# Patient Record
Sex: Female | Born: 1985
Health system: Southern US, Community
[De-identification: ages and names within clinical notes are randomized; demographics above are authoritative.]

## PROBLEM LIST (undated history)

## (undated) DIAGNOSIS — G35D Multiple sclerosis, unspecified: Secondary | ICD-10-CM

## (undated) DIAGNOSIS — G35 Multiple sclerosis: Secondary | ICD-10-CM

## (undated) DIAGNOSIS — G43909 Migraine, unspecified, not intractable, without status migrainosus: Secondary | ICD-10-CM

## (undated) DIAGNOSIS — T8859XA Other complications of anesthesia, initial encounter: Secondary | ICD-10-CM

## (undated) DIAGNOSIS — Z21 Asymptomatic human immunodeficiency virus [HIV] infection status: Secondary | ICD-10-CM

## (undated) DIAGNOSIS — O139 Gestational [pregnancy-induced] hypertension without significant proteinuria, unspecified trimester: Secondary | ICD-10-CM

## (undated) DIAGNOSIS — F329 Major depressive disorder, single episode, unspecified: Secondary | ICD-10-CM

## (undated) DIAGNOSIS — Z8669 Personal history of other diseases of the nervous system and sense organs: Secondary | ICD-10-CM

## (undated) DIAGNOSIS — F32A Depression, unspecified: Secondary | ICD-10-CM

## (undated) DIAGNOSIS — R519 Headache, unspecified: Secondary | ICD-10-CM

## (undated) DIAGNOSIS — B009 Herpesviral infection, unspecified: Secondary | ICD-10-CM

## (undated) DIAGNOSIS — R51 Headache: Secondary | ICD-10-CM

## (undated) DIAGNOSIS — B20 Human immunodeficiency virus [HIV] disease: Secondary | ICD-10-CM

## (undated) DIAGNOSIS — T4145XA Adverse effect of unspecified anesthetic, initial encounter: Secondary | ICD-10-CM

## (undated) HISTORY — DX: Other complications of anesthesia, initial encounter: T88.59XA

## (undated) HISTORY — DX: Adverse effect of unspecified anesthetic, initial encounter: T41.45XA

## (undated) HISTORY — DX: Migraine, unspecified, not intractable, without status migrainosus: G43.909

## (undated) HISTORY — DX: Depression, unspecified: F32.A

## (undated) HISTORY — PX: TUBAL LIGATION: SHX77

## (undated) HISTORY — PX: BREAST SURGERY: SHX581

## (undated) HISTORY — DX: Major depressive disorder, single episode, unspecified: F32.9

## (undated) HISTORY — DX: Herpesviral infection, unspecified: B00.9

## (undated) HISTORY — DX: Gestational (pregnancy-induced) hypertension without significant proteinuria, unspecified trimester: O13.9

## (undated) HISTORY — DX: Personal history of other diseases of the nervous system and sense organs: Z86.69

---
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
## 2003-02-08 ENCOUNTER — Encounter: Admission: RE | Admit: 2003-02-08 | Discharge: 2003-02-08 | Payer: Self-pay | Admitting: Family Medicine

## 2003-03-09 ENCOUNTER — Encounter: Admission: RE | Admit: 2003-03-09 | Discharge: 2003-03-09 | Payer: Self-pay | Admitting: Obstetrics and Gynecology

## 2003-03-10 ENCOUNTER — Ambulatory Visit (HOSPITAL_COMMUNITY): Admission: RE | Admit: 2003-03-10 | Discharge: 2003-03-10 | Payer: Self-pay | Admitting: Obstetrics and Gynecology

## 2003-04-03 ENCOUNTER — Encounter (INDEPENDENT_AMBULATORY_CARE_PROVIDER_SITE_OTHER): Payer: Self-pay | Admitting: Specialist

## 2003-04-03 ENCOUNTER — Ambulatory Visit (HOSPITAL_COMMUNITY): Admission: RE | Admit: 2003-04-03 | Discharge: 2003-04-03 | Payer: Self-pay | Admitting: Obstetrics and Gynecology

## 2003-04-25 ENCOUNTER — Encounter: Admission: RE | Admit: 2003-04-25 | Discharge: 2003-04-25 | Payer: Self-pay | Admitting: Obstetrics and Gynecology

## 2003-06-24 HISTORY — PX: IUD REMOVAL: SHX5392

## 2003-08-05 ENCOUNTER — Emergency Department (HOSPITAL_COMMUNITY): Admission: AD | Admit: 2003-08-05 | Discharge: 2003-08-05 | Payer: Self-pay | Admitting: Family Medicine

## 2003-12-08 ENCOUNTER — Emergency Department (HOSPITAL_COMMUNITY): Admission: EM | Admit: 2003-12-08 | Discharge: 2003-12-08 | Payer: Self-pay | Admitting: Emergency Medicine

## 2004-06-28 ENCOUNTER — Emergency Department: Payer: Self-pay | Admitting: Internal Medicine

## 2004-12-07 ENCOUNTER — Emergency Department (HOSPITAL_COMMUNITY): Admission: EM | Admit: 2004-12-07 | Discharge: 2004-12-07 | Payer: Self-pay | Admitting: Emergency Medicine

## 2005-10-22 ENCOUNTER — Emergency Department (HOSPITAL_COMMUNITY): Admission: EM | Admit: 2005-10-22 | Discharge: 2005-10-22 | Payer: Self-pay | Admitting: Family Medicine

## 2005-10-24 ENCOUNTER — Other Ambulatory Visit: Admission: RE | Admit: 2005-10-24 | Discharge: 2005-10-24 | Payer: Self-pay | Admitting: Family Medicine

## 2005-10-24 ENCOUNTER — Ambulatory Visit: Payer: Self-pay | Admitting: Family Medicine

## 2005-10-29 ENCOUNTER — Ambulatory Visit: Payer: Self-pay | Admitting: Family Medicine

## 2005-10-30 ENCOUNTER — Encounter: Admission: RE | Admit: 2005-10-30 | Discharge: 2005-10-30 | Payer: Self-pay | Admitting: Family Medicine

## 2005-12-13 ENCOUNTER — Emergency Department (HOSPITAL_COMMUNITY): Admission: EM | Admit: 2005-12-13 | Discharge: 2005-12-13 | Payer: Self-pay | Admitting: Emergency Medicine

## 2006-08-20 DIAGNOSIS — E669 Obesity, unspecified: Secondary | ICD-10-CM | POA: Insufficient documentation

## 2006-08-20 DIAGNOSIS — E739 Lactose intolerance, unspecified: Secondary | ICD-10-CM | POA: Insufficient documentation

## 2006-08-21 ENCOUNTER — Encounter (INDEPENDENT_AMBULATORY_CARE_PROVIDER_SITE_OTHER): Payer: Self-pay | Admitting: *Deleted

## 2006-12-04 ENCOUNTER — Telehealth: Payer: Self-pay | Admitting: *Deleted

## 2006-12-10 ENCOUNTER — Encounter (INDEPENDENT_AMBULATORY_CARE_PROVIDER_SITE_OTHER): Payer: Self-pay | Admitting: Family Medicine

## 2006-12-10 ENCOUNTER — Ambulatory Visit: Payer: Self-pay | Admitting: Sports Medicine

## 2006-12-10 LAB — CONVERTED CEMR LAB
Antibody Screen: NEGATIVE
Beta hcg, urine, semiquantitative: POSITIVE
Eosinophils Relative: 2 % (ref 0–5)
Hemoglobin: 12.3 g/dL (ref 12.0–15.0)
Hepatitis B Surface Ag: NEGATIVE
Lymphs Abs: 2.5 10*3/uL (ref 0.7–3.3)
MCHC: 33.1 g/dL (ref 30.0–36.0)
Monocytes Absolute: 0.7 10*3/uL (ref 0.2–0.7)
Monocytes Relative: 7 % (ref 3–11)
RDW: 15 % — ABNORMAL HIGH (ref 11.5–14.0)
Rubella: 68.9 intl units/mL — ABNORMAL HIGH
WBC: 10.9 10*3/uL — ABNORMAL HIGH (ref 4.0–10.5)

## 2006-12-30 ENCOUNTER — Encounter (INDEPENDENT_AMBULATORY_CARE_PROVIDER_SITE_OTHER): Payer: Self-pay | Admitting: Family Medicine

## 2006-12-30 ENCOUNTER — Ambulatory Visit: Payer: Self-pay | Admitting: Family Medicine

## 2006-12-30 LAB — CONVERTED CEMR LAB: Chlamydia, DNA Probe: NEGATIVE

## 2007-01-06 ENCOUNTER — Encounter: Payer: Self-pay | Admitting: Family Medicine

## 2007-01-06 ENCOUNTER — Ambulatory Visit (HOSPITAL_COMMUNITY): Admission: RE | Admit: 2007-01-06 | Discharge: 2007-01-06 | Payer: Self-pay | Admitting: Family Medicine

## 2007-01-27 ENCOUNTER — Ambulatory Visit: Payer: Self-pay

## 2007-01-27 LAB — CONVERTED CEMR LAB
Glucose, Urine, Semiquant: NEGATIVE
Protein, U semiquant: NEGATIVE

## 2007-03-04 ENCOUNTER — Ambulatory Visit (HOSPITAL_COMMUNITY): Admission: RE | Admit: 2007-03-04 | Discharge: 2007-03-04 | Payer: Self-pay | Admitting: Family Medicine

## 2007-03-04 ENCOUNTER — Ambulatory Visit: Payer: Self-pay | Admitting: Family Medicine

## 2007-03-05 ENCOUNTER — Encounter (INDEPENDENT_AMBULATORY_CARE_PROVIDER_SITE_OTHER): Payer: Self-pay | Admitting: Family Medicine

## 2007-03-23 ENCOUNTER — Ambulatory Visit: Payer: Self-pay | Admitting: Family Medicine

## 2007-04-05 ENCOUNTER — Ambulatory Visit: Payer: Self-pay | Admitting: Family Medicine

## 2007-04-08 ENCOUNTER — Encounter (INDEPENDENT_AMBULATORY_CARE_PROVIDER_SITE_OTHER): Payer: Self-pay | Admitting: Family Medicine

## 2007-04-08 ENCOUNTER — Ambulatory Visit: Payer: Self-pay | Admitting: Family Medicine

## 2007-04-16 ENCOUNTER — Inpatient Hospital Stay (HOSPITAL_COMMUNITY): Admission: AD | Admit: 2007-04-16 | Discharge: 2007-04-17 | Payer: Self-pay | Admitting: Obstetrics & Gynecology

## 2007-04-16 ENCOUNTER — Ambulatory Visit: Payer: Self-pay | Admitting: Obstetrics and Gynecology

## 2007-04-29 ENCOUNTER — Ambulatory Visit: Payer: Self-pay | Admitting: Family Medicine

## 2007-04-29 LAB — CONVERTED CEMR LAB

## 2007-05-04 ENCOUNTER — Encounter (INDEPENDENT_AMBULATORY_CARE_PROVIDER_SITE_OTHER): Payer: Self-pay | Admitting: Family Medicine

## 2007-05-11 ENCOUNTER — Encounter (INDEPENDENT_AMBULATORY_CARE_PROVIDER_SITE_OTHER): Payer: Self-pay | Admitting: Family Medicine

## 2007-05-11 ENCOUNTER — Ambulatory Visit: Payer: Self-pay | Admitting: Family Medicine

## 2007-05-26 ENCOUNTER — Ambulatory Visit: Payer: Self-pay | Admitting: Surgery

## 2007-05-26 ENCOUNTER — Ambulatory Visit: Payer: Self-pay | Admitting: Family Medicine

## 2007-05-26 ENCOUNTER — Ambulatory Visit (HOSPITAL_COMMUNITY): Admission: RE | Admit: 2007-05-26 | Discharge: 2007-05-26 | Payer: Self-pay | Admitting: Sports Medicine

## 2007-05-26 ENCOUNTER — Encounter (INDEPENDENT_AMBULATORY_CARE_PROVIDER_SITE_OTHER): Payer: Self-pay | Admitting: *Deleted

## 2007-05-27 ENCOUNTER — Encounter (INDEPENDENT_AMBULATORY_CARE_PROVIDER_SITE_OTHER): Payer: Self-pay | Admitting: *Deleted

## 2007-05-27 LAB — CONVERTED CEMR LAB
BUN: 4 mg/dL — ABNORMAL LOW (ref 6–23)
CO2: 20 meq/L (ref 19–32)
Calcium: 9.1 mg/dL (ref 8.4–10.5)
Chloride: 105 meq/L (ref 96–112)
Creatinine, Ser: 0.5 mg/dL (ref 0.40–1.20)
Glucose, Bld: 72 mg/dL (ref 70–99)
Potassium: 4.1 meq/L (ref 3.5–5.3)

## 2007-06-03 ENCOUNTER — Encounter: Payer: Self-pay | Admitting: Family Medicine

## 2007-06-03 ENCOUNTER — Ambulatory Visit: Payer: Self-pay | Admitting: Family Medicine

## 2007-06-03 LAB — CONVERTED CEMR LAB
Basophils Absolute: 0 10*3/uL (ref 0.0–0.1)
Basophils Relative: 0 % (ref 0–1)
Eosinophils Absolute: 0 10*3/uL — ABNORMAL LOW (ref 0.2–0.7)
Eosinophils Relative: 0 % (ref 0–5)
Glucose, Urine, Semiquant: NEGATIVE
MCV: 83.5 fL (ref 78.0–100.0)
Monocytes Absolute: 1.1 10*3/uL — ABNORMAL HIGH (ref 0.1–1.0)
Monocytes Relative: 9 % (ref 3–12)
Neutrophils Relative %: 71 % (ref 43–77)
RDW: 15.3 % (ref 11.5–15.5)
WBC: 12.1 10*3/uL — ABNORMAL HIGH (ref 4.0–10.5)

## 2007-06-09 ENCOUNTER — Encounter (INDEPENDENT_AMBULATORY_CARE_PROVIDER_SITE_OTHER): Payer: Self-pay | Admitting: Family Medicine

## 2007-06-09 ENCOUNTER — Ambulatory Visit: Payer: Self-pay | Admitting: Family Medicine

## 2007-06-14 ENCOUNTER — Encounter (INDEPENDENT_AMBULATORY_CARE_PROVIDER_SITE_OTHER): Payer: Self-pay | Admitting: Family Medicine

## 2007-06-14 ENCOUNTER — Ambulatory Visit: Payer: Self-pay | Admitting: Family Medicine

## 2007-06-14 LAB — CONVERTED CEMR LAB
Blood Glucose, Fingerstick: 114
Protein, U semiquant: NEGATIVE

## 2007-06-18 ENCOUNTER — Ambulatory Visit: Payer: Self-pay | Admitting: Gynecology

## 2007-06-18 ENCOUNTER — Inpatient Hospital Stay (HOSPITAL_COMMUNITY): Admission: AD | Admit: 2007-06-18 | Discharge: 2007-06-18 | Payer: Self-pay | Admitting: Gynecology

## 2007-06-22 ENCOUNTER — Ambulatory Visit: Payer: Self-pay | Admitting: Family Medicine

## 2007-06-22 LAB — CONVERTED CEMR LAB: Blood Glucose, AC Bkfst: 99 mg/dL

## 2007-06-28 ENCOUNTER — Encounter: Payer: Self-pay | Admitting: Family Medicine

## 2007-06-28 ENCOUNTER — Encounter (INDEPENDENT_AMBULATORY_CARE_PROVIDER_SITE_OTHER): Payer: Self-pay | Admitting: Family Medicine

## 2007-06-28 DIAGNOSIS — B009 Herpesviral infection, unspecified: Secondary | ICD-10-CM | POA: Insufficient documentation

## 2007-06-28 HISTORY — DX: Herpesviral infection, unspecified: B00.9

## 2007-06-29 ENCOUNTER — Telehealth (INDEPENDENT_AMBULATORY_CARE_PROVIDER_SITE_OTHER): Payer: Self-pay | Admitting: *Deleted

## 2007-07-01 ENCOUNTER — Encounter (INDEPENDENT_AMBULATORY_CARE_PROVIDER_SITE_OTHER): Payer: Self-pay | Admitting: Family Medicine

## 2007-07-01 ENCOUNTER — Ambulatory Visit: Payer: Self-pay | Admitting: Family Medicine

## 2007-07-01 LAB — CONVERTED CEMR LAB
GC Culture Only: NEGATIVE
GC Probe Amp, Genital: NEGATIVE
Glucose, Urine, Semiquant: NEGATIVE

## 2007-07-02 ENCOUNTER — Telehealth (INDEPENDENT_AMBULATORY_CARE_PROVIDER_SITE_OTHER): Payer: Self-pay | Admitting: Family Medicine

## 2007-07-08 ENCOUNTER — Inpatient Hospital Stay (HOSPITAL_COMMUNITY): Admission: AD | Admit: 2007-07-08 | Discharge: 2007-07-08 | Payer: Self-pay | Admitting: Obstetrics & Gynecology

## 2007-07-09 ENCOUNTER — Ambulatory Visit (HOSPITAL_COMMUNITY): Admission: RE | Admit: 2007-07-09 | Discharge: 2007-07-09 | Payer: Self-pay | Admitting: Family Medicine

## 2007-07-09 ENCOUNTER — Ambulatory Visit: Payer: Self-pay | Admitting: Family Medicine

## 2007-07-12 ENCOUNTER — Telehealth: Payer: Self-pay | Admitting: *Deleted

## 2007-07-13 LAB — CONVERTED CEMR LAB: Strep B culture: NEGATIVE

## 2007-07-15 ENCOUNTER — Encounter: Payer: Self-pay | Admitting: *Deleted

## 2007-07-15 ENCOUNTER — Ambulatory Visit: Payer: Self-pay | Admitting: Family Medicine

## 2007-07-15 ENCOUNTER — Telehealth (INDEPENDENT_AMBULATORY_CARE_PROVIDER_SITE_OTHER): Payer: Self-pay | Admitting: Family Medicine

## 2007-07-16 ENCOUNTER — Encounter: Payer: Self-pay | Admitting: *Deleted

## 2007-07-18 ENCOUNTER — Inpatient Hospital Stay (HOSPITAL_COMMUNITY): Admission: AD | Admit: 2007-07-18 | Discharge: 2007-07-18 | Payer: Self-pay | Admitting: Obstetrics & Gynecology

## 2007-07-19 ENCOUNTER — Encounter: Payer: Self-pay | Admitting: Family Medicine

## 2007-07-23 ENCOUNTER — Ambulatory Visit: Payer: Self-pay | Admitting: Family Medicine

## 2007-07-25 ENCOUNTER — Ambulatory Visit: Payer: Self-pay | Admitting: Obstetrics & Gynecology

## 2007-07-25 ENCOUNTER — Inpatient Hospital Stay (HOSPITAL_COMMUNITY): Admission: AD | Admit: 2007-07-25 | Discharge: 2007-07-28 | Payer: Self-pay | Admitting: Obstetrics & Gynecology

## 2007-08-03 ENCOUNTER — Telehealth: Payer: Self-pay | Admitting: *Deleted

## 2007-09-10 ENCOUNTER — Ambulatory Visit: Payer: Self-pay | Admitting: Family Medicine

## 2007-09-10 LAB — CONVERTED CEMR LAB: Beta hcg, urine, semiquantitative: NEGATIVE

## 2007-09-29 ENCOUNTER — Encounter: Payer: Self-pay | Admitting: Family Medicine

## 2007-10-12 ENCOUNTER — Ambulatory Visit: Payer: Self-pay | Admitting: Family Medicine

## 2007-10-12 ENCOUNTER — Encounter (INDEPENDENT_AMBULATORY_CARE_PROVIDER_SITE_OTHER): Payer: Self-pay | Admitting: Family Medicine

## 2007-10-12 LAB — CONVERTED CEMR LAB
Beta hcg, urine, semiquantitative: NEGATIVE
Whiff Test: NEGATIVE

## 2007-10-14 ENCOUNTER — Encounter (INDEPENDENT_AMBULATORY_CARE_PROVIDER_SITE_OTHER): Payer: Self-pay | Admitting: Family Medicine

## 2007-10-14 LAB — CONVERTED CEMR LAB
Chlamydia, DNA Probe: NEGATIVE
GC Probe Amp, Genital: NEGATIVE

## 2007-10-25 ENCOUNTER — Ambulatory Visit: Payer: Self-pay | Admitting: Family Medicine

## 2007-10-25 ENCOUNTER — Telehealth: Payer: Self-pay | Admitting: *Deleted

## 2007-11-01 ENCOUNTER — Telehealth: Payer: Self-pay | Admitting: *Deleted

## 2007-11-04 ENCOUNTER — Encounter (INDEPENDENT_AMBULATORY_CARE_PROVIDER_SITE_OTHER): Payer: Self-pay | Admitting: *Deleted

## 2007-11-04 ENCOUNTER — Ambulatory Visit: Payer: Self-pay

## 2007-11-25 ENCOUNTER — Encounter: Payer: Self-pay | Admitting: Family Medicine

## 2007-12-29 IMAGING — US US OB COMP LESS 14 WK
1 series · 14 of 17 positions shown · non-contrast
Comparison: none

OBSTETRICAL ULTRASOUND:

 This ultrasound exam was performed in the [HOSPITAL] Ultrasound Department.  The OB US report was generated in the AS system, and faxed to the ordering physician.  This report is also available in [REDACTED] PACS.

[Series 1: us ob comp less 14 wk · 0.28mm/px · 14 of 17 slices shown]
[im 1/17]
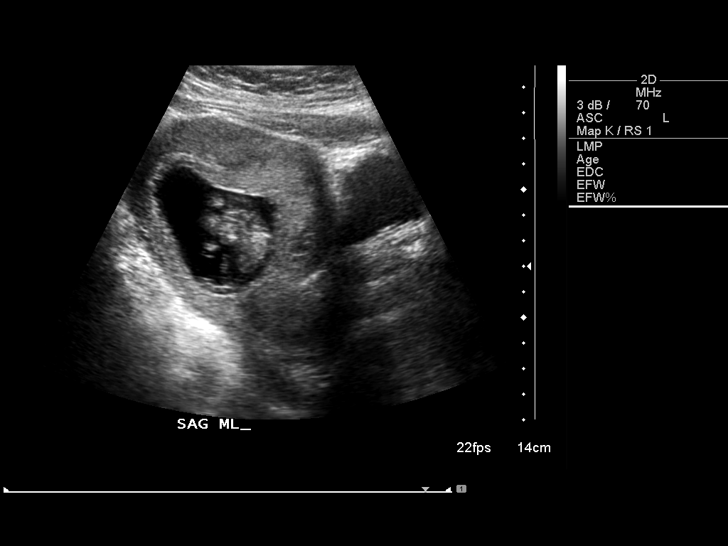
[im 2/17]
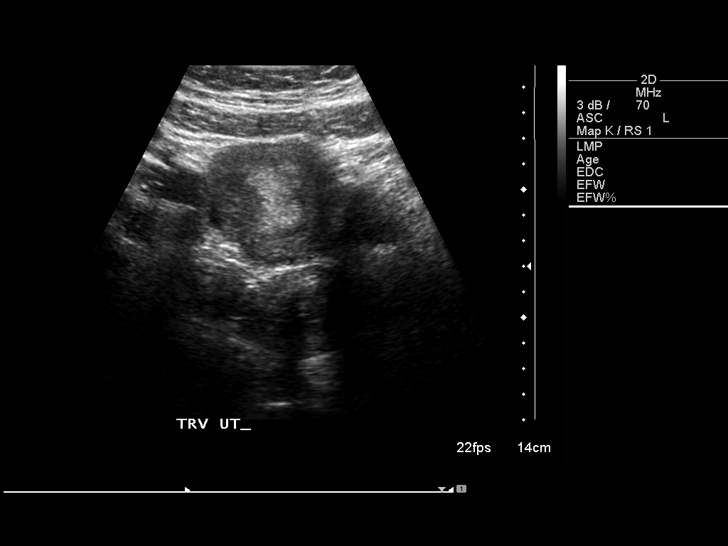
[im 4/17]
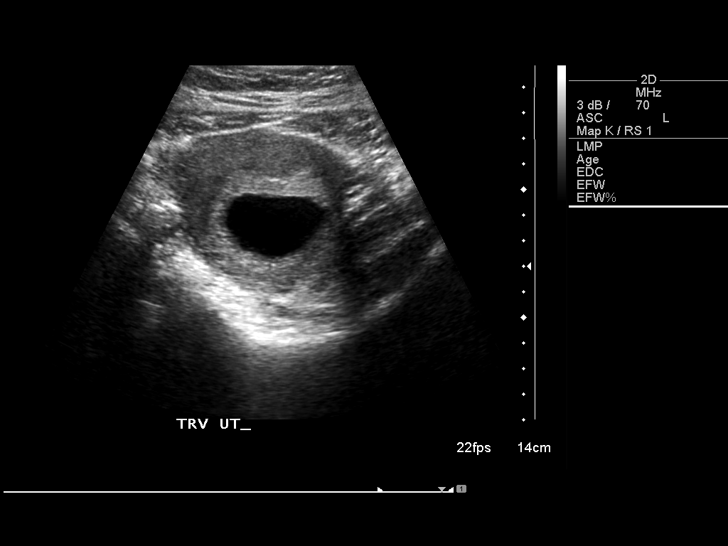
[im 5/17]
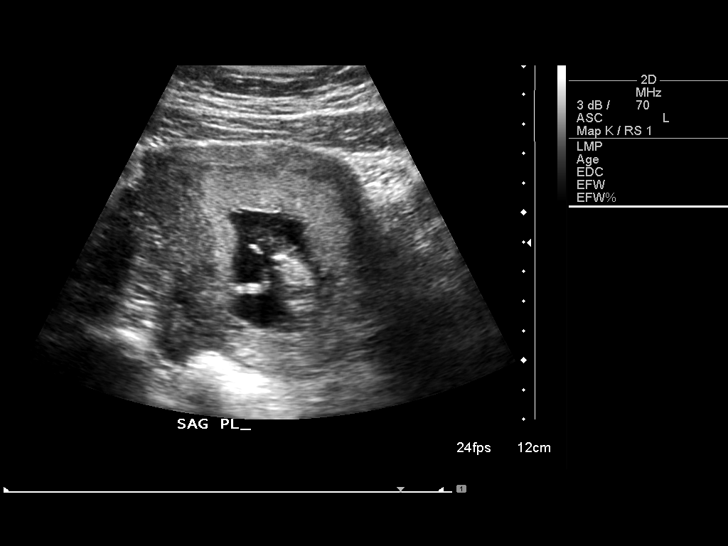
[im 6/17]
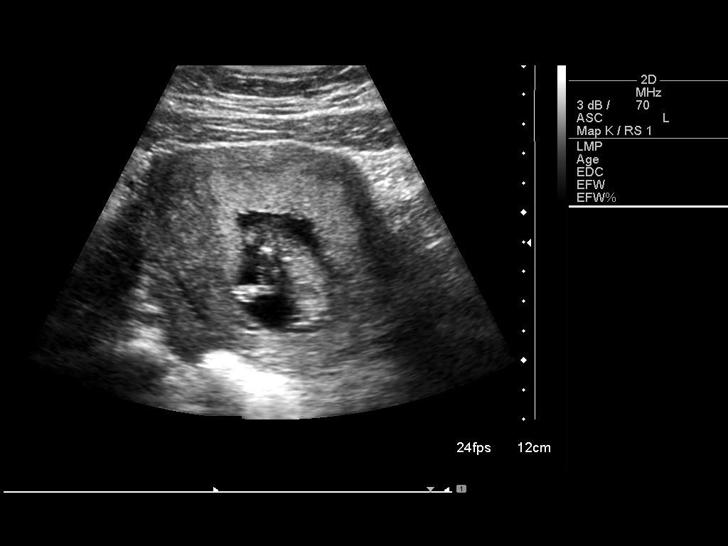
[im 7/17]
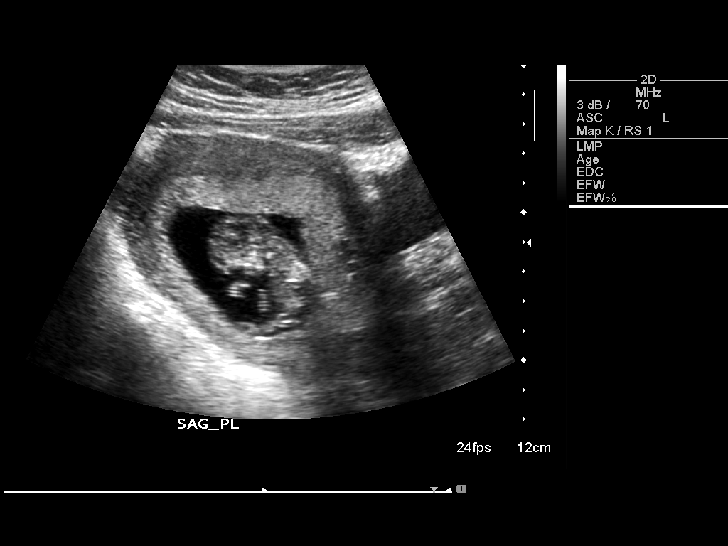
[im 8/17]
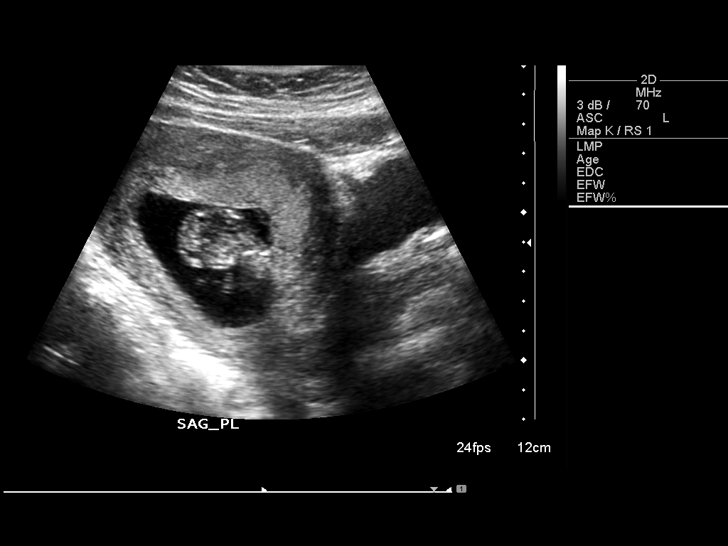
[im 10/17]
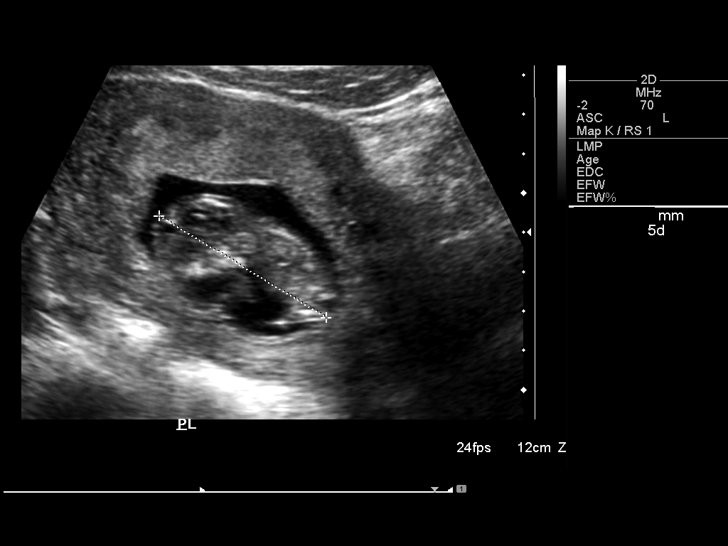
[im 11/17]
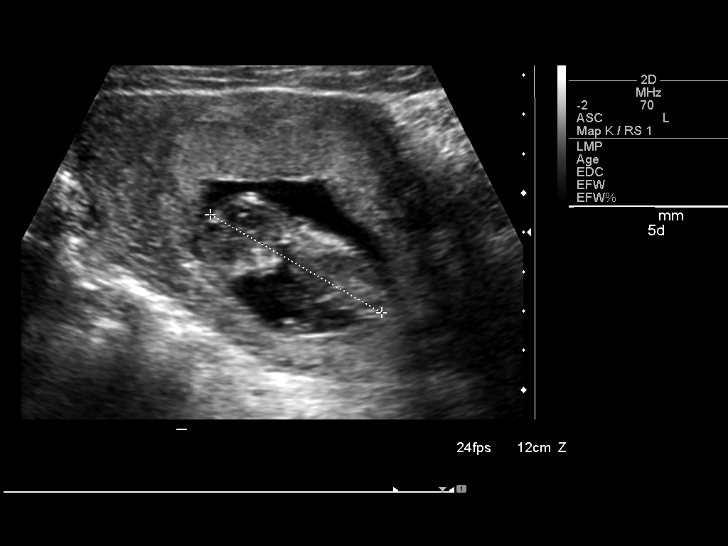
[im 12/17]
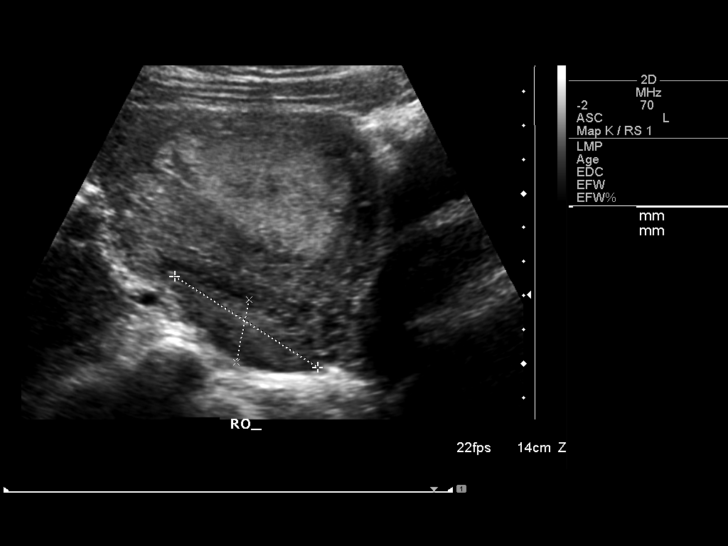
[im 13/17]
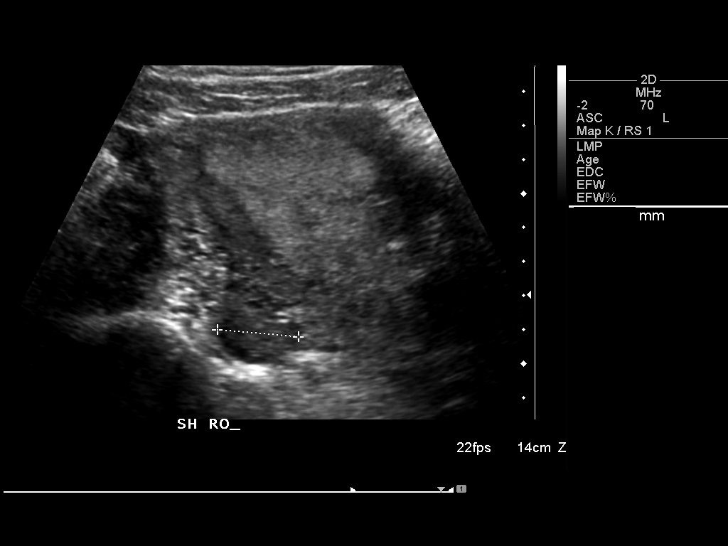
[im 14/17]
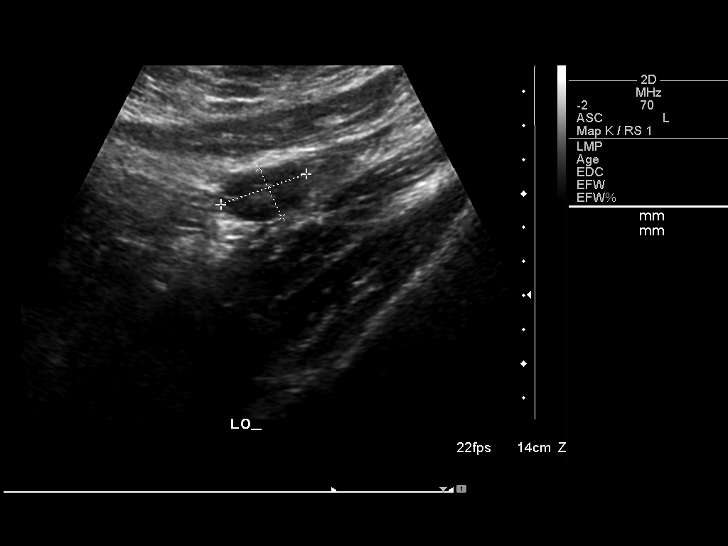
[im 16/17]
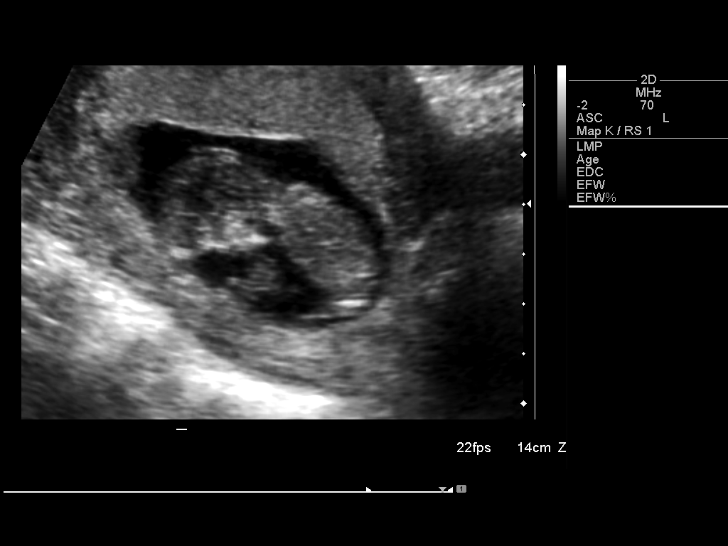
[im 17/17]
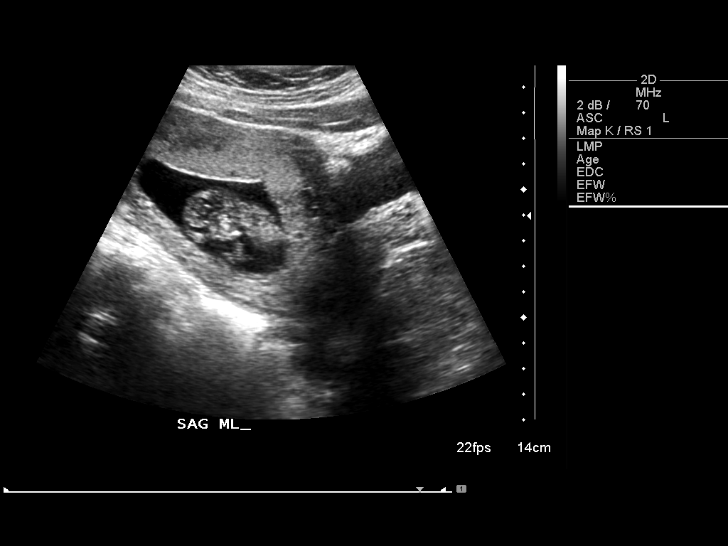

[14 of 17 positions shown; findings below may reference images not displayed]

IMPRESSION: See AS Obstetric US report.

## 2008-05-05 ENCOUNTER — Telehealth: Payer: Self-pay | Admitting: *Deleted

## 2008-09-29 ENCOUNTER — Encounter: Payer: Self-pay | Admitting: Family Medicine

## 2008-09-29 ENCOUNTER — Ambulatory Visit: Payer: Self-pay | Admitting: Family Medicine

## 2008-09-29 ENCOUNTER — Other Ambulatory Visit: Admission: RE | Admit: 2008-09-29 | Discharge: 2008-09-29 | Payer: Self-pay | Admitting: Family Medicine

## 2008-09-29 LAB — CONVERTED CEMR LAB
Cholesterol: 169 mg/dL (ref 0–200)
GC Probe Amp, Genital: NEGATIVE

## 2008-10-02 ENCOUNTER — Encounter: Payer: Self-pay | Admitting: Family Medicine

## 2008-10-03 ENCOUNTER — Encounter: Payer: Self-pay | Admitting: Family Medicine

## 2008-10-12 ENCOUNTER — Encounter: Payer: Self-pay | Admitting: Family Medicine

## 2009-03-31 ENCOUNTER — Encounter (INDEPENDENT_AMBULATORY_CARE_PROVIDER_SITE_OTHER): Payer: Self-pay | Admitting: *Deleted

## 2009-03-31 DIAGNOSIS — Z720 Tobacco use: Secondary | ICD-10-CM | POA: Insufficient documentation

## 2009-03-31 DIAGNOSIS — F17200 Nicotine dependence, unspecified, uncomplicated: Secondary | ICD-10-CM | POA: Insufficient documentation

## 2009-05-02 ENCOUNTER — Ambulatory Visit: Payer: Self-pay | Admitting: Family Medicine

## 2009-05-02 ENCOUNTER — Encounter: Payer: Self-pay | Admitting: Family Medicine

## 2009-05-02 LAB — CONVERTED CEMR LAB: Whiff Test: POSITIVE

## 2009-05-04 ENCOUNTER — Encounter: Payer: Self-pay | Admitting: Family Medicine

## 2009-05-15 ENCOUNTER — Telehealth: Payer: Self-pay | Admitting: *Deleted

## 2009-07-05 ENCOUNTER — Telehealth: Payer: Self-pay | Admitting: Family Medicine

## 2009-07-06 ENCOUNTER — Encounter: Payer: Self-pay | Admitting: Family Medicine

## 2009-07-06 ENCOUNTER — Ambulatory Visit: Payer: Self-pay | Admitting: Family Medicine

## 2009-07-06 LAB — CONVERTED CEMR LAB: Uric Acid, Serum: 4 mg/dL (ref 2.4–7.0)

## 2009-07-11 ENCOUNTER — Ambulatory Visit: Payer: Self-pay | Admitting: Family Medicine

## 2009-07-11 ENCOUNTER — Encounter: Payer: Self-pay | Admitting: Family Medicine

## 2009-07-11 ENCOUNTER — Encounter: Admission: RE | Admit: 2009-07-11 | Discharge: 2009-07-11 | Payer: Self-pay | Admitting: Family Medicine

## 2009-07-11 ENCOUNTER — Telehealth: Payer: Self-pay | Admitting: Family Medicine

## 2009-07-11 LAB — CONVERTED CEMR LAB
ALT: 12 units/L (ref 0–35)
Albumin: 4.4 g/dL (ref 3.5–5.2)
Anti Nuclear Antibody(ANA): NEGATIVE
BUN: 14 mg/dL (ref 6–23)
Basophils Absolute: 0 10*3/uL (ref 0.0–0.1)
Calcium: 9.4 mg/dL (ref 8.4–10.5)
Eosinophils Absolute: 0.1 10*3/uL (ref 0.0–0.7)
Glucose, Bld: 76 mg/dL (ref 70–99)
Hemoglobin: 13.3 g/dL (ref 12.0–15.0)
Lymphocytes Relative: 28 % (ref 12–46)
Lymphs Abs: 2.9 10*3/uL (ref 0.7–4.0)
MCHC: 34.3 g/dL (ref 30.0–36.0)
MCV: 83.1 fL (ref 78.0–100.0)
Monocytes Relative: 3 % (ref 3–12)
Neutro Abs: 6.9 10*3/uL (ref 1.7–7.7)
Neutrophils Relative %: 68 % (ref 43–77)
RBC: 4.67 M/uL (ref 3.87–5.11)
RDW: 15.4 % (ref 11.5–15.5)
Total Bilirubin: 0.4 mg/dL (ref 0.3–1.2)
Total Protein: 7.2 g/dL (ref 6.0–8.3)
WBC: 10.1 10*3/uL (ref 4.0–10.5)

## 2009-07-12 ENCOUNTER — Ambulatory Visit: Payer: Self-pay | Admitting: Family Medicine

## 2009-07-13 ENCOUNTER — Telehealth (INDEPENDENT_AMBULATORY_CARE_PROVIDER_SITE_OTHER): Payer: Self-pay | Admitting: Family Medicine

## 2009-07-13 ENCOUNTER — Ambulatory Visit: Payer: Self-pay | Admitting: Family Medicine

## 2009-07-16 ENCOUNTER — Encounter: Payer: Self-pay | Admitting: Family Medicine

## 2009-07-18 ENCOUNTER — Telehealth: Payer: Self-pay | Admitting: Family Medicine

## 2009-08-22 ENCOUNTER — Ambulatory Visit: Payer: Self-pay | Admitting: Family Medicine

## 2009-09-03 ENCOUNTER — Encounter: Payer: Self-pay | Admitting: Family Medicine

## 2009-12-04 ENCOUNTER — Encounter: Payer: Self-pay | Admitting: Family Medicine

## 2010-01-29 ENCOUNTER — Encounter: Payer: Self-pay | Admitting: Family Medicine

## 2010-01-29 ENCOUNTER — Ambulatory Visit: Payer: Self-pay | Admitting: Family Medicine

## 2010-01-29 LAB — CONVERTED CEMR LAB
Beta hcg, urine, semiquantitative: NEGATIVE
Blood in Urine, dipstick: NEGATIVE
Chlamydia, DNA Probe: NEGATIVE
Ketones, urine, test strip: NEGATIVE
Nitrite: NEGATIVE
Specific Gravity, Urine: 1.02
Urobilinogen, UA: 0.2
WBC Urine, dipstick: NEGATIVE
Whiff Test: POSITIVE

## 2010-02-01 ENCOUNTER — Encounter: Admission: RE | Admit: 2010-02-01 | Discharge: 2010-02-01 | Payer: Self-pay | Admitting: Family Medicine

## 2010-02-04 ENCOUNTER — Encounter: Payer: Self-pay | Admitting: Family Medicine

## 2010-05-03 ENCOUNTER — Encounter: Payer: Self-pay | Admitting: Family Medicine
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
## 2010-07-23 NOTE — Assessment & Plan Note (Signed)
Summary: stomach pain,tcb   Vital Signs:  Patient profile:   25 year old female Height:      62.75 inches Weight:      184 pounds BMI:     32.97 Temp:     98.3 degrees F oral Pulse rate:   77 / minute BP sitting:   113 / 85  (right arm) Cuff size:   regular  Vitals Entered By: Tessie Fass CMA (January 29, 2010 10:30 AM) CC: lower back and abdominal pain. polyuria Is Patient Diabetic? No Pain Assessment Patient in pain? no        Primary Care Provider:  Geovany Trudo MD  CC:  lower back and abdominal pain. polyuria.  History of Present Illness: 25 y/o F here for   ABDOMINAL PAIN Location: flank, pelvic, lower back Onset: 2 wks ago after sexual intercourse, then she noticed malodoerous vaginal discharge. Vag discharge stopped, but then she started having flank, pelvic, and low back pain.  Used condom at that time.  LMP 12/30/09 Description: sharp in pelvic, back feels dulll.   comes and goes. tylenol did not help Modifying factors:   Symptoms Nausea/Vomiting: +nausea Diarrhea: no Constipation: no Melena/BRBPR:  no Hematemesis: no Anorexia: decreased appetite present for years Fever/Chills: no Jaundice: no  Dysuria: no Back pain: yes, lower back Rash: no   Weight loss: no Vaginal bleeding: no STD exposure:  LMP: Mid July Alcohol use: occasionally NSAID use: just tylenol  PMH: genital herpes has valtrex as needed  Past Surgeries:  c/s x 1 07/2007    Habits & Providers  Alcohol-Tobacco-Diet     Tobacco Status: current     Tobacco Counseling: to quit use of tobacco products     Cigarette Packs/Day: 0.25  Current Medications (verified): 1)  Valtrex 500 Mg Tabs (Valacyclovir Hcl) .Marland Kitchen.. 1 Tab By Mouth Q 12 Hours X 3 Days.  Allergies (verified): No Known Drug Allergies  Past History:  Past Medical History: 3/01 - PID, 3/03 referred for breast reduction eval,  4/04- myppaic astigmatism, b/l eye pain- use salin,  6/03 - IUD placed,  7/03- injury to R eye      Past Surgical History: Reviewed history from 11/04/2007 and no changes required. s/p LTCS x 1 2009  Review of Systems       per hpi   Physical Exam  General:  Well-developed,well-nourished,in no acute distress; alert,appropriate and cooperative throughout examination. vitals reviewed.  Abdomen:  soft, normal bowel sounds, no distention, no masses, no rigidity, no rebound tenderness, no abdominal hernia, no inguinal hernia, no hepatomegaly, no splenomegaly, guarding,   + LUQ tenderness, and L flank tenderness.  + suprapubic tenderness Genitalia:  Normal introitus for age, no external lesions, no vaginal discharge, mucosa pink and moist, no vaginal or cervical lesions, no vaginal atrophy, no friaility or hemorrhage, normal uterus size and position, no adnexal masses  + cva tenderness   Impression & Recommendations:  Problem # 1:  ABDOMINAL PAIN (ICD-789.00) UA neg.  UPT neg.  Wet prep showed clue cells and +whiff.  Pt had more tenderness on exam then expected.  +cva tenderness.  Will get ultrasound.   Orders: Urinalysis-[facility] (00000) GC/Chlamydia-[facility] (87591/87491) Ultrasound (Ultrasound) [facility]- Est  Level 4 (82956)  Problem # 2:  Gynecological examination-routine (ICD-V72.31) Pelvic exam was necessary as part of exam for abd pain.  Because pt had c/o vaginal discharge and pain occuring after unprotected sexual intercourse GC/Chlam was also necessary.  Given that and pap being due, I opted  to take pap sample during this appt also.    Problem # 3:  VAGINAL DISCHARGE (ICD-623.5) Wet prep done.  +clue cells, +whiff.  will treat with flagyl 500mg  two times a day x 7 days.   Orders: Wet Prep- [facility] (91478) [facility]- Est  Level 4 (29562)  Complete Medication List: 1)  Valtrex 500 Mg Tabs (Valacyclovir hcl) .Marland Kitchen.. 1 tab by mouth q 12 hours x 3 days. 2)  Flagyl 500 Mg Tabs (Metronidazole) .Marland Kitchen.. 1 tab by mouth two times a day x 7 days  Other Orders: U Preg-[facility] (13086) Pap Smear-[facility]  ([id number])  Patient Instructions: 1)  I will call you with the result of your tests today. 2)  Also, I will call with ultrasound result after I have received them. 3)  We can discuss what to do next at that time.  Prescriptions: FLAGYL 500 MG TABS (METRONIDAZOLE) 1 tab by mouth two times a day x 7 days  #14 x 0   Entered and Authorized by:   Angeline Slim MD   Signed by:   Angeline Slim MD on 01/29/2010   Method used:   Electronically to        [facility] [address] [phone]* (retail)       [address]       [place], [place]  [address]       Ph: [phone]       Fax: [phone]   RxID:   [id number]   Laboratory Results   Urine Tests  Date/Time Received: January 29, 2010 10:39 AM  Date/Time Reported: January 29, 2010 10:48 AM   Routine Urinalysis   Color: yellow Appearance: Clear Glucose: negative   (Normal Range: Negative) Bilirubin: negative   (Normal Range: Negative) Ketone: negative   (Normal Range: Negative) Spec. Gravity: 1.020   (Normal Range: 1.003-1.035) Blood: negative   (Normal Range: Negative) pH: 6.5   (Normal Range: 5.0-8.0) Protein: negative   (Normal Range: Negative) Urobilinogen: 0.2   (Normal Range: 0-1) Nitrite: negative   (Normal Range: Negative) Leukocyte Esterace: negative   (Normal Range: Negative)    Urine HCG: negative Comments: ...............test performed by......Marland KitchenBonnie A. [place], MLS (ASCP)cm  Date/Time Received: January 29, 2010 12:01 PM  Date/Time Reported: January 29, 2010 12:29 PM   Wet [place] Source: vag WBC/hpf: 1-5 Bacteria/hpf: 3+  Cocci Clue cells/hpf: many  Positive whiff Yeast/hpf: none Trichomonas/hpf: none Comments: ...............test performed by......Marland KitchenBonnie A. [place], MLS (ASCP)cm      Prevention & Chronic Care Immunizations   Influenza vaccine: Fluvax 3+  (06/03/2007)   Influenza vaccine due: Not Indicated    Tetanus booster: 11/22/2002: Done.   Tetanus booster due: 11/21/2012    Pneumococcal vaccine: Not  documented  Other Screening   Pap smear: NEGATIVE FOR INTRAEPITHELIAL LESIONS OR MALIGNANCY.  (09/29/2008)   Pap smear action/deferral: Ordered  (01/29/2010)   Pap smear due: 11/22/2003   Smoking status: current  (01/29/2010)   Smoking cessation counseling: yes  (09/10/2007)   Nursing Instructions: Pap smear today

## 2010-07-23 NOTE — Assessment & Plan Note (Signed)
Summary: toe injury/lgk/may   Vital Signs:  Patient profile:   25 year old female Height:      62.75 inches Weight:      186 pounds BMI:     33.33 Temp:     98.5 degrees F oral Pulse rate:   80 / minute BP sitting:   120 / 79  (right arm) Cuff size:   large  Vitals Entered By: Tessie Fass CMA (July 06, 2009 8:43 AM) CC: Left toe pain Is Patient Diabetic? No Pain Assessment Patient in pain? yes     Location: foot Intensity: 8   Primary Care Provider:  Ardeen Garland  MD  CC:  Left toe pain.  History of Present Illness: Candice [name] comes in today for left toe pain.  She did call in yesterday thinking it was due to injury at work.  However, she states as she thought about it more, the injury was to her right foot, not her left.  Her right foot is feeling fine.  No know injury to left foot.  Started suddenly about 5 days ago.  It is her 4th toe on the left foot in the middle.  Swollen, very painful.  Seems to be worsening.  Now hurting to walk.  Also has been red.  No recent illness.  No rashes.  No fever.  No history of gout but she has multiple family members with gout.  Took ibuprofen (600mg ) one time yesterday with minimal relief.   Habits & Providers  Alcohol-Tobacco-Diet     Tobacco Status: current     Cigarette Packs/Day: 0.25  Allergies: No Known Drug Allergies  Physical Exam  General:  overweight, alert, NAD, cooperative to exam vitals reviewed.  Extremities:  Right foot normal. Left foot normal except: 4th toe mild to moderately swollen around PIP.  Mild erythema.  Tender to palpation of PIP.  No tenderness to palpation of DIP or MTP.  Full ROM, thought painful to flex toe.  Skin:  no rashes   Impression & Recommendations:  Problem # 1:  ARTHRITIS, LEFT FOOT, ACUTE (ICD-716.97) Assessment New Monoarticular, sudden arthritis.  History and exam is consistent with gout.  Could also consider gonococcal arthritis, trauma, RA.  However, gout most likely at this  point.  Will treat with Naprosyn 500 two times a day.  Will check uric acid level.  If does not improve, will consider xray.  If she worsens, develops rash or fever, she should return for testing for other types of arthritis.  Orders: Uric Acid-[facility] ([id number]) [facility]- Est  Level 4 (09811)  Complete Medication List: 1)  Valtrex 500 Mg Tabs (Valacyclovir hcl) .Marland Kitchen.. 1 tab by mouth q 12 hours x 3 days. 2)  Naprosyn 500 Mg Tabs (Naproxen) .Marland Kitchen.. 1 tab by mouth two times a day for 7-10 days or until pain resolved.  Patient Instructions: 1)  You may have gout.  We will draw a uric acid level today.  I should have it back by Monday.  It won't really change treatment, but it will just give [place] more information about if this is truly gout. 2)  Use the naprosyn 2 times a day until the pain is gone.  You may want to use it at least 7 days even if the pain goes away before then. 3)  If you develop a rash or fever or worsened pain, please return.  Prescriptions: NAPROSYN 500 MG TABS (NAPROXEN) 1 TAB by mouth two times a day FOR 7-10 DAYS OR UNTIL PAIN  RESOLVED.  #30 x 2   Entered and Authorized by:   Ardeen Garland  MD   Signed by:   Ardeen Garland  MD on 07/06/2009   Method used:   Print then Give to Patient   RxID:   [id number]

## 2010-07-23 NOTE — Progress Notes (Signed)
Summary: triage  Phone Note Call from Patient Call back at [phone]   Caller: Patient Summary of Call: was here last week about a foot problem, but she now has 2 new knots and wants to be seen today Initial call taken by: De Nurse,  July 11, 2009 11:03 AM  Follow-up for Phone Call        she will be here for a 3pm work in. she is aware of wait & that pcp will not be seeing her. states the antiinflamatory med has not helped at all Follow-up by: Golden Circle RN,  July 11, 2009 11:07 AM

## 2010-07-23 NOTE — Miscellaneous (Signed)
Summary: Changing prob list   Clinical Lists Changes  Problems: Removed problem of ROUTINE GYNECOLOGICAL EXAMINATION (ICD-V72.31) Removed problem of VAGINAL DISCHARGE (ICD-623.5) Removed problem of ABDOMINAL PAIN (ICD-789.00) Removed problem of CONDYLOMA ACUMINATUM (ICD-078.11) Removed problem of POLYARTHRITIS (ICD-719.49) Removed problem of CONTRACEPTIVE MANAGEMENT (ICD-V25.09)

## 2010-07-23 NOTE — Consult Note (Signed)
[facility]   Imported By: Clydell Hakim 09/11/2009 13:42:32  _____________________________________________________________________  External Attachment:    Type:   Image     Comment:   External Document

## 2010-07-23 NOTE — Consult Note (Signed)
Summary: [facility]  [facility]   Imported By: De Nurse 07/27/2009 16:57:12  _____________________________________________________________________  External Attachment:    Type:   Image     Comment:   External Document

## 2010-07-23 NOTE — Progress Notes (Signed)
Summary: triage  Phone Note Call from Patient Call back at [phone]   Caller: Patient Summary of Call: thinks she sprained her toe and needs to come in. Initial call taken by: De Nurse,  July 05, 2009 10:32 AM  Follow-up for Phone Call        saturaday had rolling machine called a  float roll over her foot, toe slowing getting worse, increase pain and swelling, advised to continue with Tylonel and elevating foot, offered apt this am but unable to come, requested apt tomorrow since off of work, give 830 with dr Georgiana Shore, stated supervisor aware of incident at work and was going to do the paperwork. Follow-up by: Gladstone Pih,  July 05, 2009 10:56 AM

## 2010-07-23 NOTE — Letter (Signed)
Summary: Results Follow-up Letter  [facility]  [address]   [place], [place] [address]   Phone: [phone]  Fax: [phone]    02/04/2010  [address] [place], [place]  [address]  Dear Ms. Marzan,   The following are the results of your recent test(s):  Test     Result     Pap Smear    Normal  Gonorrhea     Negative (normal)  Chlamydia    Negative (normal)  Abdominal Ultrasound:  Normal    Sincerely,  Cat Ta MD [facility]           Appended Document: Results Follow-up Letter mailed

## 2010-07-23 NOTE — Progress Notes (Signed)
Summary: Needs Podiatry referral  Phone Note Outgoing Call   Call placed by: Jettie Pagan MD Call placed to: Patient Reason for Call: Discuss lab or test results Summary of Call: Called patient to discuss results of lab work and Xray - all wnl.  Patient has received 3 days of IM Rocephin for presumed infectious etiology of joint swelling and pain.  At this time, cause is still unclear.  I would like to refer her to Podiatry for evaluation and treatment.  Patient verbalized understanding and agreed to plan.   Contact numbers for patient [phone] or [phone]  Follow-up for Phone Call        appointment scheduled for [facility] for 07/16/2009. patient aware. Follow-up by: Theresia Lo RN,  July 13, 2009 1:50 PM

## 2010-07-23 NOTE — Assessment & Plan Note (Signed)
Summary: injection/eo  Nurse Visit   Allergies: No Known Drug Allergies  Medication Administration  Injection # 1:    Medication: Rocephin  250mg     Diagnosis: ARTHRITIS, LEFT FOOT, ACUTE (ICD-716.97)    Route: IM    Site: LUOQ gluteus    Exp Date: 10/2011    Lot #: [id number]    Mfr: Novaplus    Comments:  Rocephin 1 gram given IM. patient waited in office 20 minutes after injection without any  problems.    Patient tolerated injection without complications    Given by: Theresia Lo RN (July 12, 2009 1:54 PM)  Orders Added: 1)  Admin of Injection (IM/SQ) [phone] 2)  Rocephin  250mg  [I4332]   Medication Administration  Injection # 1:    Medication: Rocephin  250mg     Diagnosis: ARTHRITIS, LEFT FOOT, ACUTE (ICD-716.97)    Route: IM    Site: LUOQ gluteus    Exp Date: 10/2011    Lot #: [id number]    Mfr: Novaplus    Comments:  Rocephin 1 gram given IM. patient waited in office 20 minutes after injection without any  problems.    Patient tolerated injection without complications    Given by: Theresia Lo RN (July 12, 2009 1:54 PM)  Orders Added: 1)  Admin of Injection (IM/SQ) [16606] 2)  Rocephin  250mg  [T0160]

## 2010-07-23 NOTE — Progress Notes (Signed)
Summary: triage  Phone Note Call from Patient Call back at Home Phone [phone]   Caller: Patient Summary of Call: Pt has pain shooting up both her legs. Initial call taken by: Clydell Hakim,  July 18, 2009 11:24 AM  Follow-up for Phone Call        left message Follow-up by: Golden Circle RN,  July 18, 2009 11:47 AM  Additional Follow-up for Phone Call Additional follow up Details #1::        pt returned call Additional Follow-up by: De Nurse,  July 18, 2009 11:50 AM    Additional Follow-up for Phone Call Additional follow up Details #2::    started 2 days ago. seeing md here & podiatrist about her foot. pain is on shin areas. pain is 7/10. taking ibuprofen which is not helping very much. just picked up steroid pac. told her to start taking it now. tiold her if it has not made a difference by Thursday night, call for Friday appt. she agreed with plan. said that podiatrist told her if steroids do not help, he will send her to a ortho md Follow-up by: Golden Circle RN,  July 18, 2009 11:56 AM

## 2010-07-23 NOTE — Assessment & Plan Note (Signed)
Summary: injection/eo  Nurse Visit   Allergies: No Known Drug Allergies  Medication Administration  Injection # 1:    Medication: Rocephin  250mg     Diagnosis: ARTHRITIS, LEFT FOOT, ACUTE (ICD-716.97)    Route: IM    Site: RUOQ gluteus    Exp Date: 10/2011    Lot #: [id number]    Mfr: Novaplus    Comments:  Rocephin 1 gram given IM    Patient waited in office 20 minutes without any probem.    Patient tolerated injection without complications    Given by: Theresia Lo RN (July 13, 2009 12:11 PM)  Orders Added: 1)  Admin of Injection (IM/SQ) [phone] 2)  Rocephin  250mg  [J0696]   Medication Administration  Injection # 1:    Medication: Rocephin  250mg     Diagnosis: ARTHRITIS, LEFT FOOT, ACUTE (ICD-716.97)    Route: IM    Site: RUOQ gluteus    Exp Date: 10/2011    Lot #: [id number]    Mfr: Novaplus    Comments:  Rocephin 1 gram given IM    Patient waited in office 20 minutes without any probem.    Patient tolerated injection without complications    Given by: Theresia Lo RN (July 13, 2009 12:11 PM)  Orders Added: 1)  Admin of Injection (IM/SQ) [88416] 2)  Rocephin  250mg  [S0630]
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
## 2010-07-23 NOTE — Miscellaneous (Signed)
Summary: prior auth  Clinical Lists Changes prior auth for valceyclovir to pcp.Golden Circle RN  December 04, 2009 10:28 AM  completed yesterday and placed in to be faxed box.  Ardeen Garland  MD  December 06, 2009 9:15 AM

## 2010-10-10 ENCOUNTER — Other Ambulatory Visit (HOSPITAL_COMMUNITY)
Admission: RE | Admit: 2010-10-10 | Discharge: 2010-10-10 | Disposition: A | Discharge status: Home / Self Care | Payer: BLUE CROSS/BLUE SHIELD | Source: Ambulatory Visit | Attending: Family Medicine | Admitting: Family Medicine

## 2010-10-10 ENCOUNTER — Telehealth: Payer: Self-pay | Admitting: Family Medicine

## 2010-10-10 ENCOUNTER — Ambulatory Visit (INDEPENDENT_AMBULATORY_CARE_PROVIDER_SITE_OTHER): Payer: BLUE CROSS/BLUE SHIELD | Admitting: Family Medicine

## 2010-10-10 ENCOUNTER — Encounter: Payer: Self-pay | Admitting: Family Medicine

## 2010-10-10 VITALS — BP 125/84 | HR 84 | Temp 98.6°F | Wt 207.3 lb

## 2010-10-10 DIAGNOSIS — N898 Other specified noninflammatory disorders of vagina: Secondary | ICD-10-CM | POA: Insufficient documentation

## 2010-10-10 DIAGNOSIS — Z124 Encounter for screening for malignant neoplasm of cervix: Secondary | ICD-10-CM | POA: Insufficient documentation

## 2010-10-10 DIAGNOSIS — Z01419 Encounter for gynecological examination (general) (routine) without abnormal findings: Secondary | ICD-10-CM | POA: Insufficient documentation

## 2010-10-10 LAB — POCT WET PREP (WET MOUNT)

## 2010-10-10 MED ORDER — METRONIDAZOLE 500 MG PO TABS: 500.0000 mg | ORAL_TABLET | Freq: Two times a day (BID) | ORAL | Status: DC

## 2010-10-10 NOTE — Assessment & Plan Note (Signed)
Wet prep + clue cells.  Will treat with Flagyl bid x 7 days.  Advised against etoh.

## 2010-10-10 NOTE — Progress Notes (Signed)
  Subjective:    Patient ID: Candice [name], female    DOB: Dec 23, 1985, 25 y.o.   MRN: [id number]  HPI  Vaginal discharge: Present x 2 wks.  Some odor. No abd or vaginal pain.  No abd bleeding.  Had regular menstrual period LMP 09/28/10.  No sexually active.  +pruritis. Had similar event 1-2 yrs ago. She thinks it is BV and she took and old Flagyl x 1 it felt a little better.  No fever, chills. No nausea, vomiting.  Last pap 09/2008: negative for malignancy.  Never had abnormal pap.  Review of Systems Per hpi     Objective:   Physical Exam  Constitutional: She appears well-developed and well-nourished. No distress.  Abdominal: Soft. Bowel sounds are normal. She exhibits no distension. There is no tenderness.  Genitourinary: Uterus normal. No labial fusion. There is no rash, tenderness, lesion or injury on the right labia. There is no rash, tenderness, lesion or injury on the left labia. Cervix exhibits no motion tenderness, no discharge and no friability. Right adnexum displays no mass, no tenderness and no fullness. Left adnexum displays no mass, no tenderness and no fullness. No erythema, tenderness or bleeding around the vagina. No foreign body around the vagina. No signs of injury around the vagina. Vaginal discharge found.  Lymphadenopathy:       Right: No inguinal adenopathy present.       Left: No inguinal adenopathy present.          Assessment & Plan:

## 2010-10-10 NOTE — Assessment & Plan Note (Signed)
Pap done.  No history of abd pap.

## 2010-10-10 NOTE — Patient Instructions (Signed)
Bacterial Vaginosis Bacterial vaginois is an infection in the vagina. It is caused by an overgrowth of certain germs (bacteria). Symptoms may include:  Grey vaginal discharge.   A fish like odor with discharge, especially after sexual intercourse.   Itching or burning of the vagina and vulva.   Burning or pain with urination.   Some women have no signs or symptoms at all.  Normally this is not transmitted by sexual contact. If your symptoms return or if your partner is having any urinary symptoms, your partner should be checked by their caregiver. TREATMENT BV may be treated with medicines that kill germs (antibiotics). The antibiotics come in either pill or vaginal cream forms. Avoid sexual contact until you are well.  SEEK MEDICAL CARE IF:  Your symptoms are not improving after three days of treatment.   You have increased discharge, pain, or fever.  Document Released: 06/09/2005 Document Re-Released: 09/25/2008 [facility] Patient Information 2011 [place], [place].

## 2010-10-10 NOTE — Telephone Encounter (Signed)
Returning MD's call.

## 2010-10-11 NOTE — Telephone Encounter (Signed)
Left message on voicemail.

## 2010-10-14 ENCOUNTER — Encounter: Payer: Self-pay | Admitting: Family Medicine

## 2010-11-05 NOTE — Discharge Summary (Signed)
Candice [name], Candice [name]            ACCOUNT NO.:  [id number]   MEDICAL RECORD NO.:  [id number]          PATIENT TYPE:  INP   LOCATION:  9125                          [facility]   PHYSICIAN:  Phil D. Okey Dupre, M.D.     DATE OF BIRTH:  1985-07-14   DATE OF ADMISSION:  07/25/2007  DATE OF DISCHARGE:  07/28/2007                               DISCHARGE SUMMARY   ADMISSION DIAGNOSES:  1. Intrauterine pregnancy at 40 weeks and 1 day.  2. Spontaneous onset of labor.  3. Spontaneous rupture of membranes.  4. Group B Streptococcus negative.  5. Light meconium-stained amniotic fluid.  6. Morbid obesity.   DISCHARGE DIAGNOSES:  1. Postoperative day number 3 from primary low transverse cesarean      section for cephalopelvic disproportion.  2. Morbid obesity.  3. Group B Streptococcus negative.   PROCEDURE:  The patient had a primary low transverse cesarean section  performed on July 25, 2007, by Dr. Allie Bossier.   COMPLICATIONS:  None.   CONSULTATIONS:  None.   PERTINENT LABORATORY FINDINGS:  The patient had admission labs,  including a CBC with white blood cell count of 17.9, hemoglobin 12.8,  hematocrit 37.8, platelet count 298.  RPR was nonreactive.  Urinalysis  was negative.  CMP:  Sodium was 133, potassium 3.7, creatinine 0.48.  AST 16, ALT 13.  LDH was 161.  Uric acid was 4.  On postoperative day  number one, the patient had a white blood cell count of 17.6, hemoglobin  11.5, hematocrit 34.4, platelets 266.   BRIEF ADMISSION HISTORY:  Candice [name] is a 25 year old gravida 1, para 0  presenting in labor with spontaneous rupture of membranes.  She is seen  in the [facility].  She was found to be 2 cm dilated, 90%  effaced, -1 station.  Mild meconium-stained amniotic fluid was noted.  She was admitted and low-dose pitocin was begun per protocol.   HOSPITAL COURSE:  The patient was admitted and started on low-dose  pitocin.  She labored for several hours and was found  to have  cephalopelvic disproportion.  She was taken for cesarean section and  delivered a viable infant female weighing 7 pounds, 11 ounces with  Apgars of 8 at 1 minute and 9 at 5 minutes.  The patient tolerated the  delivery well and went to the postpartum unit in stable condition.  She  had a stable hemoglobin on postoperative day number 1.  Her postpartum  course was uncomplicated with vital signs that were stable though she  was mildly tachycardic at times.  Blood pressure was in the normal  range.  She was afebrile.  Examination was normal.  The patient did not  have staples for her skin at delivery and was doing well, was in stable  condition for discharge.   DISCHARGE STATUS:  Stable.   DISCHARGE MEDICATIONS:  1. Percocet 5/325 one to two tablets every 6 hours as needed for pain.  2. Prenatal vitamins 1 tablet by mouth daily.  3. Ibuprofen 800 mg 1 tablet every 8 hours as needed.  4. MiraLax 17  grams in 8 ounces of water once daily as needed for      constipation.  5. Colace 100 mg 1 tablet twice daily.   DISCHARGE INSTRUCTIONS:  1. Discharge to home.  2. No sex for 6 weeks.  3. No lifting greater than 10 pounds for 6 weeks.  4. Regular diet.  5. Follow up in 6 weeks at the [facility] for      postpartum examination.      Karlton Lemon, MD  Electronically Signed     ______________________________  Javier Glazier. Okey Dupre, M.D.    NS/MEDQ  D:  07/28/2007  T:  07/28/2007  Job:  [id number]

## 2010-11-05 NOTE — Op Note (Signed)
Candice [name], [name]            ACCOUNT NO.:  [id number]   MEDICAL RECORD NO.:  [id number]          PATIENT TYPE:  INP   LOCATION:  9125                          [facility]   PHYSICIAN:  Allie Bossier, MD        DATE OF BIRTH:  Apr 02, 1986   DATE OF PROCEDURE:  DATE OF DISCHARGE:                               OPERATIVE REPORT   PREOPERATIVE DIAGNOSIS:  1. 38-1/2 weeks' estimated gestational age.  2. Morbid obesity.  3. Cephalopelvic disproportion.  4. History of herpes simplex virus.  5. Excessive weight gain during pregnancy.   SURGEON:  Myra C. Marice Potter, MD.   Threasa HeadsSandria Manly, MD.   ANESTHESIA:  Burnett Corrente, M.D., epidural.   COMPLICATIONS:  None.   ESTIMATED BLOOD LOSS:  Less than 500 mL.   SPECIMENS:  Cord blood.   FINDINGS:  1. Living female infant with Apgars 9 at 1 and 5 minutes, weight      pending.  2. Normal pelvic anatomy.  3. Intact placenta with three-vessel cord.   PROCEDURE AND FINDINGS:  The risks, benefits and alternatives of surgery  were explained, understood and accepted,  consents were signed. She was  taken to the operating room, her epidural was bolused for surgery.  Her  abdomen was prepped and draped in the usual sterile fashion.  Her Foley  catheter drained clear urine throughout the case. After adequate  anesthesia was assured, a transverse incision was made approximately 2  cm above the symphysis pubis. The incision was carried down through the  large amount of subcutaneous tissue to the fascia.  Bleeding encountered  was cauterized with the Bovie. The fascial incision was extended  bilaterally and curved slightly upwards.  The middle one-third of the  rectus muscles were separated in a transverse fashion using  electrosurgical technique.  The peritoneum was entered with hemostats,  peritoneal incision was extended with the Bovie bilaterally, taking care  to avoid the bladder.  A bladder blade was placed, a transverse incision  was  made on the well-developed lower uterine segment.  Amniotomy was  performed with the hemostats and clear fluid was noted.  Please note the  uterine incision was extended bilaterally with bandage scissors and  curved slightly upwards prior to the amniotomy. The baby was delivered  from a vertex lie with an occiput posterior position with a marked  amount of caput on the fetal head.  Mouth and nostrils were suctioned  prior to delivery of the shoulders.  The cord was clamped and cut and  the baby was transferred to the pediatrician for routine care.  A cord  blood sample was obtained.  The placenta was extracted manually and  noted to be intact. The uterus was left in situ. The uterine interior  was cleaned with a dry lap sponge.  A bladder blade was placed.  The  uterine incision was closed with one layer of #0 chromic running locking  suture.  Excellent hemostasis was noted.  By tilting the uterus I was  able to visualize both adnexa.  All was normal.  The uterine incision  was again inspected and noted to be hemostatic as were the rectus  muscles and rectus fascia.  The fascia was closed with a #0 Vicryl  running nonlocking suture.  No defects were palpable.  The subcutaneous  tissue was infiltrated with 30 mL of 0.5% Marcaine in a subcuticular  closure with Marcaine.  It was then irrigated, cleaned,  and dried.  The  subcuticular closure was done with 4-0 Vicryl suture in a running  nonlocking fashion.  Steri-Strips were placed across the incision.  She  was taken to the recovery room in stable condition with instrument,  sponge and needle counts correct.      Allie Bossier, MD  Electronically Signed     MCD/MEDQ  D:  07/25/2007  T:  07/26/2007  Job:  [id number]

## 2010-11-08 NOTE — Group Therapy Note (Signed)
   NAME:  Candice [name], Candice [name]                      ACCOUNT NO.:  [id number]   MEDICAL RECORD NO.:  [id number]                   PATIENT TYPE:  OUT   LOCATION:  [facility]                           [facility]   PHYSICIAN:  Argentina Donovan, MD                     DATE OF BIRTH:  04-27-1986   DATE OF SERVICE:  03/09/2003                                    CLINIC NOTE   SUMMARY:  The patient is a 25 year old black female nulligravida who one  year ago had a copper T IUD put in place.  Since that time she has had  severe pain with her periods with chronic bilateral groin pain with severe  cramps and intermenstrual spotting.  She came in today to have the IUD  removed after they tried at school to remove it and had several doctors try  and could not remove it, and told her that it was grown into the wall.  The  patient came in today.  The uterus is of normal size, shape, and  consistency, retroverted, and attempt to remove it with a packing forceps I  was able to get a hold of the string but it could not budge the IUD with  several attempts at pulling.  The patient was uncomfortable with this.  The  IUD is either embedded into the wall or perforated through the wall - I am  not quite sure which.  We are going to get an ultrasound and a flat plate of  the abdomen to try and better find where it is and then schedule the patient  for dilatation and curettage for removal of a trapped IUD.                                               Argentina Donovan, MD    PR/MEDQ  D:  03/09/2003  T:  03/09/2003  Job:  [id number]

## 2010-11-08 NOTE — Op Note (Signed)
NAME:  Candice [name], Candice [name]                      ACCOUNT NO.:  [id number]   MEDICAL RECORD NO.:  [id number]                   PATIENT TYPE:  AMB   LOCATION:  [facility]C                                  [facility]   PHYSICIAN:  Phil D. Okey Dupre, M.D.                  DATE OF BIRTH:  [birth year]   DATE OF PROCEDURE:  04/03/2003  DATE OF DISCHARGE:                                 OPERATIVE REPORT   PREOPERATIVE DIAGNOSIS:  Trapped intrauterine device.   POSTOPERATIVE DIAGNOSIS:  Trapped intrauterine device.   PROCEDURE:  Removal of IUD and uterine curettage.   SURGEON:  Javier Glazier. Okey Dupre, M.D.   INDICATIONS FOR PROCEDURE:  The patient had an IUD put in one year ago which  gave her severe dysmenorrhea and cramping and several gynecologists  including myself attempted to remove it in the office or clinic setting  without success, although the string was visible; so, we decided to do it  with the patient under anesthesia and do a hysteroscopy.  The patient was  brought in.  She was having her period, so we decided against the  hysteroscopy and we did not have to dilate the cervix because of the period  and were able to curettage this patient with a serrated curette.   DESCRIPTION OF PROCEDURE:  Under satisfactory general anesthesia, with the  patient in the dorsal lithotomy position, perineum and vagina prepped and  draped in the usual sterile manner.  Bimanual pelvic examination under  anesthesia revealed the uterus in first degree retroversion, freely movable  and normal free adnexa with normal size, shape and consistency and normal  adnexa.  Weighted speculum was placed to posterior fourchette of the vagina  through marital introitus.  The BUS was within normal limits and the vaginal  was clean and well rugated, markedly redundant side walls.  Anterior lip of  a nulliparous cervix was grasped with single-tooth tenaculum and the very  tip of the IUD string could be visualized at the external  cervical os.  The  patient was moderately bleeding during her menstrual cycle.  This was  grasped with a packing forceps and by surprise easily removed and it was a  copper T ParaGard IUD.  The uterine cavity was then curetted vigorously with  a small sharp curette.  It was sent for pathological diagnosis.  There was  minimal blood loss during the procedure.  The patient tolerated the  procedure well and was transferred to the recovery room in satisfactory  condition after tenaculum and speculum were removed from the vagina.                                                 Phil D. Okey Dupre, M.D.    PDR/MEDQ  D:  04/03/2003  T:  04/03/2003  Job:  [id number]

## 2010-11-13 ENCOUNTER — Encounter: Payer: Self-pay | Admitting: Family Medicine

## 2010-11-13 ENCOUNTER — Ambulatory Visit (INDEPENDENT_AMBULATORY_CARE_PROVIDER_SITE_OTHER): Payer: BC Managed Care – PPO | Admitting: Family Medicine

## 2010-11-13 VITALS — BP 108/73 | HR 90 | Temp 98.1°F | Ht 63.5 in | Wt 209.0 lb

## 2010-11-13 DIAGNOSIS — M79672 Pain in left foot: Secondary | ICD-10-CM

## 2010-11-13 DIAGNOSIS — M79609 Pain in unspecified limb: Secondary | ICD-10-CM

## 2010-11-13 MED ORDER — FLUCONAZOLE 150 MG PO TABS: 150.0000 mg | ORAL_TABLET | Freq: Once | ORAL | Status: AC

## 2010-11-13 MED ORDER — METHYLPREDNISOLONE 4 MG PO KIT: PACK | ORAL | Status: AC

## 2010-11-13 NOTE — Assessment & Plan Note (Signed)
Pt states that pain is very similar to pain that she had one year ago that was relieved after she was Rx Medrol pack.  I have reviewed notes from [facility] and Rheumatology from 2011.  It seems that pt did improve only after Medrol pack.  Her other studies (xray, RF, ANA) were essentially wnl.  Will Rx medrol pack again.  Pt to f/u in 2 wks if not better.

## 2010-11-13 NOTE — Progress Notes (Signed)
  Subjective:    Patient ID: Candice [name], female    DOB: 02-04-1986, 25 y.o.   MRN: [id number]  HPI  Pt is here for L leg pain.  She describes pain occurring over one year ago.  She had a lot of work up (ANA, RF, radiology) and referral to [facility] and Rheumatology.  She was given Naprosyn to try.  She states that nothing really worked until [employer] gave her a steroid pack.  Pain is very similar to past pain.  It feels like a numbing pain traveling from ankle up the anterior aspect of left LE.   Review of Systems No injury, fever, chills, swelling, weakness, loss of sensation, decreased mobility.    Objective:   Physical Exam  Constitutional: She appears well-developed and well-nourished. No distress.  Musculoskeletal:       Left knee: She exhibits normal range of motion, no swelling, no effusion, no ecchymosis, no deformity, no laceration, no erythema, normal alignment, no LCL laxity, normal patellar mobility and no bony tenderness.          Assessment & Plan:

## 2011-01-13 ENCOUNTER — Encounter: Payer: Self-pay | Admitting: Family Medicine

## 2011-02-04 ENCOUNTER — Other Ambulatory Visit: Payer: BC Managed Care – PPO

## 2011-02-04 DIAGNOSIS — Z3480 Encounter for supervision of other normal pregnancy, unspecified trimester: Secondary | ICD-10-CM

## 2011-02-04 NOTE — Progress Notes (Signed)
Prenatal done today [facility] Lada Fulbright

## 2011-02-05 LAB — OBSTETRIC PANEL
Antibody Screen: NEGATIVE
Basophils Absolute: 0 10*3/uL (ref 0.0–0.1)
Basophils Relative: 0 % (ref 0–1)
Eosinophils Absolute: 0.1 10*3/uL (ref 0.0–0.7)
Eosinophils Relative: 1 % (ref 0–5)
HCT: 39 % (ref 36.0–46.0)
MCH: 27.6 pg (ref 26.0–34.0)
MCHC: 32.6 g/dL (ref 30.0–36.0)
MCV: 84.8 fL (ref 78.0–100.0)
Monocytes Absolute: 1.1 10*3/uL — ABNORMAL HIGH (ref 0.1–1.0)
RDW: 15.3 % (ref 11.5–15.5)

## 2011-02-05 LAB — SICKLE CELL SCREEN: Sickle Cell Screen: NEGATIVE

## 2011-02-05 LAB — HIV ANTIBODY (ROUTINE TESTING W REFLEX): HIV: NONREACTIVE

## 2011-02-06 LAB — CULTURE, OB URINE: Colony Count: 9000

## 2011-02-11 ENCOUNTER — Encounter: Payer: Self-pay | Admitting: Family Medicine

## 2011-02-11 ENCOUNTER — Ambulatory Visit (INDEPENDENT_AMBULATORY_CARE_PROVIDER_SITE_OTHER): Payer: BC Managed Care – PPO | Admitting: Family Medicine

## 2011-02-11 VITALS — BP 132/80 | Wt 212.0 lb

## 2011-02-11 DIAGNOSIS — F329 Major depressive disorder, single episode, unspecified: Secondary | ICD-10-CM

## 2011-02-11 DIAGNOSIS — F17200 Nicotine dependence, unspecified, uncomplicated: Secondary | ICD-10-CM

## 2011-02-11 DIAGNOSIS — F32A Depression, unspecified: Secondary | ICD-10-CM | POA: Insufficient documentation

## 2011-02-11 DIAGNOSIS — Z98891 History of uterine scar from previous surgery: Secondary | ICD-10-CM

## 2011-02-11 DIAGNOSIS — Z331 Pregnant state, incidental: Secondary | ICD-10-CM

## 2011-02-11 DIAGNOSIS — Z9889 Other specified postprocedural states: Secondary | ICD-10-CM

## 2011-02-11 DIAGNOSIS — E669 Obesity, unspecified: Secondary | ICD-10-CM

## 2011-02-11 DIAGNOSIS — Z3480 Encounter for supervision of other normal pregnancy, unspecified trimester: Secondary | ICD-10-CM

## 2011-02-11 DIAGNOSIS — Z3490 Encounter for supervision of normal pregnancy, unspecified, unspecified trimester: Secondary | ICD-10-CM

## 2011-02-11 DIAGNOSIS — B009 Herpesviral infection, unspecified: Secondary | ICD-10-CM

## 2011-02-11 MED ORDER — PRENATAL RX 60-1 MG PO TABS: 1.0000 | ORAL_TABLET | Freq: Every day | ORAL | Status: DC

## 2011-02-11 NOTE — Patient Instructions (Addendum)
It was so great to meet you (and see a picture of your new little one!). If you decide that you want genetic screening, please let us know.  You can have one test for another 1-2 weeks, or have up to week 16 of pregnancy (5 more weeks) for the other one. You are currently 11 weeks and 3 days.  Your due date is August 30, 2011.  For help with your depression, please feel free to call Dr. Pascal Lux and make an appointment [phone]).  She is our Medical sales representative.  Talking with her may help.  I think we both would like to try to hold off on start medications while you are pregnant, but if this is necessary, we can definitely discuss this.  We checked an early Glucola test today.  You will still need the regular one at 18 weeks.  Please come back and see me in 4 weeks for your next visit!

## 2011-02-11 NOTE — Progress Notes (Signed)
Subjective:    Candice [name] is a 25 y.o. female being seen today for her obstetrical visit. She is at [redacted]w[redacted]d gestation. Patient reports fatigue, headache, nausea, no bleeding, no contractions and no cramping. Fetal movement: not yet.  Pt has 27yo F at home who is very excited for pregnancy.  Pregnancy was unexpected.  FOB lives in [place]. Initially, pt was excited but then went to see FOB and found out some "things."  I could speculate that this means he was/ is seeing another woman, but pt did not wish to expand on this topic.  Also has the stressor that her grandmother, who she was close with, died in late 12/23/22.  She has h/o depression and does occasionally have thoughts of wanting to hurt herself. No current SI or HI. No plans.  Does have h/o suicide attempt in past: tried to OD in 2006.  Was on antidepressant medications when she was a teenager, but has not been on them in a while.  Is interested in counseling to help her feel better.  Would like to wait to start medications but knows that this is an option if necessary.  Frequently feels sad and stressed, does not think there is an anxiety component. + crying + fatigue.    Also has been having headaches 3x/week which she needs to "sleep off."  Cannot function with these- have nausea associated with them.  Also having "morning sickness"- nausea in the afternoons. No vomiting, able to eat/drink.  Also occasionally has some lightheadedness and black "floaters" in her vision.  Interested in breast feeding (breast and bottle w/ her daughter until 91 months of age).  Menstrual History: OB History    Grav Para Term Preterm Abortions TAB SAB Ect Mult Living   2 1 1       1       Menarche age: 48 Patient's last menstrual period was 11/23/2010.    The following portions of the patient's history were reviewed and updated as appropriate: allergies, current medications, past family history, past medical history, past social history, past surgical  history and problem list.  Review of Systems A comprehensive review of systems was negative.   Objective:     BP 132/80  Wt 212 lb (96.163 kg)  LMP 11/23/2010 BP recheck 122/58 Gen: NAD, alert, appropriate CV: RRR, no murmur; no LE edema Pulm: CTAB  Uterine Size: too early  Pelvic Exam:          Perineum: is normal                Vulva: normal              Vagina:  normal mucosa, normal discharge                     pH:               Cervix: no cervical motion tenderness and no lesions                  Uterus: normal size, size consistent with 11 weeks             Adnexa: normal adnexa and no mass, fullness, tenderness         Assessment:    Pregnancy 11 and 3/7 weeks   Plan:    Problem list reviewed and updated. Labs reviewed. AFP3 discussed: declined. Role of ultrasound in pregnancy discussed; fetal survey: brief US done in clinic today; cardiac  activity noted; one IUF. Amniocentesis discussed: not indicated. Follow up in 4 weeks. 75% of 60 minute visit spent on counseling and coordination of care. Pt given Dr. Carola Rhine information for concerns with depression.

## 2011-02-11 NOTE — Assessment & Plan Note (Signed)
PHQ-9 on 8/21= 17 (scores 3 for ?s 1, 2, 3, 4; 2 for ?s 5, 6; 1 for ?s 9); somewhat difficult. Given Dr. Carola Rhine information and strongly encouraged pt to call.  Has h/o ?suicide attempt in 2006 and does have occasional thoughts of wanting to hurt self. No current SI or HI. Pt could promise she would not do anything to hurt herself or the baby from now until next visit and was advised to go to closet ER if she did have these thoughts.  Has been on antidepressant medications in the past, but would like to try counseling first at this point, given that she is pregnant.  I am in agreement with this plan of action.

## 2011-02-11 NOTE — Assessment & Plan Note (Signed)
Will need ppx acyclovir closer to [facility].  Last outbreak March 2012.

## 2011-02-11 NOTE — Assessment & Plan Note (Signed)
Currently smokes 1/4 ppd.  Discussed need and benefits of quitting.  Pt would like to quit, in theory, but is not ready at this time to start trying.  Will continue counseling at each visit.

## 2011-02-11 NOTE — Assessment & Plan Note (Signed)
NOB visit.  Concerns include: h/o HSV, h/o C/S, and h/o depression.  PNL WNL.  Fetal cardiac activity visualized on [place] today showing single, viable IUP. GC/Chlam done today. Encouraged PNV. F/u 4 weeks for routine prenatal visit.

## 2011-02-11 NOTE — Assessment & Plan Note (Signed)
Discussed that minimal weight gain is goal for this pregnancy.  Pt also  Needs form filled out for work for [employer], which includes fasting labs.  Pt is to make lab only appt for FASTING lipids and glucose.  Form is due 03/08/11 so advised pt to come back ASAP.

## 2011-02-11 NOTE — Assessment & Plan Note (Addendum)
[facility].  Appears to  Be good candidate- one LTCS for fetal intolerance. No other GYN surgeries except IUD removal that had ?poked through cervix.

## 2011-02-19 NOTE — Progress Notes (Signed)
IOB note reviewed, agree with Dr. Jamie Kato assessment and plan. BREEN,JAMES O

## 2011-02-26 ENCOUNTER — Other Ambulatory Visit: Payer: BC Managed Care – PPO

## 2011-02-26 DIAGNOSIS — E669 Obesity, unspecified: Secondary | ICD-10-CM

## 2011-02-26 LAB — LIPID PANEL
HDL: 57 mg/dL (ref >39)
LDL Cholesterol: 112 mg/dL — ABNORMAL HIGH (ref 0–99)
Total CHOL/HDL Ratio: 3.2 Ratio
VLDL: 11 mg/dL (ref 0–40)

## 2011-02-26 LAB — GLUCOSE, CAPILLARY: Glucose-Capillary: 115 mg/dL — ABNORMAL HIGH (ref 70–99)

## 2011-02-26 NOTE — Progress Notes (Signed)
FLP AND POCT CBG DONE TODAY Candice [name]

## 2011-02-28 ENCOUNTER — Encounter: Payer: Self-pay | Admitting: Family Medicine

## 2011-03-13 LAB — URINALYSIS, ROUTINE W REFLEX MICROSCOPIC
Ketones, ur: NEGATIVE
Protein, ur: NEGATIVE
Urobilinogen, UA: 0.2

## 2011-03-13 LAB — WET PREP, GENITAL: Yeast Wet Prep HPF POC: NONE SEEN

## 2011-03-14 LAB — CBC
HCT: 37.8
MCHC: 34
MCV: 82.3
Platelets: 266
Platelets: 298
RDW: 17 — ABNORMAL HIGH
RDW: 17 — ABNORMAL HIGH
WBC: 17.6 — ABNORMAL HIGH
WBC: 17.9 — ABNORMAL HIGH

## 2011-03-14 LAB — COMPREHENSIVE METABOLIC PANEL
AST: 16
Alkaline Phosphatase: 136 — ABNORMAL HIGH
BUN: 3 — ABNORMAL LOW
CO2: 19
Chloride: 103
Creatinine, Ser: 0.48
GFR calc Af Amer: >60
GFR calc non Af Amer: >60
Potassium: 3.7
Total Bilirubin: 0.7

## 2011-03-14 LAB — RPR: RPR Ser Ql: NONREACTIVE

## 2011-03-14 LAB — URINALYSIS, DIPSTICK ONLY
Glucose, UA: NEGATIVE
Ketones, ur: NEGATIVE
Leukocytes, UA: NEGATIVE
Nitrite: NEGATIVE
Protein, ur: NEGATIVE
Urobilinogen, UA: 0.2

## 2011-03-18 ENCOUNTER — Ambulatory Visit (INDEPENDENT_AMBULATORY_CARE_PROVIDER_SITE_OTHER): Payer: BC Managed Care – PPO | Admitting: Family Medicine

## 2011-03-18 VITALS — BP 112/70 | Wt 216.0 lb

## 2011-03-18 DIAGNOSIS — Z3480 Encounter for supervision of other normal pregnancy, unspecified trimester: Secondary | ICD-10-CM

## 2011-03-18 DIAGNOSIS — Z23 Encounter for immunization: Secondary | ICD-10-CM

## 2011-03-18 DIAGNOSIS — Z3490 Encounter for supervision of normal pregnancy, unspecified, unspecified trimester: Secondary | ICD-10-CM

## 2011-03-18 DIAGNOSIS — F17200 Nicotine dependence, unspecified, uncomplicated: Secondary | ICD-10-CM

## 2011-03-18 NOTE — Assessment & Plan Note (Signed)
Continues to not be ready to quit.

## 2011-03-18 NOTE — Patient Instructions (Addendum)
Everything with you and baby looks great! Keep taking that prenatal vitamin. We will call you with your anatomy ultrasound date and time. We gave you your flu shot today! Come back and see me (or OB clinic) in 4 weeks.

## 2011-03-18 NOTE — Assessment & Plan Note (Addendum)
Doing well. FHT 149. Gained 4# in 1 month. Discussed anticipated wt gain w/ pt again. Has not met w/ Dr. Pascal Lux. Anatomy US ordered. Flu shot given. F/u 4 weeks.

## 2011-03-18 NOTE — Progress Notes (Signed)
S: 25 yo G2P1001 at 16.3 by reliable LMP here for routine prenatal. No concerns or complaints.  Occasional nausea.  Thinks she is feeling fetal mvmt. Had minimal spotting 2-3 times throughout the first 3 months, not currently having any, no pain or pressure associated with it. Has not yet met w/ Dr. Pascal Lux.  Feels like things are going ok.   O: See above.  A/P: 25yo G2P1001 @ 16.3 -will need to meet with OB at [facility] since has h/o C-section -Anatomy US scheduled -PNL WNL -Continue PNV. -Early 1hr GTT WNL, will need regular labs at 28 wks. -Will need valtrex for ppx starting at 32-36 wks. -F/u about depression/mtg w/ Dr. Pascal Lux at next visit.  -Will continue counseling about smoking cessation. -F/u 4 weeks with me or OB clinic

## 2011-04-01 ENCOUNTER — Ambulatory Visit (HOSPITAL_COMMUNITY)
Admission: RE | Admit: 2011-04-01 | Discharge: 2011-04-01 | Disposition: A | Discharge status: Home / Self Care | Payer: BC Managed Care – PPO | Source: Ambulatory Visit | Attending: Family Medicine | Admitting: Family Medicine

## 2011-04-01 DIAGNOSIS — O358XX0 Maternal care for other (suspected) fetal abnormality and damage, not applicable or unspecified: Secondary | ICD-10-CM | POA: Insufficient documentation

## 2011-04-01 DIAGNOSIS — Z1389 Encounter for screening for other disorder: Secondary | ICD-10-CM | POA: Insufficient documentation

## 2011-04-01 DIAGNOSIS — Z3490 Encounter for supervision of normal pregnancy, unspecified, unspecified trimester: Secondary | ICD-10-CM

## 2011-04-01 DIAGNOSIS — Z363 Encounter for antenatal screening for malformations: Secondary | ICD-10-CM | POA: Insufficient documentation

## 2011-04-04 ENCOUNTER — Encounter: Payer: Self-pay | Admitting: Family Medicine

## 2011-04-16 ENCOUNTER — Ambulatory Visit (INDEPENDENT_AMBULATORY_CARE_PROVIDER_SITE_OTHER): Payer: BC Managed Care – PPO | Admitting: Family Medicine

## 2011-04-16 ENCOUNTER — Encounter: Payer: Self-pay | Admitting: Family Medicine

## 2011-04-16 DIAGNOSIS — Z3490 Encounter for supervision of normal pregnancy, unspecified, unspecified trimester: Secondary | ICD-10-CM

## 2011-04-16 DIAGNOSIS — F32A Depression, unspecified: Secondary | ICD-10-CM

## 2011-04-16 DIAGNOSIS — F17200 Nicotine dependence, unspecified, uncomplicated: Secondary | ICD-10-CM

## 2011-04-16 DIAGNOSIS — Z3480 Encounter for supervision of other normal pregnancy, unspecified trimester: Secondary | ICD-10-CM

## 2011-04-16 DIAGNOSIS — F329 Major depressive disorder, single episode, unspecified: Secondary | ICD-10-CM

## 2011-04-16 NOTE — Assessment & Plan Note (Signed)
1/3 ppd, not ready to discuss quitting. Counseled on benefits for her and baby.

## 2011-04-16 NOTE — Assessment & Plan Note (Signed)
Called Dr. Pascal Lux but did not receive call back.  Becomes tearful easy.  Still has Dr. Carola Rhine card (showed me) and will try calling her again to set up appt. No SI/HI.  Would like to try therapy prior to medications.  I'm in agreement with this.  Will reassess PHQ-9 at next visit.

## 2011-04-16 NOTE — Progress Notes (Signed)
S: 25 y.o. G2P1001 @ [redacted]w[redacted]d (EDC 08/30/2011, by Last Menstrual Period )  -Doing well -Good fetal movement: yes -Concerns include: cramping in her thighs, sometimes with weakness in bilateral thighs, feels weak and needs to sit down; also having some abd pains for the past 2-3 weeks, + vaginal pressure, no bleed/dc/fluid; pressure is worse with sharp pain; cramping is mostly when working/standing for long periods of time; drinks water frequently -Continues to smoke 1/3 ppd, not ready to discuss quitting -Mood is still up and down, becomes teary easily   O:  Filed Vitals:   04/16/11 1459  BP: 137/76  REPEAT BP: 12070 Gen: NAD, tearful when discussing mood FH: 21.5cm FHTs:  150s  A/P: 25 y.o. G2P1001 @ [redacted]w[redacted]d (EDC 08/30/2011, by Last Menstrual Period ) - round ligament pain -pt instructed to call Dr. Pascal Lux again -f/u in 4 weeks with me or OB clinic

## 2011-04-16 NOTE — Progress Notes (Signed)
BP 120/70 re-checked. Lorenda Hatchet, Renato Battles

## 2011-04-16 NOTE — Patient Instructions (Addendum)
It was great to see you today! You should go to the [facility] website and take a look at their classes.  They have options for breastfeeding (usually are free) and childbirth classes. I think that the abdominal pain you are having is round ligament pain.  I am giving you some information on this. Make sure you are drinking plenty of water at work! This will hopefully help with some of the cramping! You can also try puchasing a maternity belt to help take some of the sharp pains away.  They sell this at many stores, including [employer] and maternity stores.  Please come back and see either me or the OB clinic in 4 weeks!  Please go to the MAU at [facility] hospital if you start having vaginal bleeding, a big gush of fluid like your water broke, headaches that don't get better with Tylenol, or spots in your vision.     Round Ligament Pain The round ligament is made up of muscle and fibrous tissue. It is attached to the uterus near the fallopian tube. The round ligament is located on both sides of the uterus and helps support the position of the uterus. It usually begins in the second trimester of pregnancy when the uterus comes out of the pelvis. The pain can come and go until the baby is delivered. Round ligament pain is not a serious problem and does not cause harm to the baby. CAUSE During pregnancy the uterus grows the most from the second trimester to delivery. As it grows, it stretches and slightly twists the round ligaments. When the uterus leans from one side to the other, the round ligament on the opposite side pulls and stretches. This can cause pain. SYMPTOMS  Pain can occur on one side or both sides. The pain is usually a short, sharp, and pinching-like. Sometimes it can be a dull, lingering and aching pain. The pain is located in the lower side of the abdomen or in the groin. The pain is internal and usually starts deep in the groin and moves up to the outside of the hip area. Pain can  occur with:  Sudden change in position like getting out of bed or a chair.   Rolling over in bed.   Coughing or sneezing.   Walking too much.   Any type of physical activity.  DIAGNOSIS  Your caregiver will make sure there are no serious problems causing the pain. When nothing serious is found, the symptoms usually indicate that the pain is from the round ligament. TREATMENT   Sit down and relax when the pain starts.   Flex your knees up to your belly.   Lay on your side with a pillow under your belly (abdomen) and another one between your legs.   Sit in a hot bath for 15 to 20 minutes or until the pain goes away.  HOME CARE INSTRUCTIONS   Only take over-the-counter or prescriptions medicines for pain, discomfort or fever as directed by your caregiver.   Sit and stand slowly.   Avoid long walks if it causes pain.   Stop or lessen your physical activities if it causes pain.  SEEK MEDICAL CARE IF:   The pain does not go away with any of your treatment.   You need stronger medication for the pain.   You develop back pain that you did not have before with the side pain.  SEEK IMMEDIATE MEDICAL CARE IF:   You develop a temperature of 102 F (38.9  C) or higher.   You develop uterine contractions.   You develop vaginal bleeding.   You develop nausea, vomiting or diarrhea.   You develop chills.   You have pain when you urinate.

## 2011-04-16 NOTE — Assessment & Plan Note (Addendum)
Initial BP slightly elevated today. Recheck improved.  Round ligament pain. Encouraged maternity belt and increased fluid intake.  Kick counts discussed. F/u 4 weeks.

## 2011-05-13 ENCOUNTER — Ambulatory Visit (INDEPENDENT_AMBULATORY_CARE_PROVIDER_SITE_OTHER): Payer: BC Managed Care – PPO | Admitting: Family Medicine

## 2011-05-13 DIAGNOSIS — F329 Major depressive disorder, single episode, unspecified: Secondary | ICD-10-CM

## 2011-05-13 DIAGNOSIS — F32A Depression, unspecified: Secondary | ICD-10-CM

## 2011-05-13 DIAGNOSIS — F17200 Nicotine dependence, unspecified, uncomplicated: Secondary | ICD-10-CM

## 2011-05-13 DIAGNOSIS — Z3490 Encounter for supervision of normal pregnancy, unspecified, unspecified trimester: Secondary | ICD-10-CM

## 2011-05-13 DIAGNOSIS — Z3480 Encounter for supervision of other normal pregnancy, unspecified trimester: Secondary | ICD-10-CM

## 2011-05-13 NOTE — Assessment & Plan Note (Signed)
Doing much better today. Patient does not feel like she needs to be seen by Dr. Randie Heinz. Still feels like she is very emotional this pregnancy but feels like she has a better handle on her motions at this time. No SI/HI. Does not currently feel like she needs any medicines I agree with her. We'll continue to monitor her mood closely at followup visit.

## 2011-05-13 NOTE — Progress Notes (Signed)
S: 25 y.o. G2P1 @ [redacted]w[redacted]d  (EDC 08/30/2011, by Last Menstrual Period )  -Doing well, no complaints -Good fetal movement: yes -Concerns include: none; feels like her mood has gotten much better since moving back to [place] from [place]. She feels like she is still very emotional but overall doing okay. No SI/HI. She states that now that she is back and [place] she is more help with her 42-year-old daughter and feels like things are going much more smoothly. She continues to smoke about a quarter pack per day of tobacco and smokes marijuana approximately one time per week.   O:  Filed Vitals:   05/13/11 1034  BP: 125/85   See OB flowsheet Gen: NAD, not tearful, mood better than previously  A/P: 25 y.o. G2P1 @ [redacted]w[redacted]d  (EDC 08/30/2011, by Last Menstrual Period )  - needs [facility] referral for TOLAC and discussion of BTL once 30/32 weeks -needs FH reassessed in 2 weeks for possible sizes > dates -1hr GTT and 28 wk labs at next visit -f/u in 2 weeks with me or OB clinic

## 2011-05-13 NOTE — Patient Instructions (Addendum)
It was great to see you! I'm glad that things are going better now that you are back in [place]! If you feel like the baby is not moving as much as usual, please do "kick counts." To feel the baby, sit in a quiet place with your feet up, free of distractions. If you feel the baby kick 5 times in 1 hour, you can stop. If not, feel for a second hour. 10 kicks in 2 hours is very reassuring and makes [place] feel that the baby is doing well.  If you would like, you can dry drinking a small amount of caffeine before counting for the 2nd hour to help baby wake up.  If you do not feel 10 kicks in 2 hours while concentrating, please go to the MAU.  If you have regular contractions that are 3-5 minutes apart for at least one hour, bleeding or fluid from your vagina, or you do not feel the baby moving enough, please go to the [facility].  Please come back in 2 weeks so we can re-measure your belly and make sure you are growing well.  We will draw some labs at that time.  We will also do the sugar test again, so make sure you have at least an hour for your next visit here.

## 2011-05-13 NOTE — Assessment & Plan Note (Signed)
Overall doing well. Pregnancy progressing normally. Size greater than dates which improved after patient emptied her bladder. Will have patient return in 2 weeks for fundal height recheck. Consider ultrasound if fundal height remains elevated at that time. Patient will also be able to have her one hour Glucola and 28 week labs drawn at that visit. Patient continue smoking 1/4 pack of cigarettes per day. She's decreased her marijuana use to once a week. Mood has significantly improved since moving back to [place].

## 2011-05-13 NOTE — Assessment & Plan Note (Signed)
1/4 PPD, not ready to quit. Continue to counsel and benefits for her and baby of quitting tobacco use.

## 2011-05-29 ENCOUNTER — Ambulatory Visit (INDEPENDENT_AMBULATORY_CARE_PROVIDER_SITE_OTHER): Payer: BC Managed Care – PPO | Admitting: Family Medicine

## 2011-05-29 VITALS — BP 114/76 | Wt 230.3 lb

## 2011-05-29 DIAGNOSIS — Z9889 Other specified postprocedural states: Secondary | ICD-10-CM

## 2011-05-29 DIAGNOSIS — Z98891 History of uterine scar from previous surgery: Secondary | ICD-10-CM

## 2011-05-29 DIAGNOSIS — Z3490 Encounter for supervision of normal pregnancy, unspecified, unspecified trimester: Secondary | ICD-10-CM

## 2011-05-29 DIAGNOSIS — F329 Major depressive disorder, single episode, unspecified: Secondary | ICD-10-CM

## 2011-05-29 DIAGNOSIS — Z3480 Encounter for supervision of other normal pregnancy, unspecified trimester: Secondary | ICD-10-CM

## 2011-05-29 DIAGNOSIS — F32A Depression, unspecified: Secondary | ICD-10-CM

## 2011-05-29 LAB — GLUCOSE, CAPILLARY: Glucose-Capillary: 137 mg/dL — ABNORMAL HIGH (ref 70–99)

## 2011-05-29 NOTE — Patient Instructions (Addendum)
It was great to see you! We checked your sugar and other routine labs today. Since your uterus (belly) is measuring a little bit bigger than we would expect at this gestational age, we are going to get an ultrasound to make sure that baby and fluid around baby are normal (I expect that they will be). Please go [facility] if you start having vaginal bleeding, vaginal pressure, cramping, big gush of fluid as if your water broke, or strong abdominal and back pains are coming every 5 minutes lasting for at least an hour. If you feel like the baby is not moving as much as usual, please do "kick counts." To feel the baby, sit in a quiet place with your feet up, free of distractions. If you feel the baby kick 5 times in 1 hour, you can stop. If not, feel for a second hour. 10 kicks in 2 hours is very reassuring and makes [place] feel that the baby is doing well.  If you would like, you can dry drinking a small amount of caffeine before counting for the 2nd hour to help baby wake up.  If you do not feel 10 kicks in 2 hours while concentrating, please go to the MAU.  Come back and see me or OB clinic in 2 weeks! (It can be 4 weeks if you can't get in because of the holiday)

## 2011-05-29 NOTE — Assessment & Plan Note (Signed)
Needs TOLAC referral in the next 4-6 weeks.

## 2011-05-29 NOTE — Assessment & Plan Note (Signed)
Continues to go well and feel better supported now that she is back in [place]. No need for medication or Dr. Pascal Lux referral at this time.

## 2011-05-29 NOTE — Progress Notes (Signed)
S: 25 y.o. G2P1 @ [redacted]w[redacted]d  (EDC 08/30/2011, by Last Menstrual Period )  -Doing well, no complaints -Good fetal movement: yes -Concerns include: none; feels like her mood has continued to improve since moving back to [place] from [place].  She continues to smoke about a quarter pack per day of tobacco and smokes marijuana approximately one time per week, BUT over the past few days has cut down on tobacco more b/c it is just not appealing to her and is not having the urge/cravings.  She has HSV outbreak last week that lasted 2 days and resolved spontaneously (unable to pick up medication 2/2 cost)- now having a small amount of itching where the outbreak was, no discharge or other problems, declines wet prep today.  -Patient remains certain that she wants BTL (FOB for this child is the same as child at home, and they both do not desire more children, she has tried Mirena in the past and needed to have it surgically removed b/c it got "stuck" in her cervix, does not do well with OCPs or depo per report) -will breast and bottle feed   O:  Filed Vitals:   05/29/11 1031  BP: 114/76   See OB flowsheet Gen: NAD, not tearful, mood better than previously  A/P: 25 y.o. G2P1 @ [redacted]w[redacted]d  (EDC 08/30/2011, by Last Menstrual Period )  -needs [facility] referral for TOLAC and discussion of BTL once 30/32 weeks -growth U/S at [facility] for sizes > dates -failed 1 hour GTT, 3 hr GTT scheduled for next Friday, 12/14, pt aware she needs to be fasting -BTL for contraception -Breast and bottle for feeding-- counseled on starting breast only then transitioning to both if desired.  -f/u in 2 weeks with me or OB clinic

## 2011-05-29 NOTE — Assessment & Plan Note (Addendum)
Doing well. Size > dates so will get growth scan to assess fetal size and fluid.  Failed 1hr GTT (137), scheduled for 3hr on 12/14. Good FHTs. 28 week labs drawn. Needs HSV ppx at 36 weeks, needs TOLAC consent, desires BTL. F/u 2 weeks

## 2011-05-30 ENCOUNTER — Telehealth: Payer: Self-pay | Admitting: *Deleted

## 2011-05-30 LAB — RPR

## 2011-05-30 LAB — CBC
MCV: 85.6 fL (ref 78.0–100.0)
Platelets: 310 10*3/uL (ref 150–400)
RBC: 4.24 MIL/uL (ref 3.87–5.11)
RDW: 15.3 % (ref 11.5–15.5)
WBC: 12.6 10*3/uL — ABNORMAL HIGH (ref 4.0–10.5)

## 2011-05-30 LAB — HIV ANTIBODY (ROUTINE TESTING W REFLEX): HIV: NONREACTIVE

## 2011-05-30 NOTE — Telephone Encounter (Signed)
Called and informed patient of appt for [place] at [facility] on 06/03/11 at 11:30.Candice [name], Rodena Medin

## 2011-06-03 ENCOUNTER — Ambulatory Visit (HOSPITAL_COMMUNITY)
Admission: RE | Admit: 2011-06-03 | Discharge: 2011-06-03 | Disposition: A | Discharge status: Home / Self Care | Payer: BC Managed Care – PPO | Source: Ambulatory Visit | Attending: Family Medicine | Admitting: Family Medicine

## 2011-06-03 DIAGNOSIS — Z3689 Encounter for other specified antenatal screening: Secondary | ICD-10-CM | POA: Insufficient documentation

## 2011-06-03 DIAGNOSIS — Z3490 Encounter for supervision of normal pregnancy, unspecified, unspecified trimester: Secondary | ICD-10-CM

## 2011-06-04 ENCOUNTER — Encounter: Payer: Self-pay | Admitting: Family Medicine

## 2011-06-06 ENCOUNTER — Other Ambulatory Visit: Payer: BC Managed Care – PPO

## 2011-06-06 DIAGNOSIS — Z3490 Encounter for supervision of normal pregnancy, unspecified, unspecified trimester: Secondary | ICD-10-CM

## 2011-06-06 NOTE — Progress Notes (Signed)
3 hr gtt done today Candice [name] 

## 2011-06-07 LAB — GLUCOSE TOLERANCE, 3 HOURS
Glucose Tolerance, 1 hour: 122 mg/dL (ref 70–189)
Glucose Tolerance, 2 hour: 134 mg/dL (ref 70–164)

## 2011-06-09 ENCOUNTER — Encounter: Payer: Self-pay | Admitting: Family Medicine

## 2011-06-13 ENCOUNTER — Ambulatory Visit (INDEPENDENT_AMBULATORY_CARE_PROVIDER_SITE_OTHER): Payer: BC Managed Care – PPO | Admitting: Family Medicine

## 2011-06-13 DIAGNOSIS — Z3480 Encounter for supervision of other normal pregnancy, unspecified trimester: Secondary | ICD-10-CM

## 2011-06-13 DIAGNOSIS — Z9889 Other specified postprocedural states: Secondary | ICD-10-CM

## 2011-06-13 DIAGNOSIS — N760 Acute vaginitis: Secondary | ICD-10-CM

## 2011-06-13 DIAGNOSIS — F17200 Nicotine dependence, unspecified, uncomplicated: Secondary | ICD-10-CM

## 2011-06-13 DIAGNOSIS — B373 Candidiasis of vulva and vagina: Secondary | ICD-10-CM

## 2011-06-13 DIAGNOSIS — Z98891 History of uterine scar from previous surgery: Secondary | ICD-10-CM

## 2011-06-13 DIAGNOSIS — Z3490 Encounter for supervision of normal pregnancy, unspecified, unspecified trimester: Secondary | ICD-10-CM

## 2011-06-13 LAB — POCT WET PREP (WET MOUNT)
Clue Cells Wet Prep HPF POC: NEGATIVE
Trichomonas Wet Prep HPF POC: NEGATIVE

## 2011-06-13 MED ORDER — FLUCONAZOLE 150 MG PO TABS: 150.0000 mg | ORAL_TABLET | Freq: Once | ORAL | Status: DC

## 2011-06-13 NOTE — Patient Instructions (Addendum)
It was great to see you! Everything with you and baby looks great!  I think the itching is from a yeast infection. I sent in a medicine called Diflucan 2 your pharmacy. He will take one pill once and this should help clear it up. If you're still having itching in 3 days, please call and I can send in a second pill for you to take. However, I think that the one pill regimen should clear it up. Make sure you keep taking your prenatal vitamin. If you feel like the baby is not moving as much as usual, please do "kick counts." To feel the baby, sit in a quiet place with your feet up, free of distractions. If you feel the baby kick 5 times in 1 hour, you can stop. If not, feel for a second hour. 10 kicks in 2 hours is very reassuring and makes [place] feel that the baby is doing well.  If you would like, you can dry drinking a small amount of caffeine before counting for the 2nd hour to help baby wake up.  If you do not feel 10 kicks in 2 hours while concentrating, please go to the MAU.  Please go [facility] if you start having vaginal bleeding, vaginal pressure, cramping, big gush of fluid as if your water broke, or strong abdominal and back pains are coming every 5 minutes lasting for at least an hour.  Have a great Christmas and New Year's!! I (or OB clinic) will see you in about 2 weeks!

## 2011-06-13 NOTE — Assessment & Plan Note (Signed)
1/4 PPD, not ready to quit. Continue to counsel and benefits for her and baby of quitting tobacco use. 

## 2011-06-13 NOTE — Progress Notes (Signed)
S: 25 y.o. G2P1001 @ [redacted]w[redacted]d  (EDC 08/30/2011, by Last Menstrual Period )  -Doing well, no complaints -Good fetal movement: yes -Concerns include: Continues to have some vaginal itching. This had started after her herpes outbreak approximately 3 weeks ago. It has not resolved since that time. She states that it is worse with heat, such as when she is taking a hot shower. She's not noticed any vaginal discharge or bleeding. She's not noticed any further warts in treating herself with Valtrex for 3 days. However, the itching continues.   O:  Filed Vitals:   06/13/11 1418  BP: 135/77   See OB flowsheet Gen: NAD Pelvic: normal vagina, normal labia, no warts or other abnormalities noted, tenderness to speculum but not bimaual exam, white watery d/c noted on spec exam, no blood, external os closed without surround erythema or irritation on cervix, no CMT  A/P: 25 y.o. G2P1001 @ [redacted]w[redacted]d  (EDC 08/30/2011, by Last Menstrual Period )  -Doing well, 3hr GTT WNL and U/S for interval growth was normal.  FH grew appropriately in the interval.  -Continues to smoke, encouraged cessation.  Pt not ready to quit. -Pelvic exam shows white d/c with yeast on wet prep -f/u in 2 weeks with me or OB clinic

## 2011-06-13 NOTE — Assessment & Plan Note (Signed)
Yeast infection as cause of itching. Will treat with Diflucan 150 mg by mouth x1.

## 2011-06-13 NOTE — Assessment & Plan Note (Signed)
Needs TOLAC referral in the next 4-6 weeks. 

## 2011-06-13 NOTE — Assessment & Plan Note (Signed)
Passed 3hr GTT and had normal growth U/S. Appropriate fundal height interval growth. 28 week labs within normal limits. Will need HSV prophylaxis at 36 weeks. Will need TOLAC consent at approximately 32 weeks. Can also have consent for tubal ligation at that time. Followup in 2 weeks.

## 2011-06-30 ENCOUNTER — Ambulatory Visit (INDEPENDENT_AMBULATORY_CARE_PROVIDER_SITE_OTHER): Payer: BC Managed Care – PPO | Admitting: Family Medicine

## 2011-06-30 DIAGNOSIS — Z3490 Encounter for supervision of normal pregnancy, unspecified, unspecified trimester: Secondary | ICD-10-CM

## 2011-06-30 DIAGNOSIS — Z3480 Encounter for supervision of other normal pregnancy, unspecified trimester: Secondary | ICD-10-CM

## 2011-06-30 DIAGNOSIS — Z98891 History of uterine scar from previous surgery: Secondary | ICD-10-CM

## 2011-06-30 DIAGNOSIS — F17200 Nicotine dependence, unspecified, uncomplicated: Secondary | ICD-10-CM

## 2011-06-30 DIAGNOSIS — Z9889 Other specified postprocedural states: Secondary | ICD-10-CM

## 2011-06-30 NOTE — Assessment & Plan Note (Signed)
Overall doing well. Fundal height more consistent with dates than in past. Patient did not have weight gain and actually had 1 pound weight loss from last visit, however given patient's overweight nature this is not overly concerning. TOLAC and BTL consent will be done on 1/14 by Dr. Debroah Loop at [facility] hospital. Patient will need HSV prophylaxis starting at 36 weeks. Patient is to followup in 2 weeks. At the following visit, she will then need GBS, gonorrhea/Chlamydia cx, and emphasis on starting HSV prophylaxis.

## 2011-06-30 NOTE — Assessment & Plan Note (Signed)
Continues to smoke and is not ready to quit. Again counseled patient on the benefits for her and baby of quitting. Patient states understanding.

## 2011-06-30 NOTE — Patient Instructions (Addendum)
It was great to see you! Everything with you and baby looked great. If you feel like the baby is not moving as much as usual, please do "kick counts." To feel the baby, sit in a quiet place with your feet up, free of distractions. If you feel the baby kick 5 times in 1 hour, you can stop. If not, feel for a second hour. 10 kicks in 2 hours is very reassuring and makes [place] feel that the baby is doing well.  If you would like, you can dry drinking a small amount of caffeine before counting for the 2nd hour to help baby wake up.  If you do not feel 10 kicks in 2 hours while concentrating, please go to the MAU.  Please go [facility] if you start having vaginal bleeding, vaginal pressure, cramping, big gush of fluid as if your water broke, or strong abdominal and back pains are coming every 5 minutes lasting for at least an hour.  Your appointment at [facility] to discuss your vaginal delivery (TOLAC) and tubal ligation (BTL) is: with Dr. Debroah Loop on Monday 07/07/11 at 1:00.  On the ground floor at [facility].  Go in through the clinic and let them know you need to go to Dr. Olivia Mackie office.  Please come back to see me or OB clinic in 2 weeks.

## 2011-06-30 NOTE — Progress Notes (Signed)
S: 26 y.o. G2P1001 @ [redacted]w[redacted]d  (EDC 08/30/2011, by Last Menstrual Period )  -Doing well, no complaints -Good fetal movement: yes -Concerns include: achiness in the afternoons esp when at work (stand and walk all day), Tylenol doesn't help, better after sleeping overnight   O:  Filed Vitals:   06/30/11 1426  BP: 122/76   See OB flowsheet Gen: NAD   A/P: 26 y.o. G2P1001 @ [redacted]w[redacted]d  (EDC 08/30/2011, by Last Menstrual Period )  -Doing well, 3hr GTT WNL and U/S for interval growth was normal.  FH grew appropriately in the interval.  -Continues to smoke, encouraged cessation.  Pt not ready to quit. -Decreased vaginal discharge, no further itching -Weight loss, 1#, pt not particularly more active or watching diet; will monitor -[facility] for TOLAC and BTL consent -f/u in 2 weeks with me or OB clinic

## 2011-06-30 NOTE — Assessment & Plan Note (Signed)
Appointment made for 07/07/11 at 1 PM with Dr. Debroah Loop.

## 2011-07-09 ENCOUNTER — Encounter: Payer: Self-pay | Admitting: Family Medicine

## 2011-07-24 ENCOUNTER — Ambulatory Visit (INDEPENDENT_AMBULATORY_CARE_PROVIDER_SITE_OTHER): Payer: BC Managed Care – PPO | Admitting: Family Medicine

## 2011-07-24 VITALS — BP 126/84 | Wt 235.8 lb

## 2011-07-24 DIAGNOSIS — Z3480 Encounter for supervision of other normal pregnancy, unspecified trimester: Secondary | ICD-10-CM

## 2011-07-24 DIAGNOSIS — Z23 Encounter for immunization: Secondary | ICD-10-CM

## 2011-07-24 DIAGNOSIS — B373 Candidiasis of vulva and vagina: Secondary | ICD-10-CM

## 2011-07-24 MED ORDER — FLUCONAZOLE 150 MG PO TABS: 150.0000 mg | ORAL_TABLET | ORAL | Status: DC

## 2011-07-24 MED ORDER — ACYCLOVIR 400 MG PO TABS: ORAL_TABLET | ORAL | Status: DC

## 2011-07-24 NOTE — Patient Instructions (Addendum)
Thank you for coming into clinic today. Everything is going very well with the pregnancy. Please take the Diflucan as prescribed. If your symptoms do not improve then we will need to due further testing at your next appointmet. We will start you on Acyclovir at your next appointment in anticipation of your delivery. As discussed we are moving forward with your paperwork for your tubal ligation. You may back out of this at any time. I've included some information below regarding Tubal Ligation. I've also included some information below concerning VBAC. Please come back to clinic in 2 weeks. Please continue your kick counts.    Postpartum Tubal Ligation A postpartum tubal ligation (PPTL) is when the fallopian tubes are tied after a pregnancy. The fallopian tubes carry the eggs from the ovary to the uterus. A PPTL procedure is done to permanently prevent pregnancy (sterilization). Although this procedure may be reversed, it should be considered permanent and irreversible. This means they cannot be repaired. You should consider that you will never have children again.   This decision should be thought over carefully and discussed with your partner. Discuss it while you are pregnant in order to make a more sound and intelligent decision, rather than waiting until the baby is born. It may be good to wait until a day or two after the birth to make sure everything is all right with the baby. You must be absolutely sure you do not want another pregnancy. PPTL should be delayed if any medical problems develop during labor and delivery with the mother or the baby. RISKS AND COMPLICATIONS  Infection. A germ starts growing in the wound. This can usually be treated with antibiotics.     Fever.    Bleeding is a complication of almost all surgeries. However, it is not common following this surgery.     Pregnancy. This may happen when the body repairs itself and the tubes or one of the tubes is again able to transport  an egg to the uterus. This may also occur as the result of a surgical failure. All surgeries, regardless of how perfectly they are done, are not always successful with perfect results.     Tubal pregnancy. A tubal pregnancy may occur following a tubal ligation when the tube has repaired enough to transport an egg. Because the tube has been damaged by surgery, it is more likely that the fertilized egg can implant in the tube. A tubal pregnancy can be life-threatening.     Injury to surrounding organs and blood vessels.     There is an increase incidence of hysterectomy later in life. The reason is unknown.     Regret is a late complication. You may wish to carry another pregnancy again. Chances of this can be lessened by careful decision making before the procedure. All possibilities should be thought of. This includes the things you do not like to think about such as divorce, loss of your spouse, death or loss of your children.  Women who regret having a tubal ligation and wish to become pregnant again have a couple of choices that include:  Reversing the tubal ligation, untying and connecting the tubes again. However:     There is a higher risk of a tubal pregnancy.     It is not always successful.     In vitro fertilization. However, it is:     Very complicated and demanding on the patient.     Not always successful.  PROCEDURE   The  PPTL is a surgical procedure. This procedure can be safely performed right after delivery or the day after delivery.    If it is done following a vaginal delivery, it can be done through a small cut (incision) just beneath the belly button. A medicine will be used that numbs the area or puts you to sleep (anesthetic).     If a caesarean section is performed, the decision on whether to have a tubal ligation is usually made before the surgery. This is so the tubal ligation may be done at the same time, after delivery of the baby.     The fallopian tubes are  tied off with 2 stitches (sutures).     A small piece of the tube is removed and then looked at under a microscope to make sure it is the tube.  Different procedures are available for performing the surgery on the tubes. Your surgeon will discuss the pros and cons for PPTL. A PPTL does not require a longer hospital stay. Document Released: 06/09/2005 Document Revised: 02/19/2011 Document Reviewed: 09/27/2008 [facility] Patient Information 2012 [place], [place].   Vaginal Birth After Cesarean Delivery Vaginal birth after Cesarean delivery (VBAC) is giving birth vaginally after previously delivering a baby by a cesarean. In the past, if a woman had a Cesarean delivery, all births afterwards would be done by Cesarean delivery. This is no longer true. It can be safe for the mother to try a vaginal delivery after having a Cesarean. The final decision to have a VBAC or repeat Cesarean delivery should be between the patient and her caregiver. The risks and benefits can be discussed relative to the reason for, and the type of the previous Cesarean delivery. WOMEN WHO PLAN TO HAVE A VBAC SHOULD CHECK WITH THEIR DOCTOR TO BE SURE THAT:  The previous Cesarean was done with a low transverse uterine incision (not a vertical classical incision).     The birth canal is big enough for the baby.     There were no other operations on the uterus.     They will have an electronic fetal monitor (EFM) on at all times during labor.     An operating room would be available and ready in case an emergency Cesarean is needed.     A doctor and surgical nursing staff would be available at all times during labor to be ready to do an emergency Cesarean if necessary.     An anesthesiologist would be present in case an emergency Cesarean is needed.     The nursery is prepared and has adequate personnel and necessary equipment available to care for the baby in case of an emergency Cesarean.  BENEFITS OF VBAC:  Shorter stay  in the hospital.     Lower delivery, nursery and hospital costs.     Less blood loss and need for blood transfusions.     Less fever and discomfort from major surgery.     Lower risk of blood clots.     Lower risk of infection.     Shorter recovery after going home.     Lower risk of other surgical complications, such as opening of the incision or hernia in the incision.     Decreased risk of injury to other organs.     Decreased risk for having to remove the uterus (hysterectomy).     Decreased risk for the placenta to completely or partially cover the opening of the uterus (placenta previa) with a future pregnancy.  Ability to have a larger family if desired.  RISKS OF A VBAC:  Rupture of the uterus.     Having to remove the uterus (hysterectomy) if it ruptures.     All the complications of major surgery and/or injury to other organs.     Excessive bleeding, blood clots and infection.     Lower Apgar scores (method to evaluate the newborn based on appearance, pulse, grimace, activity, and respiration) and more risks to the baby.     There is a higher risk of uterine rupture if you induce or augment labor.     There is a higher risk of uterine rupture if you use medications to ripen the cervix.  VBAC SHOULD NOT BE DONE IF:  The previous Cesarean was done with a vertical (classical) or T-shaped incision, or you do not know what kind of an incision was made.     You had a ruptured uterus.     You had surgery on your uterus.     You have medical or obstetrical problems.     There are problems with the baby.     There were two previous Cesarean deliveries and no vaginal deliveries.  OTHER FACTS TO KNOW ABOUT VBAC:  It is safe to have an epidural anesthetic with VBAC.     It is safe to turn the baby from a breech position (attempt an external cephalic version).     It is safe to try a VBAC with twins.     Pregnancies later than 40 weeks have not been  successful with VBAC.     There is an increased failure rate of a VBAC in obese pregnant women.     There is an increased failure rate with VABC if the baby weighs 8.8 pounds (4000 grams) or more.     There is an increased failure rate if the time between the Cesarean and VBAC is less than 19 months.     There is an increased failure rate if pre-eclampsia is present (high blood pressure, protein in the urine and swelling of face and extremities).     VBAC is very successful if there was a previous vaginal birth.     VBAC is very successful when the labor starts spontaneously before the due date.     Delivery of VBAC is similar to having a normal spontaneous vaginal delivery.  It is important to discuss VBAC with your caregiver early in the pregnancy so you can understand the risks, benefits and options. It will give you time to decide what is best in your particular case relevant to the reason for your previous Cesarean delivery. It should be understood that medical changes in the mother or pregnancy may occur during the pregnancy, which make it necessary to change you or your caregiver's initial decision. The counseling, concerns and decisions should be documented in the medical record and signed by all parties. Document Released: 11/30/2006 Document Revised: 02/19/2011 Document Reviewed: 07/21/2008 [facility] Patient Information 2012 [place], [place].

## 2011-07-24 NOTE — Progress Notes (Signed)
25yo G2P1 @ 83 5/7 gestation doing very well. Complains of some vaginal irritation and white discharge consistent w/ a previously diagnosed yeast infection 1 month ago which initially cleared with diflucan but came back after 2 wks. Has not been sexually active since becoming pregnant. No current vaginal lesions. Still using marijuana x1 every other week. Pt still wants to try VBAC and has discussed this physicians at [facility].  Discussed BTL w/ pt who wants to move forward w/ filling out paperwork but may still decline. Pt w/ concerns over possible future relationships and perhaps wanting children.   A/P 25yo G2P1 at 34 5/7 Doing very well w/ likely yeast infection. Will treat w/ Diflucan 150mg  orally, one time today and a second dose in 2 days. If symptoms persist will perform vaginal exam at next visit as pt is due for cultures prior to delivery. No h/o active herpetic lesions. Will start Acyclovir at 36 weeks (will be dosed at 400mg  three times daily starting at 36 weeks), Rx sent in already. Pt is consented today for BTL w/ the understanding that she has the right to change her mind regarding permanent sterilization at any time prior to the procedure.  She expresses some reservation about her current partner (FOB) as a long-term life partner.  Pt to continue this discussion w/ Dr. Fara Boros at next appointment. Pt to return in 2 wks.

## 2011-07-29 ENCOUNTER — Telehealth: Payer: Self-pay | Admitting: Family Medicine

## 2011-07-29 NOTE — Telephone Encounter (Signed)
Completed, placed on Donna's desk. 

## 2011-07-29 NOTE — Telephone Encounter (Signed)
FMLA forms placed in Dr. McGill's box for completion.  [place], Candice [name]  

## 2011-07-29 NOTE — Telephone Encounter (Signed)
Maternity leave paperwork due today to be completed and faxed to [phone]. Pt states just received paperwork in mail yesterday.

## 2011-07-30 NOTE — Telephone Encounter (Signed)
FMLA forms faxed to [phone].  Candice [name]

## 2011-08-06 ENCOUNTER — Ambulatory Visit (INDEPENDENT_AMBULATORY_CARE_PROVIDER_SITE_OTHER): Payer: BC Managed Care – PPO | Admitting: Family Medicine

## 2011-08-06 ENCOUNTER — Other Ambulatory Visit (HOSPITAL_COMMUNITY)
Admission: RE | Admit: 2011-08-06 | Discharge: 2011-08-06 | Disposition: A | Discharge status: Home / Self Care | Payer: BC Managed Care – PPO | Source: Ambulatory Visit | Attending: Family Medicine | Admitting: Family Medicine

## 2011-08-06 VITALS — BP 128/76 | Wt 240.0 lb

## 2011-08-06 DIAGNOSIS — B3731 Acute candidiasis of vulva and vagina: Secondary | ICD-10-CM

## 2011-08-06 DIAGNOSIS — Z98891 History of uterine scar from previous surgery: Secondary | ICD-10-CM

## 2011-08-06 DIAGNOSIS — Z3480 Encounter for supervision of other normal pregnancy, unspecified trimester: Secondary | ICD-10-CM

## 2011-08-06 DIAGNOSIS — Z9889 Other specified postprocedural states: Secondary | ICD-10-CM

## 2011-08-06 DIAGNOSIS — B373 Candidiasis of vulva and vagina: Secondary | ICD-10-CM

## 2011-08-06 DIAGNOSIS — Z331 Pregnant state, incidental: Secondary | ICD-10-CM

## 2011-08-06 DIAGNOSIS — B009 Herpesviral infection, unspecified: Secondary | ICD-10-CM

## 2011-08-06 DIAGNOSIS — F17200 Nicotine dependence, unspecified, uncomplicated: Secondary | ICD-10-CM

## 2011-08-06 DIAGNOSIS — Z3490 Encounter for supervision of normal pregnancy, unspecified, unspecified trimester: Secondary | ICD-10-CM

## 2011-08-06 DIAGNOSIS — Z113 Encounter for screening for infections with a predominantly sexual mode of transmission: Secondary | ICD-10-CM | POA: Insufficient documentation

## 2011-08-06 LAB — POCT WET PREP (WET MOUNT): Clue Cells Wet Prep Whiff POC: NEGATIVE

## 2011-08-06 MED ORDER — FLUCONAZOLE 150 MG PO TABS: 150.0000 mg | ORAL_TABLET | ORAL | Status: AC

## 2011-08-06 NOTE — Assessment & Plan Note (Addendum)
Itching again, white d/c noted on spec exam, treat again with diflucan, but will treat once weekly since this is a recurrent issue.

## 2011-08-06 NOTE — Assessment & Plan Note (Signed)
Pt taking acyclovir ppx.  Counseled on taking it TID not daily.  No current outbreak on exam today.

## 2011-08-06 NOTE — Assessment & Plan Note (Addendum)
Doing well, GC/Chlam and GBS done today.  Wet prep again showing yeast.  Just finished diflucan 2 pill course given 2 weeks ago at last visit.  FH c/w dates.  BTL consent signed at last appt 2 weeks ago. HSV ppx started. Consider checking for presentation at next visit with U/S.

## 2011-08-06 NOTE — Patient Instructions (Signed)
It was great to see you today! You and baby look great! It doesn't look like yeast under the microscope, but I'm going to treat your itching anyway.  I am going to have you take diflucan 150mg  by mouth once per week (today, next Wed, etc) until you deliver.  If you have regular contractions that are 3-5 minutes apart for at least one hour, bleeding or fluid from your vagina, or you do not feel the baby moving enough, please go to the [facility] IF YOU ARE ADMITTED IN LABOR, have your NURSE page me! [phone] If you feel like the baby is not moving as much as usual, please do "kick counts." To feel the baby, sit in a quiet place with your feet up, free of distractions. If you feel the baby kick 5 times in 1 hour, you can stop. If not, feel for a second hour. 10 kicks in 2 hours is very reassuring and makes [place] feel that the baby is doing well.  If you would like, you can dry drinking a small amount of caffeine before counting for the 2nd hour to help baby wake up.  If you do not feel 10 kicks in 2 hours while concentrating, please go to the MAU.   Come back and see me in 1-2 weeks :)

## 2011-08-06 NOTE — Progress Notes (Signed)
S: 26 y.o. G2P1001 @ [redacted]w[redacted]d  (EDC 08/30/2011, by Last Menstrual Period )  -Doing well, no complaints -Good fetal movement: yes -Concerns include:  1. Vaginal itching-- took 2 diflucan pills from last visit, helped for a brief period (1 1/2 weeks), but now has started again, no d/c yet, itching started last night 2. Pelvic pain in her bones, no contractions    O:  Filed Vitals:   08/06/11 1333  BP: 128/76   See OB flowsheet Gen: NAD Pelvic: no external lesions, cervix normal in appearance, + milky white d/c in vaginal, no erythema or cervical friability.   A/P: 26 y.o. G2P1001 @ [redacted]w[redacted]d  (EDC 08/30/2011, by Last Menstrual Period )  -Doing well, 3hr GTT WNL and U/S for interval growth was normal.  FH grew appropriately in the interval.  -GBS, GC/Chlam done today -Wet prep done today, showing yeast- will treat again with diflucan -Patient taking HSV ppx -Continues to smoke, encouraged cessation.  Pt not ready to quit but down to 3 cig/day.  No longer smoking THC.  -Decreased vaginal discharge, no further itching -Women's for TOLAC consent- done -BTL consent done in clinic by Dr. Mauricio Po -f/u in 1 week with me or OB clinic

## 2011-08-06 NOTE — Assessment & Plan Note (Signed)
TOLAC consent signed

## 2011-08-06 NOTE — Assessment & Plan Note (Signed)
Down to 3 cig/day, encouraged total cessation.

## 2011-08-07 ENCOUNTER — Encounter: Payer: Self-pay | Admitting: Family Medicine

## 2011-08-09 ENCOUNTER — Encounter: Payer: Self-pay | Admitting: Family Medicine

## 2011-08-18 ENCOUNTER — Ambulatory Visit (INDEPENDENT_AMBULATORY_CARE_PROVIDER_SITE_OTHER): Payer: BC Managed Care – PPO | Admitting: Family Medicine

## 2011-08-18 DIAGNOSIS — B009 Herpesviral infection, unspecified: Secondary | ICD-10-CM

## 2011-08-18 DIAGNOSIS — Z9889 Other specified postprocedural states: Secondary | ICD-10-CM

## 2011-08-18 DIAGNOSIS — Z98891 History of uterine scar from previous surgery: Secondary | ICD-10-CM

## 2011-08-18 DIAGNOSIS — Z3490 Encounter for supervision of normal pregnancy, unspecified, unspecified trimester: Secondary | ICD-10-CM

## 2011-08-18 DIAGNOSIS — Z3480 Encounter for supervision of other normal pregnancy, unspecified trimester: Secondary | ICD-10-CM

## 2011-08-18 NOTE — Progress Notes (Signed)
S: 26 y.o. G2P1001 @ [redacted]w[redacted]d  (EDC 08/30/2011, by Last Menstrual Period )  -Doing well, no complaints -Good fetal movement: yes -Concerns include: pelvic bone discomfort at night; decreased vaginal itching/discharge    O:  Filed Vitals:   08/18/11 1338  BP: 124/72   See OB flowsheet Gen: NAD  A/P: 26 y.o. G2P1001 @ [redacted]w[redacted]d  (EDC 08/30/2011, by Last Menstrual Period )  -Doing well, 3hr GTT WNL and U/S for interval growth was normal.  FH grew appropriately in the interval.  -GBS, GC/Chlam done today -Wet prep done today, showing yeast- will treat again with diflucan -Patient taking HSV ppx -Continues to smoke, encouraged cessation.  Pt not ready to quit but down to 3 cig/day.  No longer smoking THC.  -Women's for TOLAC consent- done -BTL consent done in clinic by Dr. Mauricio Po -f/u in 1 week with me or OB clinic

## 2011-08-18 NOTE — Assessment & Plan Note (Signed)
Taking PPX

## 2011-08-18 NOTE — Assessment & Plan Note (Signed)
Doing well, no contractions, mild pelvic pressure/discomfort.  Given warning signs of labor and my pager number.  F/u 1 week.  Consider U/S to confirm vertex presentation.

## 2011-08-18 NOTE — Assessment & Plan Note (Signed)
Still desires VBAC.  TOLAC consent signed at [facility].

## 2011-08-18 NOTE — Patient Instructions (Signed)
It was great to see you today! You and baby look great! If you have regular contractions that are 3-5 minutes apart for at least one hour, bleeding or fluid from your vagina, or you do not feel the baby moving enough, please go to the [facility]. IF YOU ARE ADMITTED IN LABOR, have your NURSE page me! [phone] If you feel like the baby is not moving as much as usual, please do "kick counts." To feel the baby, sit in a quiet place with your feet up, free of distractions. If you feel the baby kick 5 times in 1 hour, you can stop. If not, feel for a second hour. 10 kicks in 2 hours is very reassuring and makes [place] feel that the baby is doing well.  If you would like, you can dry drinking a small amount of caffeine before counting for the 2nd hour to help baby wake up.  If you do not feel 10 kicks in 2 hours while concentrating, please go to the MAU.   Come back and see me in 1-2 weeks :)

## 2011-08-19 ENCOUNTER — Telehealth: Payer: Self-pay | Admitting: *Deleted

## 2011-08-19 NOTE — Telephone Encounter (Signed)
Message left on voicemail that form for FMLA has been completed . She will need to come by and pick up or sign a  ROI in order for [place] to fax.

## 2011-08-25 ENCOUNTER — Ambulatory Visit: Payer: BC Managed Care – PPO | Admitting: Family Medicine

## 2011-08-25 DIAGNOSIS — B009 Herpesviral infection, unspecified: Secondary | ICD-10-CM

## 2011-08-25 DIAGNOSIS — Z98891 History of uterine scar from previous surgery: Secondary | ICD-10-CM

## 2011-08-25 DIAGNOSIS — Z3490 Encounter for supervision of normal pregnancy, unspecified, unspecified trimester: Secondary | ICD-10-CM

## 2011-08-25 NOTE — Assessment & Plan Note (Signed)
Tolerating ppx well

## 2011-08-25 NOTE — Assessment & Plan Note (Signed)
Plans to VBAC. Consented.

## 2011-08-25 NOTE — Patient Instructions (Addendum)
It was great to see you today! You and baby look great! If you have regular contractions that are 3-5 minutes apart for at least one hour, bleeding or fluid from your vagina, or you do not feel the baby moving enough, please go to the [facility]. IF YOU ARE ADMITTED IN LABOR, have your NURSE page me! [phone] If you feel like the baby is not moving as much as usual, please do "kick counts." To feel the baby, sit in a quiet place with your feet up, free of distractions. If you feel the baby kick 5 times in 1 hour, you can stop. If not, feel for a second hour. 10 kicks in 2 hours is very reassuring and makes [place] feel that the baby is doing well.  If you would like, you can dry drinking a small amount of caffeine before counting for the 2nd hour to help baby wake up.  If you do not feel 10 kicks in 2 hours while concentrating, please go to the MAU.   We are scheduling you for testing next week since you will be 40 weeks, just to make sure you and baby are doing well, on 3/12 at 10am at [facility].  Come back and see me next week!

## 2011-08-25 NOTE — Progress Notes (Signed)
S: 26 y.o. G2P1001 @ [redacted]w[redacted]d  (EDC 08/30/2011, by Last Menstrual Period )  -Doing well, no complaints -Good fetal movement: yes -Concerns include: pelvic bone discomfort at night; decreased vaginal itching/discharge    O:  Filed Vitals:   08/25/11 1423  BP: 120/70   See OB flowsheet Gen: NAD  A/P: 26 y.o. G2P1001 @ [redacted]w[redacted]d  (EDC 08/30/2011, by Last Menstrual Period )  -Doing well, 3hr GTT WNL and U/S for interval growth was normal.  FH grew appropriately in the interval.  -GBS, GC/Chlam done today -Recurrent yeast- will treat with weekly diflucan -Patient taking HSV ppx -Continues to smoke, encouraged cessation.  Pt not ready to quit but down to 3 cig/day.  No longer smoking THC.  -Women's for TOLAC consent- done -BTL consent done in clinic by Dr. Mauricio Po -f/u in 1 week with me or OB clinic -NST/BPP scheduled for post-dates 3/12 at 10am

## 2011-08-25 NOTE — Assessment & Plan Note (Addendum)
Doing well, no ctx, FT-1/40/-3 today.  No external lesions.  BPP scheduled for 3/12 at 10am. F/u 1 week. Pt given pager number in case of labor.

## 2011-09-01 ENCOUNTER — Ambulatory Visit (INDEPENDENT_AMBULATORY_CARE_PROVIDER_SITE_OTHER): Payer: BC Managed Care – PPO | Admitting: Family Medicine

## 2011-09-01 DIAGNOSIS — Z3480 Encounter for supervision of other normal pregnancy, unspecified trimester: Secondary | ICD-10-CM

## 2011-09-01 DIAGNOSIS — Z98891 History of uterine scar from previous surgery: Secondary | ICD-10-CM

## 2011-09-01 DIAGNOSIS — Z9889 Other specified postprocedural states: Secondary | ICD-10-CM

## 2011-09-01 DIAGNOSIS — Z3490 Encounter for supervision of normal pregnancy, unspecified, unspecified trimester: Secondary | ICD-10-CM

## 2011-09-01 DIAGNOSIS — B009 Herpesviral infection, unspecified: Secondary | ICD-10-CM

## 2011-09-01 NOTE — Patient Instructions (Signed)
It was great to see you! Based on our cervical exam today, your cervix has not really changed since last week.  I do not think your water has broken. For now, I think you should go home and wait for your contractions to get closer together and/or stronger, or for your water to break.  You can do things like take a hot bath, walk around, or try nipple stimulation (ex using your breast pump) to see if you can help jump start your labor. You have an appointment at [facility] for an Ultrasound tomorrow 3/12, at 10am.  I would keep this appointment unless you are admitted in labor tonight. Please have your nurse page me at [phone] when you are admitted to women's! Go to Women's if your contractions become so strong you can't talk through them (you need to stop what you're doing), you have a big gush of fluid like your water broke, or you start feeling pressure in your vagina or rectal area.   If, for some reason, you do not go into labor, we will schedule your induction for next Saturday.

## 2011-09-01 NOTE — Assessment & Plan Note (Signed)
Doing well, taking HSV ppx, TOLAC consented, would like to try for vaginal delivery.  Early labor today with some mild contractions, but regular.  Cervical exam unchanged from last week.  Will have pt go home and wait for labor to become stronger.  Scheduled induction for Sat 3/16 at 7am in case pt dose not go into spontaneous labor.  NST/BPP scheduled for tomorrow at 10am at [facility].  Labor warning signs discussed.

## 2011-09-01 NOTE — Progress Notes (Signed)
S: 26 y.o. G2P1001 @ [redacted]w[redacted]d  (EDC 08/30/2011, by Last Menstrual Period )  -Doing well, had small amount of vaginal bleeding/mucous 1-2 days ago, now having contractions every 5-6 minutes; able to talk through them; bleeding was a very small amount, no fluid leaking as if water broke -Good fetal movement: yes   O:  Filed Vitals:   09/01/11 1339  BP: 124/89   See OB flowsheet Gen: NAD Cervical exam (per Dr. Mauricio Po): high, unchanged from last week (still posterior, thick, FT)  A/P: 26 y.o. G2P1001 @ [redacted]w[redacted]d  (EDC 08/30/2011, by Last Menstrual Period )  -Doing well, 3hr GTT WNL and U/S for interval growth was normal.  FH grew appropriately in the interval.  -GBS, GC/Chlam done and negative -Recurrent yeast- treating with weekly diflucan -Patient taking HSV ppx -Continues to smoke, encouraged cessation.  Pt not ready to quit but down to 3 cig/day.  No longer smoking THC.  -[facility]- done -BTL consent done in clinic by Dr. Mauricio Po -NST/BPP scheduled for post-dates 3/12 at 10am -patient instructed that she can go home and wait for stronger ctx or go to [facility] if she feels she should be evaluated further -Induction scheduled for 3/16 at 7am

## 2011-09-02 ENCOUNTER — Ambulatory Visit (INDEPENDENT_AMBULATORY_CARE_PROVIDER_SITE_OTHER): Payer: BC Managed Care – PPO | Admitting: *Deleted

## 2011-09-02 ENCOUNTER — Telehealth (HOSPITAL_COMMUNITY): Payer: Self-pay | Admitting: *Deleted

## 2011-09-02 ENCOUNTER — Encounter (HOSPITAL_COMMUNITY): Payer: Self-pay | Admitting: *Deleted

## 2011-09-02 ENCOUNTER — Telehealth: Payer: Self-pay | Admitting: Family Medicine

## 2011-09-02 VITALS — BP 144/73 | Wt 249.7 lb

## 2011-09-02 DIAGNOSIS — O480 Post-term pregnancy: Secondary | ICD-10-CM

## 2011-09-02 NOTE — H&P (Signed)
Candice [name] is a  26 year old G2P1001 at 41.0 by reliable LMP and anatomy U/S presenting for post-dates induction of labor. Patient reports: No contractions, good fetal movement, No LOF, No vaginal bleeding.   Complications this pregnancy:  -h/o C/S for fetal intolerance of labor, fetal bradycardia, and maternal HTN (pt reports dilating to 6 or 7); TOLAC consent done by Dr. Debroah Loop 07/07/11 -HSV positive; started on acyclovir at 35 weeks, patient reliably taking -Tobacco and THC use during pregnancy -Sizes > dates, resolved during pregnancy and follow up U/S normal with EFW in 30th%ile -Marginal cord insertion  -Maternal h/o depression    History OB History    Grav Para Term Preterm Abortions TAB SAB Ect Mult Living   2 1 1       1      Past Medical History  Diagnosis Date  . No pertinent past medical history   . Depression   . HSV 06/28/2007  . Complication of anesthesia     epidural with last pregnancy made entire left side numb, had to d/c   Past Surgical History  Procedure Date  . Cesarean section   . Iud removal 2005    in cervix   Family History: family history includes Alcohol abuse in her father and mother; Cancer in her maternal grandmother; Diabetes in her maternal grandmother; Drug abuse in her mother; Hypertension in her maternal grandmother; and Mental illness in her sister. Social History:  reports that she has been smoking Cigarettes.  She has been smoking about .3 packs per day. She has never used smokeless tobacco. She reports that she uses illicit drugs (Marijuana). She reports that she does not drink alcohol.  ROS    Last menstrual period 11/23/2010. Exam Physical Exam  There were no vitals filed for this visit.  Gen: NAD CV: RRR, no murmur Pulm: CTA bilaterally Abd: gravid Ext: minimal BLE edema SVE: 1/70%/-2  Prenatal labs: ABO, Rh: AB/POS/-- (08/14 1607) Antibody: NEG (08/14 1607) Rubella: 103.6 (08/14 1607) RPR: NON REAC (12/06 1139)    HBsAg: NEGATIVE (08/14 1607)  HIV: NON REACTIVE (12/06 1139)  GBS: NEGATIVE (02/13 1356)   Assessment/Plan: 26 yo G2P1001 with h/o C/S at 41.0 weeks presents for IOL 1. Since patient has previous C/S, can NOT use cytotec.  Patient will likely need a foley bulb and low dose pitocin for her induction.  2. Patient GBS negative, no need for intrapartum abx 3. HSV positive, treated with appropriate ppx starting at 35 weeks 4. H/o problem with epidural, pt would like to try to use IV pain medication if possible; may discuss epidural option with anesthesia if not getting adequate pain control with IV meds 5. Breast and bottle feed; female, unsure about circumcision 6. Patient consented for BTL, but now unsure if this is her method of choice for birth control, will need to be re-explored   MCGILL,JACQUELYN 09/02/2011, 3:08 PM   Herbert Marken 09/06/2011 8:43 AM

## 2011-09-02 NOTE — Telephone Encounter (Signed)
Preadmission screen  

## 2011-09-02 NOTE — Progress Notes (Signed)
P = 114   IOL scheduled on 09/06/11 @ 0700.

## 2011-09-02 NOTE — Telephone Encounter (Signed)
Called and informed patient that she is scheduled for IOL on Saturday 3/16 at 7am.  Instructed [facility] to [facility] at that time to be admitted for post-dates induction.  I will plan to start her H&P before then so that it is ready for when she is admitted.  Patient no longer having contractions and, to her dismay, feeling quite well today.

## 2011-09-02 NOTE — Progress Notes (Signed)
NST performed on 09/02/2011 was reviewed and was found to be reactive.  Continue recommended antenatal testing and prenatal care.

## 2011-09-06 ENCOUNTER — Inpatient Hospital Stay (HOSPITAL_COMMUNITY)
Admission: RE | Admit: 2011-09-06 | Discharge: 2011-09-09 | DRG: 371 | Disposition: A | Discharge status: Home / Self Care | Payer: BC Managed Care – PPO | Source: Ambulatory Visit | Attending: Family Medicine | Admitting: Family Medicine

## 2011-09-06 ENCOUNTER — Encounter (HOSPITAL_COMMUNITY): Payer: Self-pay

## 2011-09-06 VITALS — BP 122/75 | HR 99 | Temp 98.0°F | Resp 20 | Ht 63.0 in | Wt 249.0 lb

## 2011-09-06 DIAGNOSIS — Z3490 Encounter for supervision of normal pregnancy, unspecified, unspecified trimester: Secondary | ICD-10-CM

## 2011-09-06 DIAGNOSIS — F329 Major depressive disorder, single episode, unspecified: Secondary | ICD-10-CM

## 2011-09-06 DIAGNOSIS — B009 Herpesviral infection, unspecified: Secondary | ICD-10-CM

## 2011-09-06 DIAGNOSIS — O480 Post-term pregnancy: Secondary | ICD-10-CM

## 2011-09-06 DIAGNOSIS — Z302 Encounter for sterilization: Secondary | ICD-10-CM

## 2011-09-06 DIAGNOSIS — F32A Depression, unspecified: Secondary | ICD-10-CM

## 2011-09-06 DIAGNOSIS — O34219 Maternal care for unspecified type scar from previous cesarean delivery: Secondary | ICD-10-CM | POA: Diagnosis present

## 2011-09-06 DIAGNOSIS — Z98891 History of uterine scar from previous surgery: Secondary | ICD-10-CM

## 2011-09-06 LAB — CBC
HCT: 37.1 % (ref 36.0–46.0)
Hemoglobin: 12 g/dL (ref 12.0–15.0)
MCH: 26.8 pg (ref 26.0–34.0)
MCHC: 32.3 g/dL (ref 30.0–36.0)
MCV: 82.8 fL (ref 78.0–100.0)

## 2011-09-06 MED ORDER — LACTATED RINGERS IV SOLN
INTRAVENOUS | Status: DC
  Administered 2011-09-06 – 2011-09-07 (×3): via INTRAVENOUS

## 2011-09-06 MED ORDER — FENTANYL CITRATE 0.05 MG/ML IJ SOLN
50.0000 ug | INTRAMUSCULAR | Status: DC | PRN
  Administered 2011-09-06 – 2011-09-07 (×4): 50 ug via INTRAVENOUS
  Filled 2011-09-06 (×4): qty 2

## 2011-09-06 MED ORDER — TERBUTALINE SULFATE 1 MG/ML IJ SOLN: 0.2500 mg | Freq: Once | INTRAMUSCULAR | Status: AC | PRN

## 2011-09-06 MED ORDER — ONDANSETRON HCL 4 MG/2ML IJ SOLN: 4.0000 mg | Freq: Four times a day (QID) | INTRAMUSCULAR | Status: DC | PRN

## 2011-09-06 MED ORDER — CITRIC ACID-SODIUM CITRATE 334-500 MG/5ML PO SOLN
30.0000 mL | ORAL | Status: DC | PRN
  Administered 2011-09-07: 30 mL via ORAL
  Filled 2011-09-06: qty 15

## 2011-09-06 MED ORDER — IBUPROFEN 600 MG PO TABS: 600.0000 mg | ORAL_TABLET | Freq: Four times a day (QID) | ORAL | Status: DC | PRN

## 2011-09-06 MED ORDER — FLEET ENEMA 7-19 GM/118ML RE ENEM: 1.0000 | ENEMA | RECTAL | Status: DC | PRN

## 2011-09-06 MED ORDER — OXYTOCIN 20 UNITS IN LACTATED RINGERS INFUSION - SIMPLE: 125.0000 mL/h | Freq: Once | INTRAVENOUS | Status: DC

## 2011-09-06 MED ORDER — OXYTOCIN 20 UNITS IN LACTATED RINGERS INFUSION - SIMPLE: 1.0000 m[IU]/min | INTRAVENOUS | Status: DC

## 2011-09-06 MED ORDER — LACTATED RINGERS IV SOLN
500.0000 mL | INTRAVENOUS | Status: DC | PRN
  Administered 2011-09-07: 1000 mL via INTRAVENOUS

## 2011-09-06 MED ORDER — LIDOCAINE HCL (PF) 1 % IJ SOLN
30.0000 mL | INTRAMUSCULAR | Status: DC | PRN
  Filled 2011-09-06: qty 30

## 2011-09-06 MED ORDER — ACETAMINOPHEN 325 MG PO TABS: 650.0000 mg | ORAL_TABLET | ORAL | Status: DC | PRN

## 2011-09-06 MED ORDER — OXYTOCIN BOLUS FROM INFUSION
500.0000 mL | Freq: Once | INTRAVENOUS | Status: DC
  Filled 2011-09-06: qty 500

## 2011-09-06 MED ORDER — OXYCODONE-ACETAMINOPHEN 5-325 MG PO TABS: 1.0000 | ORAL_TABLET | ORAL | Status: DC | PRN

## 2011-09-06 MED ORDER — OXYTOCIN 20 UNITS IN LACTATED RINGERS INFUSION - SIMPLE
1.0000 m[IU]/min | INTRAVENOUS | Status: DC
  Administered 2011-09-06: 2 m[IU]/min via INTRAVENOUS
  Filled 2011-09-06: qty 1000

## 2011-09-06 NOTE — Progress Notes (Signed)
Bellamarie L Arcilla is a 26 y.o. G2P1001 at [redacted]w[redacted]d by LMP admitted for induction of labor due to Post dates. Due date 08/30/11.  Subjective: Starting to feel some pelvic pressure, not feeling contractions yet.   Objective: BP 151/77  Pulse 106  Temp(Src) 97.8 F (36.6 C) (Oral)  Resp 18  Ht 5\' 3"  (1.6 m)  Wt 112.946 kg (249 lb)  BMI 44.11 kg/m2  LMP 11/23/2010      FHT:  FHR: 145 bpm, variability: moderate,  accelerations:  Present,  decelerations:  Absent UC:   irregular, SVE:   Dilation: 1 Effacement (%): 70 Station: -2 Foley bulb still in place Labs: Lab Results  Component Value Date   WBC 14.5* 09/06/2011   HGB 12.0 09/06/2011   HCT 37.1 09/06/2011   MCV 82.8 09/06/2011   PLT 292 09/06/2011    Assessment / Plan: Induction of labor due to postterm,  progressing well on pitocin  Labor: Progressing normally Preeclampsia:  None Fetal Wellbeing:  Category I Pain Control:  Labor support without medications I/D:  n/a Anticipated MOD:  NSVD  Suraiya Dickerson 09/06/2011, 12:40 PM

## 2011-09-06 NOTE — H&P (Deleted)
Candice [name] is a 26 y.o. female presenting for Induction of Labor for Post-dates, she is [redacted]w[redacted]d today.  She denies any contractions, abdominal pain, leakage of fluid or vaginal bleeding.  Fetal movement is good.    History OB History    Grav Para Term Preterm Abortions TAB SAB Ect Mult Living   2 1 1       1      Past Medical History  Diagnosis Date  . No pertinent past medical history   . Depression   . HSV 06/28/2007  . Complication of anesthesia     epidural with last pregnancy made entire left side numb, had to d/c   Past Surgical History  Procedure Date  . Cesarean section   . Iud removal 2005    in cervix   Family History: family history includes Alcohol abuse in her father and mother; Cancer in her maternal grandmother; Diabetes in her maternal grandmother; Drug abuse in her mother; Hypertension in her maternal grandmother; and Mental illness in her sister. Social History:  reports that she has been smoking Cigarettes.  She has been smoking about .3 packs per day. She has never used smokeless tobacco. She reports that she uses illicit drugs (Marijuana). She reports that she does not drink alcohol.  Review of Systems  Constitutional: Negative for fever.  Eyes: Negative for blurred vision.  Respiratory: Negative for shortness of breath.   Cardiovascular: Negative for chest pain.  Gastrointestinal: Negative for abdominal pain.  Genitourinary: Negative for dysuria.  Skin: Negative for rash.  Neurological: Negative for dizziness.      Blood pressure 133/77, pulse 116, temperature 97.8 F (36.6 C), temperature source Oral, resp. rate 18, height 5\' 3"  (1.6 m), weight 112.946 kg (249 lb), last menstrual period 11/23/2010. Maternal Exam:  Uterine Assessment: Contraction strength is mild.  Contraction frequency is rare and irregular.   Abdomen: Patient reports no abdominal tenderness. Surgical scars: low transverse.   Fetal presentation: vertex  Introitus: Normal vulva.  Pelvis: adequate for delivery.   Cervix: Cervix evaluated by digital exam.    1/70%/-2 Physical Exam  Constitutional: She is oriented to person, place, and time. She appears well-developed and well-nourished. No distress.  HENT:  Head: Normocephalic and atraumatic.  Neck: No JVD present.  Cardiovascular: Normal rate, regular rhythm and normal heart sounds.   Respiratory: Effort normal and breath sounds normal.  GI:       Gravid, no tenderness.   Musculoskeletal: She exhibits no edema.  Neurological: She is alert and oriented to person, place, and time.    Prenatal labs: ABO, Rh: AB/POS/-- (08/14 1607) Antibody: NEG (08/14 1607) Rubella: 103.6 (08/14 1607) RPR: NON REAC (12/06 1139)  HBsAg: NEGATIVE (08/14 1607)  HIV: NON REACTIVE (12/06 1139)  GBS: NEGATIVE (02/13 1356)   Assessment/Plan: 26 year old G2P1001 @ 41.0 who presents for induction due to post dates.  She had a prior low transverse caesarian section due to fetal distress.  Currently patient is comvortable.  1) Induction of labor- plan on foley bulb for cervical dilatation followed by pitocin. Continuous monitoring due to TOLAC.   Candice [name] 09/06/2011, 8:17 AM

## 2011-09-06 NOTE — Progress Notes (Signed)
Patient ID: Candice [name], female   DOB: [birth year], 26 y.o.   MRN: [id number] Candice [name] is a 26 y.o. G2P1001 at [redacted]w[redacted]d by LMP admitted for induction of labor due to Post Dates.  Subjective: Pt uncomfortable, asking for pain medicaiton  Objective: BP 117/54  Pulse 102  Temp(Src) 98.3 F (36.8 C) (Oral)  Resp 18  Ht 5\' 3"  (1.6 m)  Wt 112.946 kg (249 lb)  BMI 44.11 kg/m2  LMP 11/23/2010      FHT:  FHR: 140 bpm, variability: moderate,  accelerations:  Present,  decelerations:  Absent UC: 2-3 min SVE:   7-8/90%/-2 Labs: Lab Results  Component Value Date   WBC 14.5* 09/06/2011   HGB 12.0 09/06/2011   HCT 37.1 09/06/2011   MCV 82.8 09/06/2011   PLT 292 09/06/2011    Assessment / Plan: Induction of labor due to postterm,  progressing well on pitocin  Labor: Progressing on Pitocin, will continue to increase then AROM Fetal Wellbeing:  Category I Pain Control:  Fentanyl I/D:  n/a Anticipated MOD:  NSVD Dr. Fara Boros Notified.   Candice [name] 09/06/2011, 11:50 PM

## 2011-09-06 NOTE — Progress Notes (Signed)
Patient ID: [name], female   DOB: 05-Feb-1986, 26 y.o.   MRN: [id number] JYLLIAN HAYNIE is a 26 y.o. G2P1001 at [redacted]w[redacted]d by LMP admitted for induction of labor due to Post Dates.  Subjective: Foley bulb fell out  Objective: BP 128/65  Pulse 101  Temp(Src) 97.5 F (36.4 C) (Oral)  Resp 19  Ht 5\' 3"  (1.6 m)  Wt 112.946 kg (249 lb)  BMI 44.11 kg/m2  LMP 11/23/2010      FHT:  FHR: 140 bpm, variability: moderate,  accelerations:  Present,  decelerations:  Absent UC:   Few, irregular.  SVE:   Dilation: 4 Effacement (%): 90 Station: -2 Exam by:: dr Lula Olszewski  Labs: Lab Results  Component Value Date   WBC 14.5* 09/06/2011   HGB 12.0 09/06/2011   HCT 37.1 09/06/2011   MCV 82.8 09/06/2011   PLT 292 09/06/2011    Assessment / Plan: Induction of labor due to postterm,  progressing well on pitocin  Labor: Progressing on Pitocin, will continue to increase then AROM Fetal Wellbeing:  Category I Pain Control:  Fentanyl I/D:  n/a Anticipated MOD:  NSVD  Jakyria Bleau 09/06/2011, 7:37 PM

## 2011-09-06 NOTE — Progress Notes (Signed)
Patient ID: Candice [name], female   DOB: 25-Jun-1985, 26 y.o.   MRN: [id number] Candice [name] is a 26 y.o. G2P1001 at [redacted]w[redacted]d by LMP admitted for induction of labor due to Post Dates.  Subjective: Pt is feeling contractions.   Objective: BP 141/74  Pulse 103  Temp(Src) 97.4 F (36.3 C) (Oral)  Resp 20  Ht 5\' 3"  (1.6 m)  Wt 112.946 kg (249 lb)  BMI 44.11 kg/m2  LMP 11/23/2010      FHT:  FHR: 140 bpm, variability: moderate,  accelerations:  Present,  decelerations:  Absent UC: 2-3 min SVE:   6/80%/-2 Labs: Lab Results  Component Value Date   WBC 14.5* 09/06/2011   HGB 12.0 09/06/2011   HCT 37.1 09/06/2011   MCV 82.8 09/06/2011   PLT 292 09/06/2011    Assessment / Plan: Induction of labor due to postterm,  progressing well on pitocin  Labor: Progressing on Pitocin, will continue to increase then AROM Fetal Wellbeing:  Category I Pain Control:  Fentanyl I/D:  n/a Anticipated MOD:  NSVD Dr. Fara Boros Notified.   Candice [name] 09/06/2011, 9:37 PM

## 2011-09-06 NOTE — Progress Notes (Signed)
Wynetta L Hove is a 26 y.o. G2P1001 at [redacted]w[redacted]d by LMP admitted for induction of labor due to Post Dates.  Subjective: Patient feeling a little pelvic pressure  Objective: BP 149/73  Pulse 106  Temp(Src) 98.1 F (36.7 C) (Oral)  Resp 20  Ht 5\' 3"  (1.6 m)  Wt 112.946 kg (249 lb)  BMI 44.11 kg/m2  LMP 11/23/2010      FHT:  FHR: 145 bpm, variability: moderate,  accelerations:  Present,  decelerations:  Absent UC:   none SVE:   Dilation: 2 Effacement (%): 90 Station:  (foley bulb in place behind cervix) Exam by:: [email or web address], rnc -ob  Labs: Lab Results  Component Value Date   WBC 14.5* 09/06/2011   HGB 12.0 09/06/2011   HCT 37.1 09/06/2011   MCV 82.8 09/06/2011   PLT 292 09/06/2011    Assessment / Plan: Induction of labor due to postterm,  progressing well on pitocin  Labor: Progressing on Pitocin, will continue to increase then AROM Fetal Wellbeing:  Category I Pain Control:  Fentanyl I/D:  n/a Anticipated MOD:  NSVD  Jeramia Saleeby 09/06/2011, 5:01 PM

## 2011-09-07 ENCOUNTER — Inpatient Hospital Stay (HOSPITAL_COMMUNITY): Payer: BC Managed Care – PPO | Admitting: Anesthesiology

## 2011-09-07 ENCOUNTER — Encounter (HOSPITAL_COMMUNITY): Payer: Self-pay

## 2011-09-07 ENCOUNTER — Encounter (HOSPITAL_COMMUNITY): Payer: Self-pay | Admitting: Anesthesiology

## 2011-09-07 ENCOUNTER — Encounter (HOSPITAL_COMMUNITY)
Admission: RE | Disposition: A | Discharge status: Home / Self Care | Payer: Self-pay | Source: Ambulatory Visit | Attending: Family Medicine

## 2011-09-07 DIAGNOSIS — Z302 Encounter for sterilization: Secondary | ICD-10-CM

## 2011-09-07 DIAGNOSIS — O480 Post-term pregnancy: Secondary | ICD-10-CM

## 2011-09-07 DIAGNOSIS — O34219 Maternal care for unspecified type scar from previous cesarean delivery: Secondary | ICD-10-CM

## 2011-09-07 LAB — CBC
MCH: 26.7 pg (ref 26.0–34.0)
MCHC: 32.6 g/dL (ref 30.0–36.0)
Platelets: 245 10*3/uL (ref 150–400)

## 2011-09-07 SURGERY — Surgical Case
Anesthesia: Regional | Site: Abdomen | Wound class: Clean Contaminated

## 2011-09-07 MED ORDER — IBUPROFEN 600 MG PO TABS
600.0000 mg | ORAL_TABLET | Freq: Four times a day (QID) | ORAL | Status: DC
  Administered 2011-09-08 – 2011-09-09 (×5): 600 mg via ORAL
  Filled 2011-09-07 (×5): qty 1

## 2011-09-07 MED ORDER — LACTATED RINGERS IV SOLN
INTRAVENOUS | Status: DC | PRN
  Administered 2011-09-07 (×3): via INTRAVENOUS

## 2011-09-07 MED ORDER — OXYTOCIN 10 UNIT/ML IJ SOLN
INTRAMUSCULAR | Status: AC
  Filled 2011-09-07: qty 4

## 2011-09-07 MED ORDER — ONDANSETRON HCL 4 MG/2ML IJ SOLN: 4.0000 mg | Freq: Three times a day (TID) | INTRAMUSCULAR | Status: DC | PRN

## 2011-09-07 MED ORDER — FENTANYL 2.5 MCG/ML BUPIVACAINE 1/10 % EPIDURAL INFUSION (WH - ANES)
14.0000 mL/h | INTRAMUSCULAR | Status: DC
  Administered 2011-09-07 (×2): 14 mL/h via EPIDURAL
  Filled 2011-09-07 (×2): qty 60

## 2011-09-07 MED ORDER — PHENYLEPHRINE 40 MCG/ML (10ML) SYRINGE FOR IV PUSH (FOR BLOOD PRESSURE SUPPORT)
80.0000 ug | PREFILLED_SYRINGE | INTRAVENOUS | Status: DC | PRN
  Filled 2011-09-07: qty 5

## 2011-09-07 MED ORDER — NALOXONE HCL 0.4 MG/ML IJ SOLN: 0.4000 mg | INTRAMUSCULAR | Status: DC | PRN

## 2011-09-07 MED ORDER — ONDANSETRON HCL 4 MG/2ML IJ SOLN
INTRAMUSCULAR | Status: AC
  Filled 2011-09-07: qty 2

## 2011-09-07 MED ORDER — MORPHINE SULFATE 0.5 MG/ML IJ SOLN
INTRAMUSCULAR | Status: AC
  Filled 2011-09-07: qty 10

## 2011-09-07 MED ORDER — DIBUCAINE 1 % RE OINT: 1.0000 "application " | TOPICAL_OINTMENT | RECTAL | Status: DC | PRN

## 2011-09-07 MED ORDER — MENTHOL 3 MG MT LOZG: 1.0000 | LOZENGE | OROMUCOSAL | Status: DC | PRN

## 2011-09-07 MED ORDER — WITCH HAZEL-GLYCERIN EX PADS: 1.0000 "application " | MEDICATED_PAD | CUTANEOUS | Status: DC | PRN

## 2011-09-07 MED ORDER — MORPHINE SULFATE (PF) 0.5 MG/ML IJ SOLN
INTRAMUSCULAR | Status: DC | PRN
  Administered 2011-09-07: 3.5 mg via EPIDURAL

## 2011-09-07 MED ORDER — PHENYLEPHRINE 40 MCG/ML (10ML) SYRINGE FOR IV PUSH (FOR BLOOD PRESSURE SUPPORT): 80.0000 ug | PREFILLED_SYRINGE | INTRAVENOUS | Status: DC | PRN

## 2011-09-07 MED ORDER — ZOLPIDEM TARTRATE 5 MG PO TABS: 5.0000 mg | ORAL_TABLET | Freq: Every evening | ORAL | Status: DC | PRN

## 2011-09-07 MED ORDER — SIMETHICONE 80 MG PO CHEW
80.0000 mg | CHEWABLE_TABLET | Freq: Three times a day (TID) | ORAL | Status: DC
  Administered 2011-09-07 – 2011-09-08 (×5): 80 mg via ORAL

## 2011-09-07 MED ORDER — METOCLOPRAMIDE HCL 5 MG/ML IJ SOLN: 10.0000 mg | Freq: Three times a day (TID) | INTRAMUSCULAR | Status: DC | PRN

## 2011-09-07 MED ORDER — SCOPOLAMINE 1 MG/3DAYS TD PT72
MEDICATED_PATCH | TRANSDERMAL | Status: AC
  Filled 2011-09-07: qty 1

## 2011-09-07 MED ORDER — LIDOCAINE HCL (PF) 1 % IJ SOLN
INTRAMUSCULAR | Status: DC | PRN
  Administered 2011-09-07 (×3): 4 mL

## 2011-09-07 MED ORDER — MIDAZOLAM HCL 2 MG/2ML IJ SOLN
INTRAMUSCULAR | Status: DC | PRN
  Administered 2011-09-07: 2 mg via INTRAVENOUS

## 2011-09-07 MED ORDER — SODIUM CHLORIDE 0.9 % IJ SOLN: 3.0000 mL | INTRAMUSCULAR | Status: DC | PRN

## 2011-09-07 MED ORDER — MIDAZOLAM HCL 2 MG/2ML IJ SOLN
INTRAMUSCULAR | Status: AC
  Filled 2011-09-07: qty 2

## 2011-09-07 MED ORDER — EPHEDRINE 5 MG/ML INJ
10.0000 mg | INTRAVENOUS | Status: DC | PRN
  Filled 2011-09-07: qty 4

## 2011-09-07 MED ORDER — CEFAZOLIN SODIUM 1-5 GM-% IV SOLN
INTRAVENOUS | Status: AC
  Filled 2011-09-07: qty 50

## 2011-09-07 MED ORDER — HYDROMORPHONE HCL PF 1 MG/ML IJ SOLN
INTRAMUSCULAR | Status: DC | PRN
  Administered 2011-09-07: 1 mg via INTRAVENOUS

## 2011-09-07 MED ORDER — TETANUS-DIPHTH-ACELL PERTUSSIS 5-2.5-18.5 LF-MCG/0.5 IM SUSP: 0.5000 mL | Freq: Once | INTRAMUSCULAR | Status: DC

## 2011-09-07 MED ORDER — LACTATED RINGERS IV SOLN: 500.0000 mL | Freq: Once | INTRAVENOUS | Status: DC

## 2011-09-07 MED ORDER — OXYTOCIN 20 UNITS IN LACTATED RINGERS INFUSION - SIMPLE: 125.0000 mL/h | INTRAVENOUS | Status: AC

## 2011-09-07 MED ORDER — FENTANYL CITRATE 0.05 MG/ML IJ SOLN: 25.0000 ug | INTRAMUSCULAR | Status: DC | PRN

## 2011-09-07 MED ORDER — DIPHENHYDRAMINE HCL 50 MG/ML IJ SOLN: 12.5000 mg | INTRAMUSCULAR | Status: DC | PRN

## 2011-09-07 MED ORDER — KETAMINE HCL 10 MG/ML IJ SOLN
INTRAMUSCULAR | Status: DC | PRN
  Administered 2011-09-07 (×7): 20 mg via INTRAVENOUS

## 2011-09-07 MED ORDER — KETOROLAC TROMETHAMINE 30 MG/ML IJ SOLN
30.0000 mg | Freq: Four times a day (QID) | INTRAMUSCULAR | Status: AC | PRN
  Administered 2011-09-07 (×2): 30 mg via INTRAVENOUS

## 2011-09-07 MED ORDER — ONDANSETRON HCL 4 MG/2ML IJ SOLN
INTRAMUSCULAR | Status: DC | PRN
  Administered 2011-09-07: 4 mg via INTRAVENOUS

## 2011-09-07 MED ORDER — CEFAZOLIN SODIUM-DEXTROSE 1-4 GM-% IV SOLR
1.0000 g | Freq: Once | INTRAVENOUS | Status: AC
  Administered 2011-09-07: 1 g via INTRAVENOUS
  Filled 2011-09-07: qty 50

## 2011-09-07 MED ORDER — PRENATAL MULTIVITAMIN CH
1.0000 | ORAL_TABLET | Freq: Every day | ORAL | Status: DC
  Administered 2011-09-08: 1 via ORAL
  Filled 2011-09-07: qty 1

## 2011-09-07 MED ORDER — SCOPOLAMINE 1 MG/3DAYS TD PT72
1.0000 | MEDICATED_PATCH | Freq: Once | TRANSDERMAL | Status: DC
  Administered 2011-09-07: 1.5 mg via TRANSDERMAL

## 2011-09-07 MED ORDER — DIPHENHYDRAMINE HCL 50 MG/ML IJ SOLN: 25.0000 mg | INTRAMUSCULAR | Status: DC | PRN

## 2011-09-07 MED ORDER — PRENATAL RX 60-1 MG PO TABS: 1.0000 | ORAL_TABLET | Freq: Every day | ORAL | Status: DC

## 2011-09-07 MED ORDER — FENTANYL CITRATE 0.05 MG/ML IJ SOLN
INTRAMUSCULAR | Status: AC
  Filled 2011-09-07: qty 2

## 2011-09-07 MED ORDER — SODIUM CHLORIDE 0.9 % IR SOLN
Status: DC | PRN
  Administered 2011-09-07: 1000 mL

## 2011-09-07 MED ORDER — SODIUM BICARBONATE 8.4 % IV SOLN
INTRAVENOUS | Status: DC | PRN
  Administered 2011-09-07: 5 mL via EPIDURAL

## 2011-09-07 MED ORDER — OXYCODONE-ACETAMINOPHEN 5-325 MG PO TABS
1.0000 | ORAL_TABLET | ORAL | Status: DC | PRN
  Administered 2011-09-08: 1 via ORAL
  Administered 2011-09-08: 2 via ORAL
  Administered 2011-09-08 (×4): 1 via ORAL
  Administered 2011-09-09 (×2): 2 via ORAL
  Filled 2011-09-07 (×2): qty 1
  Filled 2011-09-07: qty 2
  Filled 2011-09-07 (×2): qty 1
  Filled 2011-09-07 (×3): qty 2

## 2011-09-07 MED ORDER — LANOLIN HYDROUS EX OINT: 1.0000 "application " | TOPICAL_OINTMENT | CUTANEOUS | Status: DC | PRN

## 2011-09-07 MED ORDER — HYDROMORPHONE HCL PF 1 MG/ML IJ SOLN
INTRAMUSCULAR | Status: AC
  Filled 2011-09-07: qty 1

## 2011-09-07 MED ORDER — DIPHENHYDRAMINE HCL 25 MG PO CAPS: 25.0000 mg | ORAL_CAPSULE | Freq: Four times a day (QID) | ORAL | Status: DC | PRN

## 2011-09-07 MED ORDER — DIPHENHYDRAMINE HCL 25 MG PO CAPS
25.0000 mg | ORAL_CAPSULE | ORAL | Status: DC | PRN
  Administered 2011-09-08: 25 mg via ORAL
  Filled 2011-09-07: qty 1

## 2011-09-07 MED ORDER — KETOROLAC TROMETHAMINE 30 MG/ML IJ SOLN
INTRAMUSCULAR | Status: AC
  Administered 2011-09-07: 30 mg via INTRAVENOUS
  Filled 2011-09-07: qty 1

## 2011-09-07 MED ORDER — SENNOSIDES-DOCUSATE SODIUM 8.6-50 MG PO TABS
2.0000 | ORAL_TABLET | Freq: Every day | ORAL | Status: DC
  Administered 2011-09-08: 2 via ORAL

## 2011-09-07 MED ORDER — SIMETHICONE 80 MG PO CHEW: 80.0000 mg | CHEWABLE_TABLET | ORAL | Status: DC | PRN

## 2011-09-07 MED ORDER — NALOXONE HCL 0.4 MG/ML IJ SOLN
1.0000 ug/kg/h | INTRAMUSCULAR | Status: DC | PRN
  Filled 2011-09-07: qty 2.5

## 2011-09-07 MED ORDER — CEFAZOLIN SODIUM 1-5 GM-% IV SOLN
INTRAVENOUS | Status: DC | PRN
  Administered 2011-09-07: 1 g via INTRAVENOUS

## 2011-09-07 MED ORDER — FENTANYL CITRATE 0.05 MG/ML IJ SOLN
INTRAMUSCULAR | Status: DC | PRN
  Administered 2011-09-07 (×2): 50 ug via INTRAVENOUS

## 2011-09-07 MED ORDER — MEPERIDINE HCL 25 MG/ML IJ SOLN: 6.2500 mg | INTRAMUSCULAR | Status: DC | PRN

## 2011-09-07 MED ORDER — OXYTOCIN 20 UNITS IN LACTATED RINGERS INFUSION - SIMPLE
INTRAVENOUS | Status: DC | PRN
  Administered 2011-09-07 (×2): 20 [IU] via INTRAVENOUS

## 2011-09-07 MED ORDER — KETOROLAC TROMETHAMINE 30 MG/ML IJ SOLN
30.0000 mg | Freq: Four times a day (QID) | INTRAMUSCULAR | Status: AC | PRN
  Filled 2011-09-07: qty 1

## 2011-09-07 MED ORDER — KETAMINE HCL 100 MG/ML IJ SOLN
INTRAMUSCULAR | Status: AC
  Filled 2011-09-07: qty 1

## 2011-09-07 MED ORDER — LACTATED RINGERS IV SOLN
INTRAVENOUS | Status: DC
  Administered 2011-09-07 – 2011-09-08 (×2): via INTRAVENOUS

## 2011-09-07 MED ORDER — ONDANSETRON HCL 4 MG PO TABS: 4.0000 mg | ORAL_TABLET | ORAL | Status: DC | PRN

## 2011-09-07 MED ORDER — EPHEDRINE 5 MG/ML INJ: 10.0000 mg | INTRAVENOUS | Status: DC | PRN

## 2011-09-07 MED ORDER — ONDANSETRON HCL 4 MG/2ML IJ SOLN: 4.0000 mg | INTRAMUSCULAR | Status: DC | PRN

## 2011-09-07 MED ORDER — NALBUPHINE HCL 10 MG/ML IJ SOLN
5.0000 mg | INTRAMUSCULAR | Status: DC | PRN
  Administered 2011-09-07 (×2): 5 mg via INTRAVENOUS
  Filled 2011-09-07 (×3): qty 1

## 2011-09-07 MED ORDER — NALBUPHINE HCL 10 MG/ML IJ SOLN
5.0000 mg | INTRAMUSCULAR | Status: DC | PRN
  Filled 2011-09-07: qty 1

## 2011-09-07 MED ORDER — IBUPROFEN 600 MG PO TABS: 600.0000 mg | ORAL_TABLET | Freq: Four times a day (QID) | ORAL | Status: DC | PRN

## 2011-09-07 SURGICAL SUPPLY — 35 items
ADH SKN CLS APL DERMABOND .7 (GAUZE/BANDAGES/DRESSINGS) ×2
CLOTH BEACON ORANGE TIMEOUT ST (SAFETY) ×2 IMPLANT
DERMABOND ADVANCED (GAUZE/BANDAGES/DRESSINGS) ×2
DERMABOND ADVANCED .7 DNX12 (GAUZE/BANDAGES/DRESSINGS) ×2 IMPLANT
DRSG COVADERM 4X10 (GAUZE/BANDAGES/DRESSINGS) ×1 IMPLANT
DURAPREP 26ML APPLICATOR (WOUND CARE) ×4 IMPLANT
ELECT REM PT RETURN 9FT ADLT (ELECTROSURGICAL) ×2
ELECTRODE REM PT RTRN 9FT ADLT (ELECTROSURGICAL) ×1 IMPLANT
EXTRACTOR VACUUM BELL STYLE (SUCTIONS) IMPLANT
GLOVE BIOGEL PI IND STRL 8 (GLOVE) ×2 IMPLANT
GLOVE BIOGEL PI INDICATOR 8 (GLOVE) ×2
GLOVE ECLIPSE 8.0 STRL XLNG CF (GLOVE) ×2 IMPLANT
KIT ABG SYR 3ML LUER SLIP (SYRINGE) ×2 IMPLANT
NDL HYPO 25X5/8 SAFETYGLIDE (NEEDLE) ×1 IMPLANT
NEEDLE HYPO 25X5/8 SAFETYGLIDE (NEEDLE) ×2 IMPLANT
NS IRRIG 1000ML POUR BTL (IV SOLUTION) ×2 IMPLANT
PACK C SECTION WH (CUSTOM PROCEDURE TRAY) ×2 IMPLANT
PAD ABD 7.5X8 STRL (GAUZE/BANDAGES/DRESSINGS) ×1 IMPLANT
RTRCTR C-SECT PINK 25CM LRG (MISCELLANEOUS) IMPLANT
SLEEVE SCD COMPRESS KNEE MED (MISCELLANEOUS) IMPLANT
STAPLER VISISTAT 35W (STAPLE) IMPLANT
SUT CHROMIC 0 CT 1 (SUTURE) ×3 IMPLANT
SUT CHROMIC 0 CTX 36 (SUTURE) ×4 IMPLANT
SUT MNCRL 0 VIOLET CTX 36 (SUTURE) ×2 IMPLANT
SUT MONOCRYL 0 CTX 36 (SUTURE) ×2
SUT PLAIN 2 0 (SUTURE) ×6
SUT PLAIN 2 0 XLH (SUTURE) IMPLANT
SUT PLAIN ABS 2-0 CT1 27XMFL (SUTURE) IMPLANT
SUT VIC AB 0 CTX 36 (SUTURE) ×6
SUT VIC AB 0 CTX36XBRD ANBCTRL (SUTURE) ×1 IMPLANT
SUT VIC AB 4-0 KS 27 (SUTURE) ×1 IMPLANT
TAPE CLOTH SURG 4X10 WHT LF (GAUZE/BANDAGES/DRESSINGS) ×1 IMPLANT
TOWEL OR 17X24 6PK STRL BLUE (TOWEL DISPOSABLE) ×4 IMPLANT
TRAY FOLEY CATH 14FR (SET/KITS/TRAYS/PACK) IMPLANT
WATER STERILE IRR 1000ML POUR (IV SOLUTION) ×2 IMPLANT

## 2011-09-07 NOTE — Anesthesia Postprocedure Evaluation (Signed)
  Anesthesia Post-op Note  Patient: Candice [name]  Procedure(s) Performed: Procedure(s) (LRB): CESAREAN SECTION (N/A)  Patient Location: PACU and Mother/Baby  Anesthesia Type: Epidural  Level of Consciousness: awake, alert  and oriented  Airway and Oxygen Therapy: Patient Spontanous Breathing   Post-op Assessment: Patient's Cardiovascular Status Stable and Respiratory Function Stable  Post-op Vital Signs: stable  Complications: No apparent anesthesia complications

## 2011-09-07 NOTE — Progress Notes (Signed)
Kashana L Stjulien is a 26 y.o. G2P1001 at [redacted]w[redacted]d by LMP admitted for induction of labor due to Post dates. Due date 08-30-11.  Subjective: Sleeping comfortably after epidural.   Objective: BP 121/69  Pulse 94  Temp(Src) 98.6 F (37 C) (Oral)  Resp 18  Ht 5\' 3"  (1.6 m)  Wt 249 lb (112.946 kg)  BMI 44.11 kg/m2  SpO2 98%  LMP 11/23/2010      FHT:  FHR: 120-125 bpm, variability: moderate,  accelerations:  Present,  decelerations:  Present : Variables UC:   regular, every 2-3 minutes SVE:   Dilation: 8 Effacement (%): 90 Station: -1 Exam by:: Dr. Fara Boros  Labs: Lab Results  Component Value Date   WBC 14.5* 09/06/2011   HGB 12.0 09/06/2011   HCT 37.1 09/06/2011   MCV 82.8 09/06/2011   PLT 292 09/06/2011    Assessment / Plan: Induction of labor due to postterm,  progressing well on pitocin  Labor: progressing on pitocin, will insert IUPC to monitor for adequacy of contractions.  Fetal Wellbeing:  Category I Pain Control:  Epidural I/D:  n/a Anticipated MOD:  NSVD  Bodi Palmeri 09/07/2011, 6:14 AM

## 2011-09-07 NOTE — Addendum Note (Signed)
Addendum  created 09/07/11 1559 by Tyrone Apple. Malen Gauze, MD   Modules edited:Charting, Inpatient Notes

## 2011-09-07 NOTE — Addendum Note (Signed)
Addendum  created 09/07/11 1918 by Len Blalock, CRNA   Modules edited:Notes Section

## 2011-09-07 NOTE — Progress Notes (Signed)
Patient found to be unchaged despite adequate labor.  FHR tracing is normal and reassuring.  Discussed with patient and family.  Will proceed with repeat caesarean section and bilateral tubal ligation  Elonda Giuliano H 09/07/2011 12:07 PM

## 2011-09-07 NOTE — Addendum Note (Signed)
Addendum  created 09/07/11 1559 by Dekari Bures A. Shirelle Tootle, MD   Modules edited:Charting, Inpatient Notes    

## 2011-09-07 NOTE — Progress Notes (Signed)
Candice [name] is a 26 y.o. G2P1001 at [redacted]w[redacted]d by LMP admitted for induction of labor due to Post dates. Due date 08-30-11.  Subjective: Sleeping comfortably after epidural.   Objective: BP 134/80  Pulse 108  Temp(Src) 98.6 F (37 C) (Oral)  Resp 18  Ht 5\' 3"  (1.6 m)  Wt 249 lb (112.946 kg)  BMI 44.11 kg/m2  SpO2 98%  LMP 11/23/2010      FHT:  FHR: 120-125 bpm, variability: minimal ,  accelerations:  Present,  decelerations:  Absent UC:   regular, every 2-5 minutes SVE:   Dilation: 8 Effacement (%): 90 Station: -1 Exam by:: Dr. Fara Boros  Labs: Lab Results  Component Value Date   WBC 14.5* 09/06/2011   HGB 12.0 09/06/2011   HCT 37.1 09/06/2011   MCV 82.8 09/06/2011   PLT 292 09/06/2011    Assessment / Plan: Induction of labor due to postterm,  progressing well on pitocin  Labor: progressing on pitocin, now adequate ctx with IUPC on pit at 16 Fetal Wellbeing:  Category I Pain Control:  Epidural I/D:  n/a Anticipated MOD:  NSVD  [name] 09/07/2011, 9:02 AM

## 2011-09-07 NOTE — Anesthesia Procedure Notes (Signed)
Epidural Patient location during procedure: OB Start time: 09/07/2011 5:07 AM Reason for block: procedure for pain  Staffing Performed by: anesthesiologist   Preanesthetic Checklist Completed: patient identified, site marked, surgical consent, pre-op evaluation, timeout performed, IV checked, risks and benefits discussed and monitors and equipment checked  Epidural Patient position: sitting Prep: site prepped and draped and DuraPrep Patient monitoring: continuous pulse ox and blood pressure Approach: midline Injection technique: LOR air  Needle:  Needle type: Tuohy  Needle gauge: 17 G Needle length: 9 cm Catheter type: closed end flexible Catheter size: 19 Gauge Catheter at skin depth: 12 cm Test dose: negative  Assessment Events: blood not aspirated, injection not painful, no injection resistance, negative IV test and no paresthesia  Additional Notes Discussed risk of headache, infection, bleeding, nerve injury and failed or incomplete block.  Patient voices understanding and wishes to proceed.  LOR at 5.5, epid cath withdrawn to 10.5 cm at skin, 12 cm at skin after sitting back up.

## 2011-09-07 NOTE — Anesthesia Preprocedure Evaluation (Addendum)
Anesthesia Evaluation  Patient identified by MRN, date of birth, ID band Patient awake    Reviewed: Allergy & Precautions, H&P , NPO status , Patient's Chart, lab work & pertinent test results, reviewed documented beta blocker date and time   Airway Mallampati: III TM Distance: >3 FB Neck ROM: full    Dental  (+) Teeth Intact   Pulmonary Current Smoker,  breath sounds clear to auscultation        Cardiovascular negative cardio ROS  Rhythm:regular Rate:Normal     Neuro/Psych negative neurological ROS  negative psych ROS   GI/Hepatic negative GI ROS, (+)     substance abuse  marijuana use,   Endo/Other  Morbid obesity  Renal/GU negative Renal ROS  negative genitourinary   Musculoskeletal   Abdominal   Peds  Hematology negative hematology ROS (+)   Anesthesia Other Findings   Reproductive/Obstetrics (+) Pregnancy (h/o C/S x1, now TOLAC)                          Anesthesia Physical Anesthesia Plan  ASA: III and Emergent  Anesthesia Plan: Epidural   Post-op Pain Management:    Induction:   Airway Management Planned:   Additional Equipment:   Intra-op Plan:   Post-operative Plan:   Informed Consent: I have reviewed the patients History and Physical, chart, labs and discussed the procedure including the risks, benefits and alternatives for the proposed anesthesia with the patient or authorized representative who has indicated his/her understanding and acceptance.     Plan Discussed with:   Anesthesia Plan Comments:        Anesthesia Quick Evaluation

## 2011-09-07 NOTE — Op Note (Signed)
Preoperative diagnosis:  1.  Intrauterine pregnancy at 41 1/[redacted] weeks gestation                                         2.  Secondary arrest of dilatation and descent in the first stage of labor                                         3.  Desires sterilization   Postoperative diagnosis:  Same as above   Procedure:  Repeat cesarean section  Surgeon:  Lazaro Arms MD  Assistant:  McGill MD  Anesthesia: Spinal  Findings:  Patient was admitted for IOL for post dates.  She had a previous caesarean section 4 years ago for non reassuring FHR tracing.  Today she has had several hours of adequate labor and no  Cervical change.  On exam it felt as if the fetal vertex was asynclitic and posterior.  At the time of surgery that was confirmed.   Over a low transverse incision was delivered a viable female with Apgars of 9 and 9 weighing 6 lbs. 14 oz. Uterus, tubes and ovaries were all normal.  There were no other significant findings  Description of operation:  Patient was taken to the operating room andhad her epidural dosed up.  She was then placed in the supine position with tilt to the left side. When adequate anesthetic level was obtained she was prepped and draped in usual sterile fashion.   A Foley catheter was placed previously. A Pfannenstiel skin incision was made and carried down sharply to the rectus fascia which was scored in the midline extended laterally. The fascia was taken off the muscles both superiorly and without difficulty. The muscles were divided.  The peritoneal cavity was entered.  Bladder blade was placed, no bladder flap was created.  A low transverse hysterotomy incision was made and delivered a viable female  infant at  with Apgars of 9 and 9 weighing6 lbs 14 oz.  Cord pH was obtained and was 7.37. The uterus was exteriorized. It was closed in 2 layers, the first being a running interlocking layer and the second being an imbricating layer using 0 monocryl on a CTX needle. There was good  resulting hemostasis. The uterus tubes and ovaries were all normal.   A modified Pomeroy bilateral tubal ligation was performed in the usual fashion bilaterally.  Because of the patient's level of discomfort i did the ligation with the uterus in situ.  However the parametrialvessels pulled through and I had to place several sutures above and below the area of bleeding.  Hemostasis was achieved and maintained.  Peritoneal cavity was irrigated vigorously. The muscles and peritoneum were reapproximated loosely. The fascia was closed using 0 Vicryl in running fashion. Subcutaneous tissue was made hemostatic and irrigated. The skin was closed using staples.  Blood loss for the procedure was 800 cc. The patient received a gram of Ancef prophylactically. The patient was taken to the recovery room in good stable condition with all counts being correct x3.  EBL 800 cc  Because of the repair to the right parametrial vessels I will check a blood count today and repeat it tomorrow am.  Lazaro Arms 09/07/2011 2:22 PM

## 2011-09-07 NOTE — Anesthesia Postprocedure Evaluation (Signed)
  Anesthesia Post-op Note  Patient: [name]  Procedure(s) Performed: Procedure(s) (LRB): CESAREAN SECTION (N/A)  Patient Location: PACU  Anesthesia Type: Epidural  Level of Consciousness: awake, alert  and oriented  Airway and Oxygen Therapy: Patient Spontanous Breathing  Post-op Pain: none  Post-op Assessment: Post-op Vital signs reviewed, Patient's Cardiovascular Status Stable, Respiratory Function Stable, Patent Airway, No signs of Nausea or vomiting, Pain level controlled, No headache and No backache  Post-op Vital Signs: Reviewed and stable  Complications: No apparent anesthesia complications

## 2011-09-07 NOTE — Progress Notes (Signed)
Medina L Sabet is a 26 y.o. G2P1001 at [redacted]w[redacted]d by LMP admitted for induction of labor due to Post dates. Due date 3913.  Subjective: Doing well, IV fentanyl helping with pain.   Objective: BP 140/87  Pulse 108  Temp(Src) 98.3 F (36.8 C) (Oral)  Resp 20  Ht 5\' 3"  (1.6 m)  Wt 249 lb (112.946 kg)  BMI 44.11 kg/m2  LMP 11/23/2010     FHT:  FHR: 120 bpm, variability: moderate,  accelerations:  Present,  decelerations:  Present : Variables UC:   regular, every 2 minutes SVE:   Dilation: 7.5 Effacement (%): 80 Station: -2 Exam by:: Dr. Fara Boros  Labs: Lab Results  Component Value Date   WBC 14.5* 09/06/2011   HGB 12.0 09/06/2011   HCT 37.1 09/06/2011   MCV 82.8 09/06/2011   PLT 292 09/06/2011    Assessment / Plan: Induction of labor due to postterm,  progressing well on pitocin  Labor: Progressing on pitocin, AROM'ed, clear fluid Preeclampsia:  N/A Fetal Wellbeing:  Category I Pain Control:  Fentanyl I/D:  n/a Anticipated MOD:  NSVD  Shaquaya Wuellner 09/07/2011, 12:30 AM

## 2011-09-07 NOTE — Progress Notes (Signed)
Candice [name] is a 26 y.o. G2P1001 at [redacted]w[redacted]d admitted for induction of labor due to Post dates. Due date 08/30/11.  Subjective: Feeling a little more vaginal pressure, but still comfortable with epidural.  Unable to feel legs currently   Objective: BP 128/77  Pulse 113  Temp(Src) 98.2 F (36.8 C) (Oral)  Resp 18  Ht 5\' 3"  (1.6 m)  Wt 249 lb (112.946 kg)  BMI 44.11 kg/m2  SpO2 98%  LMP 11/23/2010      FHT:  FHR: 130 bpm, variability: moderate,  accelerations:  Present,  decelerations:  Present : occasional variables UC:   regular, every 2-4 minutes SVE:   Dilation: 9 Effacement (%): 90 Station: 0 Exam by:: Jumar Greenstreet  Labs: Lab Results  Component Value Date   WBC 14.5* 09/06/2011   HGB 12.0 09/06/2011   HCT 37.1 09/06/2011   MCV 82.8 09/06/2011   PLT 292 09/06/2011    Assessment / Plan: Induction of labor due to postterm,  progressing well on pitocin  Labor: Progressing on pit, MVUs adequate Fetal Wellbeing:  Category I Pain Control:  Epidural I/D:  n/a Anticipated MOD:  NSVD  Joab Carden 09/07/2011, 9:46 AM

## 2011-09-07 NOTE — OR Nursing (Signed)
Uterus massaged by S. Jaritza Duignan Charity fundraiser. Two tubes of cord blood to lab. Foley catheter in place upon arrival to OR. Urine color -concentrated.

## 2011-09-07 NOTE — Progress Notes (Signed)
Candice [name] is a 26 y.o. G2P1001 at [redacted]w[redacted]d by LMP admitted for induction of labor due to Post dates. Due date 3913.  Subjective: Doing well, IV fentanyl helping with pain, needing to breath through contractions.  Was feeling some pressure but cervix unchanged by RN.   Objective: BP 128/78  Pulse 85  Temp(Src) 98.1 F (36.7 C) (Oral)  Resp 20  Ht 5\' 3"  (1.6 m)  Wt 249 lb (112.946 kg)  BMI 44.11 kg/m2  LMP 11/23/2010     FHT:  FHR: 120-125 bpm, variability: moderate,  accelerations:  Present,  decelerations:  Present : Variables UC:   regular, every 2 minutes SVE:   Dilation: 7 Effacement (%): 80 Station: -2 Exam by:: [employer] RN  Labs: Lab Results  Component Value Date   WBC 14.5* 09/06/2011   HGB 12.0 09/06/2011   HCT 37.1 09/06/2011   MCV 82.8 09/06/2011   PLT 292 09/06/2011    Assessment / Plan: Induction of labor due to postterm,  progressing well on pitocin, slowly increasing  Labor: Progressing on pitocin, AROM'ed, clear fluid Preeclampsia:  N/A Fetal Wellbeing:  Category I Pain Control:  Fentanyl I/D:  n/a Anticipated MOD:  NSVD  Brendt Dible 09/07/2011, 3:11 AM

## 2011-09-07 NOTE — Transfer of Care (Signed)
Immediate Anesthesia Transfer of Care Note  Patient: Candice [name]  Procedure(s) Performed: Procedure(s) (LRB): CESAREAN SECTION (N/A)  Patient Location: PACU  Anesthesia Type: Epidural  Level of Consciousness: awake, alert  and oriented  Airway & Oxygen Therapy: Patient Spontanous Breathing  Post-op Assessment: Report given to PACU RN and Post -op Vital signs reviewed and stable  Post vital signs: stable  Complications: No apparent anesthesia complications

## 2011-09-08 ENCOUNTER — Encounter (HOSPITAL_COMMUNITY): Payer: Self-pay

## 2011-09-08 LAB — CBC
Platelets: 240 10*3/uL (ref 150–400)
RBC: 3.66 MIL/uL — ABNORMAL LOW (ref 3.87–5.11)
RDW: 16.4 % — ABNORMAL HIGH (ref 11.5–15.5)
WBC: 18.7 10*3/uL — ABNORMAL HIGH (ref 4.0–10.5)

## 2011-09-08 LAB — RPR: RPR Ser Ql: NONREACTIVE

## 2011-09-08 NOTE — Progress Notes (Signed)
Clinical Social Work Department PSYCHOSOCIAL ASSESSMENT - MATERNAL/CHILD 09/08/2011  Patient:  Candice [name], Candice [name]  Account Number:  [id number]  Admit Date:  09/06/2011  Marjo Bicker Name:   Fidel Levy Teague-Grenfell    Clinical Social Worker:  Andy Gauss   Date/Time:  09/08/2011 11:00 AM  Date Referred:  09/08/2011   Referral source  CN     Referred reason  Substance Abuse  Depression/Anxiety   Other referral source:    I:  FAMILY / HOME ENVIRONMENT Child's legal guardian:  PARENT  Guardian - Name Guardian - Age Guardian - Address  Layton Tappan [address] [place], [place] [address]  Mora Appl Teague 25 (same as above)   Other household support members/support persons Name Relationship DOB   Huel Cote [birth year]   Other support:    II  PSYCHOSOCIAL DATA Information Source:    Insurance claims handler Resources Employment:   Development worker, international aid resources:  Media planner If [employer]:   Other  [facility]   School / Grade:   Maternity Care Coordinator / Child Services Coordination / Early Interventions:  Cultural issues impacting care:    III  STRENGTHS  Strength comment:    IV  RISK FACTORS AND CURRENT PROBLEMS No Problem Noted:  Y Risk Factor & Current Problem Patient Issue Family Issue Risk Factor / Current Problem Comment  Substance Abuse Y N History of MJ use  Mental Illness Y N History of Depression    V  SOCIAL WORK ASSESSMENT Sw met with pt to assess history of MJ use and depression. Pt admits to smoking MJ "every other day," prior to pregnancy confirmation at 6-8weeks.  Once pregnancy was confirmed, she slowed down and estimates that she reduced use to "once a week."  Pt reports that she completely stopped in 7th or 8th month.  She denies other illegal substance use.  Sw explained hospital drug testing policy. UDS is negative, meconium results are pending.  Pt states she was diagnosed with depression at age 67 as a result of  "rough" childhood.  She did experience some depression at the beginning of the pregnancy.  Pt's depression was situational but she did not care to elaborate.  She did not take medication or receive therapy.  Pt states feels fine now and is not currently depressed.  Sw discussed PP depression signs/symptoms with pt and encouraged her to contact her medical provider if needed.  Pt appears appropriate and attentive to infant.  Sw will assist further if needed.      VI SOCIAL WORK PLAN Social Work Plan  No Further Intervention Required / No Barriers to Discharge   Type of pt/family education:   If child protective services report - county:   If child protective services report - date:   Information/referral to community resources comment:   Feelings After Birth brochure   Other social work plan:

## 2011-09-08 NOTE — Progress Notes (Signed)
Patient ID: Candice [name], female   DOB: 1986/01/05, 26 y.o.   MRN: [id number] Subjective: Postpartum Day 1: Cesarean Delivery 09/07/11 Patient reports incisional pain and tolerating PO.  Still has foley in, will d/c.  Some pain with movement at incision site, but better with percocet.  No flatus yet.  No dizziness with standing.  Objective: Vital signs in last 24 hours: Temp:  [97.4 F (36.3 C)-98.5 F (36.9 C)] 98.4 F (36.9 C) (03/18 0405) Pulse Rate:  [99-129] 125  (03/18 0405) Resp:  [18-26] 18  (03/18 0405) BP: (115-158)/(58-97) 120/78 mmHg (03/18 0405) SpO2:  [94 %-100 %] 95 % (03/18 0405)  Physical Exam:  General: alert and cooperative Lochia: appropriate Uterine Fundus: firm Incision: healing well, no significant drainage, no significant erythema DVT Evaluation: No evidence of DVT seen on physical exam.   Basename 09/08/11 0600 09/07/11 1800  HGB 9.9* 11.2*  HCT 29.8* 34.4*    Assessment/Plan: Status post Cesarean section. Doing well postoperatively.  Continue current care.  Will d/c foley.  Patient will f/u with me in 1 week for incision check/staple removal, then again for normal 6 week post-partum check. Lactation to revisit mom, breast feeding going well.   Candice [name] 09/08/2011, 7:53 AM

## 2011-09-09 MED ORDER — SENNOSIDES-DOCUSATE SODIUM 8.6-50 MG PO TABS: 2.0000 | ORAL_TABLET | Freq: Every day | ORAL | Status: DC

## 2011-09-09 MED ORDER — IBUPROFEN 600 MG PO TABS: 600.0000 mg | ORAL_TABLET | Freq: Four times a day (QID) | ORAL | Status: AC | PRN

## 2011-09-09 MED ORDER — OXYCODONE-ACETAMINOPHEN 5-325 MG PO TABS: 1.0000 | ORAL_TABLET | ORAL | Status: AC | PRN

## 2011-09-09 NOTE — Plan of Care (Signed)
Problem: Discharge Progression Outcomes Goal: Remove staples per MD order Outcome: Not Met (add Reason) Staple removal in office     

## 2011-09-09 NOTE — Discharge Instructions (Signed)
Cesarean Delivery Care After Refer to this sheet in the next few weeks. These instructions provide you with information on caring for yourself after your procedure. Your caregiver may also give you more specific instructions. Your treatment has been planned according to current medical practices, but problems sometimes occur. Call your caregiver if you have any problems or questions after your procedure. HOME CARE INSTRUCTIONS Healing will take time. You will have discomfort, tenderness, swelling, and bruising at the surgery site for a couple of weeks. This is normal and will get better as time goes on. Activity  Rest as much as possible the first 2 weeks.   When possible, have someone help you with your household activities and your baby for 2 to 3 weeks.   Limit your housework and social activity. Increase your activity gradually as your strength returns.   Do not climb stairs more than 2 to 3 times a day.   Do not lift anything heavier than your baby.   Follow your caregiver's instructions about driving a car.   Exercise only as directed by your caregiver.  Nutrition  You may return to your usual diet. Eat a well-balanced diet.   Drink enough fluid to keep your urine clear or pale yellow.   Keep taking your prenatal or multivitamins.   Do not drink alcohol until your caregiver says it is okay.  Elimination You should return to your usual bowel function. If you develop constipation, ask your caregiver about taking a mild laxative that will help you go to the bathroom. Bran foods and fluids help with constipation. Gradually add fruit, vegetables, and bran to your diet.  Hygiene  You may shower, wash your hair, and take tub baths unless your caregiver tells you otherwise.   Continue perineal care until your vaginal bleeding and discharge stops.   Do not douche or use tampons until your caregiver says it is okay.  Fever If you feel feverish or have shaking chills, take your  temperature. The fever may indicate infection. Infections can be treated with antibiotic medicine. Pain Control and Medicine  Only take over-the-counter or prescription medicine as directed by your caregiver. Do not take aspirin. It can cause bleeding.   Do not drive when taking pain medicine.   Talk to your caregiver about restarting or adjusting your normal medicines.  Incision Care  Clean your cut (incision) gently with soap and water, then pat dry.   If your caregiver says it is okay, leave the incision without a bandage (dressing) unless it is draining fluid or irritated.   If you have small adhesive strips across the incision and they do not fall off within 7 days, carefully peel them off.   Check the incision daily for increased redness, drainage, swelling, or separation of skin.   Hug a pillow when coughing or sneezing. This helps to relieve pain.  Vaginal Care You may have a vaginal discharge or bleeding for up to 6 weeks. If the vaginal discharge becomes bright red, bad smelling, heavy in amount, has blood clots, or if you have burning or frequent urination, call your caregiver.If your bleeding slows down and then gets heavier, your body is telling you to slow down and relax more. Sexual Intercourse  Check with your caregiver before resuming sexual activity. Often, after 4 to 6 weeks, if you feel good and are well rested, sexual activity may be resumed. Avoid positions that strain the incision site.   You can become pregnant before you have a period.   If you decide to have sexual intercourse, use birth control if you do not want to become pregnant right away.  Health Practices  Keep all your postpartum appointments as recommended by your caregiver. Generally, your caregiver will want to see you in 2 to 3 weeks.   Continue with your yearly pelvic exams.   Continue monthly self-breast exams and yearly physical exams with a Pap test.  Breast Care  If you are not  breastfeeding and your breasts become tender, hard, or leak milk, you may wear a tight-fitting bra and apply ice to your breasts.   If you are breastfeeding, wear a good support bra.   Call your caregiver if you have breast pain, flu-like symptoms, fever, or hardness and reddening of your breasts.  Postpartum Blues You may have a period of low spirits or "blues" after your baby is born. Discuss your feelings with your partner, family, and friends. This may be caused by the changing hormone levels in your body. You may want to contact your caregiver if this is worrisome. Miscellaneous  Limit wearing support panties or control-top hose.   If you breastfeed, you may not have a period for several months or longer. This is normal for the nursing mother. If you do not menstruate within 6 weeks after you stop breastfeeding, see your caregiver.   If you are not breastfeeding, you can expect to menstruate within 6 to 10 weeks after birth. If you have not started by the 11th week, check with your caregiver.  SEEK MEDICAL CARE IF:   There is swelling, redness, or increasing pain in the wound area.   You have pus coming from the wound.   You notice a bad smell from the wound or surgical dressing.   You have pain, redness, and swelling from the intravenous (IV) site.   Your wound breaks open (the edges are not staying together).   You feel dizzy or feel like fainting.   You develop pain or bleeding when you urinate.   You develop diarrhea.   You develop nausea and vomiting.   You develop abnormal vaginal discharge.   You develop a rash.   You have any type of abnormal reaction or develop an allergy to your medicine.   Your pain is not relieved by your medicine or becomes worse.   Your temperature is 101 F (38.3 C), or is 100.4 F (38 C) taken 2 times in a 4 hour period.  SEEK IMMEDIATE MEDICAL CARE:  You develop a temperature of 102 F (38.9 C) or higher.   You develop abdominal  pain.   You develop chest pain.   You develop shortness of breath.   You faint.   You develop pain, swelling, or redness of your leg.   You develop heavy vaginal bleeding with or without blood clots.  Document Released: 03/01/2002 Document Revised: 05/29/2011 Document Reviewed: 09/04/2010 [facility] Patient Information 2012 [place], [place].  Tubal Ligation Care After Please read the instructions outlined below and refer to this sheet in the next few weeks. These discharge instructions give you general information on care after leaving the hospital. Your surgeon may also give you specific instructions. While treatment has been planned according to the most current medical practices available, unavoidable complications sometimes happen. If you have any problems or questions after discharge, please call your surgeon or caregiver. HOME CARE INSTRUCTIONS  Do not drink alcohol, drive a car, use public transportation, or sign important papers for at least 1 day following surgery.  Only take over-the-counter or prescription medicines for pain, discomfort, or fever as directed by your caregiver.   If your surgery was done as a same-day surgery, make sure you have a responsible adult with you for the first 24 hours following your surgery.   You may resume normal diet and activities as directed.   Change dressings as directed.   Do not douche or engage in sexual intercourse until your surgeon has given you permission.  SEEK MEDICAL CARE IF:   You have redness, swelling, or increasing pain in the wound or wound area.   You have pus coming from the wound.   You have a fever.   You have a bad smell coming from a wound or dressing.   Your wound edges separate after sutures or staples have been removed.   You have increasing abdominal pain.   You have excessive vaginal bleeding.   You have increasing pain in your shoulders or chest.   You have dizzy episodes or fainting while standing.    You have persistent nausea or vomiting.  SEEK IMMEDIATE MEDICAL CARE IF:   You have a rash.   You have a hard time breathing.   You feel you are developing any reaction or side effects to medications taken.  MAKE SURE YOU:   Understand these instructions.   Will watch this condition.   Will get help right away if you are not doing well or get worse.  Document Released: 03/14/2004 Document Revised: 05/29/2011 Document Reviewed: 09/02/2007 [facility] Patient Information 2012 [place], [place].

## 2011-09-09 NOTE — Discharge Summary (Signed)
Agree with above note.  Candice [name] 09/09/2011 11:08 AM   

## 2011-09-09 NOTE — Discharge Summary (Signed)
  Obstetric Discharge Summary Reason for Admission: induction of labor Prenatal Procedures: NST and ultrasound Intrapartum Procedures: cesarean: low cervical, transverse and tubal ligation Postpartum Procedures: none Complications-Operative and Postpartum: none Hemoglobin  Date Value Range Status  09/08/2011 9.9* 12.0-15.0 (g/dL) Final     HCT  Date Value Range Status  09/08/2011 29.8* 36.0-46.0 (%) Final    Physical Exam:  General: alert, cooperative and no distress Lochia: appropriate Uterine Fundus: firm Incision: healing well, no significant drainage, no dehiscence, no significant erythema DVT Evaluation: No evidence of DVT seen on physical exam.  Discharge Diagnoses: Term Pregnancy-delivered and Post-date pregnancy  Discharge Information: Date: 09/09/2011 Activity: pelvic rest Diet: routine Medications: Ibuprofen, Colace and Percocet Condition: stable Instructions: refer to practice specific booklet Discharge to: home   Newborn Data: Live born female  Birth Weight: 6 lb 14.4 oz (3130 g) APGAR: 8, 9  Home with mother.  Candice [name] 09/09/2011, 8:12 AM

## 2011-09-10 ENCOUNTER — Encounter (HOSPITAL_COMMUNITY): Payer: Self-pay | Admitting: Obstetrics & Gynecology

## 2011-09-12 ENCOUNTER — Inpatient Hospital Stay (HOSPITAL_COMMUNITY)
Admission: AD | Admit: 2011-09-12 | Discharge: 2011-09-12 | Disposition: A | Discharge status: Home / Self Care | Payer: BC Managed Care – PPO | Source: Ambulatory Visit | Attending: Obstetrics and Gynecology | Admitting: Obstetrics and Gynecology

## 2011-09-12 ENCOUNTER — Telehealth: Payer: Self-pay | Admitting: *Deleted

## 2011-09-12 ENCOUNTER — Encounter (HOSPITAL_COMMUNITY): Payer: Self-pay | Admitting: *Deleted

## 2011-09-12 DIAGNOSIS — G8918 Other acute postprocedural pain: Secondary | ICD-10-CM

## 2011-09-12 DIAGNOSIS — F329 Major depressive disorder, single episode, unspecified: Secondary | ICD-10-CM

## 2011-09-12 DIAGNOSIS — R109 Unspecified abdominal pain: Secondary | ICD-10-CM | POA: Insufficient documentation

## 2011-09-12 DIAGNOSIS — B009 Herpesviral infection, unspecified: Secondary | ICD-10-CM

## 2011-09-12 DIAGNOSIS — Z3490 Encounter for supervision of normal pregnancy, unspecified, unspecified trimester: Secondary | ICD-10-CM

## 2011-09-12 DIAGNOSIS — R10811 Right upper quadrant abdominal tenderness: Secondary | ICD-10-CM

## 2011-09-12 DIAGNOSIS — Z98891 History of uterine scar from previous surgery: Secondary | ICD-10-CM

## 2011-09-12 DIAGNOSIS — F32A Depression, unspecified: Secondary | ICD-10-CM

## 2011-09-12 DIAGNOSIS — O99893 Other specified diseases and conditions complicating puerperium: Secondary | ICD-10-CM | POA: Insufficient documentation

## 2011-09-12 LAB — CBC
Hemoglobin: 9.5 g/dL — ABNORMAL LOW (ref 12.0–15.0)
MCH: 26.5 pg (ref 26.0–34.0)
MCV: 82.2 fL (ref 78.0–100.0)
RBC: 3.59 MIL/uL — ABNORMAL LOW (ref 3.87–5.11)

## 2011-09-12 MED ORDER — HYDROMORPHONE HCL 2 MG PO TABS: 2.0000 mg | ORAL_TABLET | ORAL | Status: AC | PRN

## 2011-09-12 MED ORDER — HYDROMORPHONE HCL PF 1 MG/ML IJ SOLN
2.0000 mg | Freq: Once | INTRAMUSCULAR | Status: AC
  Administered 2011-09-12: 2 mg via INTRAMUSCULAR
  Filled 2011-09-12: qty 2

## 2011-09-12 NOTE — Telephone Encounter (Signed)
Delivered by C Section 09/07/2011.   Was given # 30 tabs at discharge. Has been taking 2 tabs every 4 hours. Has 3 tabs left. Has appointment with MD on 03/25. Needs enough to last thru weekend.

## 2011-09-12 NOTE — Discharge Instructions (Signed)
Pain Relief Preoperatively and Postoperatively Being a good patient does not mean being a silent one.If you have questions, problems, or concerns about the pain you may feel after surgery, let your caregiver know.Patients have the right to assessment and management of pain. The treatment of pain after surgery is important to speed up recovery and return to normal activities. Severe pain after surgery, and the fear or anxiety associated with that pain, may cause extreme discomfort that:  Prevents sleep.   Decreases the ability to breathe deeply and cough. This can cause pneumonia or other upper airway infections.   Causes your heart to beat faster and your blood pressure to be higher.   Increases the risk for constipation and bloating.   Decreases the ability of wounds to heal.   May result in depression, increased anxiety, and feelings of helplessness.  Relief of pain before surgery is also important because it will lessen the pain after surgery. Patients who receive both pain relief before and after surgery experience greater pain relief than those who only receive pain relief after surgery. Let your caregiver know if you are having uncontrolled pain.This is very important.Pain after surgery is more difficult to manage if it is permitted to become severe, so prompt and adequate treatment of acute pain is necessary. PAIN CONTROL METHODS Your caregivers follow policies and procedures about the management of patient pain.These guidelines should be explained to you before surgery.Plans for pain control after surgery must be mutually decided upon and instituted with your full understanding and agreement.Do not be afraid to ask questions regarding the care you are receiving.There are many different ways your caregivers will attempt to control your pain, including the following methods. As needed pain control  You may be given pain medicine either through your intravenous (IV) tube, or as a pill  or liquid you can swallow. You will need to let your caregiver know when you are having pain. Then, your caregiver will give you the pain medicine ordered for you.   Your pain medicine may make you constipated. If constipation occurs, drink more liquids if you can. Your caregiver may have you take a mild laxative.  IV patient-controlled analgesia pump (PCA pump)  You can get your pain medicine through the IV tube which goes into your vein. You are able to control the amount of pain medicine that you get. The pain medicine flows in through an IV tube and is controlled by a pump. This pump gives you a set amount of pain medicine when you push the button hooked up to it. Nobody should push this button but you or someone specifically assigned by you to do so. It is set up to keep you from accidentally giving yourself too much pain medicine. You will be able to start using your pain pump in the recovery room after your surgery. This method can be helpful for most types of surgery.   If you are still having too much pain, tell your caregiver. Also, tell your caregiver if you are feeling too sleepy or nauseous.  Continuous epidural pain control  A thin, soft tube (catheter) is put into your back. Pain medicine flows through the catheter to lessen pain in the part of your body where the surgery is done. Continuous epidural pain control may work best for you if you are having surgery on your chest, abdomen, hip area, or legs. The epidural catheter is usually put into your back just before surgery. The catheter is left in until you   can eat and take medicine by mouth. In most cases, this may take 2 to 3 days.   Giving pain medicine through the epidural catheter may help you heal faster because:   Your bowel gets back to normal faster.   You can get back to eating sooner.   You can be up and walking sooner.  Medicine that numbs the area (local anesthetic)  You may receive an injection of pain medicine near  where the pain is (local infiltration).   You may receive an injection of pain medicine near the nerve that controls the sensation to a specific part of the body (peripheral nerve block).   Medicine may be put in the spine to block pain (spinal block).  Opioids  Moderate to moderately severe acute pain after surgery may respond to opioids.Opioids are narcotic pain medicine. Opioids are often combined with non-narcotic medicines to improve pain relief, diminish the risk of side effects, and reduce the chance of addiction.   If you follow your caregiver's directions about taking opioids and you do not have a history of substance abuse, your risk of becoming addicted is exceptionally small.Opioids are given for short periods of time in careful doses to prevent addiction.  Other methods of pain control include:  Steroids.   Physical therapy.   Heat and cold therapy.   Compression, such as wrapping an elastic bandage around the area of pain.   Massage.  These various ways of controlling pain may be used together. Combining different methods of pain control is called multimodal analgesia. Using this approach has many benefits, including being able to eat, move around, and leave the hospital sooner. Document Released: 08/30/2002 Document Revised: 05/29/2011 Document Reviewed: 09/03/2010 [employer] Patient Information 2012 [employer]. 

## 2011-09-12 NOTE — Telephone Encounter (Signed)
Consulted with Dr. Sheffield Slider and he advised that patient needs to contact surgeon at [facility] that did her C Section. Patient advised and she will call.

## 2011-09-12 NOTE — MAU Provider Note (Signed)
History     CSN: [id number]  Arrival date and time: 09/12/11 4098   First Provider Initiated Contact with Patient 09/12/11 1950      Chief Complaint  Patient presents with  . Abdominal Pain   HPI  Pt is here status post csection on 09/07/11 for failed TOLAC, postop course was uneventful.  Pt reports turning over in bed around 1700 and experienced sudden sharp, tearing pain in right upper abdomen.  Denies nausea, vomiting, or bleeding from incision site.  Past Medical History  Diagnosis Date  . No pertinent past medical history   . Depression   . HSV 06/28/2007  . Complication of anesthesia     epidural with last pregnancy made entire left side numb, had to d/c    Past Surgical History  Procedure Date  . Cesarean section   . Iud removal 2005    in cervix  . Cesarean section 09/07/2011    Procedure: CESAREAN SECTION;  Surgeon: Lazaro Arms, MD;  Location: [facility];  Service: Gynecology;  Laterality: N/A;  Repeat cesarean section with delivery of baby boy at 63. Apgars 9/9.  Bilateral tubal ligation.    Family History  Problem Relation Age of Onset  . Alcohol abuse Mother   . Drug abuse Mother   . Alcohol abuse Father   . Mental illness Sister     bipolar ptsd  . Diabetes Maternal Grandmother   . Hypertension Maternal Grandmother   . Cancer Maternal Grandmother     ovarian, breast and lung    History  Substance Use Topics  . Smoking status: Current Everyday Smoker -- 0.3 packs/day    Types: Cigarettes  . Smokeless tobacco: Never Used  . Alcohol Use: No    Allergies: No Known Allergies  Prescriptions prior to admission  Medication Sig Dispense Refill  . ibuprofen (ADVIL,MOTRIN) 600 MG tablet Take 1 tablet (600 mg total) by mouth every 6 (six) hours as needed for pain.  120 tablet  0  . oxyCODONE-acetaminophen (PERCOCET) 5-325 MG per tablet Take 1-2 tablets by mouth every 3 (three) hours as needed (moderate - severe pain).  30 tablet  0  . Prenatal Vit-Fe  Fumarate-FA (PRENATAL MULTIVITAMIN) TABS Take 1 tablet by mouth daily.      Marland Kitchen senna-docusate (SENOKOT-S) 8.6-50 MG per tablet Take 2 tablets by mouth at bedtime.  60 tablet  11    Review of Systems  Unable to perform ROS Gastrointestinal: Positive for abdominal pain.  Musculoskeletal:       Pedal edema   Physical Exam   Blood pressure 123/66, pulse 97, temperature 98.9 F (37.2 C), temperature source Oral, resp. rate 24, last menstrual period 11/23/2010, currently breastfeeding.  Physical Exam  Constitutional: She is oriented to person, place, and time. She appears well-developed and well-nourished. She appears distressed (crying).  HENT:  Head: Normocephalic.  Neck: Normal range of motion. Neck supple.  GI: Soft. She exhibits no mass. There is tenderness ( RUQ). There is no rebound and no guarding.       Incision site healing well; well approximated, no signs of infection; no palpable mass. No drainage from incision site.  Genitourinary: No bleeding around the vagina.  Neurological: She is alert and oriented to person, place, and time. She has normal reflexes.  Skin: Skin is warm and dry.    MAU Course  Procedures  Dilaudid 2mg  IM  Report given to M. Mayford Knife who assumes care of pt. [facility]  Assessment and Plan  [facility] 09/12/2011, 7:56 PM   Examined again by me.  Abdomen soft. Very tender over right half of abdomen, moreso around umbilicus No guarding.  Will check CBC due to Op Note which states there was some bleeding during the tubal ligation. Discussed with Dr Jolayne Panther who agrees.   Results for orders placed during the hospital encounter of 09/12/11 (from the past 24 hour(s))  CBC     Status: Abnormal   Collection Time   09/12/11  8:37 PM      Component Value Range   WBC 12.2 (*) 4.0 - 10.5 (K/uL)   RBC 3.59 (*) 3.87 - 5.11 (MIL/uL)   Hemoglobin 9.5 (*) 12.0 - 15.0 (g/dL)   HCT 40.9 (*) 81.1 - 46.0 (%)   MCV 82.2  78.0 - 100.0 (fL)    MCH 26.5  26.0 - 34.0 (pg)   MCHC 32.2  30.0 - 36.0 (g/dL)   RDW 91.4 (*) 78.2 - 15.5 (%)   Platelets 330  150 - 400 (K/uL)   Dr Jolayne Panther examined patient and thinks this is rectus muscle separation. Hemoglobin is stable from post op value done in hospital.. Will give new Rx for Dilaudid (pt ran out of meds) Precautions reviewed, to come back if worsens or changes.

## 2011-09-12 NOTE — MAU Note (Signed)
Pt reports lying on her L side then trying to turn to her R side. Pain on R side came suddenly and has continued ever since. Pt is s/p C/S on Sunday w/ tubal ligation. Pt reports the pain to be stabbing and burning on R side. Pt also reports that it feels like "things are floating around inside"

## 2011-09-13 NOTE — MAU Provider Note (Signed)
Agree with above note. Patient scheduled to follow up in clinic on Monday for staple removal. Will follow up on her pain as well.  Arvle Grabe 09/13/2011 7:19 AM

## 2011-09-15 ENCOUNTER — Encounter: Payer: Self-pay | Admitting: Family Medicine

## 2011-09-15 ENCOUNTER — Ambulatory Visit (INDEPENDENT_AMBULATORY_CARE_PROVIDER_SITE_OTHER): Payer: BC Managed Care – PPO | Admitting: Family Medicine

## 2011-09-15 VITALS — BP 141/85 | HR 105 | Temp 98.4°F | Ht 63.0 in | Wt 241.0 lb

## 2011-09-15 DIAGNOSIS — Z5189 Encounter for other specified aftercare: Secondary | ICD-10-CM

## 2011-09-15 NOTE — Assessment & Plan Note (Signed)
Clean, dry, intact, staples removed. Red flags for return discussed.

## 2011-09-15 NOTE — Progress Notes (Signed)
S: Pt comes in today for incision check/staple removal. No fevers or chills.  Minimal drainage and tenderness.  Discharge is clear. No erythema or warmth.  Using a diaper or towel to keep area clean and dry.  Has been showering.      ROS: Per HPI  History  Smoking status  . Current Everyday Smoker -- 0.3 packs/day  . Types: Cigarettes  Smokeless tobacco  . Never Used    O:  Filed Vitals:   09/15/11 1352  BP: 141/85  Pulse: 105  Temp: 98.4 F (36.9 C)    Gen: NAD Abd: soft, NT, fundus firm, incision c/d/i with staples present --> remained c/d/i after removal; no drainage or erythema, nontender    A/P: 26 y.o. female p/w incision check 8 days post-op from [facility] -See problem list -f/u in 4-5 weeks for PP visit

## 2011-09-15 NOTE — Patient Instructions (Signed)
We took your staples out today.  Your c-section site looks great. Make sure you keep it clean and dry.  Come back if you start having significant pain or tenderness, drainage, or fevers. Otherwise I will see you in 4-5 weeks for your regular post partum check up.   Cesarean Delivery Care After Refer to this sheet in the next few weeks. These instructions provide you with information on caring for yourself after your procedure. Your caregiver may also give you more specific instructions. Your treatment has been planned according to current medical practices, but problems sometimes occur. Call your caregiver if you have any problems or questions after your procedure. HOME CARE INSTRUCTIONS Healing will take time. You will have discomfort, tenderness, swelling, and bruising at the surgery site for a couple of weeks. This is normal and will get better as time goes on. Activity  Rest as much as possible the first 2 weeks.   When possible, have someone help you with your household activities and your baby for 2 to 3 weeks.   Limit your housework and social activity. Increase your activity gradually as your strength returns.   Do not climb stairs more than 2 to 3 times a day.   Do not lift anything heavier than your baby.   Follow your caregiver's instructions about driving a car.   Exercise only as directed by your caregiver.  Nutrition  You may return to your usual diet. Eat a well-balanced diet.   Drink enough fluid to keep your urine clear or pale yellow.   Keep taking your prenatal or multivitamins.   Do not drink alcohol until your caregiver says it is okay.  Elimination You should return to your usual bowel function. If you develop constipation, ask your caregiver about taking a mild laxative that will help you go to the bathroom. Bran foods and fluids help with constipation. Gradually add fruit, vegetables, and bran to your diet.  Hygiene  You may shower, wash your hair, and  take tub baths unless your caregiver tells you otherwise.   Continue perineal care until your vaginal bleeding and discharge stops.   Do not douche or use tampons until your caregiver says it is okay.  Fever If you feel feverish or have shaking chills, take your temperature. The fever may indicate infection. Infections can be treated with antibiotic medicine. Pain Control and Medicine  Only take over-the-counter or prescription medicine as directed by your caregiver. Do not take aspirin. It can cause bleeding.   Do not drive when taking pain medicine.   Talk to your caregiver about restarting or adjusting your normal medicines.  Incision Care  Clean your cut (incision) gently with soap and water, then pat dry.   If your caregiver says it is okay, leave the incision without a bandage (dressing) unless it is draining fluid or irritated.   If you have small adhesive strips across the incision and they do not fall off within 7 days, carefully peel them off.   Check the incision daily for increased redness, drainage, swelling, or separation of skin.   Hug a pillow when coughing or sneezing. This helps to relieve pain.  Vaginal Care You may have a vaginal discharge or bleeding for up to 6 weeks. If the vaginal discharge becomes bright red, bad smelling, heavy in amount, has blood clots, or if you have burning or frequent urination, call your caregiver.If your bleeding slows down and then gets heavier, your body is telling you to slow down  and relax more. Sexual Intercourse  Check with your caregiver before resuming sexual activity. Often, after 4 to 6 weeks, if you feel good and are well rested, sexual activity may be resumed. Avoid positions that strain the incision site.   You can become pregnant before you have a period. If you decide to have sexual intercourse, use birth control if you do not want to become pregnant right away.  Health Practices  Keep all your postpartum appointments  as recommended by your caregiver. Generally, your caregiver will want to see you in 2 to 3 weeks.   Continue with your yearly pelvic exams.   Continue monthly self-breast exams and yearly physical exams with a Pap test.  Breast Care  If you are not breastfeeding and your breasts become tender, hard, or leak milk, you may wear a tight-fitting bra and apply ice to your breasts.   If you are breastfeeding, wear a good support bra.   Call your caregiver if you have breast pain, flu-like symptoms, fever, or hardness and reddening of your breasts.  Postpartum Blues You may have a period of low spirits or "blues" after your baby is born. Discuss your feelings with your partner, family, and friends. This may be caused by the changing hormone levels in your body. You may want to contact your caregiver if this is worrisome. Miscellaneous  Limit wearing support panties or control-top hose.   If you breastfeed, you may not have a period for several months or longer. This is normal for the nursing mother. If you do not menstruate within 6 weeks after you stop breastfeeding, see your caregiver.   If you are not breastfeeding, you can expect to menstruate within 6 to 10 weeks after birth. If you have not started by the 11th week, check with your caregiver.  SEEK MEDICAL CARE IF:   There is swelling, redness, or increasing pain in the wound area.   You have pus coming from the wound.   You notice a bad smell from the wound or surgical dressing.   You have pain, redness, and swelling from the intravenous (IV) site.   Your wound breaks open (the edges are not staying together).   You feel dizzy or feel like fainting.   You develop pain or bleeding when you urinate.   You develop diarrhea.   You develop nausea and vomiting.   You develop abnormal vaginal discharge.   You develop a rash.   You have any type of abnormal reaction or develop an allergy to your medicine.   Your pain is not  relieved by your medicine or becomes worse.   Your temperature is 101 F (38.3 C), or is 100.4 F (38 C) taken 2 times in a 4 hour period.  SEEK IMMEDIATE MEDICAL CARE:  You develop a temperature of 102 F (38.9 C) or higher.   You develop abdominal pain.   You develop chest pain.   You develop shortness of breath.   You faint.   You develop pain, swelling, or redness of your leg.   You develop heavy vaginal bleeding with or without blood clots.  Document Released: 03/01/2002 Document Revised: 05/29/2011 Document Reviewed: 09/04/2010 [facility] Patient Information 2012 [place], [place].

## 2011-10-13 ENCOUNTER — Encounter: Payer: Self-pay | Admitting: Family Medicine

## 2011-10-13 ENCOUNTER — Ambulatory Visit (INDEPENDENT_AMBULATORY_CARE_PROVIDER_SITE_OTHER): Payer: BC Managed Care – PPO | Admitting: Family Medicine

## 2011-10-13 VITALS — BP 139/93 | HR 88 | Ht 63.0 in | Wt 230.0 lb

## 2011-10-13 DIAGNOSIS — R1031 Right lower quadrant pain: Secondary | ICD-10-CM

## 2011-10-13 DIAGNOSIS — M549 Dorsalgia, unspecified: Secondary | ICD-10-CM | POA: Insufficient documentation

## 2011-10-13 HISTORY — DX: Dorsalgia, unspecified: M54.9

## 2011-10-13 NOTE — Assessment & Plan Note (Addendum)
Pt states that after delivery was evaluated in MAU and told this was a muscular injury.  Exam today unclear- could definitely be 2/2 to previous muscular injury.  Motrin as needed for mod to severe pain. Pt to monitor symptoms closely as she returns to regular activity and work.  Pt to return if no improvement of if new or worsening of symptoms.

## 2011-10-13 NOTE — Patient Instructions (Addendum)
Back pain: Try pillow behind knees. Reserve motrin for pain that is 6/10 or greater.   Right lower quadrant muscle: Monitor this.  Use motrin for moderate to severe pain only.  Return if no improvement and we will recheck and consider physical therapy.  Return as needed.

## 2011-10-13 NOTE — Progress Notes (Addendum)
  Subjective:    Patient ID: Candice [name], female    DOB: 07/02/1985, 26 y.o.   MRN: [id number]  HPI  PP visit-   Delivery history- failure to progess, had c/s, baby did well, no problems.  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  Occurs almost every morning when you wake up.  Motrin makes it better.  Sleeping/laying flat seems to make it worse. No leg weakness. No bowel or bladder problems.  No fever.   Right lower abdomen pain: 1-2 x per day, stays until she takes a motrin- takes about 2 - 600mg  motrin per day. Movement makes the right lower quadrant pain worse.  No vaginal bleeding.   No vaginal discharge.    Adjustment to life with new infant: Infant doing well, on soy based milk 1/2 time, also breast feeding 1/2 time,  Infant gaining weight. Spitting up some.  Doing ok with the sleep deprivation.   Breast feeding: Breastfeeding overnight. Bottle feeding in daytime. No nipple soreness or cracks.  Incision: Seems to be healing well, no drainage, no pain at incision site.   Contraception: Tubal ligation performed at time of C/S.   Postpartum depression screen: + pmh of depression.  PHQ-9 score of 13.  Daily has little interest or pleasure in doing things, problems with sleep, and decreased energy.  More than 1/2 of the days- has problems with appetite.  Several days- has feelings of depression/hopelessness and feeling bad about herself.   Smoking status reviewed.   Review of Systems As per above.     Objective:   Physical Exam  Constitutional: She is oriented to person, place, and time. She appears well-developed and well-nourished. No distress.  HENT:  Head: Normocephalic and atraumatic.  Eyes: Pupils are equal, round, and reactive to light.  Neck: Normal range of motion.  Cardiovascular: Normal rate, regular rhythm and normal heart sounds.   No murmur heard. Pulmonary/Chest: Effort normal. No respiratory distress. She has no  wheezes. She has no rales.  Abdominal: Soft. She exhibits no distension and no mass. There is tenderness (mild tenderness on palpation of right lower quadrant). There is no rebound and no guarding.       Lower abdominal c/s incision healing well. No drainage. No redness.  No pain on palpation.   Pain in right lower quadrant worse with extension of back. No pain with other rom of torso.   Musculoskeletal: She exhibits no edema.  Neurological: She is alert and oriented to person, place, and time.  Skin: No rash noted.  Psychiatric: She has a normal mood and affect.          Assessment & Plan:

## 2011-10-13 NOTE — Assessment & Plan Note (Addendum)
No concerns at this time.-- see above hpi.  positive screen for postpartum depression- but pt to monitor closely in the setting of past history of depression. Pt refuses medications at this time (hasn't done well on antidepressants in past she said)  But is interested in therapy-  Will call Dr. Pascal Lux to make therapy appointment.

## 2011-10-13 NOTE — Assessment & Plan Note (Addendum)
Pt to improve nighttime position, place pillow behind knees,  Reserve motrin for severe pain (now taking bid), if no improvement can consider physical therapy.  Pt to Return if no improvement or if new or worsening of symptoms.

## 2011-10-23 ENCOUNTER — Telehealth: Payer: Self-pay | Admitting: Family Medicine

## 2011-10-23 NOTE — Telephone Encounter (Signed)
Needs a note to go back to work and she has been at work this week, but they are telling her that she needs a release.

## 2011-10-23 NOTE — Telephone Encounter (Signed)
Letter written and routed to red team who will find out if pt would like to come pick it up or have [place] fax it.  Team will also ask how abdominal pain is doing, and if not improved, pt will need f/u appt with me.

## 2011-10-23 NOTE — Telephone Encounter (Signed)
Forward to PCP for release to go back to work

## 2011-10-24 NOTE — Telephone Encounter (Signed)
Called pt and left message 'We have the requested letter for you' Letter is up front for pick up. Marland KitchenLorenda Hatchet, Renato Battles

## 2011-11-05 ENCOUNTER — Ambulatory Visit (INDEPENDENT_AMBULATORY_CARE_PROVIDER_SITE_OTHER): Payer: BC Managed Care – PPO | Admitting: Family Medicine

## 2011-11-05 ENCOUNTER — Other Ambulatory Visit (HOSPITAL_COMMUNITY)
Admission: RE | Admit: 2011-11-05 | Discharge: 2011-11-05 | Disposition: A | Discharge status: Home / Self Care | Payer: BC Managed Care – PPO | Source: Ambulatory Visit | Attending: Family Medicine | Admitting: Family Medicine

## 2011-11-05 VITALS — BP 122/81 | HR 76 | Temp 98.1°F | Ht 63.0 in | Wt 224.0 lb

## 2011-11-05 DIAGNOSIS — B373 Candidiasis of vulva and vagina: Secondary | ICD-10-CM

## 2011-11-05 DIAGNOSIS — Z113 Encounter for screening for infections with a predominantly sexual mode of transmission: Secondary | ICD-10-CM | POA: Insufficient documentation

## 2011-11-05 DIAGNOSIS — R300 Dysuria: Secondary | ICD-10-CM | POA: Insufficient documentation

## 2011-11-05 DIAGNOSIS — N898 Other specified noninflammatory disorders of vagina: Secondary | ICD-10-CM

## 2011-11-05 LAB — POCT UA - MICROSCOPIC ONLY

## 2011-11-05 LAB — POCT WET PREP (WET MOUNT)

## 2011-11-05 LAB — POCT URINALYSIS DIPSTICK
Bilirubin, UA: NEGATIVE
Glucose, UA: NEGATIVE
Ketones, UA: NEGATIVE
pH, UA: 7

## 2011-11-05 MED ORDER — CEPHALEXIN 500 MG PO CAPS: 500.0000 mg | ORAL_CAPSULE | Freq: Three times a day (TID) | ORAL | Status: AC

## 2011-11-05 MED ORDER — FLUCONAZOLE 150 MG PO TABS: 150.0000 mg | ORAL_TABLET | Freq: Once | ORAL | Status: AC

## 2011-11-05 NOTE — Progress Notes (Signed)
  Subjective:    Patient ID: Candice [name], female    DOB: April 30, 1986, 26 y.o.   MRN: [id number]  HPI DYSURIA Onset:  3-4 days  Description: dysuria, increased urinary frequency  Modifying factors: concominant vaginal irritation, hx/o genital herpes though no recent outbreaks   Symptoms Urgency:  yes Frequency: yes  Hesitancy:  no Hematuria: mild s/p menstrual cycle  Flank Pain: no  Fever: no Nausea/Vomiting:  no Missed LMP: no STD exposure:no  Discharge: yes Irritants: no Rash: no  Red Flags   More than 3 UTI's last 12 months:  no PMH of  Diabetes or Immunosuppression:  no Renal Disease/Calculi: no Urinary Tract Abnormality:  no Instrumentation or Trauma: no  Review of Systems See HPI, otherwise ROS negative     Objective:   Physical Exam Gen: up in chair, NAD HEENT: NCAT, EOMI CV: RRR, no murmurs auscultated PULM: good overall air movement, no wheezes ABD: S/NT/+ bowel sounds  GU: normal external genitalia, mild vaginal discharge, no CMT  EXT: 2+ peripheral pulses     Assessment & Plan:

## 2011-11-05 NOTE — Assessment & Plan Note (Addendum)
Will clinically treat for UTI w/ keflex x 7 days.  Will culture urine.

## 2011-11-05 NOTE — Progress Notes (Signed)
Addended by: [place], Tilak Oakley on: 11/05/2011 10:08 AM   Modules accepted: Orders

## 2011-11-05 NOTE — Patient Instructions (Signed)
Urinary Tract Infection °Infections of the urinary tract can start in several places. A bladder infection (cystitis), a kidney infection (pyelonephritis), and a prostate infection (prostatitis) are different types of urinary tract infections (UTIs). They usually get better if treated with medicines (antibiotics) that kill germs. Take all the medicine until it is gone. You or your child may feel better in a few days, but TAKE ALL MEDICINE or the infection may not respond and may become more difficult to treat. °HOME CARE INSTRUCTIONS  °· Drink enough water and fluids to keep the urine clear or pale yellow. Cranberry juice is especially recommended, in addition to large amounts of water.  °· Avoid caffeine, tea, and carbonated beverages. They tend to irritate the bladder.  °· Alcohol may irritate the prostate.  °· Only take over-the-counter or prescription medicines for pain, discomfort, or fever as directed by your caregiver.  °To prevent further infections: °· Empty the bladder often. Avoid holding urine for long periods of time.  °· After a bowel movement, women should cleanse from front to back. Use each tissue only once.  °· Empty the bladder before and after sexual intercourse.  °FINDING OUT THE RESULTS OF YOUR TEST °Not all test results are available during your visit. If your or your child's test results are not back during the visit, make an appointment with your caregiver to find out the results. Do not assume everything is normal if you have not heard from your caregiver or the medical facility. It is important for you to follow up on all test results. °SEEK MEDICAL CARE IF:  °· There is back pain.  °· Your baby is older than 3 months with a rectal temperature of 100.5° F (38.1° C) or higher for more than 1 day.  °· Your or your child's problems (symptoms) are no better in 3 days. Return sooner if you or your child is getting worse.  °SEEK IMMEDIATE MEDICAL CARE IF:  °· There is severe back pain or lower  abdominal pain.  °· You or your child develops chills.  °· You have a fever.  °· Your baby is older than 3 months with a rectal temperature of 102° F (38.9° C) or higher.  °· Your baby is 3 months old or younger with a rectal temperature of 100.4° F (38° C) or higher.  °· There is nausea or vomiting.  °· There is continued burning or discomfort with urination.  °MAKE SURE YOU:  °· Understand these instructions.  °· Will watch your condition.  °· Will get help right away if you are not doing well or get worse.  °Document Released: 03/19/2005 Document Revised: 05/29/2011 Document Reviewed: 10/22/2006 °Exit[employer]® Patient Information ©2012 [employer].Candida Infection, Adult °A candida infection (also called yeast, fungus and Monilia infection) is an overgrowth of yeast that can occur anywhere on the body. A yeast infection commonly occurs in warm, moist body areas. Usually, the infection remains localized but can spread to become a systemic infection. A yeast infection may be a sign of a more severe disease such as diabetes, leukemia, or AIDS. °A yeast infection can occur in both men and women. In women, Candida vaginitis is a vaginal infection. It is one of the most common causes of vaginitis. Men usually do not have symptoms or know they have an infection until other problems develop. Men may find out they have a yeast infection because their sex partner has a yeast infection. Uncircumcised men are more likely to get a yeast infection   than circumcised men. This is because the uncircumcised glans is not exposed to air and does not remain as dry as that of a circumcised glans. Older adults may develop yeast infections around dentures. °CAUSES  °Women °· Antibiotics.  °· Steroid medication taken for a long time.  °· Being overweight (obese).  °· Diabetes.  °· Poor immune condition.  °· Certain serious medical conditions.  °· Immune suppressive medications for organ transplant patients.  °· Chemotherapy.  °· Pregnancy.   °· Menstration.  °· Stress and fatigue.  °· Intravenous drug use.  °· Oral contraceptives.  °· Wearing tight-fitting clothes in the crotch area.  °· Catching it from a sex partner who has a yeast infection.  °· Spermicide.  °· Intravenous, urinary, or other catheters.  °Men °· Catching it from a sex partner who has a yeast infection.  °· Having oral or anal sex with a person who has the infection.  °· Spermicide.  °· Diabetes.  °· Antibiotics.  °· Poor immune system.  °· Medications that suppress the immune system.  °· Intravenous drug use.  °· Intravenous, urinary, or other catheters.  °SYMPTOMS  °Women °· Thick, white vaginal discharge.  °· Vaginal itching.  °· Redness and swelling in and around the vagina.  °· Irritation of the lips of the vagina and perineum.  °· Blisters on the vaginal lips and perineum.  °· Painful sexual intercourse.  °· Low blood sugar (hypoglycemia).  °· Painful urination.  °· Bladder infections.  °· Intestinal problems such as constipation, indigestion, bad breath, bloating, increase in gas, diarrhea, or loose stools.  °Men °· Men may develop intestinal problems such as constipation, indigestion, bad breath, bloating, increase in gas, diarrhea, or loose stools.  °· Dry, cracked skin on the penis with itching or discomfort.  °· Jock itch.  °· Dry, flaky skin.  °· Athlete's foot.  °· Hypoglycemia.  °DIAGNOSIS  °Women °· A history and an exam are performed.  °· The discharge may be examined under a microscope.  °· A culture may be taken of the discharge.  °Men °· A history and an exam are performed.  °· Any discharge from the penis or areas of cracked skin will be looked at under the microscope and cultured.  °· Stool samples may be cultured.  °TREATMENT  °Women °· Vaginal antifungal suppositories and creams.  °· Medicated creams to decrease irritation and itching on the outside of the vagina.  °· Warm compresses to the perineal area to decrease swelling and discomfort.  °· Oral antifungal  medications.  °· Medicated vaginal suppositories or cream for repeated or recurrent infections.  °· Wash and dry the irritation areas before applying the cream.  °· Eating yogurt with lactobacillus may help with prevention and treatment.  °· Sometimes painting the vagina with gentian violet solution may help if creams and suppositories do not work.  °Men °· Antifungal creams and oral antifungal medications.  °· Sometimes treatment must continue for 30 days after the symptoms go away to prevent recurrence.  °HOME CARE INSTRUCTIONS  °Women °· Use cotton underwear and avoid tight-fitting clothing.  °· Avoid colored, scented toilet paper and deodorant tampons or pads.  °· Do not douche.  °· Keep your diabetes under control.  °· Finish all the prescribed medications.  °· Keep your skin clean and dry.  °· Consume milk or yogurt with lactobacillus active culture regularly. If you get frequent yeast infections and think that is what the infection is, there are over-the-counter   medications that you can get. If the infection does not show healing in 3 days, talk to your caregiver.  °· Tell your sex partner you have a yeast infection. Your partner may need treatment also, especially if your infection does not clear up or recurs.  °Men °· Keep your skin clean and dry.  °· Keep your diabetes under control.  °· Finish all prescribed medications.  °· Tell your sex partner that you have a yeast infection so they can be treated if necessary.  °SEEK MEDICAL CARE IF:  °· Your symptoms do not clear up or worsen in one week after treatment.  °· You have an oral temperature above 102° F (38.9° C).  °· You have trouble swallowing or eating for a prolonged time.  °· You develop blisters on and around your vagina.  °· You develop vaginal bleeding and it is not your menstrual period.  °· You develop abdominal pain.  °· You develop intestinal problems as mentioned above.  °· You get weak or lightheaded.  °· You have painful or increased  urination.  °· You have pain during sexual intercourse.  °MAKE SURE YOU:  °· Understand these instructions.  °· Will watch your condition.  °· Will get help right away if you are not doing well or get worse.  °Document Released: 07/17/2004 Document Revised: 05/29/2011 Document Reviewed: 10/29/2009 °ExitCare® Patient Information ©2012 [employer]. °

## 2011-11-05 NOTE — Assessment & Plan Note (Signed)
Fluconazole 150 po x1.

## 2011-11-09 ENCOUNTER — Encounter: Payer: Self-pay | Admitting: Family Medicine

## 2012-02-13 ENCOUNTER — Other Ambulatory Visit: Payer: Self-pay | Admitting: Family Medicine

## 2012-02-13 MED ORDER — FLUCONAZOLE 150 MG PO TABS: 150.0000 mg | ORAL_TABLET | Freq: Once | ORAL | Status: AC

## 2012-02-13 NOTE — Telephone Encounter (Signed)
Pt seen during her child's 4 month [facility].  C/o cauliflower like vaginal d/c and itching.  Ha shad many yeast infections previously and knows this is one.  Would like to know if I can Rx her something-- agreed to send diflucan po in to pharmacy.

## 2012-04-18 ENCOUNTER — Encounter (HOSPITAL_COMMUNITY): Payer: Self-pay | Admitting: *Deleted

## 2012-04-18 ENCOUNTER — Emergency Department (HOSPITAL_COMMUNITY)
Admission: EM | Admit: 2012-04-18 | Discharge: 2012-04-19 | Disposition: A | Discharge status: Home / Self Care | Payer: BC Managed Care – PPO | Attending: Emergency Medicine | Admitting: Emergency Medicine

## 2012-04-18 DIAGNOSIS — J029 Acute pharyngitis, unspecified: Secondary | ICD-10-CM | POA: Insufficient documentation

## 2012-04-18 DIAGNOSIS — Z8659 Personal history of other mental and behavioral disorders: Secondary | ICD-10-CM | POA: Insufficient documentation

## 2012-04-18 DIAGNOSIS — R51 Headache: Secondary | ICD-10-CM

## 2012-04-18 DIAGNOSIS — Z8619 Personal history of other infectious and parasitic diseases: Secondary | ICD-10-CM | POA: Insufficient documentation

## 2012-04-18 LAB — RAPID STREP SCREEN (MED CTR MEBANE ONLY): Streptococcus, Group A Screen (Direct): NEGATIVE

## 2012-04-18 MED ORDER — MAGNESIUM SULFATE 40 MG/ML IJ SOLN
2.0000 g | Freq: Once | INTRAMUSCULAR | Status: AC
  Administered 2012-04-18: 2 g via INTRAVENOUS
  Filled 2012-04-18: qty 50

## 2012-04-18 MED ORDER — SODIUM CHLORIDE 0.9 % IV BOLUS (SEPSIS)
1000.0000 mL | Freq: Once | INTRAVENOUS | Status: AC
  Administered 2012-04-18: 1000 mL via INTRAVENOUS

## 2012-04-18 MED ORDER — ACETAMINOPHEN 325 MG PO TABS
650.0000 mg | ORAL_TABLET | Freq: Once | ORAL | Status: AC
  Administered 2012-04-18: 650 mg via ORAL
  Filled 2012-04-18: qty 2

## 2012-04-18 NOTE — ED Notes (Signed)
Pt reports HA that started last night.  Pt has had a cold x several days.  Pt reports head is tender and throbbing.  Pt tearful in triage.  Denies N/V.  PERRLA.

## 2012-04-18 NOTE — ED Provider Notes (Signed)
History     CSN: [id number]  Arrival date & time 04/18/12  2019   First MD Initiated Contact with Patient 04/18/12 2252      Chief Complaint  Patient presents with  . Headache    (Consider location/radiation/quality/duration/timing/severity/associated sxs/prior treatment) HPI HX per PT. Sore throat and HA, sharp and located L side, no photophobia, no neck pain or stiffness. No rash, everybody at home is sick, no cough, no F/C. No n/v/d. Mod in severity Past Medical History  Diagnosis Date  . No pertinent past medical history   . Depression   . HSV 06/28/2007  . Complication of anesthesia     epidural with last pregnancy made entire left side numb, had to d/c    Past Surgical History  Procedure Date  . Cesarean section   . Iud removal 2005    in cervix  . Cesarean section 09/07/2011    Procedure: CESAREAN SECTION;  Surgeon: Lazaro Arms, MD;  Location: [facility];  Service: Gynecology;  Laterality: N/A;  Repeat cesarean section with delivery of baby boy at 107. Apgars 9/9.  Bilateral tubal ligation.    Family History  Problem Relation Age of Onset  . Alcohol abuse Mother   . Drug abuse Mother   . Alcohol abuse Father   . Mental illness Sister     bipolar ptsd  . Diabetes Maternal Grandmother   . Hypertension Maternal Grandmother   . Cancer Maternal Grandmother     ovarian, breast and lung    History  Substance Use Topics  . Smoking status: Current Every Day Smoker -- 0.3 packs/day    Types: Cigarettes  . Smokeless tobacco: Never Used  . Alcohol Use: No    OB History    Grav Para Term Preterm Abortions TAB SAB Ect Mult Living   2 2 2  0 0 0 0 0 0 2      Review of Systems  Constitutional: Negative for fever and chills.  HENT: Positive for sore throat. Negative for drooling, mouth sores, trouble swallowing, neck pain, neck stiffness and voice change.   Eyes: Negative for pain.  Respiratory: Negative for shortness of breath.   Cardiovascular: Negative for  chest pain.  Gastrointestinal: Negative for abdominal pain.  Genitourinary: Negative for dysuria.  Musculoskeletal: Negative for back pain.  Skin: Negative for rash.  Neurological: Positive for headaches.  All other systems reviewed and are negative.    Allergies  Review of patient's allergies indicates no known allergies.  Home Medications   Current Outpatient Rx  Name Route Sig Dispense Refill  . ASPIRIN 325 MG PO TABS Oral Take 325 mg by mouth every 6 (six) hours as needed. For cold    . ASPIRIN EFFERVESCENT 325 MG PO TBEF Oral Take 325 mg by mouth every 6 (six) hours as needed. For cold symptoms    . GUAIFENESIN 100 MG/5ML PO SOLN Oral Take 5 mLs by mouth every 4 (four) hours as needed. For cold      BP 120/66  Pulse 72  Temp 98.2 F (36.8 C) (Oral)  Resp 18  SpO2 98%  Physical Exam  Constitutional: She is oriented to person, place, and time. She appears well-developed and well-nourished.  HENT:  Head: Normocephalic and atraumatic.       Nasal congestion. Bilateral tonsillar swelling with exudates, uvula midline.   Eyes: EOM are normal. Pupils are equal, round, and reactive to light.  Neck: Neck supple. No tracheal deviation present.  Cardiovascular: Normal rate, regular  rhythm and intact distal pulses.   Pulmonary/Chest: Effort normal and breath sounds normal. No stridor. No respiratory distress.  Abdominal: Soft. Bowel sounds are normal. She exhibits no distension. There is no tenderness. There is no rebound and no guarding.  Musculoskeletal: Normal range of motion. She exhibits no edema.  Neurological: She is alert and oriented to person, place, and time. She has normal reflexes. She displays normal reflexes. No cranial nerve deficit. She exhibits normal muscle tone. Coordination normal.  Skin: Skin is warm and dry. No rash noted.    ED Course  Procedures (including critical care time)  Results for orders placed during the hospital encounter of 04/18/12  RAPID  STREP SCREEN      Component Value Range   Streptococcus, Group A Screen (Direct) NEGATIVE  NEGATIVE   IVFs and HA cocktail/ tylenol.   12:29 AM recheck - pain improving, no change normal neuro exam. Plan d/c home RX motrin   MDM   Pharyngitis with HA. No fever or meningismus, multiple sick contacts at home. Strep test neg, likely viral. Infection precautions verbalized as understood, stable for discharge home - PT agrees to get a flu shot and return here for any worsening condition. VS and nursing notes reviewed.         Sunnie Nielsen, MD 04/19/12 [phone]

## 2012-04-18 NOTE — ED Notes (Signed)
When entering pt room, pt was tearful and holding left side of head. Pt states the pain started yesterday and got worse overnight. Pt describes pain as a stabbing pain that goes across forehead, pt also states that pain is somewhat relieved with pressure

## 2012-04-19 MED ORDER — SODIUM CHLORIDE 0.9 % IV BOLUS (SEPSIS): 1000.0000 mL | Freq: Once | INTRAVENOUS | Status: DC

## 2012-04-19 MED ORDER — DIPHENHYDRAMINE HCL 50 MG/ML IJ SOLN
25.0000 mg | Freq: Once | INTRAMUSCULAR | Status: AC
  Administered 2012-04-19: 25 mg via INTRAVENOUS
  Filled 2012-04-19: qty 1

## 2012-04-19 MED ORDER — DEXAMETHASONE SODIUM PHOSPHATE 4 MG/ML IJ SOLN
10.0000 mg | Freq: Once | INTRAMUSCULAR | Status: AC
  Administered 2012-04-19: 10 mg via INTRAVENOUS
  Filled 2012-04-19: qty 3

## 2012-04-19 MED ORDER — METOCLOPRAMIDE HCL 5 MG/ML IJ SOLN
10.0000 mg | Freq: Once | INTRAMUSCULAR | Status: AC
  Administered 2012-04-19: 10 mg via INTRAVENOUS
  Filled 2012-04-19: qty 2

## 2012-04-19 MED ORDER — ACETAMINOPHEN-CODEINE 120-12 MG/5ML PO SUSP: 5.0000 mL | Freq: Four times a day (QID) | ORAL | Status: DC | PRN

## 2012-04-19 MED ORDER — IBUPROFEN 800 MG PO TABS: 800.0000 mg | ORAL_TABLET | Freq: Three times a day (TID) | ORAL | Status: DC

## 2012-04-28 ENCOUNTER — Encounter: Payer: Self-pay | Admitting: Emergency Medicine

## 2012-04-28 ENCOUNTER — Ambulatory Visit (INDEPENDENT_AMBULATORY_CARE_PROVIDER_SITE_OTHER): Payer: BC Managed Care – PPO | Admitting: Emergency Medicine

## 2012-04-28 VITALS — BP 123/83 | HR 80 | Temp 98.3°F | Ht 63.0 in | Wt 216.0 lb

## 2012-04-28 DIAGNOSIS — B349 Viral infection, unspecified: Secondary | ICD-10-CM

## 2012-04-28 DIAGNOSIS — F17200 Nicotine dependence, unspecified, uncomplicated: Secondary | ICD-10-CM

## 2012-04-28 DIAGNOSIS — Z23 Encounter for immunization: Secondary | ICD-10-CM

## 2012-04-28 DIAGNOSIS — B9789 Other viral agents as the cause of diseases classified elsewhere: Secondary | ICD-10-CM

## 2012-04-28 MED ORDER — ERYTHROMYCIN 5 MG/GM OP OINT: TOPICAL_OINTMENT | Freq: Every day | OPHTHALMIC | Status: DC

## 2012-04-28 NOTE — Patient Instructions (Addendum)
It was nice to see you. I think you have viral infection - likely adenovirus. I have sent a prescription for an antibiotic ointment for your eyes.  Use it daily at bedtime for the next week.   You can use Afrin nasal spray twice a day for the next 3 days to help with congestion in the nose and ears. You should start to feel better in the next week or so. If you are not getting better, please come back in for a recheck.

## 2012-04-28 NOTE — Progress Notes (Signed)
  Subjective:    Patient ID: Candice [name], female    DOB: August 09, 1985, 26 y.o.   MRN: [id number]  HPI Candice [name] is here for a SDA for ?pink eye.  She reports being sick since last week with cough, sore throat, small amount of diarrhea.  No fevers, nausea or vomiting.  Tolerating PO well.  Last day or so has noticed that her eyes are red (L>R), gets some blurry vision, and they feel gritty.  Denies eye pain or loss of vision.  SHx: current smoker 4-5 cig/day   Review of Systems See HPI    Objective:   Physical Exam BP 123/83  Pulse 80  Temp 98.3 F (36.8 C) (Oral)  Ht 5\' 3"  (1.6 m)  Wt 216 lb (97.977 kg)  BMI 38.26 kg/m2 Gen: alert, cooperative, NAD HEENT: AT/[place], sclera injected (L>R), no drainage from eyes, PERRL, EOMI without pain, MMM, no pharyngeal erythema or exudate, bilateral middle ear effusions without erythema or pus Neck: supple, no LAD CV: RRR, no murmurs Pulm: CTAB, no wheezes or rales      Assessment & Plan:

## 2012-04-28 NOTE — Assessment & Plan Note (Signed)
Discussed quitting if possible to help with resolution of current illness and prevent future illness.

## 2012-04-28 NOTE — Assessment & Plan Note (Addendum)
Likely adenovirus given constellation of URI, diarrhea, and conjunctivitis.  Discussed expected course and improvement in 1 week.  Discussed using Afrin for 3 days to help with congestion.  As she works in [employer] and eyes feel gritty, will given erythromycin eye ointment to use at bedtime.  Discussed red flags for return.

## 2012-04-29 ENCOUNTER — Ambulatory Visit: Payer: BC Managed Care – PPO

## 2012-05-18 ENCOUNTER — Telehealth: Payer: Self-pay | Admitting: Family Medicine

## 2012-05-18 NOTE — Telephone Encounter (Signed)
Pt is having an outbreak and would like a refill on her valtrex - pls advise  [facility] [address]

## 2012-05-18 NOTE — Telephone Encounter (Signed)
Will forward message to Dr. McGill 

## 2012-05-20 MED ORDER — VALACYCLOVIR HCL 500 MG PO TABS: 500.0000 mg | ORAL_TABLET | Freq: Two times a day (BID) | ORAL | Status: DC

## 2012-05-20 NOTE — Telephone Encounter (Signed)
Sent in Valtrex with refills

## 2012-05-25 IMAGING — US US OB FOLLOW-UP
1 series · 12 of 28 positions shown · non-contrast
Comparison: none

[Series 1: us ob follow up · 12 of 36 slices shown]
[im 2/36]
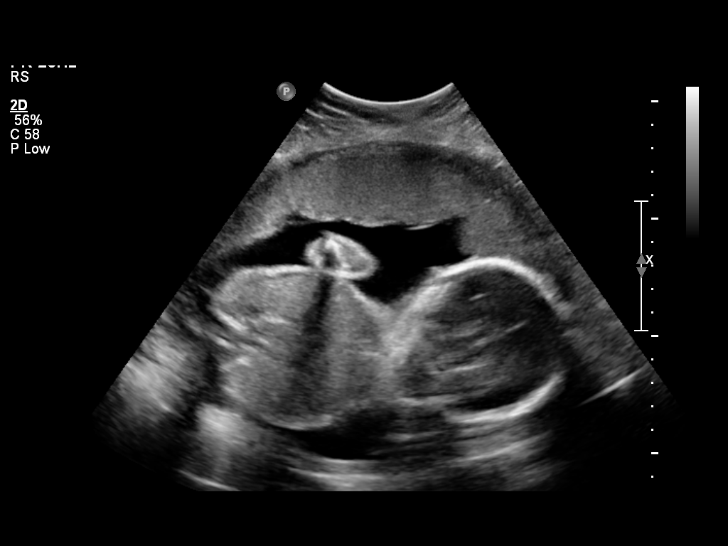
[im 4/36]
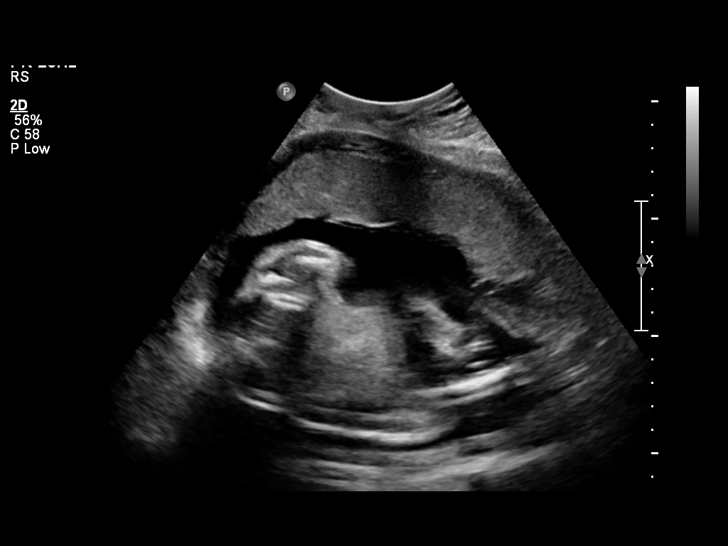
[im 7/36]
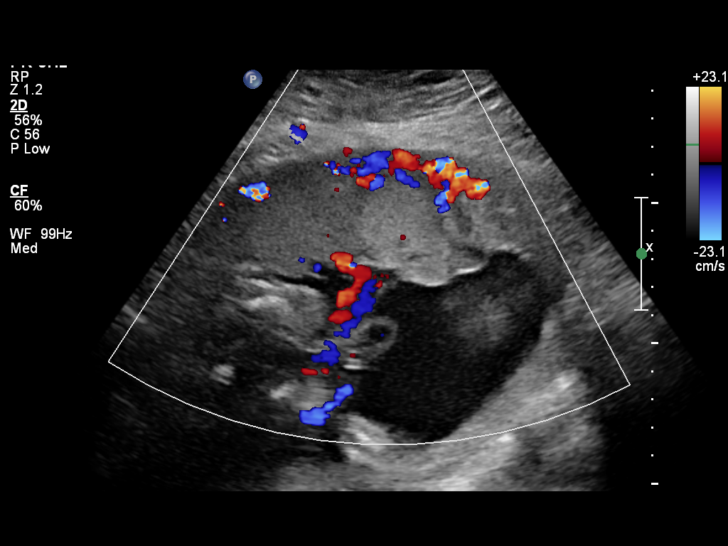
[im 11/36]
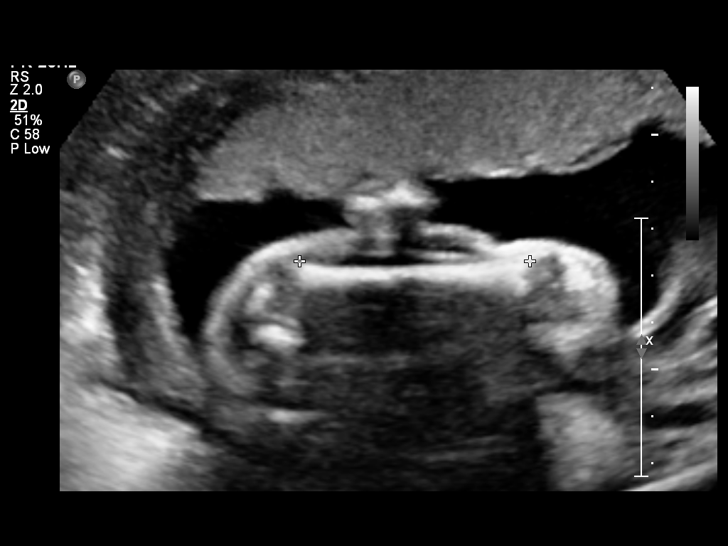
[im 13/36]
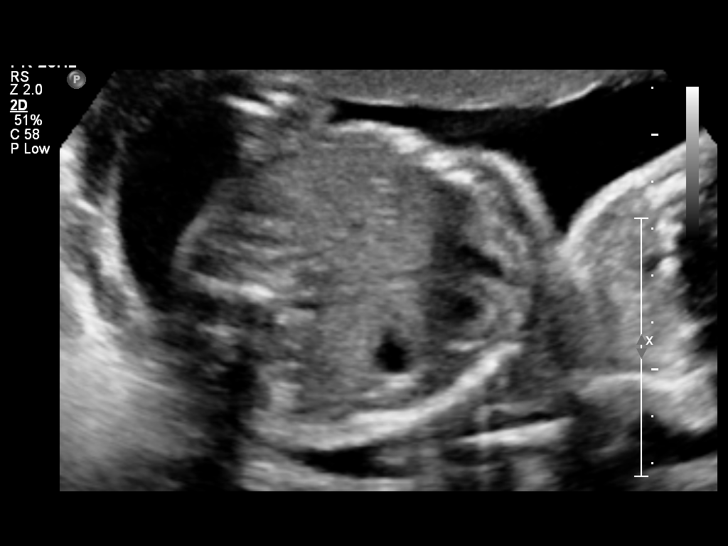
[im 16/36]
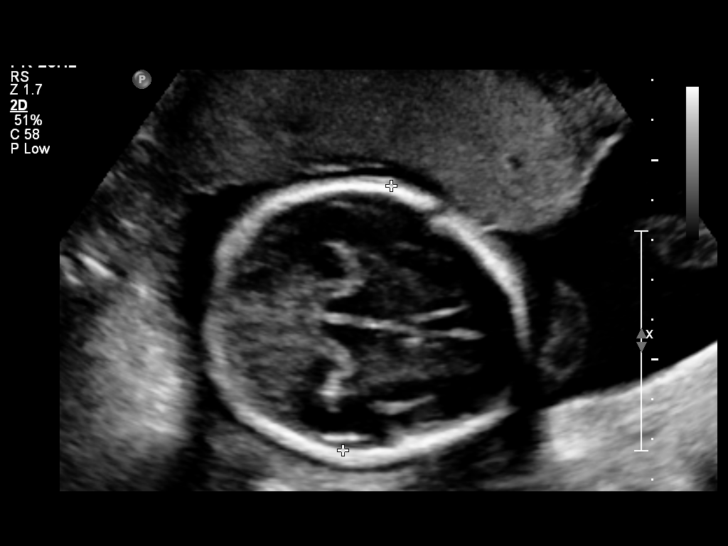
[im 20/36]
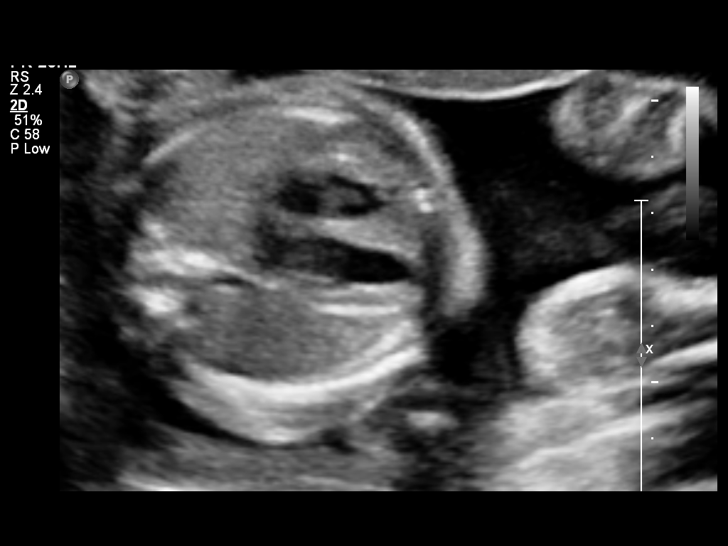
[im 23/36]
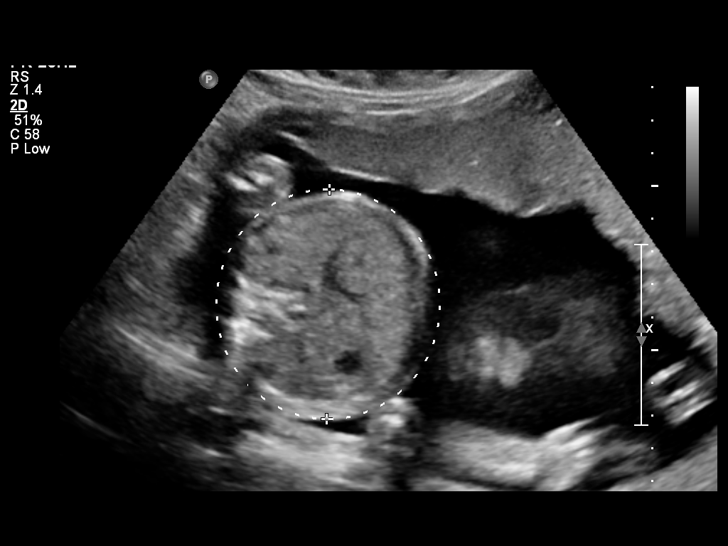
[im 25/36]
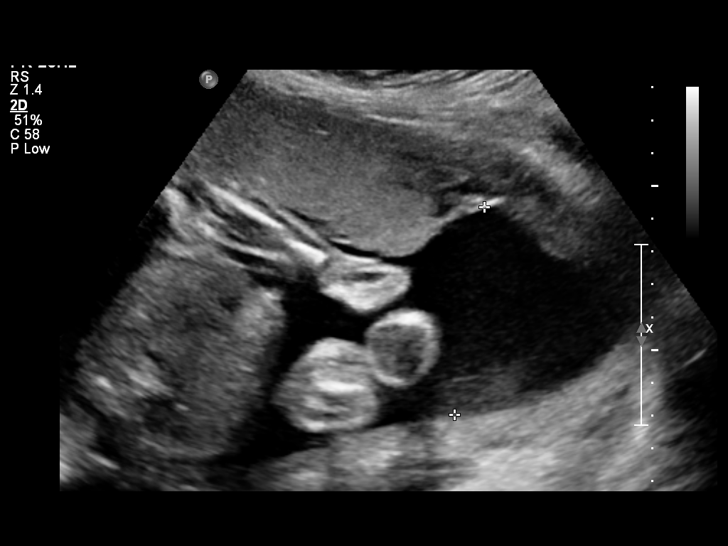
[im 29/36]
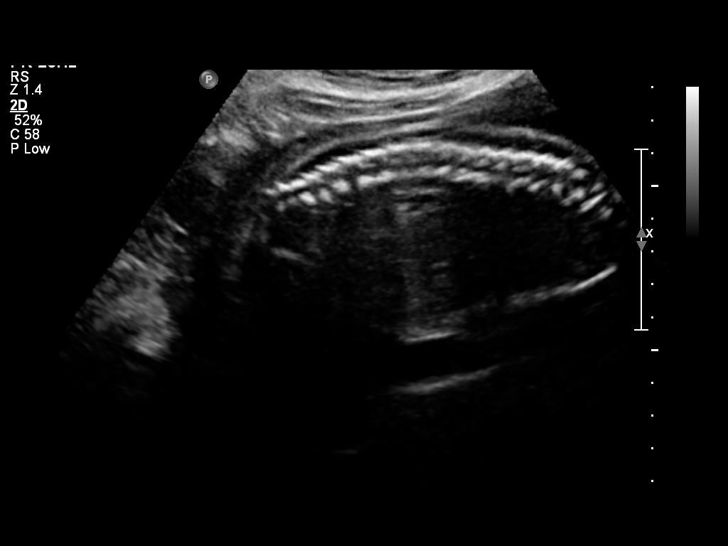
[im 32/36]
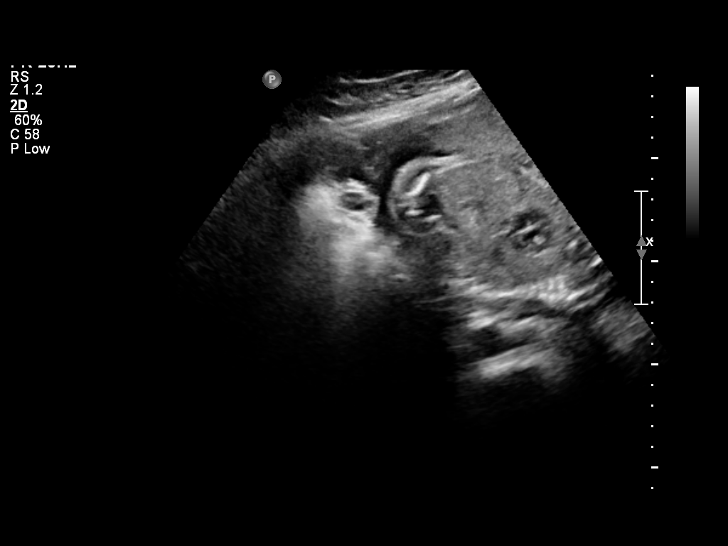
[im 34/36]
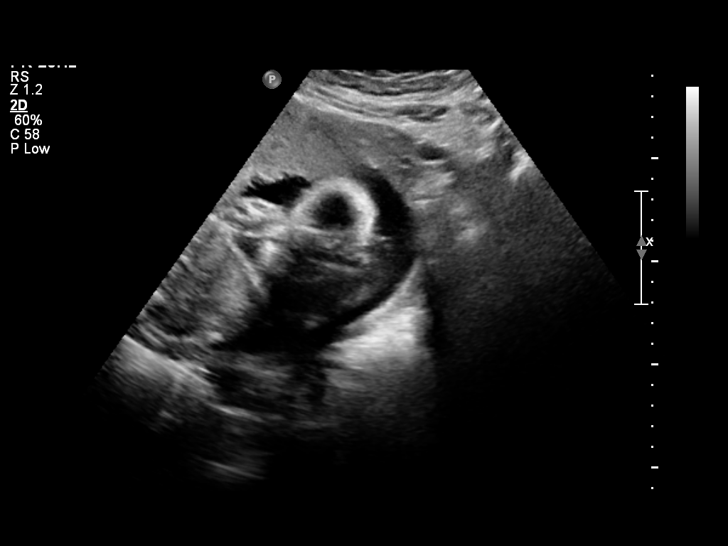

[12 of 28 positions shown; findings below may reference images not displayed]

OBSTETRICS REPORT
                      (Signed Final 06/03/2011 [DATE])

 Order#:         [id number]
Procedures

 US OB FOLLOW UP                                       76816.1
Indications

 Follow-up fetal anatomic evaluation
 Marginal Cord insertion
 Assess Fetal Growth / Estimated Fetal Weight
Fetal Evaluation

 Fetal Heart Rate:  153                         bpm
 Cardiac Activity:  Observed
 Presentation:      Transverse, head to
                    maternal left
 Placenta:          Anterior, above cervical os
 P. Cord            Marginal insertion
 Insertion:

 Amniotic Fluid
 AFI FV:      Subjectively within normal limits
                                             Larg Pckt:     6.4  cm
Biometry

 BPD:     67.2  mm    G. Age:   27w 1d                CI:        77.46   70 - 86
                                                      FL/HC:      20.6   18.6 -

 HC:     241.7  mm    G. Age:   26w 2d        3  %    HC/AC:      1.12   1.05 -

 AC:     215.4  mm    G. Age:   26w 0d        9  %    FL/BPD:     74.1   71 - 87
 FL:      49.8  mm    G. Age:   26w 6d       20  %    FL/AC:      23.1   20 - 24

 Est. FW:     930  gm      2 lb 1 oz     30  %
Gestational Age

 LMP:           27w 3d       Date:   11/23/10                 EDD:   08/30/11
 U/S Today:     26w 4d                                        EDD:   09/05/11
 Best:          27w 3d    Det. By:   LMP  (11/23/10)          EDD:   08/30/11
Anatomy
 Cranium:           Appears normal      Aortic Arch:       Previously seen
 Fetal Cavum:       Appears normal      Ductal Arch:       Previously seen
 Ventricles:        Appears normal      Diaphragm:         Appears normal
 Choroid Plexus:    Previously seen     Stomach:           Appears
                                                           normal, left
                                                           sided
 Cerebellum:        Previously seen     Abdomen:           Appears normal
 Posterior Fossa:   Previously seen     Abdominal Wall:    Previously seen
 Nuchal Fold:       Previously seen     Cord Vessels:      Previously seen
 Face:              Lips and orbits     Kidneys:           Appear normal
                    previously seen
 Heart:             Appears normal      Bladder:           Appears normal
                    (4 chamber &
                    axis)
 RVOT:              Appears normal      Spine:             Limited views
                                                           appear normal
 LVOT:              Previously seen     Limbs:             Previously seen

 Other:     Male gender. Heels and 5th digit previously seen.
            Technically difficult due to maternal habitus and fetal
            position.
Cervix Uterus Adnexa

 Cervical Length:   4.6       cm

 Cervix:       Closed. Normal appearance by transabdominal scan.

 Left Ovary:   Previously seen.
 Right Ovary:  Previously seen
 Adnexa:     No abnormality visualized.
Impression

 There has been concordant interval growth.  The EFW is at
 the 30th percentile.  The amniotic fluid volume is normal.

 Marginal insertion of the umbilical cord persists, 1.8 cm from
 edge.

## 2012-07-02 ENCOUNTER — Encounter: Payer: Self-pay | Admitting: Family Medicine

## 2012-07-02 ENCOUNTER — Ambulatory Visit (INDEPENDENT_AMBULATORY_CARE_PROVIDER_SITE_OTHER): Payer: BC Managed Care – PPO | Admitting: Family Medicine

## 2012-07-02 VITALS — BP 121/81 | HR 78 | Temp 98.6°F | Ht 61.0 in | Wt 221.7 lb

## 2012-07-02 DIAGNOSIS — A499 Bacterial infection, unspecified: Secondary | ICD-10-CM

## 2012-07-02 DIAGNOSIS — N760 Acute vaginitis: Secondary | ICD-10-CM

## 2012-07-02 DIAGNOSIS — N898 Other specified noninflammatory disorders of vagina: Secondary | ICD-10-CM

## 2012-07-02 DIAGNOSIS — B9689 Other specified bacterial agents as the cause of diseases classified elsewhere: Secondary | ICD-10-CM

## 2012-07-02 LAB — POCT WET PREP (WET MOUNT)

## 2012-07-02 MED ORDER — METRONIDAZOLE 500 MG PO TABS: 500.0000 mg | ORAL_TABLET | Freq: Two times a day (BID) | ORAL | Status: AC

## 2012-07-02 NOTE — Progress Notes (Signed)
S: Pt comes in today for SDA for vaginal discharge.  Has HSV outbreak 2 weeks ago and started having discharge right after this, which is typical for her.  D/c is thin and has an odor. + itching. No pelvic discomfort. No fevers/chills. No concern for STIs.    ROS: Per HPI  History  Smoking status  . Current Every Day Smoker -- 0.3 packs/day  . Types: Cigarettes  Smokeless tobacco  . Never Used    O:  Filed Vitals:   07/02/12 1650  BP: 121/81  Pulse: 78  Temp: 98.6 F (37 C)    Gen: NAD Pelvic: deferred- did self wet prep   A/P: 27 y.o. female p/w BV -See problem list -f/u in PRN

## 2012-07-02 NOTE — Assessment & Plan Note (Signed)
+  BV on wet prep. Treat with 7 days of flagyl.

## 2012-10-15 ENCOUNTER — Ambulatory Visit (INDEPENDENT_AMBULATORY_CARE_PROVIDER_SITE_OTHER): Payer: BC Managed Care – PPO | Admitting: Family Medicine

## 2012-10-15 ENCOUNTER — Encounter: Payer: Self-pay | Admitting: Family Medicine

## 2012-10-15 VITALS — BP 124/80 | HR 80 | Ht 61.0 in | Wt 237.0 lb

## 2012-10-15 DIAGNOSIS — Z0000 Encounter for general adult medical examination without abnormal findings: Secondary | ICD-10-CM

## 2012-10-15 DIAGNOSIS — F17200 Nicotine dependence, unspecified, uncomplicated: Secondary | ICD-10-CM

## 2012-10-15 DIAGNOSIS — R5383 Other fatigue: Secondary | ICD-10-CM

## 2012-10-15 DIAGNOSIS — M549 Dorsalgia, unspecified: Secondary | ICD-10-CM

## 2012-10-15 DIAGNOSIS — R5381 Other malaise: Secondary | ICD-10-CM

## 2012-10-15 DIAGNOSIS — Z01419 Encounter for gynecological examination (general) (routine) without abnormal findings: Secondary | ICD-10-CM | POA: Insufficient documentation

## 2012-10-15 DIAGNOSIS — E669 Obesity, unspecified: Secondary | ICD-10-CM

## 2012-10-15 LAB — COMPREHENSIVE METABOLIC PANEL
ALT: 13 U/L (ref 0–35)
AST: 13 U/L (ref 0–37)
Albumin: 3.7 g/dL (ref 3.5–5.2)
CO2: 30 mEq/L (ref 19–32)
Calcium: 9.5 mg/dL (ref 8.4–10.5)
Chloride: 103 mEq/L (ref 96–112)
Creat: 0.74 mg/dL (ref 0.50–1.10)
Potassium: 4 mEq/L (ref 3.5–5.3)
Total Protein: 6.7 g/dL (ref 6.0–8.3)

## 2012-10-15 LAB — CBC
Hemoglobin: 12.2 g/dL (ref 12.0–15.0)
MCH: 26.6 pg (ref 26.0–34.0)
MCHC: 32.5 g/dL (ref 30.0–36.0)
Platelets: 311 10*3/uL (ref 150–400)
RDW: 14.9 % (ref 11.5–15.5)

## 2012-10-15 LAB — TSH: TSH: 2.062 u[IU]/mL (ref 0.350–4.500)

## 2012-10-15 MED ORDER — FLUCONAZOLE 150 MG PO TABS: 150.0000 mg | ORAL_TABLET | Freq: Once | ORAL | Status: DC

## 2012-10-15 MED ORDER — CYCLOBENZAPRINE HCL 10 MG PO TABS: 5.0000 mg | ORAL_TABLET | Freq: Three times a day (TID) | ORAL | Status: DC | PRN

## 2012-10-15 NOTE — Patient Instructions (Addendum)
It was good to see you today.  Ask your insurance company if they cover breast reduction surgery-- and, if they do, what criteria do they have.   You can take 800mg  of ibuprofen, up to 3 times per day, as needed for your back pain.  I am also sending in a muscle relaxer medicine-- it may make you sleepy, so take 1/2 tab first and don't plan on driving. I am also sending you to physical therapy to help.  I think that you would benefit from meeting with Dr. Gerilyn Pilgrim, our nutritionist, to discuss your weight.  You can schedule an appointment directly with her by calling [phone].  We are checking some lab work-- I will send you a letter with the results or call if anything is abnormal.  I will send in some diflucan to help with the recurrent yeast infections.  Come in one of the times you have one so that we can make sure it is still yeast.  Also come in if you feel like the medicine isn't helping any more.  Think about quitting smoking!  We have lots of resources to help when you are ready.  Come back to see me in 1-2 months so we can check in about things.

## 2012-10-15 NOTE — Assessment & Plan Note (Signed)
Pt will look into breast reduction surgery with her insurance company.  Will try flexeril + ibuprofen + PT.

## 2012-10-15 NOTE — Assessment & Plan Note (Signed)
No ready to quit, encouraged pt to consider this.  She will let us know when she is ready.

## 2012-10-15 NOTE — Assessment & Plan Note (Signed)
Up to date on everything. S/p BTL.

## 2012-10-15 NOTE — Progress Notes (Signed)
Subjective:     Candice [name] is a 27 y.o. female and is here for a comprehensive physical exam. The patient reports problems - :. Back pain: is her upper back (since she was [age] yo) but is now also in her mid and lower back (for the past year), feels like it is because of her large breasts and her weight; feels like there is muscle tightness, difficult to bend over; worse with menstrual cramps.  No numbness/tingling/weakness radiating legs; no bowel/bladder incontinence but sometimes has cramps or shocking pains in her buttock, occasionally in her upper thighs.  Has tried ibuprofen-- will help a little, helps decrease the pain some but doesn't completely take it away.  Touching toes makes it worse, having arms above head for long period of time makes it worse also.   Vaginal yeast infections: has been using OTC yeast po pill, doesn't like to use the vaginal creams because they are messy; usually has 1 break out a month- usually the week before her period  Weight gain: is very bothersome to her; exercise- does [employer] cardio- 30 min 1x/week, walks and lifts boxes at work; diet- doesn't eat breakfast or lunch and then eats everything when she gets off of work; is interested in meeting with Gerilyn Pilgrim    History   Social History  . Marital Status: Single    Spouse Name: N/A    Number of Children: N/A  . Years of Education: N/A   Occupational History  . Not on file.   Social History Main Topics  . Smoking status: Current Every Day Smoker -- 0.30 packs/day    Types: Cigarettes  . Smokeless tobacco: Never Used  . Alcohol Use: No  . Drug Use: Yes    Special: Marijuana  . Sexually Active: Not Currently    Birth Control/ Protection: Other-see comments     Comment: pregnant   Other Topics Concern  . Not on file   Social History Narrative   Works as Oncologist.   FOB of both children lives in [place]; will be some what involved but mother is no longer dating him.   Health  Maintenance  Topic Date Due  . Tetanus/tdap  11/21/2012  . Influenza Vaccine  02/21/2013  . Pap Smear  10/09/2013    The following portions of the patient's history were reviewed and updated as appropriate: allergies, current medications, past family history, past medical history, past social history, past surgical history and problem list.  Review of Systems Pertinent items are noted in HPI.  Additionally, + for frequent urination and muscle cramps/aches  Otherwise, 12 point ROS negative.   Objective:    BP 124/80  Pulse 80  Ht 5\' 1"  (1.549 m)  Wt 237 lb (107.502 kg)  BMI 44.8 kg/m2  LMP 10/13/2012  Breastfeeding? No General appearance: alert, cooperative and no distress Head: Normocephalic, without obvious abnormality, atraumatic Eyes: conjunctivae/corneas clear. PERRL, EOM's intact. Fundi benign. Throat: lips, mucosa, and tongue normal; teeth and gums normal Neck: no adenopathy, supple, symmetrical, trachea midline and thyroid not enlarged, symmetric, no tenderness/mass/nodules Back: symmetric, no curvature. ROM normal. No CVA tenderness. Lungs: clear to auscultation bilaterally Heart: regular rate and rhythm, S1, S2 normal, no murmur, click, rub or gallop Abdomen: soft, non-tender; bowel sounds normal; no masses,  no organomegaly Extremities: extremities normal, atraumatic, no cyanosis or edema Skin: Skin color, texture, turgor normal. No rashes or lesions Neurologic: Grossly normal    Assessment:    Healthy female exam.  Plan:     See After Visit Summary for Counseling Recommendations

## 2012-10-15 NOTE — Assessment & Plan Note (Signed)
Discussed weight loss.  Refer to [facility].  Check TSH, CBC, CMET.

## 2012-10-17 ENCOUNTER — Encounter: Payer: Self-pay | Admitting: Family Medicine

## 2012-11-16 ENCOUNTER — Encounter: Payer: Self-pay | Admitting: Family Medicine

## 2012-11-16 ENCOUNTER — Ambulatory Visit (INDEPENDENT_AMBULATORY_CARE_PROVIDER_SITE_OTHER): Payer: BC Managed Care – PPO | Admitting: Family Medicine

## 2012-11-16 VITALS — BP 115/78 | HR 72 | Ht 61.0 in | Wt 230.0 lb

## 2012-11-16 DIAGNOSIS — E669 Obesity, unspecified: Secondary | ICD-10-CM

## 2012-11-16 DIAGNOSIS — M549 Dorsalgia, unspecified: Secondary | ICD-10-CM

## 2012-11-16 NOTE — Progress Notes (Signed)
S: Pt comes in today for follow up of many issues.  BACK PAIN At last visit, felt to be partially related to large, pendulous breasts.  Was given flexeril + motrin and referral to PT was sent.  Did not ever get PT set up, would like to wait to do this unless it is a requirement for her breast reduction surgery.  She has talked with the insurance company, who does pay for it, but has not had a chance to look online at the criteria-- she will call or fax that list.    Feels like flexeril helps, isn't taking motrin.  No better or worse than last time-- has been a chronic issue and doesn't think it will get much better until she has breast reduction done.   OBESITY Patient is obese.  Recommended meeting with Dr. Gerilyn Pilgrim (nutritionist) at last visit.   Diet: decreased fried foods, increased fruits and other fresh ingredients Exercise: nothing regular Related conditions: back pain  Filed Weights   11/16/12 1353  Weight: 230 lb (104.327 kg)  Down 7lb in 4 weeks!     ROS: Per HPI  History  Smoking status  . Current Every Day Smoker -- 0.30 packs/day  . Types: Cigarettes  Smokeless tobacco  . Never Used    O:  Filed Vitals:   11/16/12 1353  BP: 115/78  Pulse: 72    Gen: NAD, pleasant, obese   A/P: 27 y.o. female p/w obesity, chronic upper back pain -See problem list -f/u in PRN

## 2012-11-16 NOTE — Patient Instructions (Addendum)
I am more than happy to send you to physical therapy if you want--- take a look at the surgery criteria, and if that's on it, just call and I will put the order in. Call and leave a message with what the criteria are (or, if you print out a page you can always have it faxed here as well).  Keep up the great work with the diet changes.  Call if you want to meet with Dr. Gerilyn Pilgrim, our nutritionist.   Try adding in some regular exercise!  Keep thinking about quitting smoking!

## 2012-11-16 NOTE — Assessment & Plan Note (Signed)
Some weight loss with diet changes, declines Dr. Gerilyn Pilgrim today.  Will keep trying on her own.  Encouraged adding exercise.

## 2012-11-16 NOTE — Assessment & Plan Note (Signed)
Pt declines PT at this time-- does not feel like it is worsening or needs anything acutely.  Will call with or fax list of criteria for her insurance to cover breast reduction surgery and I will look into if she qualifies or what she needs to do in order to be covered.  I think pt would definitely benefit from a reduction.

## 2013-02-14 ENCOUNTER — Ambulatory Visit (INDEPENDENT_AMBULATORY_CARE_PROVIDER_SITE_OTHER): Payer: BC Managed Care – PPO | Admitting: Family Medicine

## 2013-02-14 ENCOUNTER — Encounter: Payer: Self-pay | Admitting: Family Medicine

## 2013-02-14 VITALS — BP 114/62 | HR 78 | Temp 99.5°F | Ht 63.0 in | Wt 225.0 lb

## 2013-02-14 DIAGNOSIS — F17200 Nicotine dependence, unspecified, uncomplicated: Secondary | ICD-10-CM

## 2013-02-14 DIAGNOSIS — M549 Dorsalgia, unspecified: Secondary | ICD-10-CM

## 2013-02-14 DIAGNOSIS — A6000 Herpesviral infection of urogenital system, unspecified: Secondary | ICD-10-CM

## 2013-02-14 DIAGNOSIS — E669 Obesity, unspecified: Secondary | ICD-10-CM

## 2013-02-14 LAB — LIPID PANEL
Cholesterol: 145 mg/dL (ref 0–200)
Triglycerides: 52 mg/dL (ref <150)
VLDL: 10 mg/dL (ref 0–40)

## 2013-02-14 NOTE — Patient Instructions (Signed)
Thank you for coming in, today! We will check your lab work today. I will fill out the form you brought and then leave it at the front desk for you. When it's ready to pick up, someone will call you. If you have any questions or concerns, call any time. Otherwise, we'll plan to see you back in 3-4 months, for regular follow up. Please feel free to call with any questions or concerns at any time, at [phone]. --Dr. Casper Harrison

## 2013-02-16 ENCOUNTER — Telehealth: Payer: Self-pay | Admitting: Family Medicine

## 2013-02-16 DIAGNOSIS — A6000 Herpesviral infection of urogenital system, unspecified: Secondary | ICD-10-CM | POA: Insufficient documentation

## 2013-02-16 NOTE — Assessment & Plan Note (Signed)
Has cut back to 4-5 cigarettes per day. Discussed 1-800-QUIT-NOW number. Will refer to health coaching. Pt seems interested in help stopping.

## 2013-02-16 NOTE — Assessment & Plan Note (Signed)
Uncertain last outbreak, but has been present "for years." Pt has Valtrex at home to take at first sign of outbreak. Follow up PRN.

## 2013-02-16 NOTE — Assessment & Plan Note (Signed)
A: Chronic in nature, decently controlled with Flexeril and occasional ibuprofen. Pt actively working with her insurance company to try to get breast reduction, and working on weight loss in general.  P: Continue Flexeril. Follow up PRN.

## 2013-02-16 NOTE — Assessment & Plan Note (Addendum)
Actively making lifestyle changes, has declined referral to Dr. Gerilyn Pilgrim. Encouraged general diet and weight loss strategies. Will continue to follow up as needed. Lipid panel today for results to record on pt's insurance form.

## 2013-02-16 NOTE — Telephone Encounter (Signed)
Pt notified.  Sahaana Weitman L, CMA  

## 2013-02-16 NOTE — Telephone Encounter (Signed)
Please call patient to let her know that her medical form that she gave me on 8/25 is ready for her to pick up at the front desk. Thanks! --CMS

## 2013-02-16 NOTE — Progress Notes (Signed)
  Subjective:    Patient ID: Candice [name], female    DOB: January 05, 1986, 27 y.o.   MRN: [id number]  HPI: Pt presents to clinic for insurance form to be filled out, and for general follow-up. Pt brings in form that needs glucose reading and lipid panel results. Pt is not fasting today.  Chronic back pain/muscle spasms - present for years, well-controlled with Flexeril and occasional ibuprofen -mostly in her upper back, and pt attributes it mostly to her weight -pt is working on [employer] problems" in order to get a breast reduction  Herpes outbreak - pt takes Valtrex for outbreaks, perhaps once every 3-4 months -uncertain last outbreak, but hasn't taken it "in a while" -no current active lesions  Tobacco use - currently smoking 4-5 cigarettes per day, interested in help quitting -has not talked to health coach or used the 1-800-QUIT-NOW number, but is interested  Obesity - pt actively trying to exercise more, eat more vegetables, cut out sweet foods -no history of diabetes personally  Pt is a current smoker. Pt works as a Curator. In addition to the above documentation, pt's PMH, surgical history, FH, and SH all reviewed and updated where appropriate in the EMR. I have also reviewed and updated the pt's allergies and current medications as appropriate.  Review of Systems: Generally feels well. Otherwise, full 12-system ROS was reviewed and all negative.     Objective:   Physical Exam BP 114/62  Pulse 78  Temp(Src) 99.5 F (37.5 C) (Oral)  Ht 5\' 3"  (1.6 m)  Wt 225 lb (102.059 kg)  BMI 39.87 kg/m2 Gen: well-appearing adult female in NAD HEENT: [place]/AT, sclerae/conjunctivae clear, no lid lag, EOMI, PERRLA   MMM, posterior oropharynx clear, no cervical lymphadenopathy  neck supple with full ROM, no masses appreciated; thyroid not enlarged  Cardio: RRR, no murmur appreciated; distal pulses intact/symmetric Pulm: CTAB, no wheezes, normal WOB  Abd: obese, soft, nondistended,  BS+, no HSM Ext: warm/well-perfused, no cyanosis/clubbing/edema MSK: strength 5/5 in all four extremities, no frank joint deformity/effusion  normal ROM to all four extremities with no point muscle/bony tenderness in spine Neuro/Psych: alert/oriented, sensation grossly intact; normal gait/balance  mood euthymic with congruent affect    Assessment & Plan:

## 2013-04-26 ENCOUNTER — Emergency Department (INDEPENDENT_AMBULATORY_CARE_PROVIDER_SITE_OTHER)
Admission: EM | Admit: 2013-04-26 | Discharge: 2013-04-26 | Disposition: A | Discharge status: Home / Self Care | Payer: BC Managed Care – PPO | Source: Home / Self Care

## 2013-04-26 ENCOUNTER — Encounter (HOSPITAL_COMMUNITY): Payer: Self-pay | Admitting: Emergency Medicine

## 2013-04-26 DIAGNOSIS — S0501XA Injury of conjunctiva and corneal abrasion without foreign body, right eye, initial encounter: Secondary | ICD-10-CM

## 2013-04-26 DIAGNOSIS — S058X9A Other injuries of unspecified eye and orbit, initial encounter: Secondary | ICD-10-CM

## 2013-04-26 MED ORDER — TETRACAINE HCL 0.5 % OP SOLN
OPHTHALMIC | Status: AC
  Filled 2013-04-26: qty 2

## 2013-04-26 MED ORDER — HYDROCODONE-ACETAMINOPHEN 5-325 MG PO TABS: ORAL_TABLET | ORAL | Status: DC

## 2013-04-26 MED ORDER — KETOTIFEN FUMARATE 0.025 % OP SOLN: 1.0000 [drp] | Freq: Two times a day (BID) | OPHTHALMIC | Status: DC

## 2013-04-26 MED ORDER — POLYMYXIN B-TRIMETHOPRIM 10000-0.1 UNIT/ML-% OP SOLN: 1.0000 [drp] | OPHTHALMIC | Status: DC

## 2013-04-26 NOTE — ED Notes (Signed)
Pt c/o inj to right eye onset last night while play fighting w/son Reports son poked her eye w/finger Sxs include: teary, redness, pain, blurry vision Alert w/no signs of acute distress.

## 2013-04-26 NOTE — ED Provider Notes (Signed)
CSN: [id number]     Arrival date & time 04/26/13  1519 History   First MD Initiated Contact with Patient 04/26/13 1626     Chief Complaint  Patient presents with  . Eye Injury   (Consider location/radiation/quality/duration/timing/severity/associated sxs/prior Treatment) HPI Comments: Playing with child yesterday evening and was accidentally poked in the OD.  Developed pain, tearing, redness and swelling over the ensuing hours.   Past Medical History  Diagnosis Date  . No pertinent past medical history   . Depression   . HSV 06/28/2007  . Complication of anesthesia     epidural with last pregnancy made entire left side numb, had to d/c   Past Surgical History  Procedure Laterality Date  . Cesarean section    . Iud removal  2005    in cervix  . Cesarean section  09/07/2011    Procedure: CESAREAN SECTION;  Surgeon: Lazaro Arms, MD;  Location: [facility];  Service: Gynecology;  Laterality: N/A;  Repeat cesarean section with delivery of baby boy at 93. Apgars 9/9.  Bilateral tubal ligation.   Family History  Problem Relation Age of Onset  . Alcohol abuse Mother   . Drug abuse Mother   . Alcohol abuse Father   . Mental illness Sister     bipolar ptsd  . Diabetes Maternal Grandmother   . Hypertension Maternal Grandmother   . Cancer Maternal Grandmother     ovarian, breast and lung   History  Substance Use Topics  . Smoking status: Current Every Day Smoker -- 0.30 packs/day    Types: Cigarettes  . Smokeless tobacco: Never Used  . Alcohol Use: No   OB History   Grav Para Term Preterm Abortions TAB SAB Ect Mult Living   2 2 2  0 0 0 0 0 0 2     Review of Systems  Constitutional: Negative.   HENT: Negative.   Eyes: Positive for photophobia, pain, discharge, redness and visual disturbance.  Respiratory: Negative.   All other systems reviewed and are negative.    Allergies  Review of patient's allergies indicates no known allergies.  Home Medications   Current  Outpatient Rx  Name  Route  Sig  Dispense  Refill  . cyclobenzaprine (FLEXERIL) 10 MG tablet   Oral   Take 0.5-1 tablets (5-10 mg total) by mouth 3 (three) times daily as needed for muscle spasms.   30 tablet   0   . ibuprofen (ADVIL,MOTRIN) 800 MG tablet   Oral   Take 1 tablet (800 mg total) by mouth 3 (three) times daily.   21 tablet   0   . ketotifen (ZADITOR) 0.025 % ophthalmic solution   Both Eyes   Place 1 drop into both eyes 2 (two) times daily.   5 mL   0   . trimethoprim-polymyxin b (POLYTRIM) ophthalmic solution   Right Eye   Place 1 drop into the right eye every 4 (four) hours.   10 mL   0   . valACYclovir (VALTREX) 500 MG tablet   Oral   Take 1 tablet (500 mg total) by mouth 2 (two) times daily. X 3 days   12 tablet   2    BP 123/85  Pulse 84  Temp(Src) 98.4 F (36.9 C) (Oral)  Resp 18  SpO2 98%  LMP 04/19/2013  Breastfeeding? No Physical Exam  Nursing note and vitals reviewed. Constitutional: She is oriented to person, place, and time. She appears well-developed and well-nourished. No distress.  Eyes: EOM are normal. Pupils are equal, round, and reactive to light.  Sclera injected. Lower lid conjunctival redness and mild swelling.   Neck: Normal range of motion. Neck supple.  Pulmonary/Chest: Effort normal. No respiratory distress.  Musculoskeletal: She exhibits no edema.  Neurological: She is alert and oriented to person, place, and time. She exhibits normal muscle tone.  Skin: Skin is warm and dry.    ED Course  Procedures (including critical care time) Labs Review Labs Reviewed - No data to display Imaging Review No results found.    MDM   1. Corneal abrasion, right, initial encounter    polytrim and zaditor as directed Off work tomorrow If worse see opthalmologist as above.   Tetracaine gtts 2 OD Stained with flouro and examined with blue light to show 4 corneal abrasions over the iris. # are pinpoint size , the other at 7  O'clock approx 4mm in length. No FB seen. Eye irrigated with eye wash.  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  Hayden Rasmussen, NP 04/26/13 1700

## 2013-04-27 NOTE — ED Provider Notes (Signed)
Medical screening examination/treatment/procedure(s) were performed by resident physician or non-physician practitioner and as supervising physician I was immediately available for consultation/collaboration.   Alisyn Lequire DOUGLAS MD.   Suann Klier D Naliyah Neth, MD 04/27/13 2016 

## 2013-06-08 ENCOUNTER — Encounter (HOSPITAL_COMMUNITY): Payer: Self-pay | Admitting: Emergency Medicine

## 2013-06-08 ENCOUNTER — Inpatient Hospital Stay (HOSPITAL_COMMUNITY)
Admission: AD | Admit: 2013-06-08 | Discharge: 2013-06-10 | DRG: 885 | Disposition: A | Discharge status: Home / Self Care | Payer: BC Managed Care – PPO | Source: Intra-hospital | Attending: Psychiatry | Admitting: Psychiatry

## 2013-06-08 ENCOUNTER — Emergency Department (HOSPITAL_COMMUNITY)
Admission: EM | Admit: 2013-06-08 | Discharge: 2013-06-08 | Disposition: A | Discharge status: Other Acute Inpatient Hospital | Payer: BC Managed Care – PPO | Attending: Emergency Medicine | Admitting: Emergency Medicine

## 2013-06-08 ENCOUNTER — Ambulatory Visit: Payer: BC Managed Care – PPO

## 2013-06-08 ENCOUNTER — Encounter (HOSPITAL_COMMUNITY): Payer: Self-pay | Admitting: *Deleted

## 2013-06-08 DIAGNOSIS — F332 Major depressive disorder, recurrent severe without psychotic features: Secondary | ICD-10-CM

## 2013-06-08 DIAGNOSIS — Z79899 Other long term (current) drug therapy: Secondary | ICD-10-CM

## 2013-06-08 DIAGNOSIS — F331 Major depressive disorder, recurrent, moderate: Secondary | ICD-10-CM | POA: Diagnosis present

## 2013-06-08 DIAGNOSIS — Z5987 Material hardship due to limited financial resources, not elsewhere classified: Secondary | ICD-10-CM

## 2013-06-08 DIAGNOSIS — Z3202 Encounter for pregnancy test, result negative: Secondary | ICD-10-CM | POA: Insufficient documentation

## 2013-06-08 DIAGNOSIS — Z5989 Other problems related to housing and economic circumstances: Secondary | ICD-10-CM

## 2013-06-08 DIAGNOSIS — Z8619 Personal history of other infectious and parasitic diseases: Secondary | ICD-10-CM | POA: Insufficient documentation

## 2013-06-08 DIAGNOSIS — F411 Generalized anxiety disorder: Secondary | ICD-10-CM | POA: Diagnosis present

## 2013-06-08 DIAGNOSIS — F329 Major depressive disorder, single episode, unspecified: Secondary | ICD-10-CM | POA: Insufficient documentation

## 2013-06-08 DIAGNOSIS — B9689 Other specified bacterial agents as the cause of diseases classified elsewhere: Secondary | ICD-10-CM

## 2013-06-08 DIAGNOSIS — F32A Depression, unspecified: Secondary | ICD-10-CM

## 2013-06-08 DIAGNOSIS — B349 Viral infection, unspecified: Secondary | ICD-10-CM

## 2013-06-08 DIAGNOSIS — A6000 Herpesviral infection of urogenital system, unspecified: Secondary | ICD-10-CM

## 2013-06-08 DIAGNOSIS — F17200 Nicotine dependence, unspecified, uncomplicated: Secondary | ICD-10-CM

## 2013-06-08 DIAGNOSIS — M549 Dorsalgia, unspecified: Secondary | ICD-10-CM

## 2013-06-08 DIAGNOSIS — R1031 Right lower quadrant pain: Secondary | ICD-10-CM

## 2013-06-08 DIAGNOSIS — R300 Dysuria: Secondary | ICD-10-CM

## 2013-06-08 DIAGNOSIS — Z598 Other problems related to housing and economic circumstances: Secondary | ICD-10-CM

## 2013-06-08 DIAGNOSIS — E669 Obesity, unspecified: Secondary | ICD-10-CM

## 2013-06-08 DIAGNOSIS — F3289 Other specified depressive episodes: Secondary | ICD-10-CM | POA: Insufficient documentation

## 2013-06-08 DIAGNOSIS — Z01419 Encounter for gynecological examination (general) (routine) without abnormal findings: Secondary | ICD-10-CM

## 2013-06-08 DIAGNOSIS — R45851 Suicidal ideations: Secondary | ICD-10-CM

## 2013-06-08 HISTORY — DX: Headache, unspecified: R51.9

## 2013-06-08 HISTORY — DX: Major depressive disorder, recurrent, moderate: F33.1

## 2013-06-08 HISTORY — DX: Headache: R51

## 2013-06-08 LAB — CBC WITH DIFFERENTIAL/PLATELET
Basophils Absolute: 0 10*3/uL (ref 0.0–0.1)
Basophils Relative: 0 % (ref 0–1)
Eosinophils Absolute: 0 10*3/uL (ref 0.0–0.7)
Eosinophils Relative: 0 % (ref 0–5)
HCT: 38.4 % (ref 36.0–46.0)
Hemoglobin: 12.3 g/dL (ref 12.0–15.0)
Lymphocytes Relative: 21 % (ref 12–46)
MCH: 26.2 pg (ref 26.0–34.0)
MCHC: 32 g/dL (ref 30.0–36.0)
MCV: 81.9 fL (ref 78.0–100.0)
Monocytes Absolute: 0.5 10*3/uL (ref 0.1–1.0)
Monocytes Relative: 5 % (ref 3–12)
Platelets: 277 10*3/uL (ref 150–400)
RDW: 15.4 % (ref 11.5–15.5)
WBC: 10.3 10*3/uL (ref 4.0–10.5)

## 2013-06-08 LAB — RAPID URINE DRUG SCREEN, HOSP PERFORMED
Amphetamines: NOT DETECTED
Barbiturates: NOT DETECTED
Benzodiazepines: NOT DETECTED

## 2013-06-08 LAB — BASIC METABOLIC PANEL
BUN: 9 mg/dL (ref 6–23)
CO2: 25 mEq/L (ref 19–32)
Calcium: 9.1 mg/dL (ref 8.4–10.5)
Chloride: 103 mEq/L (ref 96–112)
Creatinine, Ser: 0.63 mg/dL (ref 0.50–1.10)
GFR calc non Af Amer: >90 mL/min (ref >90)

## 2013-06-08 LAB — URINALYSIS, ROUTINE W REFLEX MICROSCOPIC
Glucose, UA: NEGATIVE mg/dL
Hgb urine dipstick: NEGATIVE
Ketones, ur: 15 mg/dL — AB
Protein, ur: NEGATIVE mg/dL
Urobilinogen, UA: 1 mg/dL (ref 0.0–1.0)

## 2013-06-08 LAB — ACETAMINOPHEN LEVEL: Acetaminophen (Tylenol), Serum: <15 ug/mL (ref 10–30)

## 2013-06-08 LAB — ETHANOL: Alcohol, Ethyl (B): <11 mg/dL (ref 0–11)

## 2013-06-08 LAB — SALICYLATE LEVEL: Salicylate Lvl: <2 mg/dL — ABNORMAL LOW (ref 2.8–20.0)

## 2013-06-08 MED ORDER — ZOLPIDEM TARTRATE 5 MG PO TABS: 5.0000 mg | ORAL_TABLET | Freq: Every evening | ORAL | Status: DC | PRN

## 2013-06-08 MED ORDER — ALUM & MAG HYDROXIDE-SIMETH 200-200-20 MG/5ML PO SUSP: 30.0000 mL | ORAL | Status: DC | PRN

## 2013-06-08 MED ORDER — ACETAMINOPHEN 325 MG PO TABS: 650.0000 mg | ORAL_TABLET | Freq: Four times a day (QID) | ORAL | Status: DC | PRN

## 2013-06-08 MED ORDER — METHOCARBAMOL 500 MG PO TABS: 750.0000 mg | ORAL_TABLET | Freq: Four times a day (QID) | ORAL | Status: DC | PRN

## 2013-06-08 MED ORDER — NICOTINE 14 MG/24HR TD PT24
14.0000 mg | MEDICATED_PATCH | Freq: Every day | TRANSDERMAL | Status: DC
  Administered 2013-06-09: 14 mg via TRANSDERMAL
  Filled 2013-06-08 (×6): qty 1

## 2013-06-08 MED ORDER — HYDROCORTISONE ACETATE 0.5 % EX CREA: TOPICAL_CREAM | Freq: Every day | CUTANEOUS | Status: DC | PRN

## 2013-06-08 MED ORDER — ONDANSETRON HCL 4 MG PO TABS: 4.0000 mg | ORAL_TABLET | Freq: Three times a day (TID) | ORAL | Status: DC | PRN

## 2013-06-08 MED ORDER — ZOLPIDEM TARTRATE 5 MG PO TABS
5.0000 mg | ORAL_TABLET | Freq: Every evening | ORAL | Status: DC | PRN
  Administered 2013-06-08 – 2013-06-09 (×2): 5 mg via ORAL
  Filled 2013-06-08 (×2): qty 1

## 2013-06-08 MED ORDER — NICOTINE POLACRILEX 2 MG MT GUM
2.0000 mg | CHEWING_GUM | OROMUCOSAL | Status: DC | PRN
  Administered 2013-06-09 (×2): 2 mg via ORAL

## 2013-06-08 MED ORDER — NICOTINE 21 MG/24HR TD PT24
21.0000 mg | MEDICATED_PATCH | Freq: Every day | TRANSDERMAL | Status: DC
  Filled 2013-06-08: qty 1

## 2013-06-08 MED ORDER — SERTRALINE HCL 50 MG PO TABS
50.0000 mg | ORAL_TABLET | Freq: Every day | ORAL | Status: DC
  Administered 2013-06-09 – 2013-06-10 (×2): 50 mg via ORAL
  Filled 2013-06-08 (×3): qty 1

## 2013-06-08 MED ORDER — IBUPROFEN 200 MG PO TABS: 600.0000 mg | ORAL_TABLET | Freq: Three times a day (TID) | ORAL | Status: DC | PRN

## 2013-06-08 MED ORDER — NICOTINE 21 MG/24HR TD PT24
21.0000 mg | MEDICATED_PATCH | Freq: Every day | TRANSDERMAL | Status: DC
  Administered 2013-06-08: 21 mg via TRANSDERMAL
  Filled 2013-06-08: qty 1

## 2013-06-08 MED ORDER — HYDROCORTISONE 0.5 % EX CREA: TOPICAL_CREAM | Freq: Every day | CUTANEOUS | Status: DC | PRN

## 2013-06-08 MED ORDER — ACETAMINOPHEN 325 MG PO TABS
650.0000 mg | ORAL_TABLET | Freq: Once | ORAL | Status: AC
  Administered 2013-06-08: 650 mg via ORAL
  Filled 2013-06-08: qty 2

## 2013-06-08 MED ORDER — MAGNESIUM HYDROXIDE 400 MG/5ML PO SUSP: 30.0000 mL | Freq: Every day | ORAL | Status: DC | PRN

## 2013-06-08 MED ORDER — SERTRALINE HCL 100 MG PO TABS
100.0000 mg | ORAL_TABLET | Freq: Every day | ORAL | Status: DC
  Filled 2013-06-08: qty 1

## 2013-06-08 MED ORDER — SERTRALINE HCL 50 MG PO TABS: 100.0000 mg | ORAL_TABLET | Freq: Every day | ORAL | Status: DC

## 2013-06-08 MED ORDER — SERTRALINE HCL 50 MG PO TABS
50.0000 mg | ORAL_TABLET | Freq: Every day | ORAL | Status: DC
  Administered 2013-06-08: 50 mg via ORAL
  Filled 2013-06-08: qty 1

## 2013-06-08 MED ORDER — ACETAMINOPHEN 325 MG PO TABS
650.0000 mg | ORAL_TABLET | ORAL | Status: DC | PRN
  Administered 2013-06-08 – 2013-06-09 (×2): 650 mg via ORAL
  Filled 2013-06-08 (×2): qty 2

## 2013-06-08 MED ORDER — IBUPROFEN 600 MG PO TABS
600.0000 mg | ORAL_TABLET | Freq: Three times a day (TID) | ORAL | Status: DC | PRN
  Administered 2013-06-10: 600 mg via ORAL
  Filled 2013-06-08: qty 1

## 2013-06-08 MED ORDER — HYDROCORTISONE 0.5 % EX CREA
TOPICAL_CREAM | Freq: Every day | CUTANEOUS | Status: DC | PRN
  Filled 2013-06-08: qty 28.35

## 2013-06-08 MED ORDER — ACETAMINOPHEN 325 MG PO TABS
650.0000 mg | ORAL_TABLET | ORAL | Status: DC | PRN
  Administered 2013-06-08: 650 mg via ORAL
  Filled 2013-06-08: qty 2

## 2013-06-08 NOTE — ED Notes (Signed)
Report called to [facility] nurse. Accepted by Dr.Jonnalagadda to room 506-1. Will wait until 1745 to call [facility] as requested by [employer]. Has an admission in route to [facility].

## 2013-06-08 NOTE — Consult Note (Signed)
[facility] Face-to-Face Psychiatry Consult   Reason for Consult:  Worsening depression Referring Physician:  EDP  Candice [name] is an 27 y.o. female.  Assessment: AXIS I:  Major Depression, Recurrent severe AXIS II:  Deferred AXIS III:   Past Medical History  Diagnosis Date  . Depression   . HSV 06/28/2007  . Complication of anesthesia     epidural with last pregnancy made entire left side numb, had to d/c  . Headache    AXIS IV:  housing problems, other psychosocial or environmental problems, problems related to social environment and problems with primary support group AXIS V:  11-20 some danger of hurting self or others possible OR occasionally fails to maintain minimal personal hygiene OR gross impairment in communication  Plan:  Recommend psychiatric Inpatient admission when medically cleared.  Subjective:   Candice [name] is a 27 y.o. female patient admitted with Major Depressive Disorder, recurrent without psychosis.  HPI:  Patient states that she has so much going on.  "I just lost my apartment cause I couldn't afford it; almost lost my car.  I had to move in with the father of my baby and we are not getting along right now so I have all of my stuff there and I house hop between him and my sister.  I can't live with my sister because she has real mental issues diagnosed.  I having trouble at my job.  I like what I do just not where am at."  Patient states that she has a history of depression "I am never up beat just some where in between; never happy.  I have always been able to put on a face; but people are beginning to notice and are calling me out; like, what's wrong; what's going on? Patient states that 10 years ago she tried to kill herself with overdose.  States at that time she was started on Zoloft which worked.  Patient continues to endorse suicidal ideation.  Patient also states that she is not hearing voices but see's visions in her dreams of things that she can do to her  self to kill herself.  Patient denies psychosis, paranoia, and homicidal ideation.      HPI Elements:   Location:  [facility] ED. Quality:  Affecting patient mentally and physically. Severity:  Thoughts of suicide and worsening depression.  Past Psychiatric History: Past Medical History  Diagnosis Date  . Depression   . HSV 06/28/2007  . Complication of anesthesia     epidural with last pregnancy made entire left side numb, had to d/c  . Headache     reports that she has been smoking Cigarettes.  She has been smoking about 0.25 packs per day. She has never used smokeless tobacco. She reports that she drinks alcohol. She reports that she uses illicit drugs (Marijuana) about 4 times per week. Family History  Problem Relation Age of Onset  . Alcohol abuse Mother   . Drug abuse Mother   . Alcohol abuse Father   . Mental illness Sister     bipolar ptsd  . Diabetes Maternal Grandmother   . Hypertension Maternal Grandmother   . Cancer Maternal Grandmother     ovarian, breast and lung           Allergies:  No Known Allergies  ACT Assessment Complete:  No:   Past Psychiatric History: Diagnosis:  Major depressive disorder, recurrent without psychosis  Hospitalizations:  Yes, past history 10 yrs ago  Outpatient  Care:  Denies at this time  Substance Abuse Care:  THC positive on UDS  Self-Mutilation:  Denies  Suicidal Attempts:  10 yrs ago overdose  Homicidal Behaviors:  Denies   Violent Behaviors:  Denies   Place of Residence:  [place] (house hop between sister and father of child) Marital Status:  Single Employed/Unemployed:  Employed Education:   Family Supports:  Yes Objective: Blood pressure 111/69, pulse 85, temperature 97.6 F (36.4 C), temperature source Oral, resp. rate 18, last menstrual period 05/18/2013, SpO2 99.00%.There is no weight on file to calculate BMI. Results for orders placed during the hospital encounter of 06/08/13 (from the past 72 hour(s))   CBC WITH DIFFERENTIAL     Status: None   Collection Time    06/08/13 10:35 AM      Result Value Range   WBC 10.3  4.0 - 10.5 K/uL   RBC 4.69  3.87 - 5.11 MIL/uL   Hemoglobin 12.3  12.0 - 15.0 g/dL   HCT 16.1  09.6 - 04.5 %   MCV 81.9  78.0 - 100.0 fL   MCH 26.2  26.0 - 34.0 pg   MCHC 32.0  30.0 - 36.0 g/dL   RDW 40.9  81.1 - 91.4 %   Platelets 277  150 - 400 K/uL   Neutrophils Relative % 73  43 - 77 %   Neutro Abs 7.6  1.7 - 7.7 K/uL   Lymphocytes Relative 21  12 - 46 %   Lymphs Abs 2.2  0.7 - 4.0 K/uL   Monocytes Relative 5  3 - 12 %   Monocytes Absolute 0.5  0.1 - 1.0 K/uL   Eosinophils Relative 0  0 - 5 %   Eosinophils Absolute 0.0  0.0 - 0.7 K/uL   Basophils Relative 0  0 - 1 %   Basophils Absolute 0.0  0.0 - 0.1 K/uL  BASIC METABOLIC PANEL     Status: Abnormal   Collection Time    06/08/13 10:35 AM      Result Value Range   Sodium 137  135 - 145 mEq/L   Potassium 3.4 (*) 3.5 - 5.1 mEq/L   Chloride 103  96 - 112 mEq/L   CO2 25  19 - 32 mEq/L   Glucose, Bld 99  70 - 99 mg/dL   BUN 9  6 - 23 mg/dL   Creatinine, Ser 7.82  0.50 - 1.10 mg/dL   Calcium 9.1  8.4 - 95.6 mg/dL   GFR calc non Af Amer >90  >90 mL/min   GFR calc Af Amer >90  >90 mL/min   Comment: (NOTE)     The eGFR has been calculated using the CKD EPI equation.     This calculation has not been validated in all clinical situations.     eGFR's persistently <90 mL/min signify possible Chronic Kidney     Disease.  ETHANOL     Status: None   Collection Time    06/08/13 10:35 AM      Result Value Range   Alcohol, Ethyl (B) <11  0 - 11 mg/dL   Comment:            LOWEST DETECTABLE LIMIT FOR     SERUM ALCOHOL IS 11 mg/dL     FOR MEDICAL PURPOSES ONLY  ACETAMINOPHEN LEVEL     Status: None   Collection Time    06/08/13 10:35 AM      Result Value Range   Acetaminophen (Tylenol), Serum <  15.0  10 - 30 ug/mL   Comment:            THERAPEUTIC CONCENTRATIONS VARY     SIGNIFICANTLY. A RANGE OF 10-30     ug/mL  MAY BE AN EFFECTIVE     CONCENTRATION FOR MANY PATIENTS.     HOWEVER, SOME ARE BEST TREATED     AT CONCENTRATIONS OUTSIDE THIS     RANGE.     ACETAMINOPHEN CONCENTRATIONS     >150 ug/mL AT 4 HOURS AFTER     INGESTION AND >50 ug/mL AT 12     HOURS AFTER INGESTION ARE     OFTEN ASSOCIATED WITH TOXIC     REACTIONS.  SALICYLATE LEVEL     Status: Abnormal   Collection Time    06/08/13 10:35 AM      Result Value Range   Salicylate Lvl <2.0 (*) 2.8 - 20.0 mg/dL  URINALYSIS, ROUTINE W REFLEX MICROSCOPIC     Status: Abnormal   Collection Time    06/08/13 10:54 AM      Result Value Range   Color, Urine AMBER (*) YELLOW   Comment: BIOCHEMICALS MAY BE AFFECTED BY COLOR   APPearance CLEAR  CLEAR   Specific Gravity, Urine 1.034 (*) 1.005 - 1.030   pH 7.0  5.0 - 8.0   Glucose, UA NEGATIVE  NEGATIVE mg/dL   Hgb urine dipstick NEGATIVE  NEGATIVE   Bilirubin Urine SMALL (*) NEGATIVE   Ketones, ur 15 (*) NEGATIVE mg/dL   Protein, ur NEGATIVE  NEGATIVE mg/dL   Urobilinogen, UA 1.0  0.0 - 1.0 mg/dL   Nitrite NEGATIVE  NEGATIVE   Leukocytes, UA NEGATIVE  NEGATIVE   Comment: MICROSCOPIC NOT DONE ON URINES WITH NEGATIVE PROTEIN, BLOOD, LEUKOCYTES, NITRITE, OR GLUCOSE <1000 mg/dL.  PREGNANCY, URINE     Status: None   Collection Time    06/08/13 10:54 AM      Result Value Range   Preg Test, Ur NEGATIVE  NEGATIVE   Comment:            THE SENSITIVITY OF THIS     METHODOLOGY IS >20 mIU/mL.  URINE RAPID DRUG SCREEN (HOSP PERFORMED)     Status: Abnormal   Collection Time    06/08/13 10:54 AM      Result Value Range   Opiates NONE DETECTED  NONE DETECTED   Cocaine NONE DETECTED  NONE DETECTED   Benzodiazepines NONE DETECTED  NONE DETECTED   Amphetamines NONE DETECTED  NONE DETECTED   Tetrahydrocannabinol POSITIVE (*) NONE DETECTED   Barbiturates NONE DETECTED  NONE DETECTED   Comment:            DRUG SCREEN FOR MEDICAL PURPOSES     ONLY.  IF CONFIRMATION IS NEEDED     FOR ANY PURPOSE, NOTIFY  LAB     WITHIN 5 DAYS.                LOWEST DETECTABLE LIMITS     FOR URINE DRUG SCREEN     Drug Class       Cutoff (ng/mL)     Amphetamine      1000     Barbiturate      200     Benzodiazepine   200     Tricyclics       300     Opiates          300     Cocaine  300     THC              50     Current Facility-Administered Medications  Medication Dose Route Frequency Provider Last Rate Last Dose  . acetaminophen (TYLENOL) tablet 650 mg  650 mg Oral Q4H PRN Dorthula Matas, PA-C      . alum & mag hydroxide-simeth (MAALOX/MYLANTA) 200-200-20 MG/5ML suspension 30 mL  30 mL Oral PRN Dorthula Matas, PA-C      . ibuprofen (ADVIL,MOTRIN) tablet 600 mg  600 mg Oral Q8H PRN Dorthula Matas, PA-C      . nicotine (NICODERM CQ - dosed in mg/24 hours) patch 21 mg  21 mg Transdermal Daily Dorthula Matas, PA-C   21 mg at 06/08/13 1129  . ondansetron (ZOFRAN) tablet 4 mg  4 mg Oral Q8H PRN Dorthula Matas, PA-C      . zolpidem (AMBIEN) tablet 5 mg  5 mg Oral QHS PRN Dorthula Matas, PA-C       Current Outpatient Prescriptions  Medication Sig Dispense Refill  . HYDROCORTISONE ACETATE EX Apply 1 application topically daily as needed (rash).      . methocarbamol (ROBAXIN) 750 MG tablet Take 750 mg by mouth every 6 (six) hours as needed for muscle spasms.        Psychiatric Specialty Exam:     Blood pressure 111/69, pulse 85, temperature 97.6 F (36.4 C), temperature source Oral, resp. rate 18, last menstrual period 05/18/2013, SpO2 99.00%.There is no weight on file to calculate BMI.  General Appearance: Casual  Eye Contact::  Good  Speech:  Clear and Coherent and Normal Rate  Volume:  Normal  Mood:  Anxious, Depressed and Hopeless  Affect:  Congruent, Depressed, Flat and Tearful  Thought Process:  Circumstantial and Goal Directed  Orientation:  Full (Time, Place, and Person)  Thought Content:  "I just got so overwhelmed"  Suicidal Thoughts:  Yes.  with intent/plan   Homicidal Thoughts:  No  Memory:  Immediate;   Good Recent;   Good Remote;   Good  Judgement:  Impaired  Insight:  Lacking  Psychomotor Activity:  Normal  Concentration:  Fair  Recall:  Good  Akathisia:  No  Handed:  Right  AIMS (if indicated):     Assets:  Communication Skills Desire for Improvement  Sleep:      Face to face consult/interview with Dr. Ladona Ridgel  Treatment Plan Summary: Daily contact with patient to assess and evaluate symptoms and progress in treatment Medication management  Disposition:  Inpatient treatment recommended.  Patient accepted by [facility] will call with bed placement.  Start home medications and Zoloft 50 mg today for three days; then increase to 100 mg on (06/11/13).  Monitor for safety and stabilization until transfer to [facility]  Rankin, Shuvon, FNP-BC 06/08/2013 11:47 AM

## 2013-06-08 NOTE — Progress Notes (Signed)
Pt. Is a 27 year old AAF that presents voluntarily after having a "break down" at work today and verbalizing some SI to a Radio broadcast assistant.  Pt. States that she has been having financial issues since August 2014 and things have been getting progressively worse. She states that the father of her child who works in the same place as her, lost his job recently and was unable to help her financially causing her to lose her apartment.  She states that she is currently living with him and that things are awkward since they are not "together", but states that they are having intercourse.  Pt. Reports minimal alcohol and tobacco use but states she does smoke marijuana.  Pt. Admits that she is intermittently having SI at this time, mostly when she gets tearful about her situation and "it all comes back to me".  She verbally contracts for safety with this RN.  Pt. Oriented to the unit without incident, q 15 minute checks continue for patient's safety.

## 2013-06-08 NOTE — ED Notes (Addendum)
Pt. Belonging are-  2 bras- 1 black and 1 leopard, kaki pants, dark blue shirt, bark blue apron,purple cell phone,white jacket, car keys, hair net, 1 gold bracelet, Nurse was notified.

## 2013-06-08 NOTE — ED Notes (Signed)
Pt c/o depression and SI w/ plan to "jump off a bridge or take some pills" x 3 weeks.  Sts "I lost my apartment, my kids father lost his job, and everything is falling apart."  Hx of depression.  C/o constant sharp pain behind R breast, since September.  Sts that "sometimes it increases and makes me SOB."

## 2013-06-08 NOTE — Progress Notes (Addendum)
Patient has been accepted to [facility] Bed 506-1.Dr. Carmelina Dane is the accepting doctor.  Writer informed the ER MD and the nurse.  Writer faxed the support paperwork to [facility].

## 2013-06-08 NOTE — Progress Notes (Signed)
Psychoeducational Group Note  Date:  06/08/2013 Time:  2000  Group Topic/Focus:  Wrap-Up Group:   The focus of this group is to help patients review their daily goal of treatment and discuss progress on daily workbooks.  Participation Level: Did Not Attend  Participation Quality:  Not Applicable  Affect:  Not Applicable  Cognitive:  Not Applicable  Insight:  Not Applicable  Engagement in Group: Not Applicable  Additional Comments:    Flonnie Hailstone 06/08/2013, 9:58 PM

## 2013-06-08 NOTE — ED Notes (Signed)
Notified [facility] of transportation needed to [facility]. 

## 2013-06-08 NOTE — ED Notes (Signed)
[facility] here to transport to [facility]. Belongs bag x2 given to driver. Ambulatory without difficulty. Denies pain. No complaints voiced.

## 2013-06-08 NOTE — ED Provider Notes (Signed)
CSN: [id number]     Arrival date & time 06/08/13  1004 History   First MD Initiated Contact with Patient 06/08/13 1013     Chief Complaint  Patient presents with  . Medical Clearance   (Consider location/radiation/quality/duration/timing/severity/associated sxs/prior Treatment) HPI  Candice [name] is a 27 y.o.female with a significant PMH of depression, HSV, and headaches presents to the ER with complaints of suicidal ideations.  She endorses not feeling "right" for the past 6 months. She says she has been depressed before and taken medication, felt fine then would dc the medication. For financial reasons she and her two young children were made to leave her apartment in early December, forcing her to move in with her Childrens father which she is not happy about. She denies abuse or fear for her safety.  The childrens father and her both were working at [employer] but he was fired last week, she is upset that this happened right before Christmas and is concerned about where they will all live. 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 She reports "loosing her temper and crying in the bathroom". At this point she left work, went into her car and started to think about ways to kill herself. Driving off of a bridge or overdosing on pills. She called Family Medicine about what to do if she felt like harming herself and they told her to go to [employer]. Pt is tearful and says the world may be better off without her in it.   Denies HI, hallucinations or substance abuse. Denies having done anything to harm herself.   Past Medical History  Diagnosis Date  . No pertinent past medical history   . Depression   . HSV 06/28/2007  . Complication of anesthesia     epidural with last pregnancy made entire left side numb, had to d/c   Past Surgical History  Procedure Laterality Date  . Cesarean section    . Iud removal  2005    in cervix  .  Cesarean section  09/07/2011    Procedure: CESAREAN SECTION;  Surgeon: Lazaro Arms, MD;  Location: [facility] ORS;  Service: Gynecology;  Laterality: N/A;  Repeat cesarean section with delivery of baby boy at 10. Apgars 9/9.  Bilateral tubal ligation.   Family History  Problem Relation Age of Onset  . Alcohol abuse Mother   . Drug abuse Mother   . Alcohol abuse Father   . Mental illness Sister     bipolar ptsd  . Diabetes Maternal Grandmother   . Hypertension Maternal Grandmother   . Cancer Maternal Grandmother     ovarian, breast and lung   History  Substance Use Topics  . Smoking status: Current Every Day Smoker -- 0.30 packs/day    Types: Cigarettes  . Smokeless tobacco: Never Used  . Alcohol Use: No   OB History   Grav Para Term Preterm Abortions TAB SAB Ect Mult Living   2 2 2  0 0 0 0 0 0 2     Review of Systems The patient denies anorexia, fever, weight loss, vision loss, decreased hearing, hoarseness, chest pain, syncope, dyspnea on exertion, peripheral edema, balance deficits, hemoptysis, abdominal pain, melena, hematochezia, severe indigestion/heartburn, hematuria, incontinence, genital sores, muscle weakness, suspicious skin lesions, transient blindness, difficulty walking, depression, unusual weight change, abnormal bleeding, enlarged lymph nodes, angioedema, and breast masses.  Allergies  Review of patient's allergies indicates no known allergies.  Home Medications   Current Outpatient Rx  Name  Route  Sig  Dispense  Refill  . HYDROCORTISONE ACETATE EX   Apply externally   Apply 1 application topically daily as needed (rash).         . methocarbamol (ROBAXIN) 750 MG tablet   Oral   Take 750 mg by mouth every 6 (six) hours as needed for muscle spasms.          BP 111/69  Pulse 85  Temp(Src) 97.6 F (36.4 C) (Oral)  Resp 18  SpO2 99% Physical Exam  Nursing note and vitals reviewed. Constitutional: She appears well-developed and well-nourished. No  distress.  HENT:  Head: Normocephalic and atraumatic.  Eyes: Pupils are equal, round, and reactive to light.  Neck: Normal range of motion. Neck supple.  Cardiovascular: Normal rate and regular rhythm.   Pulmonary/Chest: Effort normal.  Abdominal: Soft.  Neurological: She is alert.  Skin: Skin is warm and dry.    ED Course  Procedures (including critical care time) Labs Review Labs Reviewed  URINALYSIS, ROUTINE W REFLEX MICROSCOPIC  PREGNANCY, URINE  CBC WITH DIFFERENTIAL  BASIC METABOLIC PANEL  ETHANOL  URINE RAPID DRUG SCREEN (HOSP PERFORMED)  ACETAMINOPHEN LEVEL  SALICYLATE LEVEL   Imaging Review No results found.  EKG Interpretation   None       MDM   1. Depression   2. Suicidal ideations      Home med rec Screening labs placed TTS consult ordered.    Dorthula Matas, PA-C 06/08/13 1054

## 2013-06-08 NOTE — Consult Note (Signed)
Note reviewed and agreed with  

## 2013-06-09 DIAGNOSIS — R45851 Suicidal ideations: Secondary | ICD-10-CM

## 2013-06-09 DIAGNOSIS — F1210 Cannabis abuse, uncomplicated: Secondary | ICD-10-CM

## 2013-06-09 DIAGNOSIS — F322 Major depressive disorder, single episode, severe without psychotic features: Secondary | ICD-10-CM

## 2013-06-09 DIAGNOSIS — F4310 Post-traumatic stress disorder, unspecified: Secondary | ICD-10-CM

## 2013-06-09 NOTE — ED Provider Notes (Signed)
Medical screening examination/treatment/procedure(s) were performed by non-physician practitioner and as supervising physician I was immediately available for consultation/collaboration.  EKG Interpretation   None         LISANDRA MATHISEN, DO 06/09/13 1427

## 2013-06-09 NOTE — Progress Notes (Signed)
Patient ID: Candice [name], female   DOB: 01/07/1986, 27 y.o.   MRN: [id number]  D: Patient has flat affect and depressed mood and appears at times to be tearful. Pt. Denies SI/HI and A/V Hallucinations to writer but documented on her inventory sheet today that she has had on and off thoughts of SI. Patient states that she can come and talk to staff if she needs to about this SI. Patient does not report any pain this morning but did report some this afternoon. Patient rates her depression at 5/10 today and her hopelessness 3/10 today.  A: Support and encouragement provided to the patient to go to groups and fill out patient inventory sheet. Scheduled medications given to patient per physician order.  R: Patient is receptive and cooperative with Clinical research associate and is seen in the milieu periodically. Patient interacts with staff and with other patients.  Q15 minute checks are maintained for safety.

## 2013-06-09 NOTE — Progress Notes (Signed)
D: Pt is sad in affect and depressed in mood. She is worried about the stability of her job under this new management. Her future includes finding a new place of employment eventually. She is currently denying any desires to harm her boss. She is also denying any SI. She reports to be adjusting well to the unit. She enjoys the groups and sees them as beneficial.  A: Writer administered Ambien for sleep this evening. Continued support and availability as needed was extended to this pt. Staff continue to monitor pt with q75min checks.  R: No adverse drug reactions noted. Pt receptive to treatment. Pt remains safe at this time.

## 2013-06-09 NOTE — [facility] Group Notes (Signed)
[facility] LCSW Group Therapy  Living A Balanced Life  1:15 - 2: 30          06/09/2013  4:21 PM    Type of Therapy:  Group Therapy  Participation Level: Patient left group to meet with PA.   Wynn Banker 06/09/2013  4:21 PM

## 2013-06-09 NOTE — [facility] Suicide Risk Assessment (Signed)
Suicide Risk Assessment  Admission Assessment     Nursing information obtained from:    Demographic factors:    Current Mental Status:    Loss Factors:    Historical Factors:    Risk Reduction Factors:     CLINICAL FACTORS:   Severe Anxiety and/or Agitation Depression:   Aggression Anhedonia Hopelessness Impulsivity Insomnia Recent sense of peace/wellbeing Severe Unstable or Poor Therapeutic Relationship Previous Psychiatric Diagnoses and Treatments  COGNITIVE FEATURES THAT CONTRIBUTE TO RISK:  Closed-mindedness Loss of executive function Polarized thinking Thought constriction (tunnel vision)    SUICIDE RISK:   Moderate:  Frequent suicidal ideation with limited intensity, and duration, some specificity in terms of plans, no associated intent, good self-control, limited dysphoria/symptomatology, some risk factors present, and identifiable protective factors, including available and accessible social support.  PLAN OF CARE: Admit voluntarily under, gently from the [facility] long emergency department from major depressive disorder with suicidal thoughts, patient needed crisis stabilization, safety margin and medication management.  I certify that inpatient services furnished can reasonably be expected to improve the patient's condition.  Candice [name],JANARDHAHA R. 06/09/2013, 1:55 PM

## 2013-06-09 NOTE — Progress Notes (Signed)
Pt attended Karaoke.  
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
## 2013-06-09 NOTE — Progress Notes (Signed)
The focus of this group is to educate the patient on the purpose and policies of crisis stabilization and provide a format to answer questions about their admission.  The group details unit policies and expectations of patients while admitted.  Patient attended 0900 nurse education orientation group this morning.  Patient listened, appropriate affect, alert, appropriate insight and engagement.  Today patient will work on 3 goals for discharge.  

## 2013-06-09 NOTE — Progress Notes (Signed)
D: Writer greeted self to pt. Pt was orientated to the unit's schedule. Pt was informed of ordered medication. Writer verbalized expectations that she should expect during her stay here. Pt reported a headache of a 7 out of 10 at 2151. A: Writer administered prn medications to pt. Continued support and availability as needed was extended to this pt. Staff continue to monitor pt with q11min checks.  R: No adverse drug reactions noted. Pt receptive to treatment. Pt remains safe at this time.

## 2013-06-09 NOTE — Progress Notes (Signed)
Adult Psychoeducational Group Note  Date: 06/09/2013  Time:11:00am  Group Topic/Focus:  Making Healthy Choices: The focus of this group is to help patients identify negative/unhealthy choices they were using prior to admission and identify positive/healthier coping strategies to replace them upon discharge.  Participation Level: Active  Participation Quality: Appropriate, Sharing and Supportive  Affect: Appropriate  Cognitive: Appropriate  Insight: Appropriate  Engagement in Group: Engaged and Supportive  Modes of Intervention: Discussion, Education, Problem-solving and Support  Additional Comments: Pt was appropriate in group.  Isla Pence M  06/09/2013, 6:46 PM

## 2013-06-10 DIAGNOSIS — F411 Generalized anxiety disorder: Secondary | ICD-10-CM

## 2013-06-10 DIAGNOSIS — F332 Major depressive disorder, recurrent severe without psychotic features: Principal | ICD-10-CM

## 2013-06-10 MED ORDER — SERTRALINE HCL 50 MG PO TABS: 50.0000 mg | ORAL_TABLET | Freq: Every day | ORAL | Status: DC

## 2013-06-10 MED ORDER — SERTRALINE HCL 100 MG PO TABS: 100.0000 mg | ORAL_TABLET | Freq: Every day | ORAL | Status: DC

## 2013-06-10 MED ORDER — ZOLPIDEM TARTRATE 5 MG PO TABS: 5.0000 mg | ORAL_TABLET | Freq: Every evening | ORAL | Status: DC | PRN

## 2013-06-10 NOTE — [facility] Suicide Risk Assessment (Signed)
Suicide Risk Assessment  Discharge Assessment     Demographic Factors:  Adolescent or young adult and Low socioeconomic status  Mental Status Per Nursing Assessment::   On Admission:     Current Mental Status by Physician: Patient is calm and cooperative. Patient has good mood with appropriate affect. Patient has normal speech and thought process. Patient has no suicide or homicidal ideation, intention or plans. Patient is no evidence of psychotic symptoms.  Loss Factors: Decrease in vocational status and Financial problems/change in socioeconomic status  Historical Factors: Impulsivity  Risk Reduction Factors:   Responsible for children under 69 years of age, Sense of responsibility to family, Religious beliefs about death, Employed, Living with another person, especially a relative, Positive social support, Positive therapeutic relationship and Positive coping skills or problem solving skills  Continued Clinical Symptoms:  Depression:   Recent sense of peace/wellbeing  Cognitive Features That Contribute To Risk:  Polarized thinking    Suicide Risk:  Minimal: No identifiable suicidal ideation.  Patients presenting with no risk factors but with morbid ruminations; may be classified as minimal risk based on the severity of the depressive symptoms  Discharge Diagnoses:   AXIS I:  Major Depression, Recurrent severe AXIS II:  Deferred AXIS III:   Past Medical History  Diagnosis Date  . Depression   . HSV 06/28/2007  . Complication of anesthesia     epidural with last pregnancy made entire left side numb, had to d/c  . Headache    AXIS IV:  economic problems, other psychosocial or environmental problems, problems related to social environment and problems with primary support group AXIS V:  51-60 moderate symptoms  Plan Of Care/Follow-up recommendations:  Activity:  As tolerated Diet:  Regular  Is patient on multiple antipsychotic therapies at discharge:  No   Has Patient  had three or more failed trials of antipsychotic monotherapy by history:  No  Recommended Plan for Multiple Antipsychotic Therapies: NA  Candice [name],JANARDHAHA R. 06/10/2013, 12:07 PM

## 2013-06-10 NOTE — Discharge Summary (Signed)
Physician Discharge Summary Note  Patient:  Candice [name] is an 27 y.o., female MRN:  [id number] DOB:  August 11, 1985 Patient phone:  [phone] (home)  Patient address:   [address] [place] [address],   Date of Admission:  06/08/2013 Date of Discharge: 06/10/2013  Reason for Admission:  Depression with suicidal ideations  Discharge Diagnoses: Active Problems:   MDD (major depressive disorder), recurrent severe, without psychosis  Review of Systems  Constitutional: Negative.   HENT: Negative.   Eyes: Negative.   Respiratory: Negative.   Cardiovascular: Negative.   Gastrointestinal: Negative.   Genitourinary: Negative.   Musculoskeletal: Negative.   Skin: Negative.   Neurological: Negative.   Endo/Heme/Allergies: Negative.     DSM5:  Depressive Disorders:  Major Depressive Disorder - Severe (296.23)  Axis Diagnosis:   AXIS I:  Anxiety Disorder NOS and Major Depression, Recurrent severe AXIS II:  Deferred AXIS III:   Past Medical History  Diagnosis Date  . Depression   . HSV 06/28/2007  . Complication of anesthesia     epidural with last pregnancy made entire left side numb, had to d/c  . Headache    AXIS IV:  economic problems, other psychosocial or environmental problems, problems related to social environment and problems with primary support group AXIS V:  61-70 mild symptoms  Level of Care:  OP  Hospital Course:  On admission:  27 year old single AA female mother of 2 children ages 1 and 92, who presented to the ED at [facility] yesterday stating that she was loosing her temper, and crying in the bathroom after an event at work. She reported being suicidal and had plans to drive her car off a bridge, or to overdose and drink alcohol. She has had suicidal thoughts in the past and had acted on them via overdose at the age of 14.  Candice [name] reports a number of financial stressors including traffic tickets she can not afford to pay, giving up her apartment and moving  in with her children's father, and not being able to put food on the table for her kids. 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 She was upset when she asked for some assistance with food for her children from the same store and was asked to fill out a form.  She has never been admitted to the hospital for any psychiatric reasons, and has received treatment for depression previously at 57 when she was given Zoloft.   During hospitalization:  Medications managed--Hydrocortisone for her rash from her home medication list continued along with her Robaxin 750 mg every 6 hours PRN muscle spasms.  Zoloft daily for depression and Ambien 5 mg for insomnia started.  Candice [name] attended and participated in therapy.  Patient denied suicidal/homicidal ideations and auditory/visual hallucinations, follow-up appointments encouraged to attend, Rx given.  She is mentally and physically stable for discharge.  Consults:  None  Significant Diagnostic Studies:  labs: completed, reveiwed, stable  Discharge Vitals:   Blood pressure 119/89, pulse 111, temperature 98 F (36.7 C), temperature source Oral, resp. rate 18, height 5' 2.5" (1.588 m), weight 98.431 kg (217 lb), last menstrual period 05/18/2013. Body mass index is 39.03 kg/(m^2). Lab Results:   Results for orders placed during the hospital encounter of 06/08/13 (from the past 72 hour(s))  CBC WITH DIFFERENTIAL     Status: None   Collection Time  06/08/13 10:35 AM      Result Value Range   WBC 10.3  4.0 - 10.5 K/uL   RBC 4.69  3.87 - 5.11 MIL/uL   Hemoglobin 12.3  12.0 - 15.0 g/dL   HCT 45.8  09.9 - 83.3 %   MCV 81.9  78.0 - 100.0 fL   MCH 26.2  26.0 - 34.0 pg   MCHC 32.0  30.0 - 36.0 g/dL   RDW 82.5  05.3 - 97.6 %   Platelets 277  150 - 400 K/uL   Neutrophils Relative % 73  43 - 77 %   Neutro Abs 7.6  1.7 - 7.7 K/uL   Lymphocytes Relative  21  12 - 46 %   Lymphs Abs 2.2  0.7 - 4.0 K/uL   Monocytes Relative 5  3 - 12 %   Monocytes Absolute 0.5  0.1 - 1.0 K/uL   Eosinophils Relative 0  0 - 5 %   Eosinophils Absolute 0.0  0.0 - 0.7 K/uL   Basophils Relative 0  0 - 1 %   Basophils Absolute 0.0  0.0 - 0.1 K/uL  BASIC METABOLIC PANEL     Status: Abnormal   Collection Time    06/08/13 10:35 AM      Result Value Range   Sodium 137  135 - 145 mEq/L   Potassium 3.4 (*) 3.5 - 5.1 mEq/L   Chloride 103  96 - 112 mEq/L   CO2 25  19 - 32 mEq/L   Glucose, Bld 99  70 - 99 mg/dL   BUN 9  6 - 23 mg/dL   Creatinine, Ser 7.34  0.50 - 1.10 mg/dL   Calcium 9.1  8.4 - 19.3 mg/dL   GFR calc non Af Amer >90  >90 mL/min   GFR calc Af Amer >90  >90 mL/min   Comment: (NOTE)     The eGFR has been calculated using the CKD EPI equation.     This calculation has not been validated in all clinical situations.     eGFR's persistently <90 mL/min signify possible Chronic Kidney     Disease.  ETHANOL     Status: None   Collection Time    06/08/13 10:35 AM      Result Value Range   Alcohol, Ethyl (B) <11  0 - 11 mg/dL   Comment:            LOWEST DETECTABLE LIMIT FOR     SERUM ALCOHOL IS 11 mg/dL     FOR MEDICAL PURPOSES ONLY  ACETAMINOPHEN LEVEL     Status: None   Collection Time    06/08/13 10:35 AM      Result Value Range   Acetaminophen (Tylenol), Serum <15.0  10 - 30 ug/mL   Comment:            THERAPEUTIC CONCENTRATIONS VARY     SIGNIFICANTLY. A RANGE OF 10-30     ug/mL MAY BE AN EFFECTIVE     CONCENTRATION FOR MANY PATIENTS.     HOWEVER, SOME ARE BEST TREATED     AT CONCENTRATIONS OUTSIDE THIS     RANGE.     ACETAMINOPHEN CONCENTRATIONS     >150 ug/mL AT 4 HOURS AFTER     INGESTION AND >50 ug/mL AT 12     HOURS AFTER INGESTION ARE     OFTEN ASSOCIATED WITH TOXIC     REACTIONS.  SALICYLATE LEVEL     Status: Abnormal   Collection  Time    06/08/13 10:35 AM      Result Value Range   Salicylate Lvl <2.0 (*) 2.8 - 20.0 mg/dL   URINALYSIS, ROUTINE W REFLEX MICROSCOPIC     Status: Abnormal   Collection Time    06/08/13 10:54 AM      Result Value Range   Color, Urine AMBER (*) YELLOW   Comment: BIOCHEMICALS MAY BE AFFECTED BY COLOR   APPearance CLEAR  CLEAR   Specific Gravity, Urine 1.034 (*) 1.005 - 1.030   pH 7.0  5.0 - 8.0   Glucose, UA NEGATIVE  NEGATIVE mg/dL   Hgb urine dipstick NEGATIVE  NEGATIVE   Bilirubin Urine SMALL (*) NEGATIVE   Ketones, ur 15 (*) NEGATIVE mg/dL   Protein, ur NEGATIVE  NEGATIVE mg/dL   Urobilinogen, UA 1.0  0.0 - 1.0 mg/dL   Nitrite NEGATIVE  NEGATIVE   Leukocytes, UA NEGATIVE  NEGATIVE   Comment: MICROSCOPIC NOT DONE ON URINES WITH NEGATIVE PROTEIN, BLOOD, LEUKOCYTES, NITRITE, OR GLUCOSE <1000 mg/dL.  PREGNANCY, URINE     Status: None   Collection Time    06/08/13 10:54 AM      Result Value Range   Preg Test, Ur NEGATIVE  NEGATIVE   Comment:            THE SENSITIVITY OF THIS     METHODOLOGY IS >20 mIU/mL.  URINE RAPID DRUG SCREEN (HOSP PERFORMED)     Status: Abnormal   Collection Time    06/08/13 10:54 AM      Result Value Range   Opiates NONE DETECTED  NONE DETECTED   Cocaine NONE DETECTED  NONE DETECTED   Benzodiazepines NONE DETECTED  NONE DETECTED   Amphetamines NONE DETECTED  NONE DETECTED   Tetrahydrocannabinol POSITIVE (*) NONE DETECTED   Barbiturates NONE DETECTED  NONE DETECTED   Comment:            DRUG SCREEN FOR MEDICAL PURPOSES     ONLY.  IF CONFIRMATION IS NEEDED     FOR ANY PURPOSE, NOTIFY LAB     WITHIN 5 DAYS.                LOWEST DETECTABLE LIMITS     FOR URINE DRUG SCREEN     Drug Class       Cutoff (ng/mL)     Amphetamine      1000     Barbiturate      200     Benzodiazepine   200     Tricyclics       300     Opiates          300     Cocaine          300     THC              50    Physical Findings: AIMS: Facial and Oral Movements Muscles of Facial Expression: None, normal Lips and Perioral Area: None, normal Jaw: None,  normal Tongue: None, normal,Extremity Movements Upper (arms, wrists, hands, fingers): None, normal Lower (legs, knees, ankles, toes): None, normal, Trunk Movements Neck, shoulders, hips: None, normal, Overall Severity Severity of abnormal movements (highest score from questions above): None, normal Incapacitation due to abnormal movements: None, normal Patient's awareness of abnormal movements (rate only patient's report): No Awareness, Dental Status Current problems with teeth and/or dentures?: No Does patient usually wear dentures?: No  CIWA:    COWS:     Psychiatric Specialty  Exam: See Psychiatric Specialty Exam and Suicide Risk Assessment completed by Attending Physician prior to discharge.  Discharge destination:  Home  Is patient on multiple antipsychotic therapies at discharge:  No   Has Patient had three or more failed trials of antipsychotic monotherapy by history:  No  Recommended Plan for Multiple Antipsychotic Therapies: NA  Discharge Orders   Future Appointments Provider Department Dept Phone   06/29/2013 11:00 AM Forde Radon, [facility] [place] [phone]   Future Orders Complete By Expires   Activity as tolerated - No restrictions  As directed    Diet - low sodium heart healthy  As directed        Medication List       Indication   HYDROCORTISONE ACETATE EX  Apply 1 application topically daily as needed (rash).      methocarbamol 750 MG tablet  Commonly known as:  ROBAXIN  Take 750 mg by mouth every 6 (six) hours as needed for muscle spasms.      sertraline 50 MG tablet  Commonly known as:  ZOLOFT  Take 1 tablet (50 mg total) by mouth daily.   Indication:  Anxiety Disorder, Major Depressive Disorder     sertraline 100 MG tablet  Commonly known as:  ZOLOFT  Take 1 tablet (100 mg total) by mouth daily.  Start taking on:  06/12/2013   Indication:  Anxiety Disorder, Major Depressive Disorder     zolpidem 5 MG tablet   Commonly known as:  AMBIEN  Take 1 tablet (5 mg total) by mouth at bedtime as needed for sleep.   Indication:  Trouble Sleeping           Follow-up Information   Follow up with [facility] On 06/29/2013. (You are scheduled with [facility] on 11 AM.  Please arrive at 10:30 to complete registration)    Contact information:   [address] [place], [place]   [address]  [phone]      Follow up with Dr. Lolly Mustache  - Behavioral Health Outpatient Clinic On 07/19/2013. (You are scheduled with Dr. Lolly Mustache on Tuesday, July 07, 2013 at [facility])    Contact information:   [address] [place], [place]    [address]  [phone]      Follow-up recommendations:  Activity:  as tolerated Diet:  low-sodium heart healthy diet  Comments:  Patient will continue her care with Dr Lolly Mustache in outpatient therapy at [facility].  Total Discharge Time:  Greater than 30 minutes.  SignedNanine Means, PMH-NP 06/10/2013, 11:38 AM  Patient was seen for psychiatric evaluation, suicide risk assessment and case discussed with the treatment team. Disposition plans were made when he progressed in his treatment and reviewed the information documented and agree with the treatment plan.  Jaslyn Bansal,JANARDHAHA R. 06/10/2013 11:57 AM

## 2013-06-10 NOTE — BHH Group Notes (Signed)
[facility] LCSW Aftercare Discharge Planning Group Note   06/10/2013 10:58 AM    Participation Quality:  Appropraite  Mood/Affect:  Appropriate  Depression Rating:  4  Anxiety Rating:  5/6  Thoughts of Suicide:  No  Will you contract for safety?   NA  Current AVH:  No  Plan for Discharge/Comments:  Patient attended discharge planning group and actively participated in group.  She advised of being a little anxious about discharging home today but wants to discharge. CSW provided all participants with daily workbook.  She was also given information on [facility].  Patient shared she plans to follow up with them this afternoon.  Transportation Means: Patient has transportation.   Supports:  Patient has a support system.   Candice [name], Candice [name]

## 2013-06-10 NOTE — Progress Notes (Signed)
[facility] Adult Case Management Discharge Plan :  Will you be returning to the same living situation after discharge: Yes,  Patient is returning to her home. At discharge, do you have transportation home?:Yes,  Patient to arrange transportation. Do you have the ability to pay for your medications:Yes,  Patient can obtain medications.  Release of information consent forms completed and in the chart;  Patient's signature needed at discharge.  Patient to Follow up at: Follow-up Information   Follow up with [facility] On 06/29/2013. (You are scheduled with [facility] Nay on 11 AM.  Please arrive at 10:30 to complete registration)    Contact information:   [address] [place], [place]   [address]  [phone]      Follow up with Dr. Lolly Mustache  - Behavioral Health Outpatient Clinic On 07/19/2013. (You are scheduled with Dr. Lolly Mustache on Tuesday, July 07, 2013 at [facility])    Contact information:   [address] [place], [place]    [address]  [phone]      Patient denies SI/HI:  Patient no longer endorsing SI/HI or other thoughts of self harm.   Safety Planning and Suicide Prevention discussed:  .Reviewed with all patients during discharge planning group   Wilder Kurowski, Joesph July 06/10/2013, 10:48 AM

## 2013-06-10 NOTE — BHH Suicide Risk Assessment (Signed)
[facility] INPATIENT:  Family/Significant Other Suicide Prevention Education  Suicide Prevention Education:  Education Completed, Rozetta Nunnery, [phone]; has been identified by the patient as the family member/significant other with whom the patient will be residing, and identified as the person(s) who will aid the patient in the event of a mental health crisis (suicidal ideations/suicide attempt).  With written consent from the patient, the family member/significant other has been provided the following suicide prevention education, prior to the and/or following the discharge of the patient.  The suicide prevention education provided includes the following:  Suicide risk factors  Suicide prevention and interventions  National Suicide Hotline telephone number  [facility] assessment telephone number  [facility] Emergency Assistance 911  [facility] and/or Residential Mobile Crisis Unit telephone number  Request made of family/significant other to:  Remove weapons (e.g., guns, rifles, knives), all items previously/currently identified as safety concern.  Sister advised patient does not have access to guns.  Remove drugs/medications (over-the-counter, prescriptions, illicit drugs), all items previously/currently identified as a safety concern.  The family member/significant other verbalizes understanding of the suicide prevention education information provided.  The family member/significant other agrees to remove the items of safety concern listed above.  Khamil Lamica Hairston 06/10/2013, 1:12 PM

## 2013-06-10 NOTE — Progress Notes (Signed)
Pt discharged per MD orders; pt currently denies SI/HI and auditory/visual hallucinations; pt was given education by RN regarding follow-up appointments and medications and pt denied any questions or concerns about these instructions; pt was then escorted to search room to retrieve her belongings by RN before being discharged to hospital lobby. 

## 2013-06-10 NOTE — Tx Team (Addendum)
Interdisciplinary Treatment Plan Update   Date Reviewed:  06/10/2013  Time Reviewed:  9:55 AM  Progress in Treatment:   Attending groups: Yes Participating in groups: Yes Taking medication as prescribed: Yes  Tolerating medication: Yes Family/Significant other contact made: Yes, contact made with sister.  Patient understands diagnosis: Yes  Discussing patient identified problems/goals with staff: Yes Medical problems stabilized or resolved: Yes Denies suicidal/homicidal ideation: Yes Patient has not harmed self or others: Yes  For review of initial/current patient goals, please see plan of care.  Estimated Length of Stay:  Discharge today  Reasons for Continued Hospitalization:    New Problems/Goals identified:  Patient requesting discharge due to concerns children's father not taking appropriate care of them.  Discharge Plan or Barriers:   Home with outpatient follow up with [facility]  Additional Comments:   N/A   Attendees:  Patient:  Candice [name] 06/10/2013 9:55 AM   Signature: Mervyn Gay, MD 06/10/2013 9:55 AM  Signature:  Verne Spurr, PA 06/10/2013 9:55 AM  Signature:  06/10/2013 9:55 AM  Signature: Nestor Ramp, RN 06/10/2013 9:55 AM  Signature:   06/10/2013 9:55 AM  Signature:  Juline Patch, LCSW 06/10/2013 9:55 AM  Signature:  Reyes Ivan, LCSW 06/10/2013 9:55 AM  Signature:   06/10/2013 9:55 AM  Signature:   06/10/2013 9:55 AM  Signature: Leighton Parody, RN 06/10/2013  9:55 AM  Signature:   Onnie Boer, RN [facility] 06/10/2013  9:55 AM  Signature:  Elizbeth Squires, Team Lead [place] 06/10/2013  9:55 AM    Scribe for Treatment Team:   Juline Patch,  06/10/2013 9:55 AM

## 2013-06-13 NOTE — Progress Notes (Signed)
Patient Discharge Instructions:  Next Level Care Provider Has Access to the EMR, 06/13/13 Records provided to [facility] via [facility]/Epic access.  Jerelene Redden, 06/13/2013, 3:24 PM

## 2013-06-14 ENCOUNTER — Telehealth: Payer: Self-pay | Admitting: Family Medicine

## 2013-06-14 NOTE — Telephone Encounter (Signed)
She can try Benadryl (which may make her sleepy) or something like Claritin or Zyrtec if she thinks the Zoloft is causing her to break out. It may get better after she's been on the medication for a while longer, or the medication may be causing it. If she continues to have trouble with it and nothing seems to help it, she can taper the Zoloft off but she should discuss this with her psychiatry providers (she was just discharged from [facility]). Their number is [phone].  She has follow-up scheduled for psychiatry follow up with Dr. Lolly Mustache at the [facility] on Tuesday, July 07, 2013 at 9 AM. She also has an ?outpatient Behavioral Health appointment January 7th at 11 AM (this may be for therapy or a hospital follow-up visit, I'm not sure). If she needs to be seen sooner, she should call the number above to see if they can see her more quickly. If she needs to be seen for a medical reason, she can schedule or walk-in to our clinic at any time. I can't do anything to help her be seen sooner by one of the psychiatry providers.  Thanks. --CMS

## 2013-06-14 NOTE — Telephone Encounter (Signed)
Related message,patient voiced understanding. Bisma Klett S  

## 2013-06-14 NOTE — Telephone Encounter (Signed)
Was given Zoloft but it is breaking her out. Also needs to know when to schedule followup appt Please advise

## 2013-06-22 ENCOUNTER — Other Ambulatory Visit: Payer: Self-pay | Admitting: Family Medicine

## 2013-06-22 NOTE — Telephone Encounter (Signed)
Pt called and needs a refill on her Valtrex called in to her pharmacy. jw

## 2013-06-22 NOTE — Telephone Encounter (Signed)
Medication sent in via e-script. She can follow up as needed. Thanks! --CMS

## 2013-06-29 ENCOUNTER — Encounter (INDEPENDENT_AMBULATORY_CARE_PROVIDER_SITE_OTHER): Payer: Self-pay

## 2013-06-29 ENCOUNTER — Ambulatory Visit (INDEPENDENT_AMBULATORY_CARE_PROVIDER_SITE_OTHER): Payer: BC Managed Care – PPO | Admitting: Psychology

## 2013-06-29 ENCOUNTER — Encounter (HOSPITAL_COMMUNITY): Payer: Self-pay | Admitting: Psychology

## 2013-06-29 DIAGNOSIS — F331 Major depressive disorder, recurrent, moderate: Secondary | ICD-10-CM
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
## 2013-07-07 ENCOUNTER — Encounter (HOSPITAL_COMMUNITY): Payer: Self-pay | Admitting: Emergency Medicine

## 2013-07-07 ENCOUNTER — Emergency Department (HOSPITAL_COMMUNITY)
Admission: EM | Admit: 2013-07-07 | Discharge: 2013-07-07 | Disposition: A | Discharge status: Home / Self Care | Payer: Self-pay | Attending: Emergency Medicine | Admitting: Emergency Medicine

## 2013-07-07 DIAGNOSIS — F3289 Other specified depressive episodes: Secondary | ICD-10-CM | POA: Insufficient documentation

## 2013-07-07 DIAGNOSIS — L738 Other specified follicular disorders: Secondary | ICD-10-CM | POA: Insufficient documentation

## 2013-07-07 DIAGNOSIS — Z79899 Other long term (current) drug therapy: Secondary | ICD-10-CM | POA: Insufficient documentation

## 2013-07-07 DIAGNOSIS — F17200 Nicotine dependence, unspecified, uncomplicated: Secondary | ICD-10-CM | POA: Insufficient documentation

## 2013-07-07 DIAGNOSIS — L739 Follicular disorder, unspecified: Secondary | ICD-10-CM

## 2013-07-07 DIAGNOSIS — F329 Major depressive disorder, single episode, unspecified: Secondary | ICD-10-CM | POA: Insufficient documentation

## 2013-07-07 DIAGNOSIS — R21 Rash and other nonspecific skin eruption: Secondary | ICD-10-CM | POA: Insufficient documentation

## 2013-07-07 MED ORDER — CHLORHEXIDINE GLUCONATE 4 % EX LIQD: Freq: Every day | CUTANEOUS | Status: DC | PRN

## 2013-07-07 MED ORDER — AZITHROMYCIN 250 MG PO TABS
1000.0000 mg | ORAL_TABLET | Freq: Once | ORAL | Status: AC
  Administered 2013-07-07: 1000 mg via ORAL
  Filled 2013-07-07: qty 4

## 2013-07-07 MED ORDER — CEPHALEXIN 500 MG PO CAPS: 500.0000 mg | ORAL_CAPSULE | Freq: Four times a day (QID) | ORAL | Status: DC

## 2013-07-07 MED ORDER — CEFTRIAXONE SODIUM 250 MG IJ SOLR
250.0000 mg | Freq: Once | INTRAMUSCULAR | Status: AC
  Administered 2013-07-07: 250 mg via INTRAMUSCULAR
  Filled 2013-07-07: qty 250

## 2013-07-07 NOTE — Progress Notes (Signed)
[facility] did not get to see patient but will be sending information about the [facility] Orange Card program, using the address provided.  °

## 2013-07-07 NOTE — ED Notes (Signed)
Pt c/o boils on her thighs, vaginal area and abd. They came up end of December after she started taking Zoloft.

## 2013-07-07 NOTE — ED Provider Notes (Signed)
CSN: [id number]     Arrival date & time 07/07/13  0919 History   First MD Initiated Contact with Patient 07/07/13 [phone]     Chief Complaint  Patient presents with  . boils    (Consider location/radiation/quality/duration/timing/severity/associated sxs/prior Treatment) HPI  This is a 28 year old female with a history of depression he was started on Zoloft one month ago who presents with multiple boils. Patient reports onset of symptoms one month ago when she started Zoloft. She is to followup with her primary Dr. and has had no success. She states "I'm just tired of it." She denies any fevers or systemic symptoms. She reports that she has multiple "boils" over her abdomen bilateral lower extremities, and vaginal area. She has been using sitz baths and smoking and alcohol and peroxide.  She denies any other complaints at this time.  Past Medical History  Diagnosis Date  . Depression   . HSV 06/28/2007  . Complication of anesthesia     epidural with last pregnancy made entire left side numb, had to d/c  . Headache   . Hx of migraines    Past Surgical History  Procedure Laterality Date  . Cesarean section    . Iud removal  2005    in cervix  . Cesarean section  09/07/2011    Procedure: CESAREAN SECTION;  Surgeon: Florian Buff, MD;  Location: [facility];  Service: Gynecology;  Laterality: N/A;  Repeat cesarean section with delivery of baby boy at 30. Apgars 9/9.  Bilateral tubal ligation.  . Tubal ligation     Family History  Problem Relation Age of Onset  . Alcohol abuse Mother   . Drug abuse Mother   . Depression Mother   . Alcohol abuse Father   . Drug abuse Father   . Mental illness Sister     bipolar ptsd  . Schizophrenia Sister   . Diabetes Maternal Grandmother   . Hypertension Maternal Grandmother   . Cancer Maternal Grandmother     ovarian, breast and lung  . Depression Maternal Grandmother    History  Substance Use Topics  . Smoking status: Current Every Day Smoker --  0.25 packs/day    Types: Cigarettes  . Smokeless tobacco: Never Used  . Alcohol Use: 0.0 oz/week     Comment: occasional   OB History   Grav Para Term Preterm Abortions TAB SAB Ect Mult Living   2 2 2  0 0 0 0 0 0 2     Review of Systems  Constitutional: Negative for fever.  Skin: Positive for rash.    Allergies  Review of patient's allergies indicates no known allergies.  Home Medications   Current Outpatient Rx  Name  Route  Sig  Dispense  Refill  . HYDROCORTISONE ACETATE EX   Apply externally   Apply 1 application topically daily as needed (rash).         Marland Kitchen ibuprofen (ADVIL,MOTRIN) 200 MG tablet   Oral   Take 400 mg by mouth every 6 (six) hours as needed for headache, mild pain or moderate pain.         Marland Kitchen sertraline (ZOLOFT) 100 MG tablet   Oral   Take 1 tablet (100 mg total) by mouth daily.   30 tablet   0   . valACYclovir (VALTREX) 500 MG tablet   Oral   Take 500 mg by mouth 2 (two) times daily. Take 1 tablet (500 mg today) by mouth twice a day for three days at  the first sign of an outbreak.         . cephALEXin (KEFLEX) 500 MG capsule   Oral   Take 1 capsule (500 mg total) by mouth 4 (four) times daily.   28 capsule   0   . chlorhexidine (HIBICLENS) 4 % external liquid   Topical   Apply topically daily as needed.   120 mL   0    BP 122/73  Pulse 85  Temp(Src) 98.7 F (37.1 C) (Oral)  Resp 20  SpO2 98%  LMP 05/18/2013 Physical Exam  Constitutional: She is oriented to person, place, and time. She appears well-developed and well-nourished.  HENT:  Head: Normocephalic and atraumatic.  Cardiovascular: Normal rate and regular rhythm.   Pulmonary/Chest: Effort normal. No respiratory distress.  Neurological: She is alert and oriented to person, place, and time.  Skin: Skin is warm.  Multiple pustules over the bilateral thighs, perineum, and abdomen. Pustules in multiple stages of healing. No significant erythema or induration.  Psychiatric:  She has a normal mood and affect.    ED Course  Procedures (including critical care time) Labs Review Labs Reviewed - No data to display Imaging Review No results found.  EKG Interpretation   None       MDM   1. Folliculitis    Patient presents with multiple pustule lesions over the lower extremities, lower abdomen, and perineum. She is nontoxic-appearing and vital signs are within normal limits. Exam is consistent with folliculitis. Unsure of whether this is related to recently starting Zoloft. There is no overt infection noted. Patient will be given Hibiclens and Keflex. She is to followup with her primary physician for further management.  After history, exam, and medical workup I feel the patient has been appropriately medically screened and is safe for discharge home. Pertinent diagnoses were discussed with the patient. Patient was given return precautions.     Merryl Hacker, MD 07/07/13 [phone]

## 2013-07-07 NOTE — ED Notes (Signed)
Pt walked by another pt's room that i was in saying she was felling better and she was leaving to go home now.

## 2013-07-07 NOTE — ED Notes (Signed)
Let pt wait for 10-15 mins to observe for reaction to antibiotics.

## 2013-07-07 NOTE — Discharge Instructions (Signed)
Folliculitis  Folliculitis is redness, soreness, and swelling (inflammation) of the hair follicles. This condition can occur anywhere on the body. People with weakened immune systems, diabetes, or obesity have a greater risk of getting folliculitis. CAUSES  Bacterial infection. This is the most common cause.  Fungal infection.  Viral infection.  Contact with certain chemicals, especially oils and tars. Long-term folliculitis can result from bacteria that live in the nostrils. The bacteria may trigger multiple outbreaks of folliculitis over time. SYMPTOMS Folliculitis most commonly occurs on the scalp, thighs, legs, back, buttocks, and areas where hair is shaved frequently. An early sign of folliculitis is a small, white or yellow, pus-filled, itchy lesion (pustule). These lesions appear on a red, inflamed follicle. They are usually less than 0.2 inches (5 mm) wide. When there is an infection of the follicle that goes deeper, it becomes a boil or furuncle. A group of closely packed boils creates a larger lesion (carbuncle). Carbuncles tend to occur in hairy, sweaty areas of the body. DIAGNOSIS  Your caregiver can usually tell what is wrong by doing a physical exam. A sample may be taken from one of the lesions and tested in a lab. This can help determine what is causing your folliculitis. TREATMENT  Treatment may include:  Applying warm compresses to the affected areas.  Taking antibiotic medicines orally or applying them to the skin.  Draining the lesions if they contain a large amount of pus or fluid.  Laser hair removal for cases of long-lasting folliculitis. This helps to prevent regrowth of the hair. HOME CARE INSTRUCTIONS  Apply warm compresses to the affected areas as directed by your caregiver.  If antibiotics are prescribed, take them as directed. Finish them even if you start to feel better.  You may take over-the-counter medicines to relieve itching.  Do not shave  irritated skin.  Follow up with your caregiver as directed. SEEK IMMEDIATE MEDICAL CARE IF:   You have increasing redness, swelling, or pain in the affected area.  You have a fever. MAKE SURE YOU:  Understand these instructions.  Will watch your condition.  Will get help right away if you are not doing well or get worse. Document Released: 08/18/2001 Document Revised: 12/09/2011 Document Reviewed: 09/09/2011 Exit[employer] Patient Information 2014 [employer].  

## 2013-07-13 ENCOUNTER — Ambulatory Visit (INDEPENDENT_AMBULATORY_CARE_PROVIDER_SITE_OTHER): Payer: Self-pay | Admitting: Psychology

## 2013-07-13 DIAGNOSIS — F331 Major depressive disorder, recurrent, moderate: Secondary | ICD-10-CM

## 2013-07-13 NOTE — Progress Notes (Signed)
   THERAPIST PROGRESS NOTE  Session Time: 12.35pm-1.20pm  Participation Level: Active  Behavioral Response: Well GroomedAlertEuthymic  Type of Therapy: Individual Therapy  Treatment Goals addressed: Diagnosis: MDD and goal 1.  Interventions: CBT  Summary: Candice [name] is a 28 y.o. female who presents with report of continued improved mood and only occasional sadness.  Pt reports feeling more focused and able to concentrate and prioritize as well as improved energy and not sleeping too much.  Pt reports awareness of not trying to make a relationship w/ father of her children and keeping just as friendship and this has helped w/ not worrying about his actions and choices.  Pt discussed positive steps taken to finding housing w/ her sister.  Pt increased insight in needing to have clear boundaries so doesn't take on problems of sister as her own or responsibility for.  Pt discussed guilt will feel at times and able to reframe in session and recognize what she needs for her own mental health and wellness.   Suicidal/Homicidal: Nowithout intent/plan  Therapist Response: Assessed pt current functioning per pt report.  Processed w/pt report of continued improved mood and assisted in reflecting pt strengths and change in behaviors and thought patterns.  Explored w/ pt stressors and difference in being support to someone or "taking on" their problems.  Encouraged healthy boundaries.    Plan: Return again in 2 weeks.  Diagnosis: Axis I: MDD    Axis II: No diagnosis    Leeta Grimme, LPC 07/13/2013

## 2013-07-19 ENCOUNTER — Ambulatory Visit (INDEPENDENT_AMBULATORY_CARE_PROVIDER_SITE_OTHER): Payer: Self-pay | Admitting: Psychiatry

## 2013-07-19 ENCOUNTER — Encounter (HOSPITAL_COMMUNITY): Payer: Self-pay | Admitting: Psychiatry

## 2013-07-19 VITALS — BP 121/78 | HR 68 | Ht 63.0 in | Wt 214.0 lb

## 2013-07-19 DIAGNOSIS — F332 Major depressive disorder, recurrent severe without psychotic features: Secondary | ICD-10-CM
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
## 2013-07-28 ENCOUNTER — Ambulatory Visit (INDEPENDENT_AMBULATORY_CARE_PROVIDER_SITE_OTHER): Payer: Self-pay | Admitting: Family Medicine

## 2013-07-28 ENCOUNTER — Encounter: Payer: Self-pay | Admitting: Family Medicine

## 2013-07-28 VITALS — BP 120/80 | Temp 99.2°F | Ht 63.0 in | Wt 211.6 lb

## 2013-07-28 DIAGNOSIS — F331 Major depressive disorder, recurrent, moderate: Secondary | ICD-10-CM

## 2013-07-28 DIAGNOSIS — L678 Other hair color and hair shaft abnormalities: Secondary | ICD-10-CM

## 2013-07-28 DIAGNOSIS — B373 Candidiasis of vulva and vagina: Secondary | ICD-10-CM

## 2013-07-28 DIAGNOSIS — N760 Acute vaginitis: Secondary | ICD-10-CM

## 2013-07-28 DIAGNOSIS — B3731 Acute candidiasis of vulva and vagina: Secondary | ICD-10-CM

## 2013-07-28 DIAGNOSIS — L739 Follicular disorder, unspecified: Secondary | ICD-10-CM

## 2013-07-28 DIAGNOSIS — B9689 Other specified bacterial agents as the cause of diseases classified elsewhere: Secondary | ICD-10-CM

## 2013-07-28 DIAGNOSIS — A499 Bacterial infection, unspecified: Secondary | ICD-10-CM

## 2013-07-28 DIAGNOSIS — L738 Other specified follicular disorders: Secondary | ICD-10-CM

## 2013-07-28 MED ORDER — FLUCONAZOLE 150 MG PO TABS: 150.0000 mg | ORAL_TABLET | Freq: Every day | ORAL | Status: DC

## 2013-07-28 MED ORDER — CLINDAMYCIN HCL 300 MG PO CAPS
300.0000 mg | ORAL_CAPSULE | Freq: Three times a day (TID) | ORAL | Status: DC
Start: 2013-07-28 — End: 2013-08-08

## 2013-07-28 MED ORDER — CHLORHEXIDINE GLUCONATE 4 % EX LIQD: Freq: Every day | CUTANEOUS | Status: DC | PRN

## 2013-07-28 MED ORDER — METRONIDAZOLE 500 MG PO TABS: 500.0000 mg | ORAL_TABLET | Freq: Three times a day (TID) | ORAL | Status: DC

## 2013-07-28 MED ORDER — MUPIROCIN 2 % EX OINT: 1.0000 "application " | TOPICAL_OINTMENT | Freq: Two times a day (BID) | CUTANEOUS | Status: DC

## 2013-07-28 NOTE — Assessment & Plan Note (Signed)
Recurrent, relatively mild / self-limited, but diffuse. Lesion to left thigh unroofed and 'drained,' in clinic per exam description and facial lesion too early for I&D. Uncertain why pt has had so much issue with similar lesions, but strongly doubt reaction to Zoloft. Rx for clindamycin to cover any systemic infection, refilled chlorhexidine body wash, and gave Rx for mupirocin for nasal eradication protocol. Plan to f/u as needed, especially if lesions continue or if they get worse or develop into frank cellulitis in any locations. See also other problem list notes.

## 2013-07-28 NOTE — Assessment & Plan Note (Signed)
No wet prep done today, but exam findings and history consistent with vaginitis +/- BV, especially in setting of antibiotic use and recent renewal of sexual activity. Rx for Diflucan 150 mg for three days. F/u as needed.

## 2013-07-28 NOTE — Assessment & Plan Note (Signed)
No wet prep done today, but exam findings and history consistent with vaginitis +/- BV, especially in setting of antibiotic use. Rx for metronidazole for 7 days. F/u as needed.

## 2013-07-28 NOTE — Progress Notes (Signed)
   Subjective:    Patient ID: Candice [name], female    DOB: Mar 20, 1986, 28 y.o.   MRN: [id number]  HPI: Pt presents to clinic with several issues, related by pt to concern for adverse reaction to Zoloft; complaints include multiple skin lesions, ?withdrawal from Zoloft, and vaginal itching / dryness. - pt was hospitalized in Dec for suicidal ideation and started on Zoloft with initial good improvement of depression / SI, chronic back pain, sleep, etc - pt developed some skin "rash" (several small boil / abscess-like lesions) and was diagnosed with folliculitis in the ED - pt was given azithromycin and Rocephin in the ED and discharged with Keflex and chlorhexidine body wash, which she completed - pt followed up with psychiatry and was told to hold Zoloft, though MD doubted true adverse reaction - pt has had at least two other lesions come up consistent with boils, one to her face and one to her right thigh, since stopping Zoloft - also has had some mood lability, headache, and mild dizziness / jitteriness and chills that she attributes to stopping Zoloft - also has had vaginal itching and dryness with some skin cracking and light bleeding secondary to scratching, for the last few days - overall, pt has not had relief with prescribed course of therapies, so far  Of note, pt does describe recent resumption of sexual activity with an old / known partner, with last intercourse 5 days ago. Pt not concerned for STI, at this point.  Review of Systems: As above. Otherwise denies fever, N/V, diarrhea or constipation, or abdominal pain. Denies frank vaginal discharge.     Objective:   Physical Exam BP 120/80  Temp(Src) 99.2 F (37.3 C) (Oral)  Ht 5\' 3"  (1.6 m)  Wt 211 lb 9.6 oz (95.981 kg)  BMI 37.49 kg/m2  LMP 07/21/2013  Breastfeeding? No Gen: well-appearing adult female in NAD HEENT: [place]/AT, EOMI, MMM  Left jaw with 1 cm round, red, tender area of induration WITHOUT fluctuance (see other  skin lesion below) Skin: diffuse, dark / discolored patches of skin, mostly to LE and trunk, healing / scarring in areas of previous "boils"  Single 1-2 cm diameter lesion to anterior left thigh with induration and surrounding erythema and pus-filled blister centrally  Blister unroofed with simple pressure applied to edges of induration, with approximately 3 mL of thick pus drained / expressed  Lesion much less tender and without active drainage / bleeding after pus expressed  Overall leg lesion similar in appearance to face lesion, but face lesion has no blister or fluctuance (?earlier stage than leg lesion) GU: no external vulvar / vaginal lesions  Speculum exam: moderate amount of thick, whitish discharge in vaginal vault and mucousy cervical discharge   Cervix nonfriable without ulcerations or bleeding  Bimanual exam: no CMT, no fundal tenderness, no adnexal tenderness or masses Abd: obese, soft, nontender, BS+ Ext: warm, well-perfused; skin lesions as above; otherwise no LE edema     Assessment & Plan:  See problem list notes.

## 2013-07-28 NOTE — Patient Instructions (Signed)
Thank you for coming in, today!  For your skin issues, I'm prescribing a few medications. Take clindamycin by mouth three times a day for 5 days. Use mupirocin ointment in your nose (both nostrils) twice a day for 7 days. Bathe with chlorhexidine soap daily until the soap is gone.  For your vaginal complaints, I want to treat you for yeast and BV. You should take Diflucan 150 mg once a day for three days. You should also take metronidazole 500 mg three times a day for 7 days.  Make sure you follow up with your psychiatrist. Stay off the Zoloft until he restarts you on it. Come back to see me as needed. Please feel free to call with any questions or concerns at any time, at [phone]. --Dr. Venetia Maxon

## 2013-07-28 NOTE — Assessment & Plan Note (Signed)
Following closely with psychiatry. Some symptoms suggestive of mild withdrawal symptoms, since stopping Zoloft per psychiatrist instructions. Discussed with pt and decided she should stay off Zoloft until she follows back up with psychiatry, though she probably can restart SSRI without any problems (skin lesions likely not related to Zoloft, at this point). Defer other therapies / considerations to psychiatrist. Reviewed red flags for worsening symptoms and instructions on when to call for further assistance.

## 2013-07-29 ENCOUNTER — Ambulatory Visit (HOSPITAL_COMMUNITY): Payer: Self-pay | Admitting: Psychology

## 2013-08-08 ENCOUNTER — Ambulatory Visit (INDEPENDENT_AMBULATORY_CARE_PROVIDER_SITE_OTHER): Payer: Self-pay | Admitting: Psychiatry

## 2013-08-08 ENCOUNTER — Encounter (HOSPITAL_COMMUNITY): Payer: Self-pay | Admitting: Psychiatry

## 2013-08-08 VITALS — BP 138/85 | HR 89 | Ht 63.0 in | Wt 211.8 lb

## 2013-08-08 DIAGNOSIS — F339 Major depressive disorder, recurrent, unspecified: Secondary | ICD-10-CM

## 2013-08-08 DIAGNOSIS — F1210 Cannabis abuse, uncomplicated: Secondary | ICD-10-CM

## 2013-08-08 DIAGNOSIS — F3289 Other specified depressive episodes: Secondary | ICD-10-CM

## 2013-08-08 DIAGNOSIS — F329 Major depressive disorder, single episode, unspecified: Secondary | ICD-10-CM

## 2013-08-08 MED ORDER — ESCITALOPRAM OXALATE 10 MG PO TABS: ORAL_TABLET | ORAL | Status: DC
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
## 2013-08-09 ENCOUNTER — Ambulatory Visit (HOSPITAL_COMMUNITY): Payer: Self-pay | Admitting: Psychiatry

## 2013-08-26 ENCOUNTER — Ambulatory Visit (INDEPENDENT_AMBULATORY_CARE_PROVIDER_SITE_OTHER): Payer: BC Managed Care – PPO | Admitting: Psychology

## 2013-08-26 DIAGNOSIS — F331 Major depressive disorder, recurrent, moderate: Secondary | ICD-10-CM

## 2013-08-26 NOTE — Progress Notes (Signed)
   THERAPIST PROGRESS NOTE  Session Time: 9.05am-9:55am  Participation Level: Active  Behavioral Response: Well GroomedAlertDepressed  Type of Therapy: Individual Therapy  Treatment Goals addressed: Diagnosis: MDD and goal 1.   Interventions: CBT and Strength-based  Summary: MANREET KIERNAN is a 28 y.o. female who presents with report of increased depressed mood, loss of motivation, fatigue and loss of interest.  Pt reported that no rash on current medication but doesn't feel that lexapro is working as well as zoloft.  Pt discussed that she is aware of increased depression so focusing on keeping engaged.  Pt reports that she is able to some days but gets distracted by own thoughts.  Pt discussed self care w/ recent pedicure and accidentally not having her phone w/ her and the benefit of this. Pt agreed to continue this self care, give moments of being unplugged and use of mindfulness. Pt discussed worries about how things w/ transition w/ children's father when they move out in the future.  Pt agreed to express and communicate what she wants and clarify this w/ him.   Suicidal/Homicidal: Nowithout intent/plan  Therapist Response: Assessed pt current functioning per pt report.  Explored w/ pt increased depressed moods and reflected strengths of awareness and self care.  Discussed positive impact of self care and planning for more.  Introduced to mindfulness and how to keep connected to present and not in negative thoughts.  Discussed how to implement.  Processed w/pt relationship w/ children's father and need for communication re: their relationship.   Plan: Return again in 2 weeks and continue w/ Dr. Adele Schilder.  Diagnosis: Axis I: MDD    Axis II: No diagnosis    YATES,LEANNE, LPC 08/26/2013

## 2013-09-05 ENCOUNTER — Ambulatory Visit (INDEPENDENT_AMBULATORY_CARE_PROVIDER_SITE_OTHER): Payer: BC Managed Care – PPO | Admitting: Psychiatry

## 2013-09-05 ENCOUNTER — Encounter (HOSPITAL_COMMUNITY): Payer: Self-pay | Admitting: Psychiatry

## 2013-09-05 VITALS — BP 114/78 | HR 76 | Ht 62.0 in | Wt 209.2 lb

## 2013-09-05 DIAGNOSIS — F332 Major depressive disorder, recurrent severe without psychotic features: Secondary | ICD-10-CM

## 2013-09-05 DIAGNOSIS — F1210 Cannabis abuse, uncomplicated: Secondary | ICD-10-CM

## 2013-09-05 DIAGNOSIS — F339 Major depressive disorder, recurrent, unspecified: Secondary | ICD-10-CM

## 2013-09-05 MED ORDER — BUPROPION HCL ER (XL) 150 MG PO TB24: 150.0000 mg | ORAL_TABLET | ORAL | Status: DC
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
## 2013-09-24 ENCOUNTER — Other Ambulatory Visit (HOSPITAL_COMMUNITY)
Admission: RE | Admit: 2013-09-24 | Discharge: 2013-09-24 | Disposition: A | Discharge status: Home / Self Care | Payer: BC Managed Care – PPO | Source: Ambulatory Visit | Attending: Family Medicine | Admitting: Family Medicine

## 2013-09-24 ENCOUNTER — Emergency Department (HOSPITAL_COMMUNITY)
Admission: EM | Admit: 2013-09-24 | Discharge: 2013-09-24 | Disposition: A | Discharge status: Home / Self Care | Payer: BC Managed Care – PPO | Source: Home / Self Care | Attending: Family Medicine | Admitting: Family Medicine

## 2013-09-24 ENCOUNTER — Encounter (HOSPITAL_COMMUNITY): Payer: Self-pay | Admitting: Emergency Medicine

## 2013-09-24 DIAGNOSIS — N764 Abscess of vulva: Secondary | ICD-10-CM

## 2013-09-24 DIAGNOSIS — N72 Inflammatory disease of cervix uteri: Secondary | ICD-10-CM

## 2013-09-24 DIAGNOSIS — Z113 Encounter for screening for infections with a predominantly sexual mode of transmission: Secondary | ICD-10-CM | POA: Insufficient documentation

## 2013-09-24 DIAGNOSIS — N760 Acute vaginitis: Secondary | ICD-10-CM | POA: Insufficient documentation

## 2013-09-24 LAB — HIV ANTIBODY (ROUTINE TESTING W REFLEX): HIV: NONREACTIVE

## 2013-09-24 LAB — POCT URINALYSIS DIP (DEVICE)
Bilirubin Urine: NEGATIVE
Glucose, UA: NEGATIVE mg/dL
HGB URINE DIPSTICK: NEGATIVE
Ketones, ur: NEGATIVE mg/dL
Leukocytes, UA: NEGATIVE
Nitrite: NEGATIVE
PH: 7 (ref 5.0–8.0)
Protein, ur: NEGATIVE mg/dL
Specific Gravity, Urine: 1.02 (ref 1.005–1.030)
UROBILINOGEN UA: 0.2 mg/dL (ref 0.0–1.0)

## 2013-09-24 LAB — RPR: RPR Ser Ql: NONREACTIVE

## 2013-09-24 LAB — POCT PREGNANCY, URINE: Preg Test, Ur: NEGATIVE

## 2013-09-24 MED ORDER — CEFTRIAXONE SODIUM 250 MG IJ SOLR
INTRAMUSCULAR | Status: AC
  Filled 2013-09-24: qty 250

## 2013-09-24 MED ORDER — LIDOCAINE HCL (PF) 1 % IJ SOLN
INTRAMUSCULAR | Status: AC
Start: 2013-09-24 — End: 2013-09-24
  Filled 2013-09-24: qty 5

## 2013-09-24 MED ORDER — CEFTRIAXONE SODIUM 250 MG IJ SOLR
250.0000 mg | Freq: Once | INTRAMUSCULAR | Status: AC
  Administered 2013-09-24: 250 mg via INTRAMUSCULAR

## 2013-09-24 MED ORDER — AZITHROMYCIN 250 MG PO TABS
ORAL_TABLET | ORAL | Status: AC
  Filled 2013-09-24: qty 4

## 2013-09-24 MED ORDER — AZITHROMYCIN 250 MG PO TABS
1000.0000 mg | ORAL_TABLET | Freq: Every day | ORAL | Status: DC
  Administered 2013-09-24: 1000 mg via ORAL

## 2013-09-24 NOTE — ED Provider Notes (Signed)
CSN: [id number]     Arrival date & time 09/24/13  3244 History   None    Chief Complaint  Patient presents with  . Vaginal Discharge   (Consider location/radiation/quality/duration/timing/severity/associated sxs/prior Treatment) HPI Comments: 27 year old female presents complaining of lower abdominal and pelvic discomfort, vaginal discharge, right flank pain, vaginal itching, as well as a labial abscess on the right side that is getting worse for the past 3 days. She also admits to some dysuria. She feels similar to how she felt in the past when she had pelvic inflammatory disease, but what she has today is not as bad as the PID. She  Admits to a risk for STDs. Denies fever, chills, NVD. She has not noticed any odor to her vaginal discharge. She has had labial abscesses in the past that have drained on their own, however she has tried ibuprofen, warm soaks, she cannot get this to drain.  Patient is a 28 y.o. female presenting with vaginal discharge.  Vaginal Discharge Associated symptoms: abdominal pain and dysuria   Associated symptoms: no fever, no nausea and no vomiting     Past Medical History  Diagnosis Date  . Depression   . HSV 06/28/2007  . Complication of anesthesia     epidural with last pregnancy made entire left side numb, had to d/c  . Headache   . Hx of migraines    Past Surgical History  Procedure Laterality Date  . Cesarean section    . Iud removal  2005    in cervix  . Cesarean section  09/07/2011    Procedure: CESAREAN SECTION;  Surgeon: Florian Buff, MD;  Location: [facility];  Service: Gynecology;  Laterality: N/A;  Repeat cesarean section with delivery of baby boy at 3. Apgars 9/9.  Bilateral tubal ligation.  . Tubal ligation     Family History  Problem Relation Age of Onset  . Alcohol abuse Mother   . Drug abuse Mother   . Depression Mother   . Alcohol abuse Father   . Drug abuse Father   . Mental illness Sister     bipolar ptsd  . Schizophrenia Sister    . Diabetes Maternal Grandmother   . Hypertension Maternal Grandmother   . Cancer Maternal Grandmother     ovarian, breast and lung  . Depression Maternal Grandmother    History  Substance Use Topics  . Smoking status: Current Every Day Smoker -- 0.25 packs/day    Types: Cigarettes  . Smokeless tobacco: Never Used  . Alcohol Use: No     Comment: occasional   OB History   Grav Para Term Preterm Abortions TAB SAB Ect Mult Living   2 2 2  0 0 0 0 0 0 2     Review of Systems  Constitutional: Negative for fever.  Gastrointestinal: Positive for abdominal pain. Negative for nausea and vomiting.  Genitourinary: Positive for dysuria, flank pain, vaginal discharge, genital sores, vaginal pain and pelvic pain. Negative for urgency, hematuria and vaginal bleeding.  All other systems reviewed and are negative.    Allergies  Zoloft  Home Medications   Current Outpatient Rx  Name  Route  Sig  Dispense  Refill  . buPROPion (WELLBUTRIN XL) 150 MG 24 hr tablet   Oral   Take 1 tablet (150 mg total) by mouth every morning.   30 tablet   0   . ibuprofen (ADVIL,MOTRIN) 200 MG tablet   Oral   Take 400 mg by mouth every 6 (six)  hours as needed for headache, mild pain or moderate pain.         . valACYclovir (VALTREX) 500 MG tablet   Oral   Take 500 mg by mouth 2 (two) times daily. Take 1 tablet (500 mg today) by mouth twice a day for three days at the first sign of an outbreak.          BP 119/74  Pulse 65  Temp(Src) 98.7 F (37.1 C) (Oral)  Resp 18  SpO2 100%  LMP 09/10/2013 Physical Exam  Nursing note and vitals reviewed. Constitutional: She is oriented to person, place, and time. Vital signs are normal. She appears well-developed and well-nourished. No distress.  HENT:  Head: Normocephalic and atraumatic.  Pulmonary/Chest: Effort normal. No respiratory distress.  Abdominal: Soft. Normal appearance. There is no hepatosplenomegaly. There is tenderness in the suprapubic  area. There is no rigidity, no rebound, no guarding, no CVA tenderness, no tenderness at McBurney's point and negative Murphy's sign. No hernia.  Genitourinary: There is lesion (labia majora abscess at the 8:00 position) on the right labia. Cervix exhibits no motion tenderness, no discharge and no friability. Right adnexum displays no mass, no tenderness and no fullness. Left adnexum displays no mass, no tenderness and no fullness. No erythema or bleeding around the vagina. No signs of injury around the vagina. Vaginal discharge (thin, white, mucoid) found.  Lymphadenopathy:       Right: No inguinal adenopathy present.       Left: No inguinal adenopathy present.  Neurological: She is alert and oriented to person, place, and time. She has normal strength. Coordination normal.  Skin: Skin is warm and dry. No rash noted. She is not diaphoretic.  Psychiatric: She has a normal mood and affect. Judgment normal.    ED Course  INCISION AND DRAINAGE Date/Time: 09/24/2013 10:59 AM Performed by: Allena Katz, H Authorized by: Ihor Gully D Consent: Verbal consent obtained. Risks and benefits: risks, benefits and alternatives were discussed Consent given by: patient Patient identity confirmed: verbally with patient Time out: Immediately prior to procedure a "time out" was called to verify the correct patient, procedure, equipment, support staff and site/side marked as required. Type: abscess Body area: anogenital Location details: vulva Anesthesia: local infiltration Local anesthetic: lidocaine 2% with epinephrine Anesthetic total: 3 ml Scalpel size: 11 Incision type: single straight Complexity: simple Drainage: purulent Drainage amount: moderate Wound treatment: wound left open Packing material: none Patient tolerance: Patient tolerated the procedure well with no immediate complications.   (including critical care time) Labs Review Labs Reviewed  CULTURE, ROUTINE-ABSCESS  RPR  HIV  ANTIBODY (ROUTINE TESTING)  POCT URINALYSIS DIP (DEVICE)  POCT PREGNANCY, URINE  CERVICOVAGINAL ANCILLARY ONLY   Imaging Review No results found.   MDM   1. Labial abscess   2. Vaginitis   3. Cervicitis    Abscess incised and drained, abscess culture sent.  Warm soaks 3-4 times daily for treatment, no packing placed.  Treating for GC/CH, checking HIV and RPR as well.     Meds ordered this encounter  Medications  . azithromycin (ZITHROMAX) tablet 1,000 mg    Sig:   . cefTRIAXone (ROCEPHIN) injection 250 mg    Sig:      Liam Graham, PA-C 09/24/13 1101

## 2013-09-24 NOTE — ED Notes (Signed)
Call back number verified.

## 2013-09-24 NOTE — Discharge Instructions (Signed)
Cervicitis Cervicitis is a soreness and swelling (inflammation) of the cervix. Your cervix is located at the bottom of your uterus. It opens up to the vagina. CAUSES   Sexually transmitted infections (STIs).   Allergic reaction.   Medicines or birth control devices that are put in the vagina.   Injury to the cervix.   Bacterial infections.  RISK FACTORS You are at greater risk if you:  Have unprotected sexual intercourse.  Have sexual intercourse with many partners.  Began sexual intercourse at an early age.  Have a history of STIs. SYMPTOMS  There may be no symptoms. If symptoms occur, they may include:   Grey, white, yellow, or bad-smelling vaginal discharge.   Pain or itching of the area outside the vagina.   Painful sexual intercourse.   Lower abdominal or lower back pain, especially during intercourse.   Frequent urination.   Abnormal vaginal bleeding between periods, after sexual intercourse, or after menopause.   Pressure or a heavy feeling in the pelvis.  DIAGNOSIS  Diagnosis is made after a pelvic exam. Other tests may include:   Examination of any discharge under a microscope (wet prep).   A Pap test.  TREATMENT  Treatment will depend on the cause of cervicitis. If it is caused by an STI, both you and your partner will need to be treated. Antibiotic medicines will be given.  HOME CARE INSTRUCTIONS   Do not have sexual intercourse until your health care provider says it is okay.   Do not have sexual intercourse until your partner has been treated, if your cervicitis is caused by an STI.   Take your antibiotics as directed. Finish them even if you start to feel better.  SEEK MEDICAL CARE IF:  Your symptoms come back.   You have a fever.  MAKE SURE YOU:   Understand these instructions.  Will watch your condition.  Will get help right away if you are not doing well or get worse. Document Released: 06/09/2005 Document Revised:  02/09/2013 Document Reviewed: 12/01/2012 [facility] Patient Information 2014 [place].  Sexually Transmitted Disease A sexually transmitted disease (STD) is a disease or infection that may be passed (transmitted) from person to person, usually during sexual activity. This may happen by way of saliva, semen, blood, vaginal mucus, or urine. Common STDs include:   Gonorrhea.   Chlamydia.   Syphilis.   HIV and AIDS.   Genital herpes.   Hepatitis B and C.   Trichomonas.   Human papillomavirus (HPV).   Pubic lice.   Scabies.  Mites.  Bacterial vaginosis. WHAT ARE CAUSES OF STDs? An STD may be caused by bacteria, a virus, or parasites. STDs are often transmitted during sexual activity if one person is infected. However, they may also be transmitted through nonsexual means. STDs may be transmitted after:   Sexual intercourse with an infected person.   Sharing sex toys with an infected person.   Sharing needles with an infected person or using unclean piercing or tattoo needles.  Having intimate contact with the genitals, mouth, or rectal areas of an infected person.   Exposure to infected fluids during birth. WHAT ARE THE SIGNS AND SYMPTOMS OF STDs? Different STDs have different symptoms. Some people may not have any symptoms. If symptoms are present, they may include:   Painful or bloody urination.   Pain in the pelvis, abdomen, vagina, anus, throat, or eyes.   Skin rash, itching, irritation, growths, sores (lesions), ulcerations, or warts in the genital or anal  area.  Abnormal vaginal discharge with or without bad odor.   Penile discharge in men.   Fever.   Pain or bleeding during sexual intercourse.   Swollen glands in the groin area.   Yellow skin and eyes (jaundice). This is seen with hepatitis.   Swollen testicles.  Infertility.  Sores and blisters in the mouth. HOW ARE STDs DIAGNOSED? To make a diagnosis, your health care  provider may:   Take a medical history.   Perform a physical exam.   Take a sample of any discharge for examination.  Swab the throat, cervix, opening to the penis, rectum, or vagina for testing.  Test a sample of your first morning urine.   Perform blood tests.   Perform a Pap smear, if this applies.   Perform a colposcopy.   Perform a laparoscopy.  HOW ARE STDs TREATED? Treatment depends on the STD. Some STDs may be treated but not cured.   Chlamydia, gonorrhea, trichomonas, and syphilis can be cured with antibiotics.   Genital herpes, hepatitis, and HIV can be treated, but not cured, with prescribed medicines. The medicines lessen symptoms.   Genital warts from HPV can be treated with medicine or by freezing, burning (electrocautery), or surgery. Warts may come back.   HPV cannot be cured with medicine or surgery. However, abnormal areas may be removed from the cervix, vagina, or vulva.   If your diagnosis is confirmed, your recent sexual partners need treatment. This is true even if they are symptom-free or have a negative culture or evaluation. They should not have sex until their health care providers say it is OK. HOW CAN I REDUCE MY RISK OF GETTING AN STD?  Use latex condoms, dental dams, and water-soluble lubricants during sexual activity. Do not use petroleum jelly or oils.  Get vaccinated for HPV and hepatitis. If you have not received these vaccines in the past, talk to your health care provider about whether one or both might be right for you.   Avoid risky sex practices that can break the skin.  WHAT SHOULD I DO IF I THINK I HAVE AN STD?  See your health care provider.   Inform all sexual partners. They should be tested and treated for any STDs.  Do not have sex until your health care provider says it is OK. WHEN SHOULD I GET HELP? Seek immediate medical care if:  You develop severe abdominal pain.  You are a man and notice swelling or  pain in the testicles.  You are a woman and notice swelling or pain in your vagina. Document Released: 08/30/2002 Document Revised: 03/30/2013 Document Reviewed: 12/28/2012 [facility] Patient Information 2014 [place], [place].   Vulvar Abscess  The vulva is made up of the large and small flaps of skin around the vagina opening. A vulvar abscess is an infected sac of yellowish white fluid (pus) in the skin flaps. Your doctor may make a small cut in the skin to drain the vulvar abscess.  HOME CARE  Only take medicine as told by your doctor.  Soak or take a warm water bath (sitz bath) 3 to 4 times a day for 15 to 20 minutes.  After you pee (urinate), always wipe from front to back.  Clean the vulvar abscess with soap and warm water. Do this after going to the bathroom.  Wear loose-fitting clothing.  Do not have sex until the vulvar abscess is gone or as told by your doctor. GET HELP RIGHT AWAY IF:   You  have a temperature by mouth above 102 F (38.9 C).  The vulva area becomes more painful, puffy (swollen), or red.  You have fluid coming from the vulva area that is red or tan.  You have pain when you pee or have a hard time peeing. MAKE SURE YOU:  Understand these instructions.  Will watch your condition.  Will get help if you are not doing well or get worse. Document Released: 09/05/2008 Document Revised: 09/01/2011 Document Reviewed: 09/05/2008 [facility] Patient Information 2014 [place], [place].

## 2013-09-24 NOTE — ED Provider Notes (Signed)
Medical screening examination/treatment/procedure(s) were performed by resident physician or non-physician practitioner and as supervising physician I was immediately available for consultation/collaboration.   Pauline Good MD.   Billy Fischer, MD 09/24/13 [phone]

## 2013-09-24 NOTE — ED Notes (Signed)
Pt c/o white vag d/c onset 1 week Sxs also include: mild abd pain, right flank pain, dysuria, vag itching Also c/o small abscess on labia that's painful Denies hematuria, f/v/n/d Alert w/no signs of acute distress.

## 2013-09-26 LAB — CERVICOVAGINAL ANCILLARY ONLY
Chlamydia: NEGATIVE
Neisseria Gonorrhea: NEGATIVE
WET PREP (BD AFFIRM): NEGATIVE
Wet Prep (BD Affirm): NEGATIVE
Wet Prep (BD Affirm): NEGATIVE

## 2013-09-27 ENCOUNTER — Telehealth (HOSPITAL_COMMUNITY): Payer: Self-pay | Admitting: *Deleted

## 2013-09-27 ENCOUNTER — Encounter: Payer: Self-pay | Admitting: Family Medicine

## 2013-09-27 ENCOUNTER — Ambulatory Visit (INDEPENDENT_AMBULATORY_CARE_PROVIDER_SITE_OTHER): Payer: BC Managed Care – PPO | Admitting: Family Medicine

## 2013-09-27 ENCOUNTER — Ambulatory Visit (HOSPITAL_COMMUNITY): Payer: Self-pay | Admitting: Psychology

## 2013-09-27 VITALS — BP 139/97 | HR 80 | Temp 98.7°F | Wt 210.0 lb

## 2013-09-27 DIAGNOSIS — R300 Dysuria: Secondary | ICD-10-CM

## 2013-09-27 DIAGNOSIS — N898 Other specified noninflammatory disorders of vagina: Secondary | ICD-10-CM

## 2013-09-27 DIAGNOSIS — B379 Candidiasis, unspecified: Secondary | ICD-10-CM

## 2013-09-27 LAB — POCT URINALYSIS DIPSTICK
Bilirubin, UA: NEGATIVE
Blood, UA: NEGATIVE
Glucose, UA: NEGATIVE
Leukocytes, UA: NEGATIVE
Nitrite, UA: NEGATIVE
Protein, UA: 100
Spec Grav, UA: >=1.03
Urobilinogen, UA: 0.2
pH, UA: 7

## 2013-09-27 LAB — POCT WET PREP (WET MOUNT): Clue Cells Wet Prep Whiff POC: NEGATIVE

## 2013-09-27 LAB — POCT UA - MICROSCOPIC ONLY

## 2013-09-27 LAB — CULTURE, ROUTINE-ABSCESS

## 2013-09-27 MED ORDER — FLUCONAZOLE 100 MG PO TABS
150.0000 mg | ORAL_TABLET | Freq: Once | ORAL | Status: AC
  Administered 2013-09-27: 150 mg via ORAL

## 2013-09-27 MED ORDER — VALACYCLOVIR HCL 500 MG PO TABS: 500.0000 mg | ORAL_TABLET | Freq: Two times a day (BID) | ORAL | Status: DC

## 2013-09-27 NOTE — Addendum Note (Signed)
Addended by: Valerie Roys on: 09/27/2013 03:25 PM   Modules accepted: Orders

## 2013-09-27 NOTE — Progress Notes (Signed)
Subjective:     Patient ID: Candice [name], female   DOB: 06-20-1986, 28 y.o.   MRN: [id number]  HPI Comments: She is mainly concerned about the vaginal itching which has not improved since being evalulated by urgent care, and now has burning with urination after being treated for presumptive PID.   Vaginal Discharge The patient's primary symptoms include genital itching, pelvic pain and a vaginal discharge. The patient's pertinent negatives include no genital lesions, genital odor, genital rash or vaginal bleeding. This is a new problem. The current episode started in the past 7 days. The problem has been unchanged. The pain is mild. The problem affects both sides. She is not pregnant. Associated symptoms include back pain and dysuria. Pertinent negatives include no abdominal pain, fever, painful intercourse or rash. The vaginal discharge was clear and watery. There has been no bleeding. Nothing aggravates the symptoms. She has tried NSAIDs for the symptoms. Her past medical history is significant for PID and vaginosis.  Dysuria  This is a new problem. The current episode started in the past 7 days. The problem occurs every urination. The problem has been gradually worsening. The quality of the pain is described as burning. There has been no fever. She has tried nothing for the symptoms.     Review of Systems  Constitutional: Negative for fever.  Gastrointestinal: Negative for abdominal pain.  Genitourinary: Positive for dysuria, vaginal discharge and pelvic pain.  Musculoskeletal: Positive for back pain.  Skin: Negative for rash.       Objective:   Physical Exam  Constitutional: She appears well-developed and well-nourished.  Abdominal: Soft. There is no tenderness.  No CVA tenderness   Genitourinary:  Speculum Exam: Ext genitalia: wnl; Vaginal discharge: clear thin mucus; Cervix: no erythema or purulent discharge Bimanual Exam: Mild Cervical motion tenderness; No Vaginal wall  defects; Adnexa mildly tender    Assessment/Plan:      See Problem Focused Assessment & Plan

## 2013-09-27 NOTE — Assessment & Plan Note (Signed)
UA: Neg for UTI Wet prep: Neg, but pt report severe itching and symptoms consistent with past yeast infection - Diflucan given x 1 in clinic  (150mg )

## 2013-09-27 NOTE — Patient Instructions (Signed)
It was great seeing you today.   1. Your labs were negative for UTI or yeast infection, but due to your symptoms being consistent with prior yeast infection I will go ahead a treat you for this.   If you have any questions or concerns before then, please call the clinic at [phone].  Take Care,   Dr Phill Myron

## 2013-09-27 NOTE — ED Notes (Addendum)
Abscess culture: Few MRSA.  Pt. had I and D.  Archie Balboa PA said he saw the results and she does not need antibiotics. He just did the culture as a reference in case she was not getting better. GC/Chlamydia and Affirm all neg., HIV/RPR non-reactive.  I called pt. and left a message to call. Call 1. Candice [name] 09/27/2013 Pt. called back.  Pt. verified x 2 and given results.  Pt. told that I and D should be enough. Pt. said it is not draining any longer and now its just hard knot. She thinks it could drain again. I told her I would ask Archie Balboa PA and let her know what he says.  I told her she should call back if it gets any worse.  I reviewed the [facility] MRSA instructions with her. Renne Musca M 09/27/2013  I called pt. and told her the PA said if it re-accumulates drainage, she would have to come back again to have it drained. Pt. said it was gone. 10/04/2013

## 2013-10-05 ENCOUNTER — Encounter (HOSPITAL_COMMUNITY): Payer: Self-pay | Admitting: Psychiatry

## 2013-10-05 ENCOUNTER — Ambulatory Visit (INDEPENDENT_AMBULATORY_CARE_PROVIDER_SITE_OTHER): Payer: BC Managed Care – PPO | Admitting: Psychiatry

## 2013-10-05 VITALS — BP 107/66 | HR 82 | Ht 62.5 in | Wt 209.0 lb

## 2013-10-05 DIAGNOSIS — F1210 Cannabis abuse, uncomplicated: Secondary | ICD-10-CM

## 2013-10-05 DIAGNOSIS — F339 Major depressive disorder, recurrent, unspecified: Secondary | ICD-10-CM

## 2013-10-05 DIAGNOSIS — F332 Major depressive disorder, recurrent severe without psychotic features: Secondary | ICD-10-CM

## 2013-10-05 MED ORDER — SERTRALINE HCL 50 MG PO TABS: 50.0000 mg | ORAL_TABLET | Freq: Every day | ORAL | Status: DC
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
## 2013-10-11 ENCOUNTER — Ambulatory Visit (INDEPENDENT_AMBULATORY_CARE_PROVIDER_SITE_OTHER): Payer: BC Managed Care – PPO | Admitting: Psychology

## 2013-10-11 DIAGNOSIS — F331 Major depressive disorder, recurrent, moderate: Secondary | ICD-10-CM
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
## 2013-10-25 ENCOUNTER — Ambulatory Visit (HOSPITAL_COMMUNITY): Payer: Self-pay | Admitting: Psychology

## 2013-11-04 ENCOUNTER — Encounter (HOSPITAL_COMMUNITY): Payer: Self-pay | Admitting: Psychiatry

## 2013-11-04 ENCOUNTER — Ambulatory Visit (INDEPENDENT_AMBULATORY_CARE_PROVIDER_SITE_OTHER): Payer: BC Managed Care – PPO | Admitting: Psychiatry

## 2013-11-04 VITALS — Wt 211.0 lb

## 2013-11-04 DIAGNOSIS — F1210 Cannabis abuse, uncomplicated: Secondary | ICD-10-CM

## 2013-11-04 DIAGNOSIS — F339 Major depressive disorder, recurrent, unspecified: Secondary | ICD-10-CM

## 2013-11-04 DIAGNOSIS — F332 Major depressive disorder, recurrent severe without psychotic features: Secondary | ICD-10-CM

## 2013-11-04 MED ORDER — SERTRALINE HCL 50 MG PO TABS: 50.0000 mg | ORAL_TABLET | Freq: Every day | ORAL | Status: DC
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
## 2013-11-05 ENCOUNTER — Encounter (HOSPITAL_COMMUNITY): Payer: Self-pay | Admitting: Emergency Medicine

## 2013-11-05 ENCOUNTER — Emergency Department (HOSPITAL_COMMUNITY)
Admission: EM | Admit: 2013-11-05 | Discharge: 2013-11-05 | Disposition: A | Discharge status: Home / Self Care | Payer: BC Managed Care – PPO | Source: Home / Self Care | Attending: Emergency Medicine | Admitting: Emergency Medicine

## 2013-11-05 DIAGNOSIS — H1010 Acute atopic conjunctivitis, unspecified eye: Secondary | ICD-10-CM | POA: Diagnosis present

## 2013-11-05 DIAGNOSIS — B9789 Other viral agents as the cause of diseases classified elsewhere: Principal | ICD-10-CM

## 2013-11-05 DIAGNOSIS — H1045 Other chronic allergic conjunctivitis: Secondary | ICD-10-CM

## 2013-11-05 DIAGNOSIS — J069 Acute upper respiratory infection, unspecified: Secondary | ICD-10-CM | POA: Diagnosis present

## 2013-11-05 MED ORDER — AZELASTINE HCL 0.05 % OP SOLN: 1.0000 [drp] | Freq: Two times a day (BID) | OPHTHALMIC | Status: DC

## 2013-11-05 MED ORDER — AZITHROMYCIN 250 MG PO TABS: ORAL_TABLET | ORAL | Status: DC

## 2013-11-05 MED ORDER — FLUTICASONE PROPIONATE 50 MCG/ACT NA SUSP: 2.0000 | Freq: Every day | NASAL | Status: DC

## 2013-11-05 MED ORDER — ANTIPYRINE-BENZOCAINE 5.4-1.4 % OT SOLN: 3.0000 [drp] | OTIC | Status: DC | PRN

## 2013-11-05 NOTE — ED Provider Notes (Signed)
CSN: [id number]     Arrival date & time 11/05/13  1415 History   First MD Initiated Contact with Patient 11/05/13 1510     Chief Complaint  Patient presents with  . Otalgia   (Consider location/radiation/quality/duration/timing/severity/associated sxs/prior Treatment) HPI  URI Symptoms Cough: yes productive no Runny Nose: yes Sore Throat: yes Sinus Pressure: yes Otalgia: yes, right worse than left Eye itching: yes Shortness of Breath: no Fever/Chills: yes Nausea/Vomiting; no Diarrhea: no  Course: worsening over 4 days, got it from her son Treatments Tried: ibuprofen, allegra, decongestant nasal spray Exacerbating: nothing noted  Social - pt works at [employer] in [employer] an is supposed to work tomorrow    Past Medical History  Diagnosis Date  . Depression   . HSV 06/28/2007  . Complication of anesthesia     epidural with last pregnancy made entire left side numb, had to d/c  . Headache   . Hx of migraines    Past Surgical History  Procedure Laterality Date  . Cesarean section    . Iud removal  2005    in cervix  . Cesarean section  09/07/2011    Procedure: CESAREAN SECTION;  Surgeon: Florian Buff, MD;  Location: [facility];  Service: Gynecology;  Laterality: N/A;  Repeat cesarean section with delivery of baby boy at 30. Apgars 9/9.  Bilateral tubal ligation.  . Tubal ligation     Family History  Problem Relation Age of Onset  . Alcohol abuse Mother   . Drug abuse Mother   . Depression Mother   . Alcohol abuse Father   . Drug abuse Father   . Mental illness Sister     bipolar ptsd  . Schizophrenia Sister   . Diabetes Maternal Grandmother   . Hypertension Maternal Grandmother   . Cancer Maternal Grandmother     ovarian, breast and lung  . Depression Maternal Grandmother    History  Substance Use Topics  . Smoking status: Current Every Day Smoker -- 0.25 packs/day    Types: Cigarettes  . Smokeless tobacco: Never Used  . Alcohol Use: No     Comment:  occasional   OB History   Grav Para Term Preterm Abortions TAB SAB Ect Mult Living   2 2 2  0 0 0 0 0 0 2     Review of Systems Positive for myalgias  Negative for rash and joint swelling; otherwise see HPI Allergies  Review of patient's allergies indicates no active allergies.  Home Medications   Prior to Admission medications   Medication Sig Start Date End Date Taking? Authorizing Provider  ibuprofen (ADVIL,MOTRIN) 200 MG tablet Take 400 mg by mouth every 6 (six) hours as needed for headache, mild pain or moderate pain.    Historical Provider, MD  sertraline (ZOLOFT) 50 MG tablet Take 1 tablet (50 mg total) by mouth daily. 11/04/13 11/04/14  Kathlee Nations, MD  valACYclovir (VALTREX) 500 MG tablet Take 1 tablet (500 mg total) by mouth 2 (two) times daily. Take 1 tablet (500 mg today) by mouth twice a day for three days at the first sign of an outbreak. 09/27/13   Phill Myron, MD   BP 112/77  Pulse 76  Temp(Src) 99.3 F (37.4 C) (Oral)  Resp 18  SpO2 100% Physical Exam Gen: obese AAF, ill appearing HEENT: NCAT, injected conjunctiva bilaterally, injected TM without effusion and otalgia bilaterally, OP clear and moist, copious rhinorrhea, no lymphadenopathy Pulm: CTA-B CV: RRR, no murmurs Skin: warm,dry  ED Course  Procedures (including critical care time) Labs Review Labs Reviewed - No data to display  Imaging Review No results found.   MDM   1. Viral URI with cough   2. Allergic conjunctivitis    We'll treat symptomatically with steroid nasal spray, producing eardrops, and antihistamine drops for her conjunctivitis. Additionally I gave the patient prescription for azithromycin to be filled in 48 hours if she is not improving. F/u with PCP.     Angelica Ran, MD 11/05/13 1535

## 2013-11-05 NOTE — ED Notes (Signed)
Pt  Reports  Bilateral  Earache         With  The  r  Worse  than the  Left   Also  Reports  A  sorethroat   And        Headache   With  Symptoms  X  4  Days          Pt  Reports  The  Symptoms  Not  releived  By  OTC  meds

## 2013-11-05 NOTE — Discharge Instructions (Signed)
Ms. Estabrook,   I believe you have a bad viral infection causing her symptoms. Please do the following things:   1. Use eye drops today 2. Use nasal spray twice a day 3. Use the ear drops for pain 4. Continue the ibuprofen for muscle aches.  5. If you are not better in 48 hours, then start azithromycin and follow up with Dr. Venetia Maxon.   I hope you feel better soon.  Dr. Maricela Bo

## 2013-11-07 NOTE — ED Provider Notes (Signed)
Medical screening examination/treatment/procedure(s) were performed by a resident physician and as supervising physician I was immediately available for consultation/collaboration.  Philipp Deputy, M.D.  Harden Mo, MD 11/07/13 [phone]

## 2013-11-08 ENCOUNTER — Ambulatory Visit (HOSPITAL_COMMUNITY): Payer: Self-pay | Admitting: Psychology

## 2013-12-20 ENCOUNTER — Telehealth: Payer: Self-pay | Admitting: Family Medicine

## 2013-12-20 NOTE — Telephone Encounter (Signed)
Has some bumps on her legs and other parts of her body. Thinks it could be mersa. Could a prescription be called in?

## 2014-02-06 ENCOUNTER — Ambulatory Visit (HOSPITAL_COMMUNITY): Payer: Self-pay | Admitting: Psychiatry

## 2014-02-20 ENCOUNTER — Ambulatory Visit: Payer: Self-pay | Admitting: Family Medicine

## 2014-03-16 ENCOUNTER — Telehealth: Payer: Self-pay | Admitting: Family Medicine

## 2014-03-16 MED ORDER — VALACYCLOVIR HCL 500 MG PO TABS: ORAL_TABLET | ORAL | Status: DC

## 2014-03-16 MED ORDER — FLUCONAZOLE 150 MG PO TABS: ORAL_TABLET | ORAL | Status: DC

## 2014-03-16 NOTE — Telephone Encounter (Signed)
Red Team: Please let pt know that Rx's sent in to [facility] on [address]. Pt will need to come into clinic for visit for further refills. Thanks. --CMS

## 2014-03-16 NOTE — Telephone Encounter (Signed)
Pt informed. Ketsia Linebaugh CMA 

## 2014-03-16 NOTE — Telephone Encounter (Signed)
Pt requesting refill on Valtrex, is currently having an herpes outbreak. Also, request RX for Diflucan. Pt currently has not insurance and cannot afford to be seen. Pls advise.

## 2014-04-04 ENCOUNTER — Encounter (HOSPITAL_COMMUNITY): Payer: Self-pay | Admitting: Psychology

## 2014-04-04 DIAGNOSIS — F331 Major depressive disorder, recurrent, moderate: Secondary | ICD-10-CM

## 2014-04-04 NOTE — Progress Notes (Signed)
Candice [name] is a 28 y.o. female patient who hasn't come to counseling since 10/11/13.  Outpatient Therapist Discharge Summary  [name]    [birth year]   Admission Date: 06/29/13   Discharge Date:  04/04/14 Reason for Discharge:  Not active since 10/11/13 Diagnosis:    Major depressive disorder, recurrent episode, moderate    Comments:  Pt no showed 2 appointments and cancelled 1 appointment since 10/11/13  Delle Reining, [facility]

## 2014-04-24 ENCOUNTER — Encounter (HOSPITAL_COMMUNITY): Payer: Self-pay | Admitting: Emergency Medicine

## 2014-06-29 ENCOUNTER — Emergency Department (HOSPITAL_COMMUNITY)
Admission: EM | Admit: 2014-06-29 | Discharge: 2014-06-29 | Disposition: A | Discharge status: Home / Self Care | Payer: BLUE CROSS/BLUE SHIELD | Source: Home / Self Care | Attending: Emergency Medicine | Admitting: Emergency Medicine

## 2014-06-29 ENCOUNTER — Encounter (HOSPITAL_COMMUNITY): Payer: Self-pay | Admitting: Emergency Medicine

## 2014-06-29 DIAGNOSIS — J029 Acute pharyngitis, unspecified: Secondary | ICD-10-CM

## 2014-06-29 DIAGNOSIS — H109 Unspecified conjunctivitis: Secondary | ICD-10-CM

## 2014-06-29 LAB — POCT RAPID STREP A: Streptococcus, Group A Screen (Direct): NEGATIVE

## 2014-06-29 MED ORDER — HYDROCOD POLST-CHLORPHEN POLST 10-8 MG/5ML PO LQCR: 5.0000 mL | Freq: Two times a day (BID) | ORAL | Status: DC | PRN

## 2014-06-29 MED ORDER — POLYMYXIN B-TRIMETHOPRIM 10000-0.1 UNIT/ML-% OP SOLN: 1.0000 [drp] | OPHTHALMIC | Status: DC

## 2014-06-29 MED ORDER — AMOXICILLIN 500 MG PO CAPS: 1000.0000 mg | ORAL_CAPSULE | Freq: Three times a day (TID) | ORAL | Status: DC

## 2014-06-29 NOTE — Discharge Instructions (Signed)

## 2014-06-29 NOTE — ED Notes (Signed)
Reports recently getting battling cold symptoms.  States past few days eyes are red with drainage.  Sore throat.  N/v.  Denies diarrhea.   Symptoms present x 2 wks.  No relief with otc cold meds.

## 2014-06-29 NOTE — ED Provider Notes (Signed)
   Chief Complaint   Conjunctivitis and Sore Throat   History of Present Illness   Candice [name] is a 29 year old female who's had a two-week history of upper respiratory symptoms with cough productive yellow-green sputum, nasal congestion, chills, sore throat, redness of eyes with yellow-green drainage, headache, right ear pain, nausea, and vomiting. She's been exposed to both her children who tested positive for strep today.  Review of Systems   Other than as noted above, the patient denies any of the following symptoms: Systemic:  No fevers, chills, sweats, or myalgias. Eye:  No redness or discharge. ENT:  No ear pain, headache, nasal congestion, drainage, sinus pressure, or sore throat. Neck:  No neck pain, stiffness, or swollen glands. Lungs:  No cough, sputum production, hemoptysis, wheezing, chest tightness, shortness of breath or chest pain. GI:  No abdominal pain, nausea, vomiting or diarrhea.  [place]   Past medical history, family history, social history, meds, and allergies were reviewed.   Physical exam   Vital signs:  BP 113/74 mmHg  Pulse 70  Temp(Src) 98.6 F (37 C) (Oral)  Resp 16  SpO2 97% General:  Alert and oriented.  In no distress.  Skin warm and dry. Eye:  Mild bilateral conjunctival injection, no discharge or drainage. Lids were normal. ENT:  TMs and canals were normal, without erythema or inflammation.  Nasal mucosa was clear and uncongested, without drainage.  Mucous membranes were moist.  Tonsils were enlarged but not erythematous and there is a small amount of exudate on the left tonsil.  There were no oral ulcerations or lesions. Neck:  Supple, no adenopathy, tenderness or mass. Lungs:  No respiratory distress.  Lungs were clear to auscultation, without wheezes, rales or rhonchi.  Breath sounds were clear and equal bilaterally.  Heart:  Regular rhythm, without gallops, murmers or rubs. Skin:  Clear, warm, and dry, without rash or lesions.  Labs     Results for orders placed or performed during the hospital encounter of 06/29/14  POCT rapid strep A [facility])  Result Value Ref Range   Streptococcus, Group A Screen (Direct) NEGATIVE NEGATIVE    Assessment     The primary encounter diagnosis was Pharyngitis. A diagnosis of Bilateral conjunctivitis was also pertinent to this visit.  Since she's been exposed to strep in both of her children, will go ahead and treat her as well.  Plan    1.  Meds:  The following meds were prescribed:   New Prescriptions   AMOXICILLIN (AMOXIL) 500 MG CAPSULE    Take 2 capsules (1,000 mg total) by mouth 3 (three) times daily.   CHLORPHENIRAMINE-HYDROCODONE (TUSSIONEX) 10-8 MG/5ML LQCR    Take 5 mLs by mouth every 12 (twelve) hours as needed for cough.   TRIMETHOPRIM-POLYMYXIN B (POLYTRIM) OPHTHALMIC SOLUTION    Place 1 drop into both eyes every 4 (four) hours.    2.  Patient Education/Counseling:  The patient was given appropriate handouts, self care instructions, and instructed in symptomatic relief.  Instructed to get extra fluids and extra rest.    3.  Follow up:  The patient was told to follow up here if no better in 3 to 4 days, or sooner if becoming worse in any way, and given some red flag symptoms such as increasing fever, difficulty breathing, chest pain, or persistent vomiting which would prompt immediate return.       Harden Mo, MD 06/29/14 1050

## 2014-07-01 LAB — CULTURE, GROUP A STREP

## 2014-09-25 ENCOUNTER — Ambulatory Visit (INDEPENDENT_AMBULATORY_CARE_PROVIDER_SITE_OTHER): Payer: BLUE CROSS/BLUE SHIELD | Admitting: Family Medicine

## 2014-09-25 ENCOUNTER — Other Ambulatory Visit (HOSPITAL_COMMUNITY)
Admission: RE | Admit: 2014-09-25 | Discharge: 2014-09-25 | Disposition: A | Discharge status: Home / Self Care | Payer: BLUE CROSS/BLUE SHIELD | Source: Ambulatory Visit | Attending: Family Medicine | Admitting: Family Medicine

## 2014-09-25 DIAGNOSIS — M791 Myalgia: Secondary | ICD-10-CM

## 2014-09-25 DIAGNOSIS — Z23 Encounter for immunization: Secondary | ICD-10-CM | POA: Diagnosis not present

## 2014-09-25 DIAGNOSIS — Z720 Tobacco use: Secondary | ICD-10-CM | POA: Diagnosis not present

## 2014-09-25 DIAGNOSIS — Z01419 Encounter for gynecological examination (general) (routine) without abnormal findings: Secondary | ICD-10-CM

## 2014-09-25 DIAGNOSIS — Z1151 Encounter for screening for human papillomavirus (HPV): Secondary | ICD-10-CM | POA: Insufficient documentation

## 2014-09-25 DIAGNOSIS — Z22322 Carrier or suspected carrier of Methicillin resistant Staphylococcus aureus: Secondary | ICD-10-CM | POA: Diagnosis not present

## 2014-09-25 DIAGNOSIS — F17200 Nicotine dependence, unspecified, uncomplicated: Secondary | ICD-10-CM

## 2014-09-25 DIAGNOSIS — M7918 Myalgia, other site: Secondary | ICD-10-CM

## 2014-09-25 MED ORDER — MUPIROCIN 2 % EX OINT: 1.0000 "application " | TOPICAL_OINTMENT | Freq: Two times a day (BID) | CUTANEOUS | Status: DC

## 2014-09-25 NOTE — Progress Notes (Signed)
Subjective:    Patient ID: Candice [name], female    DOB: Oct 26, 1985, 29 y.o.   MRN: [id number]  HPI:  Pt presents to clinic for her annual physical exam. Overall she feels "fine." She is due for a Pap smear and Tdap, today. She is sexually active but has no frank vaginal symptoms. She is finishing her menses and has "typical" flow without great discomfort. She does complain of back pain / chest wall pain from very large breasts and requests information for reduction. She has been referred to different surgeons before but is unsure about insurance coverage. Pt is a current smoker. She is not interested in quitting.  Of note, pt requests Rx for mupirocin for MRSA eradication; she states she was told at urgent care she had MRSA in her nose.  Family History  Problem Relation Age of Onset  . Alcohol abuse Mother   . Drug abuse Mother   . Depression Mother   . Alcohol abuse Father   . Drug abuse Father   . Mental illness Sister     bipolar ptsd  . Schizophrenia Sister   . Diabetes Maternal Grandmother   . Hypertension Maternal Grandmother   . Cancer Maternal Grandmother     ovarian, breast and lung  . Depression Maternal Grandmother     Past Medical History  Diagnosis Date  . Depression   . HSV 06/28/2007  . Complication of anesthesia     epidural with last pregnancy made entire left side numb, had to d/c  . Headache   . Hx of migraines     Past Surgical History  Procedure Laterality Date  . Cesarean section    . Iud removal  2005    in cervix  . Cesarean section  09/07/2011    Procedure: CESAREAN SECTION;  Surgeon: Florian Buff, MD;  Location: [facility];  Service: Gynecology;  Laterality: N/A;  Repeat cesarean section with delivery of baby boy at 21. Apgars 9/9.  Bilateral tubal ligation.  . Tubal ligation      History   Social History  . Marital Status: Single    Spouse Name: N/A  . Number of Children: N/A  . Years of Education: N/A   Occupational History    . Not on file.   Social History Main Topics  . Smoking status: Current Every Day Smoker -- 0.25 packs/day    Types: Cigarettes  . Smokeless tobacco: Never Used  . Alcohol Use: 0.0 oz/week     Comment: occasional  . Drug Use: 4.00 per week    Special: Marijuana  . Sexual Activity: Yes    Birth Control/ Protection: None     Comment: pregnant   Other Topics Concern  . Not on file   Social History Narrative   Works as Glass blower/designer.   FOB of both children lives in [place]; will be some what involved but mother is no longer dating him.    In addition to the above documentation, pt's PMH, surgical history, FH, and SH all reviewed and updated where appropriate in the EMR. I have also reviewed and updated the pt's allergies and current medications as appropriate.  Review of Systems: As above. Otherwise, full 12-system ROS was reviewed and all negative.     Objective:   Physical Exam There were no vitals taken for this visit. Gen: well-appearing adult female in NAD HEENT: [place]/AT, sclerae/conjunctivae clear, no lid lag, EOMI, PERRLA   MMM, posterior oropharynx clear, no cervical lymphadenopathy  neck supple with full ROM, no masses appreciated; thyroid not enlarged  Cardio: RRR, no murmur appreciated; distal pulses intact/symmetric Chest: normal appearance of chest wall without frank tenderness; very large, pendulous breasts Pulm: CTAB, no wheezes, normal WOB  Abd: soft, nondistended, BS+, no HSM; obese GU: normal external vaginal / vulvar structures; small amount of menstrual discharge  Speculum exam: small amount of menstrual discharge present in vaginal vault   Vaginal walls and cervix normal in appearance without frank lesions or friability  Bimanual exam: deferred given otherwise normal exam and no symptoms Ext: warm/well-perfused, no cyanosis/clubbing/edema MSK: strength 5/5 in all four extremities, no frank joint deformity/effusion  normal ROM to all four extremities  with no point muscle/bony tenderness in spine Neuro/Psych: alert/oriented, sensation grossly intact; normal gait/balance  mood euthymic with congruent affect     Assessment & Plan:  29yo female in general good health; obese but wishing to make dietary changes - very large pendulous breasts, interested in reduction due to chronic chest wall and back pain related to weight of her breasts - advised discussing with insurance provider about coverage for breast reduction and requesting referral if / when needed (would not need a new visit for this) - incidentally provided Rx for mupirocin per pt request for MRSA eradication; f/u PRN (counseled on likelihood of recolonization)  Anticipatory guidance / Risk factor reduction - counseled on use of [email or web address] for meal planning, portion control, etc - suggested f/u with Dr. Jenne Campus for nutrition counseling, which pt will consider in the future - advised smoking cessation; pt declined formal assistance, but offered this if pt so chooses in the future - otherwise recommended good PCP f/u at least yearly or more often if needed  Immunization / screening / ancillary studies  - Pap collected today - Tdap given today  Emmaline Kluver, MD PGY-3, [facility] 09/26/2014, 12:38 AM

## 2014-09-25 NOTE — Patient Instructions (Addendum)
Thank you for coming in, today!  Everything looks okay, today. I will call you or send you a letter with your pap result. If everything is normal, we'll repeat it in 3 years. I would recommend [email or web address] for help with meal planning, portion sizes, etc. If you try this website and would like to see our nutritionist here, Dr. Jenne Campus, call us to ask for a referral to see her.  Follow up regularly with your psychiatrist. I would strongly recommend taking your medication regularly, every day.  Call your insurance to ask about breast reduction surgery. Once / if they tell you which type of surgeon (or a specific office to call), call that surgeon and ask if you need a referral. If you do need a referral, call me to let me know and I'll put it in.  Come back to see [place] as needed, or in 1 year for your next wellness visit. Please feel free to call with any questions or concerns at any time, at [phone]. --Dr. Venetia Maxon

## 2014-09-26 ENCOUNTER — Encounter: Payer: Self-pay | Admitting: Family Medicine

## 2014-09-26 LAB — CYTOLOGY - PAP

## 2014-09-28 ENCOUNTER — Telehealth: Payer: Self-pay | Admitting: Family Medicine

## 2014-09-28 DIAGNOSIS — A599 Trichomoniasis, unspecified: Secondary | ICD-10-CM

## 2014-09-28 MED ORDER — METRONIDAZOLE 500 MG PO TABS: 500.0000 mg | ORAL_TABLET | Freq: Three times a day (TID) | ORAL | Status: DC

## 2014-09-28 NOTE — Telephone Encounter (Signed)
Called pt to discuss lab results. Pap smear normal; recommend repeat in 3 years. Pap showed Trichomonas; explained to pt that this is typically an STI (she reports being sexually active with a new partner, but has no symptoms, currently) and that the treatment is Flagyl 500 mg TID for 7 days. Advised abstaining from sexual activity completely until she is treated and strongly recommended abstinence from sexual activity with her current partner until he is tested / treated, as appropriate. Pt voiced understanding. Rx sent electronically today. F/u as needed. --CMS

## 2014-10-03 ENCOUNTER — Encounter: Payer: Self-pay | Admitting: Family Medicine

## 2014-10-04 ENCOUNTER — Telehealth: Payer: Self-pay | Admitting: Family Medicine

## 2014-10-04 NOTE — Telephone Encounter (Signed)
Received pt email with questions about Trichomonas; pt confused and concerned that she could not see result in MyChart (explained that the result was part of her Pap smear result report form, which is not visible on MyChart). Pt's partner tested negative (explained that it is possible to have asymptomatic infections, so she could have had it for longer than she has been active with her current partner and that it is possible to have sex and NOT transmit it, which could explain his result). Explained that she does not need test of cure and can resume sexual activity once she completes her course of treatment with Flagyl. Advised her to f/u as needed. Pt voiced understanding. Documentation of discussion also recorded in [place] email response encounter. --CMS

## 2014-10-04 NOTE — Telephone Encounter (Signed)
See phone note dated today. --CMS

## 2014-10-27 ENCOUNTER — Emergency Department (HOSPITAL_COMMUNITY)
Admission: EM | Admit: 2014-10-27 | Discharge: 2014-10-27 | Disposition: A | Discharge status: Home / Self Care | Payer: BLUE CROSS/BLUE SHIELD | Attending: Emergency Medicine | Admitting: Emergency Medicine

## 2014-10-27 ENCOUNTER — Encounter (HOSPITAL_COMMUNITY): Payer: Self-pay | Admitting: *Deleted

## 2014-10-27 DIAGNOSIS — R350 Frequency of micturition: Secondary | ICD-10-CM | POA: Insufficient documentation

## 2014-10-27 DIAGNOSIS — Z720 Tobacco use: Secondary | ICD-10-CM | POA: Insufficient documentation

## 2014-10-27 DIAGNOSIS — R300 Dysuria: Secondary | ICD-10-CM | POA: Insufficient documentation

## 2014-10-27 DIAGNOSIS — Z3202 Encounter for pregnancy test, result negative: Secondary | ICD-10-CM | POA: Insufficient documentation

## 2014-10-27 DIAGNOSIS — R319 Hematuria, unspecified: Secondary | ICD-10-CM | POA: Diagnosis present

## 2014-10-27 DIAGNOSIS — Z87440 Personal history of urinary (tract) infections: Secondary | ICD-10-CM | POA: Diagnosis not present

## 2014-10-27 DIAGNOSIS — F329 Major depressive disorder, single episode, unspecified: Secondary | ICD-10-CM | POA: Diagnosis not present

## 2014-10-27 DIAGNOSIS — Z792 Long term (current) use of antibiotics: Secondary | ICD-10-CM | POA: Diagnosis not present

## 2014-10-27 DIAGNOSIS — Z79899 Other long term (current) drug therapy: Secondary | ICD-10-CM | POA: Insufficient documentation

## 2014-10-27 DIAGNOSIS — Z8679 Personal history of other diseases of the circulatory system: Secondary | ICD-10-CM | POA: Insufficient documentation

## 2014-10-27 DIAGNOSIS — Z7951 Long term (current) use of inhaled steroids: Secondary | ICD-10-CM | POA: Insufficient documentation

## 2014-10-27 DIAGNOSIS — Z8619 Personal history of other infectious and parasitic diseases: Secondary | ICD-10-CM | POA: Insufficient documentation

## 2014-10-27 LAB — URINALYSIS, ROUTINE W REFLEX MICROSCOPIC
Bilirubin Urine: NEGATIVE
Glucose, UA: NEGATIVE mg/dL
Ketones, ur: NEGATIVE mg/dL
NITRITE: NEGATIVE
PROTEIN: 100 mg/dL — AB
Specific Gravity, Urine: 1.028 (ref 1.005–1.030)
UROBILINOGEN UA: 1 mg/dL (ref 0.0–1.0)
pH: 7.5 (ref 5.0–8.0)

## 2014-10-27 LAB — URINE MICROSCOPIC-ADD ON

## 2014-10-27 LAB — POC URINE PREG, ED: Preg Test, Ur: NEGATIVE

## 2014-10-27 MED ORDER — CEPHALEXIN 500 MG PO CAPS: 500.0000 mg | ORAL_CAPSULE | Freq: Four times a day (QID) | ORAL | Status: DC

## 2014-10-27 NOTE — ED Provider Notes (Signed)
CSN: [id number]     Arrival date & time 10/27/14  1829 History  This chart was scribed for non-physician practitioner, Etta Quill, NP, working with Elnora Morrison, MD, by Delphia Grates, ED Scribe. This patient was seen in room TR03C/TR03C and the patient's care was started at 7:59 PM.     Chief Complaint  Patient presents with  . Hematuria    Patient is a 29 y.o. female presenting with hematuria. The history is provided by the patient. No language interpreter was used.  Hematuria This is a new problem. The problem occurs rarely. The problem has not changed since onset.Pertinent negatives include no chest pain, no abdominal pain, no headaches and no shortness of breath. Nothing aggravates the symptoms. Nothing relieves the symptoms. She has tried nothing for the symptoms. The treatment provided no relief.     HPI Comments: Candice [name] is a 29 y.o. female who presents to the Emergency Department complaining of sudden onset hematuria that began today. There is associated frequency (4 times in 1 hour; small amounts) with mild dysuria. Patient is sexually active and reports she was recently treated for trichomonas. Patient reports remote history of UTI. She denies fever, chills, vaginal discharge, abdominal pain, nausea, or vomiting. Patient states she recently came off her period.    Past Medical History  Diagnosis Date  . Depression   . HSV 06/28/2007  . Complication of anesthesia     epidural with last pregnancy made entire left side numb, had to d/c  . Headache   . Hx of migraines    Past Surgical History  Procedure Laterality Date  . Cesarean section    . Iud removal  2005    in cervix  . Cesarean section  09/07/2011    Procedure: CESAREAN SECTION;  Surgeon: Florian Buff, MD;  Location: [facility];  Service: Gynecology;  Laterality: N/A;  Repeat cesarean section with delivery of baby boy at 60. Apgars 9/9.  Bilateral tubal ligation.  . Tubal ligation     Family History   Problem Relation Age of Onset  . Alcohol abuse Mother   . Drug abuse Mother   . Depression Mother   . Alcohol abuse Father   . Drug abuse Father   . Mental illness Sister     bipolar ptsd  . Schizophrenia Sister   . Diabetes Maternal Grandmother   . Hypertension Maternal Grandmother   . Cancer Maternal Grandmother     ovarian, breast and lung  . Depression Maternal Grandmother    History  Substance Use Topics  . Smoking status: Current Every Day Smoker -- 0.25 packs/day    Types: Cigarettes  . Smokeless tobacco: Never Used  . Alcohol Use: 0.0 oz/week     Comment: occasional   OB History    Gravida Para Term Preterm AB TAB SAB Ectopic Multiple Living   2 2 2  0 0 0 0 0 0 2     Review of Systems  Respiratory: Negative for shortness of breath.   Cardiovascular: Negative for chest pain.  Gastrointestinal: Negative for abdominal pain.  Genitourinary: Positive for hematuria.  Neurological: Negative for headaches.  All other systems reviewed and are negative.     Allergies  Review of patient's allergies indicates no known allergies.  Home Medications   Prior to Admission medications   Medication Sig Start Date End Date Taking? Authorizing Provider  antipyrine-benzocaine Toniann Fail) otic solution Place 3-4 drops into both ears every 2 (two) hours as needed for ear  pain. 11/05/13   Angelica Ran, MD  azelastine (OPTIVAR) 0.05 % ophthalmic solution Place 1 drop into both eyes 2 (two) times daily. 11/05/13   Angelica Ran, MD  azithromycin (ZITHROMAX Z-PAK) 250 MG tablet Take 2 tablets on day 1 and 1 tablet for the next 4 days. 11/05/13   Angelica Ran, MD  chlorpheniramine-HYDROcodone (TUSSIONEX) 10-8 MG/5ML LQCR Take 5 mLs by mouth every 12 (twelve) hours as needed for cough. 06/29/14   Harden Mo, MD  fluconazole (DIFLUCAN) 150 MG tablet Take 1 tablet one time at the first sign of yeast infection. Repeat in 7 days if symptoms do not clear. 03/16/14    [place], MD  fluticasone [facility]) 50 MCG/ACT nasal spray Place 2 sprays into both nostrils daily. 11/05/13   Angelica Ran, MD  ibuprofen (ADVIL,MOTRIN) 200 MG tablet Take 400 mg by mouth every 6 (six) hours as needed for headache, mild pain or moderate pain.    Historical Provider, MD  metroNIDAZOLE (FLAGYL) 500 MG tablet Take 1 tablet (500 mg total) by mouth 3 (three) times daily. 09/28/14   [place], MD  mupirocin ointment (BACTROBAN) 2 % Place 1 application into the nose 2 (two) times daily. For 7 days. 09/25/14   Sharon [address], MD  sertraline (ZOLOFT) 50 MG tablet Take 1 tablet (50 mg total) by mouth daily. 11/04/13 11/04/14  Kathlee Nations, MD  valACYclovir (VALTREX) 500 MG tablet Take 1 tablet (500 mg) by mouth twice a day for three days at the first sign of an outbreak. 03/16/14   [place], MD   Triage Vitals: BP 135/77 mmHg  Pulse 71  Temp(Src) 98.4 F (36.9 C) (Oral)  Resp 18  SpO2 98%  LMP 10/18/2014  Physical Exam  Constitutional: She is oriented to person, place, and time. She appears well-developed and well-nourished. No distress.  HENT:  Head: Normocephalic and atraumatic.  Eyes: Conjunctivae and EOM are normal.  Neck: Neck supple. No tracheal deviation present.  Cardiovascular: Normal rate.   Pulmonary/Chest: Effort normal. No respiratory distress.  Abdominal: There is no CVA tenderness.  Musculoskeletal: Normal range of motion.  Neurological: She is alert and oriented to person, place, and time.  Skin: Skin is warm and dry.  Psychiatric: She has a normal mood and affect. Her behavior is normal.  Nursing note and vitals reviewed.   ED Course  Procedures (including critical care time)  DIAGNOSTIC STUDIES: Oxygen Saturation is 98% on room air, normal by my interpretation.    COORDINATION OF CARE: At 2004 Discussed treatment plan with patient. Patient agrees.   Labs Review Labs Reviewed  URINALYSIS, ROUTINE W REFLEX  MICROSCOPIC  POC URINE PREG, ED    Imaging Review No results found.   EKG Interpretation None      MDM   Final diagnoses:  None  Hematuria. No dysuria, flank pain, or CVA tenderness. No history of kidney stones or urinary tract abnormalities. UA with possible UTI.  Discussed with Dr. Reather Converse.  Will culture and start on keflex. Follow-up with PCP. Return precautions discussed.    I personally performed the services described in this documentation, which was scribed in my presence. The recorded information has been reviewed and is accurate.     Etta Quill, NP 10/28/14 2297  Elnora Morrison, MD 10/28/14 [phone]

## 2014-10-27 NOTE — ED Notes (Signed)
Pt unable to obtain urine sample at triage. 

## 2014-10-27 NOTE — Discharge Instructions (Signed)

## 2014-10-27 NOTE — ED Notes (Signed)
Pt reports sudden onset of urinary frequency today, pain with urination and then blood in urine. No vaginal complaints. No acute distress noted.

## 2014-10-28 LAB — URINE CULTURE
Colony Count: 70000
Special Requests: NORMAL

## 2014-11-01 ENCOUNTER — Telehealth: Payer: Self-pay | Admitting: Family Medicine

## 2014-11-01 NOTE — Telephone Encounter (Signed)
Pt called because she was in the ER this weekend and they ran test. They told her to call her PCP to get the results. jw

## 2014-11-01 NOTE — Telephone Encounter (Signed)
Red Team: Please call pt back to let her know the following: her test that the ED checked was a urine culture, and her culture did not show one particular type of bacteria but it does look like she has a urinary tract infection. She should continue the Keflex (cephalexin) the antibiotic prescribed from the ED. She can also try AZO products over the counter for any pain / burning with urination -- they can help her at her pharmacy finding these products. If she is still having symptoms despite Keflex and any over-the-counter products, she should make an appointment with me or as a same-day with someone else, to be re-evaluated. Otherwise, she should follow up with me as needed. Thanks. --CMS

## 2014-11-01 NOTE — Telephone Encounter (Signed)
Pt informed. Jodeci Rini, CMA  

## 2015-02-09 ENCOUNTER — Encounter: Payer: Self-pay | Admitting: Family Medicine

## 2015-02-09 ENCOUNTER — Ambulatory Visit (INDEPENDENT_AMBULATORY_CARE_PROVIDER_SITE_OTHER): Payer: BLUE CROSS/BLUE SHIELD | Admitting: Family Medicine

## 2015-02-09 VITALS — BP 140/79 | HR 83 | Temp 99.2°F | Ht 63.0 in | Wt 196.5 lb

## 2015-02-09 DIAGNOSIS — L739 Follicular disorder, unspecified: Secondary | ICD-10-CM | POA: Diagnosis not present

## 2015-02-09 DIAGNOSIS — A6000 Herpesviral infection of urogenital system, unspecified: Secondary | ICD-10-CM

## 2015-02-09 MED ORDER — VALACYCLOVIR HCL 500 MG PO TABS: ORAL_TABLET | ORAL | Status: DC

## 2015-02-09 NOTE — Progress Notes (Signed)
Subjective:     Patient ID: Candice [name], female   DOB: 28-Nov-1985, 29 y.o.   MRN: [id number]  H&P: written and performed by Medical student Kara Dies Pisanie under supervision by Dr. Andria Frames  HPI  FOLLICULITIS  Candice [name] is a 29 y.o F with a PMHx significant for depression, genital herpes and past MRSA folliculits presents with a 3 days history of self diagnosed folliculitis. She states the rash is present on her buttocks, and left inner thigh. She says it is milder compared to her previous infection but is worried because of how bad it was last time. She was given mupirocin and some oral abx for treatment last time which worked. She admits to shaving regularly. She has been putting on left over mupirocin which has not seemed to help. She denies any fevers, cough, N/V, diarrhea or other symptoms.  GENITAL HERPES Patient says she has sporadic outbreaks and can feel the onset of an outbreak. She is amenable to prophylactic valacyclovir treatment when she feels an outbreak coming on. She has tolerated the medicine well in the past.   Review of Systems Pertinent ROS as per HPI    Objective:   Physical Exam Focused physical General: NAD well appearing female SKIN: One unroofed lesion on her right outer thigh, one pustule on her left inner thigh. No erythema, excoriations, swelling, or other lesions seen. Mild acne on forehead.      Assessment and Plan  See Problem list for Assessment and Plan  Folliculitis: Very mild case 3 lesions maximum, no signs of spreading infection, cellulitis, abscess or erysipelas. No need for abx treatment. - Mupirocin BID to sites of infection  - Advised to shave as little as possible  - Patient educated and Information provided about folliculitis and MRSA to help her understanding and quell fears. - Told to come back in if rash worsens significantly.   HERPES: - PPx valacyclovir prescribed for patient to take when she thinks she may have an outbreak.  Patient understands.

## 2015-02-09 NOTE — Assessment & Plan Note (Signed)
Education and refill valtrex.

## 2015-02-09 NOTE — Progress Notes (Signed)
   Subjective:    Patient ID: Candice [name], female    DOB: January 02, 1986, 29 y.o.   MRN: [id number]  HPI Skin rash.  Very worried about MRSA.  Did use nasal bactroban in the past.   Also had questions of Herpes Genitalis  Cleaned both med list and problem list.    Review of Systems     Objective:   Physical Exam  Folliculitis of thigh, groin and buttocks is very mild.        Assessment & Plan:

## 2015-02-09 NOTE — Assessment & Plan Note (Signed)
Mild.  Continue bactroban.  Education done to help her better cope with and prevent future flairs.

## 2015-02-09 NOTE — Patient Instructions (Addendum)
Thank you for coming in today!  The folliculitis is luckily a mild case, we understand why you came in because of you last experience with it.  You can treat the spots on your legs with the mupirocin cream and this will help it go away. Apply the cream 2x a day and let it set. You can also use warm compresses to help the lesions pop and drain. Come back in to see [place] if the lesions start to spread coving your legs. Shaving can is the cause of these lesions so shaving as little as possible is best.   Thank you again.   Folliculitis  Folliculitis is redness, soreness, and swelling (inflammation) of the hair follicles. This condition can occur anywhere on the body. People with weakened immune systems, diabetes, or obesity have a greater risk of getting folliculitis. CAUSES  Bacterial infection. This is the most common cause.  Fungal infection.  Viral infection.  Contact with certain chemicals, especially oils and tars. Long-term folliculitis can result from bacteria that live in the nostrils. The bacteria may trigger multiple outbreaks of folliculitis over time. SYMPTOMS Folliculitis most commonly occurs on the scalp, thighs, legs, back, buttocks, and areas where hair is shaved frequently. An early sign of folliculitis is a small, white or yellow, pus-filled, itchy lesion (pustule). These lesions appear on a red, inflamed follicle. They are usually less than 0.2 inches (5 mm) wide. When there is an infection of the follicle that goes deeper, it becomes a boil or furuncle. A group of closely packed boils creates a larger lesion (carbuncle). Carbuncles tend to occur in hairy, sweaty areas of the body. DIAGNOSIS  Your caregiver can usually tell what is wrong by doing a physical exam. A sample may be taken from one of the lesions and tested in a lab. This can help determine what is causing your folliculitis. TREATMENT  Treatment may include:  Applying warm compresses to the affected  areas.  Taking antibiotic medicines orally or applying them to the skin.  Draining the lesions if they contain a large amount of pus or fluid.  Laser hair removal for cases of long-lasting folliculitis. This helps to prevent regrowth of the hair. HOME CARE INSTRUCTIONS  Apply warm compresses to the affected areas as directed by your caregiver.  If antibiotics are prescribed, take them as directed. Finish them even if you start to feel better.  You may take over-the-counter medicines to relieve itching.  Do not shave irritated skin.  Follow up with your caregiver as directed. SEEK IMMEDIATE MEDICAL CARE IF:   You have increasing redness, swelling, or pain in the affected area.  You have a fever. MAKE SURE YOU:  Understand these instructions.  Will watch your condition.  Will get help right away if you are not doing well or get worse. Document Released: 08/18/2001 Document Revised: 12/09/2011 Document Reviewed: 09/09/2011 [facility] Patient Information 2015 [place], [place]. This information is not intended to replace advice given to you by your health care provider. Make sure you discuss any questions you have with your health care provider. MRSA FAQs What is MRSA? Staphylococcus aureus (pronounced staff-ill-oh-KOK-us AW-ree-us), or "Staph" is a very common germ that about 1 out of every 3 people have on their skin or in their nose. This germ does not cause any problems for most people who have it on their skin. But sometimes it can cause serious infections such as skin or wound infections, pneumonia, or infections of the blood.  Antibiotics are given to  kill Staph germs when they cause infections. Some Staph are resistant, meaning they cannot be killed by some antibiotics. "Methicillin-resistant Staphylococcus aureus" or "MRSA" is a type of Staph that is resistant to some of the antibiotics that are often used to treat Staph infections. Who is most likely to get an MRSA  infection? In the hospital, people who are more likely to get an MRSA infection are people who:  have other health conditions making them sick  have been in the hospital or a nursing home  have been treated with antibiotics. People who are healthy and who have not been in the hospital or a nursing home can also get MRSA infections. These infections usually involve the skin. More information about this type of MRSA infection, known as "community-associated MRSA" infection, is available from the Centers for Disease Control and Prevention (CDC). http://rios.biz/ How do I get an MRSA infection? People who have MRSA germs on their skin or who are infected with MRSA may be able to spread the germ to other people. MRSA can be passed on to bed linens, bed rails, bathroom fixtures, and medical equipment. It can spread to other people on contaminated equipment and on the hands of doctors, nurses, other healthcare providers and visitors. Can MRSA infections be treated? Yes, there are antibiotics that can kill MRSA germs. Some patients with MRSA abscesses may need surgery to drain the infection. Your healthcare provider will determine which treatments are best for you. What are some of the things that hospitals are doing to prevent MRSA infections? To prevent MRSA infections, doctors, nurses and other healthcare providers:  Clean their hands with soap and water or an alcohol-based hand rub before and after caring for every patient.  Carefully clean hospital rooms and medical equipment.  Use Contact Precautions when caring for patients with MRSA. Contact Precautions mean:  Whenever possible, patients with MRSA will have a single room or will share a room only with someone else who also has MRSA.  Healthcare providers will put on gloves and wear a gown over their clothing while taking care of patients with MRSA.  Visitors may also be asked to wear a gown and gloves.  When leaving the room,  hospital providers and visitors remove their gown and gloves and clean their hands.  Patients on Contact Precautions are asked to stay in their hospital rooms as much as possible. They should not go to common areas, such as the gift shop or cafeteria. They may go to other areas of the hospital for treatments and tests.  May test some patients to see if they have MRSA on their skin. This test involves rubbing a cotton-tipped swab in the patient's nostrils or on the skin. What can I do to help prevent MRSA infections? In the hospital  Make sure that all doctors, nurses, and other healthcare providers clean their hands with soap and water or an alcohol-based hand rub before and after caring for you. If you do not see your providers clean their hands, please ask them to do so. When you go home  If you have wounds or an intravascular device (such as a catheter or dialysis port) make sure that you know how to take care of them. Can my friends and family get MRSA when they visit me? The chance of getting MRSA while visiting a person who has MRSA is very low. To decrease the chance of getting MRSA your family and friends should:  Clean their hands before they enter your room  and when they leave.  Ask a healthcare provider if they need to wear protective gowns and gloves when they visit you. What do I need to do when I go home from the hospital? To prevent another MRSA infection and to prevent spreading MRSA to others:  Keep taking any antibiotics prescribed by your doctor. Don't take half-doses or stop before you complete your prescribed course.  Clean your hands often, especially before and after changing your wound dressing or bandage.  People who live with you should clean their hands often as well.  Keep any wounds clean and change bandages as instructed until healed.  Avoid sharing personal items such as towels or razors.  Wash and dry your clothes and bed linens in the warmest  temperatures recommended on the labels.  Tell your healthcare providers that you have MRSA. This includes home health nurses and aides, therapists, and personnel in doctors' offices.  Your doctor may have more instructions for you. If you have questions, please ask your doctor or nurse. Developed and co-sponsored by [employer] for [place] [phone]); Infectious Diseases Society of [place] (IDSA); [place]; Association for Professionals in Infection Control and Epidemiology (APIC); Centers for Disease Control and Prevention (CDC); and [employer]. Document Released: 06/14/2013 Document Reviewed: 06/14/2013 [facility] Patient Information 2015 [place], [place]. This information is not intended to replace advice given to you by your health care provider. Make sure you discuss any questions you have with your health care provider.

## 2015-03-29 ENCOUNTER — Other Ambulatory Visit: Payer: Self-pay | Admitting: Family Medicine

## 2015-04-13 ENCOUNTER — Encounter: Payer: Self-pay | Admitting: Obstetrics and Gynecology

## 2015-04-13 ENCOUNTER — Other Ambulatory Visit (HOSPITAL_COMMUNITY)
Admission: RE | Admit: 2015-04-13 | Discharge: 2015-04-13 | Disposition: A | Discharge status: Home / Self Care | Payer: BLUE CROSS/BLUE SHIELD | Source: Ambulatory Visit | Attending: Family Medicine | Admitting: Family Medicine

## 2015-04-13 ENCOUNTER — Ambulatory Visit (INDEPENDENT_AMBULATORY_CARE_PROVIDER_SITE_OTHER): Payer: BLUE CROSS/BLUE SHIELD | Admitting: Obstetrics and Gynecology

## 2015-04-13 VITALS — BP 126/80 | HR 74 | Temp 98.1°F | Ht 63.0 in | Wt 191.3 lb

## 2015-04-13 DIAGNOSIS — N9489 Other specified conditions associated with female genital organs and menstrual cycle: Secondary | ICD-10-CM | POA: Diagnosis not present

## 2015-04-13 DIAGNOSIS — R109 Unspecified abdominal pain: Secondary | ICD-10-CM | POA: Diagnosis not present

## 2015-04-13 DIAGNOSIS — N760 Acute vaginitis: Secondary | ICD-10-CM

## 2015-04-13 DIAGNOSIS — Z113 Encounter for screening for infections with a predominantly sexual mode of transmission: Secondary | ICD-10-CM | POA: Diagnosis present

## 2015-04-13 DIAGNOSIS — N898 Other specified noninflammatory disorders of vagina: Secondary | ICD-10-CM | POA: Diagnosis not present

## 2015-04-13 DIAGNOSIS — A499 Bacterial infection, unspecified: Secondary | ICD-10-CM

## 2015-04-13 DIAGNOSIS — B9689 Other specified bacterial agents as the cause of diseases classified elsewhere: Secondary | ICD-10-CM

## 2015-04-13 LAB — POCT URINALYSIS DIPSTICK
BILIRUBIN UA: NEGATIVE
Blood, UA: NEGATIVE
GLUCOSE UA: NEGATIVE
KETONES UA: NEGATIVE
Leukocytes, UA: NEGATIVE
Nitrite, UA: NEGATIVE
PROTEIN UA: NEGATIVE
SPEC GRAV UA: 1.02
Urobilinogen, UA: 0.2
pH, UA: 8

## 2015-04-13 LAB — POCT WET PREP (WET MOUNT): CLUE CELLS WET PREP WHIFF POC: POSITIVE

## 2015-04-13 MED ORDER — METRONIDAZOLE 500 MG PO TABS: 500.0000 mg | ORAL_TABLET | Freq: Two times a day (BID) | ORAL | Status: DC

## 2015-04-13 NOTE — Patient Instructions (Addendum)
   Will contact you about STD testing  Take Flagyl medication as prescribed. NO ALCOHOL USE while on medication  Use over the counter pain medications such as ibuprofen and tylenol for pain relief of cramping (ibuprofen 600 or tylenol 500)  Urine was normal  Bacterial Vaginosis Bacterial vaginosis is an infection of the vagina. It happens when too many germs (bacteria) grow in the vagina. Having this infection puts you at risk for getting other infections from sex. Treating this infection can help lower your risk for other infections, such as:   Chlamydia.  Gonorrhea.  HIV.  Herpes. HOME CARE  Take your medicine as told by your doctor.  Finish your medicine even if you start to feel better.  Tell your sex partner that you have an infection. They should see their doctor for treatment.  During treatment:  Avoid sex or use condoms correctly.  Do not douche.  Do not drink alcohol unless your doctor tells you it is ok.  Do not breastfeed unless your doctor tells you it is ok. GET HELP IF:  You are not getting better after 3 days of treatment.  You have more grey fluid (discharge) coming from your vagina than before.  You have more pain than before.  You have a fever. MAKE SURE YOU:   Understand these instructions.  Will watch your condition.  Will get help right away if you are not doing well or get worse.   This information is not intended to replace advice given to you by your health care provider. Make sure you discuss any questions you have with your health care provider.   Document Released: 03/18/2008 Document Revised: 06/30/2014 Document Reviewed: 01/19/2013 Elsevier Interactive Patient Education [employer].

## 2015-04-13 NOTE — Progress Notes (Signed)
Subjective:   Patient ID: Candice [name], female    DOB: July 04, 1985, 29 y.o.   MRN: [id number]  Patient presents for Same Day Appointment  Chief Complaint  Patient presents with  . Vaginal Symptoms    HPI: # VAGINAL DISCHARGE  Having vaginal discharge for 1 week Medications tried: none Discharge consistency: thin Discharge color: white Recent antibiotic use: no Sex in last month: yes Possible STD exposure: yes; unproected intercourse H/o herpes, PID, trichomonas  Associated odor, pruritis, and abdominal/back cramping Birth control: tubal ligation  Symptoms Fever: no Dysuria: no Vaginal bleeding: no Abdomen or Pelvic pain:  Yes, intermittent Back pain: yes Genital sores or ulcers: no Rash: no Pain during sex: no Missed menstrual period: no  ROS see HPI Smoking Status noted  Objective:  BP 126/80 mmHg  Pulse 74  Temp(Src) 98.1 F (36.7 C) (Oral)  Ht 5\' 3"  (1.6 m)  Wt 191 lb 4.8 oz (86.773 kg)  BMI 33.90 kg/m2  LMP 04/02/2015 Vitals and nursing note reviewed  Physical Exam  Constitutional: She is well-developed, well-nourished, and in no distress.  Abdominal: Soft. Normal appearance and bowel sounds are normal. There is tenderness in the right lower quadrant. There is no guarding and no CVA tenderness.  Genitourinary: Uterus normal, cervix normal, right adnexa normal and left adnexa normal. Cervix exhibits no motion tenderness. Thin  fishy  white and vaginal discharge found.  No vaginal or cervical lesions    Results for orders placed or performed in visit on 04/13/15 (from the past 24 hour(s))  POCT Wet Prep [facility] [place])     Status: Abnormal   Collection Time: 04/13/15 11:23 AM  Result Value Ref Range   Source Wet Prep POC vaginal    WBC, Wet Prep HPF POC 1-5    Bacteria Wet Prep HPF POC Moderate (A) None, Few, Too numerous to count   Clue Cells Wet Prep HPF POC Many (A) None, Too numerous to count   Clue Cells Wet Prep Whiff POC Positive Whiff    Yeast Wet Prep HPF POC None    Trichomonas Wet Prep HPF POC none   POCT urinalysis dipstick     Status: None   Collection Time: 04/13/15 11:23 AM  Result Value Ref Range   Color, UA yellow    Clarity, UA clear    Glucose, UA neg    Bilirubin, UA neg    Ketones, UA neg    Spec Grav, UA 1.020    Blood, UA neg    pH, UA 8.0    Protein, UA neg    Urobilinogen, UA 0.2    Nitrite, UA neg    Leukocytes, UA Negative Negative    Assessment & Plan:  1. Vaginal discharge with associated odor and cramping: symptoms and exam consistent with diagnosis of bacterial vaginosis. Patient has had this before. Wet prep consistent with BV. UA was unremarkable for UTI. Will rule out pyelonephritis and kidney stones as being cause of back/abdominal pain. Pelvic exam was negative for cervical motion tenderness; PID not high on differential.   -Prescription for Flagyl given for 7 day course; dicussed avoidance of alcohol with medication and side effects -STD testing ordered and pending -Handout given -discussed genital hygiene and causes of BV -return precautions given   Orders Placed This Encounter  Procedures  . POCT Wet Prep [employer])  . POCT urinalysis dipstick   Meds ordered this encounter  Medications  . metroNIDAZOLE (FLAGYL) 500 MG tablet    Sig:  Take 1 tablet (500 mg total) by mouth 2 (two) times daily.    Dispense:  14 tablet    Refill:  [place], DO 04/13/2015, 11:09 AM PGY-2, [place]

## 2015-04-16 LAB — CERVICOVAGINAL ANCILLARY ONLY
CHLAMYDIA, DNA PROBE: NEGATIVE
Neisseria Gonorrhea: NEGATIVE

## 2015-05-26 ENCOUNTER — Emergency Department (INDEPENDENT_AMBULATORY_CARE_PROVIDER_SITE_OTHER)
Admission: EM | Admit: 2015-05-26 | Discharge: 2015-05-26 | Disposition: A | Discharge status: Home / Self Care | Payer: BLUE CROSS/BLUE SHIELD | Source: Home / Self Care | Attending: Family Medicine | Admitting: Family Medicine

## 2015-05-26 ENCOUNTER — Encounter (HOSPITAL_COMMUNITY): Payer: Self-pay | Admitting: *Deleted

## 2015-05-26 DIAGNOSIS — B356 Tinea cruris: Secondary | ICD-10-CM

## 2015-05-26 LAB — POCT URINALYSIS DIP (DEVICE)
Bilirubin Urine: NEGATIVE
Glucose, UA: NEGATIVE mg/dL
Ketones, ur: NEGATIVE mg/dL
Leukocytes, UA: NEGATIVE
Nitrite: NEGATIVE
Protein, ur: NEGATIVE mg/dL
Specific Gravity, Urine: 1.025 (ref 1.005–1.030)
Urobilinogen, UA: 0.2 mg/dL (ref 0.0–1.0)
pH: 7 (ref 5.0–8.0)

## 2015-05-26 LAB — POCT PREGNANCY, URINE: Preg Test, Ur: NEGATIVE

## 2015-05-26 MED ORDER — TERBINAFINE HCL 1 % EX CREA: 1.0000 "application " | TOPICAL_CREAM | Freq: Every day | CUTANEOUS | Status: DC

## 2015-05-26 MED ORDER — TERBINAFINE HCL 250 MG PO TABS: 250.0000 mg | ORAL_TABLET | Freq: Every day | ORAL | Status: DC

## 2015-05-26 NOTE — ED Notes (Signed)
Pt  Reports    Rash   And   Irritation  X  2  Weeks   Some  White discharge       As  Well  As   Some  Low  abd  Cramping

## 2015-05-26 NOTE — ED Provider Notes (Signed)
CSN: [id number]     Arrival date & time 05/26/15  1508 History   First MD Initiated Contact with Patient 05/26/15 1613     Chief Complaint  Patient presents with  . Vaginal Itching   (Consider location/radiation/quality/duration/timing/severity/associated sxs/prior Treatment) Patient is a 29 y.o. female presenting with vaginal itching. The history is provided by the patient.  Vaginal Itching This is a new problem. The current episode started more than 1 week ago (pruritic x 2 wks.). The problem has not changed since onset.Pertinent negatives include no abdominal pain. Nothing aggravates the symptoms. Nothing relieves the symptoms.    Past Medical History  Diagnosis Date  . Depression   . HSV 06/28/2007  . Complication of anesthesia     epidural with last pregnancy made entire left side numb, had to d/c  . Headache   . Hx of migraines    Past Surgical History  Procedure Laterality Date  . Cesarean section    . Iud removal  2005    in cervix  . Cesarean section  09/07/2011    Procedure: CESAREAN SECTION;  Surgeon: Florian Buff, MD;  Location: [facility];  Service: Gynecology;  Laterality: N/A;  Repeat cesarean section with delivery of baby boy at 61. Apgars 9/9.  Bilateral tubal ligation.  . Tubal ligation     Family History  Problem Relation Age of Onset  . Alcohol abuse Mother   . Drug abuse Mother   . Depression Mother   . Alcohol abuse Father   . Drug abuse Father   . Mental illness Sister     bipolar ptsd  . Schizophrenia Sister   . Diabetes Maternal Grandmother   . Hypertension Maternal Grandmother   . Cancer Maternal Grandmother     ovarian, breast and lung  . Depression Maternal Grandmother    Social History  Substance Use Topics  . Smoking status: Current Every Day Smoker -- 0.25 packs/day    Types: Cigarettes  . Smokeless tobacco: Never Used  . Alcohol Use: 0.0 oz/week     Comment: occasional   OB History    Gravida Para Term Preterm AB TAB SAB Ectopic  Multiple Living   2 2 2  0 0 0 0 0 0 2     Review of Systems  Gastrointestinal: Negative.  Negative for abdominal pain.  Genitourinary: Negative.  Negative for vaginal bleeding, vaginal discharge and vaginal pain.  Skin: Positive for rash.  All other systems reviewed and are negative.   Allergies  Review of patient's allergies indicates no known allergies.  Home Medications   Prior to Admission medications   Medication Sig Start Date End Date Taking? Authorizing Provider  fluconazole (DIFLUCAN) 150 MG tablet Take 1 tablet one time at the first sign of yeast infection. Repeat in 7 days if symptoms do not clear. Patient not taking: Reported on 10/27/2014 03/16/14   Sharon Mt Street, MD  ibuprofen (ADVIL,MOTRIN) 200 MG tablet Take 400 mg by mouth every 6 (six) hours as needed for headache, mild pain or moderate pain.    Historical Provider, MD  metroNIDAZOLE (FLAGYL) 500 MG tablet Take 1 tablet (500 mg total) by mouth 2 (two) times daily. 04/13/15   Katheren Shams, DO  mupirocin ointment (BACTROBAN) 2 % Place 1 application into the nose 2 (two) times daily. For 7 days. Patient not taking: Reported on 10/27/2014 09/25/14   Sharon Mt Street, MD  terbinafine (LAMISIL) 1 % cream Apply 1 application topically daily. 05/26/15   Jeneen Rinks  Damian Leavell, MD  terbinafine (LAMISIL) 250 MG tablet Take 1 tablet (250 mg total) by mouth daily. 05/26/15   Billy Fischer, MD  valACYclovir (VALTREX) 500 MG tablet Take 1 tablet (500 mg) by mouth twice a day for three days at the first sign of an outbreak. 02/09/15   Zenia Resides, MD   Meds Ordered and Administered this Visit  Medications - No data to display  LMP 05/03/2015 No data found.   Physical Exam  Constitutional: She is oriented to person, place, and time. She appears well-developed and well-nourished. No distress.  HENT:  Head: Normocephalic.  Abdominal: Soft. Bowel sounds are normal. There is no tenderness.  Neurological: She is alert and oriented  to person, place, and time.  Skin: Skin is warm and dry. Rash noted.  Hypopigmented scaly dermatitis at inguinalskin folds and c-section scar across abd.  Nursing note and vitals reviewed.   ED Course  Procedures (including critical care time)  Labs Review Labs Reviewed  POCT URINALYSIS DIP (DEVICE) - Abnormal; Notable for the following:    Hgb urine dipstick TRACE (*)    All other components within normal limits  POCT PREGNANCY, URINE    Imaging Review No results found.   Visual Acuity Review  Right Eye Distance:   Left Eye Distance:   Bilateral Distance:    Right Eye Near:   Left Eye Near:    Bilateral Near:         MDM   1. Tinea cruris        Billy Fischer, MD 05/26/15 2016

## 2015-05-26 NOTE — ED Notes (Signed)
At bedside for exam with dr Juventino Slovak

## 2015-05-26 NOTE — Discharge Instructions (Signed)
Use medicine as prescribed and see your doctor if further problems °

## 2015-07-13 ENCOUNTER — Other Ambulatory Visit (HOSPITAL_COMMUNITY)
Admission: RE | Admit: 2015-07-13 | Discharge: 2015-07-13 | Disposition: A | Discharge status: Home / Self Care | Payer: BLUE CROSS/BLUE SHIELD | Source: Ambulatory Visit | Attending: Family Medicine | Admitting: Family Medicine

## 2015-07-13 ENCOUNTER — Ambulatory Visit (INDEPENDENT_AMBULATORY_CARE_PROVIDER_SITE_OTHER): Payer: BLUE CROSS/BLUE SHIELD | Admitting: Family Medicine

## 2015-07-13 VITALS — BP 134/77 | HR 100 | Temp 98.5°F | Resp 18 | Ht 62.5 in | Wt 183.5 lb

## 2015-07-13 DIAGNOSIS — M545 Low back pain, unspecified: Secondary | ICD-10-CM

## 2015-07-13 DIAGNOSIS — Z113 Encounter for screening for infections with a predominantly sexual mode of transmission: Secondary | ICD-10-CM | POA: Insufficient documentation

## 2015-07-13 DIAGNOSIS — N898 Other specified noninflammatory disorders of vagina: Secondary | ICD-10-CM | POA: Insufficient documentation

## 2015-07-13 DIAGNOSIS — R350 Frequency of micturition: Secondary | ICD-10-CM | POA: Diagnosis not present

## 2015-07-13 LAB — POCT URINALYSIS DIPSTICK
BILIRUBIN UA: NEGATIVE
Blood, UA: NEGATIVE
GLUCOSE UA: NEGATIVE
Leukocytes, UA: NEGATIVE
NITRITE UA: NEGATIVE
Protein, UA: NEGATIVE
Spec Grav, UA: >=1.03
Urobilinogen, UA: 0.2
pH, UA: 6

## 2015-07-13 LAB — POCT WET PREP (WET MOUNT): Clue Cells Wet Prep Whiff POC: NEGATIVE

## 2015-07-13 LAB — HIV ANTIBODY (ROUTINE TESTING W REFLEX): HIV 1&2 Ab, 4th Generation: NONREACTIVE

## 2015-07-13 NOTE — Patient Instructions (Signed)
There was no evidence of infection on your wet prep The other STD panel is still pending, our office will contact you. For you back pain for now, use a heating pad and work on stretches  If your back pain changes or you develop new symptoms, come back and see [place]. Our office will contact you with the results of your other testing by Thursday of next week- if you do not hear from [place] call our clinic.

## 2015-07-13 NOTE — Progress Notes (Signed)
Patient ID: [name], female   DOB: April 01, 1986, 30 y.o.   MRN: [id number]    Subjective: CC: vaginal discharge HPI: Patient is a 30 y.o. female with a past medical history of folliculitis, genital herpes presenting to clinic today for a same day appt with multiple complaints including knows in her arm and fingers for several months, vaginal discharge with concerns for BV, and back pain; she opts to discuss vaginal discharge today and f/u in a regularly scheduled appt to discuss other things.   Clear vaginal discharge for 1 month that is malodorous. Has a h/o BV and is concerned that she has it again.  Also has "pain in ovaries and back". She started having intermenstrual spotting as well which is unusual. Has urinary urgency.  No dysuria or pruritus. No fevers or chills.  LMP 06/25/15, BTL. Pain not associated with period. Patient would like STD screening as well. Taking Tylenol and Ibuprofen that helps with the "ovary pain."   Of note, she was seen in the ED on 05/2015 for vaginal itching and was diagnosed with tinea cruris. She took Lamisil 250mg  which resolved the symptoms.   Social History:  Current smoker.    ROS: All other systems reviewed and are negative except that noted in HPI.  Past Medical History Patient Active Problem List   Diagnosis Date Noted  . Vaginal discharge 07/13/2015  . MRSA colonization 09/25/2014  . Musculoskeletal pain 09/25/2014  . Folliculitis [id number]  . Major depressive disorder, recurrent episode, moderate ([place]) 06/08/2013  . Genital herpes 02/16/2013  . Back pain 10/13/2011  . TOBACCO USER 03/31/2009  . OBESITY, NOS 08/20/2006    Medications- reviewed and updated Current Outpatient Prescriptions  Medication Sig Dispense Refill  . ibuprofen (ADVIL,MOTRIN) 200 MG tablet Take 400 mg by mouth every 6 (six) hours as needed for headache, mild pain or moderate pain.    . mupirocin ointment (BACTROBAN) 2 % Place 1 application into the nose 2 (two)  times daily. For 7 days. (Patient not taking: Reported on 10/27/2014) 30 g 1  . terbinafine (LAMISIL) 1 % cream Apply 1 application topically daily. 36 g 1  . terbinafine (LAMISIL) 250 MG tablet Take 1 tablet (250 mg total) by mouth daily. 10 tablet 0  . valACYclovir (VALTREX) 500 MG tablet Take 1 tablet (500 mg) by mouth twice a day for three days at the first sign of an outbreak. 6 tablet 6   No current facility-administered medications for this visit.    Objective: Office vital signs reviewed. BP 134/77 mmHg  Pulse 100  Temp(Src) 98.5 F (36.9 C) (Oral)  Resp 18  Ht 5' 2.5" (1.588 m)  Wt 183 lb 8 oz (83.235 kg)  BMI 33.01 kg/m2  SpO2 100%   Physical Examination:  General: Awake, alert, well- nourished, NAD GI: soft, non-distended. Tenderness to palpation in the lower quadrants L>R. No rebound or guarding. No HSM.  GYN:  External genitalia within normal limits.  Vaginal mucosa pink, moist, normal rugae.  Nonfriable cervix without lesions, no discharge or bleeding noted on speculum exam.  Bimanual exam revealed normal, nongravid uterus.  No cervical motion tenderness. No adnexal masses bilaterally.    Assessment/Plan: Vaginal discharge Patient presenting with clear vaginal discharge and suprapubic pain x 1 month. Pelvic exam unremarkable; suspect vaginal discharge is not pathologic.  Wet prep negative.  Pain could be from ovarian cysts. Could also consider discussing bowel habits in the future if there is no improvement given pain was mostly L  sided. - GC/chlamydia, HIV, and RPR ordered  - discussed RTC precautions: fevers, chills, back pain, change in discharge.     Orders Placed This Encounter  Procedures  . HIV antibody (with reflex)  . RPR  . POCT Wet Prep [employer])  . POCT urinalysis dipstick    No orders of the defined types were placed in this encounter.    Archie Patten PGY-2, [place]

## 2015-07-13 NOTE — Assessment & Plan Note (Signed)
Patient presenting with clear vaginal discharge and suprapubic pain x 1 month. Pelvic exam unremarkable; suspect vaginal discharge is not pathologic.  Wet prep negative.  Pain could be from ovarian cysts. Could also consider discussing bowel habits in the future if there is no improvement given pain was mostly L sided. - GC/chlamydia, HIV, and RPR ordered  - discussed RTC precautions: fevers, chills, back pain, change in discharge.

## 2015-07-13 NOTE — Progress Notes (Signed)
Patient is here for concerns of knots in her right arm and ring finger related to similar symptom in her foot and leg previously treated with steroid pack (per patient)

## 2015-07-14 LAB — RPR

## 2015-07-16 ENCOUNTER — Telehealth: Payer: Self-pay | Admitting: Family Medicine

## 2015-07-16 LAB — CERVICOVAGINAL ANCILLARY ONLY
Chlamydia: NEGATIVE
Neisseria Gonorrhea: NEGATIVE

## 2015-07-16 NOTE — Telephone Encounter (Signed)
Please call and let the patient know that all her tests (syphillis, HIV, gonorrhea, and chlamydia) were negative.  Thanks, Archie Patten, MD [facility] Resident  07/16/2015, 7:02 PM

## 2015-07-17 NOTE — Telephone Encounter (Signed)
Left message on voicemail for patient to call back. 

## 2015-07-18 NOTE — Telephone Encounter (Signed)
Left another message for patient to call back 

## 2015-07-18 NOTE — Telephone Encounter (Signed)
Patient informed of negative labs.

## 2015-08-01 NOTE — Telephone Encounter (Signed)
Opened in error

## 2015-10-18 ENCOUNTER — Encounter: Payer: Self-pay | Admitting: Family Medicine

## 2015-10-18 ENCOUNTER — Ambulatory Visit (INDEPENDENT_AMBULATORY_CARE_PROVIDER_SITE_OTHER): Payer: BLUE CROSS/BLUE SHIELD | Admitting: Family Medicine

## 2015-10-18 VITALS — BP 123/70 | HR 85 | Temp 98.3°F | Ht 63.0 in | Wt 178.8 lb

## 2015-10-18 DIAGNOSIS — N898 Other specified noninflammatory disorders of vagina: Secondary | ICD-10-CM | POA: Diagnosis not present

## 2015-10-18 DIAGNOSIS — A499 Bacterial infection, unspecified: Secondary | ICD-10-CM

## 2015-10-18 DIAGNOSIS — B9689 Other specified bacterial agents as the cause of diseases classified elsewhere: Secondary | ICD-10-CM

## 2015-10-18 DIAGNOSIS — N760 Acute vaginitis: Secondary | ICD-10-CM | POA: Diagnosis not present

## 2015-10-18 LAB — POCT URINALYSIS DIPSTICK
BILIRUBIN UA: NEGATIVE
GLUCOSE UA: NEGATIVE
KETONES UA: NEGATIVE
Leukocytes, UA: NEGATIVE
Nitrite, UA: NEGATIVE
Protein, UA: NEGATIVE
RBC UA: NEGATIVE
Spec Grav, UA: 1.02
Urobilinogen, UA: 1
pH, UA: 7.5

## 2015-10-18 LAB — POCT WET PREP (WET MOUNT): CLUE CELLS WET PREP WHIFF POC: POSITIVE

## 2015-10-18 MED ORDER — METRONIDAZOLE 500 MG PO TABS: 500.0000 mg | ORAL_TABLET | Freq: Two times a day (BID) | ORAL | Status: DC

## 2015-10-18 NOTE — Progress Notes (Signed)
   Subjective:    Patient ID: Candice [name], female    DOB: Jun 13, 1986, 30 y.o.   MRN: [id number]  Seen for Same day visit for   CC: vaginal discharge  VAGINAL DISCHARGE  Having vaginal discharge for 7 days. Medications tried: none Discharge consistency: thick Discharge color: white Recent antibiotic use: no Sex in last month: yes Possible STD exposure: no  Symptoms Fever: no Dysuria:no Vaginal bleeding: yes - LMP last week Abdomen or Pelvic pain: no Back pain: yes - similar to previous back pain but lower Genital sores or ulcers:no Rash: no Pain during sex: no Missed menstrual period: no  ROS see HPI Smoking Status noted  Objective:  BP 123/70 mmHg  Pulse 85  Temp(Src) 98.3 F (36.8 C) (Oral)  Ht 5\' 3"  (1.6 m)  Wt 178 lb 12.8 oz (81.103 kg)  BMI 31.68 kg/m2  SpO2 100%  LMP 10/10/2015  General: NAD Cardiac: RRR, normal heart sounds, no murmurs. 2+ radial and PT pulses bilaterally Respiratory: CTAB, normal effort Abdomen: soft, nontender, nondistended, Bowel sounds present Speculum Exam: Ext genitalia: wnl; Vaginal discharge: thick/white; Cervix: wnl Bimanual Exam: No Cervical motion tenderness; No Vaginal wall defects; Adnexa: Right mild tenderness    Assessment & Plan:   1. Vaginal discharge - POCT Wet Prep [facility]) - Urinalysis Dipstick  2. BV (bacterial vaginosis) - metroNIDAZOLE (FLAGYL) 500 MG tablet; Take 1 tablet (500 mg total) by mouth 2 (two) times daily.  Dispense: 14 tablet; Refill: 0

## 2015-10-18 NOTE — Patient Instructions (Addendum)
You symptoms are due to Bacterial Vaginosis  - Take metronidazole 500 mg twice a day for 1 week  immediately report symptoms of aseptic meningitis, encephalopathy, or peripheral neuropathy   Side effects may include seizures, nausea, diarrhea, headaches, dizziness, metallic taste, rash, pruritus, or leukopenia (mild)   Avoid alcohol with drug due to disulfiram-like reaction     Bacterial Vaginosis Bacterial vaginosis is an infection of the vagina. It happens when too many germs (bacteria) grow in the vagina. Having this infection puts you at risk for getting other infections from sex. Treating this infection can help lower your risk for other infections, such as:   Chlamydia.  Gonorrhea.  HIV.  Herpes. HOME CARE  Take your medicine as told by your doctor.  Finish your medicine even if you start to feel better.  Tell your sex partner that you have an infection. They should see their doctor for treatment.  During treatment:  Avoid sex or use condoms correctly.  Do not douche.  Do not drink alcohol unless your doctor tells you it is ok.  Do not breastfeed unless your doctor tells you it is ok. GET HELP IF:  You are not getting better after 3 days of treatment.  You have more grey fluid (discharge) coming from your vagina than before.  You have more pain than before.  You have a fever. MAKE SURE YOU:   Understand these instructions.  Will watch your condition.  Will get help right away if you are not doing well or get worse.   This information is not intended to replace advice given to you by your health care provider. Make sure you discuss any questions you have with your health care provider.   Document Released: 03/18/2008 Document Revised: 06/30/2014 Document Reviewed: 01/19/2013 Elsevier Interactive Patient Education [employer].

## 2016-02-14 ENCOUNTER — Encounter (HOSPITAL_COMMUNITY): Payer: Self-pay | Admitting: Emergency Medicine

## 2016-02-14 DIAGNOSIS — F17210 Nicotine dependence, cigarettes, uncomplicated: Secondary | ICD-10-CM | POA: Insufficient documentation

## 2016-02-14 DIAGNOSIS — Z791 Long term (current) use of non-steroidal anti-inflammatories (NSAID): Secondary | ICD-10-CM | POA: Diagnosis not present

## 2016-02-14 DIAGNOSIS — J40 Bronchitis, not specified as acute or chronic: Secondary | ICD-10-CM | POA: Diagnosis not present

## 2016-02-14 DIAGNOSIS — N83299 Other ovarian cyst, unspecified side: Secondary | ICD-10-CM | POA: Diagnosis not present

## 2016-02-14 DIAGNOSIS — R1030 Lower abdominal pain, unspecified: Secondary | ICD-10-CM | POA: Diagnosis present

## 2016-02-14 LAB — POC URINE PREG, ED: PREG TEST UR: NEGATIVE

## 2016-02-14 NOTE — ED Triage Notes (Signed)
Pt c/o lower abdominal pain x a week and a half. Pt c/o white vaginal discharge with a foul odor and sts "I got a lot of itchy itchy."  Pt also c/o lower back pain up into her L flank area. Pt denies any urinary symptoms. A&Ox4 and ambulatory. Pt would like to be checked for STIs.

## 2016-02-15 ENCOUNTER — Emergency Department (HOSPITAL_COMMUNITY): Payer: BLUE CROSS/BLUE SHIELD

## 2016-02-15 ENCOUNTER — Emergency Department (HOSPITAL_COMMUNITY)
Admission: EM | Admit: 2016-02-15 | Discharge: 2016-02-15 | Disposition: A | Discharge status: Home / Self Care | Payer: BLUE CROSS/BLUE SHIELD | Attending: Emergency Medicine | Admitting: Emergency Medicine

## 2016-02-15 DIAGNOSIS — R102 Pelvic and perineal pain: Secondary | ICD-10-CM

## 2016-02-15 DIAGNOSIS — J40 Bronchitis, not specified as acute or chronic: Secondary | ICD-10-CM

## 2016-02-15 DIAGNOSIS — N83209 Unspecified ovarian cyst, unspecified side: Secondary | ICD-10-CM

## 2016-02-15 LAB — CBC
HEMATOCRIT: 37 % (ref 36.0–46.0)
Hemoglobin: 12.1 g/dL (ref 12.0–15.0)
MCH: 26.8 pg (ref 26.0–34.0)
MCHC: 32.7 g/dL (ref 30.0–36.0)
MCV: 82 fL (ref 78.0–100.0)
Platelets: 268 10*3/uL (ref 150–400)
RBC: 4.51 MIL/uL (ref 3.87–5.11)
RDW: 15.7 % — AB (ref 11.5–15.5)
WBC: 12 10*3/uL — ABNORMAL HIGH (ref 4.0–10.5)

## 2016-02-15 LAB — WET PREP, GENITAL
Clue Cells Wet Prep HPF POC: NONE SEEN
Sperm: NONE SEEN
Trich, Wet Prep: NONE SEEN
Yeast Wet Prep HPF POC: NONE SEEN

## 2016-02-15 LAB — COMPREHENSIVE METABOLIC PANEL
ALBUMIN: 4 g/dL (ref 3.5–5.0)
ALT: 16 U/L (ref 14–54)
ANION GAP: 4 — AB (ref 5–15)
AST: 17 U/L (ref 15–41)
Alkaline Phosphatase: 41 U/L (ref 38–126)
BILIRUBIN TOTAL: 0.5 mg/dL (ref 0.3–1.2)
BUN: 16 mg/dL (ref 6–20)
CO2: 26 mmol/L (ref 22–32)
Calcium: 9.1 mg/dL (ref 8.9–10.3)
Chloride: 107 mmol/L (ref 101–111)
Creatinine, Ser: 0.78 mg/dL (ref 0.44–1.00)
GFR calc Af Amer: >60 mL/min (ref >60)
Glucose, Bld: 88 mg/dL (ref 65–99)
POTASSIUM: 3.9 mmol/L (ref 3.5–5.1)
Sodium: 137 mmol/L (ref 135–145)
TOTAL PROTEIN: 7.1 g/dL (ref 6.5–8.1)

## 2016-02-15 LAB — URINALYSIS, ROUTINE W REFLEX MICROSCOPIC
BILIRUBIN URINE: NEGATIVE
GLUCOSE, UA: NEGATIVE mg/dL
HGB URINE DIPSTICK: NEGATIVE
Ketones, ur: NEGATIVE mg/dL
Leukocytes, UA: NEGATIVE
Nitrite: NEGATIVE
PROTEIN: NEGATIVE mg/dL
Specific Gravity, Urine: 1.022 (ref 1.005–1.030)
pH: 6.5 (ref 5.0–8.0)

## 2016-02-15 LAB — I-STAT TROPONIN, ED: TROPONIN I, POC: 0 ng/mL (ref 0.00–0.08)

## 2016-02-15 LAB — GC/CHLAMYDIA PROBE AMP (~~LOC~~) NOT AT ARMC
CHLAMYDIA, DNA PROBE: NEGATIVE
NEISSERIA GONORRHEA: NEGATIVE

## 2016-02-15 LAB — HIV ANTIBODY (ROUTINE TESTING W REFLEX): HIV Screen 4th Generation wRfx: NONREACTIVE

## 2016-02-15 MED ORDER — DEXTROSE 5 % IV SOLN
1.0000 g | Freq: Once | INTRAVENOUS | Status: AC
  Administered 2016-02-15: 1 g via INTRAVENOUS
  Filled 2016-02-15: qty 10

## 2016-02-15 MED ORDER — AZITHROMYCIN 250 MG PO TABS
1000.0000 mg | ORAL_TABLET | Freq: Once | ORAL | Status: AC
  Administered 2016-02-15: 1000 mg via ORAL
  Filled 2016-02-15: qty 4

## 2016-02-15 MED ORDER — AZITHROMYCIN 250 MG PO TABS: 250.0000 mg | ORAL_TABLET | Freq: Every day | ORAL | 0 refills | Status: DC

## 2016-02-15 MED ORDER — SODIUM CHLORIDE 0.9 % IV BOLUS (SEPSIS)
1000.0000 mL | Freq: Once | INTRAVENOUS | Status: AC
  Administered 2016-02-15: 1000 mL via INTRAVENOUS

## 2016-02-15 MED ORDER — MORPHINE SULFATE (PF) 4 MG/ML IV SOLN
4.0000 mg | Freq: Once | INTRAVENOUS | Status: AC
  Administered 2016-02-15: 4 mg via INTRAVENOUS
  Filled 2016-02-15: qty 1

## 2016-02-15 NOTE — ED Provider Notes (Signed)
[facility] DEPT Provider Note   CSN: [id number] Arrival date & time: 02/14/16  2214  By signing my name below, I, Gwenlyn Fudge, attest that this documentation has been prepared under the direction and in the presence of Shary Decamp, PA-C. Electronically Signed: Gwenlyn Fudge, ED Scribe. 02/15/16. 1:03 AM.  History   Chief Complaint Chief Complaint  Patient presents with  . Abdominal Pain  . Vaginal Discharge   The history is provided by the patient. No language interpreter was used.    HPI Comments: Candice [name] is a 30 y.o. female with who presents to the Emergency Department complaining of central, lower 7/10 abdominal pain onset 10 days ago. Pt reports associated lower back pain and vaginal discharge. Pt states lower back pain radiates upwards to her neck. Pt states she is sexually active and does not use protection. Pt reports white vaginal discharge that she has been using a yeast infection cream for. Pt also complains of gradual onset, intermittent left sided chest pain onset 2 days. Pt reports associated shortness of breath, left hand/arm numbness, nausea, chills and diaphoresis. Pt states her chest pain is brought on at rest.Duration x 1 hour.  Pt states she has been taking Ibuprofen which temporarily relieves chest pain. Pt denies cardiac medical hx, pulmonary medical hx, HTN, DVT/PE, recent surgeries. Pt denies vomiting, bowel or bladder incontinence, burning sensation while urinating, leg numbness, fever.   Past Medical History:  Diagnosis Date  . Complication of anesthesia    epidural with last pregnancy made entire left side numb, had to d/c  . Depression   . Headache   . HSV 06/28/2007  . Hx of migraines     Patient Active Problem List   Diagnosis Date Noted  . Vaginal discharge 07/13/2015  . MRSA colonization 09/25/2014  . Musculoskeletal pain 09/25/2014  . Folliculitis [id number]  . Major depressive disorder, recurrent episode, moderate ([place]) 06/08/2013  .  Genital herpes 02/16/2013  . Back pain 10/13/2011  . TOBACCO USER 03/31/2009  . OBESITY, NOS 08/20/2006    Past Surgical History:  Procedure Laterality Date  . CESAREAN SECTION    . CESAREAN SECTION  09/07/2011   Procedure: CESAREAN SECTION;  Surgeon: Florian Buff, MD;  Location: [facility];  Service: Gynecology;  Laterality: N/A;  Repeat cesarean section with delivery of baby boy at 19. Apgars 9/9.  Bilateral tubal ligation.  . IUD REMOVAL  2005   in cervix  . TUBAL LIGATION      OB History    Gravida Para Term Preterm AB Living   2 2 2  0 0 2   SAB TAB Ectopic Multiple Live Births   0 0 0 0 2       Home Medications    Prior to Admission medications   Medication Sig Start Date End Date Taking? Authorizing Provider  ibuprofen (ADVIL,MOTRIN) 200 MG tablet Take 400 mg by mouth every 6 (six) hours as needed for headache, mild pain or moderate pain.   Yes Historical Provider, MD  metroNIDAZOLE (FLAGYL) 500 MG tablet Take 1 tablet (500 mg total) by mouth 2 (two) times daily. Patient not taking: Reported on 02/15/2016 10/18/15   Olam Idler, MD  mupirocin ointment (BACTROBAN) 2 % Place 1 application into the nose 2 (two) times daily. For 7 days. Patient not taking: Reported on 10/27/2014 09/25/14   Sharon [address], MD  terbinafine (LAMISIL) 1 % cream Apply 1 application topically daily. Patient not taking: Reported on 02/15/2016 05/26/15  Billy Fischer, MD  terbinafine (LAMISIL) 250 MG tablet Take 1 tablet (250 mg total) by mouth daily. Patient not taking: Reported on 02/15/2016 05/26/15   Billy Fischer, MD  valACYclovir (VALTREX) 500 MG tablet Take 1 tablet (500 mg) by mouth twice a day for three days at the first sign of an outbreak. Patient not taking: Reported on 02/15/2016 02/09/15   Zenia Resides, MD    Family History Family History  Problem Relation Age of Onset  . Alcohol abuse Mother   . Drug abuse Mother   . Depression Mother   . Alcohol abuse Father   . Drug abuse  Father   . Mental illness Sister     bipolar ptsd  . Schizophrenia Sister   . Diabetes Maternal Grandmother   . Hypertension Maternal Grandmother   . Cancer Maternal Grandmother     ovarian, breast and lung  . Depression Maternal Grandmother     Social History Social History  Substance Use Topics  . Smoking status: Current Every Day Smoker    Packs/day: 0.25    Types: Cigarettes  . Smokeless tobacco: Never Used  . Alcohol use 0.0 oz/week     Comment: occasional     Allergies   Review of patient's allergies indicates no known allergies.   Review of Systems Review of Systems 10 Systems reviewed and are negative for acute change except as noted in the HPI.  Physical Exam Updated Vital Signs BP 124/68 (BP Location: Left Arm)   Pulse 71   Temp 98.6 F (37 C) (Oral)   Resp 18   Ht 5\' 3"  (1.6 m)   LMP 01/31/2016   SpO2 98%   Physical Exam  Constitutional: She is oriented to person, place, and time. Vital signs are normal. She appears well-developed and well-nourished.  HENT:  Head: Normocephalic.  Right Ear: Hearing normal.  Left Ear: Hearing normal.  Eyes: Conjunctivae and EOM are normal. Pupils are equal, round, and reactive to light.  Neck: Normal range of motion. Neck supple.  Cardiovascular: Normal rate, regular rhythm, normal heart sounds and intact distal pulses.   Pulmonary/Chest: Effort normal and breath sounds normal. No respiratory distress. She has no wheezes. She has no rales.  Abdominal: Soft. She exhibits no distension.  Genitourinary: Pelvic exam was performed with patient supine.  Musculoskeletal: Normal range of motion.  Neurological: She is alert and oriented to person, place, and time. She has normal strength. No cranial nerve deficit or sensory deficit.  Cranial Nerves:  II: Pupils equal, round, reactive to light III,IV, VI: ptosis not present, extra-ocular motions intact bilaterally  V,VII: smile symmetric, facial light touch sensation  equal VIII: hearing grossly normal bilaterally  IX,X: midline uvula rise  XI: bilateral shoulder shrug equal and strong XII: midline tongue extension  Skin: Skin is warm and dry.  Psychiatric: She has a normal mood and affect. Her speech is normal and behavior is normal. Thought content normal.  Nursing note and vitals reviewed.  Exam performed by Ozella Rocks,  exam chaperoned Date: 02/15/2016 Pelvic exam: normal external genitalia without evidence of trauma. VULVA: normal appearing vulva with no masses, tenderness or lesion. VAGINA: normal appearing vagina with normal color and discharge, no lesions. CERVIX: normal appearing cervix without lesions, cervical motion tenderness absent, cervical os closed with out purulent discharge; vaginal discharge - clear and copious, Wet prep and DNA probe for chlamydia and GC obtained.   ADNEXA: normal adnexa in size, nontender and no masses UTERUS:  uterus is normal size, shape, consistency and nontender.    ED Treatments / Results  DIAGNOSTIC STUDIES: Oxygen Saturation is 98% on RA, normal by my interpretation.    COORDINATION OF CARE: 12:48 AM Discussed treatment plan with pt at bedside which includes DG Chest, Pelvic Exam and CBC and pt agreed to plan.  Labs (all labs ordered are listed, but only abnormal results are displayed) Labs Reviewed  WET PREP, GENITAL - Abnormal; Notable for the following:       Result Value   WBC, Wet Prep HPF POC FEW (*)    All other components within normal limits  URINALYSIS, ROUTINE W REFLEX MICROSCOPIC (NOT AT [facility]) - Abnormal; Notable for the following:    APPearance CLOUDY (*)    All other components within normal limits  CBC - Abnormal; Notable for the following:    WBC 12.0 (*)    RDW 15.7 (*)    All other components within normal limits  COMPREHENSIVE METABOLIC PANEL - Abnormal; Notable for the following:    Anion gap 4 (*)    All other components within normal limits  HIV ANTIBODY (ROUTINE  TESTING)  POC URINE PREG, ED  I-STAT TROPOININ, ED  GC/CHLAMYDIA PROBE AMP ([place]) NOT AT [facility]    EKG  EKG Interpretation  Date/Time:  Friday February 15 2016 01:12:44 EDT Ventricular Rate:  60 PR Interval:    QRS Duration: 91 QT Interval:  412 QTC Calculation: 412 R Axis:   66 Text Interpretation:  Sinus rhythm Normal ECG Confirmed by POLLINA  MD, CHRISTOPHER [phone]) on 02/15/2016 1:29:08 AM       Radiology Dg Chest 2 View  Result Date: 02/15/2016 CLINICAL DATA:  Cough and chest pain. EXAM: CHEST  2 VIEW COMPARISON:  None. FINDINGS: The cardiomediastinal contours are normal. Borderline bronchial thickening. Pulmonary vasculature is normal. No consolidation, pleural effusion, or pneumothorax. No acute osseous abnormalities are seen. IMPRESSION: Borderline bronchial thickening, can be seen with bronchitis or asthma. Electronically Signed   By: Jeb Levering M.D.   On: 02/15/2016 01:18   US Transvaginal Non-ob  Result Date: 02/15/2016 CLINICAL DATA:  Pain for 10 days. EXAM: TRANSABDOMINAL AND TRANSVAGINAL ULTRASOUND OF PELVIS TECHNIQUE: Both transabdominal and transvaginal ultrasound examinations of the pelvis were performed. Transabdominal technique was performed for global imaging of the pelvis including uterus, ovaries, adnexal regions, and pelvic cul-de-sac. It was necessary to proceed with endovaginal exam following the transabdominal exam to visualize the endometrium, left and right ovary. COMPARISON:  None FINDINGS: Uterus Measurements: 10.4 x 5.4 x 5.6 cm. No fibroids or other mass visualized. Endometrium Thickness: 13.2 mm.  No focal abnormality visualized. Right ovary Measurements: 4.0 x 2.2 x 3.2 cm. Normal appearance/no adnexal mass. Normal blood flow. Left ovary Measurements: 4.6 x 3.3 x 3.5 cm. Normal blood flow. There is a complex cyst in the left ovary measuring 2.6 x 2.6 x 2.6 cm with internal septation, possible clot retraction. No adnexal mass. Other findings No  abnormal free fluid.  Trace free fluid is physiologic. IMPRESSION: 1. Complex left ovarian cyst measuring 2.6 cm, likely a hemorrhagic cyst. Based on size, no dedicated imaging follow-up is needed. If symptoms persist, short-interval follow up ultrasound in 6-12 weeks can be considered, preferably during the week following the patient's normal menses. 2. Normal sonographic appearance of the right ovary and uterus. Electronically Signed   By: Jeb Levering M.D.   On: 02/15/2016 04:00   US Pelvis Complete  Result Date: 02/15/2016 CLINICAL DATA:  Pain for 10 days. EXAM: TRANSABDOMINAL AND TRANSVAGINAL ULTRASOUND OF PELVIS TECHNIQUE: Both transabdominal and transvaginal ultrasound examinations of the pelvis were performed. Transabdominal technique was performed for global imaging of the pelvis including uterus, ovaries, adnexal regions, and pelvic cul-de-sac. It was necessary to proceed with endovaginal exam following the transabdominal exam to visualize the endometrium, left and right ovary. COMPARISON:  None FINDINGS: Uterus Measurements: 10.4 x 5.4 x 5.6 cm. No fibroids or other mass visualized. Endometrium Thickness: 13.2 mm.  No focal abnormality visualized. Right ovary Measurements: 4.0 x 2.2 x 3.2 cm. Normal appearance/no adnexal mass. Normal blood flow. Left ovary Measurements: 4.6 x 3.3 x 3.5 cm. Normal blood flow. There is a complex cyst in the left ovary measuring 2.6 x 2.6 x 2.6 cm with internal septation, possible clot retraction. No adnexal mass. Other findings No abnormal free fluid.  Trace free fluid is physiologic. IMPRESSION: 1. Complex left ovarian cyst measuring 2.6 cm, likely a hemorrhagic cyst. Based on size, no dedicated imaging follow-up is needed. If symptoms persist, short-interval follow up ultrasound in 6-12 weeks can be considered, preferably during the week following the patient's normal menses. 2. Normal sonographic appearance of the right ovary and uterus. Electronically Signed    By: Jeb Levering M.D.   On: 02/15/2016 04:00    Procedures Procedures (including critical care time)  Medications Ordered in ED Medications  sodium chloride 0.9 % bolus 1,000 mL (1,000 mLs Intravenous New Bag/Given 02/15/16 0119)   Initial Impression / Assessment and Plan / ED Course  I have reviewed the triage vital signs and the nursing notes.  Pertinent labs & imaging results that were available during my care of the patient were reviewed by me and considered in my medical decision making (see chart for details).  Clinical Course    Final Clinical Impressions(s) / ED Diagnoses  I have reviewed and evaluated the relevant laboratory values I have reviewed and evaluated the relevant imaging studies.  I have interpreted the relevant EKG. I have reviewed the relevant previous healthcare records. I obtained HPI from historian. Patient discussed with supervising physician  ED Course:  Assessment: Pt is a 30yF who presents with chest pain, abdominal pain. On exam, pt in NAD. Nontoxic/nonseptic appearing. VSS. Afebrile. Lungs CTA. Heart RRR. Abdomen nontender soft. GU exam with discharge, but no CMT or Adnexal tenderness. Clear/white discharge noted. Labs reassuring. Wet prep unremarkable. US Pelvis with hemorrhagic Cyst likely causing discomfort. Will treat with Rocephin/Azithro due to symptoms as well. No PID indicated.Trop negative. CBC unremarkable. EKG NSR. CXR with evidence of bronchitis. Will treat with ABX due to duration of symptoms. Chest pain is not likely of cardiac or pulmonary etiology d/t presentation, perc negative, VSS, no tracheal deviation, no JVD or new murmur, RRR, breath sounds equal bilaterally. Given analgesia in ED. Plan is to DC home with follow up to PCP. At time of discharge, Patient is in no acute distress. Vital Signs are stable. Patient is able to ambulate. Patient able to tolerate PO.   Disposition/Plan:  DC Home Additional Verbal discharge instructions given  and discussed with patient.  Pt Instructed to f/u with PCP in the next week for evaluation and treatment of symptoms. Return precautions given Pt acknowledges and agrees with plan  Supervising Physician Orpah Greek, MD   Final diagnoses:  Pelvic pain in female  Hemorrhagic ovarian cyst  Bronchitis    New Prescriptions New Prescriptions   No medications on file    I personally performed the  services described in this documentation, which was scribed in my presence. The recorded information has been reviewed and is accurate.     Shary Decamp, PA-C 02/15/16 0500    Orpah Greek, MD 02/15/16 [phone]

## 2016-02-15 NOTE — ED Notes (Signed)
Patient transported to X-ray 

## 2016-02-15 NOTE — Discharge Instructions (Signed)
Please read and follow all provided instructions.  Your diagnoses today include:  1. Hemorrhagic ovarian cyst   2. Pelvic pain in female   3. Bronchitis    Tests performed today include: Vital signs. See below for your results today.   Medications prescribed:  Take as prescribed   Home care instructions:  Follow any educational materials contained in this packet.  Follow-up instructions: Please follow-up with your primary care provider for further evaluation of symptoms and treatment   Return instructions:  Please return to the Emergency Department if you do not get better, if you get worse, or new symptoms OR  - Fever (temperature greater than 101.30F)  - Bleeding that does not stop with holding pressure to the area    -Severe pain (please note that you may be more sore the day after your accident)  - Chest Pain  - Difficulty breathing  - Severe nausea or vomiting  - Inability to tolerate food and liquids  - Passing out  - Skin becoming red around your wounds  - Change in mental status (confusion or lethargy)  - New numbness or weakness    Please return if you have any other emergent concerns.  Additional Information:  Your vital signs today were: BP 121/82 (BP Location: Left Arm)    Pulse 76    Temp 98.6 F (37 C) (Oral)    Resp 15    Ht 5\' 3"  (1.6 m)    LMP 01/31/2016    SpO2 99%  If your blood pressure (BP) was elevated above 135/85 this visit, please have this repeated by your doctor within one month. ---------------

## 2016-04-09 ENCOUNTER — Ambulatory Visit: Payer: Self-pay | Admitting: Family Medicine

## 2016-04-14 ENCOUNTER — Encounter: Payer: Self-pay | Admitting: Family Medicine

## 2016-04-14 ENCOUNTER — Encounter: Payer: Self-pay | Admitting: Psychology

## 2016-04-14 ENCOUNTER — Ambulatory Visit (INDEPENDENT_AMBULATORY_CARE_PROVIDER_SITE_OTHER): Payer: BLUE CROSS/BLUE SHIELD | Admitting: Family Medicine

## 2016-04-14 VITALS — BP 116/69 | HR 63 | Temp 98.3°F | Wt 170.0 lb

## 2016-04-14 DIAGNOSIS — R1031 Right lower quadrant pain: Secondary | ICD-10-CM | POA: Diagnosis not present

## 2016-04-14 DIAGNOSIS — F331 Major depressive disorder, recurrent, moderate: Secondary | ICD-10-CM | POA: Diagnosis not present

## 2016-04-14 DIAGNOSIS — M545 Low back pain, unspecified: Secondary | ICD-10-CM

## 2016-04-14 DIAGNOSIS — R079 Chest pain, unspecified: Secondary | ICD-10-CM | POA: Diagnosis not present

## 2016-04-14 MED ORDER — TRAMADOL HCL 50 MG PO TABS: 50.0000 mg | ORAL_TABLET | Freq: Three times a day (TID) | ORAL | 0 refills | Status: DC | PRN

## 2016-04-14 MED ORDER — SERTRALINE HCL 50 MG PO TABS: 50.0000 mg | ORAL_TABLET | Freq: Every day | ORAL | 3 refills | Status: DC

## 2016-04-14 MED ORDER — MUPIROCIN 2 % EX OINT: 1.0000 "application " | TOPICAL_OINTMENT | Freq: Two times a day (BID) | CUTANEOUS | 1 refills | Status: DC

## 2016-04-14 MED ORDER — CYCLOBENZAPRINE HCL 10 MG PO TABS: 10.0000 mg | ORAL_TABLET | Freq: Three times a day (TID) | ORAL | 0 refills | Status: DC | PRN

## 2016-04-14 NOTE — Progress Notes (Unsigned)
Dr. Lorenso Courier requested a [place].   Presenting Issue:  Patient under great deal of stress and wants to proactive in anticipating further depressive symptoms  Report of symptoms:  Patient worries about her decision to quit job at [employer], previously had homicidal thoughts against employees at [employer] due to being mistreated there, but this is no longer a problem given she has quit that job. She has a hard time coping with stress and worry.  Duration of CURRENT symptoms:  Last few months  Age of onset of first mood disturbance:  Had difficulty with depression for several years  Impact on function:    Psychiatric History - Diagnoses: Major Depressive Disorder - Hospitalizations: A few years ago, hospitalized at [facility], described as eye-opening experience - Pharmacotherapy: Previously on Zoloft, now restarting - Outpatient therapy:   Family history of psychiatric issues:    Current and history of substance use:  None, but has once drank excessively and made self-harm threat that she didn't mean  Medical conditions that might explain or contribute to symptoms:    PHQ-9:   GAD-7:   MDQ (if indicated):    Assessment / Plan / Recommendations: Patient discussed stress of working at [employer] and being mistreated and overworked by Librarian, academic. 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 She wants to develop coping skills to help when she worries about her decision to quit her job and its financial impact and how to ride out the times when she feels down. We discussed the importance of spending time with her sister and her daughter when she feels down, even if she doesn't feel like doing so and of taking time to pursue activities that make her proud like cooking for others. Patient stated she is restarting Zoloft. [facility] will follow-up with patient in 2 weeks.

## 2016-04-14 NOTE — Progress Notes (Signed)
Subjective: CC: Mood HPI: Patient is a 30 y.o. female with a past medical history of MDD previously seen by [facility] presenting to clinic today for her mood and multiple other complaints.  Back pain: diffuse down the spine from neck to buttocks. Notes it feels sharp and tense.  If she gets someone to pop it, it feels better. Massages help transiently. Heat doesn't help. Tylenol, Ibuprofen, Goody's powder gives temporarily relief. She has a dull, deep pain the in lumbar region bilaterally that worsens with movement. No saddle paresthesias, unilateral weakness, or numbness or tingling. No radiation of pain.   Abdominal pain: never went away since she went to the ED in August. Notes it is pressure on the R lumbar region.  When she moves a certain way it is a sharp pain. She's had PID in the past but doesn't feel like this is as bad. No radiation. Nothing else makes it better. Nothing makes it worse. She notes it is stable since the ED with no worsening but no improvement.  No dysuria, urinary urgency, or frequency White vaginal discharge with slight odor but this resolved. She used OTC cream to help it go away.  US Pelvis with hemorrhagic cyst likely causing discomfort was what she was told.  No fevers, chills Eating/drinking normally with no change in BMs.   Chest pain: has been happening for 1 year, she's just been ignoring it. It feels "deep" and throbbing. Its left sided and under her breast. No radiation. Deep inspiration makes it worse. Nothing makes it better. No cough. She was told she had bronchitis in the ED. Notes it hasn't changed since she was in the ED back in August. At that time had EKG and CXR. States she has not associated symptoms such as SOB, diaphoresis, numbness, or tingling. No jaw pain. No cough, fevers, chills.   Depression: She needs to talk to a psychiatrist today about mood. She last went to [facility] in 2015 for MDD. She was on Zoloft. Not on any medications now b/c  she felt like she was doing better so she discontinued the Zoloft.  No bad thoughts recently. She has had SI in the past without a plan. Notes her sister and her children are what keeps her from doing anything- they keep a good eye on her and if they feel she's sliding, they watch over her. She feels she can call her sister at anytime.  Notes her symptoms are situational. Was working 2 jobs but recently quit one job which she feels improved her stress some, but soon she'll worry about money. No h/o bipolar per the patient. She denies any manic type episodes while on Zoloft. No AVH.   PHQ-9: 17, somewhat difficult GAD-7: 20, somewhat difficult   Social History: current smoker   Health Maintenance: declined flu vaccine today, will consider later   ROS: All other systems reviewed and are negative.  Past Medical History Patient Active Problem List   Diagnosis Date Noted  . Abdominal pain 04/16/2016  . Vaginal discharge 07/13/2015  . MRSA colonization 09/25/2014  . Musculoskeletal pain 09/25/2014  . Major depressive disorder, recurrent episode, moderate ([place]) 06/08/2013  . Genital herpes 02/16/2013  . Back pain 10/13/2011  . TOBACCO USER 03/31/2009  . OBESITY, NOS 08/20/2006    Medications- reviewed and updated  Objective: Office vital signs reviewed. BP 116/69   Pulse 63   Temp 98.3 F (36.8 C) (Oral)   Wt 170 lb (77.1 kg)   LMP 03/29/2016  SpO2 100%   BMI 30.11 kg/m    Physical Examination:  General: Awake, alert, well- nourished, NAD Cardio: RRR, no m/r/g noted. No tenderness to palpation over the anterior chest wall. Pt denies any chest pain currently. Pulm: No increased WOB.  CTAB, without wheezes, rhonchi or crackles noted.  GI: +BS, soft, non-distended. Mild tenderness in the suprapubic region and RLQ.   No rebound or guarding.  Back: normal to inspection. Normal range of motion. Tenderness of the paraspinal muscles bilaterally diffusely, most notable in the lumbar  region. Negative SLR. 5/5 strength in LEs. Symmetric DTRs. Sensation intact. Psych: stated mood "ok now." appropriate affect. No AVH. Does not seem internally distracted. Speech non-pressured, normal tone. No SI or HI.   Assessment/Plan: Major depressive disorder, recurrent episode, moderate Uncontrolled. Currently no SI. It seems as though she has a good support system. She saw integrated care in clinic today.  It sounds as thought she was doing fairly well on Zoloft previously. Noted she did have a rash and Zoloft was d/c in the past, however later not thought to be a medication side effect. - will start Zoloft at a low dose, discussed return precautions/side effects  - patient given Mobile Crisis number and other resources - i'm curious if this has some impact on her many complaints today. - f/u in 2 weeks   Back pain Possible acute on chronic. Seems to be mostly muscular in nature. No red flags on exam or history - prescribed a small amount of Flexeril - discussed sedation - strict return precautions discussed - f/u in 2 weeks.  Abdominal pain Suspect secondary to cyst noted on [place] in the ED, however unsure if it is persistent vs a new one. No evidence of PID on [place] at that time. No fevers, chills, or vaginal discharge. Pt's LMP was >2 weeks ago. No urinary or vaginal symptoms. Additional work in the ED was unremarkable.  - discussed Ibuprofen with tramadol for break through pain. - discussed strict return precautions- inability to take PO, fevers, chills, worsening pain.  - f/u in 2 weeks, at that time if she's still having pain will try to schedule TVUS/pelvic US the week following her menses.   Chest pain: unclear etiology. Would be odd to be from bronchitis back in August, however she's had this pain for >1 year. Not currently symptomatic. EKG unremarkable in ED. Not tachycardic or hypoxic to suggest acute PE (additionally, no risk factors).  Without change in symptoms, no need to  repeat EKG today. Discussed seeing if Ibuprofen helps with pain, this would suggest a more MSK etiology. Discussed return precautions. Pt to f/u in 2 weeks or sooner as needed.    No orders of the defined types were placed in this encounter.   Meds ordered this encounter  Medications  . DISCONTD: mupirocin ointment (BACTROBAN) 2 %    Sig: Place 1 application into the nose 2 (two) times daily. For 7 days.    Dispense:  30 g    Refill:  1  . sertraline (ZOLOFT) 50 MG tablet    Sig: Take 1 tablet (50 mg total) by mouth daily.    Dispense:  30 tablet    Refill:  3  . traMADol (ULTRAM) 50 MG tablet    Sig: Take 1 tablet (50 mg total) by mouth every 8 (eight) hours as needed.    Dispense:  15 tablet    Refill:  0  . cyclobenzaprine (FLEXERIL) 10 MG tablet  Sig: Take 1 tablet (10 mg total) by mouth 3 (three) times daily as needed for muscle spasms.    Dispense:  20 tablet    Refill:  0  . mupirocin ointment (BACTROBAN) 2 %    Sig: Place 1 application into the nose 2 (two) times daily. For 7 days.    Dispense:  30 g    Refill:  [facility] PGY-3, [place]

## 2016-04-14 NOTE — Patient Instructions (Signed)
It was nice to see you. I have started you back on Zoloft. If you have thoughts of harming yourself or others, you have options: contact family/friends, our clinic, go to the emergency room, call our clinic, or call the [facility] (Toll Free): [phone] that is available day or night.   For your abdominal pain, I suspect it is due to the continued cyst. If you develop fevers, chills, or worsening pain please seek care. I have prescribed tramadol to help with the pain, it can make you sleepy. I have prescribed flexeril to help with the neck/shoulder pain.

## 2016-04-16 DIAGNOSIS — R109 Unspecified abdominal pain: Secondary | ICD-10-CM | POA: Insufficient documentation

## 2016-04-16 MED ORDER — MUPIROCIN 2 % EX OINT: 1.0000 "application " | TOPICAL_OINTMENT | Freq: Two times a day (BID) | CUTANEOUS | 1 refills | Status: DC

## 2016-04-16 NOTE — Assessment & Plan Note (Signed)
Uncontrolled. Currently no SI. It seems as though she has a good support system. She saw integrated care in clinic today.  It sounds as thought she was doing fairly well on Zoloft previously. Noted she did have a rash and Zoloft was d/c in the past, however later not thought to be a medication side effect. - will start Zoloft at a low dose, discussed return precautions/side effects  - patient given Mobile Crisis number and other resources - i'm curious if this has some impact on her many complaints today. - f/u in 2 weeks

## 2016-04-16 NOTE — Assessment & Plan Note (Signed)
Suspect secondary to cyst noted on [place] in the ED, however unsure if it is persistent vs a new one. No evidence of PID on [place] at that time. No fevers, chills, or vaginal discharge. Pt's LMP was >2 weeks ago. No urinary or vaginal symptoms. Additional work in the ED was unremarkable.  - discussed Ibuprofen with tramadol for break through pain. - discussed strict return precautions- inability to take PO, fevers, chills, worsening pain.  - f/u in 2 weeks, at that time if she's still having pain will try to schedule TVUS/pelvic US the week following her menses.

## 2016-04-16 NOTE — Assessment & Plan Note (Signed)
Possible acute on chronic. Seems to be mostly muscular in nature. No red flags on exam or history - prescribed a small amount of Flexeril - discussed sedation - strict return precautions discussed - f/u in 2 weeks.

## 2016-04-28 ENCOUNTER — Ambulatory Visit (INDEPENDENT_AMBULATORY_CARE_PROVIDER_SITE_OTHER): Payer: BLUE CROSS/BLUE SHIELD | Admitting: Psychology

## 2016-04-28 DIAGNOSIS — F331 Major depressive disorder, recurrent, moderate: Secondary | ICD-10-CM

## 2016-04-28 NOTE — Progress Notes (Signed)
Reason for follow-up: To assess change after beginning zoloft and practicing coping strategies.  Issues discussed:  Patient reports feeling much better -- her depression and anxiety had reduced which she attributes to a combination of starting zoloft and spending positive time with children when feeling negative. 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 Patient having minor tingling and numbness in her fingers occasionally since starting zoloft. She is unconcerned and reports that when she previously took zoloft, she experienced these side effects and they went away on their own.   PHQ-9: 6, GAD-7: 9  Identified goals:  Patient will continue to spend quality time with children when feeling down or anxious and will schedule with behavioral health if needed.

## 2016-04-28 NOTE — Assessment & Plan Note (Signed)
Assessment/Plan/Recommendations: Patient feeling much better since quitting job at [employer] and starting Zoloft. She is doing a good job not ruminating on worries and instead, problem solving and spending positive time with children. She has mild tingling and numbness in her fingers occasionally after starting zoloft but no other side effects. She had same side effects when taking zoloft in the past but they went away on their own. Patient will continue to spend quality time with children and family when feeling stressed and will schedule with Behavioral health in the future if needed.

## 2016-05-18 ENCOUNTER — Other Ambulatory Visit: Payer: Self-pay | Admitting: Family Medicine

## 2016-05-18 DIAGNOSIS — A6000 Herpesviral infection of urogenital system, unspecified: Secondary | ICD-10-CM

## 2016-08-02 ENCOUNTER — Emergency Department (HOSPITAL_COMMUNITY)
Admission: EM | Admit: 2016-08-02 | Discharge: 2016-08-02 | Disposition: A | Discharge status: Home / Self Care | Payer: BLUE CROSS/BLUE SHIELD | Attending: Emergency Medicine | Admitting: Emergency Medicine

## 2016-08-02 ENCOUNTER — Encounter (HOSPITAL_COMMUNITY): Payer: Self-pay | Admitting: Oncology

## 2016-08-02 DIAGNOSIS — Z5321 Procedure and treatment not carried out due to patient leaving prior to being seen by health care provider: Secondary | ICD-10-CM | POA: Diagnosis not present

## 2016-08-02 DIAGNOSIS — R05 Cough: Secondary | ICD-10-CM | POA: Diagnosis present

## 2016-08-02 NOTE — ED Triage Notes (Signed)
Pt reports a cough x 1 week.  Pt c/o right sided CP worse w/ cough and shob.  Lung sounds clear b/l.  Pt able to speak in full sentences.  Rates pain 7/10.

## 2016-08-02 NOTE — ED Notes (Signed)
Pt called for room.  Pt is not in lobby, outside or in bathroom.  Will d/c LWBS.

## 2016-08-13 HISTORY — PX: OTHER SURGICAL HISTORY: SHX169

## 2016-09-18 ENCOUNTER — Emergency Department (HOSPITAL_COMMUNITY): Payer: Medicaid Other

## 2016-09-18 ENCOUNTER — Encounter (HOSPITAL_COMMUNITY): Payer: Self-pay | Admitting: Emergency Medicine

## 2016-09-18 DIAGNOSIS — R072 Precordial pain: Secondary | ICD-10-CM | POA: Diagnosis not present

## 2016-09-18 DIAGNOSIS — Z79899 Other long term (current) drug therapy: Secondary | ICD-10-CM | POA: Insufficient documentation

## 2016-09-18 DIAGNOSIS — R1012 Left upper quadrant pain: Secondary | ICD-10-CM | POA: Insufficient documentation

## 2016-09-18 DIAGNOSIS — F17210 Nicotine dependence, cigarettes, uncomplicated: Secondary | ICD-10-CM | POA: Diagnosis not present

## 2016-09-18 DIAGNOSIS — R079 Chest pain, unspecified: Secondary | ICD-10-CM | POA: Diagnosis present

## 2016-09-18 LAB — CBC
HCT: 37.6 % (ref 36.0–46.0)
HEMOGLOBIN: 12.1 g/dL (ref 12.0–15.0)
MCH: 26.9 pg (ref 26.0–34.0)
MCHC: 32.2 g/dL (ref 30.0–36.0)
MCV: 83.7 fL (ref 78.0–100.0)
Platelets: 299 10*3/uL (ref 150–400)
RBC: 4.49 MIL/uL (ref 3.87–5.11)
RDW: 15.3 % (ref 11.5–15.5)
WBC: 12.9 10*3/uL — ABNORMAL HIGH (ref 4.0–10.5)

## 2016-09-18 LAB — BASIC METABOLIC PANEL
Anion gap: 9 (ref 5–15)
BUN: 13 mg/dL (ref 6–20)
CALCIUM: 9.8 mg/dL (ref 8.9–10.3)
CO2: 23 mmol/L (ref 22–32)
CREATININE: 0.85 mg/dL (ref 0.44–1.00)
Chloride: 107 mmol/L (ref 101–111)
GFR calc Af Amer: >60 mL/min (ref >60)
GLUCOSE: 96 mg/dL (ref 65–99)
Potassium: 3.7 mmol/L (ref 3.5–5.1)
Sodium: 139 mmol/L (ref 135–145)

## 2016-09-18 LAB — I-STAT TROPONIN, ED: TROPONIN I, POC: 0 ng/mL (ref 0.00–0.08)

## 2016-09-18 NOTE — ED Triage Notes (Signed)
Pt reports new onset chest pain while working today, states she thought it was gas and tried tums with no relief. States ibuprofen not effective either. No change with palpation/movement.

## 2016-09-19 ENCOUNTER — Encounter (HOSPITAL_COMMUNITY): Payer: Self-pay | Admitting: Radiology

## 2016-09-19 ENCOUNTER — Emergency Department (HOSPITAL_COMMUNITY)
Admission: EM | Admit: 2016-09-19 | Discharge: 2016-09-19 | Disposition: A | Discharge status: Home / Self Care | Payer: Medicaid Other | Attending: Emergency Medicine | Admitting: Emergency Medicine

## 2016-09-19 ENCOUNTER — Emergency Department (HOSPITAL_COMMUNITY): Payer: Medicaid Other

## 2016-09-19 DIAGNOSIS — R072 Precordial pain: Secondary | ICD-10-CM

## 2016-09-19 DIAGNOSIS — R1012 Left upper quadrant pain: Secondary | ICD-10-CM

## 2016-09-19 LAB — HEPATIC FUNCTION PANEL
ALK PHOS: 49 U/L (ref 38–126)
ALT: 14 U/L (ref 14–54)
AST: 19 U/L (ref 15–41)
Albumin: 4.1 g/dL (ref 3.5–5.0)
BILIRUBIN TOTAL: 0.4 mg/dL (ref 0.3–1.2)
Bilirubin, Direct: <0.1 mg/dL — ABNORMAL LOW (ref 0.1–0.5)
Total Protein: 7.5 g/dL (ref 6.5–8.1)

## 2016-09-19 LAB — URINALYSIS, ROUTINE W REFLEX MICROSCOPIC
BILIRUBIN URINE: NEGATIVE
Glucose, UA: NEGATIVE mg/dL
HGB URINE DIPSTICK: NEGATIVE
Ketones, ur: 5 mg/dL — AB
Leukocytes, UA: NEGATIVE
Nitrite: NEGATIVE
PH: 5 (ref 5.0–8.0)
Protein, ur: NEGATIVE mg/dL
Specific Gravity, Urine: 1.019 (ref 1.005–1.030)

## 2016-09-19 LAB — LIPASE, BLOOD: Lipase: 50 U/L (ref 11–51)

## 2016-09-19 MED ORDER — HYDROCODONE-ACETAMINOPHEN 5-325 MG PO TABS
1.0000 | ORAL_TABLET | Freq: Once | ORAL | Status: AC
  Administered 2016-09-19: 1 via ORAL
  Filled 2016-09-19: qty 1

## 2016-09-19 MED ORDER — FENTANYL CITRATE (PF) 100 MCG/2ML IJ SOLN
50.0000 ug | Freq: Once | INTRAMUSCULAR | Status: AC
  Administered 2016-09-19: 50 ug via INTRAVENOUS
  Filled 2016-09-19: qty 2

## 2016-09-19 MED ORDER — ONDANSETRON 4 MG PO TBDP
8.0000 mg | ORAL_TABLET | Freq: Once | ORAL | Status: AC
  Administered 2016-09-19: 8 mg via ORAL
  Filled 2016-09-19: qty 2

## 2016-09-19 MED ORDER — RANITIDINE HCL 150 MG PO TABS: 150.0000 mg | ORAL_TABLET | Freq: Two times a day (BID) | ORAL | 0 refills | Status: DC

## 2016-09-19 MED ORDER — IOPAMIDOL (ISOVUE-370) INJECTION 76%
INTRAVENOUS | Status: AC
  Administered 2016-09-19: 100 mL
  Filled 2016-09-19: qty 100

## 2016-09-19 MED ORDER — GI COCKTAIL ~~LOC~~
30.0000 mL | Freq: Once | ORAL | Status: AC
  Administered 2016-09-19: 30 mL via ORAL
  Filled 2016-09-19: qty 30

## 2016-09-19 NOTE — ED Notes (Signed)
Pt up in room crying in pain. Says it hurts to lay down. MD notified of increase in pain.

## 2016-09-19 NOTE — ED Notes (Signed)
Patient left at this time with all belongings. 

## 2016-09-19 NOTE — ED Notes (Signed)
Pt up to desk asking for update, provided and apologized for wait.

## 2016-09-19 NOTE — ED Provider Notes (Signed)
[facility] DEPT Provider Note   CSN: [id number] Arrival date & time: 09/18/16  2206     History   Chief Complaint Chief Complaint  Patient presents with  . Chest Pain    HPI Candice [name] is a 31 y.o. female with a hx of migraines, depression presents to the Emergency Department complaining of gradual, persistent, progressively worsening chest pain rated at a 10/10 and radiating into her right neck and upper abd onset 12:00pm. Associated symptoms include generalized headache similar to previous headaches.  Leaning away from the pain makes it better and sitting straight up makes it worse.  Pt denies fever, chills, nausea, vomiting, diarrhea, weakness, dizziness, syncope. Pt reports tubal ligation reversal in Feb 2018.  Other abd surgeries include c-section x 2.         Chest Pain   Associated symptoms include abdominal pain.    Past Medical History:  Diagnosis Date  . Complication of anesthesia    epidural with last pregnancy made entire left side numb, had to d/c  . Depression   . Headache   . HSV 06/28/2007  . Hx of migraines     Patient Active Problem List   Diagnosis Date Noted  . Abdominal pain 04/16/2016  . Vaginal discharge 07/13/2015  . MRSA colonization 09/25/2014  . Musculoskeletal pain 09/25/2014  . Major depressive disorder, recurrent episode, moderate ([place]) 06/08/2013  . Genital herpes 02/16/2013  . Back pain 10/13/2011  . TOBACCO USER 03/31/2009  . OBESITY, NOS 08/20/2006    Past Surgical History:  Procedure Laterality Date  . CESAREAN SECTION    . CESAREAN SECTION  09/07/2011   Procedure: CESAREAN SECTION;  Surgeon: Florian Buff, MD;  Location: [facility];  Service: Gynecology;  Laterality: N/A;  Repeat cesarean section with delivery of baby boy at 40. Apgars 9/9.  Bilateral tubal ligation.  . IUD REMOVAL  2005   in cervix  . TUBAL LIGATION      OB History    Gravida Para Term Preterm AB Living   2 2 2  0 0 2   SAB TAB Ectopic Multiple  Live Births   0 0 0 0 2       Home Medications    Prior to Admission medications   Medication Sig Start Date End Date Taking? Authorizing Provider  cyclobenzaprine (FLEXERIL) 10 MG tablet Take 1 tablet (10 mg total) by mouth 3 (three) times daily as needed for muscle spasms. 04/14/16  Yes Archie Patten, MD  ibuprofen (ADVIL,MOTRIN) 200 MG tablet Take 400 mg by mouth every 6 (six) hours as needed for headache, mild pain or moderate pain.   Yes Historical Provider, MD  sertraline (ZOLOFT) 50 MG tablet Take 1 tablet (50 mg total) by mouth daily. 04/14/16  Yes Archie Patten, MD  traMADol (ULTRAM) 50 MG tablet Take 1 tablet (50 mg total) by mouth every 8 (eight) hours as needed. Patient taking differently: Take 50 mg by mouth every 8 (eight) hours as needed for moderate pain.  04/14/16  Yes Archie Patten, MD  valACYclovir (VALTREX) 500 MG tablet TAKE 1 TABLET (500 MG) BY MOUTH TWICE A DAY FOR THREE DAYS AT THE FIRST SIGN OF AN OUTBREAK. Patient taking differently: TAKE 1 TABLET (500 MG) BY MOUTH TWICE A DAY FOR THREE DAYS AT THE FIRST SIGN OF AN OUTBREAK AS NEEDED FOR OUTBREAK 05/19/16  Yes Archie Patten, MD  ranitidine (ZANTAC) 150 MG tablet Take 1 tablet (150 mg total) by mouth 2 (  two) times daily. 09/19/16   Jarrett Soho Tyeler Goedken, PA-C    Family History Family History  Problem Relation Age of Onset  . Alcohol abuse Mother   . Drug abuse Mother   . Depression Mother   . Alcohol abuse Father   . Drug abuse Father   . Mental illness Sister     bipolar ptsd  . Schizophrenia Sister   . Diabetes Maternal Grandmother   . Hypertension Maternal Grandmother   . Cancer Maternal Grandmother     ovarian, breast and lung  . Depression Maternal Grandmother     Social History Social History  Substance Use Topics  . Smoking status: Current Every Day Smoker    Packs/day: 0.25    Types: Cigarettes  . Smokeless tobacco: Never Used  . Alcohol use 0.0 oz/week     Comment: occasional      Allergies   Patient has no known allergies.   Review of Systems Review of Systems  Cardiovascular: Positive for chest pain.  Gastrointestinal: Positive for abdominal pain.  Musculoskeletal: Positive for neck pain.     Physical Exam Updated Vital Signs BP 134/90   Pulse (!) 102   Temp 97.7 F (36.5 C) (Oral)   Resp (!) 34   Ht 5\' 3"  (1.6 m)   Wt 79.4 kg   LMP 09/11/2016 (Exact Date)   SpO2 100%   BMI 31.00 kg/m   Physical Exam  Constitutional: She appears well-developed and well-nourished. She appears distressed ( sobbing).  Awake, alert, nontoxic appearance  HENT:  Head: Normocephalic and atraumatic.  Mouth/Throat: Oropharynx is clear and moist. No oropharyngeal exudate.  Eyes: Conjunctivae are normal. No scleral icterus.  Neck: Normal range of motion. Neck supple.  Cardiovascular: Normal rate, regular rhythm and intact distal pulses.   Pulmonary/Chest: Effort normal and breath sounds normal. No respiratory distress. She has no wheezes.  Equal chest expansion  Abdominal: Soft. Bowel sounds are normal. She exhibits no mass. There is tenderness in the epigastric area and left upper quadrant. There is no rebound and no guarding.  Musculoskeletal: Normal range of motion. She exhibits no edema.  Neurological: She is alert.  Speech is clear and goal oriented Moves extremities without ataxia  Skin: Skin is warm and dry. She is not diaphoretic.  Psychiatric: She has a normal mood and affect.  Nursing note and vitals reviewed.    ED Treatments / Results  Labs (all labs ordered are listed, but only abnormal results are displayed) Labs Reviewed  CBC - Abnormal; Notable for the following:       Result Value   WBC 12.9 (*)    All other components within normal limits  HEPATIC FUNCTION PANEL - Abnormal; Notable for the following:    Bilirubin, Direct <0.1 (*)    All other components within normal limits  URINALYSIS, ROUTINE W REFLEX MICROSCOPIC - Abnormal; Notable  for the following:    Ketones, ur 5 (*)    All other components within normal limits  BASIC METABOLIC PANEL  LIPASE, BLOOD  I-STAT TROPOININ, ED  POC URINE PREG, ED    EKG  EKG Interpretation  Date/Time:  Thursday September 18 2016 22:17:38 EDT Ventricular Rate:  67 PR Interval:  174 QRS Duration: 78 QT Interval:  368 QTC Calculation: 388 R Axis:   66 Text Interpretation:  Normal sinus rhythm Normal ECG No significant change since last tracing Confirmed by Christy Gentles  MD, DONALD ([id number]) on 09/19/2016 2:26:29 AM       Radiology Dg  Chest 2 View  Result Date: 09/18/2016 CLINICAL DATA:  Initial evaluation for acute chest pain. EXAM: CHEST  2 VIEW COMPARISON:  Prior radiograph from 02/15/2016. FINDINGS: The cardiac and mediastinal silhouettes are stable in size and contour, and remain within normal limits. The lungs are normally inflated. No airspace consolidation, pleural effusion, or pulmonary edema is identified. There is no pneumothorax. No acute osseous abnormality identified. IMPRESSION: No active cardiopulmonary disease. Electronically Signed   By: Jeannine Boga M.D.   On: 09/18/2016 22:55   Ct Angio Chest/abd/pel For Dissection W And/or Wo Contrast  Result Date: 09/19/2016 CLINICAL DATA:  31 year old with midsternal chest pain radiating to the left and abdomen. EXAM: CT ANGIOGRAPHY CHEST, ABDOMEN AND PELVIS TECHNIQUE: Multidetector CT imaging through the chest, abdomen and pelvis was performed using the standard protocol during bolus administration of intravenous contrast. Multiplanar reconstructed images and MIPs were obtained and reviewed to evaluate the vascular anatomy. CONTRAST:  100 cc Isovue 370 IV COMPARISON:  Chest radiographs yesterday. FINDINGS: CTA CHEST FINDINGS Cardiovascular: Cardiac and respiratory motion artifact partially obscures evaluation. Curvilinear low densities within both the ascending aorta and pulmonary artery are felt to be secondary to motion artifact.  This does not have the morphologic appearance of aortic dissection. There no aortic hematoma or evidence of acute aortic syndrome. The heart is normal in size. There are no filling defects within the pulmonary arteries to the subsegmental level to suggest pulmonary embolus. No pericardial fluid. Mediastinum/Nodes: No mediastinal, hilar, or axillary adenopathy. Thyroid gland is normal. The esophagus is decompressed. Lungs/Pleura: Lungs are clear. No consolidation, pulmonary edema or pleural fluid. Musculoskeletal: There are no acute or suspicious osseous abnormalities. Review of the MIP images confirms the above findings. CTA ABDOMEN AND PELVIS FINDINGS VASCULAR Aorta: Normal caliber aorta without aneurysm, dissection, vasculitis or significant stenosis. Celiac: Patent without evidence of aneurysm, dissection, vasculitis or significant stenosis. SMA: Patent without evidence of aneurysm, dissection, vasculitis or significant stenosis. Renals: Both renal arteries are patent without evidence of aneurysm, dissection, vasculitis, fibromuscular dysplasia or significant stenosis. Accessory lower pole left renal artery arises from the distal abdominal aorta. IMA: Patent without evidence of aneurysm, dissection, vasculitis or significant stenosis. Inflow: Patent without evidence of aneurysm, dissection, vasculitis or significant stenosis. Veins: No obvious venous abnormality within the limitations of this arterial phase study. Review of the MIP images confirms the above findings. NON-VASCULAR Hepatobiliary: No focal hepatic abnormality on arterial phase imaging. Gallbladder physiologically distended, no calcified stone. No biliary dilatation. Pancreas: No ductal dilatation or inflammation. Spleen: Normal arterial phase enhancement. Adrenals/Urinary Tract: Normal adrenal glands. No hydronephrosis or perinephric edema. Symmetric renal enhancement. Urinary bladder is decompressed and not well evaluated. Stomach/Bowel: Stomach is  within normal limits. Appendix appears normal. No evidence of bowel wall thickening, distention, or inflammatory changes. Lymphatic: No adenopathy. Reproductive: Prominent uterine and adnexal vascularity bilaterally. Uterus is normal in size. No evidence of adnexal mass. Other: No free air, free fluid, or intra-abdominal fluid collection. Musculoskeletal: There are no acute or suspicious osseous abnormalities. Review of the MIP images confirms the above findings. IMPRESSION: No evidence of aortic dissection or acute abnormality in the chest, abdomen, or pelvis. Electronically Signed   By: Jeb Levering M.D.   On: 09/19/2016 04:55    Procedures Procedures (including critical care time)  Medications Ordered in ED Medications  gi cocktail (Maalox,Lidocaine,Donnatal) (not administered)  ondansetron (ZOFRAN-ODT) disintegrating tablet 8 mg (8 mg Oral Given 09/19/16 0250)  HYDROcodone-acetaminophen (NORCO/VICODIN) 5-325 MG per tablet 1 tablet (1 tablet Oral  Given 09/19/16 0250)  fentaNYL (SUBLIMAZE) injection 50 mcg (50 mcg Intravenous Given 09/19/16 0316)  iopamidol (ISOVUE-370) 76 % injection (100 mLs  Contrast Given 09/19/16 0412)     Initial Impression / Assessment and Plan / ED Course  I have reviewed the triage vital signs and the nursing notes.  Pertinent labs & imaging results that were available during my care of the patient were reviewed by me and considered in my medical decision making (see chart for details).     Patient is to be discharged with recommendation to follow up with PCP in regards to today's hospital visit. Chest pain is not likely of cardiac or pulmonary etiology d/t presentation, Ct angio negative, VSS, no tracheal deviation, no JVD or new murmur, RRR, breath sounds equal bilaterally, EKG without acute abnormalities, negative troponin, and negative CXR. Pt's pain has been controlled.  She ambulates without assistance in the ED.  Doubt PID or other abdominal etiology at this  time.  Pt has been advised to return to the ED if CP becomes exertional, associated with diaphoresis or nausea, radiates to left jaw/arm, worsens or becomes concerning in any way. Pt appears reliable for follow up and is agreeable to discharge.    Final Clinical Impressions(s) / ED Diagnoses   Final diagnoses:  Precordial pain  Left upper quadrant pain    New Prescriptions New Prescriptions   RANITIDINE (ZANTAC) 150 MG TABLET    Take 1 tablet (150 mg total) by mouth 2 (two) times daily.     Jarrett Soho Daney Moor, PA-C 09/19/16 2527    Ripley Fraise, MD 09/19/16 2325

## 2016-09-19 NOTE — Discharge Instructions (Signed)
1. Medications: zantac, usual home medications 2. Treatment: rest, drink plenty of fluids,  3. Follow Up: Please followup with your primary doctor in 2-3 days for discussion of your diagnoses and further evaluation after today's visit; if you do not have a primary care doctor use the resource guide provided to find one; Please return to the ER for worsening pain, difficulty breathing or other concerning symptoms

## 2016-10-08 ENCOUNTER — Other Ambulatory Visit: Payer: Self-pay | Admitting: Family Medicine

## 2016-10-09 NOTE — Telephone Encounter (Signed)
Refilled for 1 month supply.   Please have pt make an appt in our clinic.  Thanks,  Archie Patten, MD [facility] Resident  10/09/2016, 1:39 PM

## 2016-10-10 NOTE — Telephone Encounter (Signed)
Pt informed and scheduled her for and appt. Deseree Kennon Holter, CMA

## 2016-10-14 ENCOUNTER — Ambulatory Visit (HOSPITAL_COMMUNITY)
Admission: RE | Admit: 2016-10-14 | Discharge: 2016-10-14 | Disposition: A | Discharge status: Home / Self Care | Payer: Medicaid Other | Source: Ambulatory Visit | Attending: Family Medicine | Admitting: Family Medicine

## 2016-10-14 ENCOUNTER — Telehealth: Payer: Self-pay | Admitting: Family Medicine

## 2016-10-14 ENCOUNTER — Ambulatory Visit (INDEPENDENT_AMBULATORY_CARE_PROVIDER_SITE_OTHER): Payer: Medicaid Other | Admitting: Student

## 2016-10-14 ENCOUNTER — Encounter: Payer: Self-pay | Admitting: Student

## 2016-10-14 ENCOUNTER — Other Ambulatory Visit (HOSPITAL_COMMUNITY)
Admission: RE | Admit: 2016-10-14 | Discharge: 2016-10-14 | Disposition: A | Discharge status: Home / Self Care | Payer: Medicaid Other | Source: Ambulatory Visit | Attending: Family Medicine | Admitting: Family Medicine

## 2016-10-14 VITALS — BP 118/78 | HR 81 | Temp 98.2°F | Ht 63.0 in | Wt 184.4 lb

## 2016-10-14 DIAGNOSIS — N898 Other specified noninflammatory disorders of vagina: Secondary | ICD-10-CM

## 2016-10-14 DIAGNOSIS — R938 Abnormal findings on diagnostic imaging of other specified body structures: Secondary | ICD-10-CM | POA: Diagnosis not present

## 2016-10-14 DIAGNOSIS — N83202 Unspecified ovarian cyst, left side: Secondary | ICD-10-CM | POA: Insufficient documentation

## 2016-10-14 DIAGNOSIS — R1032 Left lower quadrant pain: Secondary | ICD-10-CM | POA: Insufficient documentation

## 2016-10-14 LAB — POCT URINALYSIS DIP (MANUAL ENTRY)
Bilirubin, UA: NEGATIVE
Glucose, UA: NEGATIVE mg/dL
Ketones, POC UA: NEGATIVE mg/dL
Leukocytes, UA: NEGATIVE
NITRITE UA: NEGATIVE
Protein Ur, POC: NEGATIVE mg/dL
RBC UA: NEGATIVE
SPEC GRAV UA: 1.02 (ref 1.010–1.025)
UROBILINOGEN UA: 0.2 U/dL
pH, UA: 8 (ref 5.0–8.0)

## 2016-10-14 LAB — POCT WET PREP (WET MOUNT)
CLUE CELLS WET PREP WHIFF POC: NEGATIVE
Trichomonas Wet Prep HPF POC: ABSENT

## 2016-10-14 MED ORDER — AZITHROMYCIN 250 MG PO TABS: 1000.0000 mg | ORAL_TABLET | Freq: Once | ORAL | Status: DC

## 2016-10-14 MED ORDER — METRONIDAZOLE 500 MG PO TABS: 500.0000 mg | ORAL_TABLET | Freq: Two times a day (BID) | ORAL | 0 refills | Status: DC

## 2016-10-14 MED ORDER — AZITHROMYCIN 500 MG PO TABS: 500.0000 mg | ORAL_TABLET | Freq: Once | ORAL | Status: DC

## 2016-10-14 MED ORDER — AZITHROMYCIN 500 MG PO TABS
1000.0000 mg | ORAL_TABLET | Freq: Once | ORAL | Status: AC
  Administered 2016-10-14: 1000 mg via ORAL

## 2016-10-14 MED ORDER — CEFTRIAXONE SODIUM 250 MG IJ SOLR
250.0000 mg | Freq: Once | INTRAMUSCULAR | Status: AC
  Administered 2016-10-14: 250 mg via INTRAMUSCULAR

## 2016-10-14 NOTE — Telephone Encounter (Signed)
Patient was called regarding [place] which showed a small likely hemorrhagic cysts on the left where she was having pain, The recommendation was to repeat US in 6-12 weeks. This was relayed to the patient and she will follow up to have this done

## 2016-10-14 NOTE — Assessment & Plan Note (Addendum)
Vaginal discharge with pelvic pain on exam concerning for pelvic infection. She is afebrile with normal vitals making outpateint therapy appropriate - will treat with azithro/rocephin in the clinic - Wet prep shows BV, will treat with flagyl - GC/CT, will follow - pelvic US given degree of pain to rule put intrapelvic process

## 2016-10-14 NOTE — Progress Notes (Signed)
   Subjective:    Patient ID: Candice [name], female    DOB: 1986-02-29, 31 y.o.   MRN: [id number]   CC: vaginal odor with discharge  HPI: 31 y/o F presents for vaginal odor with discharge   Vaginal odoro awith discharge - presents for the last 1-2 weeks -she denies abdominalp ain but does have left sided low back pain associated - no dysuria, fevers, or vaginal pain or irrittation - she is sexually active with one female partner - Patient's last menstrual period was 10/07/2016 (exact date).   Smoking status reviewed  Review of Systems  Per HPI, else denies chest pain, shortness of breath,     Objective:  BP 118/78   Pulse 81   Temp 98.2 F (36.8 C) (Oral)   Ht 5\' 3"  (1.6 m)   Wt 184 lb 6.4 oz (83.6 kg)   LMP 10/07/2016 (Exact Date)   SpO2 99%   BMI 32.66 kg/m  Vitals and nursing note reviewed  General: NAD Cardiac: RRR, Respiratory: CTAB, normal effort Skin: warm and dry, no rashes noted Neuro: alert and oriented, no focal deficits  Pelvic: Normal EGBUS, normal vaginal canal with moderate white discharge, normal cervix with some CMT, suprapubic tenderness and left adnexal tenderness, normal mobile uterus, normal adnexa with no masses    Assessment & Plan:    Vaginal discharge Vaginal discharge with pelvic pain on exam concerning for pelvic infection. She is afebrile with normal vitals making outpateint therapy appropriate - will treat with azithro/rocephin in the clinic - Wet prep shows BV, will treat with flagyl - GC/CT, will follow - pelvic US given degree of pain to rule put intrapelvic process    Greer Koeppen A. Lincoln Brigham MD, [facility] Resident PGY-3 Pager [phone]

## 2016-10-14 NOTE — Telephone Encounter (Signed)
Will forward to Dr. Lincoln Brigham who saw patient today. Candice [name],CMA

## 2016-10-14 NOTE — Telephone Encounter (Signed)
PCLM for pt to call the office. Please have her speak with Nehemiah Settle about Wet Prep results. Ottis Stain, CMA

## 2016-10-14 NOTE — Patient Instructions (Signed)
Follow up with PCP in 1 week You will be called about your test results If your pain gets much worse, go to [facility] Call the office with questions or concerns

## 2016-10-14 NOTE — Telephone Encounter (Signed)
Pt is retuning a call from [place]. Nothing is documented at this time and she would like a return call. jw

## 2016-10-15 LAB — CERVICOVAGINAL ANCILLARY ONLY
Chlamydia: NEGATIVE
NEISSERIA GONORRHEA: NEGATIVE

## 2016-10-17 ENCOUNTER — Telehealth: Payer: Self-pay | Admitting: Student

## 2016-10-17 NOTE — Telephone Encounter (Signed)
Reported to patient. 

## 2016-10-17 NOTE — Telephone Encounter (Signed)
Please infom the patient that her gorrhea and chlamydia testing are normal

## 2016-10-20 ENCOUNTER — Encounter: Payer: Self-pay | Admitting: Family Medicine

## 2016-10-20 ENCOUNTER — Ambulatory Visit (INDEPENDENT_AMBULATORY_CARE_PROVIDER_SITE_OTHER): Payer: Medicaid Other | Admitting: Family Medicine

## 2016-10-20 VITALS — BP 112/68 | HR 71 | Temp 98.1°F | Ht 63.0 in | Wt 183.2 lb

## 2016-10-20 DIAGNOSIS — F331 Major depressive disorder, recurrent, moderate: Secondary | ICD-10-CM | POA: Diagnosis not present

## 2016-10-20 DIAGNOSIS — N83202 Unspecified ovarian cyst, left side: Secondary | ICD-10-CM

## 2016-10-20 DIAGNOSIS — R0789 Other chest pain: Secondary | ICD-10-CM | POA: Diagnosis not present

## 2016-10-20 MED ORDER — SERTRALINE HCL 50 MG PO TABS: 50.0000 mg | ORAL_TABLET | Freq: Every day | ORAL | 1 refills | Status: DC

## 2016-10-20 MED ORDER — MELOXICAM 15 MG PO TABS: 15.0000 mg | ORAL_TABLET | Freq: Every day | ORAL | 0 refills | Status: DC

## 2016-10-20 NOTE — Patient Instructions (Signed)
I have placed an order for a transvaginal ultrasound for the end of June to follow up on your ovarian cyst.  I have prescribed Mobic, take it once a day for 2 weeks scheduled, then daily as needed for pain after that. Do not take Advil, ibuprofen, Aleve, Naproxen while on this medication. You can still take Tylenol.  Follow up with me in 2-4 weeks for your other concerns

## 2016-10-20 NOTE — Assessment & Plan Note (Signed)
Stable on Zoloft. Has seen behavioral health in the past and advised to f/u with them Prn. - refilled zoloft. - pt to f/u in 1 month sooner as needed (as I suspect her multiple complaints may be in part due to this).

## 2016-10-20 NOTE — Progress Notes (Signed)
Subjective: CC: "check up" HPI: Patient is a 31 y.o. female with a past medical history of MDD, obesity, several ED visits for chest pain, and hemorrhagic ovarian cysts presenting to clinic today for a follow up.  She was last seen by myself in 03/2016 and advised to f/u in 2 weeks. At that time she had numerous concerns. Today her concerns are as follows: continued chest pain, concerns for bladder cancer as her grandmother had it, depression, concerns for "something going on" due to her recurrent abdominal pain, large breasts. We discussed that we cannot cover all these at one visit and that this was an abbreviated visit as she was late.   Pt notes that chest pain tends to occur at the same time as the ovarian cysts. Denies abdominal pain currently. States chest pain is dull and sharp on the left side. She was prescribed Zantac at one of her ED visits but states it doesn't help. Sometimes ibuprofen helps the pain- she typically takes 800mg  BID due to back pain. If she lifts her left breast up, she notes improvement in pain. No SOB or diaphoresis.  Depression: better. She ran out of Zoloft and noted she was very irritable during that time. Re-started it and is doing much better. Still has decreased interest in things, low energy, and overeating.  No side effects from medication.  PHQ-9 8, somewhat difficult GAD07 6, somewhat difficult  Social History: no smoker   ROS: All other systems reviewed and are negative.  Past Medical History Patient Active Problem List   Diagnosis Date Noted  . Chest wall pain 10/20/2016  . Hemorrhagic cyst of left ovary 10/14/2016  . Abdominal pain 04/16/2016  . Vaginal discharge 07/13/2015  . MRSA colonization 09/25/2014  . Musculoskeletal pain 09/25/2014  . Major depressive disorder, recurrent episode, moderate ([place]) 06/08/2013  . Genital herpes 02/16/2013  . Back pain 10/13/2011  . TOBACCO USER 03/31/2009  . OBESITY, NOS 08/20/2006     Medications- reviewed and updated  Objective: Office vital signs reviewed. BP 112/68   Pulse 71   Temp 98.1 F (36.7 C) (Oral)   Ht 5\' 3"  (1.6 m)   Wt 183 lb 3.2 oz (83.1 kg)   LMP 10/07/2016 (Exact Date)   SpO2 99%   BMI 32.45 kg/m    Physical Examination:  General: Awake, alert, well- nourished, NAD Cardio: RRR, no m/r/g noted. Pain worsened/reproducible on anterior chest wall Pulm: No increased WOB.  CTAB, without wheezes, rhonchi or crackles noted.  Psych: stated mood "okay" appropriate affect. Appears anxious. No SI or HI. No AVH. Does not appear internally distracted.   Assessment/Plan: Hemorrhagic cyst of left ovary Will schedule repeat for end of June, after her menses.   Chest wall pain Pain is reproducible on exam. She's had several work ups in the ED.  The fact that pain improves with NSAIDs just further suggests that it is due to MSK etiology. - Rx for mobic, discussed avoiding other NSAIDS - strict return precautions discussed.  Major depressive disorder, recurrent episode, moderate Stable on Zoloft. Has seen behavioral health in the past and advised to f/u with them Prn. - refilled zoloft. - pt to f/u in 1 month sooner as needed (as I suspect her multiple complaints may be in part due to this).   Orders Placed This Encounter  Procedures  . US Transvaginal Non-OB    Standing Status:   Future    Standing Expiration Date:   12/20/2017    Order  Specific Question:   Reason for Exam (SYMPTOM  OR DIAGNOSIS REQUIRED)    Answer:   F/U Probable small hemorrhagic cyst left ovary    Order Specific Question:   Preferred imaging location?    Answer:   [facility]  . US Pelvis Complete    Standing Status:   Future    Standing Expiration Date:   12/20/2017    Order Specific Question:   Reason for Exam (SYMPTOM  OR DIAGNOSIS REQUIRED)    Answer:   F/U probable small hemorrhagic cyst left ovary    Order Specific Question:   Preferred imaging location?     Answer:   [facility]    Meds ordered this encounter  Medications  . meloxicam (MOBIC) 15 MG tablet    Sig: Take 1 tablet (15 mg total) by mouth daily.    Dispense:  30 tablet    Refill:  0  . sertraline (ZOLOFT) 50 MG tablet    Sig: Take 1 tablet (50 mg total) by mouth daily.    Dispense:  30 tablet    Refill:  [place] PGY-3, [place]

## 2016-10-20 NOTE — Assessment & Plan Note (Signed)
Will schedule repeat for end of June, after her menses.

## 2016-10-20 NOTE — Assessment & Plan Note (Signed)
Pain is reproducible on exam. She's had several work ups in the ED.  The fact that pain improves with NSAIDs just further suggests that it is due to MSK etiology. - Rx for mobic, discussed avoiding other NSAIDS - strict return precautions discussed.

## 2016-10-27 ENCOUNTER — Telehealth: Payer: Self-pay | Admitting: Family Medicine

## 2016-10-27 NOTE — Telephone Encounter (Signed)
Please let the pt know that insurance has denied our request to obtain a follow up ultrasound due to the small size of the cyst that was noted. Please cancel the [place] planned for 6/26 (as insurance has denied the claim).  If she continues to have issues, please have her make a follow up in our office.  Thanks, Archie Patten, MD [facility] Resident  10/27/2016, 1:45 PM

## 2016-10-29 NOTE — Telephone Encounter (Signed)
Patient informed, expressed understanding. Ultrasound cancelled.

## 2016-12-16 ENCOUNTER — Ambulatory Visit (HOSPITAL_COMMUNITY): Payer: Medicaid Other

## 2017-03-17 ENCOUNTER — Ambulatory Visit (INDEPENDENT_AMBULATORY_CARE_PROVIDER_SITE_OTHER): Payer: Self-pay | Admitting: Family Medicine

## 2017-03-17 ENCOUNTER — Encounter: Payer: Self-pay | Admitting: Family Medicine

## 2017-03-17 VITALS — BP 102/70 | HR 80 | Temp 99.1°F | Ht 63.0 in | Wt 181.0 lb

## 2017-03-17 DIAGNOSIS — M549 Dorsalgia, unspecified: Secondary | ICD-10-CM

## 2017-03-17 DIAGNOSIS — N926 Irregular menstruation, unspecified: Secondary | ICD-10-CM

## 2017-03-17 DIAGNOSIS — M2550 Pain in unspecified joint: Secondary | ICD-10-CM

## 2017-03-17 LAB — POCT URINALYSIS DIP (MANUAL ENTRY)
BILIRUBIN UA: NEGATIVE
BILIRUBIN UA: NEGATIVE mg/dL
Glucose, UA: NEGATIVE mg/dL
Leukocytes, UA: NEGATIVE
NITRITE UA: NEGATIVE
PH UA: 7 (ref 5.0–8.0)
Protein Ur, POC: NEGATIVE mg/dL
RBC UA: NEGATIVE
SPEC GRAV UA: 1.025 (ref 1.010–1.025)
Urobilinogen, UA: 0.2 E.U./dL

## 2017-03-17 LAB — POCT URINE PREGNANCY: PREG TEST UR: NEGATIVE

## 2017-03-17 MED ORDER — CYCLOBENZAPRINE HCL 10 MG PO TABS: 10.0000 mg | ORAL_TABLET | Freq: Three times a day (TID) | ORAL | 0 refills | Status: DC | PRN

## 2017-03-17 MED ORDER — NAPROXEN 500 MG PO TABS: 500.0000 mg | ORAL_TABLET | Freq: Two times a day (BID) | ORAL | 0 refills | Status: DC

## 2017-03-17 NOTE — Patient Instructions (Signed)

## 2017-03-17 NOTE — Progress Notes (Signed)
Subjective:     Patient ID: Candice [name], female   DOB: May 13, 1986, 31 y.o.   MRN: [id number]  Back Pain  This is a chronic (She has chronic low back pain at baseline for many year. She had a flare up in the last 3 weeks.) problem. The current episode started 1 to 4 weeks ago (3 weeks ago). The problem occurs constantly. The problem has been gradually worsening since onset. The pain is present in the lumbar spine and thoracic spine (B/L flank area, mid back and lower back. At times she has pain on her neck. The pain location varies, shifting from one side to the other but now it is mostly on her right side). The quality of the pain is described as aching. Radiates to: Mostly on her back. The pain is at a severity of 8/10. The pain is moderate. The pain is the same all the time. The symptoms are aggravated by bending, position, standing and twisting. Associated symptoms include numbness and tingling. Pertinent negatives include no abdominal pain, bladder incontinence, bowel incontinence, chest pain, dysuria, fever, headaches or weakness. (Intermittent tingling sensation of her extremities and numbness of her left LL. This has been on going for 3 weeks.  She currently does not have tingling or numbness.) She has tried NSAIDs, muscle relaxant and home exercises (Ibuprofen, Meloxicam, muscle relaxant.) for the symptoms. The treatment provided mild relief.  She has a strong family hx of arthritis and she is worried she might have it. She also endorsed finger pain.  Current Outpatient Prescriptions on File Prior to Visit  Medication Sig Dispense Refill  . valACYclovir (VALTREX) 500 MG tablet TAKE 1 TABLET (500 MG) BY MOUTH TWICE A DAY FOR THREE DAYS AT THE FIRST SIGN OF AN OUTBREAK. 6 tablet 5  . cyclobenzaprine (FLEXERIL) 10 MG tablet Take 1 tablet (10 mg total) by mouth 3 (three) times daily as needed for muscle spasms. (Patient not taking: Reported on 03/17/2017) 20 tablet 0  . meloxicam (MOBIC) 15 MG  tablet Take 1 tablet (15 mg total) by mouth daily. (Patient not taking: Reported on 03/17/2017) 30 tablet 0  . ranitidine (ZANTAC) 150 MG tablet Take 1 tablet (150 mg total) by mouth 2 (two) times daily. (Patient not taking: Reported on 03/17/2017) 60 tablet 0  . sertraline (ZOLOFT) 50 MG tablet Take 1 tablet (50 mg total) by mouth daily. (Patient not taking: Reported on 03/17/2017) 30 tablet 1  . traMADol (ULTRAM) 50 MG tablet Take 1 tablet (50 mg total) by mouth every 8 (eight) hours as needed. (Patient not taking: Reported on 03/17/2017) 15 tablet 0   No current facility-administered medications on file prior to visit.    Past Medical History:  Diagnosis Date  . Complication of anesthesia    epidural with last pregnancy made entire left side numb, had to d/c  . Depression   . Headache   . HSV 06/28/2007  . Hx of migraines       Review of Systems  Constitutional: Negative for fever.  Respiratory: Negative.   Cardiovascular: Negative for chest pain.  Gastrointestinal: Negative for abdominal pain and bowel incontinence.  Genitourinary: Negative for bladder incontinence and dysuria.  Musculoskeletal: Positive for back pain. Negative for gait problem, joint swelling and neck stiffness.  Neurological: Positive for tingling and numbness. Negative for weakness and headaches.  All other systems reviewed and are negative.      Objective:   Physical Exam  Constitutional: She is oriented to person, place, and  time. She appears well-developed. No distress.  Cardiovascular: Normal rate, regular rhythm and normal heart sounds.   No murmur heard. Pulmonary/Chest: Effort normal and breath sounds normal. No respiratory distress. She has no wheezes.  Abdominal: Soft. Bowel sounds are normal. She exhibits no distension and no mass. There is no tenderness.  Musculoskeletal: Normal range of motion. She exhibits no edema.       Right shoulder: She exhibits tenderness. She exhibits normal range of motion  and no swelling.       Left shoulder: She exhibits tenderness. She exhibits normal range of motion and no swelling.       Right wrist: Normal.       Left wrist: Normal.       Right hip: She exhibits normal range of motion.       Left hip: She exhibits normal range of motion.       Cervical back: Normal.       Lumbar back: She exhibits tenderness. She exhibits normal range of motion.       Arms: Neurological: She is alert and oriented to person, place, and time. She has normal reflexes. No cranial nerve deficit. Coordination normal.  Psychiatric:  Was teary during exam. Mood down  Nursing note and vitals reviewed.      Assessment:     Back pain Multiple joint pain Flank pain    Plan:     Acute on chronic pain syndrome. I could not pin point exactly her pain location as it varies. There is an element of MSK pain vs rheumatologic condition. RF, ANA,ESR and CRP checked. I will contact her with result. I started her on Naproxen prn pain in addition to refilling her Flexeril prn. Home back exercise discussed. Mood down due to pain but not suicidal. If all test comes back fine, consider PT referral for water aerobics as well as SSRI for pain control. She will need imaging study at some point. Concern about kidney stone given flank pain as well. UA looks fine hence less likely. Patient agreed with plan. F/U as needed.  More than 50% of this 25 min face to face encounter was spent of counseling, coordination of care and evaluation.

## 2017-03-18 ENCOUNTER — Encounter: Payer: Self-pay | Admitting: Family Medicine

## 2017-03-18 ENCOUNTER — Telehealth: Payer: Self-pay

## 2017-03-18 LAB — ANA: Anti Nuclear Antibody(ANA): NEGATIVE

## 2017-03-18 LAB — C-REACTIVE PROTEIN: CRP: 0.5 mg/L (ref 0.0–4.9)

## 2017-03-18 LAB — SEDIMENTATION RATE: Sed Rate: 15 mm/hr (ref 0–32)

## 2017-03-18 LAB — RHEUMATOID FACTOR: Rhuematoid fact SerPl-aCnc: <10 IU/mL (ref 0.0–13.9)

## 2017-03-18 NOTE — Telephone Encounter (Signed)
Pt contacted and informed of negative RA test. Pt informed lupus test is still pending and she will be getting another phone call from [place] soon.

## 2017-03-18 NOTE — Telephone Encounter (Signed)
-----   Message from Kinnie Feil, MD sent at 03/18/2017 10:03 AM EDT ----- Please advise patient that her rheumatoid arthritis lab is normal. Lupus test is pending. Will call with final result.

## 2017-03-23 ENCOUNTER — Ambulatory Visit (INDEPENDENT_AMBULATORY_CARE_PROVIDER_SITE_OTHER): Payer: Self-pay | Admitting: Internal Medicine

## 2017-03-23 ENCOUNTER — Encounter: Payer: Self-pay | Admitting: Internal Medicine

## 2017-03-23 VITALS — BP 118/72 | HR 70 | Temp 98.3°F | Ht 63.0 in | Wt 188.0 lb

## 2017-03-23 DIAGNOSIS — M545 Low back pain, unspecified: Secondary | ICD-10-CM

## 2017-03-23 MED ORDER — CYCLOBENZAPRINE HCL 10 MG PO TABS: 10.0000 mg | ORAL_TABLET | Freq: Two times a day (BID) | ORAL | 0 refills | Status: DC | PRN

## 2017-03-23 NOTE — Patient Instructions (Signed)
Please follow up with me as needed. I will place xrays to get your back evaluated. Continue the flexeril and  Naproxen as needed. As discussed, please try to work out and build your back muscles this is very important long term

## 2017-03-23 NOTE — Progress Notes (Signed)
   [name] [name], MD Phone: [phone]  Reason For Visit: Follow up for Back pain   #Chronic Back Pain  - Patient states she has had back pain all her life - many years. States from her neck all the way down to mid-back- she denies any radiation of the pain down her leg she denies any numbness. She denies any red flags. No urinary incontinence, bowel incontinence. No IV drug use. No steroid use. No fevers. no night sweats. no weight loss - Recently  seen by Dr. [name]  with lower back pain - Been taking muscle relaxers and naproxen which has helped significantly - Patient does stretches and does back pain -Previously patient has had physical therapy, she's been seen by a plastic surgeon about breast reduction but at the time was not able to pay based on her insurance  Past Medical History Reviewed problem list.  Medications- reviewed and updated No additions to family history Social history- patient is a smoker  Objective: BP 118/72   Pulse 70   Temp 98.3 F (36.8 C) (Oral)   Ht 5\' 3"  (1.6 m)   Wt 188 lb (85.3 kg)   LMP 02/23/2017 (Exact Date)   SpO2 99%   BMI 33.30 kg/m  Gen: NAD, alert, cooperative with exam MSK: Normal gait and station, tenderness in paraspinal muscles and trapezius, no spinal tenderness, no abnormalities on inspection, normal range of motion with flexion and extension the patient does note some pain with extension, 5 out of 5 strength in bilateral lower extremities, normal sensation Neuro: Strength and sensation grossly intact   Assessment/Plan: See problem based a/p  Back pain History of chronic mid-back pain. Significant strain from large breasts. Patient with tense trapezius bilaterally. Discussed exercises with patient. Will continue muscle relaxers. Patient plans to follow-up again with the hopes of being able to get a breast reduction in January.

## 2017-03-25 NOTE — Assessment & Plan Note (Signed)
History of chronic mid-back pain. Significant strain from large breasts. Patient with tense trapezius bilaterally. Discussed exercises with patient. Will continue muscle relaxers. Patient plans to follow-up again with the hopes of being able to get a breast reduction in January.

## 2017-04-30 ENCOUNTER — Other Ambulatory Visit: Payer: Self-pay | Admitting: Internal Medicine

## 2017-04-30 ENCOUNTER — Other Ambulatory Visit: Payer: Self-pay | Admitting: Family Medicine

## 2017-06-08 ENCOUNTER — Other Ambulatory Visit: Payer: Self-pay

## 2017-06-08 ENCOUNTER — Ambulatory Visit (INDEPENDENT_AMBULATORY_CARE_PROVIDER_SITE_OTHER): Payer: Self-pay | Admitting: Internal Medicine

## 2017-06-08 ENCOUNTER — Other Ambulatory Visit (HOSPITAL_COMMUNITY)
Admission: RE | Admit: 2017-06-08 | Discharge: 2017-06-08 | Disposition: A | Discharge status: Home / Self Care | Payer: Self-pay | Source: Ambulatory Visit | Attending: Family Medicine | Admitting: Family Medicine

## 2017-06-08 ENCOUNTER — Ambulatory Visit (HOSPITAL_COMMUNITY)
Admission: RE | Admit: 2017-06-08 | Discharge: 2017-06-08 | Disposition: A | Discharge status: Home / Self Care | Payer: Self-pay | Source: Ambulatory Visit | Attending: Family Medicine | Admitting: Family Medicine

## 2017-06-08 ENCOUNTER — Encounter: Payer: Self-pay | Admitting: Internal Medicine

## 2017-06-08 VITALS — BP 104/66 | HR 101 | Temp 98.2°F | Wt 192.0 lb

## 2017-06-08 DIAGNOSIS — R933 Abnormal findings on diagnostic imaging of other parts of digestive tract: Secondary | ICD-10-CM | POA: Insufficient documentation

## 2017-06-08 DIAGNOSIS — R1031 Right lower quadrant pain: Secondary | ICD-10-CM | POA: Insufficient documentation

## 2017-06-08 DIAGNOSIS — R399 Unspecified symptoms and signs involving the genitourinary system: Secondary | ICD-10-CM

## 2017-06-08 DIAGNOSIS — N838 Other noninflammatory disorders of ovary, fallopian tube and broad ligament: Secondary | ICD-10-CM | POA: Insufficient documentation

## 2017-06-08 LAB — POCT URINALYSIS DIP (MANUAL ENTRY)
BILIRUBIN UA: NEGATIVE
BILIRUBIN UA: NEGATIVE mg/dL
Blood, UA: NEGATIVE
GLUCOSE UA: NEGATIVE mg/dL
LEUKOCYTES UA: NEGATIVE
NITRITE UA: NEGATIVE
Protein Ur, POC: NEGATIVE mg/dL
Spec Grav, UA: 1.025 (ref 1.010–1.025)
Urobilinogen, UA: 0.2 E.U./dL
pH, UA: 7 (ref 5.0–8.0)

## 2017-06-08 LAB — POCT URINE PREGNANCY: Preg Test, Ur: NEGATIVE

## 2017-06-08 MED ORDER — IOPAMIDOL (ISOVUE-300) INJECTION 61%
INTRAVENOUS | Status: AC
  Administered 2017-06-08: 30 mL via ORAL
  Filled 2017-06-08: qty 30

## 2017-06-08 MED ORDER — IOPAMIDOL (ISOVUE-300) INJECTION 61%
INTRAVENOUS | Status: AC
  Administered 2017-06-08: 100 mL via INTRAVENOUS
  Filled 2017-06-08: qty 100

## 2017-06-08 MED ORDER — TRAMADOL HCL 50 MG PO TABS: 50.0000 mg | ORAL_TABLET | Freq: Three times a day (TID) | ORAL | 0 refills | Status: DC | PRN

## 2017-06-08 NOTE — Progress Notes (Signed)
   [facility] Phone: [phone]  Subjective:  Candice [name] is a 31 year old female presenting to clinic with right sided abdominal pain for the last 2-3 days. The abdominal pain is worsening. The pain feels similar to "coughing right after a C-section". The pain might be worse with urinating, but she is not sure. Nothing else makes the pain worse. Nothing makes the pain better. She endorses subjective fevers and chills. She also endorses nausea and vomiting. She endorses polyuria and polydipsia. She denies vaginal discharge or vaginal odor. She states she had her tubes untied on February 21st of this year. She is sexually active with one female partner. They have unprotected sexual intercourse and she is not on any birth control. Of note, she does have a history of hemorrhagic left ovarian cyst that was seen on pelvic US 09/2016.  ROS: See HPI for pertinent positives and negatives  Past Medical History- hx genital herpes, depression, obesity  Family history reviewed for today's visit. No changes.  Social history- patient is a current smoker  Objective: BP 104/66   Pulse (!) 101   Temp 98.2 F (36.8 C) (Oral)   Wt 192 lb (87.1 kg)   LMP 05/16/2017   SpO2 99%   BMI 34.01 kg/m  Gen: NAD, alert, cooperative with exam CV: Tachycardic, regular rhythm GI: +BS, +severe tenderness to palpation in the RLQ over McBurney's point, +rebound, +guarding, +Psoas sign. GU: External genitalia normal in appearance, vaginal walls normal, cervix normal in appearance, no cervical motion tenderness, no adnexal or uterine masses appreciated, +tenderness to palpation over the right adnexa.  Assessment/Plan: RLQ Pain: Concern for acute appendicitis, given that patient has point tenderness over McBurney's point with rebound and guarding. Could also be a ruptured ovarian cyst. Think PID is less likely without vaginal discharge or cervical motion tenderness. UA without blood, so think  nephrolithiasis is less likely. UA negative for infection, so don't think this is pyelonephritis. - Stat CT abdomen/pelvis with contrast ordered. Patient will have this done in a few hours. - Gonorrhea and chlamydia pending - Use Tylenol and Ibuprofen prn for pain - Tramadol #20 prescribed for severe/breakthrough pain. - Precepted with attending.   Hyman Bible, MD PGY-3

## 2017-06-08 NOTE — Patient Instructions (Signed)
It was so nice to meet you!  I am concerned that you have appendicitis. I have ordered a cat scan of your abdomen. I will call you with these results. If you do have appendicitis, you will need to go to the emergency department. If you don't have appendicitis, we will likely need to get other imaging to try to figure out what is going on.  I will call you with your other lab results.  -Dr. Brett Albino

## 2017-06-09 LAB — BASIC METABOLIC PANEL
BUN / CREAT RATIO: 13 (ref 9–23)
BUN: 11 mg/dL (ref 6–20)
CALCIUM: 10.1 mg/dL (ref 8.7–10.2)
CHLORIDE: 104 mmol/L (ref 96–106)
CO2: 26 mmol/L (ref 20–29)
Creatinine, Ser: 0.83 mg/dL (ref 0.57–1.00)
GFR calc Af Amer: 107 mL/min/{1.73_m2} (ref >59)
GFR calc non Af Amer: 93 mL/min/{1.73_m2} (ref >59)
GLUCOSE: 79 mg/dL (ref 65–99)
POTASSIUM: 4 mmol/L (ref 3.5–5.2)
Sodium: 142 mmol/L (ref 134–144)

## 2017-06-09 LAB — CERVICOVAGINAL ANCILLARY ONLY
Chlamydia: NEGATIVE
NEISSERIA GONORRHEA: NEGATIVE
TRICH (WINDOWPATH): NEGATIVE

## 2017-06-10 ENCOUNTER — Telehealth: Payer: Self-pay | Admitting: Internal Medicine

## 2017-06-10 ENCOUNTER — Encounter: Payer: Self-pay | Admitting: Internal Medicine

## 2017-06-10 NOTE — Telephone Encounter (Signed)
Will forward to Dr. Brett Albino. Jazmin Hartsell,CMA

## 2017-06-10 NOTE — Telephone Encounter (Signed)
Called patient to let her know that her lab results were completely normal. Patient voiced understanding.  Hyman Bible, MD PGY-3

## 2017-06-10 NOTE — Assessment & Plan Note (Signed)
Concern for acute appendicitis, given that patient has point tenderness over McBurney's point with rebound and guarding. Could also be a ruptured ovarian cyst. Think PID is less likely without vaginal discharge or cervical motion tenderness. - Stat CT abdomen/pelvis with contrast ordered. Patient will have this done in a few hours. - Gonorrhea and chlamydia pending - Use Tylenol and Ibuprofen prn for pain - Tramadol #20 prescribed for severe/breakthrough pain. - Precepted with attending.

## 2017-06-10 NOTE — Telephone Encounter (Signed)
Pt was seen 06-08-17 by dr Brett Albino.  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 She would like a drs note for the last 3 days.  Please let her know when she pick this up,  She goes to one job at 8 tomorrow morning and the other one she goes at 5:30.  Please advise

## 2017-06-10 NOTE — Telephone Encounter (Signed)
**  Late Note** Patient called in the afternoon of 12/17 and informed that her CT did not show appendicitis. The CT did show bowel wall thickening of the duodenum and jejunum without significant surrounding inflammatory changes, concerning for enteritis; however, this does not really fit with her clinical picture. CT also showed a 3.9 cm right ovarian mass, likely a hemorrhagic cyst. Patient will likely need a pelvic US in 6-8 weeks to follow-up this cyst. I advised patient to schedule an appointment with her PCP in 1 month for follow-up. Patient voiced understanding.  Hyman Bible, MD PGY-3

## 2017-06-10 NOTE — Telephone Encounter (Signed)
Work letter pended in patient's chart. Would you mind printing it and putting it up front for patient to pick up? Thank you!

## 2017-06-11 NOTE — Telephone Encounter (Signed)
Letter printed and placed up front.  Patient informed. Caledonia Zou,CMA

## 2017-06-23 HISTORY — PX: BREAST SURGERY: SHX581

## 2017-07-09 ENCOUNTER — Ambulatory Visit: Payer: Self-pay | Admitting: Internal Medicine

## 2017-07-25 ENCOUNTER — Emergency Department (HOSPITAL_COMMUNITY)
Admission: EM | Admit: 2017-07-25 | Discharge: 2017-07-25 | Disposition: A | Discharge status: Home / Self Care | Payer: BLUE CROSS/BLUE SHIELD | Attending: Emergency Medicine | Admitting: Emergency Medicine

## 2017-07-25 ENCOUNTER — Encounter (HOSPITAL_COMMUNITY): Payer: Self-pay | Admitting: Emergency Medicine

## 2017-07-25 ENCOUNTER — Emergency Department (HOSPITAL_COMMUNITY): Payer: BLUE CROSS/BLUE SHIELD

## 2017-07-25 DIAGNOSIS — R112 Nausea with vomiting, unspecified: Secondary | ICD-10-CM | POA: Insufficient documentation

## 2017-07-25 DIAGNOSIS — J069 Acute upper respiratory infection, unspecified: Secondary | ICD-10-CM | POA: Insufficient documentation

## 2017-07-25 DIAGNOSIS — R197 Diarrhea, unspecified: Secondary | ICD-10-CM | POA: Diagnosis not present

## 2017-07-25 DIAGNOSIS — F17210 Nicotine dependence, cigarettes, uncomplicated: Secondary | ICD-10-CM | POA: Insufficient documentation

## 2017-07-25 DIAGNOSIS — Z79899 Other long term (current) drug therapy: Secondary | ICD-10-CM | POA: Insufficient documentation

## 2017-07-25 DIAGNOSIS — R509 Fever, unspecified: Secondary | ICD-10-CM | POA: Diagnosis not present

## 2017-07-25 DIAGNOSIS — R05 Cough: Secondary | ICD-10-CM | POA: Diagnosis not present

## 2017-07-25 LAB — URINALYSIS, ROUTINE W REFLEX MICROSCOPIC
BILIRUBIN URINE: NEGATIVE
Glucose, UA: NEGATIVE mg/dL
Hgb urine dipstick: NEGATIVE
KETONES UR: NEGATIVE mg/dL
LEUKOCYTES UA: NEGATIVE
NITRITE: NEGATIVE
PROTEIN: NEGATIVE mg/dL
Specific Gravity, Urine: 1.014 (ref 1.005–1.030)
pH: 7 (ref 5.0–8.0)

## 2017-07-25 LAB — COMPREHENSIVE METABOLIC PANEL
ALK PHOS: 48 U/L (ref 38–126)
ALT: 21 U/L (ref 14–54)
AST: 18 U/L (ref 15–41)
Albumin: 3.9 g/dL (ref 3.5–5.0)
Anion gap: 8 (ref 5–15)
BILIRUBIN TOTAL: 0.4 mg/dL (ref 0.3–1.2)
BUN: 8 mg/dL (ref 6–20)
CALCIUM: 9.7 mg/dL (ref 8.9–10.3)
CO2: 27 mmol/L (ref 22–32)
Chloride: 104 mmol/L (ref 101–111)
Creatinine, Ser: 0.56 mg/dL (ref 0.44–1.00)
GFR calc Af Amer: >60 mL/min (ref >60)
Glucose, Bld: 96 mg/dL (ref 65–99)
POTASSIUM: 3.8 mmol/L (ref 3.5–5.1)
Sodium: 139 mmol/L (ref 135–145)
Total Protein: 8.1 g/dL (ref 6.5–8.1)

## 2017-07-25 LAB — CBC
HCT: 41.9 % (ref 36.0–46.0)
Hemoglobin: 14 g/dL (ref 12.0–15.0)
MCH: 28.3 pg (ref 26.0–34.0)
MCHC: 33.4 g/dL (ref 30.0–36.0)
MCV: 84.8 fL (ref 78.0–100.0)
PLATELETS: 309 10*3/uL (ref 150–400)
RBC: 4.94 MIL/uL (ref 3.87–5.11)
RDW: 15.1 % (ref 11.5–15.5)
WBC: 9 10*3/uL (ref 4.0–10.5)

## 2017-07-25 LAB — I-STAT BETA HCG BLOOD, ED (MC, WL, AP ONLY): I-stat hCG, quantitative: <5 m[IU]/mL (ref <5)

## 2017-07-25 LAB — LIPASE, BLOOD: Lipase: 45 U/L (ref 11–51)

## 2017-07-25 LAB — INFLUENZA PANEL BY PCR (TYPE A & B)
INFLAPCR: NEGATIVE
Influenza B By PCR: NEGATIVE

## 2017-07-25 MED ORDER — FLUTICASONE PROPIONATE 50 MCG/ACT NA SUSP: 1.0000 | Freq: Every day | NASAL | 2 refills | Status: DC

## 2017-07-25 MED ORDER — ONDANSETRON HCL 4 MG/2ML IJ SOLN
4.0000 mg | Freq: Once | INTRAMUSCULAR | Status: AC
  Administered 2017-07-25: 4 mg via INTRAVENOUS
  Filled 2017-07-25: qty 2

## 2017-07-25 MED ORDER — SODIUM CHLORIDE 0.9 % IV BOLUS (SEPSIS)
1000.0000 mL | Freq: Once | INTRAVENOUS | Status: AC
  Administered 2017-07-25: 1000 mL via INTRAVENOUS

## 2017-07-25 MED ORDER — ONDANSETRON 4 MG PO TBDP: 4.0000 mg | ORAL_TABLET | Freq: Three times a day (TID) | ORAL | 0 refills | Status: DC | PRN

## 2017-07-25 MED ORDER — KETOROLAC TROMETHAMINE 30 MG/ML IJ SOLN
30.0000 mg | Freq: Once | INTRAMUSCULAR | Status: AC
  Administered 2017-07-25: 30 mg via INTRAVENOUS
  Filled 2017-07-25: qty 1

## 2017-07-25 NOTE — ED Triage Notes (Signed)
Patient here with complaints of generalized body aches, nausea, vomiting since Wednesday. Denies flu shot. Denies chest pain, cough.

## 2017-07-25 NOTE — Discharge Instructions (Signed)
Use plan is as directed for nasal congestion.  Use Zofran as needed for nausea/vomiting.  You can take Tylenol or Ibuprofen as directed for pain. You can alternate Tylenol and Ibuprofen every 4 hours. If you take Tylenol at 1pm, then you can take Ibuprofen at 5pm. Then you can take Tylenol again at 9pm.   Follow-up with [facility] for [facility].  Return the emergency department for any persistent fever despite medication, vomiting with blood, black vomit, abdominal pain, chest pain, difficulty breathing or any other worsening or concerning symptoms.

## 2017-07-25 NOTE — ED Notes (Signed)
Pt aware of need for urine specimen and cannot provide one at this time. Will ask again in a little while.

## 2017-07-25 NOTE — ED Notes (Signed)
Patient ambulated to BR with no assistance.  

## 2017-07-25 NOTE — ED Notes (Signed)
Pt was given sprite and saltine crackers for PO/fluid challenge---- tolerating well.

## 2017-07-25 NOTE — ED Provider Notes (Signed)
[facility] DEPT Provider Note   CSN: [id number] Arrival date & time: 07/25/17  1129     History   Chief Complaint Chief Complaint  Patient presents with  . Generalized Body Aches  . Emesis  . Diarrhea    HPI Candice [name] is a 32 y.o. female who presents for evaluation of 4 days of generalized body aches, nausea/vomiting, diarrhea, cough, nasal congestion, rhinorrhea, fever.  Patient reports that she has had difficulty tolerating p.o. secondary to symptoms.  She states she has had several episodes of vomiting since onset of symptoms.  She states that the emesis is nonbilious.  She does report that today, she had vomit with small specks of blood in it.  No coffee-ground emesis.  Patient reports 3 episodes of loose stool since onset of symptoms.  No blood noted.  Patient reports that she has been intermittently running fever.  She states that last night, her fever around 101.0.  She has been taking Tylenol and ibuprofen for fever relief.  Her last dose was last night.  Additionally, patient reports nasal congestion, rhinorrhea.  She also reports a dry cough.  Patient denies any sore throat, difficulty breathing, chest pain, dysuria, hematuria.  The history is provided by the patient.    Past Medical History:  Diagnosis Date  . Complication of anesthesia    epidural with last pregnancy made entire left side numb, had to d/c  . Depression   . Headache   . HSV 06/28/2007  . Hx of migraines     Patient Active Problem List   Diagnosis Date Noted  . Chest wall pain 10/20/2016  . Hemorrhagic cyst of left ovary 10/14/2016  . Abdominal pain 04/16/2016  . Vaginal discharge 07/13/2015  . MRSA colonization 09/25/2014  . Musculoskeletal pain 09/25/2014  . Major depressive disorder, recurrent episode, moderate ([place]) 06/08/2013  . Genital herpes 02/16/2013  . Back pain 10/13/2011  . TOBACCO USER 03/31/2009  . OBESITY, NOS 08/20/2006    Past Surgical  History:  Procedure Laterality Date  . CESAREAN SECTION    . CESAREAN SECTION  09/07/2011   Procedure: CESAREAN SECTION;  Surgeon: Florian Buff, MD;  Location: [facility];  Service: Gynecology;  Laterality: N/A;  Repeat cesarean section with delivery of baby boy at 26. Apgars 9/9.  Bilateral tubal ligation.  . IUD REMOVAL  2005   in cervix  . TUBAL LIGATION    . tubal reanastomosis  08/13/2016    OB History    Gravida Para Term Preterm AB Living   2 2 2  0 0 2   SAB TAB Ectopic Multiple Live Births   0 0 0 0 2       Home Medications    Prior to Admission medications   Medication Sig Start Date End Date Taking? Authorizing Provider  Pheniramine-PE-APAP (THERAFLU FLU & SORE THROAT) 20-10-650 MG PACK Take 1 Package by mouth daily as needed (flu symptoms).   Yes [provider]  Phenyleph-Doxylamine-DM-APAP (NYQUIL SEVERE COLD/FLU) 5-6.25-10-325 MG/15ML LIQD Take 30 mLs by mouth at bedtime as needed (flu like symptoms).   Yes [provider]  Zinc Gluconate (COLD-EEZE) 13.3 MG LOZG Use as directed 1 lozenge in the mouth or throat daily as needed (flu symptoms).   Yes [provider]  cyclobenzaprine (FLEXERIL) 10 MG tablet Take 1 tablet (10 mg total) by mouth 2 (two) times daily as needed for muscle spasms. Patient not taking: Reported on 07/25/2017 03/23/17   [name]  Meredeth Ide, MD  fluticasone (FLONASE) 50 MCG/ACT nasal spray Place 1 spray into both nostrils daily. 07/25/17   Volanda Napoleon, PA-C  naproxen (NAPROSYN) 500 MG tablet Take 1 tablet (500 mg total) by mouth 2 (two) times daily with a meal. Patient not taking: Reported on 07/25/2017 03/17/17   Kinnie Feil, MD  ondansetron (ZOFRAN ODT) 4 MG disintegrating tablet Take 1 tablet (4 mg total) by mouth every 8 (eight) hours as needed for nausea or vomiting. 07/25/17   Volanda Napoleon, PA-C  traMADol (ULTRAM) 50 MG tablet Take 1 tablet (50 mg total) by mouth every 8 (eight) hours as needed. Patient not  taking: Reported on 07/25/2017 06/08/17   Mayo, Pete Pelt, MD  valACYclovir (VALTREX) 500 MG tablet TAKE 1 TABLET (500 MG) BY MOUTH TWICE A DAY FOR THREE DAYS AT THE FIRST SIGN OF AN OUTBREAK. Patient not taking: Reported on 07/25/2017 05/19/16   Archie Patten, MD    Family History Family History  Problem Relation Age of Onset  . Alcohol abuse Mother   . Drug abuse Mother   . Depression Mother   . Alcohol abuse Father   . Drug abuse Father   . Mental illness Sister        bipolar ptsd  . Schizophrenia Sister   . Diabetes Maternal Grandmother   . Hypertension Maternal Grandmother   . Cancer Maternal Grandmother        ovarian, breast and lung  . Depression Maternal Grandmother     Social History Social History   Tobacco Use  . Smoking status: Current Every Day Smoker    Packs/day: 0.25    Types: Cigarettes  . Smokeless tobacco: Never Used  Substance Use Topics  . Alcohol use: Yes    Alcohol/week: 0.0 oz    Comment: occasional  . Drug use: Yes    Frequency: 4.0 times per week    Types: Marijuana     Allergies   Patient has no known allergies.   Review of Systems Review of Systems  Constitutional: Positive for fever. Negative for chills.  Eyes: Negative for visual disturbance.  Respiratory: Negative for cough and shortness of breath.   Cardiovascular: Negative for chest pain.  Gastrointestinal: Positive for diarrhea, nausea and vomiting. Negative for abdominal pain.  Genitourinary: Negative for dysuria and hematuria.  Musculoskeletal: Positive for myalgias. Negative for back pain and neck pain.  Skin: Negative for rash.  Neurological: Negative for dizziness, weakness, numbness and headaches.  All other systems reviewed and are negative.    Physical Exam Updated Vital Signs BP 108/69   Pulse 63   Temp 97.8 F (36.6 C) (Oral)   Resp 18   Ht 5\' 3"  (1.6 m)   Wt 87.1 kg (192 lb)   LMP 07/20/2017   SpO2 100%   BMI 34.01 kg/m   Physical Exam    Constitutional: She is oriented to person, place, and time. She appears well-developed and well-nourished.  Appears uncomfortable but no acute distress   HENT:  Head: Normocephalic and atraumatic.  Mouth/Throat: Oropharynx is clear and moist. Mucous membranes are dry.  Eyes: Conjunctivae, EOM and lids are normal. Pupils are equal, round, and reactive to light.  Neck: Full passive range of motion without pain.  Cardiovascular: Normal rate, regular rhythm, normal heart sounds and normal pulses. Exam reveals no gallop and no friction rub.  No murmur heard. Pulmonary/Chest: Effort normal and breath sounds normal.  No evidence of respiratory distress. Able to speak in  full sentences without difficulty.  Abdominal: Soft. Normal appearance. There is no tenderness. There is no rigidity and no guarding.  Abdomen is soft, non-distended, non-tender.   Musculoskeletal: Normal range of motion.  Neurological: She is alert and oriented to person, place, and time.  Skin: Skin is warm and dry. Capillary refill takes less than 2 seconds.  Psychiatric: She has a normal mood and affect. Her speech is normal.  Nursing note and vitals reviewed.    ED Treatments / Results  Labs (all labs ordered are listed, but only abnormal results are displayed) Labs Reviewed  LIPASE, BLOOD  COMPREHENSIVE METABOLIC PANEL  CBC  URINALYSIS, ROUTINE W REFLEX MICROSCOPIC  INFLUENZA PANEL BY PCR (TYPE A & B)  I-STAT BETA HCG BLOOD, ED ([facility], [facility], [facility] ONLY)    EKG  EKG Interpretation None       Radiology Dg Chest 2 View  Result Date: 07/25/2017 CLINICAL DATA:  Nonproductive cough for 4 days. EXAM: CHEST  2 VIEW COMPARISON:  09/18/2016 FINDINGS: The heart size and mediastinal contours are within normal limits. Both lungs are clear. The visualized skeletal structures are unremarkable. IMPRESSION: No active cardiopulmonary disease. Electronically Signed   By: Kathreen Devoid   On: 07/25/2017 17:18    Procedures Procedures  (including critical care time)  Medications Ordered in ED Medications  sodium chloride 0.9 % bolus 1,000 mL (0 mLs Intravenous Stopped 07/25/17 1933)  ketorolac (TORADOL) 30 MG/ML injection 30 mg (30 mg Intravenous Given 07/25/17 1623)  ondansetron (ZOFRAN) injection 4 mg (4 mg Intravenous Given 07/25/17 1706)     Initial Impression / Assessment and Plan / ED Course  I have reviewed the triage vital signs and the nursing notes.  Pertinent labs & imaging results that were available during my care of the patient were reviewed by me and considered in my medical decision making (see chart for details).     32 y.o. F who presents with 4 days of generalized body aches, fever, nausea/vomiting, cough, congestion, rhinorrhea.  Patient states that she has had episodes of vomiting since symptoms onset.  Patient states that today, she had an episode of vomiting that looked like food but then had small particles of blood in it.  No gross hem hematemesis, coffee-ground emesis.  She has not had any melena or bright red blood per rectum.  Patient also reports that she has had a dry cough.  I do not suspect GI bleed at this time given history/physical exam.  Suspect that was due to consistent coughing. Patient is afebrile, non-toxic appearing, sitting comfortably on examination table. Vital signs reviewed and stable. Consider upper respiratory infection versus pneumonia versus bronchitis versus flu.  History/physical exam is not concerning for strep.  Initial labs ordered at triage.  We will add additional chest x-ray, flu.  We will plan to give IVF in the department.  Analgesics and antiemetics given for symptomatic relief.  Labs and imaging reviewed. I-stat beta neg. CMP unremarkable. UA negative for any acute infectious etiology. Lipase is normal. CBC without any abnormalities.  Chest x-rays negative for any acute infectious etiology.  Flu is negative.  Discussed results with patient.  She reports improvement after  antiemetics, fluids, analgesics.  Plan to p.o. challenge patient in the department.  Patient able to tolerate p.o. without any difficulty.  No episodes of vomiting here in the department.  Vital signs are stable.  Patient reports improvement in symptoms.  Patient stable for discharge at this time.  Plan to provide symptomatic  relief for discharge home.  Encourage patient to follow-up with her primary care doctor in the next 24-48 hours for further evaluation. Patient had ample opportunity for questions and discussion. All patient's questions were answered with full understanding. Strict return precautions discussed. Patient expresses understanding and agreement to plan.      Final Clinical Impressions(s) / ED Diagnoses   Final diagnoses:  Upper respiratory tract infection, unspecified type  Nausea and vomiting, intractability of vomiting not specified, unspecified vomiting type    ED Discharge Orders        Ordered    fluticasone (FLONASE) 50 MCG/ACT nasal spray  Daily     07/25/17 1959    ondansetron (ZOFRAN ODT) 4 MG disintegrating tablet  Every 8 hours PRN     07/25/17 1959       Volanda Napoleon, PA-C 07/25/17 2215    Gareth Morgan, MD 07/26/17 2308

## 2017-07-31 ENCOUNTER — Encounter: Payer: Self-pay | Admitting: Internal Medicine

## 2017-07-31 ENCOUNTER — Ambulatory Visit (INDEPENDENT_AMBULATORY_CARE_PROVIDER_SITE_OTHER): Payer: BLUE CROSS/BLUE SHIELD | Admitting: Internal Medicine

## 2017-07-31 VITALS — BP 106/60 | HR 64 | Temp 98.2°F | Ht 63.0 in | Wt 192.6 lb

## 2017-07-31 DIAGNOSIS — M545 Low back pain, unspecified: Secondary | ICD-10-CM

## 2017-07-31 DIAGNOSIS — N83202 Unspecified ovarian cyst, left side: Secondary | ICD-10-CM | POA: Diagnosis not present

## 2017-07-31 MED ORDER — CYCLOBENZAPRINE HCL 10 MG PO TABS: 10.0000 mg | ORAL_TABLET | Freq: Two times a day (BID) | ORAL | 0 refills | Status: DC | PRN

## 2017-07-31 NOTE — Progress Notes (Signed)
   [facility] [name], MD Phone: [phone]  Reason For Visit:  Follow up Chronic Back Pain   # Chronic back pain  -Years of chronic back pain.  She states that it starts in her neck and goes all the way down her mid back.  No radiculopathy.  - Takes Alieve, tylenol - switching out between two  - Has been doing stretches for the pain that she has previously received from physical therapy -She takes Flexeril as needed however is currently out would like a new prescription of this -Would like to follow-up with plastic surgery to discuss breast reduction as this would likely help with the muscle spasm  #Right Ovarian hemorrhagic cyst -Abdominal pain has completely resolved or abdominal fullness -Denies any further symptoms denies any weight loss fevers -No history of ovarian cancer in the family,grandmother with breast cancer in her late 70s  Past Medical History Reviewed problem list.  Medications- reviewed and updated No additions to family history Social history- patient is a non- smoker  Objective: BP 106/60 (BP Location: Right Arm, Patient Position: Sitting, Cuff Size: Normal)   Pulse 64   Temp 98.2 F (36.8 C) (Oral)   Ht 5\' 3"  (1.6 m)   Wt 192 lb 9.6 oz (87.4 kg)   LMP 07/14/2017 (Exact Date)   SpO2 99%   BMI 34.12 kg/m  Gen: NAD, alert, cooperative with exam GI: soft, non-tender, non-distended, bowel sounds present, no hepatomegaly, no splenomegaly Back:  Skin: dry, intact, no rashes or lesions Neuro: Strength and sensation grossly intact   Assessment/Plan: See problem based a/p  Back pain Patient with chronic back pain, with a large breasts likely causing some of the muscle spasms patient has -Continue stretching -Use Flexeril as needed -Follow-up with plastic surgery for evaluation  Hemorrhagic cyst of left ovary CT abdomen pelvis significant for hemorrhagic cyst.  Denies any symptoms Follow-up as needed for lower abdominal  pain Patient presents with pain consider repeat ultrasound to recheck on cyst

## 2017-07-31 NOTE — Assessment & Plan Note (Addendum)
Patient with chronic back pain, with a large breasts likely causing some of the muscle spasms patient has -Continue stretching -Use Flexeril as needed -Follow-up with plastic surgery for evaluation

## 2017-07-31 NOTE — Patient Instructions (Addendum)
I have placed a referral for plastic surgery.  They should call you in the next week for this referral.  I have also represcribed your Flexeril.  Please let me know if you need anything else.  I do not believe there is anything further to do about this hemorrhagic cyst, if you develop symptoms please return for follow up and we will consider repeat imaging.

## 2017-07-31 NOTE — Assessment & Plan Note (Addendum)
CT abdomen pelvis significant for hemorrhagic cyst.  Denies any symptoms. Has had cyst present in ultrasound since  2017 first on the left side - which resolved. Now on the right sided  -Will obtain repeat ultrasound approximately 3 months out after menses

## 2017-08-03 ENCOUNTER — Telehealth: Payer: Self-pay

## 2017-08-03 NOTE — Telephone Encounter (Signed)
Called patient to inform her of ultrasound 08/05/2017 @1545  and she told me that she is supposed to wait until her next menstrual cycle (March) per discussion with Dr. Emmaline Life. I was not aware of this time frame and gave the number of scheduling to patient to cancel appointment and reschedule when it is convenient for Candice [name].Ozella Almond, CMA

## 2017-08-05 ENCOUNTER — Ambulatory Visit (HOSPITAL_COMMUNITY): Payer: BLUE CROSS/BLUE SHIELD

## 2017-08-24 DIAGNOSIS — N62 Hypertrophy of breast: Secondary | ICD-10-CM | POA: Diagnosis not present

## 2017-08-28 ENCOUNTER — Other Ambulatory Visit: Payer: Self-pay | Admitting: Internal Medicine

## 2017-08-28 DIAGNOSIS — A6000 Herpesviral infection of urogenital system, unspecified: Secondary | ICD-10-CM

## 2017-08-28 NOTE — Telephone Encounter (Signed)
Pt needs refill on her Valtrex sent to [facility] on [address] today.

## 2017-08-31 MED ORDER — VALACYCLOVIR HCL 500 MG PO TABS: ORAL_TABLET | ORAL | 5 refills | Status: DC

## 2017-09-15 ENCOUNTER — Ambulatory Visit (HOSPITAL_COMMUNITY)
Admission: RE | Admit: 2017-09-15 | Discharge: 2017-09-15 | Disposition: A | Discharge status: Home / Self Care | Payer: BLUE CROSS/BLUE SHIELD | Source: Ambulatory Visit | Attending: Family Medicine | Admitting: Family Medicine

## 2017-09-15 DIAGNOSIS — N83202 Unspecified ovarian cyst, left side: Secondary | ICD-10-CM | POA: Diagnosis not present

## 2017-09-15 DIAGNOSIS — D259 Leiomyoma of uterus, unspecified: Secondary | ICD-10-CM | POA: Diagnosis not present

## 2017-10-16 ENCOUNTER — Other Ambulatory Visit (HOSPITAL_COMMUNITY)
Admission: RE | Admit: 2017-10-16 | Discharge: 2017-10-16 | Disposition: A | Discharge status: Home / Self Care | Payer: BLUE CROSS/BLUE SHIELD | Source: Ambulatory Visit | Attending: Family Medicine | Admitting: Family Medicine

## 2017-10-16 ENCOUNTER — Encounter: Payer: Self-pay | Admitting: Internal Medicine

## 2017-10-16 ENCOUNTER — Ambulatory Visit (INDEPENDENT_AMBULATORY_CARE_PROVIDER_SITE_OTHER): Payer: BLUE CROSS/BLUE SHIELD | Admitting: Internal Medicine

## 2017-10-16 ENCOUNTER — Other Ambulatory Visit: Payer: Self-pay

## 2017-10-16 VITALS — BP 108/88 | HR 80 | Temp 99.1°F | Ht 63.0 in | Wt 194.4 lb

## 2017-10-16 DIAGNOSIS — Z7251 High risk heterosexual behavior: Secondary | ICD-10-CM

## 2017-10-16 DIAGNOSIS — Z124 Encounter for screening for malignant neoplasm of cervix: Secondary | ICD-10-CM

## 2017-10-16 DIAGNOSIS — E6609 Other obesity due to excess calories: Secondary | ICD-10-CM | POA: Diagnosis not present

## 2017-10-16 DIAGNOSIS — R109 Unspecified abdominal pain: Secondary | ICD-10-CM | POA: Diagnosis not present

## 2017-10-16 DIAGNOSIS — Z1151 Encounter for screening for human papillomavirus (HPV): Secondary | ICD-10-CM | POA: Diagnosis not present

## 2017-10-16 LAB — POCT URINALYSIS DIP (MANUAL ENTRY)
BILIRUBIN UA: NEGATIVE mg/dL
Bilirubin, UA: NEGATIVE
Blood, UA: NEGATIVE
Glucose, UA: NEGATIVE mg/dL
Leukocytes, UA: NEGATIVE
Nitrite, UA: NEGATIVE
PH UA: 8 (ref 5.0–8.0)
SPEC GRAV UA: 1.015 (ref 1.010–1.025)
Urobilinogen, UA: 1 E.U./dL

## 2017-10-16 LAB — POCT WET PREP (WET MOUNT)
Clue Cells Wet Prep Whiff POC: NEGATIVE
TRICHOMONAS WET PREP HPF POC: ABSENT

## 2017-10-16 LAB — POCT URINE PREGNANCY: PREG TEST UR: NEGATIVE

## 2017-10-16 LAB — POCT UA - MICROSCOPIC ONLY

## 2017-10-16 NOTE — Patient Instructions (Addendum)
Discussed make sure to start taking prenatal vitamins.  Otherwise I think you are doing well.  I am happy to hear that they will be able to do that breast reduction for you.  We will either send you a letter with your results or call you about them in the next couple of weeks

## 2017-10-16 NOTE — Assessment & Plan Note (Signed)
Has recently had a reversed tubal ligation.  Not currently using any type of contraception.  Not using condoms.  Has had regular menstrual periods. -Discussed using prenatal vitamins if patient is interested in pregnancy -Will obtain a urine pregnancy test today

## 2017-10-16 NOTE — Assessment & Plan Note (Signed)
Patient did note some increased thirst and urination frequency Will obtain be met-screen for elevated blood sugar

## 2017-10-16 NOTE — Assessment & Plan Note (Signed)
Notable for some abdominal pain on palpation of uterus -denies having any pain in general just noted when I pressed on area. Given patient complains of 1 years of increased urinary frequency  - Will check UA - thought likely benign

## 2017-10-16 NOTE — Progress Notes (Signed)
Candice [name] is a 32 y.o. female presents to office today for annual physical exam examination.  Concerns today include:  1. Boil -patient has a history of boils, states that she has been treated with antibiotics in the past for this.  She recently developed boil near her inner thigh.  it is not red or swollen or painful currently. It has actually gone down in size some.  Last pap smear: 2016, trich noted at that time  Immunizations needed: None  Refills needed today: None   Women's Health  Periods:yes - regular  Contraception: No Pelvic symptoms: No Sexual activity: Yes  STD Screening: Yes  Pap smear status: Fine 2016 Exercise: None  Diet: Lunch: [employer]  Smoking: 5 cigarettes  Alcohol: Rarely  Drugs: THC+ Mood: No issues  Dentist: No tecently    Past Medical History:  Diagnosis Date  . Complication of anesthesia    epidural with last pregnancy made entire left side numb, had to d/c  . Depression   . Headache   . HSV 06/28/2007  . Hx of migraines    Social History   Socioeconomic History  . Marital status: Single    Spouse name: Not on file  . Number of children: Not on file  . Years of education: Not on file  . Highest education level: Not on file  Occupational History  . Not on file  Social Needs  . Financial resource strain: Not on file  . Food insecurity:    Worry: Not on file    Inability: Not on file  . Transportation needs:    Medical: Not on file    Non-medical: Not on file  Tobacco Use  . Smoking status: Current Every Day Smoker    Packs/day: 0.25    Types: Cigarettes  . Smokeless tobacco: Never Used  Substance and Sexual Activity  . Alcohol use: Yes    Alcohol/week: 0.0 oz    Comment: occasional  . Drug use: Yes    Frequency: 4.0 times per week    Types: Marijuana  . Sexual activity: Yes    Birth control/protection: None  Lifestyle  . Physical activity:    Days per week: Not on file    Minutes per session: Not on file  . Stress:  Not on file  Relationships  . Social connections:    Talks on phone: Not on file    Gets together: Not on file    Attends religious service: Not on file    Active member of club or organization: Not on file    Attends meetings of clubs or organizations: Not on file    Relationship status: Not on file  . Intimate partner violence:    Fear of current or ex partner: Not on file    Emotionally abused: Not on file    Physically abused: Not on file    Forced sexual activity: Not on file  Other Topics Concern  . Not on file  Social History Narrative   Works as Glass blower/designer.   FOB of both children lives in [place]; will be some what involved but mother is no longer dating him.   Past Surgical History:  Procedure Laterality Date  . CESAREAN SECTION    . CESAREAN SECTION  09/07/2011   Procedure: CESAREAN SECTION;  Surgeon: Florian Buff, MD;  Location: [facility];  Service: Gynecology;  Laterality: N/A;  Repeat cesarean section with delivery of baby boy at 56. Apgars 9/9.  Bilateral tubal ligation.  [facility]  IUD REMOVAL  2005   in cervix  . TUBAL LIGATION    . tubal reanastomosis  08/13/2016   Family History  Problem Relation Age of Onset  . Alcohol abuse Mother   . Drug abuse Mother   . Depression Mother   . Alcohol abuse Father   . Drug abuse Father   . Mental illness Sister        bipolar ptsd  . Schizophrenia Sister   . Diabetes Maternal Grandmother   . Hypertension Maternal Grandmother   . Cancer Maternal Grandmother        ovarian, breast and lung  . Depression Maternal Grandmother     ROS: Positive for increased frequency of urination. Patient reports no  vision/ hearing changes,anorexia, weight change, fever ,adenopathy, persistant / recurrent hoarseness, swallowing issues, chest pain, edema,persistant / recurrent cough, hemoptysis, dyspnea(rest, exertional, paroxysmal nocturnal), gastrointestinal  bleeding (melena, rectal bleeding), abdominal pain, excessive heart burn, GU  symptoms(dysuria, hematuria, pyuria, voiding/incontinence  Issues) syncope, focal weakness, severe memory loss, concerning skin lesions, depression, anxiety, abnormal bruising/bleeding, major joint swelling, breast masses or abnormal vaginal bleeding.    Physical exam Physical Exam  Constitutional: She is oriented to person, place, and time. She appears well-developed and well-nourished.  HENT:  Head: Normocephalic and atraumatic.  Eyes: Pupils are equal, round, and reactive to light. EOM are normal.  Neck: Normal range of motion.  Cardiovascular: Normal rate and regular rhythm.  Pulmonary/Chest: Effort normal and breath sounds normal.  Abdominal: Soft. Bowel sounds are normal.  Musculoskeletal: Normal range of motion.  Neurological: She is alert and oriented to person, place, and time.  Skin: Skin is warm and dry.  Psychiatric: She has a normal mood and affect.    Assessment/ Plan: Patient here for annual physical exam.   Unprotected sex Has recently had a reversed tubal ligation.  Not currently using any type of contraception.  Not using condoms.  Has had regular menstrual periods. -Discussed using prenatal vitamins if patient is interested in pregnancy -Will obtain a urine pregnancy test today  Cervical cancer screening Would like STD testing - Cervicovaginal ancillary only - POCT Wet Prep ([place] [place]) - HIV antibody - RPR - Cytology - PAP([place])     OBESITY, NOS Patient did note some increased thirst and urination frequency Will obtain be met-screen for elevated blood sugar  Abdominal pain Notable for some abdominal pain on palpation of uterus -denies having any pain in general just noted when I pressed on area. Given patient complains of 1 years of increased urinary frequency  - Will check UA - thought likely benign     [place] PGY-3, [place]

## 2017-10-16 NOTE — Assessment & Plan Note (Addendum)
Would like STD testing - Cervicovaginal ancillary only - POCT Wet Prep ([facility] [place]) - HIV antibody - RPR - Cytology - PAP([place])

## 2017-10-17 LAB — BASIC METABOLIC PANEL
BUN/Creatinine Ratio: 12 (ref 9–23)
BUN: 9 mg/dL (ref 6–20)
CALCIUM: 9.4 mg/dL (ref 8.7–10.2)
CO2: 22 mmol/L (ref 20–29)
Chloride: 102 mmol/L (ref 96–106)
Creatinine, Ser: 0.74 mg/dL (ref 0.57–1.00)
GFR calc Af Amer: 123 mL/min/{1.73_m2} (ref >59)
GFR calc non Af Amer: 107 mL/min/{1.73_m2} (ref >59)
GLUCOSE: 85 mg/dL (ref 65–99)
Potassium: 4 mmol/L (ref 3.5–5.2)
Sodium: 138 mmol/L (ref 134–144)

## 2017-10-17 LAB — RPR: RPR: NONREACTIVE

## 2017-10-17 LAB — HIV ANTIBODY (ROUTINE TESTING W REFLEX): HIV SCREEN 4TH GENERATION: NONREACTIVE

## 2017-10-19 LAB — CERVICOVAGINAL ANCILLARY ONLY
CHLAMYDIA, DNA PROBE: NEGATIVE
Neisseria Gonorrhea: NEGATIVE

## 2017-10-20 LAB — CYTOLOGY - PAP
DIAGNOSIS: NEGATIVE
HPV: NOT DETECTED

## 2017-10-21 DIAGNOSIS — N62 Hypertrophy of breast: Secondary | ICD-10-CM | POA: Diagnosis not present

## 2017-10-23 ENCOUNTER — Other Ambulatory Visit: Payer: Self-pay

## 2017-10-23 ENCOUNTER — Emergency Department (HOSPITAL_COMMUNITY)
Admission: EM | Admit: 2017-10-23 | Discharge: 2017-10-23 | Disposition: A | Discharge status: Home / Self Care | Payer: BLUE CROSS/BLUE SHIELD | Attending: Physician Assistant | Admitting: Physician Assistant

## 2017-10-23 ENCOUNTER — Emergency Department (HOSPITAL_COMMUNITY): Payer: BLUE CROSS/BLUE SHIELD

## 2017-10-23 DIAGNOSIS — R0789 Other chest pain: Secondary | ICD-10-CM

## 2017-10-23 DIAGNOSIS — K2970 Gastritis, unspecified, without bleeding: Secondary | ICD-10-CM | POA: Diagnosis not present

## 2017-10-23 DIAGNOSIS — F17210 Nicotine dependence, cigarettes, uncomplicated: Secondary | ICD-10-CM | POA: Diagnosis not present

## 2017-10-23 DIAGNOSIS — Z79899 Other long term (current) drug therapy: Secondary | ICD-10-CM | POA: Diagnosis not present

## 2017-10-23 DIAGNOSIS — R079 Chest pain, unspecified: Secondary | ICD-10-CM | POA: Diagnosis not present

## 2017-10-23 LAB — I-STAT TROPONIN, ED
TROPONIN I, POC: 0 ng/mL (ref 0.00–0.08)
Troponin i, poc: 0.02 ng/mL (ref 0.00–0.08)

## 2017-10-23 LAB — COMPREHENSIVE METABOLIC PANEL
ALK PHOS: 40 U/L (ref 38–126)
ALT: 14 U/L (ref 14–54)
ANION GAP: 8 (ref 5–15)
AST: 17 U/L (ref 15–41)
Albumin: 3.7 g/dL (ref 3.5–5.0)
BUN: 8 mg/dL (ref 6–20)
CALCIUM: 9 mg/dL (ref 8.9–10.3)
CO2: 25 mmol/L (ref 22–32)
CREATININE: 0.66 mg/dL (ref 0.44–1.00)
Chloride: 106 mmol/L (ref 101–111)
GFR calc non Af Amer: >60 mL/min (ref >60)
Glucose, Bld: 90 mg/dL (ref 65–99)
Potassium: 3.4 mmol/L — ABNORMAL LOW (ref 3.5–5.1)
Sodium: 139 mmol/L (ref 135–145)
TOTAL PROTEIN: 6.9 g/dL (ref 6.5–8.1)
Total Bilirubin: 0.7 mg/dL (ref 0.3–1.2)

## 2017-10-23 LAB — CBC WITH DIFFERENTIAL/PLATELET
BASOS PCT: 0 %
Basophils Absolute: 0 10*3/uL (ref 0.0–0.1)
EOS PCT: 3 %
Eosinophils Absolute: 0.3 10*3/uL (ref 0.0–0.7)
HCT: 38.7 % (ref 36.0–46.0)
HEMOGLOBIN: 12.8 g/dL (ref 12.0–15.0)
Lymphocytes Relative: 23 %
Lymphs Abs: 2.9 10*3/uL (ref 0.7–4.0)
MCH: 27.9 pg (ref 26.0–34.0)
MCHC: 33.1 g/dL (ref 30.0–36.0)
MCV: 84.3 fL (ref 78.0–100.0)
MONOS PCT: 6 %
Monocytes Absolute: 0.8 10*3/uL (ref 0.1–1.0)
NEUTROS PCT: 68 %
Neutro Abs: 8.7 10*3/uL — ABNORMAL HIGH (ref 1.7–7.7)
PLATELETS: 271 10*3/uL (ref 150–400)
RBC: 4.59 MIL/uL (ref 3.87–5.11)
RDW: 15.2 % (ref 11.5–15.5)
WBC: 12.8 10*3/uL — AB (ref 4.0–10.5)

## 2017-10-23 LAB — LIPASE, BLOOD: Lipase: 45 U/L (ref 11–51)

## 2017-10-23 LAB — I-STAT BETA HCG BLOOD, ED (MC, WL, AP ONLY)

## 2017-10-23 MED ORDER — GI COCKTAIL ~~LOC~~
30.0000 mL | Freq: Once | ORAL | Status: AC
  Administered 2017-10-23: 30 mL via ORAL
  Filled 2017-10-23: qty 30

## 2017-10-23 MED ORDER — SODIUM CHLORIDE 0.9 % IV BOLUS
1000.0000 mL | Freq: Once | INTRAVENOUS | Status: AC
  Administered 2017-10-23: 1000 mL via INTRAVENOUS

## 2017-10-23 MED ORDER — FENTANYL CITRATE (PF) 100 MCG/2ML IJ SOLN
50.0000 ug | Freq: Once | INTRAMUSCULAR | Status: AC
  Administered 2017-10-23: 50 ug via INTRAVENOUS
  Filled 2017-10-23: qty 2

## 2017-10-23 NOTE — ED Provider Notes (Signed)
[facility] EMERGENCY DEPARTMENT Provider Note   CSN: [id number] Arrival date & time: 10/23/17  1253     History   Chief Complaint Chief Complaint  Patient presents with  . Chest Pain    HPI Candice [name] is a 32 y.o. female with a past medical history of tobacco use, obesity, who presents to ED for evaluation of 1 hour history of right-sided chest pain radiating to "entire right side of the body."  Symptoms began while she was laying down in bed unprovoked.  The pain began on the right side of her abdomen.  She reported some shortness of breath as well that has improved since arrival here in the ED.  She reports history of similar symptoms in the past 2 weeks ago during sexual intercourse.  During that time she stopped, prayed and the pain resolved.  She reports intermittent dry cough.  Denies any hemoptysis, palpitations, prior DVT, PE or MI, URI symptoms, fever, vomiting, injuries or falls, recent surgeries, recent prolonged travel, OCP use.  HPI  Past Medical History:  Diagnosis Date  . Complication of anesthesia    epidural with last pregnancy made entire left side numb, had to d/c  . Depression   . Headache   . HSV 06/28/2007  . Hx of migraines     Patient Active Problem List   Diagnosis Date Noted  . Unprotected sex 10/16/2017  . Hemorrhagic cyst of left ovary 10/14/2016  . Abdominal pain 04/16/2016  . MRSA colonization 09/25/2014  . Musculoskeletal pain 09/25/2014  . Major depressive disorder, recurrent episode, moderate ([place]) 06/08/2013  . Genital herpes 02/16/2013  . Back pain 10/13/2011  . Cervical cancer screening 10/10/2010  . TOBACCO USER 03/31/2009  . OBESITY, NOS 08/20/2006    Past Surgical History:  Procedure Laterality Date  . CESAREAN SECTION    . CESAREAN SECTION  09/07/2011   Procedure: CESAREAN SECTION;  Surgeon: Florian Buff, MD;  Location: [facility];  Service: Gynecology;  Laterality: N/A;  Repeat cesarean section with  delivery of baby boy at 79. Apgars 9/9.  Bilateral tubal ligation.  . IUD REMOVAL  2005   in cervix  . TUBAL LIGATION    . tubal reanastomosis  08/13/2016     OB History    Gravida  2   Para  2   Term  2   Preterm  0   AB  0   Living  2     SAB  0   TAB  0   Ectopic  0   Multiple  0   Live Births  2            Home Medications    Prior to Admission medications   Medication Sig Start Date End Date Taking? Authorizing Provider  cyclobenzaprine (FLEXERIL) 10 MG tablet Take 1 tablet (10 mg total) by mouth 2 (two) times daily as needed for muscle spasms. 07/31/17   Mikell, Jeani Sow, MD  fluticasone (FLONASE) 50 MCG/ACT nasal spray Place 1 spray into both nostrils daily. 07/25/17   Volanda Napoleon, PA-C  naproxen (NAPROSYN) 500 MG tablet Take 1 tablet (500 mg total) by mouth 2 (two) times daily with a meal. Patient not taking: Reported on 07/25/2017 03/17/17   Kinnie Feil, MD  ondansetron (ZOFRAN ODT) 4 MG disintegrating tablet Take 1 tablet (4 mg total) by mouth every 8 (eight) hours as needed for nausea or vomiting. 07/25/17   Volanda Napoleon, PA-C  Pheniramine-PE-APAP (  THERAFLU FLU & SORE THROAT) 20-10-650 MG PACK Take 1 Package by mouth daily as needed (flu symptoms).    [provider]  Phenyleph-Doxylamine-DM-APAP (NYQUIL SEVERE COLD/FLU) 5-6.25-10-325 MG/15ML LIQD Take 30 mLs by mouth at bedtime as needed (flu like symptoms).    [provider]  traMADol (ULTRAM) 50 MG tablet Take 1 tablet (50 mg total) by mouth every 8 (eight) hours as needed. Patient not taking: Reported on 07/25/2017 06/08/17   Mayo, Pete Pelt, MD  valACYclovir (VALTREX) 500 MG tablet TAKE 1 TABLET (500 MG) BY MOUTH TWICE A DAY FOR THREE DAYS AT THE FIRST SIGN OF AN OUTBREAK. 08/31/17   Mikell, Jeani Sow, MD  Zinc Gluconate (COLD-EEZE) 13.3 MG LOZG Use as directed 1 lozenge in the mouth or throat daily as needed (flu symptoms).    [provider]    Family  History Family History  Problem Relation Age of Onset  . Alcohol abuse Mother   . Drug abuse Mother   . Depression Mother   . Alcohol abuse Father   . Drug abuse Father   . Mental illness Sister        bipolar ptsd  . Schizophrenia Sister   . Diabetes Maternal Grandmother   . Hypertension Maternal Grandmother   . Cancer Maternal Grandmother        ovarian, breast and lung  . Depression Maternal Grandmother     Social History Social History   Tobacco Use  . Smoking status: Current Every Day Smoker    Packs/day: 0.25    Types: Cigarettes  . Smokeless tobacco: Never Used  Substance Use Topics  . Alcohol use: Yes    Alcohol/week: 0.0 oz    Comment: occasional  . Drug use: Yes    Frequency: 4.0 times per week    Types: Marijuana     Allergies   Patient has no known allergies.   Review of Systems Review of Systems  Constitutional: Negative for appetite change, chills and fever.  HENT: Negative for ear pain, rhinorrhea, sneezing and sore throat.   Eyes: Negative for photophobia and visual disturbance.  Respiratory: Positive for shortness of breath. Negative for cough, chest tightness and wheezing.   Cardiovascular: Positive for chest pain. Negative for palpitations.  Gastrointestinal: Positive for abdominal pain. Negative for blood in stool, constipation, diarrhea, nausea and vomiting.  Genitourinary: Negative for dysuria, hematuria and urgency.  Musculoskeletal: Negative for myalgias.  Skin: Negative for rash.  Neurological: Negative for dizziness, weakness and light-headedness.     Physical Exam Updated Vital Signs BP 110/82   Pulse 65   Temp 98.5 F (36.9 C) (Oral)   Resp 16   Ht 5\' 3"  (1.6 m)   Wt 88 kg (194 lb)   LMP 10/04/2017   SpO2 100%   BMI 34.37 kg/m   Physical Exam  Constitutional: She appears well-developed and well-nourished. No distress.  Patient tearful.  Nontoxic-appearing.  HENT:  Head: Normocephalic and atraumatic.  Nose: Nose  normal.  Eyes: Conjunctivae and EOM are normal. Right eye exhibits no discharge. Left eye exhibits no discharge. No scleral icterus.  Neck: Normal range of motion. Neck supple.  Cardiovascular: Normal rate, regular rhythm, normal heart sounds and intact distal pulses. Exam reveals no gallop and no friction rub.  No murmur heard. Pulmonary/Chest: Effort normal and breath sounds normal. No respiratory distress. She exhibits tenderness.    Abdominal: Soft. Bowel sounds are normal. She exhibits no distension. There is no tenderness. There is no guarding.  Musculoskeletal: Normal range of motion. She exhibits no edema.  Neurological: She is alert. She exhibits normal muscle tone. Coordination normal.  Skin: Skin is warm and dry. No rash noted.  Psychiatric: She has a normal mood and affect.  Nursing note and vitals reviewed.    ED Treatments / Results  Labs (all labs ordered are listed, but only abnormal results are displayed) Labs Reviewed  COMPREHENSIVE METABOLIC PANEL - Abnormal; Notable for the following components:      Result Value   Potassium 3.4 (*)    All other components within normal limits  CBC WITH DIFFERENTIAL/PLATELET - Abnormal; Notable for the following components:   WBC 12.8 (*)    Neutro Abs 8.7 (*)    All other components within normal limits  LIPASE, BLOOD  I-STAT BETA HCG BLOOD, ED ([facility], [facility], [facility] ONLY)  I-STAT TROPONIN, ED  I-STAT TROPONIN, ED    EKG EKG Interpretation  Date/Time:  Friday Oct 23 2017 12:59:45 EDT Ventricular Rate:  78 PR Interval:    QRS Duration: 83 QT Interval:  352 QTC Calculation: 401 R Axis:   50 Text Interpretation:  Sinus rhythm Normal sinus rhythm Confirmed by Thomasene Lot, [place] [phone]) on 10/23/2017 3:30:54 PM   Radiology Dg Chest 2 View  Result Date: 10/23/2017 CLINICAL DATA:  Pt brought in by [facility] from home for right sided CP that started x30 min ago. Pt states pain radiates down neck and right arm. Per EMS pt found laying in  her front yard. Pt states last time she felt this way it was during intercourse. EXAM: CHEST - 2 VIEW COMPARISON:  None. FINDINGS: Normal mediastinum and cardiac silhouette. Normal pulmonary vasculature. No evidence of effusion, infiltrate, or pneumothorax. No acute bony abnormality. IMPRESSION: No acute cardiopulmonary process. Electronically Signed   By: Suzy Bouchard M.D.   On: 10/23/2017 14:46    Procedures Procedures (including critical care time)  Medications Ordered in ED Medications  sodium chloride 0.9 % bolus 1,000 mL (0 mLs Intravenous Stopped 10/23/17 1651)  fentaNYL (SUBLIMAZE) injection 50 mcg (50 mcg Intravenous Given 10/23/17 1342)  gi cocktail (Maalox,Lidocaine,Donnatal) (30 mLs Oral Given 10/23/17 1342)     Initial Impression / Assessment and Plan / ED Course  I have reviewed the triage vital signs and the nursing notes.  Pertinent labs & imaging results that were available during my care of the patient were reviewed by me and considered in my medical decision making (see chart for details).  Clinical Course as of Oct 24 1651  Fri Oct 23, 2017  1557 Patient reports significant improvement in symptoms with meds given.  Resting comfortably.   [HK]    Clinical Course User Index [HK] Delia Heady, PA-C    Patient presents to ED for evaluation of 1 hour history of right-sided chest pain radiating to "entire right side of body."  Symptoms began approximately 30 minutes prior to arrival here.  Symptoms began when she was lying down in her bed.  Denies hemoptysis, palpitations, prior DVT or PE, prior MI, URI symptoms, fever, vomiting, recent surgeries, recent prolonged travel or OCP use.  On physical exam patient does have right-sided chest tenderness to palpation that is reproducible.  She is afebrile.  She is not hypoxic, tachycardic or tachypneic.  She is PERC and Wells criteria negative.  Lab work including initial and delta troponin, CBC, CMP, lipase, hCG all unremarkable.   Chest x-ray unremarkable.  Patient reports significant improvement in her symptoms with anti-inflammatories, GI cocktail and fluids given  here in the ED.  Vital signs remained within normal limits.  Suspect that her symptoms could be due to musculoskeletal pain or possible gastritis considering the symptoms improve with supportive measures.  I doubt cardiac or pulmonary cause of symptoms.  Advised to follow-up with PCP for further evaluation if symptoms persist.  Patient is agreeable to this plan.  Portions of this note were generated with Lobbyist. Dictation errors may occur despite best attempts at proofreading.   Final Clinical Impressions(s) / ED Diagnoses   Final diagnoses:  Chest wall pain  Gastritis without bleeding, unspecified chronicity, unspecified gastritis type    ED Discharge Orders    None       Delia Heady, PA-C 10/23/17 1658    Mackuen, Fredia Sorrow, MD 10/23/17 1845

## 2017-10-23 NOTE — ED Triage Notes (Signed)
Pt brought in by [facility] from home for right sided CP that started x30 min ago. Pt states pain radiates down neck and right arm. Per EMS pt found laying in her front yard. Pt states last time she felt this way it was during intercourse x2 weeks ago. Pt tearful during triage. Pt curled up, states it is painful to "stretch out". Pt A+Ox4.

## 2017-10-26 ENCOUNTER — Encounter: Payer: Self-pay | Admitting: Internal Medicine

## 2017-10-29 ENCOUNTER — Other Ambulatory Visit: Payer: Self-pay | Admitting: Plastic Surgery

## 2017-10-29 DIAGNOSIS — N62 Hypertrophy of breast: Secondary | ICD-10-CM | POA: Diagnosis not present

## 2018-02-23 ENCOUNTER — Other Ambulatory Visit: Payer: Self-pay

## 2018-03-01 MED ORDER — MUPIROCIN 2 % EX OINT: 1.0000 "application " | TOPICAL_OINTMENT | Freq: Two times a day (BID) | CUTANEOUS | 1 refills | Status: DC

## 2018-05-27 ENCOUNTER — Ambulatory Visit (HOSPITAL_COMMUNITY)
Admission: EM | Admit: 2018-05-27 | Discharge: 2018-05-27 | Disposition: A | Discharge status: Home / Self Care | Payer: BLUE CROSS/BLUE SHIELD | Attending: Family Medicine | Admitting: Family Medicine

## 2018-05-27 ENCOUNTER — Other Ambulatory Visit: Payer: Self-pay

## 2018-05-27 DIAGNOSIS — M25541 Pain in joints of right hand: Secondary | ICD-10-CM | POA: Diagnosis not present

## 2018-05-27 DIAGNOSIS — G5601 Carpal tunnel syndrome, right upper limb: Secondary | ICD-10-CM | POA: Diagnosis not present

## 2018-05-27 DIAGNOSIS — M654 Radial styloid tenosynovitis [de Quervain]: Secondary | ICD-10-CM | POA: Diagnosis not present

## 2018-05-27 MED ORDER — METHYLPREDNISOLONE 4 MG PO TBPK: ORAL_TABLET | ORAL | 0 refills | Status: DC

## 2018-05-27 MED ORDER — HYDROCODONE-ACETAMINOPHEN 5-325 MG PO TABS: 1.0000 | ORAL_TABLET | Freq: Four times a day (QID) | ORAL | 0 refills | Status: DC | PRN

## 2018-05-27 NOTE — Discharge Instructions (Signed)
Take the Medrol Dosepak as directed Take all of day 1 today Put ice on hand for 20 minutes every couple of hours through today Must be on light duty at work Take hydrocodone as needed for severe pain Do not take hydrocodone when driving or working  These injuries are likely due to overuse.  They may be related to your work.  Talk to your manager about referral to the Worker's Comp. clinic for follow-up

## 2018-05-27 NOTE — ED Provider Notes (Signed)
[facility]    CSN: [id number] Arrival date & time: 05/27/18  0920     History   Chief Complaint Chief Complaint  Patient presents with  . Hand Pain    HPI Candice [name] is a 32 y.o. female.   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  She uses her hands continuously throughout the shift.  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  She states after particularly strenuous day she developed some pain in her hand.  It is been persistent since then.  She has pain with movement of her thumb.  She has some numbness in her fingertips.  She has a knot and pain in her fifth finger PIP.  Diminished grip strength.  The pain kept her awake last night.  Her hand is swollen. She had previously been diagnosed and treated for carpal tunnel syndrome, treated with an injection.  This helped her for years. She previously had numbness in her hands and feet and was diagnosed with an unknown neuropathy.  It got better with a Medrol Dosepak. The pain is  awakening her at night. No hormonal changes of pregnancy.  No history of diabetes.  Past Medical History:  Diagnosis Date  . Complication of anesthesia    epidural with last pregnancy made entire left side numb, had to d/c  . Depression   . Headache   . HSV 06/28/2007  . Hx of migraines     Patient Active Problem List   Diagnosis Date Noted  . Unprotected sex 10/16/2017  . Hemorrhagic cyst of left ovary 10/14/2016  . Abdominal pain 04/16/2016  . MRSA colonization 09/25/2014  . Musculoskeletal pain 09/25/2014  . Major depressive disorder, recurrent episode, moderate ([place]) 06/08/2013  . Genital herpes 02/16/2013  . Back pain 10/13/2011  . Cervical cancer screening 10/10/2010  . TOBACCO USER 03/31/2009  . OBESITY, NOS 08/20/2006    Past Surgical History:  Procedure Laterality Date  . CESAREAN SECTION    . CESAREAN SECTION  09/07/2011   Procedure: CESAREAN SECTION;   Surgeon: Florian Buff, MD;  Location: [facility];  Service: Gynecology;  Laterality: N/A;  Repeat cesarean section with delivery of baby boy at 4. Apgars 9/9.  Bilateral tubal ligation.  . IUD REMOVAL  2005   in cervix  . TUBAL LIGATION    . tubal reanastomosis  08/13/2016    OB History    Gravida  2   Para  2   Term  2   Preterm  0   AB  0   Living  2     SAB  0   TAB  0   Ectopic  0   Multiple  0   Live Births  2            Home Medications    Prior to Admission medications   Medication Sig Start Date End Date Taking? Authorizing Provider  HYDROcodone-acetaminophen (NORCO/VICODIN) 5-325 MG tablet Take 1-2 tablets by mouth every 6 (six) hours as needed for severe pain. 05/27/18   Raylene Everts, MD  methylPREDNISolone (MEDROL DOSEPAK) 4 MG TBPK tablet tad 05/27/18   Raylene Everts, MD  Pheniramine-PE-APAP (THERAFLU FLU & SORE THROAT) 20-10-650 MG PACK Take 1 Package by mouth daily as needed (flu symptoms).    [provider]  Phenyleph-Doxylamine-DM-APAP (NYQUIL SEVERE COLD/FLU) 5-6.25-10-325 MG/15ML LIQD Take 30 mLs by mouth at bedtime as needed (  flu like symptoms).    [provider]  valACYclovir (VALTREX) 500 MG tablet TAKE 1 TABLET (500 MG) BY MOUTH TWICE A DAY FOR THREE DAYS AT THE FIRST SIGN OF AN OUTBREAK. 08/31/17   Mikell, Jeani Sow, MD  Zinc Gluconate (COLD-EEZE) 13.3 MG LOZG Use as directed 1 lozenge in the mouth or throat daily as needed (flu symptoms).    [provider]    Family History Family History  Problem Relation Age of Onset  . Alcohol abuse Mother   . Drug abuse Mother   . Depression Mother   . Alcohol abuse Father   . Drug abuse Father   . Mental illness Sister        bipolar ptsd  . Schizophrenia Sister   . Diabetes Maternal Grandmother   . Hypertension Maternal Grandmother   . Cancer Maternal Grandmother        ovarian, breast and lung  . Depression Maternal Grandmother     Social  History Social History   Tobacco Use  . Smoking status: Current Every Day Smoker    Packs/day: 0.25    Types: Cigarettes  . Smokeless tobacco: Never Used  Substance Use Topics  . Alcohol use: Yes    Comment: occasional  . Drug use: Yes    Frequency: 4.0 times per week    Types: Marijuana     Allergies   Patient has no known allergies.   Review of Systems Review of Systems  Constitutional: Negative for chills and fever.  HENT: Negative for ear pain and sore throat.   Eyes: Negative for pain and visual disturbance.  Respiratory: Negative for cough and shortness of breath.   Cardiovascular: Negative for chest pain and palpitations.  Gastrointestinal: Negative for abdominal pain and vomiting.  Genitourinary: Negative for dysuria and hematuria.  Musculoskeletal: Positive for arthralgias and joint swelling. Negative for back pain.  Skin: Negative for color change and rash.  Neurological: Positive for weakness and numbness. Negative for seizures and syncope.  Psychiatric/Behavioral: Positive for sleep disturbance.  All other systems reviewed and are negative.    Physical Exam Triage Vital Signs ED Triage Vitals  Enc Vitals Group     BP --      Pulse Rate 05/27/18 1047 66     Resp 05/27/18 1047 18     Temp 05/27/18 1047 98.3 F (36.8 C)     Temp Source 05/27/18 1047 Oral     SpO2 05/27/18 1047 100 %     Weight 05/27/18 1048 206 lb (93.4 kg)     Height --      Head Circumference --      Peak Flow --      Pain Score 05/27/18 1047 8     Pain Loc --      Pain Edu? --      Excl. in [place]? --    No data found.  Updated Vital Signs Pulse 66   Temp 98.3 F (36.8 C) (Oral)   Resp 18   Wt 93.4 kg   LMP 05/23/2018   SpO2 100%   BMI 36.49 kg/m       Physical Exam  Constitutional: She appears well-developed and well-nourished. No distress.  Appears uncomfortable  HENT:  Head: Normocephalic and atraumatic.  Mouth/Throat: Oropharynx is clear and moist.  Eyes:  Pupils are equal, round, and reactive to light. Conjunctivae are normal.  Neck: Normal range of motion. Neck supple.  Neck range of motion full.  Spurling negative.  Cardiovascular: Normal rate, regular rhythm and normal heart sounds.  Pulmonary/Chest: Effort normal and breath sounds normal. No respiratory distress.  Abdominal: Soft. She exhibits no distension.  Musculoskeletal: Normal range of motion. She exhibits no edema.  Patient has tenderness of the extensor compartment of the radial aspect of the wrist, pain with Finkelstein's testing and thumb flexion.  Positive pain with Phalen's testing.  Negative tilt Tinel's.  Limited range of motion of hand and fingers.  Limited range of motion of wrist.  Elbow exam is normal.  No Tinel at the cubital tunnel.  Neurological: She is alert.  Skin: Skin is warm and dry.  Psychiatric: She has a normal mood and affect. Her behavior is normal.     UC Treatments / Results  Labs (all labs ordered are listed, but only abnormal results are displayed) Labs Reviewed - No data to display  EKG None  Radiology No results found.  Procedures Procedures (including critical care time)  Medications Ordered in UC Medications - No data to display  Initial Impression / Assessment and Plan / UC Course  I have reviewed the triage vital signs and the nursing notes.  Pertinent labs & imaging results that were available during my care of the patient were reviewed by me and considered in my medical decision making (see chart for details).     Explained that she had carpal tunnel symptoms plus tenosynovitis symptoms.  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  She needs to report these to her company. Treatment is rest, ice, anti-inflammatories, brace.2 Final Clinical Impressions(s) / UC Diagnoses   Final diagnoses:  Carpal tunnel syndrome of right wrist  Radial styloid tenosynovitis of right hand   Arthralgia of right hand     Discharge Instructions     Take the Medrol Dosepak as directed Take all of day 1 today Put ice on hand for 20 minutes every couple of hours through today Must be on light duty at work Take hydrocodone as needed for severe pain Do not take hydrocodone when driving or working  These injuries are likely due to overuse.  They may be related to your work.  Talk to your manager about referral to the Worker's Comp. clinic for follow-up   ED Prescriptions    Medication Sig Dispense Auth. Provider   methylPREDNISolone (MEDROL DOSEPAK) 4 MG TBPK tablet tad 21 tablet Raylene Everts, MD   HYDROcodone-acetaminophen (NORCO/VICODIN) 5-325 MG tablet Take 1-2 tablets by mouth every 6 (six) hours as needed for severe pain. 15 tablet Raylene Everts, MD     Controlled Substance Prescriptions [place] Controlled Substance Registry consulted? Yes, I have consulted the [place] Controlled Substances Registry for this patient, and feel the risk/benefit ratio today is favorable for proceeding with this prescription for a controlled substance.   Raylene Everts, MD 05/27/18 [phone]

## 2018-05-27 NOTE — ED Triage Notes (Signed)
Pt cc pt states she has pain in her right hand along with swelling , tingling and numbness. Pt had this to happen years ago.

## 2018-06-08 ENCOUNTER — Encounter: Payer: Self-pay | Admitting: Family Medicine

## 2018-06-08 ENCOUNTER — Ambulatory Visit (INDEPENDENT_AMBULATORY_CARE_PROVIDER_SITE_OTHER): Payer: BLUE CROSS/BLUE SHIELD | Admitting: Family Medicine

## 2018-06-08 ENCOUNTER — Other Ambulatory Visit: Payer: Self-pay

## 2018-06-08 DIAGNOSIS — M25531 Pain in right wrist: Secondary | ICD-10-CM

## 2018-06-08 DIAGNOSIS — M25539 Pain in unspecified wrist: Secondary | ICD-10-CM | POA: Insufficient documentation

## 2018-06-08 LAB — POCT SEDIMENTATION RATE: POCT SED RATE: 13 mm/hr (ref 0–22)

## 2018-06-08 MED ORDER — DICLOFENAC POTASSIUM 50 MG PO TABS: 50.0000 mg | ORAL_TABLET | Freq: Two times a day (BID) | ORAL | 1 refills | Status: DC

## 2018-06-08 NOTE — Patient Instructions (Signed)
Good to see you today!  Thanks for coming in.  I think you have a combination of inflamatory tendonitis and carpal tunnel  - use the diclofenac 50 mg tab twice a day to three times a day.  Do not take with ibuprofen.   You can take with tylenol - Use warm compresses - wear the brace when doing lots of activity and at night but not most of the time during the day  I will call you if your tests are not good.  Otherwise I will send you a letter.  If you do not hear from me with in 2 weeks please call our office.     If not improving in 1 month or if worsening or not gone in 2 months then come back to see your regular doctor

## 2018-06-08 NOTE — Assessment & Plan Note (Signed)
Along with left ankle pain.  Seems consistent with overuse tendonitis in wrist along with likely carpal tunnel symptoms due to the inflammation.  Treat with antiinflammatories and range of motion and time.  Will check labs looking for systemic inflammatory causes.  None are evident on exam

## 2018-06-08 NOTE — Progress Notes (Signed)
Subjective  Candice [name] is a 32 y.o. female is presenting with the following  WRIST PAIN Having pain and swelling in R wrist for a month.  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 No specific trauma.  Also having intermittent pain in L ankle Seen in ER on 12-5.  Given medrol dose pack and hydrocodone and a brace that she has been using constantly since.  No rash no other joints involved. No fevers or weight loss.  Has history of carpal tunnel with injection about 8-9 years ago with similar arthritis type pain   Chief Complaint noted Review of Symptoms - see HPI PMH - Smoking status noted.    Objective Vital Signs reviewed BP 108/72   Pulse 71   Temp 98.4 F (36.9 C) (Oral)   Wt 201 lb (91.2 kg)   LMP 05/23/2018   SpO2 99%   BMI 35.61 kg/m  R wrist - decreased range of motion in all movements with decreased grip strength due to pain.  No specific focal tender area.  Not able to check phalens or tinels due to general pain with movement.  Sensation and pulses intact. Mild diffuse soft tissue swelling compared to left.  L ankle - focally tender laterally.  No deformity.  Able to walk on with midl pain. Skin:  Intact without suspicious lesions or rashes  Assessments/Plans  See after visit summary for details of patient instuctions  Wrist pain Along with left ankle pain.  Seems consistent with overuse tendonitis in wrist along with likely carpal tunnel symptoms due to the inflammation.  Treat with antiinflammatories and range of motion and time.  Will check labs looking for systemic inflammatory causes.  None are evident on exam

## 2018-06-09 ENCOUNTER — Encounter: Payer: Self-pay | Admitting: Family Medicine

## 2018-06-09 LAB — CBC
Hematocrit: 39.9 % (ref 34.0–46.6)
Hemoglobin: 12.8 g/dL (ref 11.1–15.9)
MCH: 26.5 pg — AB (ref 26.6–33.0)
MCHC: 32.1 g/dL (ref 31.5–35.7)
MCV: 83 fL (ref 79–97)
PLATELETS: 306 10*3/uL (ref 150–450)
RBC: 4.83 x10E6/uL (ref 3.77–5.28)
RDW: 15.3 % (ref 12.3–15.4)
WBC: 10.1 10*3/uL (ref 3.4–10.8)

## 2018-06-09 LAB — TSH: TSH: 0.741 u[IU]/mL (ref 0.450–4.500)

## 2018-06-14 ENCOUNTER — Encounter: Payer: Self-pay | Admitting: Student in an Organized Health Care Education/Training Program

## 2018-08-11 ENCOUNTER — Encounter (HOSPITAL_COMMUNITY): Payer: Self-pay

## 2018-08-11 ENCOUNTER — Other Ambulatory Visit: Payer: Self-pay

## 2018-08-11 ENCOUNTER — Emergency Department (HOSPITAL_COMMUNITY)
Admission: EM | Admit: 2018-08-11 | Discharge: 2018-08-11 | Disposition: A | Discharge status: Home / Self Care | Payer: BLUE CROSS/BLUE SHIELD | Attending: Emergency Medicine | Admitting: Emergency Medicine

## 2018-08-11 ENCOUNTER — Emergency Department (HOSPITAL_COMMUNITY): Payer: BLUE CROSS/BLUE SHIELD

## 2018-08-11 DIAGNOSIS — F17210 Nicotine dependence, cigarettes, uncomplicated: Secondary | ICD-10-CM | POA: Insufficient documentation

## 2018-08-11 DIAGNOSIS — R079 Chest pain, unspecified: Secondary | ICD-10-CM | POA: Diagnosis not present

## 2018-08-11 DIAGNOSIS — R0789 Other chest pain: Secondary | ICD-10-CM | POA: Diagnosis not present

## 2018-08-11 DIAGNOSIS — Z79899 Other long term (current) drug therapy: Secondary | ICD-10-CM | POA: Diagnosis not present

## 2018-08-11 LAB — CBC
HCT: 41.2 % (ref 36.0–46.0)
Hemoglobin: 12.7 g/dL (ref 12.0–15.0)
MCH: 26.5 pg (ref 26.0–34.0)
MCHC: 30.8 g/dL (ref 30.0–36.0)
MCV: 86 fL (ref 80.0–100.0)
PLATELETS: 326 10*3/uL (ref 150–400)
RBC: 4.79 MIL/uL (ref 3.87–5.11)
RDW: 15.8 % — ABNORMAL HIGH (ref 11.5–15.5)
WBC: 11.5 10*3/uL — ABNORMAL HIGH (ref 4.0–10.5)
nRBC: 0 % (ref 0.0–0.2)

## 2018-08-11 LAB — BASIC METABOLIC PANEL
ANION GAP: 6 (ref 5–15)
BUN: 11 mg/dL (ref 6–20)
CALCIUM: 9 mg/dL (ref 8.9–10.3)
CO2: 26 mmol/L (ref 22–32)
Chloride: 107 mmol/L (ref 98–111)
Creatinine, Ser: 0.55 mg/dL (ref 0.44–1.00)
GFR calc Af Amer: >60 mL/min (ref >60)
GLUCOSE: 95 mg/dL (ref 70–99)
Potassium: 3.7 mmol/L (ref 3.5–5.1)
SODIUM: 139 mmol/L (ref 135–145)

## 2018-08-11 LAB — I-STAT BETA HCG BLOOD, ED (NOT ORDERABLE): I-stat hCG, quantitative: <5 m[IU]/mL (ref <5)

## 2018-08-11 LAB — POCT I-STAT TROPONIN I: Troponin i, poc: 0.02 ng/mL (ref 0.00–0.08)

## 2018-08-11 MED ORDER — SODIUM CHLORIDE 0.9% FLUSH
3.0000 mL | Freq: Once | INTRAVENOUS | Status: AC
  Administered 2018-08-11: 3 mL via INTRAVENOUS

## 2018-08-11 MED ORDER — KETOROLAC TROMETHAMINE 30 MG/ML IJ SOLN
30.0000 mg | Freq: Once | INTRAMUSCULAR | Status: AC
  Administered 2018-08-11: 30 mg via INTRAVENOUS
  Filled 2018-08-11: qty 1

## 2018-08-11 MED ORDER — LIDOCAINE VISCOUS HCL 2 % MT SOLN
15.0000 mL | Freq: Once | OROMUCOSAL | Status: AC
  Administered 2018-08-11: 15 mL via ORAL
  Filled 2018-08-11: qty 15

## 2018-08-11 MED ORDER — OMEPRAZOLE 20 MG PO CPDR: 20.0000 mg | DELAYED_RELEASE_CAPSULE | Freq: Every day | ORAL | 0 refills | Status: DC

## 2018-08-11 MED ORDER — MORPHINE SULFATE (PF) 4 MG/ML IV SOLN: 4.0000 mg | Freq: Once | INTRAVENOUS | Status: DC

## 2018-08-11 MED ORDER — ALUM & MAG HYDROXIDE-SIMETH 200-200-20 MG/5ML PO SUSP
30.0000 mL | Freq: Once | ORAL | Status: AC
  Administered 2018-08-11: 30 mL via ORAL
  Filled 2018-08-11: qty 30

## 2018-08-11 MED ORDER — CYCLOBENZAPRINE HCL 10 MG PO TABS: 10.0000 mg | ORAL_TABLET | Freq: Two times a day (BID) | ORAL | 0 refills | Status: DC | PRN

## 2018-08-11 MED ORDER — ONDANSETRON HCL 4 MG/2ML IJ SOLN
4.0000 mg | Freq: Once | INTRAMUSCULAR | Status: AC
  Administered 2018-08-11: 4 mg via INTRAVENOUS
  Filled 2018-08-11: qty 2

## 2018-08-11 NOTE — ED Triage Notes (Signed)
Patient c/o constant  right chest pain that radiates into the right back since last night. Patient states manager at work made her leave so she could come to the ED  Patient states she has been having chest pain since 2017 and the physicians have not figured out why she is having chest pain.

## 2018-08-11 NOTE — ED Provider Notes (Signed)
[facility] DEPT Provider Note   CSN: [id number] Arrival date & time: 08/11/18  1006    History   Chief Complaint Chief Complaint  Patient presents with  . Chest Pain    HPI Candice [name] is a 33 y.o. female.     The history is provided by the patient and medical records. No language interpreter was used.     33 year old female with history of obesity, depression, tobacco use presenting for evaluation of chest pain.  Patient report gradual onset of right-sided chest pain that radiates to upper back, described as sharp and started yesterday.  Pain intensify throughout the day today with some shortness of breath some nausea.  She endorsed nonproductive cough.  She tries taking some airborne thinking it could be a cold as well as Tums and ibuprofen but her pain still persists.  Her manager recommend patient to come to ER for further evaluation.  She also endorsed some pressure headache, rates her pain is 8 out of 10.  She does not complain of any fever or chills, no exertional chest pain, no hemoptysis, no bowel bladder changes.  States she has had similar pain like this several times in the past usually associated with her ovarian cyst.  She is currently on her menstruation.  She denies any significant family history of cardiac disease.  No prior PE or DVT.  No sore throat or congestion.  She does smoke 5 cigarettes a day, minimal alcohol use.  She does admits to heavy lifting but denies any chest trauma.    Past Medical History:  Diagnosis Date  . Complication of anesthesia    epidural with last pregnancy made entire left side numb, had to d/c  . Depression   . Headache   . HSV 06/28/2007  . Hx of migraines     Patient Active Problem List   Diagnosis Date Noted  . Wrist pain 06/08/2018  . Unprotected sex 10/16/2017  . Hemorrhagic cyst of left ovary 10/14/2016  . MRSA colonization 09/25/2014  . Major depressive disorder, recurrent episode,  moderate ([place]) 06/08/2013  . Genital herpes 02/16/2013  . TOBACCO USER 03/31/2009  . OBESITY, NOS 08/20/2006    Past Surgical History:  Procedure Laterality Date  . BREAST SURGERY     reduction  . CESAREAN SECTION    . CESAREAN SECTION  09/07/2011   Procedure: CESAREAN SECTION;  Surgeon: Florian Buff, MD;  Location: [facility];  Service: Gynecology;  Laterality: N/A;  Repeat cesarean section with delivery of baby boy at 45. Apgars 9/9.  Bilateral tubal ligation.  . IUD REMOVAL  2005   in cervix  . TUBAL LIGATION    . tubal reanastomosis  08/13/2016     OB History    Gravida  2   Para  2   Term  2   Preterm  0   AB  0   Living  2     SAB  0   TAB  0   Ectopic  0   Multiple  0   Live Births  2            Home Medications    Prior to Admission medications   Medication Sig Start Date End Date Taking? Authorizing Provider  diclofenac (CATAFLAM) 50 MG tablet Take 1 tablet (50 mg total) by mouth 2 (two) times daily. 06/08/18   Lind Covert, MD  Phenyleph-Doxylamine-DM-APAP (NYQUIL SEVERE COLD/FLU) 5-6.25-10-325 MG/15ML LIQD Take 30 mLs by mouth  at bedtime as needed (flu like symptoms).    [provider]  valACYclovir (VALTREX) 500 MG tablet TAKE 1 TABLET (500 MG) BY MOUTH TWICE A DAY FOR THREE DAYS AT THE FIRST SIGN OF AN OUTBREAK. 08/31/17   Mikell, Jeani Sow, MD  Zinc Gluconate (COLD-EEZE) 13.3 MG LOZG Use as directed 1 lozenge in the mouth or throat daily as needed (flu symptoms).    [provider]    Family History Family History  Problem Relation Age of Onset  . Alcohol abuse Mother   . Drug abuse Mother   . Depression Mother   . Alcohol abuse Father   . Drug abuse Father   . Mental illness Sister        bipolar ptsd  . Schizophrenia Sister   . Diabetes Maternal Grandmother   . Hypertension Maternal Grandmother   . Cancer Maternal Grandmother        ovarian, breast and lung  . Depression Maternal Grandmother      Social History Social History   Tobacco Use  . Smoking status: Current Every Day Smoker    Packs/day: 0.25    Types: Cigarettes  . Smokeless tobacco: Never Used  Substance Use Topics  . Alcohol use: Yes    Comment: occasional  . Drug use: Yes    Frequency: 4.0 times per week    Types: Marijuana     Allergies   Patient has no known allergies.   Review of Systems Review of Systems  All other systems reviewed and are negative.    Physical Exam Updated Vital Signs BP 115/82 (BP Location: Right Arm)   Pulse 60   Temp 97.9 F (36.6 C) (Oral)   Resp 19   Ht 5\' 3"  (1.6 m)   Wt 90.7 kg   LMP 08/11/2018   SpO2 100%   BMI 35.43 kg/m   Physical Exam Vitals signs and nursing note reviewed.  Constitutional:      General: She is not in acute distress.    Appearance: She is well-developed.  HENT:     Head: Atraumatic.  Eyes:     Conjunctiva/sclera: Conjunctivae normal.  Neck:     Musculoskeletal: Neck supple.  Cardiovascular:     Rate and Rhythm: Normal rate and regular rhythm.     Pulses: Normal pulses.     Heart sounds: Normal heart sounds.  Pulmonary:     Effort: Pulmonary effort is normal.     Breath sounds: Normal breath sounds. No stridor. No wheezing or rhonchi.  Chest:     Chest wall: Tenderness (Tenderness about the palpation of her right chest wall without any overlying skin changes, crepitus, or emphysema.) present.  Abdominal:     Palpations: Abdomen is soft.     Tenderness: There is no abdominal tenderness.  Musculoskeletal:        General: No swelling or tenderness.  Skin:    General: Skin is warm.     Findings: No rash.  Neurological:     Mental Status: She is alert and oriented to person, place, and time.      ED Treatments / Results  Labs (all labs ordered are listed, but only abnormal results are displayed) Labs Reviewed  CBC - Abnormal; Notable for the following components:      Result Value   WBC 11.5 (*)    RDW 15.8 (*)     All other components within normal limits  BASIC METABOLIC PANEL  I-STAT TROPONIN, ED  I-STAT BETA  HCG BLOOD, ED ([facility], [facility], [facility] ONLY)  POCT I-STAT TROPONIN I  I-STAT BETA HCG BLOOD, ED (NOT ORDERABLE)    EKG EKG Interpretation  Date/Time:  Wednesday August 11 2018 10:17:26 EST Ventricular Rate:  62 PR Interval:    QRS Duration: 85 QT Interval:  385 QTC Calculation: 391 R Axis:   53 Text Interpretation:  Sinus rhythm Normal ECG Confirmed by Veryl Speak [phone]) on 08/11/2018 11:29:18 AM   Radiology Dg Chest 2 View  Result Date: 08/11/2018 CLINICAL DATA:  Chest pain EXAM: CHEST - 2 VIEW COMPARISON:  10/23/2017 FINDINGS: The heart size and mediastinal contours are within normal limits. Both lungs are clear. The visualized skeletal structures are unremarkable. IMPRESSION: No acute abnormality of the lungs.  No focal airspace opacity. Electronically Signed   By: Eddie Candle M.D.   On: 08/11/2018 11:35    Procedures Procedures (including critical care time)  Medications Ordered in ED Medications  sodium chloride flush (NS) 0.9 % injection 3 mL (3 mLs Intravenous Given 08/11/18 1208)  ondansetron (ZOFRAN) injection 4 mg (4 mg Intravenous Given 08/11/18 1324)  alum & mag hydroxide-simeth (MAALOX/MYLANTA) 200-200-20 MG/5ML suspension 30 mL (30 mLs Oral Given 08/11/18 1322)    And  lidocaine (XYLOCAINE) 2 % viscous mouth solution 15 mL (15 mLs Oral Given 08/11/18 1322)  ketorolac (TORADOL) 30 MG/ML injection 30 mg (30 mg Intravenous Given 08/11/18 1324)     Initial Impression / Assessment and Plan / ED Course  I have reviewed the triage vital signs and the nursing notes.  Pertinent labs & imaging results that were available during my care of the patient were reviewed by me and considered in my medical decision making (see chart for details).        BP 109/84   Pulse (!) 51   Temp 97.9 F (36.6 C) (Oral)   Resp 14   Ht 5\' 3"  (1.6 m)   Wt 90.7 kg   LMP 08/11/2018   SpO2 100%    BMI 35.43 kg/m    Final Clinical Impressions(s) / ED Diagnoses   Final diagnoses:  Chest wall pain    ED Discharge Orders         Ordered    cyclobenzaprine (FLEXERIL) 10 MG tablet  2 times daily PRN     08/11/18 1530    omeprazole (PRILOSEC) 20 MG capsule  Daily     08/11/18 1530          HEAR pathway score of 1, low risk of MACE.  PERC negative, doubt PE.  Reproducible chest wall pain likely musculoskeletal in origin.  Pregnancy test is negative.  Chest x-ray unremarkable.  Work-up initiated, symptomatic treatment provided.  2:56 PM Pt report improvement of sxs.  Work up unremarkable.  Suspect chest wall pain.  Plan to have pt f/u with PCP for further care.  Return precaution discussed.    Domenic Moras, PA-C 08/11/18 1530    Little, Wenda Overland, MD 08/11/18 [phone]

## 2018-09-07 IMAGING — US US PELVIS COMPLETE
1 series · 15 of 25 positions shown · non-contrast
Comparison: CT 06/08/2017

CLINICAL DATA: Low back pain

EXAM:
TRANSABDOMINAL AND TRANSVAGINAL ULTRASOUND OF PELVIS
TECHNIQUE: Both transabdominal and transvaginal ultrasound examinations of the
pelvis were performed. Transabdominal technique was performed for
global imaging of the pelvis including uterus, ovaries, adnexal
regions, and pelvic cul-de-sac. It was necessary to proceed with
endovaginal exam following the transabdominal exam to visualize the
uterus and ovaries.

[Series 1: us pelvis complete · 15 of 41 slices shown]
[im 1/41]
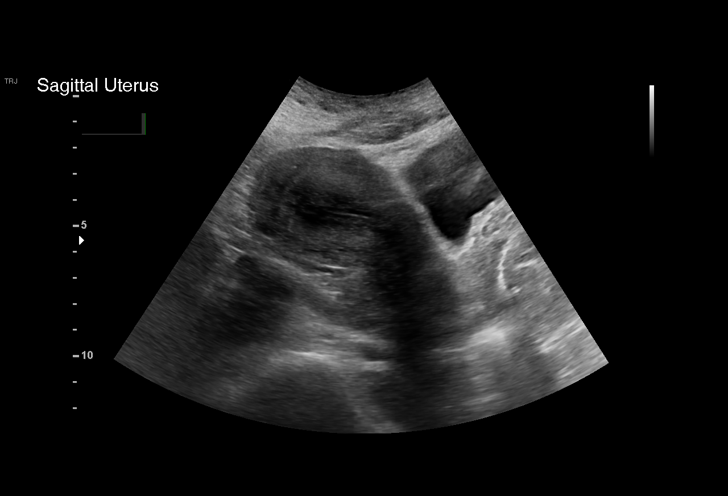
[im 4/41]
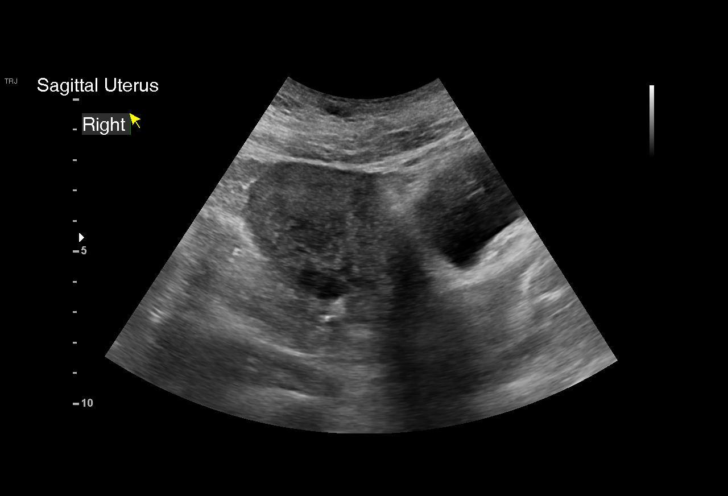
[im 7/41]
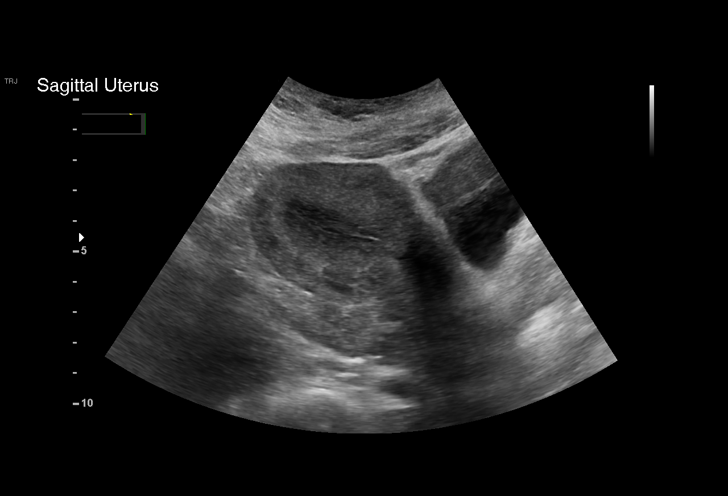
[im 9/41]
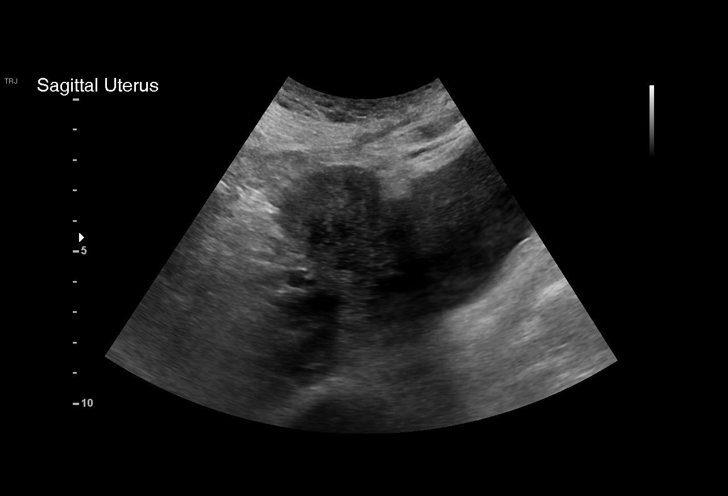
[im 12/41]
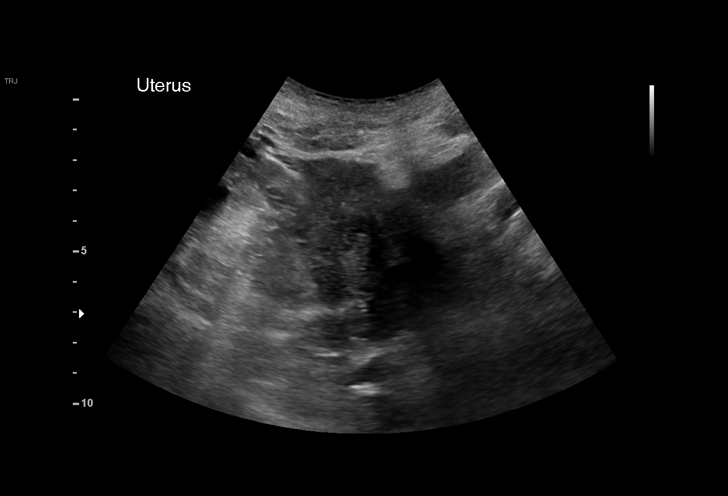
[im 16/41]
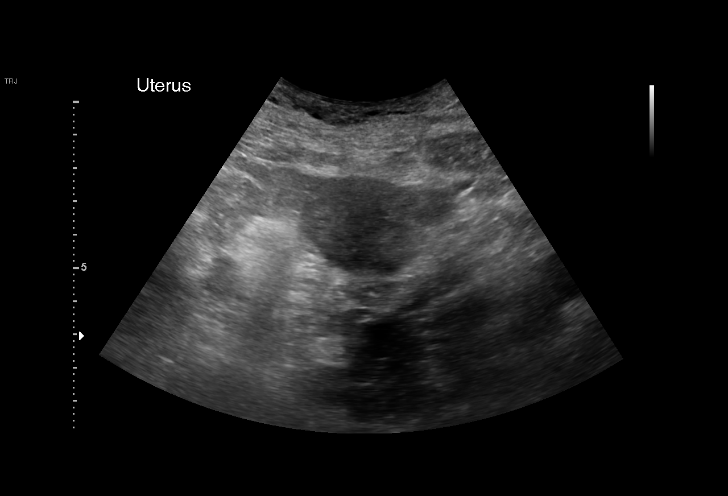
[im 17/41]
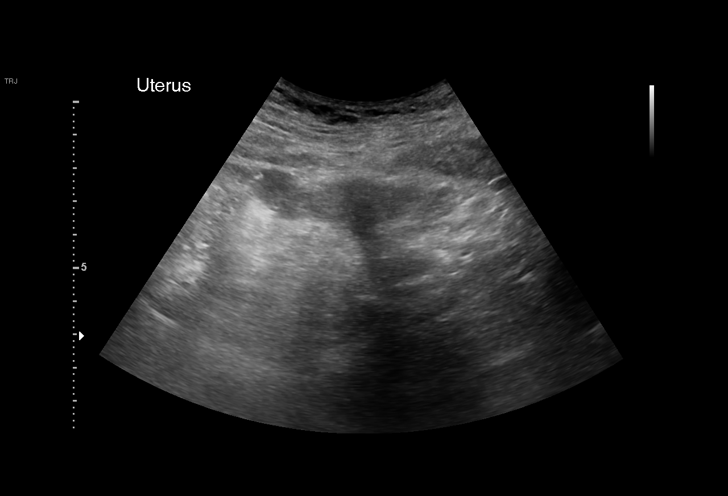
[im 21/41]
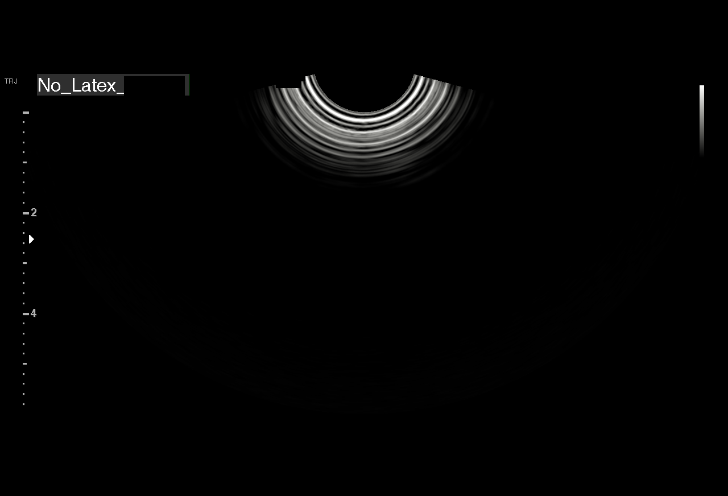
[im 24/41]
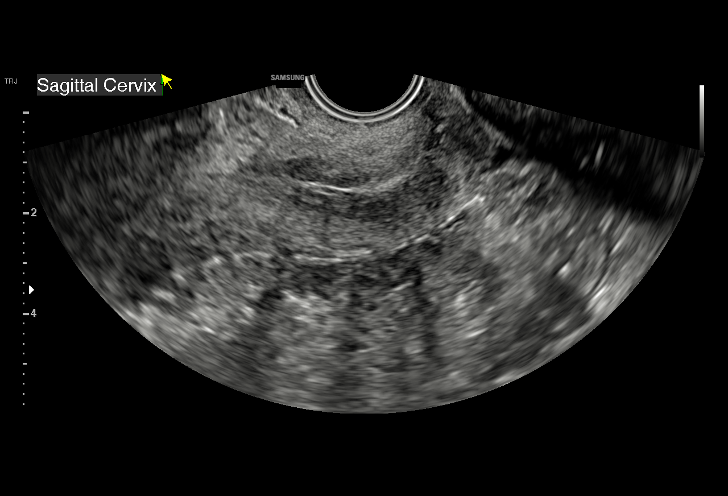
[im 26/41]
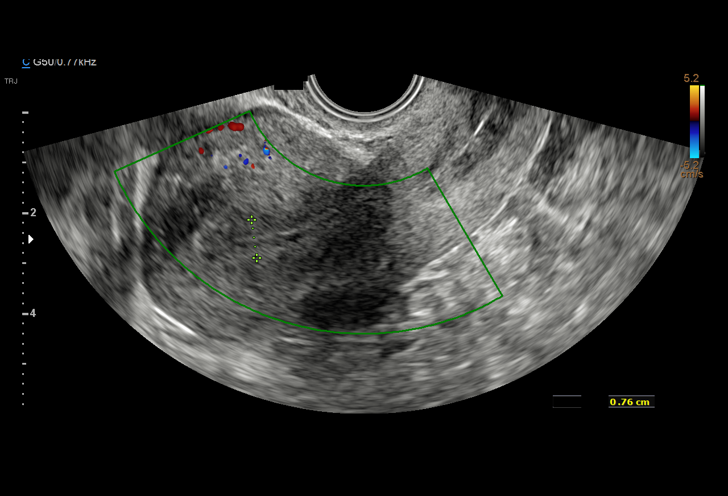
[im 29/41]
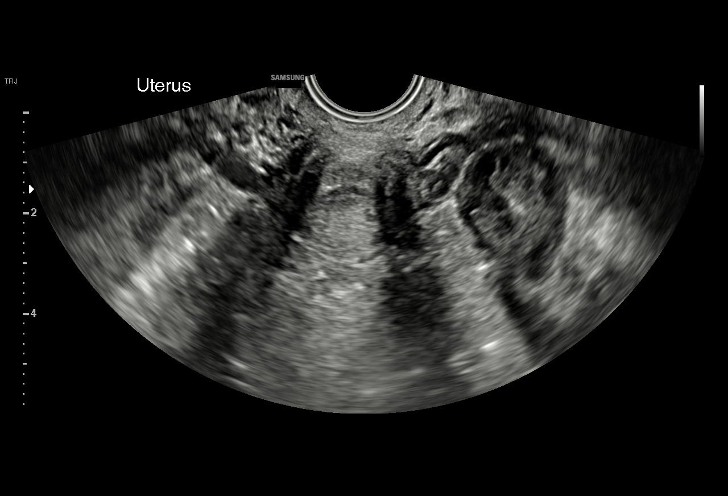
[im 32/41]
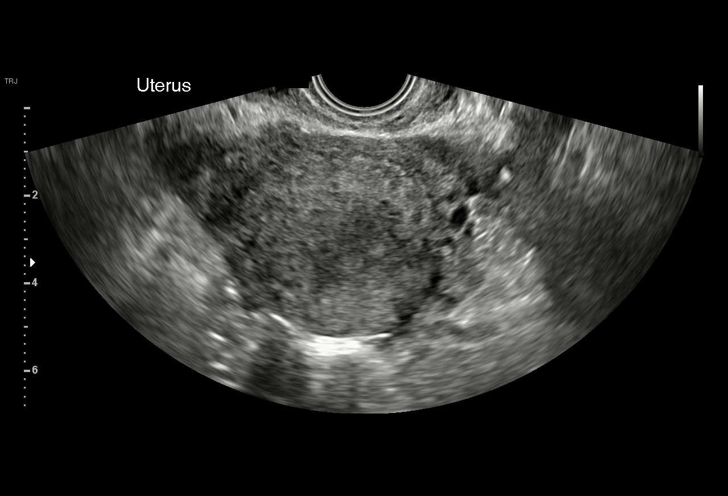
[im 34/41]
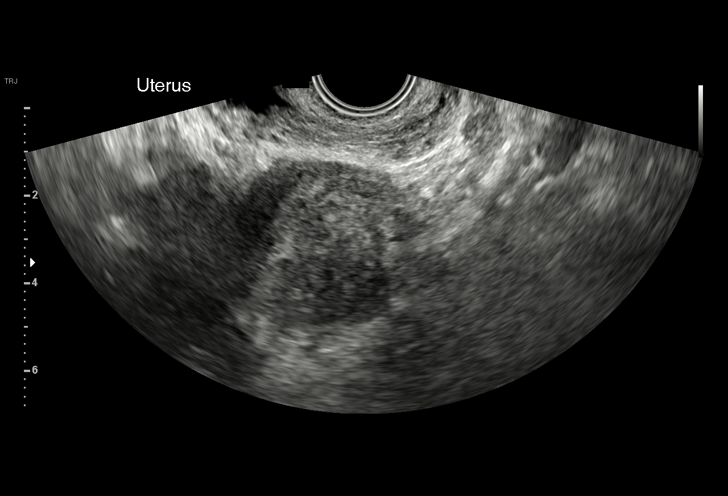
[im 37/41]
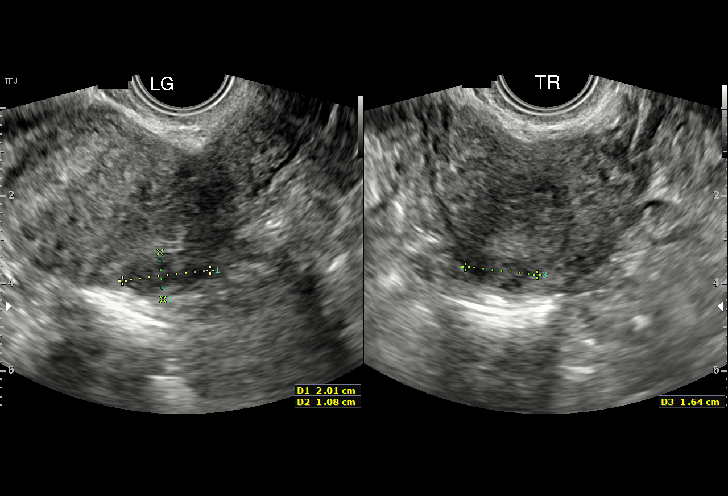
[im 41/41]
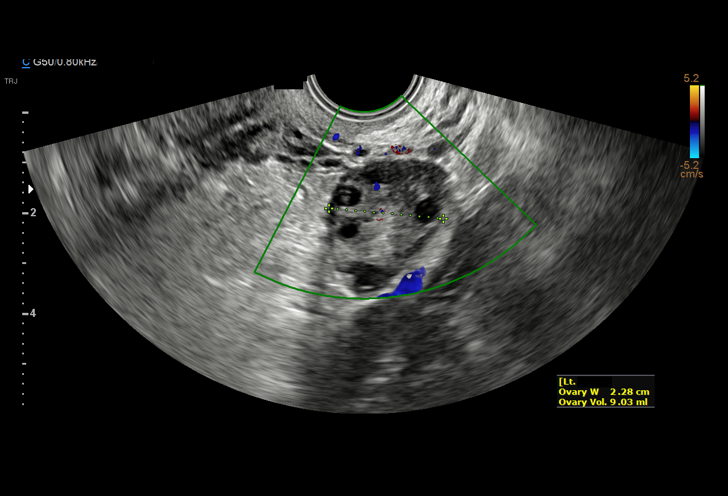

[15 of 25 positions shown; findings below may reference images not displayed]

FINDINGS: Uterus

Measurements: 10.1 x 4.5 x 5.8 cm. Small posterior right fibroid
measures up to 2.0 cm

Endometrium

Thickness: Normal thickness, 7 mm.  No focal abnormality visualized.

Right ovary

Measurements: 3.7 x 2.1 x 2.6 cm. Normal appearance/no adnexal mass.

Left ovary

Measurements: 3.4 x 2.3 x 2.3 cm. Normal appearance/no adnexal mass.

Other findings

No abnormal free fluid.
IMPRESSION: Small posterior uterine fibroid, 2 cm maximally.

No acute findings.

## 2018-09-21 ENCOUNTER — Other Ambulatory Visit: Payer: Self-pay | Admitting: *Deleted

## 2018-09-22 MED ORDER — VALACYCLOVIR HCL 500 MG PO TABS: 500.0000 mg | ORAL_TABLET | Freq: Two times a day (BID) | ORAL | 0 refills | Status: DC

## 2018-09-22 NOTE — Telephone Encounter (Signed)
2nd request

## 2018-09-22 NOTE — Telephone Encounter (Signed)
Pt called nurse line again checking on refill. Pt stated she is having an active outbreak and she has used all the refills that were given to her by her previous PCP ~1 year ago. Informed patient this is not currently on her med list, therefore she will have to make an apt with PCP soon.  Precepted with [name] who will give her one time treatment.

## 2018-11-04 ENCOUNTER — Other Ambulatory Visit: Payer: Self-pay | Admitting: Family Medicine

## 2018-11-04 ENCOUNTER — Other Ambulatory Visit: Payer: Self-pay | Admitting: Student in an Organized Health Care Education/Training Program

## 2018-11-05 ENCOUNTER — Other Ambulatory Visit: Payer: Self-pay | Admitting: Family Medicine

## 2018-11-05 NOTE — Telephone Encounter (Signed)
Preceptor for the day.  Asked to fill valtrex for current herpes outbreak.  I have sent in script.  Patient made appt to see PCP next week.

## 2018-11-05 NOTE — Telephone Encounter (Signed)
Pt calls to check status. Maree Ainley, CMA  

## 2018-11-05 NOTE — Telephone Encounter (Signed)
Pt informed and thankful.  Advised that we would not longer be able to give refills with appt on Wednesday.  Pt agreeable. Christen Bame, CMA

## 2018-11-10 ENCOUNTER — Ambulatory Visit (INDEPENDENT_AMBULATORY_CARE_PROVIDER_SITE_OTHER): Payer: BLUE CROSS/BLUE SHIELD | Admitting: Student in an Organized Health Care Education/Training Program

## 2018-11-10 ENCOUNTER — Other Ambulatory Visit: Payer: Self-pay

## 2018-11-10 ENCOUNTER — Encounter: Payer: Self-pay | Admitting: Student in an Organized Health Care Education/Training Program

## 2018-11-10 VITALS — BP 110/70 | HR 88

## 2018-11-10 DIAGNOSIS — A6004 Herpesviral vulvovaginitis: Secondary | ICD-10-CM

## 2018-11-10 DIAGNOSIS — S2001XA Contusion of right breast, initial encounter: Secondary | ICD-10-CM | POA: Diagnosis not present

## 2018-11-10 MED ORDER — VALACYCLOVIR HCL 500 MG PO TABS: 500.0000 mg | ORAL_TABLET | Freq: Two times a day (BID) | ORAL | 0 refills | Status: DC

## 2018-11-10 NOTE — Assessment & Plan Note (Signed)
No known trauma, <1 week. Increasing yellow and green color since it was noticed. Non-tender, no masses, dimpling or discharge - likely a bruise in late healing stage - asked patient to return if the skin lesion does not clear in a week or if it gets worse

## 2018-11-10 NOTE — Assessment & Plan Note (Addendum)
Prescribed valtrex 500mg  BID x3 days with 0 refills in anticipation for next outbreak. - patient opted to not take prophylaxis therapy at this time but wants to start it if she has another outbreak in the near future. - discussed safe/protected sex

## 2018-11-10 NOTE — Progress Notes (Signed)
   Subjective:    Patient ID: Candice [name], female    DOB: 04/07/1986, 33 y.o.   MRN: [id number]   CC: valtrex refill  HPI: Patient has recurrent genital herpes outbreaks. She is not currently having an outbreak but is out of her abortive medication. She typically experiences vulvovaginal lesions at least 4 times per year for many years but the outbreaks have become more frequent. She has had 2 outbreaks in the last 2 months. She denies any new stressors, medications, sexual partners. Her outbreaks have been typical every time. We discussed prophylaxis which she is considering at this time.  Additionally patient has concern over yellow mark on her breast that has been present less than 1 week. She had breast reduction surgery ~1 year ago without complication. She denies any known trauma to her breast. She has not noted any masses associated with mark. She denies any other skin changes such as dimpling. Denies nipple discharge. The mark is not painful to palpation.  Smoking status reviewed   ROS: pertinent noted in the HPI   Past Medical History:  Diagnosis Date  . Complication of anesthesia    epidural with last pregnancy made entire left side numb, had to d/c  . Depression   . Headache   . HSV 06/28/2007  . Hx of migraines     Past Surgical History:  Procedure Laterality Date  . BREAST SURGERY     reduction  . CESAREAN SECTION    . CESAREAN SECTION  09/07/2011   Procedure: CESAREAN SECTION;  Surgeon: Florian Buff, MD;  Location: [facility];  Service: Gynecology;  Laterality: N/A;  Repeat cesarean section with delivery of baby boy at 90. Apgars 9/9.  Bilateral tubal ligation.  . IUD REMOVAL  2005   in cervix  . TUBAL LIGATION    . tubal reanastomosis  08/13/2016    Past medical history, surgical, family, and social history reviewed and updated in the EMR as appropriate.  Objective:  BP 110/70   Pulse 88   LMP 11/03/2018   SpO2 96%   Vitals and nursing note reviewed   General: NAD, pleasant, able to participate in exam Cardiac: RRR, S1 S2 present. normal heart sounds, no murmurs. Respiratory: CTAB, normal effort, No wheezes, rales or rhonchi Breast: RUOQ of R breast has ~1cm diameter yellow/green non-blanchable, non-palpable discoloration. No masses, induration, skin dimpling appreciated. Non-tender to palpation Extremities: no edema or cyanosis. Skin: warm and dry, no rashes noted Neuro: alert, no obvious focal deficits Psych: Normal affect and mood   Assessment & Plan:    Genital herpes Prescribed valtrex 500mg  BID x3 days with 0 refills in anticipation for next outbreak. - patient opted to not take prophylaxis therapy at this time but wants to start it if she has another outbreak in the near future. - discussed safe/protected sex  Traumatic ecchymosis of right female breast No known trauma, <1 week. Increasing yellow and green color since it was noticed. Non-tender, no masses, dimpling or discharge - likely a bruise in late healing stage - asked patient to return if the skin lesion does not clear in a week or if it gets worse    Doristine Mango, [facility] PGY-1

## 2019-03-15 ENCOUNTER — Other Ambulatory Visit: Payer: Self-pay

## 2019-06-02 ENCOUNTER — Telehealth: Payer: Self-pay | Admitting: Student in an Organized Health Care Education/Training Program

## 2019-06-02 NOTE — Telephone Encounter (Signed)
Patient would like a refill on her Valtrex. Patient states she is having another outbreak and says "this is like a 911." Patient said she and the doctor discussed at her last visit about this being a long term refill. Please call patient with any questions.

## 2019-06-04 ENCOUNTER — Emergency Department (HOSPITAL_COMMUNITY)
Admission: EM | Admit: 2019-06-04 | Discharge: 2019-06-04 | Disposition: A | Discharge status: Home / Self Care | Payer: BLUE CROSS/BLUE SHIELD | Attending: Emergency Medicine | Admitting: Emergency Medicine

## 2019-06-04 ENCOUNTER — Emergency Department (HOSPITAL_COMMUNITY): Payer: BLUE CROSS/BLUE SHIELD

## 2019-06-04 ENCOUNTER — Other Ambulatory Visit: Payer: Self-pay

## 2019-06-04 ENCOUNTER — Encounter (HOSPITAL_COMMUNITY): Payer: Self-pay | Admitting: Student

## 2019-06-04 DIAGNOSIS — X500XXA Overexertion from strenuous movement or load, initial encounter: Secondary | ICD-10-CM | POA: Insufficient documentation

## 2019-06-04 DIAGNOSIS — F17210 Nicotine dependence, cigarettes, uncomplicated: Secondary | ICD-10-CM | POA: Insufficient documentation

## 2019-06-04 DIAGNOSIS — M545 Low back pain, unspecified: Secondary | ICD-10-CM

## 2019-06-04 DIAGNOSIS — M5489 Other dorsalgia: Secondary | ICD-10-CM | POA: Diagnosis not present

## 2019-06-04 DIAGNOSIS — M79606 Pain in leg, unspecified: Secondary | ICD-10-CM | POA: Diagnosis not present

## 2019-06-04 DIAGNOSIS — Z79899 Other long term (current) drug therapy: Secondary | ICD-10-CM | POA: Diagnosis not present

## 2019-06-04 LAB — POC URINE PREG, ED: Preg Test, Ur: NEGATIVE

## 2019-06-04 MED ORDER — OXYCODONE-ACETAMINOPHEN 5-325 MG PO TABS
1.0000 | ORAL_TABLET | Freq: Once | ORAL | Status: AC
  Administered 2019-06-04: 1 via ORAL
  Filled 2019-06-04: qty 1

## 2019-06-04 MED ORDER — NAPROXEN 500 MG PO TABS: 500.0000 mg | ORAL_TABLET | Freq: Two times a day (BID) | ORAL | 0 refills | Status: DC

## 2019-06-04 MED ORDER — METHOCARBAMOL 500 MG PO TABS: 500.0000 mg | ORAL_TABLET | Freq: Three times a day (TID) | ORAL | 0 refills | Status: DC | PRN

## 2019-06-04 MED ORDER — LIDOCAINE 5 % EX PTCH: 1.0000 | MEDICATED_PATCH | CUTANEOUS | 0 refills | Status: DC

## 2019-06-04 NOTE — ED Triage Notes (Signed)
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 Pt reports pains are still really bad.

## 2019-06-04 NOTE — Discharge Instructions (Signed)
You were seen in the emergency department for back pain today.  Your xray was normal. Your pregnancy test was negative.  At this time we suspect that your pain is related to a muscle strain/spasm.   I have prescribed you an anti-inflammatory medication and a muscle relaxer.  - Naproxen is a nonsteroidal anti-inflammatory medication that will help with pain and swelling. Be sure to take this medication as prescribed with food, 1 pill every 12 hours,  It should be taken with food, as it can cause stomach upset, and more seriously, stomach bleeding. Do not take other nonsteroidal anti-inflammatory medications with this such as Advil, Motrin, Aleve, Mobic, Goodie Powder, or Motrin.    - Robaxin is the muscle relaxer I have prescribed, this is meant to help with muscle tightness. Be aware that this medication may make you drowsy therefore the first time you take this it should be at a time you are in an environment where you can rest. Do not drive or operate heavy machinery when taking this medication. Do not drink alcohol or take other sedating medications with this medicine such as narcotics or benzodiazepines.   - Lidoderm patch apply 1 patch to lower back daily to help soothe the area.   You make take Tylenol per over the counter dosing with these medications.   We have prescribed you new medication(s) today. Discuss the medications prescribed today with your pharmacist as they can have adverse effects and interactions with your other medicines including over the counter and prescribed medications. Seek medical evaluation if you start to experience new or abnormal symptoms after taking one of these medicines, seek care immediately if you start to experience difficulty breathing, feeling of your throat closing, facial swelling, or rash as these could be indications of a more serious allergic reaction  The application of heat can help soothe the pain.  Maintaining your daily activities, including  walking, is encourged, as it will help you get better faster than just staying in bed.  Your pain should get better over the next 2 weeks.  You will need to follow up with  Your primary healthcare provider in 1-2 weeks for reassessment, if you do not have a primary care provider one is provided in your discharge instructions- you may see the [facility] or call the provided phone number. However return to the ER should you develop ne or worsening symptoms or any other concerns including but not limited to severe or worsening pain, low back pain with fever, numbness, weakness, loss of bowel or bladder control, or inability to walk or urinate, you should return to the ER immediately.

## 2019-06-04 NOTE — ED Notes (Signed)
Pt transported to Xray. 

## 2019-06-04 NOTE — ED Provider Notes (Signed)
[facility] DEPT Provider Note   CSN: [id number] Arrival date & time: 06/04/19  [id number]     History Chief Complaint  Patient presents with  . Back Pain  . Leg Pain    Candice [name] is a 33 y.o. female with history of depression, tobacco abuse, and migraines who presents to the emergency department with complaints of lower back pain that began yesterday.  Patient states that she was lifting a heavy box of chicken with quick onset of lower back pain.  She states the discomfort is to the diffuse lower back and radiates some into the right gluteal/thigh region.  She states the discomfort is constant, 10 out of 10, worse with attempts of movement, mildly alleviated with some leftover Flexeril she took last night. Denies numbness, tingling, weakness, saddle anesthesia, incontinence to bowel/bladder, fever, chills, IV drug use, dysuria, or hx of cancer. Patient has not had prior back surgeries.   HPI     Past Medical History:  Diagnosis Date  . Complication of anesthesia    epidural with last pregnancy made entire left side numb, had to d/c  . Depression   . Headache   . HSV 06/28/2007  . Hx of migraines     Patient Active Problem List   Diagnosis Date Noted  . Traumatic ecchymosis of right female breast 11/10/2018  . Hemorrhagic cyst of left ovary 10/14/2016  . MRSA colonization 09/25/2014  . Major depressive disorder, recurrent episode, moderate ([place]) 06/08/2013  . Genital herpes 02/16/2013  . TOBACCO USER 03/31/2009  . OBESITY, NOS 08/20/2006    Past Surgical History:  Procedure Laterality Date  . BREAST SURGERY     reduction  . CESAREAN SECTION    . CESAREAN SECTION  09/07/2011   Procedure: CESAREAN SECTION;  Surgeon: Florian Buff, MD;  Location: [facility];  Service: Gynecology;  Laterality: N/A;  Repeat cesarean section with delivery of baby boy at 21. Apgars 9/9.  Bilateral tubal ligation.  . IUD REMOVAL  2005   in cervix  . TUBAL  LIGATION    . tubal reanastomosis  08/13/2016     OB History    Gravida  2   Para  2   Term  2   Preterm  0   AB  0   Living  2     SAB  0   TAB  0   Ectopic  0   Multiple  0   Live Births  2           Family History  Problem Relation Age of Onset  . Alcohol abuse Mother   . Drug abuse Mother   . Depression Mother   . Alcohol abuse Father   . Drug abuse Father   . Mental illness Sister        bipolar ptsd  . Schizophrenia Sister   . Diabetes Maternal Grandmother   . Hypertension Maternal Grandmother   . Cancer Maternal Grandmother        ovarian, breast and lung  . Depression Maternal Grandmother     Social History   Tobacco Use  . Smoking status: Current Every Day Smoker    Packs/day: 0.25    Types: Cigarettes  . Smokeless tobacco: Never Used  Substance Use Topics  . Alcohol use: Yes    Comment: occasional  . Drug use: Yes    Frequency: 4.0 times per week    Types: Marijuana    Home Medications Prior to  Admission medications   Medication Sig Start Date End Date Taking? Authorizing Provider  calcium carbonate (TUMS - DOSED IN MG ELEMENTAL CALCIUM) 500 MG chewable tablet Chew 2 tablets by mouth daily.    [provider]  cyclobenzaprine (FLEXERIL) 10 MG tablet TAKE 1 TABLET BY MOUTH TWICE A DAY AS NEEDED FOR MUSCLE SPASMS 11/05/18   Alveda Reasons, MD  ibuprofen (ADVIL,MOTRIN) 200 MG tablet Take 400 mg by mouth daily as needed for moderate pain.    [provider]  Menthol, Topical Analgesic, (BIOFREEZE EX) Apply 1 application topically daily as needed (arthritis).    [provider]  Multiple Vitamins-Minerals (AIRBORNE PO) Take 1 tablet by mouth daily.    [provider]  omeprazole (PRILOSEC) 20 MG capsule Take 1 capsule (20 mg total) by mouth daily. 08/11/18   Domenic Moras, PA-C    Allergies    Patient has no known allergies.  Review of Systems   Review of Systems  Constitutional: Negative for  chills and fever.  Respiratory: Negative for shortness of breath.   Cardiovascular: Negative for chest pain.  Gastrointestinal: Negative for abdominal pain and vomiting.  Genitourinary: Negative for dysuria.  Musculoskeletal: Positive for back pain.  Neurological: Negative for weakness and numbness.       Negative for incontinence or saddle anesthesia.   Physical Exam Updated Vital Signs BP 113/73 (BP Location: Right Arm)   Pulse 96   Temp 98.3 F (36.8 C) (Oral)   Resp 17   LMP 05/22/2019   SpO2 100%   Physical Exam Constitutional:      General: She is not in acute distress.    Appearance: She is well-developed. She is not toxic-appearing.  HENT:     Head: Normocephalic and atraumatic.  Musculoskeletal:     Cervical back: Normal range of motion and neck supple. No spinous process tenderness or muscular tenderness.     Comments: No obvious deformity, appreciable swelling, erythema, ecchymosis, significant open wounds, or increased warmth.  Extremities: ROM intact. No bony tenderness.  Back: Patient diffusely tender throughout the lumbar region including midline and bilateral paraspinal muscles which extends to the right gluteal area.  Right lumbar paraspinal muscle tenderness greater than left.  No point/focal vertebral tenderness, no palpable step off or crepitus.  Skin:    General: Skin is warm and dry.     Findings: No rash.  Neurological:     Mental Status: She is alert.     Deep Tendon Reflexes:     Reflex Scores:      Patellar reflexes are 2+ on the right side and 2+ on the left side.    Comments: Sensation grossly intact to bilateral lower extremities. 5/5 symmetric strength with plantar/dorsiflexion bilaterally. Gait is intact without obvious foot drop.      ED Results / Procedures / Treatments   Labs (all labs ordered are listed, but only abnormal results are displayed) Labs Reviewed  POC URINE PREG, ED    EKG None  Radiology DG Lumbar Spine  Complete  Result Date: 06/04/2019 CLINICAL DATA:  33 year old female with a history of lower back pain after injury EXAM: LUMBAR SPINE - COMPLETE 4+ VIEW COMPARISON:  None. FINDINGS: Lumbar Spine: Lumbar vertebral elements maintain normal alignment without evidence of anterolisthesis, retrolisthesis, subluxation. No acute fracture line identified. Vertebral body heights maintained. Disc space heights maintained with no significant degenerative disc disease or endplate changes. No significant facet changes. Oblique images demonstrate no displaced pars defect. IMPRESSION: Negative for acute  fracture or malalignment of the lumbar spine Electronically Signed   By: Corrie Mckusick D.O.   On: 06/04/2019 11:31    Procedures Procedures (including critical care time)  Medications Ordered in ED Medications  oxyCODONE-acetaminophen (PERCOCET/ROXICET) 5-325 MG per tablet 1 tablet (has no administration in time range)    ED Course/MDM  I have reviewed the triage vital signs and the nursing notes.  Pertinent labs & imaging results that were available during my care of the patient were reviewed by me and considered in my medical decision making (see chart for details).    Patient presents with complaint of back pain after heavy lifiting  Patient is nontoxic appearing, vitals are WNL. Patient has normal neurologic exam, no point/focal midline tenderness to palpation, she is diffusely tender throughout lumbar region, xray negative. She is ambulatory in the ED.  No back pain red flags. No urinary sxs. Most likely muscle strain versus spasm. Considered UTI/pyelonephritis, kidney stone, aortic aneurysm/dissection, cauda equina or epidural abscess however these do not fit clinical picture at this time. Will treat with Lidoderm patches, Naproxen and Robaxin, discussed with patient that they are not to drive or operate heavy machinery while taking Robaxin. I discussed treatment plan, need for PCP follow-up, and return  precautions with the patient. Provided opportunity for questions, patient confirmed understanding and is in agreement with plan.   Final Clinical Impression(s) / ED Diagnoses Final diagnoses:  Acute bilateral low back pain without sciatica    Rx / DC Orders ED Discharge Orders         Ordered    naproxen (NAPROSYN) 500 MG tablet  2 times daily     06/04/19 1146    methocarbamol (ROBAXIN) 500 MG tablet  Every 8 hours PRN     06/04/19 1146    lidocaine (LIDODERM) 5 %  Every 24 hours     06/04/19 1146           Wilba Mutz, [place], PA-C 06/04/19 1148    Sherwood Gambler, MD 06/04/19 1212

## 2019-06-08 ENCOUNTER — Other Ambulatory Visit: Payer: Self-pay | Admitting: Student in an Organized Health Care Education/Training Program

## 2019-06-08 DIAGNOSIS — A6004 Herpesviral vulvovaginitis: Secondary | ICD-10-CM

## 2019-06-08 MED ORDER — VALACYCLOVIR HCL 500 MG PO TABS: 500.0000 mg | ORAL_TABLET | Freq: Two times a day (BID) | ORAL | 1 refills | Status: AC

## 2019-08-24 ENCOUNTER — Other Ambulatory Visit: Payer: Self-pay | Admitting: Student in an Organized Health Care Education/Training Program

## 2019-09-05 ENCOUNTER — Encounter: Payer: Self-pay | Admitting: Student in an Organized Health Care Education/Training Program

## 2019-09-05 ENCOUNTER — Other Ambulatory Visit (HOSPITAL_COMMUNITY)
Admission: RE | Admit: 2019-09-05 | Discharge: 2019-09-05 | Disposition: A | Discharge status: Home / Self Care | Payer: BC Managed Care – PPO | Source: Ambulatory Visit | Attending: Family Medicine | Admitting: Family Medicine

## 2019-09-05 ENCOUNTER — Other Ambulatory Visit: Payer: Self-pay

## 2019-09-05 ENCOUNTER — Ambulatory Visit (INDEPENDENT_AMBULATORY_CARE_PROVIDER_SITE_OTHER): Payer: BC Managed Care – PPO | Admitting: Student in an Organized Health Care Education/Training Program

## 2019-09-05 VITALS — BP 124/80 | HR 78 | Wt 190.4 lb

## 2019-09-05 DIAGNOSIS — Z113 Encounter for screening for infections with a predominantly sexual mode of transmission: Secondary | ICD-10-CM | POA: Insufficient documentation

## 2019-09-05 DIAGNOSIS — G8929 Other chronic pain: Secondary | ICD-10-CM

## 2019-09-05 DIAGNOSIS — M546 Pain in thoracic spine: Secondary | ICD-10-CM | POA: Diagnosis not present

## 2019-09-05 LAB — POCT WET PREP (WET MOUNT)
Clue Cells Wet Prep Whiff POC: POSITIVE
Trichomonas Wet Prep HPF POC: ABSENT

## 2019-09-05 MED ORDER — CYCLOBENZAPRINE HCL 10 MG PO TABS: ORAL_TABLET | ORAL | 0 refills | Status: DC

## 2019-09-05 NOTE — Progress Notes (Signed)
    SUBJECTIVE:   CHIEF COMPLAINT / HPI: STD screening and back pain  STD screening-patient states that she has had multiple new partners over the past year without any concern for current infections but would like to get a clean bill of health in case she has future encounters.  She is not currently sexually active.  Denies any vaginal discharge, odor, dysuria, rashes or joint pain or fever.  She has not had any herpes outbreaks since her last appointment.  Back pain-patient states that since breast reduction surgery she has had some improvement in her back pain but still consistently has muscle tenderness and spasms which are worsened by extraneous movements and her work.  She has used Flexeril in the past which has helped tremendously.  PERTINENT  PMH / PSH: History of genital herpes and trichomonas  OBJECTIVE:   BP 124/80   Pulse 78   Wt 190 lb 6.4 oz (86.4 kg)   SpO2 99%   BMI 33.73 kg/m    General: NAD, pleasant, able to participate in exam Extremities: no edema or cyanosis. WWP. Pelvic: External genitalia negative for active lesions.  Vaginal canal positive for mucoid white discharge and no bleeding. Skin: warm and dry, no rashes noted Neuro: alert and oriented x4, no focal deficits Psych: Normal affect and mood  ASSESSMENT/PLAN:   Screening examination for STD (sexually transmitted disease) Performed wet prep with gonorrhea chlamydia and pelvic exam. Obtained HIV and RPR labs. We will follow-up with patient on positive HIV results ASAP. ID has already reached out with intentions to treat patient.  Back pain Prescribed Flexeril Referred to occupational therapy     Richarda Osmond, [place]

## 2019-09-05 NOTE — Patient Instructions (Signed)
It was a pleasure to see you today!  To summarize our discussion for this visit:  I will contact you with your test results later this week.  I will refill your flexeril and refer you to OT (occupational therapy) for your back. Please let me know if you do not hear from them by a couple of weeks.   Some additional health maintenance measures we should update are: Health Maintenance Due  Topic Date Due  . INFLUENZA VACCINE  01/22/2019  .   Please return to our clinic to see me as needed.  Call the clinic at [phone] if your symptoms worsen or you have any concerns.   Thank you for allowing me to take part in your care,  Dr. Doristine Mango

## 2019-09-06 ENCOUNTER — Telehealth: Payer: Self-pay | Admitting: *Deleted

## 2019-09-06 ENCOUNTER — Encounter: Payer: Self-pay | Admitting: Student in an Organized Health Care Education/Training Program

## 2019-09-06 DIAGNOSIS — Z113 Encounter for screening for infections with a predominantly sexual mode of transmission: Secondary | ICD-10-CM | POA: Insufficient documentation

## 2019-09-06 LAB — HIV 1/2 AB DIFFERENTIATION
HIV 1 Ab: POSITIVE — AB
HIV 2 Ab: NEGATIVE
NOTE (HIV CONF MULTIP: POSITIVE — AB

## 2019-09-06 LAB — RPR: RPR Ser Ql: NONREACTIVE

## 2019-09-06 LAB — HIV ANTIBODY (ROUTINE TESTING W REFLEX): HIV Screen 4th Generation wRfx: REACTIVE — AB

## 2019-09-06 NOTE — Telephone Encounter (Signed)
Sent referral to [facility] per Dr Johnnye Sima. Landis Gandy, RN

## 2019-09-06 NOTE — Assessment & Plan Note (Signed)
Prescribed Flexeril Referred to occupational therapy

## 2019-09-06 NOTE — Assessment & Plan Note (Signed)
Performed wet prep with gonorrhea chlamydia and pelvic exam. Obtained HIV and RPR labs. We will follow-up with patient on positive HIV results ASAP. ID has already reached out with intentions to treat patient.

## 2019-09-06 NOTE — Progress Notes (Signed)
    SUBJECTIVE:   CHIEF COMPLAINT / HPI: lab result notification and follow up set up  Routine STD screening in clinic Monday for asymptomatic patient revealed positive HIV results.  Schedule patient in clinic today to deliver the news in person.  Patient was obviously and understandably very shocked to learn this news.  Endorses having fevers throughout the last year but had associated these with her herpes lesions.  She has had approximately 5 partners in the last several months and states that she has kept track of who they were since she is not on birth control and had her tubes reversed in 2019. Patient endorses need to be strong for her family members despite this news but is worried that she will be alone the rest of her life now.  We discussed implications of this infection and the advances of prophylactic therapy so that this infection does not mean she will not have the possibility of an intimate relationship again in the future. Unfortunately, patient had brought her 36 year old son with her for this visit who was waiting in separate room throughout this discussion.  Patient attempted to call her sister to come be with her for our discussion but was not able to get a hold of her. Patient feels that she will need some help with grief/depression at this time.  She has been on Zoloft in the past and had good results with that so we will restart at this visit.  Patient also very interested in receiving counseling.  I asked her if she would like me to initiate contact with our counselor today so that they could meet and she was very agreeable to this option.  Dr. Johney Maine was able to come to the room and speak with her.  ID has already contacted me and is aware of her diagnosis and will contact her to set up a follow-up appointment for further management.  I will obtain further labs today to help with further treatment.   PERTINENT  PMH / PSH: Her last HIV screen was 09/2017 and was  non-reactive.  OBJECTIVE:   BP 110/76   Pulse 90   Ht 5\' 3"  (1.6 m)   Wt 190 lb 3.2 oz (86.3 kg)   SpO2 97%   BMI 33.69 kg/m    General: pleasant, able to participate in conversation Neuro: alert and oriented x4, no focal deficits Psych: Appropriate affect and mood given the news that she received.  Tearful. Further assessment per our [facility] specialist.   ASSESSMENT/PLAN:   HIV (human immunodeficiency virus infection) ([place]) -Obtaining HIV quant, hepatitis, CD4 count, urine pregnancy -Referred to ID -Referred to counseling -Given prescription for Zoloft -We will follow-up in 3 to 4 weeks, with ID appointment in between as well as counseling. -Patient has MyChart access and we were able to communicate that way in regard to the appointment so encouraged her to contact me if she has any pressing questions or concerns prior to your next appointment. - depression/suicide prevention resources given at appointment     Richarda Osmond, [place]

## 2019-09-07 ENCOUNTER — Encounter: Payer: Self-pay | Admitting: Student in an Organized Health Care Education/Training Program

## 2019-09-07 ENCOUNTER — Other Ambulatory Visit: Payer: Self-pay

## 2019-09-07 ENCOUNTER — Telehealth (HOSPITAL_COMMUNITY): Payer: Self-pay

## 2019-09-07 ENCOUNTER — Ambulatory Visit (INDEPENDENT_AMBULATORY_CARE_PROVIDER_SITE_OTHER): Payer: BC Managed Care – PPO | Admitting: Student in an Organized Health Care Education/Training Program

## 2019-09-07 VITALS — BP 110/76 | HR 90 | Ht 63.0 in | Wt 190.2 lb

## 2019-09-07 DIAGNOSIS — Z114 Encounter for screening for human immunodeficiency virus [HIV]: Secondary | ICD-10-CM

## 2019-09-07 DIAGNOSIS — B20 Human immunodeficiency virus [HIV] disease: Secondary | ICD-10-CM | POA: Insufficient documentation

## 2019-09-07 LAB — CERVICOVAGINAL ANCILLARY ONLY
Chlamydia: NEGATIVE
Comment: NEGATIVE
Comment: NORMAL
Neisseria Gonorrhea: NEGATIVE

## 2019-09-07 MED ORDER — SERTRALINE HCL 50 MG PO TABS: 50.0000 mg | ORAL_TABLET | Freq: Every day | ORAL | 0 refills | Status: DC

## 2019-09-07 NOTE — Telephone Encounter (Signed)
-----   Message from Richarda Osmond, DO sent at 09/06/2019  6:13 PM EDT ----- Regarding: appointment notification Hey team,  I have some very serious news to deliver to this patient and want to do it in person so I am trying to get her into an appointment with me ASAP.  She's scheduled for 1:30 on 3/17 and I have tried to call her cell and home numbers multiple times without any luck and voicemails are not set up or full. If you get a chance before her appointment time, please try to get ahold of her and document if you do hear from her so I can meet with her. Thank you so much for your help. Chels

## 2019-09-07 NOTE — Patient Instructions (Signed)
It was a pleasure to see you today!  To summarize our discussion for this visit:  We went over some very serious news. I'm printing some more information here but you can always message me if you need anything. The ID doctor office will also be contacting you to set up an appointment and will let you know the results of the new tests.  I am starting a new medication for you which you've taken before. (Zoloft) if you begin experiencing any suicidal thoughts, please reach out to the resources below.  Please return to our clinic to see me in 3-4 weeks Call the clinic at [phone] if your symptoms worsen or you have any concerns.   Thank you for allowing me to take part in your care,  Dr. Doristine Mango  If you are feeling suicidal or depression symptoms worsen please immediately go to:   24 Hour Availability [facility]  [address], [place], [place] [address]  [phone] or [phone]  . If you are thinking about harming yourself or having thoughts of suicide, or if you know someone who is, seek help right away. . Call your doctor or mental health care provider. . Call 911 or go to a hospital emergency room to get immediate help, or ask a friend or family member to help you do these things. . Call the [place] National Suicide Prevention Lifeline's toll-free, 24-hour hotline at 1-800-273-TALK 604 654 4966) or TTY: 1-800-799-4 TTY 907-720-6955) to talk to a trained counselor. . If you are in crisis, make sure you are not left alone.  . If someone else is in crisis, make sure he or she is not left alone   [facility] [employer] (Domestic Violence, Rape & Victim Assistance [phone]  [facility]  [address], [place]  [address]               [phone] or [phone]  [place]    (ONLY from 8am-4pm)    [phone]  [facility]  (24/7)   [phone]  [place] National Suicide Hotline   250-364-2355 Diamantina Monks)   HIV Infection and AIDS HIV (human immunodeficiency virus) infection is a permanent (chronic) viral infection. HIV kills white blood cells that are called CD4 cells. These cells help to control the body's defense system (immune system) and fight infection. If a person does not have enough CD4 cells, he or she can develop infections, cancers, and other health problems. What are the causes? This virus is passed from person to person:  Through sex.  Through contact with infected blood.  During childbirth or breastfeeding. What increases the risk? This condition is more likely to develop in people who:  Have unprotected sex.  Share needles or other drug equipment. What are the signs or symptoms? Symptoms of this condition usually develop in phases: Asymptomatic phase You may not feel sick, or you may only feel sick some of the time. Symptoms may include:  Low-grade fever.  Headaches.  Sore throat.  Rash.  Fatigue.  Nausea, vomiting, or diarrhea.  Night sweats. Early symptomatic phase You may notice:  Your early symptoms getting worse or happening more often.  Oral, vaginal, or rectal sores that are caused by infections.  Problems that are related to inflammation, such as joint pain. Symptomatic phase: AIDS, or acquired immunodeficiency syndrome Your immune system no longer protects you from infections and other health problems. You may get infections  that you would not normally get if your immune system was healthy and working properly (opportunistic diseases). Problems that are caused by opportunistic diseases include:  Coughing.  Trouble breathing.  Diarrhea.  Skin sores.  Trouble swallowing.  High fevers.  Blurred vision.  Stiff neck.  Mental confusion. You may also begin to notice:  Weight loss.  Tingling or pain in your hands and feet.  Mouth sores or tooth  pain.  Severe fatigue. How is this diagnosed? This condition is diagnosed with:  A screening test to check blood for a chemical (antibody) that is produced only when the body is fighting HIV.  A blood test to confirm the presence of HIV. How is this treated? There is no cure for this condition, but treatment can help to keep HIV from getting worse.  You will be given medicines that may slow down the rate at which HIV multiplies in your body (antiretroviral therapy, or ART). ART may: ? Keep your immune system as healthy as possible and help it work better. ? Decrease the amount of HIV in your body. ? Reduce the risk of problems caused by HIV. ? Prolong your life. ? Improve the quality of your life. ? Help prevent passing HIV to someone.  You will need to have routine lab tests performed to monitor your treatment and immune system. Follow these instructions at home: Medicines  Take over-the-counter and prescription medicines only as told by your health care provider.  If you were prescribed an antibiotic medicine, take it as told by your health care provider. Do not stop taking the antibiotic even if you start to feel better. Lifestyle   Do not use any products that contain alcohol, nicotine, tobacco, or recreational drugs. These can cause further damage to your immune system. They can also cause problems with your liver, lungs, and heart. If you need help quitting, ask your health care provider.  Do not share needles or other equipment used for injecting, smoking, or snorting drugs.  Protect yourself from other STIs (sexually transmitted infections) by using condoms when you have sex. This includes vaginal, oral, and anal sex.  Eat in a healthy way, get enough sleep, and exercise. General instructions   Tell your sexual partners that you have HIV. Encourage them to get tested.  Keep your vaccinations up to date. Make sure that you get all recommended vaccines, including  vaccines for hepatitis A, hepatitis B, measles, and influenza.  See your dentist regularly. Brush and floss your teeth every day.  See a counselor or a Education officer, museum to help you solve problems and find any services that you need.  Get support from your family and friends.  Keep all follow-up visits as told by your health care provider. This is important. You will need to have routine blood tests every 3-6 months to monitor your health. How is this prevented? To prevent the spread of HIV:  Talk with your health care provider about protecting your sexual partners from HIV. Your health care provider may encourage your partner to take medicines to decrease the risk of getting HIV (pre-exposure prophylaxis, or PrEP).  Use a condom every time you have sex. This includes vaginal, oral, and anal sex. ? The condom should be in place from the beginning of the sexual activity to the end. ? Use only latex or polyurethane condoms and water-based lubricants. ? Wearing a condom reduces your risk of spreading HIV.  Avoid alcohol and recreational drugs that affect your judgment. They may  make you forget to use a condom or may increase your chances of participating in high-risk sex.  Do not share equipment that is used to take drugs, such as needles, syringes, cookers, tourniquets, pipes, or straws. If you share equipment, clean it before and after you use it. Contact a health care provider if you have:  Lost a lot of weight.  Extreme fatigue.  Trouble swallowing.  Vomiting or diarrhea that does not get better.  Muscle pain or joint pain.  Any problems that are related to your medicines. Get help right away if you have:  A rash that causes your skin to peel.  Blisters inside your mouth.  Pain in your abdomen.  Eye redness or swelling around your eyes.  A high fever and chills.  Shortness of breath.  A cough that is dry (nonproductive) or wet (productive).  Vision problems, such as  blind spots, flashing lights, or decreased or blurred vision.  A persistent headache, confusion, or changes in the way that you think, feel, or behave (altered mental status). Summary  HIV kills white blood cells called CD4 cells that help to control the body's defense system (immune system) and fight infection.  Symptoms of this condition usually develop in phases. In the asymptomatic phase, you may not feel sick, or you may only feel sick some of the time.  Treatment will include medicines to slow down the rate at which HIV multiplies in your body (antiretroviral therapy, or ART). This information is not intended to replace advice given to you by your health care provider. Make sure you discuss any questions you have with your health care provider. Document Revised: 03/25/2018 Document Reviewed: 03/25/2018 Elsevier Patient Education  2020 [employer].   How to Care for Yourself When You Have HIV Taking good care of yourself is very important when you are living with HIV (human immunodeficiency virus). There are many things that you can do to stay as healthy as possible and prevent HIV from progressing to AIDS (acquired immunodeficiency syndrome). Follow these instructions at home: Medicines, herbs, and supplements  Make sure you understand your medicine plan.  Take over-the-counter and prescription medicines only as told by your health care provider. Take your medicines the right way, every day, regardless of how long you have had the virus.  Do not take any medicines, herbal remedies, or dietary supplements without your health care provider's knowledge and approval. Diet and exercise  Eat a healthy diet that includes fruits and vegetables, whole grains, and lean proteins.  If you lose weight without trying, ask your health care provider whether you should take a protein or calorie supplement daily.  Drink enough fluid to keep your urine pale yellow.  Take steps to lower your  risk of getting an illness that spreads through germs in food (foodborne illness): ? Do not eat or drink unpasteurized dairy products. ? Do not drink fruit juices. ? Do not eat raw seafood. ? Always cook eggs thoroughly. ? Make sure your meat is cooked until you cannot see any pink. ? Do not drink water that may contain human or animal waste, such as from a lake or river. Do not swallow water while swimming.  Follow your health care provider's recommendations for participating in healthy exercise. Sexual activity      Avoid risky sexual behaviors and limit the number of sexual partners you have.  Use condoms correctly every time you engage in sexual activity that may expose you or your partner to bodily  fluids.  Talk to your health care provider about other ways to protect your sexual partners. Health care visits  Get all recommended lab tests.  Keep your vaccinations up to date. Get the flu vaccine every year.  If you wish to become pregnant, talk with your health care provider.  Ask to be checked for STIs (sexually transmitted infections).  Keep all follow-up visits as told by your health care provider. This is important. General instructions  Understand what germs you may be exposed to at work, at home, and in the community. Try to limit your exposure to these germs.  If you have a pet, such as a cat or bird, ask a friend or family member to change your cat's litter box or clean your birdcage. Waste from certain pets may be potentially harmful to you.  Try to avoid contact with people who may have an illness that spreads easily from person to person (is contagious).  Do not share drug injection equipment.  Consider joining a support group for people living with HIV. Contact a health care provider if:  You develop a cough.  You are short of breath.  You have a fever.  You have diarrhea.  You have nausea or vomiting.  You are bruising or bleeding easily.  You  have a new skin growth or an existing mole that changes in size, color, or shape.  You find out that you are pregnant.  You have a headache that does not get better with medicine.  You are losing weight without trying.  You feel weak.  You have flu-like symptoms, such as: ? Achiness. ? Weakness. ? Increased tiredness (fatigue). Get help right away if:  There is blood in your stool or vomit.  You cough up blood.  You have chest pain.  You have abdominal pain.  You have shaking chills.  You have a severe headache.  You have trouble thinking clearly or you feel confused.  You have jerking movements or stiffening of your arms and legs that you cannot control (seizure).  You pass out. Summary  Take over-the-counter and prescription medicines only as told by your health care provider. Take your medicines the right way, every day, regardless of how long you have had the virus.  Visit your health care provider regularly.  It is important to make choices that keep you healthy and protect others. This information is not intended to replace advice given to you by your health care provider. Make sure you discuss any questions you have with your health care provider. Document Revised: 09/05/2017 Document Reviewed: 09/19/2016 Elsevier Patient Education  [employer] [place].

## 2019-09-07 NOTE — Assessment & Plan Note (Addendum)
-  Obtaining HIV quant, hepatitis, CD4 count, urine pregnancy -Referred to ID -Referred to counseling -Given prescription for Zoloft -We will follow-up in 3 to 4 weeks, with ID appointment in between as well as counseling. -Patient has MyChart access and we were able to communicate that way in regard to the appointment so encouraged her to contact me if she has any pressing questions or concerns prior to your next appointment. - depression/suicide prevention resources given at appointment

## 2019-09-07 NOTE — Telephone Encounter (Signed)
Candice [name] from [facility] called and wanted verification that pt. Was set up for an appointment for recent lab results.    Dannielle Burn, Public Health Advisor.

## 2019-09-07 NOTE — Progress Notes (Signed)
Dr. Ouida Sills came to me for [facility] consult. I went to assess the patient in person.  S: Pt reported she found out about her HIV diagnosis today and was very upset.  PT reported she feels overwhelmed.  Pt reported a past SI attempt with plans of taking pills or driving off the road.  Pt denied current SI and stated she feels comfortable to call crisis or reach out to help when they (SI thoughts) re-occur.  Pt reported protective factor includes her children.  Pt and Dr. Hartford Poli discussed externalizing her diagnosis as not part of her identity but a diagnosis to treat and overcome.   With patient's consent, pt's sister was brought in to the exam room to talk about safety planning around SI. Pt reported it was important for her sister to support her.  Conversation and treatment planning was done jointly with Dr. Hartford Poli and Dr. Ouida Sills.   O: Pt was tearful and tangential.  A: Pt is experiencing shock due to recent discovery of diagnosis.   P: F/U safety check-in call Friday at [place] number was also received if pt is unreachable.   Pt was given crisis resources.

## 2019-09-08 ENCOUNTER — Encounter: Payer: Self-pay | Admitting: Family

## 2019-09-08 ENCOUNTER — Ambulatory Visit: Payer: BC Managed Care – PPO

## 2019-09-08 ENCOUNTER — Encounter: Payer: Self-pay | Admitting: Student in an Organized Health Care Education/Training Program

## 2019-09-08 ENCOUNTER — Other Ambulatory Visit: Payer: BC Managed Care – PPO

## 2019-09-08 ENCOUNTER — Telehealth: Payer: Self-pay

## 2019-09-08 DIAGNOSIS — Z79899 Other long term (current) drug therapy: Secondary | ICD-10-CM | POA: Diagnosis not present

## 2019-09-08 DIAGNOSIS — B20 Human immunodeficiency virus [HIV] disease: Secondary | ICD-10-CM | POA: Diagnosis not present

## 2019-09-08 DIAGNOSIS — Z113 Encounter for screening for infections with a predominantly sexual mode of transmission: Secondary | ICD-10-CM | POA: Diagnosis not present

## 2019-09-08 LAB — HEPATITIS PANEL, ACUTE
Hep A IgM: NEGATIVE
Hep B C IgM: NEGATIVE
Hep C Virus Ab: <0.1 s/co ratio (ref 0.0–0.9)
Hepatitis B Surface Ag: NEGATIVE

## 2019-09-08 NOTE — Addendum Note (Signed)
Addended by: Londell Moh T on: 09/08/2019 03:29 PM   Modules accepted: Orders

## 2019-09-08 NOTE — Progress Notes (Signed)
Labs ordered were HIV 1 RNA and T-Helper Cells CD4/CD8%. The wrong tube was used to collect the T-helper cells test. We can run either the HIV test or the T-helper cells test but not both. I paged Dr. Nori Riis. Was advised to page Dr. Ouida Sills and get her input but if I do not hear from her cancel the HIV 1 RNA and run the T-helper Cells test.  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 I did not get a response so the decision was made to cancel the HIV 1 RNA test and run the T-Helper Cells test. Ottis Stain, [place]

## 2019-09-08 NOTE — Telephone Encounter (Signed)
Patient is here today for her new B-20 labs.  She was informed on yesterday of HIV positive status.  She has been scheduled for a return visit on 09-22-2019.    I introduced myself and asked if she had any questions, since I noticed her appointment with family practice was detailed. Patient given our number and told to call if she had any questions.    Laverle Patter, RN

## 2019-09-09 ENCOUNTER — Other Ambulatory Visit: Payer: Self-pay | Admitting: Student in an Organized Health Care Education/Training Program

## 2019-09-09 LAB — T-HELPER CELLS CD4/CD8 %
% CD 4 Pos. Lymph.: 38.6 % (ref 30.8–58.5)
Absolute CD 4 Helper: 1119 /uL (ref 359–1519)
Basophils Absolute: 0.1 10*3/uL (ref 0.0–0.2)
Basos: 1 %
CD3+CD4+ Cells/CD3+CD8+ Cells Bld: 1.07 (ref 0.92–3.72)
CD3+CD8+ Cells # Bld: 1047 /uL — ABNORMAL HIGH (ref 109–897)
CD3+CD8+ Cells NFr Bld: 36.1 % — ABNORMAL HIGH (ref 12.0–35.5)
EOS (ABSOLUTE): 0.3 10*3/uL (ref 0.0–0.4)
Eos: 3 %
Hematocrit: 40.6 % (ref 34.0–46.6)
Hemoglobin: 13.2 g/dL (ref 11.1–15.9)
Immature Grans (Abs): 0 10*3/uL (ref 0.0–0.1)
Immature Granulocytes: 0 %
Lymphocytes Absolute: 2.9 10*3/uL (ref 0.7–3.1)
Lymphs: 24 %
MCH: 27.3 pg (ref 26.6–33.0)
MCHC: 32.5 g/dL (ref 31.5–35.7)
MCV: 84 fL (ref 79–97)
Monocytes Absolute: 0.7 10*3/uL (ref 0.1–0.9)
Monocytes: 6 %
Neutrophils Absolute: 8.1 10*3/uL — ABNORMAL HIGH (ref 1.4–7.0)
Neutrophils: 66 %
Platelets: 315 10*3/uL (ref 150–450)
RBC: 4.83 x10E6/uL (ref 3.77–5.28)
RDW: 14.5 % (ref 11.7–15.4)
WBC: 12 10*3/uL — ABNORMAL HIGH (ref 3.4–10.8)

## 2019-09-09 LAB — T-HELPER CELL (CD4) - (RCID CLINIC ONLY)
CD4 % Helper T Cell: 40 % (ref 33–65)
CD4 T Cell Abs: 757 /uL (ref 400–1790)

## 2019-09-09 MED ORDER — METRONIDAZOLE 500 MG PO TABS: 500.0000 mg | ORAL_TABLET | Freq: Three times a day (TID) | ORAL | 0 refills | Status: DC

## 2019-09-12 ENCOUNTER — Telehealth: Payer: Self-pay | Admitting: Psychology

## 2019-09-12 NOTE — Telephone Encounter (Signed)
Spoke to pt for check in.  Pt denied SI.  Pt reported she has been adjusting to her news.  Pt reported she has thoughts about getting a will together due to diagnosis. Pt reported she would call office if she was interested in further behavioral health therapy.

## 2019-09-20 LAB — CBC WITH DIFFERENTIAL/PLATELET
Absolute Monocytes: 655 cells/uL (ref 200–950)
Basophils Absolute: 36 cells/uL (ref 0–200)
Basophils Relative: 0.3 %
Eosinophils Absolute: 286 cells/uL (ref 15–500)
Eosinophils Relative: 2.4 %
HCT: 39 % (ref 35.0–45.0)
Hemoglobin: 12.9 g/dL (ref 11.7–15.5)
Lymphs Abs: 2130 cells/uL (ref 850–3900)
MCH: 28 pg (ref 27.0–33.0)
MCHC: 33.1 g/dL (ref 32.0–36.0)
MCV: 84.6 fL (ref 80.0–100.0)
MPV: 9.3 fL (ref 7.5–12.5)
Monocytes Relative: 5.5 %
Neutro Abs: 8794 cells/uL — ABNORMAL HIGH (ref 1500–7800)
Neutrophils Relative %: 73.9 %
Platelets: 303 10*3/uL (ref 140–400)
RBC: 4.61 10*6/uL (ref 3.80–5.10)
RDW: 14.3 % (ref 11.0–15.0)
Total Lymphocyte: 17.9 %
WBC: 11.9 10*3/uL — ABNORMAL HIGH (ref 3.8–10.8)

## 2019-09-20 LAB — COMPLETE METABOLIC PANEL WITH GFR
AG Ratio: 1.5 (calc) (ref 1.0–2.5)
ALT: 11 U/L (ref 6–29)
AST: 12 U/L (ref 10–30)
Albumin: 4.1 g/dL (ref 3.6–5.1)
Alkaline phosphatase (APISO): 40 U/L (ref 31–125)
BUN: 10 mg/dL (ref 7–25)
CO2: 25 mmol/L (ref 20–32)
Calcium: 9.2 mg/dL (ref 8.6–10.2)
Chloride: 108 mmol/L (ref 98–110)
Creat: 0.63 mg/dL (ref 0.50–1.10)
GFR, Est African American: 135 mL/min/{1.73_m2} (ref >=60)
GFR, Est Non African American: 116 mL/min/{1.73_m2} (ref >=60)
Globulin: 2.7 g/dL (calc) (ref 1.9–3.7)
Glucose, Bld: 101 mg/dL — ABNORMAL HIGH (ref 65–99)
Potassium: 3.9 mmol/L (ref 3.5–5.3)
Sodium: 137 mmol/L (ref 135–146)
Total Bilirubin: 0.5 mg/dL (ref 0.2–1.2)
Total Protein: 6.8 g/dL (ref 6.1–8.1)

## 2019-09-20 LAB — QUANTIFERON-TB GOLD PLUS
Mitogen-NIL: >10 IU/mL
NIL: 0.04 IU/mL
QuantiFERON-TB Gold Plus: NEGATIVE
TB1-NIL: 0.01 IU/mL
TB2-NIL: 0.01 IU/mL

## 2019-09-20 LAB — HEPATITIS C ANTIBODY
Hepatitis C Ab: NONREACTIVE
SIGNAL TO CUT-OFF: 0.05 (ref <1.00)

## 2019-09-20 LAB — LIPID PANEL
Cholesterol: 166 mg/dL (ref <200)
HDL: 48 mg/dL — ABNORMAL LOW (ref >=50)
LDL Cholesterol (Calc): 102 mg/dL (calc) — ABNORMAL HIGH
Non-HDL Cholesterol (Calc): 118 mg/dL (calc) (ref <130)
Total CHOL/HDL Ratio: 3.5 (calc) (ref <5.0)
Triglycerides: 70 mg/dL (ref <150)

## 2019-09-20 LAB — HIV-1/2 AB - DIFFERENTIATION
HIV-1 antibody: POSITIVE — AB
HIV-2 Ab: NEGATIVE

## 2019-09-20 LAB — HIV-1 RNA ULTRAQUANT REFLEX TO GENTYP+
HIV 1 RNA Quant: 421 copies/mL — ABNORMAL HIGH
HIV-1 RNA Quant, Log: 2.62 Log copies/mL — ABNORMAL HIGH

## 2019-09-20 LAB — HIV-1 GENOTYPE: HIV-1 Genotype: DETECTED — AB

## 2019-09-20 LAB — HEPATITIS A ANTIBODY, TOTAL: Hepatitis A AB,Total: BORDERLINE — AB

## 2019-09-20 LAB — RPR: RPR Ser Ql: NONREACTIVE

## 2019-09-20 LAB — HLA B*5701: HLA-B*5701 w/rflx HLA-B High: NEGATIVE

## 2019-09-20 LAB — HEPATITIS B SURFACE ANTIBODY,QUALITATIVE: Hep B S Ab: REACTIVE — AB

## 2019-09-20 LAB — HEPATITIS B CORE ANTIBODY, TOTAL: Hep B Core Total Ab: NONREACTIVE

## 2019-09-20 LAB — HIV ANTIBODY (ROUTINE TESTING W REFLEX): HIV 1&2 Ab, 4th Generation: REACTIVE — AB

## 2019-09-20 LAB — HEPATITIS B SURFACE ANTIGEN: Hepatitis B Surface Ag: NONREACTIVE

## 2019-09-21 ENCOUNTER — Telehealth: Payer: Self-pay

## 2019-09-21 NOTE — Telephone Encounter (Signed)
COVID-19 Pre-Screening Questions:09/21/19  Do you currently have a fever (>100 F), chills or unexplained body aches?NO  Are you currently experiencing new cough, shortness of breath, sore throat, runny nose?NO .  Have you recently travelled outside the state of [place] in the last 14 days? NO  .  Have you been in contact with someone that is currently pending confirmation of Covid19 testing or has been confirmed to have the [place] virus?  NO  **If the patient answers NO to ALL questions -  advise the patient to please call the clinic before coming to the office should any symptoms develop.

## 2019-09-22 ENCOUNTER — Ambulatory Visit (INDEPENDENT_AMBULATORY_CARE_PROVIDER_SITE_OTHER): Payer: BC Managed Care – PPO | Admitting: Family

## 2019-09-22 ENCOUNTER — Encounter: Payer: Self-pay | Admitting: Family

## 2019-09-22 ENCOUNTER — Other Ambulatory Visit: Payer: Self-pay

## 2019-09-22 ENCOUNTER — Ambulatory Visit (INDEPENDENT_AMBULATORY_CARE_PROVIDER_SITE_OTHER): Payer: BC Managed Care – PPO | Admitting: Pharmacist

## 2019-09-22 VITALS — BP 137/87 | HR 90 | Temp 98.2°F | Ht 62.5 in | Wt 187.0 lb

## 2019-09-22 DIAGNOSIS — F331 Major depressive disorder, recurrent, moderate: Secondary | ICD-10-CM | POA: Diagnosis not present

## 2019-09-22 DIAGNOSIS — Z21 Asymptomatic human immunodeficiency virus [HIV] infection status: Secondary | ICD-10-CM | POA: Diagnosis not present

## 2019-09-22 DIAGNOSIS — F17200 Nicotine dependence, unspecified, uncomplicated: Secondary | ICD-10-CM | POA: Diagnosis not present

## 2019-09-22 DIAGNOSIS — B20 Human immunodeficiency virus [HIV] disease: Secondary | ICD-10-CM | POA: Diagnosis not present

## 2019-09-22 MED ORDER — BICTEGRAVIR-EMTRICITAB-TENOFOV 50-200-25 MG PO TABS: 1.0000 | ORAL_TABLET | Freq: Every day | ORAL | 2 refills | Status: DC

## 2019-09-22 NOTE — Assessment & Plan Note (Signed)
Ms. Bowery is a 34 year old female with newly diagnosed HIV-1 disease with initial CD4 nadir of 757 with viral load of 121.  Risk factors for acquiring HIV includes heterosexual contact.  Genotype is wild with no significant resistance.  May have had acute retroviral syndrome at some point over the last year.  No history of opportunistic infection.  [name] her lab work and discussed the plan of care.  Discussed the pathogenesis, risk of left untreated, transmission, prevention, and treatment options for HIV disease.  Clinic orientation provided.  She met with our pharmacy staff to discuss medication.  Start [place] with co-pay card provided.  Plan for follow-up in 1 month or sooner if needed with lab work on the same day.

## 2019-09-22 NOTE — Assessment & Plan Note (Signed)
Currently smokes approximately 1/2 pack of cigarettes per day on average.  Discussed importance of tobacco cessation to reduce risk of cardiovascular, respiratory, malignant disease in the future.  In the precontemplation stage of quitting.  Continue to monitor.

## 2019-09-22 NOTE — Progress Notes (Signed)
Subjective:    Patient ID: Candice [name], female    DOB: 1986/02/19, 34 y.o.   MRN: [id number]  Chief Complaint  Patient presents with  . HIV Positive/AIDS    HPI:  Candice [name] is a 34 y.o. female with previous medical history of depression, back pain and tobacco use presenting today for initial evaluation and treatment of HIV-1 disease.   Candice [name] was first diagnosed with HIV on 09/05/2019 during routine screening through primary care preventative exam.  Her last negative HIV test was in April 2019.  Risk factors for acquiring HIV includes heterosexual contact.  She has had flulike symptoms approximately 3 times in the last year. Denies fevers, chills, night sweats, headaches, changes in vision, neck pain/stiffness, nausea, diarrhea, vomiting, lesions or rashes.  Candice [name] has informed her sister of her positive status who is supportive.  She is also working with [facility] and looking to become an advocate for HIV care.  Does have history of depression but denies being down, depressed, or hopeless recently.  She drinks alcohol on occasion; smokes marijuana 4 times per week on average; and is a current everyday smoker of tobacco at a rate of 1/4 pack/day.  She is not currently sexually active.  Currently works full-time at [employer] and has primary insurance coverage through [employer].  Candice [name] initial blood work shows confirmation of HIV-1 infection with initial CD4 nadir of 757 with viral load of 121.  Genotype performed with wild-type results and no significant resistance.  Previous STI testing negative along with RPR nonreactive.  Serologically immune to hepatitis B and borderline hepatitis A and no acute infection with hepatitis C.  HLA B5 701 was negative.  Kidney function, liver function, electrolytes within normal ranges.  Lipid profile with LDL of 102, HDL 48, and triglycerides 70.  No Known Allergies    Outpatient Medications Prior to  Visit  Medication Sig Dispense Refill  . calcium carbonate (TUMS - DOSED IN MG ELEMENTAL CALCIUM) 500 MG chewable tablet Chew 2 tablets by mouth daily.    . cyclobenzaprine (FLEXERIL) 10 MG tablet TAKE 1 TABLET BY MOUTH TWICE A DAY AS NEEDED FOR MUSCLE SPASMS 45 tablet 0  . mupirocin ointment (BACTROBAN) 2 % PLACE 1 APPLICATION INTO THE NOSE 2 (TWO) TIMES DAILY. FOR 7 DAYS. 22 g 1  . sertraline (ZOLOFT) 50 MG tablet Take 1 tablet (50 mg total) by mouth daily. 60 tablet 0  . metroNIDAZOLE (FLAGYL) 500 MG tablet Take 1 tablet (500 mg total) by mouth 3 (three) times daily. (Patient not taking: Reported on 09/22/2019) 21 tablet 0   No facility-administered medications prior to visit.     Past Medical History:  Diagnosis Date  . Complication of anesthesia    epidural with last pregnancy made entire left side numb, had to d/c  . Depression   . Headache   . HSV 06/28/2007  . Hx of migraines       Past Surgical History:  Procedure Laterality Date  . BREAST SURGERY     reduction  . CESAREAN SECTION    . CESAREAN SECTION  09/07/2011   Procedure: CESAREAN SECTION;  Surgeon: Florian Buff, MD;  Location: [facility];  Service: Gynecology;  Laterality: N/A;  Repeat cesarean section with delivery of baby boy at 46. Apgars 9/9.  Bilateral tubal ligation.  . IUD REMOVAL  2005   in cervix  . TUBAL LIGATION    . tubal reanastomosis  08/13/2016  Family History  Problem Relation Age of Onset  . Alcohol abuse Mother   . Drug abuse Mother   . Depression Mother   . Alcohol abuse Father   . Drug abuse Father   . Mental illness Sister        bipolar ptsd  . Schizophrenia Sister   . Diabetes Maternal Grandmother   . Hypertension Maternal Grandmother   . Cancer Maternal Grandmother        ovarian, breast and lung  . Depression Maternal Grandmother       Social History   Socioeconomic History  . Marital status: Single    Spouse name: Not on file  . Number of children: 2  . Years of  education: 24  . Highest education level: Not on file  Occupational History  . Not on file  Tobacco Use  . Smoking status: Current Every Day Smoker    Packs/day: 0.25    Types: Cigarettes  . Smokeless tobacco: Never Used  Substance and Sexual Activity  . Alcohol use: Yes    Comment: occasional  . Drug use: Yes    Frequency: 4.0 times per week    Types: Marijuana  . Sexual activity: Not Currently    Birth control/protection: None  Other Topics Concern  . Not on file  Social History Narrative   Works as Glass blower/designer.   FOB of both children lives in [place]; will be some what involved but mother is no longer dating him.   Social Determinants of Health   Financial Resource Strain:   . Difficulty of Paying Living Expenses:   Food Insecurity:   . Worried About Charity fundraiser in the Last Year:   . Arboriculturist in the Last Year:   Transportation Needs:   . Film/video editor (Medical):   Marland Kitchen Lack of Transportation (Non-Medical):   Physical Activity:   . Days of Exercise per Week:   . Minutes of Exercise per Session:   Stress:   . Feeling of Stress :   Social Connections:   . Frequency of Communication with Friends and Family:   . Frequency of Social Gatherings with Friends and Family:   . Attends Religious Services:   . Active Member of Clubs or Organizations:   . Attends Archivist Meetings:   Marland Kitchen Marital Status:   Intimate Partner Violence:   . Fear of Current or Ex-Partner:   . Emotionally Abused:   Marland Kitchen Physically Abused:   . Sexually Abused:      Review of Systems  Constitutional: Negative for appetite change, chills, diaphoresis, fatigue, fever and unexpected weight change.  Eyes:       Negative for acute change in vision  Respiratory: Negative for chest tightness, shortness of breath and wheezing.   Cardiovascular: Negative for chest pain.  Gastrointestinal: Negative for diarrhea, nausea and vomiting.  Genitourinary: Negative for  dysuria, pelvic pain and vaginal discharge.  Musculoskeletal: Negative for neck pain and neck stiffness.  Skin: Negative for rash.  Neurological: Negative for seizures, syncope, weakness and headaches.  Hematological: Negative for adenopathy. Does not bruise/bleed easily.  Psychiatric/Behavioral: Negative for hallucinations.       Objective:    BP 137/87   Pulse 90   Temp 98.2 F (36.8 C)   Ht 5' 2.5" (1.588 m)   Wt 187 lb (84.8 kg)   LMP 09/09/2019   SpO2 100%   BMI 33.66 kg/m  Nursing note and vital signs reviewed.  Physical  Exam Constitutional:      General: She is not in acute distress.    Appearance: She is well-developed.  Eyes:     Conjunctiva/sclera: Conjunctivae normal.  Cardiovascular:     Rate and Rhythm: Normal rate and regular rhythm.     Heart sounds: Normal heart sounds. No murmur. No friction rub. No gallop.   Pulmonary:     Effort: Pulmonary effort is normal. No respiratory distress.     Breath sounds: Normal breath sounds. No wheezing or rales.  Chest:     Chest wall: No tenderness.  Abdominal:     General: Bowel sounds are normal.     Palpations: Abdomen is soft.     Tenderness: There is no abdominal tenderness.  Musculoskeletal:     Cervical back: Neck supple.  Lymphadenopathy:     Cervical: No cervical adenopathy.  Skin:    General: Skin is warm and dry.     Findings: No rash.  Neurological:     Mental Status: She is alert and oriented to person, place, and time.  Psychiatric:        Behavior: Behavior normal.        Thought Content: Thought content normal.        Judgment: Judgment normal.         Assessment & Plan:   Patient Active Problem List   Diagnosis Date Noted  . HIV (human immunodeficiency virus infection) ([place]) 09/07/2019  . Screening examination for STD (sexually transmitted disease) 09/06/2019  . Traumatic ecchymosis of right female breast 11/10/2018  . Hemorrhagic cyst of left ovary 10/14/2016  . MRSA colonization  09/25/2014  . Major depressive disorder, recurrent episode, moderate ([place]) 06/08/2013  . Genital herpes 02/16/2013  . Back pain 10/13/2011  . TOBACCO USER 03/31/2009  . OBESITY, NOS 08/20/2006     Problem List Items Addressed This Visit      Other   TOBACCO USER    Currently smokes approximately 1/2 pack of cigarettes per day on average.  Discussed importance of tobacco cessation to reduce risk of cardiovascular, respiratory, malignant disease in the future.  In the precontemplation stage of quitting.  Continue to monitor.      Major depressive disorder, recurrent episode, moderate ([place])    Ms. Cunning seems adequately controlled with no suicidal ideations or signs of psychosis at present currently maintained on Zoloft with no adverse side effects.  Continue management per primary care.      HIV (human immunodeficiency virus infection) ([place]) - Primary    Ms. Snipe is a 34 year old female with newly diagnosed HIV-1 disease with initial CD4 nadir of 757 with viral load of 121.  Risk factors for acquiring HIV includes heterosexual contact.  Genotype is wild with no significant resistance.  May have had acute retroviral syndrome at some point over the last year.  No history of opportunistic infection.  [name] her lab work and discussed the plan of care.  Discussed the pathogenesis, risk of left untreated, transmission, prevention, and treatment options for HIV disease.  Clinic orientation provided.  She met with our pharmacy staff to discuss medication.  Start [place] with co-pay card provided.  Plan for follow-up in 1 month or sooner if needed with lab work on the same day.      Relevant Medications   bictegravir-emtricitabine-tenofovir AF (BIKTARVY) 50-200-25 MG TABS tablet       I have discontinued Adena L. Cardinal's metroNIDAZOLE. I am also having her start on bictegravir-emtricitabine-tenofovir AF. Additionally, I  am having her maintain her calcium carbonate, mupirocin  ointment, cyclobenzaprine, and sertraline.   Meds ordered this encounter  Medications  . bictegravir-emtricitabine-tenofovir AF (BIKTARVY) 50-200-25 MG TABS tablet    Sig: Take 1 tablet by mouth daily.    Dispense:  30 tablet    Refill:  2    Order Specific Question:   Supervising Provider    Answer:   Carlyle Basques [[id number]]     Follow-up: Return in about 1 month (around 10/22/2019).    Terri Piedra, MSN, FNP-C Nurse Practitioner [facility] for Infectious Disease [place] number: [phone]

## 2019-09-22 NOTE — Progress Notes (Signed)
HPI: Candice [name] is a 34 y.o. female who presents to the [facility] clinic today to initiate care for a newly diagnosed HIV infection.  Patient Active Problem List   Diagnosis Date Noted  . HIV (human immunodeficiency virus infection) ([place]) 09/07/2019  . Screening examination for STD (sexually transmitted disease) 09/06/2019  . Traumatic ecchymosis of right female breast 11/10/2018  . Hemorrhagic cyst of left ovary 10/14/2016  . MRSA colonization 09/25/2014  . Major depressive disorder, recurrent episode, moderate ([place]) 06/08/2013  . Genital herpes 02/16/2013  . Back pain 10/13/2011  . TOBACCO USER 03/31/2009  . OBESITY, NOS 08/20/2006    Patient's Medications  New Prescriptions   No medications on file  Previous Medications   BICTEGRAVIR-EMTRICITABINE-TENOFOVIR AF (BIKTARVY) 50-200-25 MG TABS TABLET    Take 1 tablet by mouth daily.   CALCIUM CARBONATE (TUMS - DOSED IN MG ELEMENTAL CALCIUM) 500 MG CHEWABLE TABLET    Chew 2 tablets by mouth daily.   CYCLOBENZAPRINE (FLEXERIL) 10 MG TABLET    TAKE 1 TABLET BY MOUTH TWICE A DAY AS NEEDED FOR MUSCLE SPASMS   MUPIROCIN OINTMENT (BACTROBAN) 2 %    PLACE 1 APPLICATION INTO THE NOSE 2 (TWO) TIMES DAILY. FOR 7 DAYS.   SERTRALINE (ZOLOFT) 50 MG TABLET    Take 1 tablet (50 mg total) by mouth daily.  Modified Medications   No medications on file  Discontinued Medications   No medications on file    Allergies: No Known Allergies  Past Medical History: Past Medical History:  Diagnosis Date  . Complication of anesthesia    epidural with last pregnancy made entire left side numb, had to d/c  . Depression   . Headache   . HSV 06/28/2007  . Hx of migraines     Social History: Social History   Socioeconomic History  . Marital status: Single    Spouse name: Not on file  . Number of children: 2  . Years of education: 33  . Highest education level: Not on file  Occupational History  . Not on file  Tobacco Use  . Smoking status:  Current Every Day Smoker    Packs/day: 0.25    Types: Cigarettes  . Smokeless tobacco: Never Used  Substance and Sexual Activity  . Alcohol use: Yes    Comment: occasional  . Drug use: Yes    Frequency: 4.0 times per week    Types: Marijuana  . Sexual activity: Not Currently    Birth control/protection: None  Other Topics Concern  . Not on file  Social History Narrative   Works as Glass blower/designer.   FOB of both children lives in [place]; will be some what involved but mother is no longer dating him.   Social Determinants of Health   Financial Resource Strain:   . Difficulty of Paying Living Expenses:   Food Insecurity:   . Worried About Charity fundraiser in the Last Year:   . Arboriculturist in the Last Year:   Transportation Needs:   . Film/video editor (Medical):   Marland Kitchen Lack of Transportation (Non-Medical):   Physical Activity:   . Days of Exercise per Week:   . Minutes of Exercise per Session:   Stress:   . Feeling of Stress :   Social Connections:   . Frequency of Communication with Friends and Family:   . Frequency of Social Gatherings with Friends and Family:   . Attends Religious Services:   . Active Member  of Clubs or Organizations:   . Attends Archivist Meetings:   Marland Kitchen Marital Status:     Labs: Lab Results  Component Value Date   HIV1RNAQUANT 421 (H) 09/08/2019   CD4TABS 757 09/08/2019    RPR and STI Lab Results  Component Value Date   LABRPR NON-REACTIVE 09/08/2019   LABRPR Non Reactive 09/05/2019   LABRPR Non Reactive 10/16/2017   LABRPR NON REAC 07/13/2015   LABRPR NON REACTIVE 09/24/2013    STI Results GC CT  09/05/2019 Negative Negative  10/16/2017 Negative Negative  06/08/2017 Negative Negative  10/14/2016 Negative Negative  02/15/2016 Negative Negative  07/13/2015 Negative Negative  04/13/2015 Negative Negative  09/24/2013 NG: Negative CT: Negative  02/11/2011 - NEGATIVE  01/29/2010 - NEGATIVE  05/02/2009 - NEGATIVE    09/29/2008 - NEGATIVE  10/12/2007 - NEGATIVE  07/01/2007 - NEGATIVE  12/30/2006 - NEGATIVE    Hepatitis B Lab Results  Component Value Date   HEPBSAB REACTIVE (A) 09/08/2019   HEPBSAG NON-REACTIVE 09/08/2019   HEPBCAB NON-REACTIVE 09/08/2019   Hepatitis C Lab Results  Component Value Date   HEPCAB NON-REACTIVE 09/08/2019   Hepatitis A Lab Results  Component Value Date   HAV BORDERLINE (A) 09/08/2019   Lipids: Lab Results  Component Value Date   CHOL 166 09/08/2019   TRIG 70 09/08/2019   HDL 48 (L) 09/08/2019   CHOLHDL 3.5 09/08/2019   VLDL 10 02/14/2013   LDLCALC 102 (H) 09/08/2019    Current HIV Regimen: Treatment naive  Assessment: Candice [name] is here today to initiate care with [name] for her newly diagnosed HIV infection.  She is treatment naive with an initial HIV viral load of 421 and a CD4 count of 757.  No resistance mutations found on initial genotype. Will start patient on  [place].  Explained that [place] Odor is a one pill once daily medication with or without food and the importance of not missing any doses. Explained resistance and how it develops and why it is so important to take Biktarvy daily and not skip days or doses. Counseled patient to take it around the same time each day. Counseled on what to do if dose is missed, if closer to missed dose take immediately, if closer to next dose then skip and resume normal schedule.   Cautioned on possible side effects the first week or so including nausea, diarrhea, dizziness, and headaches but that they should resolve after the first couple of weeks. I reviewed patient medications and found no drug interactions. Counseled patient to separate Biktarvy from divalent cations including multivitamins. Discussed with patient to call clinic if she starts a new medication or herbal supplement. I gave the patient my card and told her to call me with any issues/questions/concerns.  She is insured with [employer], so I gave her a copay card.  She will call me with any issues.  Plan: - Start Biktarvy PO once daily  Willliam Pettet L. [place]ha Behlke, PharmD, BCIDP, AAHIVP, CPP Clinical Pharmacist Practitioner Infectious Diseases [place] 09/22/2019, 2:53 PM

## 2019-09-22 NOTE — Patient Instructions (Signed)
Nice to meet you  We will get you started on Biktarvy today.  Please let us know if you have any questions or concerns.  Plan for follow-up in 1 month or sooner if needed with lab work on the same day.  Have a great day and stay safe!

## 2019-09-22 NOTE — Assessment & Plan Note (Signed)
Candice [name] seems adequately controlled with no suicidal ideations or signs of psychosis at present currently maintained on Zoloft with no adverse side effects.  Continue management per primary care.

## 2019-10-30 ENCOUNTER — Other Ambulatory Visit: Payer: Self-pay | Admitting: Student in an Organized Health Care Education/Training Program

## 2019-11-08 ENCOUNTER — Ambulatory Visit (INDEPENDENT_AMBULATORY_CARE_PROVIDER_SITE_OTHER): Payer: BC Managed Care – PPO | Admitting: Family

## 2019-11-08 ENCOUNTER — Other Ambulatory Visit: Payer: Self-pay

## 2019-11-08 ENCOUNTER — Encounter: Payer: Self-pay | Admitting: Family

## 2019-11-08 VITALS — BP 127/90 | HR 74 | Temp 97.8°F | Ht 62.5 in | Wt 181.0 lb

## 2019-11-08 DIAGNOSIS — G479 Sleep disorder, unspecified: Secondary | ICD-10-CM

## 2019-11-08 DIAGNOSIS — Z21 Asymptomatic human immunodeficiency virus [HIV] infection status: Secondary | ICD-10-CM

## 2019-11-08 MED ORDER — BICTEGRAVIR-EMTRICITAB-TENOFOV 50-200-25 MG PO TABS: 1.0000 | ORAL_TABLET | Freq: Every day | ORAL | 2 refills | Status: DC

## 2019-11-08 NOTE — Assessment & Plan Note (Signed)
Ms. Pankiewicz has completed her first month of [place] with some feelings of illness that have since resolved. No signs/symptoms of opportunistic infection or progressive HIV disease. Reviewed previous lab work and discussed the plan of care. Continue current dose of Biktarvy. Check lab work today. Plan for follow up in 1 month or sooner if needed.

## 2019-11-08 NOTE — Progress Notes (Signed)
Subjective:    Patient ID: Candice [name], female    DOB: 10/04/1985, 34 y.o.   MRN: [id number]  Chief Complaint  Patient presents with  . Follow-up    Pt stated---weight loss, sleeping problem 2-3 years.Marland Kitchen     HPI:  Candice [name] is a 34 y.o. female with HIV disease last seen in the office on 09/22/2019 with newly diagnosed HIV-1 disease with initial CD4 nadir of 757 and viral load of 421.  Genotype was wild with no significant resistance.  She was started on Biktarvy and presents today for first initial follow-up.  Candice [name] has been taking her Biktarvy as prescribed daily. Initially had episode of loose stools and has concerns about weight loss. Did not feel overly well for a couple of weeks which has since resolved and is feeling better with no adverse side effects. Denies fevers, chills, night sweats, headaches, changes in vision, neck pain/stiffness, nausea, diarrhea, vomiting, lesions or rashes.  Candice [name] has no problems getting her medications from the pharmacy. Denies feelings of being down, depressed or hopeless. Continues to use marijuana and tobacco with occasional alcohol consumption. Not currently sexually active.   No Known Allergies    Outpatient Medications Prior to Visit  Medication Sig Dispense Refill  . cyclobenzaprine (FLEXERIL) 10 MG tablet TAKE 1 TABLET BY MOUTH TWICE A DAY AS NEEDED FOR MUSCLE SPASMS 45 tablet 0  . mupirocin ointment (BACTROBAN) 2 % PLACE 1 APPLICATION INTO THE NOSE 2 (TWO) TIMES DAILY. FOR 7 DAYS. 22 g 1  . sertraline (ZOLOFT) 50 MG tablet TAKE 1 TABLET BY MOUTH EVERY DAY 60 tablet 0  . bictegravir-emtricitabine-tenofovir AF (BIKTARVY) 50-200-25 MG TABS tablet Take 1 tablet by mouth daily. 30 tablet 2  . calcium carbonate (TUMS - DOSED IN MG ELEMENTAL CALCIUM) 500 MG chewable tablet Chew 2 tablets by mouth daily.     No facility-administered medications prior to visit.     Past Medical History:  Diagnosis Date  . Complication  of anesthesia    epidural with last pregnancy made entire left side numb, had to d/c  . Depression   . Headache   . HSV 06/28/2007  . Hx of migraines      Past Surgical History:  Procedure Laterality Date  . BREAST SURGERY     reduction  . CESAREAN SECTION    . CESAREAN SECTION  09/07/2011   Procedure: CESAREAN SECTION;  Surgeon: Florian Buff, MD;  Location: [facility];  Service: Gynecology;  Laterality: N/A;  Repeat cesarean section with delivery of baby boy at 55. Apgars 9/9.  Bilateral tubal ligation.  . IUD REMOVAL  2005   in cervix  . TUBAL LIGATION    . tubal reanastomosis  08/13/2016     Review of Systems  Constitutional: Negative for appetite change, chills, diaphoresis, fatigue, fever and unexpected weight change.  Eyes:       Negative for acute change in vision  Respiratory: Negative for chest tightness, shortness of breath and wheezing.   Cardiovascular: Negative for chest pain.  Gastrointestinal: Negative for diarrhea, nausea and vomiting.  Genitourinary: Negative for dysuria, pelvic pain and vaginal discharge.  Musculoskeletal: Negative for neck pain and neck stiffness.  Skin: Negative for rash.  Neurological: Negative for seizures, syncope, weakness and headaches.  Hematological: Negative for adenopathy. Does not bruise/bleed easily.  Psychiatric/Behavioral: Negative for hallucinations.      Objective:    BP 127/90   Pulse 74   Temp 97.8 F (36.6  C)   Ht 5' 2.5" (1.588 m)   Wt 181 lb (82.1 kg)   SpO2 99%   BMI 32.58 kg/m  Nursing note and vital signs reviewed.  Physical Exam Constitutional:      General: She is not in acute distress.    Appearance: She is well-developed.  Eyes:     Conjunctiva/sclera: Conjunctivae normal.  Cardiovascular:     Rate and Rhythm: Normal rate and regular rhythm.     Heart sounds: Normal heart sounds. No murmur. No friction rub. No gallop.   Pulmonary:     Effort: Pulmonary effort is normal. No respiratory distress.      Breath sounds: Normal breath sounds. No wheezing or rales.  Chest:     Chest wall: No tenderness.  Abdominal:     General: Bowel sounds are normal.     Palpations: Abdomen is soft.     Tenderness: There is no abdominal tenderness.  Musculoskeletal:     Cervical back: Neck supple.  Lymphadenopathy:     Cervical: No cervical adenopathy.  Skin:    General: Skin is warm and dry.     Findings: No rash.  Neurological:     Mental Status: She is alert and oriented to person, place, and time.  Psychiatric:        Behavior: Behavior normal.        Thought Content: Thought content normal.        Judgment: Judgment normal.      Depression screen [facility] 2/9 09/05/2019 11/10/2018 06/08/2018 10/16/2017 06/08/2017  Decreased Interest 0 0 0 0 0  Down, Depressed, Hopeless 0 0 0 0 0  PHQ - 2 Score 0 0 0 0 0  Altered sleeping - - - - -  Tired, decreased energy - - - - -  Change in appetite - - - - -  Feeling bad or failure about yourself  - - - - -  Trouble concentrating - - - - -  Moving slowly or fidgety/restless - - - - -  Suicidal thoughts - - - - -  PHQ-9 Score - - - - -       Assessment & Plan:    Patient Active Problem List   Diagnosis Date Noted  . Sleep disturbance 11/08/2019  . HIV (human immunodeficiency virus infection) ([place]) 09/07/2019  . Screening examination for STD (sexually transmitted disease) 09/06/2019  . Traumatic ecchymosis of right female breast 11/10/2018  . Hemorrhagic cyst of left ovary 10/14/2016  . MRSA colonization 09/25/2014  . Major depressive disorder, recurrent episode, moderate ([place]) 06/08/2013  . Genital herpes 02/16/2013  . Back pain 10/13/2011  . TOBACCO USER 03/31/2009  . OBESITY, NOS 08/20/2006     Problem List Items Addressed This Visit      Other   HIV (human immunodeficiency virus infection) ([place])    Candice [name] has completed her first month of [place] with some feelings of illness that have since resolved. No signs/symptoms of  opportunistic infection or progressive HIV disease. Reviewed previous lab work and discussed the plan of care. Continue current dose of Biktarvy. Check lab work today. Plan for follow up in 1 month or sooner if needed.       Relevant Medications   bictegravir-emtricitabine-tenofovir AF (BIKTARVY) 50-200-25 MG TABS tablet   Other Relevant Orders   COMPLETE METABOLIC PANEL WITH GFR   HIV-1 RNA quant-no reflex-bld   Sleep disturbance    Candice [name] describes sleep disturbance with feelings of waking up in  the middle of the night with shortness of breath and difficulty sleeping on her left side. Based on these descriptions recommend follow up with primary care for possible sleep apnea.           I have discontinued Candice [name]'s calcium carbonate. I am also having her maintain her mupirocin ointment, cyclobenzaprine, sertraline, and bictegravir-emtricitabine-tenofovir AF.   Meds ordered this encounter  Medications  . bictegravir-emtricitabine-tenofovir AF (BIKTARVY) 50-200-25 MG TABS tablet    Sig: Take 1 tablet by mouth daily.    Dispense:  30 tablet    Refill:  2    Order Specific Question:   Supervising Provider    Answer:   Carlyle Basques [[id number]]     Follow-up: Return in about 1 month (around 12/09/2019), or if symptoms worsen or fail to improve.   Terri Piedra, MSN, FNP-C Nurse Practitioner [facility] for Infectious Disease [place] number: [phone]

## 2019-11-08 NOTE — Patient Instructions (Signed)
Nice to see you.  We will check your blood work today.  Continue to take your [place] daily.  Refills have been sent to the pharamcy.  Check with your Primary Care about potential for sleep apnea.  Plan for follow up in 1 month or sooner if needed with lab work on the same day  Have a great day and stay safe!

## 2019-11-08 NOTE — Assessment & Plan Note (Signed)
Candice [name] describes sleep disturbance with feelings of waking up in the middle of the night with shortness of breath and difficulty sleeping on her left side. Based on these descriptions recommend follow up with primary care for possible sleep apnea.

## 2019-11-10 LAB — COMPLETE METABOLIC PANEL WITH GFR
AG Ratio: 1.5 (calc) (ref 1.0–2.5)
ALT: 10 U/L (ref 6–29)
AST: 14 U/L (ref 10–30)
Albumin: 4 g/dL (ref 3.6–5.1)
Alkaline phosphatase (APISO): 45 U/L (ref 31–125)
BUN: 10 mg/dL (ref 7–25)
CO2: 27 mmol/L (ref 20–32)
Calcium: 9.2 mg/dL (ref 8.6–10.2)
Chloride: 105 mmol/L (ref 98–110)
Creat: 0.87 mg/dL (ref 0.50–1.10)
GFR, Est African American: 100 mL/min/{1.73_m2} (ref >=60)
GFR, Est Non African American: 86 mL/min/{1.73_m2} (ref >=60)
Globulin: 2.6 g/dL (calc) (ref 1.9–3.7)
Glucose, Bld: 98 mg/dL (ref 65–99)
Potassium: 3.8 mmol/L (ref 3.5–5.3)
Sodium: 138 mmol/L (ref 135–146)
Total Bilirubin: 0.4 mg/dL (ref 0.2–1.2)
Total Protein: 6.6 g/dL (ref 6.1–8.1)

## 2019-11-10 LAB — HIV-1 RNA QUANT-NO REFLEX-BLD
HIV 1 RNA Quant: <20 copies/mL
HIV-1 RNA Quant, Log: <1.3 Log copies/mL

## 2019-12-09 ENCOUNTER — Ambulatory Visit (INDEPENDENT_AMBULATORY_CARE_PROVIDER_SITE_OTHER): Payer: BC Managed Care – PPO | Admitting: Family

## 2019-12-09 ENCOUNTER — Other Ambulatory Visit: Payer: Self-pay | Admitting: *Deleted

## 2019-12-09 ENCOUNTER — Other Ambulatory Visit: Payer: Self-pay

## 2019-12-09 ENCOUNTER — Encounter: Payer: Self-pay | Admitting: Family

## 2019-12-09 DIAGNOSIS — Z21 Asymptomatic human immunodeficiency virus [HIV] infection status: Secondary | ICD-10-CM | POA: Diagnosis not present

## 2019-12-09 MED ORDER — BICTEGRAVIR-EMTRICITAB-TENOFOV 50-200-25 MG PO TABS: 1.0000 | ORAL_TABLET | Freq: Every day | ORAL | 2 refills | Status: DC

## 2019-12-09 NOTE — Patient Instructions (Signed)
Nice to see you.  Continue to take your [place] daily as prescribed.  Refills will be sent to the pharmacy.   We will discuss injectable medications at your next office visit.   Plan for follow up in 2 months or sooner if needed with lab work on the same day.  The medication name is Cabenuva for the injectables.   Have a great day and stay safe!

## 2019-12-09 NOTE — Assessment & Plan Note (Addendum)
Candice [name] continues to have well-controlled HIV disease with good adherence and tolerance to her ART regimen of Biktarvy.  No signs/symptoms of opportunistic infection or progressive HIV disease.  Symptoms of bruising and fogginess unlikely related to [facility] although HIV may be contributing again although unlikely having well-controlled virus.  If symptoms continue recommend additional work-up through primary care.  [name] previous lab work and discussed the plan of care.  Introduced injectable medications which she is likely to pursue in the near future.  Check blood work today.  Continue current dose of Biktarvy in the meantime.  Plan for follow-up in 2 months or sooner if needed with lab work on the same day.

## 2019-12-09 NOTE — Progress Notes (Signed)
Subjective:    Patient ID: Candice [name], female    DOB: 07-31-1985, 34 y.o.   MRN: [id number]  Chief Complaint  Patient presents with   Follow-up    reports easily bruising; not improving;      HPI:  Candice [name] is a 34 y.o. female with HIV disease who was last seen in the office on 11/08/2019 with good adherence and tolerance to her ART regimen of Biktarvy.  Viral load at the time was undetectable with CD4 count of 757.  No recent blood work completed.  Here today for routine follow-up.  Candice [name] continues to take her Biktarvy daily as prescribed with no adverse side effects or missed doses since her last office visit.  Overall feeling well today.  She does have concern about easily bruising as well as having some fogginess of thought at times which was started following initiation of treatment with Biktarvy.  Has several questions regarding injectable medications. Denies fevers, chills, night sweats, headaches, changes in vision, neck pain/stiffness, nausea, diarrhea, vomiting, lesions or rashes.  Candice [name] has no problems obtaining her medication from the pharmacy and remains covered through [facility].  No recreational or illicit drug use, tobacco use, or alcohol consumption.  No feelings of being down, depressed, or hopeless recently.   No Known Allergies    Outpatient Medications Prior to Visit  Medication Sig Dispense Refill   bictegravir-emtricitabine-tenofovir AF (BIKTARVY) 50-200-25 MG TABS tablet Take 1 tablet by mouth daily. 30 tablet 2   cyclobenzaprine (FLEXERIL) 10 MG tablet TAKE 1 TABLET BY MOUTH TWICE A DAY AS NEEDED FOR MUSCLE SPASMS 45 tablet 0   mupirocin ointment (BACTROBAN) 2 % PLACE 1 APPLICATION INTO THE NOSE 2 (TWO) TIMES DAILY. FOR 7 DAYS. 22 g 1   sertraline (ZOLOFT) 50 MG tablet TAKE 1 TABLET BY MOUTH EVERY DAY 60 tablet 0   No facility-administered medications prior to visit.     Past Medical History:  Diagnosis Date    Complication of anesthesia    epidural with last pregnancy made entire left side numb, had to d/c   Depression    Headache    HSV 06/28/2007   Hx of migraines      Past Surgical History:  Procedure Laterality Date   BREAST SURGERY     reduction   CESAREAN SECTION     CESAREAN SECTION  09/07/2011   Procedure: CESAREAN SECTION;  Surgeon: Florian Buff, MD;  Location: [facility];  Service: Gynecology;  Laterality: N/A;  Repeat cesarean section with delivery of baby boy at 38. Apgars 9/9.  Bilateral tubal ligation.   IUD REMOVAL  2005   in cervix   TUBAL LIGATION     tubal reanastomosis  08/13/2016       Review of Systems  Constitutional: Negative for appetite change, chills, diaphoresis, fatigue, fever and unexpected weight change.  Eyes:       Negative for acute change in vision  Respiratory: Negative for chest tightness, shortness of breath and wheezing.   Cardiovascular: Negative for chest pain.  Gastrointestinal: Negative for diarrhea, nausea and vomiting.  Genitourinary: Negative for dysuria, pelvic pain and vaginal discharge.  Musculoskeletal: Negative for neck pain and neck stiffness.  Skin: Negative for rash.  Neurological: Negative for seizures, syncope, weakness and headaches.  Hematological: Negative for adenopathy. Does not bruise/bleed easily.  Psychiatric/Behavioral: Negative for hallucinations.      Objective:    BP 108/74    Pulse 100  Nursing note  and vital signs reviewed.  Physical Exam Constitutional:      General: She is not in acute distress.    Appearance: She is well-developed.  Eyes:     Conjunctiva/sclera: Conjunctivae normal.  Cardiovascular:     Rate and Rhythm: Normal rate and regular rhythm.     Heart sounds: Normal heart sounds. No murmur heard.  No friction rub. No gallop.   Pulmonary:     Effort: Pulmonary effort is normal. No respiratory distress.     Breath sounds: Normal breath sounds. No wheezing or rales.  Chest:      Chest wall: No tenderness.  Abdominal:     General: Bowel sounds are normal.     Palpations: Abdomen is soft.     Tenderness: There is no abdominal tenderness.  Musculoskeletal:     Cervical back: Neck supple.  Lymphadenopathy:     Cervical: No cervical adenopathy.  Skin:    General: Skin is warm and dry.     Findings: No rash.  Neurological:     Mental Status: She is alert and oriented to person, place, and time.  Psychiatric:        Behavior: Behavior normal.        Thought Content: Thought content normal.        Judgment: Judgment normal.      Depression screen [facility] 2/9 12/09/2019 09/05/2019 11/10/2018 06/08/2018 10/16/2017  Decreased Interest 0 0 0 0 0  Down, Depressed, Hopeless 0 0 0 0 0  PHQ - 2 Score 0 0 0 0 0  Altered sleeping - - - - -  Tired, decreased energy - - - - -  Change in appetite - - - - -  Feeling bad or failure about yourself  - - - - -  Trouble concentrating - - - - -  Moving slowly or fidgety/restless - - - - -  Suicidal thoughts - - - - -  PHQ-9 Score - - - - -       Assessment & Plan:    Patient Active Problem List   Diagnosis Date Noted   Sleep disturbance 11/08/2019   HIV (human immunodeficiency virus infection) ([place]) 09/07/2019   Screening examination for STD (sexually transmitted disease) 09/06/2019   Traumatic ecchymosis of right female breast 11/10/2018   Hemorrhagic cyst of left ovary 10/14/2016   MRSA colonization 09/25/2014   Major depressive disorder, recurrent episode, moderate ([place]) 06/08/2013   Genital herpes 02/16/2013   Back pain 10/13/2011   TOBACCO USER 03/31/2009   OBESITY, NOS 08/20/2006     Problem List Items Addressed This Visit      Other   HIV (human immunodeficiency virus infection) ([place])    Candice [name] continues to have well-controlled HIV disease with good adherence and tolerance to her ART regimen of Biktarvy.  No signs/symptoms of opportunistic infection or progressive HIV disease.  Symptoms of  bruising and fogginess unlikely related to [facility] although HIV may be contributing again although unlikely having well-controlled virus.  If symptoms continue recommend additional work-up through primary care.  [name] previous lab work and discussed the plan of care.  Introduced injectable medications which she is likely to pursue in the near future.  Check blood work today.  Continue current dose of Biktarvy in the meantime.  Plan for follow-up in 2 months or sooner if needed with lab work on the same day.      Relevant Medications   bictegravir-emtricitabine-tenofovir AF (BIKTARVY) 50-200-25 MG TABS tablet  I am having Candice [name] maintain her mupirocin ointment, cyclobenzaprine, sertraline, and bictegravir-emtricitabine-tenofovir AF.   Meds ordered this encounter  Medications   bictegravir-emtricitabine-tenofovir AF (BIKTARVY) 50-200-25 MG TABS tablet    Sig: Take 1 tablet by mouth daily.    Dispense:  30 tablet    Refill:  2    Order Specific Question:   Supervising Provider    Answer:   Carlyle Basques [[id number]]     Follow-up: Return in about 2 months (around 02/08/2020).   Terri Piedra, MSN, FNP-C Nurse Practitioner [facility] for Infectious Disease [place] number: [phone]

## 2019-12-11 LAB — HIV-1 RNA QUANT-NO REFLEX-BLD
HIV 1 RNA Quant: <20 copies/mL
HIV-1 RNA Quant, Log: <1.3 Log copies/mL

## 2020-01-15 ENCOUNTER — Telehealth: Payer: Self-pay | Admitting: Student in an Organized Health Care Education/Training Program

## 2020-01-17 ENCOUNTER — Telehealth: Payer: Self-pay | Admitting: Psychology

## 2020-01-17 NOTE — Telephone Encounter (Signed)
Hey, it looks like our mutual patient is looking for therapy. Would you like to reach out to her to set something up?

## 2020-01-17 NOTE — Telephone Encounter (Signed)
Done

## 2020-01-17 NOTE — Telephone Encounter (Signed)
Called and left VM to set up [facility] appt per pt request

## 2020-01-20 ENCOUNTER — Telehealth: Payer: Self-pay | Admitting: Psychology

## 2020-01-20 NOTE — Telephone Encounter (Signed)
Second attempt to call pt.  Left VM to call back if would like appt.

## 2020-02-01 ENCOUNTER — Encounter: Payer: Self-pay | Admitting: Student in an Organized Health Care Education/Training Program

## 2020-02-01 ENCOUNTER — Other Ambulatory Visit: Payer: Self-pay

## 2020-02-01 ENCOUNTER — Ambulatory Visit (INDEPENDENT_AMBULATORY_CARE_PROVIDER_SITE_OTHER): Payer: BC Managed Care – PPO | Admitting: Student in an Organized Health Care Education/Training Program

## 2020-02-01 VITALS — BP 107/80 | HR 86 | Ht 63.0 in | Wt 179.0 lb

## 2020-02-01 DIAGNOSIS — R202 Paresthesia of skin: Secondary | ICD-10-CM

## 2020-02-01 DIAGNOSIS — R200 Anesthesia of skin: Secondary | ICD-10-CM | POA: Diagnosis not present

## 2020-02-01 NOTE — Progress Notes (Signed)
   SUBJECTIVE:   CHIEF COMPLAINT / HPI: "diagnosed with some craziness"  Right leg numbness. Started on right side of abdomen and spread downwards to include whole leg.  Feeling is constant.  Worsened by wearing tight shoes.  Has to take shoe off when gets really bad and that will make it better. Does not effect sleep. No rashes. No noticeable weakness.  No fevers Does not wear tight clothing or belts. No pain in distribution or in back.  Additionally, patient states that she has had worsening dizziness and memory loss in the past few months which is why she has stopped taking her depression medications.   Depression/anxiety-patient has discontinued her Zoloft approximately 2 months ago.  OBJECTIVE:   BP 107/80   Pulse 86   Ht 5\' 3"  (1.6 m)   Wt 179 lb (81.2 kg)   LMP 01/23/2020 (Exact Date)   SpO2 100%   BMI 31.71 kg/m   General: NAD, pleasant, able to participate in exam Skin: warm and dry, no rashes noted Neuro: alert and oriented x4 Psych: Normal affect and mood Back: neg tenderness to palpation Lower extremities: sensation decreased on R thigh diffusely and lateral calf. Normal on left. Strength intact bilaterally. Neg atrophy. ROM intact bilaterally. Patellar reflexes are symmetrical bilaterally Neg straight leg raise  ASSESSMENT/PLAN:   Right leg numbness Right leg numbness in somewhat dermatomal distribution concerning for lumbar spine nerve root involvement. Other neuro symptoms are unlikely related Practiced shared decision-making with patient on treatment plan as discussed with Dr. Ardelia Mems. - ordered lumbar MRI - referral to neurology     Richarda Osmond, [place]

## 2020-02-01 NOTE — Patient Instructions (Signed)
It was a pleasure to see you today!  To summarize our discussion for this visit:  I am ordering an MRI of the lumbar spine today to evaluate for any causes of your symptoms that could be there.  I have also sent a referral to a neurology specialist to go over your symptoms and evaluate for the cause as well that could be related to your memory loss and dizziness.  Some additional health maintenance measures we should update are: Health Maintenance Due  Topic Date Due  . COVID-19 Vaccine (1) Never done  . INFLUENZA VACCINE  01/22/2020  .     Call the clinic at [phone] if your symptoms worsen or you have any concerns.   Thank you for allowing me to take part in your care,  Dr. Doristine Mango

## 2020-02-06 ENCOUNTER — Other Ambulatory Visit: Payer: Self-pay

## 2020-02-06 ENCOUNTER — Ambulatory Visit (INDEPENDENT_AMBULATORY_CARE_PROVIDER_SITE_OTHER): Payer: BC Managed Care – PPO | Admitting: Psychology

## 2020-02-06 DIAGNOSIS — F331 Major depressive disorder, recurrent, moderate: Secondary | ICD-10-CM | POA: Diagnosis not present

## 2020-02-07 DIAGNOSIS — R200 Anesthesia of skin: Secondary | ICD-10-CM

## 2020-02-07 HISTORY — DX: Anesthesia of skin: R20.0

## 2020-02-07 NOTE — BH Specialist Note (Signed)
Integrated Behavioral Health Visit   02/07/2020 DARRIEL [place] [id number]   Session Start time: 66  Session End time: 430 Total time: 61  Referring Provider: Dr. Prince Rome   Confidentiality has been reviewed with patient.  Patient verbalized consent for a integrated care visit.   PRESENTING CONCERNS: Patient and/or family reports the following symptoms/concerns: Pt reported she has been struggling with family dynamics.  She reported that both her mother and sister have recently come back into her life due to illness.  Pt shared both of them have a substance abuse disorder.  Pt shared her hx of childhood trauma and how this impacts her relationships.  Duration of problem: acutely last few months; Severity of problem: moderate  STRENGTHS (Protective Factors/Coping Skills): Insight, supportive sister   GOALS ADDRESSED: Patient will: 1.  Reduce symptoms of: depression: pt experiencing feelings of being overwhelmed, tired, sleep issues, low energy and appetite issues  2.  Increase knowledge and/or ability of: self-management skills: pt to gain more understanding of herself in family dynamics 3.  Demonstrate ability to: Increase healthy adjustment to current life circumstances  INTERVENTIONS: Interventions utilized:  Supportive Counseling Standardized Assessments completed: PHQ 9  ASSESSMENT: Patient currently experiencing depression related to past trauma and family dyanmics.   Patient may benefit from family systems work and supportive counseling  Patient and Dr. Hartford Poli reviewed and verbally consented to plan.  PLAN: 1. Follow up with behavioral health clinician on : reflection from last appt  2. Behavioral recommendations: f/u therapy; pt request to f/u in month 3. Referral(s): [place] (In Clinic)  Erlinda Hong, PhD., LMFT-A

## 2020-02-07 NOTE — Assessment & Plan Note (Signed)
Right leg numbness in somewhat dermatomal distribution concerning for lumbar spine nerve root involvement. Other neuro symptoms are unlikely related Practiced shared decision-making with patient on treatment plan as discussed with Dr. Ardelia Mems. - ordered lumbar MRI - referral to neurology

## 2020-02-09 ENCOUNTER — Ambulatory Visit (HOSPITAL_COMMUNITY): Payer: BC Managed Care – PPO

## 2020-02-09 NOTE — Telephone Encounter (Signed)
See other mychart messages

## 2020-02-10 ENCOUNTER — Ambulatory Visit (INDEPENDENT_AMBULATORY_CARE_PROVIDER_SITE_OTHER): Payer: BC Managed Care – PPO | Admitting: Family

## 2020-02-10 ENCOUNTER — Other Ambulatory Visit: Payer: Self-pay

## 2020-02-10 ENCOUNTER — Encounter: Payer: Self-pay | Admitting: Family

## 2020-02-10 ENCOUNTER — Other Ambulatory Visit: Payer: Self-pay | Admitting: Student in an Organized Health Care Education/Training Program

## 2020-02-10 VITALS — BP 121/76 | HR 80 | Temp 98.0°F | Wt 183.0 lb

## 2020-02-10 DIAGNOSIS — B20 Human immunodeficiency virus [HIV] disease: Secondary | ICD-10-CM

## 2020-02-10 DIAGNOSIS — B370 Candidal stomatitis: Secondary | ICD-10-CM | POA: Insufficient documentation

## 2020-02-10 DIAGNOSIS — Z0000 Encounter for general adult medical examination without abnormal findings: Secondary | ICD-10-CM | POA: Diagnosis not present

## 2020-02-10 DIAGNOSIS — Z23 Encounter for immunization: Secondary | ICD-10-CM

## 2020-02-10 DIAGNOSIS — Z21 Asymptomatic human immunodeficiency virus [HIV] infection status: Secondary | ICD-10-CM | POA: Diagnosis not present

## 2020-02-10 DIAGNOSIS — R202 Paresthesia of skin: Secondary | ICD-10-CM

## 2020-02-10 LAB — T-HELPER CELL (CD4) - (RCID CLINIC ONLY)
CD4 % Helper T Cell: 50 % (ref 33–65)
CD4 T Cell Abs: 1233 /uL (ref 400–1790)

## 2020-02-10 MED ORDER — NYSTATIN 100000 UNIT/ML MT SUSP
5.0000 mL | Freq: Four times a day (QID) | OROMUCOSAL | 0 refills | Status: DC
Start: 2020-02-10 — End: 2020-02-10

## 2020-02-10 MED ORDER — NYSTATIN 100000 UNIT/ML MT SUSP
OROMUCOSAL | 0 refills | Status: DC
Start: 2020-02-10 — End: 2020-04-12

## 2020-02-10 MED ORDER — BICTEGRAVIR-EMTRICITAB-TENOFOV 50-200-25 MG PO TABS: 1.0000 | ORAL_TABLET | Freq: Every day | ORAL | 2 refills | Status: DC

## 2020-02-10 NOTE — Assessment & Plan Note (Signed)
Candice [name] has oral symptoms consistent with thrush.  We will start nystatin swishes.  If refractory may consider fluconazole.  No evidence of esophagitis at present.

## 2020-02-10 NOTE — Progress Notes (Signed)
Re-ordered lumbar MRI per patient- needed by insurance company

## 2020-02-10 NOTE — Patient Instructions (Addendum)
Nice to see you.  We will check your lab work today.  Continue to take your [place] daily as prescribed.  Refills will be sent to the pharmacy.   Use the Nystatin mouth wash 4x daily - swish for several minutes and swallow or spit out.   We will work on getting you switched to [place] once available.  Recommend COVID vaccine.  Have a great day and stay safe!

## 2020-02-10 NOTE — Assessment & Plan Note (Signed)
   Discussed/recommended Covid vaccine.  Menveo updated today.  Declined pneumonia vaccinations.  Discussed importance of safe sexual practice to reduce risk of STI.  Condoms declined.

## 2020-02-10 NOTE — Assessment & Plan Note (Signed)
Ms. Desir continues to have well-controlled HIV disease with good adherence and tolerance to her ART regimen of Biktarvy.  No signs/symptoms of opportunistic infection or progressive HIV disease.  [name] lab work and discussed the plan of care.  Check blood work today.  Continue current dose of Biktarvy.  Plan for follow-up in 1 month or sooner if needed when we will consider switching her to the injectable medication series when available.

## 2020-02-10 NOTE — Progress Notes (Signed)
Subjective:    Patient ID: Candice [name], female    DOB: 1986-05-23, 34 y.o.   MRN: [id number]  Chief Complaint  Patient presents with   Follow-up    b20     HPI:  Candice [name] is a 34 y.o. female with HIV-1 disease initially diagnosed in March 2021 with initial CD4 nadir of 757 and viral load of 421 risk factor for HIV being heterosexual contact who was last seen in the office on 12/09/2019 with good adherence and tolerance to her ART regimen of Biktarvy.  Viral load at the time was undetectable.  Here today for routine follow-up.  Candice [name] continues to take her Biktarvy daily as prescribed with no adverse side effects or missed doses since her last office visit.  She has been having pain and decreased sensation in her right leg which has been evaluated by primary care and has MRI and referral to neurology pending.  She has also had increased gassiness lately.  Has concerned about possible thrush in her mouth Denies fevers, chills, night sweats, headaches, changes in vision, neck pain/stiffness, nausea, diarrhea, vomiting, lesions or rashes.  Candice [name] has no problems obtaining her medications remains covered through [facility].  Denies feelings of being down, depressed, or hopeless recently.  Continues to use marijuana approximately 4 times per week on average and drinks alcohol on occasion.  Is a current everyday smoker approximately 1/4 pack/day.  Eager to switch medications to the injectable when able.  Has not received Covid.   No Known Allergies    Outpatient Medications Prior to Visit  Medication Sig Dispense Refill   bictegravir-emtricitabine-tenofovir AF (BIKTARVY) 50-200-25 MG TABS tablet Take 1 tablet by mouth daily. 30 tablet 2   mupirocin ointment (BACTROBAN) 2 % PLACE 1 APPLICATION INTO THE NOSE 2 (TWO) TIMES DAILY. FOR 7 DAYS. 22 g 1   cyclobenzaprine (FLEXERIL) 10 MG tablet TAKE 1 TABLET BY MOUTH TWICE A DAY AS NEEDED FOR MUSCLE SPASMS  (Patient not taking: Reported on 02/10/2020) 45 tablet 0   No facility-administered medications prior to visit.     Past Medical History:  Diagnosis Date   Complication of anesthesia    epidural with last pregnancy made entire left side numb, had to d/c   Depression    Headache    HSV 06/28/2007   Hx of migraines      Past Surgical History:  Procedure Laterality Date   BREAST SURGERY     reduction   CESAREAN SECTION     CESAREAN SECTION  09/07/2011   Procedure: CESAREAN SECTION;  Surgeon: Florian Buff, MD;  Location: [facility];  Service: Gynecology;  Laterality: N/A;  Repeat cesarean section with delivery of baby boy at 81. Apgars 9/9.  Bilateral tubal ligation.   IUD REMOVAL  2005   in cervix   TUBAL LIGATION     tubal reanastomosis  08/13/2016       Review of Systems  Constitutional: Negative for appetite change, chills, diaphoresis, fatigue, fever and unexpected weight change.  Eyes:       Negative for acute change in vision  Respiratory: Negative for chest tightness, shortness of breath and wheezing.   Cardiovascular: Negative for chest pain.  Gastrointestinal: Negative for diarrhea, nausea and vomiting.  Genitourinary: Negative for dysuria, pelvic pain and vaginal discharge.  Musculoskeletal: Negative for neck pain and neck stiffness.  Skin: Negative for rash.  Neurological: Positive for numbness. Negative for seizures, syncope, weakness and headaches.  Hematological:  Negative for adenopathy. Does not bruise/bleed easily.  Psychiatric/Behavioral: Negative for hallucinations.      Objective:    BP 121/76    Pulse 80    Temp 98 F (36.7 C) (Oral)    Wt 183 lb (83 kg)    LMP 01/23/2020 (Exact Date)    BMI 32.42 kg/m  Nursing note and vital signs reviewed.  Physical Exam Constitutional:      General: She is not in acute distress.    Appearance: She is well-developed.  HENT:     Mouth/Throat:     Comments: Small areas of white patchiness likely  consistent with thrush Cardiovascular:     Rate and Rhythm: Normal rate and regular rhythm.     Heart sounds: Normal heart sounds.  Pulmonary:     Effort: Pulmonary effort is normal.     Breath sounds: Normal breath sounds.  Skin:    General: Skin is warm and dry.  Neurological:     Mental Status: She is alert and oriented to person, place, and time.  Psychiatric:        Behavior: Behavior normal.        Thought Content: Thought content normal.        Judgment: Judgment normal.      Depression screen [facility] 2/9 02/10/2020 02/07/2020 02/01/2020 12/09/2019 09/05/2019  Decreased Interest 0 0 0 0 0  Down, Depressed, Hopeless 1 0 0 0 0  PHQ - 2 Score 1 0 0 0 0  Altered sleeping 0 1 1 - -  Tired, decreased energy 1 1 0 - -  Change in appetite 0 1 1 - -  Feeling bad or failure about yourself  0 0 0 - -  Trouble concentrating 1 0 0 - -  Moving slowly or fidgety/restless 0 0 0 - -  Suicidal thoughts 0 0 0 - -  PHQ-9 Score 3 3 2  - -       Assessment & Plan:    Patient Active Problem List   Diagnosis Date Noted   Right leg numbness 02/07/2020   Sleep disturbance 11/08/2019   HIV (human immunodeficiency virus infection) ([place]) 09/07/2019   Hemorrhagic cyst of left ovary 10/14/2016   MRSA colonization 09/25/2014   Major depressive disorder, recurrent episode, moderate ([place]) 06/08/2013   Genital herpes 02/16/2013   Back pain 10/13/2011   TOBACCO USER 03/31/2009   OBESITY, NOS 08/20/2006     Problem List Items Addressed This Visit    None       I am having Chalonda LThurnell Garbe maintain her mupirocin ointment, cyclobenzaprine, and bictegravir-emtricitabine-tenofovir AF.   No orders of the defined types were placed in this encounter.    Follow-up: No follow-ups on file.   Terri Piedra, MSN, FNP-C Nurse Practitioner [facility] [place] number: [phone]

## 2020-02-11 ENCOUNTER — Ambulatory Visit (HOSPITAL_COMMUNITY)
Admission: RE | Admit: 2020-02-11 | Discharge: 2020-02-11 | Disposition: A | Discharge status: Home / Self Care | Payer: BC Managed Care – PPO | Source: Ambulatory Visit | Attending: Family Medicine | Admitting: Family Medicine

## 2020-02-11 DIAGNOSIS — M5116 Intervertebral disc disorders with radiculopathy, lumbar region: Secondary | ICD-10-CM | POA: Diagnosis not present

## 2020-02-11 DIAGNOSIS — R202 Paresthesia of skin: Secondary | ICD-10-CM | POA: Diagnosis not present

## 2020-02-11 DIAGNOSIS — M4727 Other spondylosis with radiculopathy, lumbosacral region: Secondary | ICD-10-CM | POA: Diagnosis not present

## 2020-02-11 DIAGNOSIS — M4726 Other spondylosis with radiculopathy, lumbar region: Secondary | ICD-10-CM | POA: Diagnosis not present

## 2020-02-11 DIAGNOSIS — M5117 Intervertebral disc disorders with radiculopathy, lumbosacral region: Secondary | ICD-10-CM | POA: Diagnosis not present

## 2020-02-11 MED ORDER — GADOBUTROL 1 MMOL/ML IV SOLN
7.0000 mL | Freq: Once | INTRAVENOUS | Status: AC | PRN
  Administered 2020-02-11: 7 mL via INTRAVENOUS

## 2020-02-12 LAB — COMPLETE METABOLIC PANEL WITH GFR
AG Ratio: 1.5 (calc) (ref 1.0–2.5)
ALT: 9 U/L (ref 6–29)
AST: 16 U/L (ref 10–30)
Albumin: 4.1 g/dL (ref 3.6–5.1)
Alkaline phosphatase (APISO): 50 U/L (ref 31–125)
BUN: 11 mg/dL (ref 7–25)
CO2: 25 mmol/L (ref 20–32)
Calcium: 9 mg/dL (ref 8.6–10.2)
Chloride: 105 mmol/L (ref 98–110)
Creat: 0.72 mg/dL (ref 0.50–1.10)
GFR, Est African American: 125 mL/min/{1.73_m2} (ref >=60)
GFR, Est Non African American: 108 mL/min/{1.73_m2} (ref >=60)
Globulin: 2.7 g/dL (calc) (ref 1.9–3.7)
Glucose, Bld: 100 mg/dL — ABNORMAL HIGH (ref 65–99)
Potassium: 3.4 mmol/L — ABNORMAL LOW (ref 3.5–5.3)
Sodium: 138 mmol/L (ref 135–146)
Total Bilirubin: 0.3 mg/dL (ref 0.2–1.2)
Total Protein: 6.8 g/dL (ref 6.1–8.1)

## 2020-02-12 LAB — HIV-1 RNA QUANT-NO REFLEX-BLD
HIV 1 RNA Quant: <20 Copies/mL
HIV-1 RNA Quant, Log: <1.3 Log cps/mL

## 2020-02-13 ENCOUNTER — Encounter: Payer: Self-pay | Admitting: Diagnostic Neuroimaging

## 2020-02-13 ENCOUNTER — Ambulatory Visit (INDEPENDENT_AMBULATORY_CARE_PROVIDER_SITE_OTHER): Payer: BC Managed Care – PPO | Admitting: Diagnostic Neuroimaging

## 2020-02-13 VITALS — BP 118/76 | HR 78 | Ht 63.0 in | Wt 183.0 lb

## 2020-02-13 DIAGNOSIS — R200 Anesthesia of skin: Secondary | ICD-10-CM | POA: Diagnosis not present

## 2020-02-13 DIAGNOSIS — M546 Pain in thoracic spine: Secondary | ICD-10-CM | POA: Diagnosis not present

## 2020-02-13 DIAGNOSIS — R202 Paresthesia of skin: Secondary | ICD-10-CM

## 2020-02-13 DIAGNOSIS — M79604 Pain in right leg: Secondary | ICD-10-CM | POA: Diagnosis not present

## 2020-02-13 NOTE — Patient Instructions (Signed)
-   check MRI thoracic spine, labs, RLE u/s  - may consider EMG/NCS; may consider PT / sports med evaluation

## 2020-02-13 NOTE — Progress Notes (Addendum)
[facility]  PATIENT: Candice [name] DOB: [birth year]  REFERRING CLINICIAN: Doristine Mango L, DO HISTORY FROM: PATIENT  REASON FOR VISIT: NEW CONSULT    HISTORICAL  CHIEF COMPLAINT:  Chief Complaint  Patient presents with  . Numbness, weakness right leg    rm 7 New Pt niece- Lenda Kelp "started 2 months ago, started in my back and came around to my stomach like I had something tight on but I didn't, right leg is numb; skin sensitivity on my back"  . Dizziness    "off balance, some memory loss; ? due to new med- Biktarvy started in March"    HISTORY OF PRESENT ILLNESS:   34 year old female here for evaluation of right leg numbness.  March 2021 patient was diagnosed with HIV and started on antiviral therapy.  In May/June 2021 when she noticed pain and numbness around her right flank region, radiating around to her right hip and right leg.  Her entire right leg feels numb.  She feels some weakness.  She is slightly dragging her leg.  She is noted some increasing swelling in her right leg.  She denies any rash or shingles outbreak.  At rest she has no pain.  She is sensitive to touch on her skin in the right leg.  She has noticed some slight right arm problems in the last few weeks.   REVIEW OF SYSTEMS: Full 14 system review of systems performed and negative with exception of: As per HPI.  ALLERGIES: No Known Allergies  HOME MEDICATIONS: Outpatient Medications Prior to Visit  Medication Sig Dispense Refill  . bictegravir-emtricitabine-tenofovir AF (BIKTARVY) 50-200-25 MG TABS tablet Take 1 tablet by mouth daily. 30 tablet 2  . cyclobenzaprine (FLEXERIL) 10 MG tablet TAKE 1 TABLET BY MOUTH TWICE A DAY AS NEEDED FOR MUSCLE SPASMS 45 tablet 0  . mupirocin ointment (BACTROBAN) 2 % PLACE 1 APPLICATION INTO THE NOSE 2 (TWO) TIMES DAILY. FOR 7 DAYS. 22 g 1  . nystatin (MYCOSTATIN) 100000 UNIT/ML suspension Swish 5 ml of solution in mouth for several minutes then  spit or swallow four times daily. 120 mL 0   No facility-administered medications prior to visit.    PAST MEDICAL HISTORY: Past Medical History:  Diagnosis Date  . Complication of anesthesia    epidural with last pregnancy made entire left side numb, had to d/c  . Depression   . Headache   . HSV 06/28/2007  . Hx of migraines     PAST SURGICAL HISTORY: Past Surgical History:  Procedure Laterality Date  . BREAST SURGERY  2019   reduction  . CESAREAN SECTION    . CESAREAN SECTION  09/07/2011   Procedure: CESAREAN SECTION;  Surgeon: Florian Buff, MD;  Location: [facility];  Service: Gynecology;  Laterality: N/A;  Repeat cesarean section with delivery of baby boy at 90. Apgars 9/9.  Bilateral tubal ligation.  . IUD REMOVAL  2005   in cervix  . TUBAL LIGATION    . tubal reanastomosis  08/13/2016    FAMILY HISTORY: Family History  Problem Relation Age of Onset  . Alcohol abuse Mother   . Drug abuse Mother   . Depression Mother   . Alcohol abuse Father   . Drug abuse Father   . Mental illness Sister        bipolar ptsd  . Schizophrenia Sister   . Diabetes Maternal Grandmother   . Hypertension Maternal Grandmother   . Cancer Maternal Grandmother  ovarian, breast and lung  . Depression Maternal Grandmother     SOCIAL HISTORY: Social History   Socioeconomic History  . Marital status: Single    Spouse name: Not on file  . Number of children: 2  . Years of education: 54  . Highest education level: Not on file  Occupational History    Comment: grocery manager  Tobacco Use  . Smoking status: Current Every Day Smoker    Packs/day: 0.25    Types: Cigarettes  . Smokeless tobacco: Never Used  Vaping Use  . Vaping Use: Never used  Substance and Sexual Activity  . Alcohol use: Yes    Comment: occasional  . Drug use: Yes    Frequency: 4.0 times per week    Types: Marijuana  . Sexual activity: Not Currently    Partners: Male    Birth control/protection: None   Other Topics Concern  . Not on file  Social History Narrative   Works as Ecologist.   FOB of both children lives in [place]; will be some what involved but mother is no longer dating him.   Social Determinants of Health   Financial Resource Strain:   . Difficulty of Paying Living Expenses: Not on file  Food Insecurity:   . Worried About Charity fundraiser in the Last Year: Not on file  . Ran Out of Food in the Last Year: Not on file  Transportation Needs:   . Lack of Transportation (Medical): Not on file  . Lack of Transportation (Non-Medical): Not on file  Physical Activity:   . Days of Exercise per Week: Not on file  . Minutes of Exercise per Session: Not on file  Stress:   . Feeling of Stress : Not on file  Social Connections:   . Frequency of Communication with Friends and Family: Not on file  . Frequency of Social Gatherings with Friends and Family: Not on file  . Attends Religious Services: Not on file  . Active Member of Clubs or Organizations: Not on file  . Attends Archivist Meetings: Not on file  . Marital Status: Not on file  Intimate Partner Violence:   . Fear of Current or Ex-Partner: Not on file  . Emotionally Abused: Not on file  . Physically Abused: Not on file  . Sexually Abused: Not on file     PHYSICAL EXAM  GENERAL EXAM/CONSTITUTIONAL: Vitals:  Vitals:   02/13/20 0953  BP: 118/76  Pulse: 78  Weight: 183 lb (83 kg)  Height: 5\' 3"  (1.6 m)     Body mass index is 32.42 kg/m. Wt Readings from Last 3 Encounters:  02/13/20 183 lb (83 kg)  02/10/20 183 lb (83 kg)  02/01/20 179 lb (81.2 kg)     Patient is in no distress; well developed, nourished and groomed; neck is supple  CARDIOVASCULAR:  Examination of carotid arteries is normal; no carotid bruits  Regular rate and rhythm, no murmurs  Examination of peripheral vascular system by observation and palpation is normal  EYES:  Ophthalmoscopic exam of optic discs and  posterior segments is normal; no papilledema or hemorrhages  No exam data present  MUSCULOSKELETAL:  Gait, strength, tone, movements noted in Neurologic exam below  NEUROLOGIC: MENTAL STATUS:  No flowsheet data found.  awake, alert, oriented to person, place and time  recent and remote memory intact  normal attention and concentration  language fluent, comprehension intact, naming intact  fund of knowledge appropriate  CRANIAL NERVE:  2nd - no papilledema on fundoscopic exam  2nd, 3rd, 4th, 6th - pupils equal and reactive to light, visual fields full to confrontation, extraocular muscles intact, no nystagmus  5th - facial sensation symmetric  7th - facial strength symmetric  8th - hearing intact  9th - palate elevates symmetrically, uvula midline  11th - shoulder shrug symmetric  12th - tongue protrusion midline  MOTOR:   normal bulk and tone, full strength in the BUE, BLE  SENSORY:   normal and symmetric to light touch, temperature, vibration; DECR SENS IN RIGHT FOOT / LEG  COORDINATION:   finger-nose-finger, fine finger movements normal  REFLEXES:   deep tendon reflexes TRACE and symmetric  GAIT/STATION:   narrow based gait; SLIGHTLY LIMPING ON RIGHT LEG     DIAGNOSTIC DATA (LABS, IMAGING, TESTING) - I reviewed patient records, labs, notes, testing and imaging myself where available.  Lab Results  Component Value Date   WBC 11.9 (H) 09/08/2019   HGB 12.9 09/08/2019   HCT 39.0 09/08/2019   MCV 84.6 09/08/2019   PLT 303 09/08/2019      Component Value Date/Time   NA 138 02/10/2020 1154   NA 138 10/16/2017 1046   K 3.4 (L) 02/10/2020 1154   CL 105 02/10/2020 1154   CO2 25 02/10/2020 1154   GLUCOSE 100 (H) 02/10/2020 1154   GLUCOSE 104 06/06/2011 1142   BUN 11 02/10/2020 1154   BUN 9 10/16/2017 1046   CREATININE 0.72 02/10/2020 1154   CALCIUM 9.0 02/10/2020 1154   PROT 6.8 02/10/2020 1154   ALBUMIN 3.7 10/23/2017 1344   AST 16  02/10/2020 1154   ALT 9 02/10/2020 1154   ALKPHOS 40 10/23/2017 1344   BILITOT 0.3 02/10/2020 1154   GFRNONAA 108 02/10/2020 1154   GFRAA 125 02/10/2020 1154   Lab Results  Component Value Date   CHOL 166 09/08/2019   HDL 48 (L) 09/08/2019   LDLCALC 102 (H) 09/08/2019   TRIG 70 09/08/2019   CHOLHDL 3.5 09/08/2019   No results found for: HGBA1C No results found for: VITAMINB12 Lab Results  Component Value Date   TSH 0.741 06/08/2018     02/11/20 MRI lumbar spine [I reviewed images myself and agree with interpretation. -VRP]  - Central disc protrusion at L5-S1, contacting the thecal sac and S1 root sleeves as they bud from the thecal sac. Nerve compression is not demonstrated. This abnormality could  result in nerve irritation. -  Mild facet osteoarthritis at L4-5 and L5-S1 that could contribute to low back pain.     ASSESSMENT AND PLAN  34 y.o. year old female here with HIV since March 2021, now with numbness and weakness of right leg since June 2021. Also with right lower thoracic numbness, right hip numbness.   Ddx: thoracic / lumbar radiculopathies (? autoimmune, inflamm, post herpetic neuralgia, conus medullaris)  1. Right leg pain   2. Numbness and tingling   3. Acute right-sided thoracic back pain     PLAN:  - check MRI thoracic spine, labs, RLE u/s - may consider EMG/NCS; may consider PT / sports med evaluation  Orders Placed This Encounter  Procedures  . MR THORACIC SPINE W WO CONTRAST  . Vitamin B12  . Hemoglobin A1c  . VAS [place] LOWER EXTREMITY VENOUS (DVT)   Return for pending if symptoms worsen or fail to improve.    Penni Bombard, MD 5/42/7062, 37:62 AM Certified in Neurology, Neurophysiology and Neuroimaging  [facility] [address]  [place], [place] [address] [phone]

## 2020-02-14 ENCOUNTER — Encounter: Payer: Self-pay | Admitting: *Deleted

## 2020-02-14 ENCOUNTER — Telehealth: Payer: Self-pay | Admitting: Diagnostic Neuroimaging

## 2020-02-14 DIAGNOSIS — M79604 Pain in right leg: Secondary | ICD-10-CM

## 2020-02-14 DIAGNOSIS — R200 Anesthesia of skin: Secondary | ICD-10-CM

## 2020-02-14 DIAGNOSIS — R202 Paresthesia of skin: Secondary | ICD-10-CM

## 2020-02-14 LAB — HEMOGLOBIN A1C
Est. average glucose Bld gHb Est-mCnc: 114 mg/dL
Hgb A1c MFr Bld: 5.6 % (ref 4.8–5.6)

## 2020-02-14 LAB — VITAMIN B12: Vitamin B-12: 304 pg/mL (ref 232–1245)

## 2020-02-14 NOTE — Telephone Encounter (Signed)
BCBS pending.

## 2020-02-15 NOTE — Telephone Encounter (Signed)
I called to check the status they did not approve the MRI. You can do a peer to peer. You just call [employer] at [phone] they stated once you call you just tell the agent that you need to do a peer to peer and they contact you with another physician right then. The reason it is not approved is because they want recent results of conservative therapy such as medication and physical therapy. Most recent treatment of emg results.   The tracking number is [id number].

## 2020-02-15 NOTE — Telephone Encounter (Signed)
Ok; recommend trial of PT / OT for 6 weeks; then can consider MRI. -VRP

## 2020-02-15 NOTE — Progress Notes (Signed)
MRI completed on 02/11/2020.  Ozella Almond, [place]

## 2020-02-15 NOTE — Telephone Encounter (Signed)
Spoke with patient and advised her insurance didn;t approve MRI. She must try conservative measures, and Dr Leta Baptist recommends 6 week trial of PT/OT, then may consider MRI. She agreed, orders placed.

## 2020-02-16 ENCOUNTER — Other Ambulatory Visit: Payer: Self-pay | Admitting: Student in an Organized Health Care Education/Training Program

## 2020-02-16 ENCOUNTER — Telehealth: Payer: Self-pay | Admitting: Diagnostic Neuroimaging

## 2020-02-16 MED ORDER — VALACYCLOVIR HCL 500 MG PO TABS: 500.0000 mg | ORAL_TABLET | Freq: Two times a day (BID) | ORAL | 1 refills | Status: AC

## 2020-02-16 NOTE — Telephone Encounter (Signed)
[facility] faxed a request for office notes to be faxed   for review for MRI  Thoracic spine Faxed noted faxed notes to [phone]. Pending conformation on your desk Raquel Sarna. Thanks Hinton Dyer

## 2020-02-16 NOTE — Telephone Encounter (Signed)
Called patient and left her message about venous doppler I have her scheduled at [facility] she needs to arrive at 9:45 for 10:00 go to Entrance C 02/22/2020. [phone] if she needs to reschedule

## 2020-02-16 NOTE — Progress Notes (Signed)
Valtrex tx with 1 refill sent for mild HSV2 outbreak

## 2020-02-20 NOTE — Telephone Encounter (Signed)
Patient insurance denied exam. Patient is aware of the denial reason. There is a phone note from 02/14/20.

## 2020-02-20 NOTE — Telephone Encounter (Signed)
[employer] capital did end up approving her MRI. Auth: [id number] (exp. 02/14/20 to 08/12/20)  I did left a voicemail informing the patient that her insurance has approved the MRI and that I am sending the order to GI. And they will give her a call to schedule. If she has not heard from them I left their number of [phone].

## 2020-02-20 NOTE — Telephone Encounter (Signed)
[employer] capital did end up approving her MRI. Auth: [id number] (exp. 02/14/20 to 08/12/20)  I did left a voicemail informing the patient that her insurance has approved the MRI and that I am sending the order to GI. And they will give her a call to schedule. If she has not heard from them I left their number of [phone].

## 2020-02-22 ENCOUNTER — Ambulatory Visit (HOSPITAL_COMMUNITY)
Admission: RE | Admit: 2020-02-22 | Discharge: 2020-02-22 | Disposition: A | Discharge status: Home / Self Care | Payer: BC Managed Care – PPO | Source: Ambulatory Visit | Attending: Diagnostic Neuroimaging | Admitting: Diagnostic Neuroimaging

## 2020-02-22 ENCOUNTER — Encounter: Payer: Self-pay | Admitting: *Deleted

## 2020-02-22 ENCOUNTER — Other Ambulatory Visit: Payer: Self-pay

## 2020-02-22 DIAGNOSIS — R202 Paresthesia of skin: Secondary | ICD-10-CM

## 2020-02-22 DIAGNOSIS — R200 Anesthesia of skin: Secondary | ICD-10-CM | POA: Insufficient documentation

## 2020-02-22 DIAGNOSIS — M79604 Pain in right leg: Secondary | ICD-10-CM | POA: Diagnosis not present

## 2020-02-22 NOTE — Progress Notes (Signed)
Lower extremity venous has been completed.   Preliminary results in CV Proc.   Abram Sander 02/22/2020 10:26 AM

## 2020-02-25 ENCOUNTER — Other Ambulatory Visit: Payer: Self-pay

## 2020-02-25 ENCOUNTER — Ambulatory Visit
Admission: RE | Admit: 2020-02-25 | Discharge: 2020-02-25 | Disposition: A | Discharge status: Home / Self Care | Payer: BC Managed Care – PPO | Source: Ambulatory Visit | Attending: Diagnostic Neuroimaging | Admitting: Diagnostic Neuroimaging

## 2020-02-25 DIAGNOSIS — R200 Anesthesia of skin: Secondary | ICD-10-CM | POA: Diagnosis not present

## 2020-02-25 DIAGNOSIS — M79604 Pain in right leg: Secondary | ICD-10-CM

## 2020-02-25 MED ORDER — GADOBENATE DIMEGLUMINE 529 MG/ML IV SOLN
15.0000 mL | Freq: Once | INTRAVENOUS | Status: AC | PRN
  Administered 2020-02-25: 15 mL via INTRAVENOUS

## 2020-03-06 ENCOUNTER — Ambulatory Visit: Payer: BC Managed Care – PPO | Admitting: Psychology

## 2020-03-23 ENCOUNTER — Encounter: Payer: Self-pay | Admitting: Student in an Organized Health Care Education/Training Program

## 2020-03-23 DIAGNOSIS — R748 Abnormal levels of other serum enzymes: Secondary | ICD-10-CM

## 2020-03-29 ENCOUNTER — Ambulatory Visit (INDEPENDENT_AMBULATORY_CARE_PROVIDER_SITE_OTHER): Payer: BC Managed Care – PPO | Admitting: Family

## 2020-03-29 ENCOUNTER — Encounter: Payer: Self-pay | Admitting: Family

## 2020-03-29 ENCOUNTER — Other Ambulatory Visit: Payer: Self-pay

## 2020-03-29 VITALS — BP 110/77 | HR 69 | Wt 181.0 lb

## 2020-03-29 DIAGNOSIS — Z0000 Encounter for general adult medical examination without abnormal findings: Secondary | ICD-10-CM | POA: Diagnosis not present

## 2020-03-29 DIAGNOSIS — B20 Human immunodeficiency virus [HIV] disease: Secondary | ICD-10-CM | POA: Diagnosis not present

## 2020-03-29 DIAGNOSIS — Z21 Asymptomatic human immunodeficiency virus [HIV] infection status: Secondary | ICD-10-CM | POA: Diagnosis not present

## 2020-03-29 MED ORDER — BICTEGRAVIR-EMTRICITAB-TENOFOV 50-200-25 MG PO TABS: 1.0000 | ORAL_TABLET | Freq: Every day | ORAL | 5 refills | Status: DC

## 2020-03-29 NOTE — Assessment & Plan Note (Signed)
   Declines vaccinations today.  Discussed importance of safe sexual practice to reduce risk of STI.  Condoms declined.  Encouraged/discussed Covid vaccine.  Due for routine dental care with commercial insurance and encouraged to schedule independently.

## 2020-03-29 NOTE — Assessment & Plan Note (Signed)
Candice [name] continues to have well-controlled HIV disease with good adherence and tolerance to her ART regimen of Biktarvy.  No signs/symptoms of opportunistic infection or progressive HIV disease.  [name] previous lab work and discussed plan of care.  She is still awaiting possible switch to injectable medications which is currently on hold due to administration delays.  We will continue current dose of Biktarvy for now.  Check lab work today.  Plan for follow-up in 3 months or sooner if needed.

## 2020-03-29 NOTE — Progress Notes (Signed)
Subjective:    Patient ID: Candice [name], female    DOB: 13-Aug-1985, 34 y.o.   MRN: [id number]  Chief Complaint  Patient presents with  . Follow-up    Mycostatin rinse did not help clear up thrush according to patient.     HPI:  Candice [name] is a 34 y.o. female with HIV disease who was last seen in the office on 02/10/2020 with good adherence and tolerance to her ART regimen of Biktarvy.  Blood work at the time showed a viral load that was undetectable and CD4 count of 1233.  Here today for routine follow-up.  Candice [name] continues to take her Biktarvy daily as prescribed with no adverse side effects or missed doses since her last office visit.  Overall feeling well today although continues to have problems with her right side described as neuropathic and occasional sharpness.  She is following up with neurology as well as primary care.  Candice [name] has no problems obtaining her medication from the pharmacy.  Denies feelings of being down, depressed, or hopeless recently.  No recreational or illicit drug use, tobacco use, or alcohol consumption.  She has been sexually active for the first time since she was diagnosed.  She is due for routine dental care and has Pharmacist, community.  Declines vaccinations today.  No Known Allergies    Outpatient Medications Prior to Visit  Medication Sig Dispense Refill  . bictegravir-emtricitabine-tenofovir AF (BIKTARVY) 50-200-25 MG TABS tablet Take 1 tablet by mouth daily. 30 tablet 2  . cyclobenzaprine (FLEXERIL) 10 MG tablet TAKE 1 TABLET BY MOUTH TWICE A DAY AS NEEDED FOR MUSCLE SPASMS (Patient not taking: Reported on 03/29/2020) 45 tablet 0  . mupirocin ointment (BACTROBAN) 2 % PLACE 1 APPLICATION INTO THE NOSE 2 (TWO) TIMES DAILY. FOR 7 DAYS. (Patient not taking: Reported on 03/29/2020) 22 g 1  . nystatin (MYCOSTATIN) 100000 UNIT/ML suspension Swish 5 ml of solution in mouth for several minutes then spit or swallow four times daily.  (Patient not taking: Reported on 03/29/2020) 120 mL 0   No facility-administered medications prior to visit.     Past Medical History:  Diagnosis Date  . Complication of anesthesia    epidural with last pregnancy made entire left side numb, had to d/c  . Depression   . Headache   . HSV 06/28/2007  . Hx of migraines      Past Surgical History:  Procedure Laterality Date  . BREAST SURGERY  2019   reduction  . CESAREAN SECTION    . CESAREAN SECTION  09/07/2011   Procedure: CESAREAN SECTION;  Surgeon: Florian Buff, MD;  Location: [facility];  Service: Gynecology;  Laterality: N/A;  Repeat cesarean section with delivery of baby boy at 8. Apgars 9/9.  Bilateral tubal ligation.  . IUD REMOVAL  2005   in cervix  . TUBAL LIGATION    . tubal reanastomosis  08/13/2016       Review of Systems  Constitutional: Negative for appetite change, chills, diaphoresis, fatigue, fever and unexpected weight change.  Eyes:       Negative for acute change in vision  Respiratory: Negative for chest tightness, shortness of breath and wheezing.   Cardiovascular: Negative for chest pain.  Gastrointestinal: Negative for diarrhea, nausea and vomiting.  Genitourinary: Negative for dysuria, pelvic pain and vaginal discharge.  Musculoskeletal: Negative for neck pain and neck stiffness.  Skin: Negative for rash.  Neurological: Negative for seizures, syncope, weakness and headaches.  Hematological:  Negative for adenopathy. Does not bruise/bleed easily.  Psychiatric/Behavioral: Negative for hallucinations.      Objective:    BP 110/77   Pulse 69   Wt 181 lb (82.1 kg)   SpO2 100%   BMI 32.06 kg/m  Nursing note and vital signs reviewed.  Physical Exam Constitutional:      General: She is not in acute distress.    Appearance: She is well-developed.  Eyes:     Conjunctiva/sclera: Conjunctivae normal.  Cardiovascular:     Rate and Rhythm: Normal rate and regular rhythm.     Heart sounds: Normal  heart sounds. No murmur heard.  No friction rub. No gallop.   Pulmonary:     Effort: Pulmonary effort is normal. No respiratory distress.     Breath sounds: Normal breath sounds. No wheezing or rales.  Chest:     Chest wall: No tenderness.  Abdominal:     General: Bowel sounds are normal.     Palpations: Abdomen is soft.     Tenderness: There is no abdominal tenderness.  Musculoskeletal:     Cervical back: Neck supple.  Lymphadenopathy:     Cervical: No cervical adenopathy.  Skin:    General: Skin is warm and dry.     Findings: No rash.  Neurological:     Mental Status: She is alert and oriented to person, place, and time.  Psychiatric:        Behavior: Behavior normal.        Thought Content: Thought content normal.        Judgment: Judgment normal.      Depression screen [facility] 2/9 02/10/2020 02/07/2020 02/01/2020 12/09/2019 09/05/2019  Decreased Interest 0 0 0 0 0  Down, Depressed, Hopeless 1 0 0 0 0  PHQ - 2 Score 1 0 0 0 0  Altered sleeping 0 1 1 - -  Tired, decreased energy 1 1 0 - -  Change in appetite 0 1 1 - -  Feeling bad or failure about yourself  0 0 0 - -  Trouble concentrating 1 0 0 - -  Moving slowly or fidgety/restless 0 0 0 - -  Suicidal thoughts 0 0 0 - -  PHQ-9 Score 3 3 2  - -       Assessment & Plan:    Patient Active Problem List   Diagnosis Date Noted  . Healthcare maintenance 02/10/2020  . Thrush, oral 02/10/2020  . Right leg numbness 02/07/2020  . Sleep disturbance 11/08/2019  . HIV (human immunodeficiency virus infection) ([place]) 09/07/2019  . Hemorrhagic cyst of left ovary 10/14/2016  . MRSA colonization 09/25/2014  . Major depressive disorder, recurrent episode, moderate ([place]) 06/08/2013  . Genital herpes 02/16/2013  . Back pain 10/13/2011  . TOBACCO USER 03/31/2009  . OBESITY, NOS 08/20/2006     Problem List Items Addressed This Visit      Other   HIV (human immunodeficiency virus infection) ([place]) - Primary    Candice [name] continues  to have well-controlled HIV disease with good adherence and tolerance to her ART regimen of Biktarvy.  No signs/symptoms of opportunistic infection or progressive HIV disease.  [name] previous lab work and discussed plan of care.  She is still awaiting possible switch to injectable medications which is currently on hold due to administration delays.  We will continue current dose of Biktarvy for now.  Check lab work today.  Plan for follow-up in 3 months or sooner if needed.  Relevant Medications   bictegravir-emtricitabine-tenofovir AF (BIKTARVY) 50-200-25 MG TABS tablet   Other Relevant Orders   HIV-1 RNA quant-no reflex-bld   T-helper cell (CD4)- ([facility] clinic only)   COMPLETE METABOLIC PANEL WITH GFR   Healthcare maintenance     Declines vaccinations today.  Discussed importance of safe sexual practice to reduce risk of STI.  Condoms declined.  Encouraged/discussed Covid vaccine.  Due for routine dental care with commercial insurance and encouraged to schedule independently.       Other Visit Diagnoses    Asymptomatic HIV infection ([place])       Relevant Medications   bictegravir-emtricitabine-tenofovir AF (BIKTARVY) 50-200-25 MG TABS tablet       I am having Candice [name] maintain her mupirocin ointment, cyclobenzaprine, nystatin, and bictegravir-emtricitabine-tenofovir AF.   Meds ordered this encounter  Medications  . bictegravir-emtricitabine-tenofovir AF (BIKTARVY) 50-200-25 MG TABS tablet    Sig: Take 1 tablet by mouth daily.    Dispense:  30 tablet    Refill:  5    Order Specific Question:   Supervising Provider    Answer:   Carlyle Basques [[id number]]     Follow-up: Return in about 3 months (around 06/29/2020), or if symptoms worsen or fail to improve.   Terri Piedra, MSN, FNP-C Nurse Practitioner [facility] [place] number: [phone]

## 2020-03-29 NOTE — Patient Instructions (Addendum)
Nice to see you.  Continue to take your Biktarvy daily  Refills will be sent to the pharmacy.   We will keep you posted on [place].   Plan for follow up in 3 months or sooner if needed.  Have a great day and stay safe!

## 2020-03-30 ENCOUNTER — Ambulatory Visit: Payer: BC Managed Care – PPO | Admitting: Family

## 2020-03-30 LAB — T-HELPER CELL (CD4) - (RCID CLINIC ONLY)
CD4 % Helper T Cell: 51 % (ref 33–65)
CD4 T Cell Abs: 1078 /uL (ref 400–1790)

## 2020-04-01 LAB — COMPLETE METABOLIC PANEL WITH GFR
AG Ratio: 1.6 (calc) (ref 1.0–2.5)
ALT: 10 U/L (ref 6–29)
AST: 12 U/L (ref 10–30)
Albumin: 4.2 g/dL (ref 3.6–5.1)
Alkaline phosphatase (APISO): 48 U/L (ref 31–125)
BUN: 11 mg/dL (ref 7–25)
CO2: 26 mmol/L (ref 20–32)
Calcium: 9.5 mg/dL (ref 8.6–10.2)
Chloride: 104 mmol/L (ref 98–110)
Creat: 0.72 mg/dL (ref 0.50–1.10)
GFR, Est African American: 125 mL/min/{1.73_m2} (ref >=60)
GFR, Est Non African American: 108 mL/min/{1.73_m2} (ref >=60)
Globulin: 2.7 g/dL (calc) (ref 1.9–3.7)
Glucose, Bld: 90 mg/dL (ref 65–99)
Potassium: 4 mmol/L (ref 3.5–5.3)
Sodium: 139 mmol/L (ref 135–146)
Total Bilirubin: 0.4 mg/dL (ref 0.2–1.2)
Total Protein: 6.9 g/dL (ref 6.1–8.1)

## 2020-04-01 LAB — HIV-1 RNA QUANT-NO REFLEX-BLD
HIV 1 RNA Quant: <20 Copies/mL
HIV-1 RNA Quant, Log: <1.3 Log cps/mL

## 2020-04-02 ENCOUNTER — Other Ambulatory Visit: Payer: Self-pay

## 2020-04-02 ENCOUNTER — Ambulatory Visit (INDEPENDENT_AMBULATORY_CARE_PROVIDER_SITE_OTHER): Payer: BC Managed Care – PPO | Admitting: Student in an Organized Health Care Education/Training Program

## 2020-04-02 ENCOUNTER — Encounter: Payer: Self-pay | Admitting: Student in an Organized Health Care Education/Training Program

## 2020-04-02 VITALS — BP 112/70 | HR 73 | Ht 63.0 in | Wt 179.8 lb

## 2020-04-02 DIAGNOSIS — R937 Abnormal findings on diagnostic imaging of other parts of musculoskeletal system: Secondary | ICD-10-CM

## 2020-04-02 DIAGNOSIS — R1011 Right upper quadrant pain: Secondary | ICD-10-CM

## 2020-04-02 DIAGNOSIS — R109 Unspecified abdominal pain: Secondary | ICD-10-CM | POA: Diagnosis not present

## 2020-04-02 DIAGNOSIS — G379 Demyelinating disease of central nervous system, unspecified: Secondary | ICD-10-CM | POA: Diagnosis not present

## 2020-04-02 DIAGNOSIS — R748 Abnormal levels of other serum enzymes: Secondary | ICD-10-CM

## 2020-04-02 DIAGNOSIS — Z820 Family history of epilepsy and other diseases of the nervous system: Secondary | ICD-10-CM

## 2020-04-02 DIAGNOSIS — M7741 Metatarsalgia, right foot: Secondary | ICD-10-CM

## 2020-04-02 MED ORDER — SENNA 8.6 MG PO TABS: 1.0000 | ORAL_TABLET | Freq: Every day | ORAL | 0 refills | Status: AC

## 2020-04-02 MED ORDER — OMEPRAZOLE 20 MG PO CPDR: 20.0000 mg | DELAYED_RELEASE_CAPSULE | Freq: Every day | ORAL | 2 refills | Status: DC

## 2020-04-02 NOTE — Progress Notes (Signed)
° °  SUBJECTIVE:   CHIEF COMPLAINT / HPI:   R sided abdominal pain- Started ~3 weeks ago after having sex for first time since HIV diagnosis. With partner who gave her HIV. Used a condom. Anal and vaginal.  Ovulating last week. Sept 28 was last period.  Urinating normally. Some frequency but no increased intake, neg dysuria.  Constipation- last BM yesterday was hard pellets, starined. Happens about every 2 days. Feeling bloating. Not taking anything for that.  Nauseas, no vomiting.  + Gas, belching. H/o GERD. No medication.  Worse at night and when active. And when lays down to go to bed.   Bilateral feet pain- right foot- numbness and tingling, chronic.  Pain in metatarsal area when on feet for long times at work and wearing her tighter shoes.   F/u MRI- patient saw neurology and has not heard of thoracic MR results yet. Sister has h/o MS  OBJECTIVE:   BP 112/70    Pulse 73    Ht 5\' 3"  (1.6 m)    Wt 179 lb 12.8 oz (81.6 kg)    SpO2 98%    BMI 31.85 kg/m   General: NAD, pleasant, able to participate in exam Abdomen: soft, tender diffusely on R side and worse at epigastric area, no tenderness to left side of abdomen. nondistended, no hepatic or splenomegaly, +BS Extremities: no edema. WWP. Tenderness to palpation of plantar surface mid forefoot. Good sensation, ROM, perfusion, strength. No edema or redness. Skin: warm and dry, no rashes noted Neuro: alert and oriented, no focal deficits Psych: Normal affect and mood  ASSESSMENT/PLAN:   Right-sided abdominal pain of unknown etiology Most likely multifactorial and discussed with patient.  Most likely contributions of constipation, GERD, anxiety-related from first sexual encounter after HIV diagnosis and having rough intercourse. Pain has improved.  - will treat constipation with senna and fiber supplement. Consider suppository/enema if not improving.  - also treating GERD with lifestyle changes and omeprazole - provided patient  with reassurance for being well controlled HIV - if not improved, consider imaging or GI referral.  - also to be discussed more separately, but could be related to possible MS - ordering CMP and lipase  Update 10/15: patient having BM and not extremely uncomfortable but straining and would like further constipation treatment so prescribing suppository. Patient will come in for repeat lipase on Monday  Abnormal MRI, spine T8-T9 abnormality on thoracic MRI which could represent demyelination. Radiologist recommended consider brain MRI to evaluate for demyelination elsewhere. Discussed findings and possible implications with patient. Could help explain some of her neuropathy symptoms.  Patient's sister has MS.  - ordered brain MRI - follow up with neurology  Metatarsalgia of right foot Patient has pain consistent with metatarsalgia and separate from her peripheral neuropathic symptoms. Worsened when on her feet prolonged periods of time.  Recommended and given metatarsal pad and instructions on use.  - follow up if not improving in 4-6 weeks     Richarda Osmond, [place]

## 2020-04-02 NOTE — Patient Instructions (Addendum)
It was a pleasure to see you today!  To summarize our discussion for this visit:  FMLA paperwork- from your work and sets parameters for work restrictions. Bring to the clinic and drop off.  Metatarsal pads- put in right shoe as the picture shoes. Also, I recommend you wear shoes with good padding and support as much as possible. I think it will help your symptoms to keep your toe nails shortened further.  GERD- I'm prescribing omeprazole to help treat your GERD symptoms. Other things that can help significantly are modifying your diet and weight loss. Theres more info below.  Constipation- take metamucil 1-2 times every day. Senna for 1 week. Also, drink lots of water. Message me if not having any improvement and we can get more aggressive with suppositories or enemas.   Brain MRI- I'm going to put in the order so be on the lookout for scheduling information.   Labs- we are going to check out your pancreas and liver function today to be thorough about your abdominal pain. I'll message you with any abnormal results.  This was a lot to cover in 1 appointment so if you want to discuss anything more thoroughly, please come back for another appointment or message me on mychart with questions.   Some additional health maintenance measures we should update are: Health Maintenance Due  Topic Date Due  . COVID-19 Vaccine (1) Never done  . INFLUENZA VACCINE  01/22/2020  .    Call the clinic at [phone] if your symptoms worsen or you have any concerns.   Thank you for allowing me to take part in your care,  Dr. Doristine Mango   Gastroesophageal Reflux Disease, Adult Gastroesophageal reflux (GER) happens when acid from the stomach flows up into the tube that connects the mouth and the stomach (esophagus). Normally, food travels down the esophagus and stays in the stomach to be digested. With GER, food and stomach acid sometimes move back up into the esophagus. You may have a disease  called gastroesophageal reflux disease (GERD) if the reflux:  Happens often.  Causes frequent or very bad symptoms.  Causes problems such as damage to the esophagus. When this happens, the esophagus becomes sore and swollen (inflamed). Over time, GERD can make small holes (ulcers) in the lining of the esophagus. What are the causes? This condition is caused by a problem with the muscle between the esophagus and the stomach. When this muscle is weak or not normal, it does not close properly to keep food and acid from coming back up from the stomach. The muscle can be weak because of:  Tobacco use.  Pregnancy.  Having a certain type of hernia (hiatal hernia).  Alcohol use.  Certain foods and drinks, such as coffee, chocolate, onions, and peppermint. What increases the risk? You are more likely to develop this condition if you:  Are overweight.  Have a disease that affects your connective tissue.  Use NSAID medicines. What are the signs or symptoms? Symptoms of this condition include:  Heartburn.  Difficult or painful swallowing.  The feeling of having a lump in the throat.  A bitter taste in the mouth.  Bad breath.  Having a lot of saliva.  Having an upset or bloated stomach.  Belching.  Chest pain. Different conditions can cause chest pain. Make sure you see your doctor if you have chest pain.  Shortness of breath or noisy breathing (wheezing).  Ongoing (chronic) cough or a cough at night.  Wearing away  of the surface of teeth (tooth enamel).  Weight loss. How is this treated? Treatment will depend on how bad your symptoms are. Your doctor may suggest:  Changes to your diet.  Medicine.  Surgery. Follow these instructions at home: Eating and drinking   Follow a diet as told by your doctor. You may need to avoid foods and drinks such as: ? Coffee and tea (with or without caffeine). ? Drinks that contain alcohol. ? Energy drinks and sports  drinks. ? Bubbly (carbonated) drinks or sodas. ? Chocolate and cocoa. ? Peppermint and mint flavorings. ? Garlic and onions. ? Horseradish. ? Spicy and acidic foods. These include peppers, chili powder, curry powder, vinegar, hot sauces, and BBQ sauce. ? Citrus fruit juices and citrus fruits, such as oranges, lemons, and limes. ? Tomato-based foods. These include red sauce, chili, salsa, and pizza with red sauce. ? Fried and fatty foods. These include donuts, french fries, potato chips, and high-fat dressings. ? High-fat meats. These include hot dogs, rib eye steak, sausage, ham, and bacon. ? High-fat dairy items, such as whole milk, butter, and cream cheese.  Eat small meals often. Avoid eating large meals.  Avoid drinking large amounts of liquid with your meals.  Avoid eating meals during the 2-3 hours before bedtime.  Avoid lying down right after you eat.  Do not exercise right after you eat. Lifestyle   Do not use any products that contain nicotine or tobacco. These include cigarettes, e-cigarettes, and chewing tobacco. If you need help quitting, ask your doctor.  Try to lower your stress. If you need help doing this, ask your doctor.  If you are overweight, lose an amount of weight that is healthy for you. Ask your doctor about a safe weight loss goal. General instructions  Pay attention to any changes in your symptoms.  Take over-the-counter and prescription medicines only as told by your doctor. Do not take aspirin, ibuprofen, or other NSAIDs unless your doctor says it is okay.  Wear loose clothes. Do not wear anything tight around your waist.  Raise (elevate) the head of your bed about 6 inches (15 cm).  Avoid bending over if this makes your symptoms worse.  Keep all follow-up visits as told by your doctor. This is important. Contact a doctor if:  You have new symptoms.  You lose weight and you do not know why.  You have trouble swallowing or it hurts to  swallow.  You have wheezing or a cough that keeps happening.  Your symptoms do not get better with treatment.  You have a hoarse voice. Get help right away if:  You have pain in your arms, neck, jaw, teeth, or back.  You feel sweaty, dizzy, or light-headed.  You have chest pain or shortness of breath.  You throw up (vomit) and your throw-up looks like blood or coffee grounds.  You pass out (faint).  Your poop (stool) is bloody or black.  You cannot swallow, drink, or eat. Summary  If a person has gastroesophageal reflux disease (GERD), food and stomach acid move back up into the esophagus and cause symptoms or problems such as damage to the esophagus.  Treatment will depend on how bad your symptoms are.  Follow a diet as told by your doctor.  Take all medicines only as told by your doctor. This information is not intended to replace advice given to you by your health care provider. Make sure you discuss any questions you have with your health care provider.  Document Revised: 12/16/2017 Document Reviewed: 12/16/2017 Elsevier Patient Education  [employer].

## 2020-04-03 LAB — COMPREHENSIVE METABOLIC PANEL
ALT: 11 IU/L (ref 0–32)
AST: 13 IU/L (ref 0–40)
Albumin/Globulin Ratio: 1.6 (ref 1.2–2.2)
Albumin: 4.5 g/dL (ref 3.8–4.8)
Alkaline Phosphatase: 55 IU/L (ref 44–121)
BUN/Creatinine Ratio: 13 (ref 9–23)
BUN: 10 mg/dL (ref 6–20)
Bilirubin Total: 0.2 mg/dL (ref 0.0–1.2)
CO2: 24 mmol/L (ref 20–29)
Calcium: 9.2 mg/dL (ref 8.7–10.2)
Chloride: 106 mmol/L (ref 96–106)
Creatinine, Ser: 0.75 mg/dL (ref 0.57–1.00)
GFR calc Af Amer: 119 mL/min/{1.73_m2} (ref >59)
GFR calc non Af Amer: 103 mL/min/{1.73_m2} (ref >59)
Globulin, Total: 2.8 g/dL (ref 1.5–4.5)
Glucose: 82 mg/dL (ref 65–99)
Potassium: 3.8 mmol/L (ref 3.5–5.2)
Sodium: 141 mmol/L (ref 134–144)
Total Protein: 7.3 g/dL (ref 6.0–8.5)

## 2020-04-03 LAB — LIPASE: Lipase: 75 U/L — ABNORMAL HIGH (ref 14–72)

## 2020-04-06 DIAGNOSIS — M7741 Metatarsalgia, right foot: Secondary | ICD-10-CM | POA: Insufficient documentation

## 2020-04-06 DIAGNOSIS — R937 Abnormal findings on diagnostic imaging of other parts of musculoskeletal system: Secondary | ICD-10-CM | POA: Insufficient documentation

## 2020-04-06 DIAGNOSIS — R109 Unspecified abdominal pain: Secondary | ICD-10-CM | POA: Insufficient documentation

## 2020-04-06 HISTORY — DX: Abnormal findings on diagnostic imaging of other parts of musculoskeletal system: R93.7

## 2020-04-06 MED ORDER — GLYCERIN (ADULT) 2 G RE SUPP: 1.0000 | RECTAL | 0 refills | Status: DC | PRN

## 2020-04-06 NOTE — Assessment & Plan Note (Addendum)
T8-T9 abnormality on thoracic MRI which could represent demyelination. Radiologist recommended consider brain MRI to evaluate for demyelination elsewhere. Discussed findings and possible implications with patient. Could help explain some of her neuropathy symptoms.  Patient's sister has MS.  - ordered brain MRI - follow up with neurology

## 2020-04-06 NOTE — Assessment & Plan Note (Signed)
Patient has pain consistent with metatarsalgia and separate from her peripheral neuropathic symptoms. Worsened when on her feet prolonged periods of time.  Recommended and given metatarsal pad and instructions on use.  - follow up if not improving in 4-6 weeks

## 2020-04-06 NOTE — Assessment & Plan Note (Addendum)
Most likely multifactorial and discussed with patient.  Most likely contributions of constipation, GERD, anxiety-related from first sexual encounter after HIV diagnosis and having rough intercourse. Pain has improved.  - will treat constipation with senna and fiber supplement. Consider suppository/enema if not improving.  - also treating GERD with lifestyle changes and omeprazole - provided patient with reassurance for being well controlled HIV - if not improved, consider imaging or GI referral.  - also to be discussed more separately, but could be related to possible MS - ordering CMP and lipase  Update 10/15: patient having BM and not extremely uncomfortable but straining and would like further constipation treatment so prescribing suppository. Patient will come in for repeat lipase on Monday

## 2020-04-09 ENCOUNTER — Other Ambulatory Visit: Payer: Self-pay

## 2020-04-09 ENCOUNTER — Other Ambulatory Visit: Payer: BC Managed Care – PPO

## 2020-04-09 DIAGNOSIS — R748 Abnormal levels of other serum enzymes: Secondary | ICD-10-CM

## 2020-04-10 LAB — LIPASE: Lipase: 85 U/L — ABNORMAL HIGH (ref 14–72)

## 2020-04-11 ENCOUNTER — Encounter (HOSPITAL_COMMUNITY): Payer: Self-pay

## 2020-04-11 ENCOUNTER — Telehealth: Payer: Self-pay

## 2020-04-11 ENCOUNTER — Emergency Department (HOSPITAL_COMMUNITY)
Admission: EM | Admit: 2020-04-11 | Discharge: 2020-04-12 | Disposition: A | Discharge status: Home / Self Care | Payer: BC Managed Care – PPO | Attending: Emergency Medicine | Admitting: Emergency Medicine

## 2020-04-11 ENCOUNTER — Other Ambulatory Visit: Payer: Self-pay

## 2020-04-11 ENCOUNTER — Emergency Department (HOSPITAL_COMMUNITY): Payer: BC Managed Care – PPO

## 2020-04-11 DIAGNOSIS — F17210 Nicotine dependence, cigarettes, uncomplicated: Secondary | ICD-10-CM | POA: Diagnosis not present

## 2020-04-11 DIAGNOSIS — R1084 Generalized abdominal pain: Secondary | ICD-10-CM | POA: Insufficient documentation

## 2020-04-11 DIAGNOSIS — Z21 Asymptomatic human immunodeficiency virus [HIV] infection status: Secondary | ICD-10-CM | POA: Insufficient documentation

## 2020-04-11 DIAGNOSIS — R001 Bradycardia, unspecified: Secondary | ICD-10-CM | POA: Diagnosis not present

## 2020-04-11 DIAGNOSIS — R109 Unspecified abdominal pain: Secondary | ICD-10-CM | POA: Diagnosis not present

## 2020-04-11 LAB — CBC
HCT: 38.2 % (ref 36.0–46.0)
Hemoglobin: 12.2 g/dL (ref 12.0–15.0)
MCH: 28.2 pg (ref 26.0–34.0)
MCHC: 31.9 g/dL (ref 30.0–36.0)
MCV: 88.4 fL (ref 80.0–100.0)
Platelets: 310 10*3/uL (ref 150–400)
RBC: 4.32 MIL/uL (ref 3.87–5.11)
RDW: 15.4 % (ref 11.5–15.5)
WBC: 13.3 10*3/uL — ABNORMAL HIGH (ref 4.0–10.5)
nRBC: 0 % (ref 0.0–0.2)

## 2020-04-11 LAB — COMPREHENSIVE METABOLIC PANEL
ALT: 11 U/L (ref 0–44)
AST: 13 U/L — ABNORMAL LOW (ref 15–41)
Albumin: 3.7 g/dL (ref 3.5–5.0)
Alkaline Phosphatase: 44 U/L (ref 38–126)
Anion gap: 10 (ref 5–15)
BUN: 10 mg/dL (ref 6–20)
CO2: 22 mmol/L (ref 22–32)
Calcium: 9.3 mg/dL (ref 8.9–10.3)
Chloride: 107 mmol/L (ref 98–111)
Creatinine, Ser: 0.76 mg/dL (ref 0.44–1.00)
GFR, Estimated: >60 mL/min (ref >60)
Glucose, Bld: 95 mg/dL (ref 70–99)
Potassium: 3.8 mmol/L (ref 3.5–5.1)
Sodium: 139 mmol/L (ref 135–145)
Total Bilirubin: 0.4 mg/dL (ref 0.3–1.2)
Total Protein: 6.9 g/dL (ref 6.5–8.1)

## 2020-04-11 LAB — URINALYSIS, ROUTINE W REFLEX MICROSCOPIC
Bilirubin Urine: NEGATIVE
Glucose, UA: NEGATIVE mg/dL
Hgb urine dipstick: NEGATIVE
Ketones, ur: NEGATIVE mg/dL
Leukocytes,Ua: NEGATIVE
Nitrite: NEGATIVE
Protein, ur: NEGATIVE mg/dL
Specific Gravity, Urine: 1.011 (ref 1.005–1.030)
pH: 6 (ref 5.0–8.0)

## 2020-04-11 LAB — LIPASE, BLOOD: Lipase: 76 U/L — ABNORMAL HIGH (ref 11–51)

## 2020-04-11 MED ORDER — SODIUM CHLORIDE 0.9 % IV BOLUS
1000.0000 mL | Freq: Once | INTRAVENOUS | Status: AC
  Administered 2020-04-11: 1000 mL via INTRAVENOUS

## 2020-04-11 MED ORDER — IOHEXOL 300 MG/ML  SOLN
100.0000 mL | Freq: Once | INTRAMUSCULAR | Status: AC | PRN
  Administered 2020-04-11: 100 mL via INTRAVENOUS

## 2020-04-11 MED ORDER — MORPHINE SULFATE (PF) 4 MG/ML IV SOLN
4.0000 mg | Freq: Once | INTRAVENOUS | Status: AC
  Administered 2020-04-11: 4 mg via INTRAVENOUS
  Filled 2020-04-11: qty 1

## 2020-04-11 MED ORDER — ONDANSETRON HCL 4 MG/2ML IJ SOLN
4.0000 mg | Freq: Once | INTRAMUSCULAR | Status: AC
  Administered 2020-04-11: 4 mg via INTRAVENOUS
  Filled 2020-04-11: qty 2

## 2020-04-11 NOTE — ED Provider Notes (Signed)
[facility] EMERGENCY DEPARTMENT Provider Note   CSN: [id number] Arrival date & time: 04/11/20  1625     History Chief Complaint  Patient presents with  . Abdominal Pain    Candice [name] is a 34 y.o. female with a history of tobacco abuse, HIV (viral load undetectable & CD4 1,078), depression, prior tubal ligation & C-section who presents to the ED with complaints of abdominal pain x 2-3 weeks. Patient states pain is primarily in the epigastrium, does go to her back and her generalized abdomen at times. Discomfort is constant. Having some issues with associated nausea & constipation. Saw PCP- started her on metamucil & senna per PCP which started to move her bowels and alleviated the pain some making it more tolerable. Otherwise no significant alleviating/aggravaitng factors. Denies fever, emesis, melena, hematochezia, diarrhea, dysuria, vaginal bleeding, or vaginal discharge.  She was called by her primary care provider and told that her lipase was elevated and that she needed to come to the emergency department for a CT scan.  She denies alcohol use.  Denies new medications other than what is mentioned above.   HPI     Past Medical History:  Diagnosis Date  . Complication of anesthesia    epidural with last pregnancy made entire left side numb, had to d/c  . Depression   . Headache   . HSV 06/28/2007  . Hx of migraines     Patient Active Problem List   Diagnosis Date Noted  . Right-sided abdominal pain of unknown etiology 04/06/2020  . Abnormal MRI, spine 04/06/2020  . Metatarsalgia of right foot 04/06/2020  . Right leg numbness 02/07/2020  . HIV (human immunodeficiency virus infection) ([place]) 09/07/2019  . Hemorrhagic cyst of left ovary 10/14/2016  . MRSA colonization 09/25/2014  . Major depressive disorder, recurrent episode, moderate ([place]) 06/08/2013  . Genital herpes 02/16/2013  . OBESITY, NOS 08/20/2006    Past Surgical History:  Procedure  Laterality Date  . BREAST SURGERY  2019   reduction  . CESAREAN SECTION    . CESAREAN SECTION  09/07/2011   Procedure: CESAREAN SECTION;  Surgeon: Florian Buff, MD;  Location: [facility];  Service: Gynecology;  Laterality: N/A;  Repeat cesarean section with delivery of baby boy at 28. Apgars 9/9.  Bilateral tubal ligation.  . IUD REMOVAL  2005   in cervix  . TUBAL LIGATION    . tubal reanastomosis  08/13/2016     OB History    Gravida  2   Para  2   Term  2   Preterm  0   AB  0   Living  2     SAB  0   TAB  0   Ectopic  0   Multiple  0   Live Births  2           Family History  Problem Relation Age of Onset  . Alcohol abuse Mother   . Drug abuse Mother   . Depression Mother   . Alcohol abuse Father   . Drug abuse Father   . Mental illness Sister        bipolar ptsd  . Schizophrenia Sister   . Diabetes Maternal Grandmother   . Hypertension Maternal Grandmother   . Cancer Maternal Grandmother        ovarian, breast and lung  . Depression Maternal Grandmother     Social History   Tobacco Use  . Smoking status: Current Every Day  Smoker    Packs/day: 0.25    Types: Cigarettes  . Smokeless tobacco: Never Used  Vaping Use  . Vaping Use: Never used  Substance Use Topics  . Alcohol use: Not Currently    Comment: occasional  . Drug use: Yes    Frequency: 3.0 times per week    Types: Marijuana    Home Medications Prior to Admission medications   Medication Sig Start Date End Date Taking? Authorizing Provider  bictegravir-emtricitabine-tenofovir AF (BIKTARVY) 50-200-25 MG TABS tablet Take 1 tablet by mouth daily. 03/29/20   Golden Circle, FNP  cyclobenzaprine (FLEXERIL) 10 MG tablet TAKE 1 TABLET BY MOUTH TWICE A DAY AS NEEDED FOR MUSCLE SPASMS Patient not taking: Reported on 03/29/2020 09/05/19   Doristine Mango L, DO  glycerin adult 2 g suppository Place 1 suppository rectally as needed for constipation. 04/06/20   Anderson, Chelsey L, DO    mupirocin ointment (BACTROBAN) 2 % PLACE 1 APPLICATION INTO THE NOSE 2 (TWO) TIMES DAILY. FOR 7 DAYS. Patient not taking: Reported on 03/29/2020 08/24/19   Doristine Mango L, DO  nystatin (MYCOSTATIN) 100000 UNIT/ML suspension Swish 5 ml of solution in mouth for several minutes then spit or swallow four times daily. Patient not taking: Reported on 03/29/2020 02/10/20   Golden Circle, FNP  omeprazole (PRILOSEC) 20 MG capsule Take 1 capsule (20 mg total) by mouth daily. 04/02/20   Anderson, Chelsey L, DO  senna (SENOKOT) 8.6 MG TABS tablet Take 1 tablet (8.6 mg total) by mouth daily. 04/02/20 05/02/20  Doristine Mango L, DO    Allergies    Contrast media [iodinated diagnostic agents]  Review of Systems   Review of Systems  Constitutional: Negative for chills and fever.  Respiratory: Negative for shortness of breath.   Gastrointestinal: Positive for abdominal pain, constipation and nausea. Negative for anal bleeding, blood in stool, diarrhea and vomiting.  Genitourinary: Negative for dysuria, vaginal bleeding and vaginal discharge.  Neurological: Negative for syncope.  All other systems reviewed and are negative.   Physical Exam Updated Vital Signs BP 119/73   Pulse (!) 133   Temp 98 F (36.7 C)   Resp 20   Ht 5\' 3"  (1.6 m)   Wt 81.2 kg   SpO2 100%   BMI 31.71 kg/m   Physical Exam Vitals and nursing note reviewed.  Constitutional:      General: She is not in acute distress.    Appearance: She is well-developed. She is not toxic-appearing.  HENT:     Head: Normocephalic and atraumatic.  Eyes:     General:        Right eye: No discharge.        Left eye: No discharge.     Conjunctiva/sclera: Conjunctivae normal.  Cardiovascular:     Rate and Rhythm: Normal rate and regular rhythm.  Pulmonary:     Effort: Pulmonary effort is normal. No respiratory distress.     Breath sounds: Normal breath sounds. No wheezing, rhonchi or rales.  Abdominal:     General: There is no  distension.     Palpations: Abdomen is soft.     Tenderness: There is generalized abdominal tenderness (most prominent in the epigastric area).  Musculoskeletal:     Cervical back: Neck supple.  Skin:    General: Skin is warm and dry.     Findings: No rash.  Neurological:     Mental Status: She is alert.     Comments: Clear speech.   Psychiatric:  Behavior: Behavior normal.    ED Results / Procedures / Treatments   Labs (all labs ordered are listed, but only abnormal results are displayed) Labs Reviewed  LIPASE, BLOOD - Abnormal; Notable for the following components:      Result Value   Lipase 76 (*)    All other components within normal limits  COMPREHENSIVE METABOLIC PANEL - Abnormal; Notable for the following components:   AST 13 (*)    All other components within normal limits  CBC - Abnormal; Notable for the following components:   WBC 13.3 (*)    All other components within normal limits  URINALYSIS, ROUTINE W REFLEX MICROSCOPIC  I-STAT BETA HCG BLOOD, ED ([facility], [facility], [facility] ONLY)    EKG None EKG Interpretation  Date/Time: 04/11/20 @ 23:46   Ventricular Rate: 51 bpm   PR Interval: 180 ms   QRS Duration: 76 ms   QT Interval: 428 ms   QTC Calculation: 394   R Axis:  Normal   Text Interpretation: Sinus bradycardia, no ischemic changes.     Radiology CT Abdomen Pelvis W Contrast  Result Date: 04/11/2020 CLINICAL DATA:  Generalized abdominal pain EXAM: CT ABDOMEN AND PELVIS WITH CONTRAST TECHNIQUE: Multidetector CT imaging of the abdomen and pelvis was performed using the standard protocol following bolus administration of intravenous contrast. CONTRAST:  136mL OMNIPAQUE IOHEXOL 300 MG/ML  SOLN COMPARISON:  06/08/2017 FINDINGS: Lower chest: Lung bases are clear. No effusions. Heart is normal size. Hepatobiliary: No focal hepatic abnormality. Gallbladder unremarkable. Pancreas: No focal abnormality or ductal dilatation. Spleen: No focal abnormality.  Normal size.  Adrenals/Urinary Tract: No adrenal abnormality. No focal renal abnormality. No stones or hydronephrosis. Urinary bladder is unremarkable. Stomach/Bowel: Stomach, large and small bowel grossly unremarkable. Normal appendix Vascular/Lymphatic: No evidence of aneurysm or adenopathy. Reproductive: Uterus and adnexa unremarkable.  No mass. Other: 2 No free fluid or free air. Musculoskeletal: No acute bony abnormality. IMPRESSION: Normal study. Electronically Signed   By: Rolm Baptise M.D.   On: 04/11/2020 23:34    Procedures Procedures (including critical care time)  Medications Ordered in ED Medications - No data to display  ED Course  I have reviewed the triage vital signs and the nursing notes.  Pertinent labs & imaging results that were available during my care of the patient were reviewed by me and considered in my medical decision making (see chart for details).    MDM Rules/Calculators/A&P                         Patient presents to the ED with complaints of abdominal pain for the past 2 to 3 weeks.  Presented to the emergency department per the direction of her PCP for CT imaging.  She is nontoxic, resting comfortably, her vitals are notable for tachycardia which has normalized.   Additional history obtained:  Additional history obtained from chart review and nursing note reviewed. Lab Tests:  I reviewed and interpreted labs, which included:  CBC: Mild leukocytosis. CMP: Generally unremarkable. Lipase: Very mildly elevated. Urinalysis: No UTI.  Imaging Studies ordered:  I ordered imaging studies which included CT abdomen/pelvis with contrast-patient's allergy documented is nausea and vomiting therefore no premedication prior to contrast, I independently visualized and interpreted imaging which showed no acute significant process.  EKG: sinus bradycardia.   Work-up in the emergency department is overall reassuring.  On repeat abdominal exam patient remains without peritoneal signs,  she is feeling improved & tolerating PO.  CT  abdomen/pelvis with contrast shows no acute abnormality, no signs of appendicitis, cholecystitis, perforation, obstruction, or abnormalities of the pancreas.  She has a mild elevation in her lipase. Potentially multifactorial pain. Continue bowel regimen per PCP as well as prilosec per PCP, will add carafate & zofran. Recommend clear liquid diet with gradual advancement. I discussed results, treatment plan, need for follow-up, and return precautions with the patient. Provided opportunity for questions, patient confirmed understanding and is in agreement with plan.   Portions of this note were generated with [employer]. Dictation errors may occur despite best attempts at proofreading.  Final Clinical Impression(s) / ED Diagnoses Final diagnoses:  Generalized abdominal pain    Rx / DC Orders ED Discharge Orders         Ordered    sucralfate (CARAFATE) 1 GM/10ML suspension  3 times daily with meals & bedtime        04/12/20 0127    ondansetron (ZOFRAN ODT) 4 MG disintegrating tablet  Every 8 hours PRN        04/12/20 0127           Rayola Everhart, Glynda Jaeger, PA-C 04/12/20 0129    Charlesetta Shanks, MD 04/17/20 [phone]

## 2020-04-11 NOTE — Telephone Encounter (Signed)
Patient calls nurse line to make provider aware that she is going to go to the ED for further evaluation. Patient states that she spoke with the doctor earlier regarding lab results and abdominal pain and was advised to be evaluated further.   FYI to PCP  Talbot Grumbling, RN

## 2020-04-11 NOTE — ED Triage Notes (Signed)
Pt arrives to ED w/ c/o 8/10 generalized abdominal pain, sent by PCP for concern for pancreatitis. Pt endorses intermittent constipation, last BM today. Pt endorses nausea but denies vomiting.

## 2020-04-12 MED ORDER — ONDANSETRON 4 MG PO TBDP: 4.0000 mg | ORAL_TABLET | Freq: Three times a day (TID) | ORAL | 0 refills | Status: DC | PRN

## 2020-04-12 MED ORDER — SUCRALFATE 1 GM/10ML PO SUSP: 1.0000 g | Freq: Three times a day (TID) | ORAL | 0 refills | Status: DC

## 2020-04-12 NOTE — Discharge Instructions (Signed)
You were seen in the emergency department today for abdominal pain.  Your CT scan was overall reassuring.  Your lipase which looks at your pancreas was mildly elevated, for this reason we would like you to follow the clear liquid diet attached to your discharge instructions.  Please slowly advance your diet as your discomfort allows.  We are also sending you home with Carafate to take prior to meals and prior to bedtime to help with pain and Zofran to take every 8 hours as needed for nausea and vomiting.  We have prescribed you new medication(s) today. Discuss the medications prescribed today with your pharmacist as they can have adverse effects and interactions with your other medicines including over the counter and prescribed medications. Seek medical evaluation if you start to experience new or abnormal symptoms after taking one of these medicines, seek care immediately if you start to experience difficulty breathing, feeling of your throat closing, facial swelling, or rash as these could be indications of a more serious allergic reaction   Please continue medications prescribed by your primary care provider.  Please follow-up with gastroenterology within 3 days for reevaluation.  Return to the ER for new or worsening symptoms including but not limited to worsening pain, inability to keep fluids down, fever, blood in vomit or stool, or any other concerns.

## 2020-04-22 ENCOUNTER — Ambulatory Visit
Admission: RE | Admit: 2020-04-22 | Discharge: 2020-04-22 | Disposition: A | Discharge status: Home / Self Care | Payer: BC Managed Care – PPO | Source: Ambulatory Visit | Attending: Family Medicine | Admitting: Family Medicine

## 2020-04-22 ENCOUNTER — Other Ambulatory Visit: Payer: Self-pay

## 2020-04-22 DIAGNOSIS — Z820 Family history of epilepsy and other diseases of the nervous system: Secondary | ICD-10-CM

## 2020-04-22 DIAGNOSIS — G379 Demyelinating disease of central nervous system, unspecified: Secondary | ICD-10-CM

## 2020-04-22 DIAGNOSIS — G9389 Other specified disorders of brain: Secondary | ICD-10-CM | POA: Diagnosis not present

## 2020-04-22 MED ORDER — GADOBUTROL 1 MMOL/ML IV SOLN
8.0000 mL | Freq: Once | INTRAVENOUS | Status: AC | PRN
  Administered 2020-04-22: 8 mL via INTRAVENOUS

## 2020-04-24 ENCOUNTER — Telehealth: Payer: Self-pay | Admitting: *Deleted

## 2020-04-24 ENCOUNTER — Encounter: Payer: Self-pay | Admitting: *Deleted

## 2020-04-24 ENCOUNTER — Telehealth (INDEPENDENT_AMBULATORY_CARE_PROVIDER_SITE_OTHER): Payer: BC Managed Care – PPO | Admitting: Diagnostic Neuroimaging

## 2020-04-24 ENCOUNTER — Encounter: Payer: Self-pay | Admitting: Diagnostic Neuroimaging

## 2020-04-24 DIAGNOSIS — R202 Paresthesia of skin: Secondary | ICD-10-CM | POA: Diagnosis not present

## 2020-04-24 DIAGNOSIS — R200 Anesthesia of skin: Secondary | ICD-10-CM | POA: Diagnosis not present

## 2020-04-24 DIAGNOSIS — M79604 Pain in right leg: Secondary | ICD-10-CM

## 2020-04-24 NOTE — Telephone Encounter (Signed)
Received in my chart today: Hello I just received results of a MRI I had done on my brain. It is suggestive to me having MS. My doctor wanted me to reach back out to you guys for further care about results. Could you please look at them and give me a call ASAP. Thank you

## 2020-04-24 NOTE — Telephone Encounter (Signed)
Pls setup video visit to discuss further. -VRP

## 2020-04-24 NOTE — Telephone Encounter (Signed)
Called patient and advised her Dr Candice [name] would like to have a virtual visit with her to discuss MRI results. We scheduled for today. Patient verbalized understanding, appreciation.

## 2020-04-24 NOTE — Progress Notes (Addendum)
[facility]  PATIENT: Candice [name] DOB: Mar 10, 1986  REFERRING CLINICIAN: Doristine Mango L, DO HISTORY FROM: PATIENT  REASON FOR VISIT: Follow-up   HISTORICAL  CHIEF COMPLAINT:  Chief Complaint  Patient presents with  . Neurologic Problem    MS evaluation    HISTORY OF PRESENT ILLNESS:   UPDATE (04/24/20, VRP): Since last visit, symptoms are stable to slightly improved.  MRI of thoracic spine results reviewed.  Patient also MRI of the brain which shows several nonspecific T2 hyperintensities, but raising possibility of multiple sclerosis.  No acute plaques noted in the brain or thoracic spine.  Patient reports several years of intermittent numbness, weakness, dizziness in the past which have never specifically been diagnosed.  Symptoms would last a few weeks at a time then resolved.  These symptoms started prior to her exposure and diagnosis of HIV.  PRIOR HPI: 34 year old female here for evaluation of right leg numbness.  March 2021 patient was diagnosed with HIV and started on antiviral therapy.  In May/June 2021 when she noticed pain and numbness around her right flank region, radiating around to her right hip and right leg.  Her entire right leg feels numb.  She feels some weakness.  She is slightly dragging her leg.  She is noted some increasing swelling in her right leg.  She denies any rash or shingles outbreak.  At rest she has no pain.  She is sensitive to touch on her skin in the right leg.  She has noticed some slight right arm problems in the last few weeks.   REVIEW OF SYSTEMS: Full 14 system review of systems performed and negative with exception of: As per HPI.  ALLERGIES: Allergies  Allergen Reactions  . Contrast Media [Iodinated Diagnostic Agents] Nausea And Vomiting    HOME MEDICATIONS: Outpatient Medications Prior to Visit  Medication Sig Dispense Refill  . bictegravir-emtricitabine-tenofovir AF (BIKTARVY) 50-200-25 MG TABS tablet  Take 1 tablet by mouth daily. 30 tablet 5  . naproxen sodium (ALEVE) 220 MG tablet Take 220 mg by mouth daily as needed (For pain).    Marland Kitchen omeprazole (PRILOSEC) 20 MG capsule Take 1 capsule (20 mg total) by mouth daily. 60 capsule 2  . ondansetron (ZOFRAN ODT) 4 MG disintegrating tablet Take 1 tablet (4 mg total) by mouth every 8 (eight) hours as needed for nausea or vomiting. 5 tablet 0  . psyllium (METAMUCIL) 58.6 % packet Take 1 packet by mouth daily.    Marland Kitchen senna (SENOKOT) 8.6 MG TABS tablet Take 1 tablet (8.6 mg total) by mouth daily. 30 tablet 0  . sucralfate (CARAFATE) 1 GM/10ML suspension Take 10 mLs (1 g total) by mouth 4 (four) times daily -  with meals and at bedtime. 420 mL 0   No facility-administered medications prior to visit.    PAST MEDICAL HISTORY: Past Medical History:  Diagnosis Date  . Complication of anesthesia    epidural with last pregnancy made entire left side numb, had to d/c  . Depression   . Headache   . HSV 06/28/2007  . Hx of migraines     PAST SURGICAL HISTORY: Past Surgical History:  Procedure Laterality Date  . BREAST SURGERY  2019   reduction  . CESAREAN SECTION    . CESAREAN SECTION  09/07/2011   Procedure: CESAREAN SECTION;  Surgeon: Florian Buff, MD;  Location: [facility];  Service: Gynecology;  Laterality: N/A;  Repeat cesarean section with delivery of baby boy at 105. Apgars 9/9.  Bilateral tubal ligation.  . IUD REMOVAL  2005   in cervix  . TUBAL LIGATION    . tubal reanastomosis  08/13/2016    FAMILY HISTORY: Family History  Problem Relation Age of Onset  . Alcohol abuse Mother   . Drug abuse Mother   . Depression Mother   . Alcohol abuse Father   . Drug abuse Father   . Mental illness Sister        bipolar ptsd  . Schizophrenia Sister   . Diabetes Maternal Grandmother   . Hypertension Maternal Grandmother   . Cancer Maternal Grandmother        ovarian, breast and lung  . Depression Maternal Grandmother     SOCIAL HISTORY: Social  History   Socioeconomic History  . Marital status: Single    Spouse name: Not on file  . Number of children: 2  . Years of education: 2  . Highest education level: Not on file  Occupational History    Comment: grocery manager  Tobacco Use  . Smoking status: Current Every Day Smoker    Packs/day: 0.25    Types: Cigarettes  . Smokeless tobacco: Never Used  Vaping Use  . Vaping Use: Never used  Substance and Sexual Activity  . Alcohol use: Not Currently    Comment: occasional  . Drug use: Yes    Frequency: 3.0 times per week    Types: Marijuana  . Sexual activity: Yes    Partners: Male    Birth control/protection: None    Comment: Decline condoms 03/29/2020  Other Topics Concern  . Not on file  Social History Narrative   Works as Ecologist.   FOB of both children lives in [place]; will be some what involved but mother is no longer dating him.   Social Determinants of Health   Financial Resource Strain:   . Difficulty of Paying Living Expenses: Not on file  Food Insecurity:   . Worried About Charity fundraiser in the Last Year: Not on file  . Ran Out of Food in the Last Year: Not on file  Transportation Needs:   . Lack of Transportation (Medical): Not on file  . Lack of Transportation (Non-Medical): Not on file  Physical Activity:   . Days of Exercise per Week: Not on file  . Minutes of Exercise per Session: Not on file  Stress:   . Feeling of Stress : Not on file  Social Connections:   . Frequency of Communication with Friends and Family: Not on file  . Frequency of Social Gatherings with Friends and Family: Not on file  . Attends Religious Services: Not on file  . Active Member of Clubs or Organizations: Not on file  . Attends Archivist Meetings: Not on file  . Marital Status: Not on file  Intimate Partner Violence:   . Fear of Current or Ex-Partner: Not on file  . Emotionally Abused: Not on file  . Physically Abused: Not on file  . Sexually  Abused: Not on file     PHYSICAL EXAM  Video visit     DIAGNOSTIC DATA (LABS, IMAGING, TESTING) - I reviewed patient records, labs, notes, testing and imaging myself where available.  Lab Results  Component Value Date   WBC 13.3 (H) 04/11/2020   HGB 12.2 04/11/2020   HCT 38.2 04/11/2020   MCV 88.4 04/11/2020   PLT 310 04/11/2020      Component Value Date/Time   NA 139 04/11/2020 1733  NA 141 04/02/2020 1150   K 3.8 04/11/2020 1733   CL 107 04/11/2020 1733   CO2 22 04/11/2020 1733   GLUCOSE 95 04/11/2020 1733   GLUCOSE 104 06/06/2011 1142   BUN 10 04/11/2020 1733   BUN 10 04/02/2020 1150   CREATININE 0.76 04/11/2020 1733   CREATININE 0.72 03/29/2020 1057   CALCIUM 9.3 04/11/2020 1733   PROT 6.9 04/11/2020 1733   PROT 7.3 04/02/2020 1150   ALBUMIN 3.7 04/11/2020 1733   ALBUMIN 4.5 04/02/2020 1150   AST 13 (L) 04/11/2020 1733   ALT 11 04/11/2020 1733   ALKPHOS 44 04/11/2020 1733   BILITOT 0.4 04/11/2020 1733   BILITOT 0.2 04/02/2020 1150   GFRNONAA >60 04/11/2020 1733   GFRNONAA 108 03/29/2020 1057   GFRAA 119 04/02/2020 1150   GFRAA 125 03/29/2020 1057   Lab Results  Component Value Date   CHOL 166 09/08/2019   HDL 48 (L) 09/08/2019   LDLCALC 102 (H) 09/08/2019   TRIG 70 09/08/2019   CHOLHDL 3.5 09/08/2019   Lab Results  Component Value Date   HGBA1C 5.6 02/13/2020   Lab Results  Component Value Date   VITAMINB12 304 02/13/2020   Lab Results  Component Value Date   TSH 0.741 06/08/2018     02/11/20 MRI lumbar spine [I reviewed images myself and agree with interpretation. -VRP]  - Central disc protrusion at L5-S1, contacting the thecal sac and S1 root sleeves as they bud from the thecal sac. Nerve compression is not demonstrated. This abnormality could  result in nerve irritation. -  Mild facet osteoarthritis at L4-5 and L5-S1 that could contribute to low back pain.  02/25/20 MRI thoracic spine [I reviewed images myself and agree with  interpretation. -VRP]  1.   There is a T2 hyperintense focus with subtle enhancement adjacent to T8-T9 in the right posterolateral spinal cord.  Though nonspecific, this is most likely to represent a focus of demyelination or inflammation.  Consider MRI of the brain to determine if there is evidence of demyelination elsewhere. 2.   No spinal degenerative changes.  04/22/20 MRI brain [I reviewed images myself and agree with interpretation. -VRP]  1. Multiple scattered patchy T2/FLAIR hyperintensities involving the supratentorial cerebral white matter, nonspecific, but most suspicious for possible demyelinating disease/multiple sclerosis. No evidence for active demyelination. 2. Otherwise normal brain MRI for age.    ASSESSMENT AND PLAN  34 y.o. year old female here with HIV since March 2021, now with numbness and weakness of right leg since June 2021. Also with right lower thoracic numbness, right hip numbness.   Dx:  1. Right leg pain   2. Numbness and tingling      Virtual Visit via Video Note  I connected with Candice [name] on 04/24/20 at  3:30 PM EDT by a video enabled telemedicine application and verified that I am speaking with the correct person using two identifiers.  Location: Patient: home  Provider: office   I discussed the limitations of evaluation and management by telemedicine and the availability of in person appointments. The patient expressed understanding and agreed to proceed.   I discussed the assessment and treatment plan with the patient. The patient was provided an opportunity to ask questions and all were answered. The patient agreed with the plan and demonstrated an understanding of the instructions.   The patient was advised to call back or seek an in-person evaluation if the symptoms worsen or if the condition fails to improve as anticipated.  I provided 15 minutes of non-face-to-face time during this encounter.    PLAN:  ABNORMAL MRI BRAIN /  THORACIC SPINE (suspect multiple sclerosis) - check MRI cervical spine  - check LP; then check MS mimic lab testing  HIV  - continue biktarvy  Orders Placed This Encounter  Procedures  . MR CERVICAL SPINE W WO CONTRAST  . DG FL GUIDED LUMBAR PUNCTURE   Return in about 3 months (around 07/25/2020).    Penni Bombard, MD 95/08/9670, 8:97 PM Certified in Neurology, Neurophysiology and Neuroimaging  [facility] [address], [place], [place] [address] [phone]

## 2020-04-25 ENCOUNTER — Telehealth: Payer: Self-pay | Admitting: Diagnostic Neuroimaging

## 2020-04-25 NOTE — Telephone Encounter (Signed)
BCBS auth through [place]: [id number] (exp. 04/25/20 to 05/25/20) order sent to [facility]. They will reach out to the patient to schedule.

## 2020-04-25 NOTE — Telephone Encounter (Signed)
BCBS pending faxed notes.  

## 2020-04-30 ENCOUNTER — Ambulatory Visit
Admission: RE | Admit: 2020-04-30 | Discharge: 2020-04-30 | Disposition: A | Discharge status: Home / Self Care | Payer: BC Managed Care – PPO | Source: Ambulatory Visit | Attending: Diagnostic Neuroimaging | Admitting: Diagnostic Neuroimaging

## 2020-04-30 ENCOUNTER — Other Ambulatory Visit: Payer: Self-pay

## 2020-04-30 ENCOUNTER — Telehealth: Payer: Self-pay | Admitting: Diagnostic Neuroimaging

## 2020-04-30 DIAGNOSIS — R200 Anesthesia of skin: Secondary | ICD-10-CM | POA: Diagnosis not present

## 2020-04-30 DIAGNOSIS — M79604 Pain in right leg: Secondary | ICD-10-CM | POA: Diagnosis not present

## 2020-04-30 DIAGNOSIS — R202 Paresthesia of skin: Secondary | ICD-10-CM

## 2020-04-30 MED ORDER — GADOBUTROL 1 MMOL/ML IV SOLN
8.0000 mL | Freq: Once | INTRAVENOUS | Status: AC | PRN
  Administered 2020-04-30: 8 mL via INTRAVENOUS

## 2020-04-30 NOTE — Telephone Encounter (Signed)
Kendra from GI called needing to speak to the RN about the orders that were sent in for the pt's LP. The order stated that the pt needed 13hr prep due to the pt being allergic to contrast but GI does not use contrast for the LP. Tillie Rung would like to be called tomorrow at [phone]

## 2020-05-01 NOTE — Telephone Encounter (Signed)
Called Tillie Rung at [facility] who stated LP order says patient needs premeds due to allergy to contrast. She stated they do not use contrast for LPs. I reviewed allergies, told Tillie Rung that nausea, vomiting reaction to contrast was entered on day she went to ED, 10/21, had CT of abdomen with contrast, no notes found that state she had allergic reaction. LP order states : LP W/LABS [employer] 179 LBS PACS (04/22/20) NO THINS/OTC *SCREENED*  PT ALLERGIC TO CT CM-13HR PERP-PER PT HAS RASH,CONVULSION-[facility] [place]/ LMOM TO NURSE.  I advised Tillie Rung I never received message about this allergic reaction as stated above. Tillie Rung again stated they do not use contrast for LP, she will discuss with their RN. Marland Kitchen

## 2020-05-01 NOTE — Telephone Encounter (Signed)
I did not order pre-medication. -VRP

## 2020-05-01 NOTE — Telephone Encounter (Signed)
Called Tillie Rung and advised of Dr Ross Stores. She stated that the scheduler had put in premed orders in error. She verbalized understanding, appreciation.

## 2020-05-03 ENCOUNTER — Ambulatory Visit
Admission: RE | Admit: 2020-05-03 | Discharge: 2020-05-03 | Disposition: A | Discharge status: Home / Self Care | Payer: BC Managed Care – PPO | Source: Ambulatory Visit | Attending: Diagnostic Neuroimaging | Admitting: Diagnostic Neuroimaging

## 2020-05-03 ENCOUNTER — Other Ambulatory Visit: Payer: Self-pay

## 2020-05-03 DIAGNOSIS — M79604 Pain in right leg: Secondary | ICD-10-CM

## 2020-05-03 DIAGNOSIS — R200 Anesthesia of skin: Secondary | ICD-10-CM | POA: Diagnosis not present

## 2020-05-03 DIAGNOSIS — R202 Paresthesia of skin: Secondary | ICD-10-CM | POA: Diagnosis not present

## 2020-05-03 DIAGNOSIS — G379 Demyelinating disease of central nervous system, unspecified: Secondary | ICD-10-CM | POA: Diagnosis not present

## 2020-05-03 MED ORDER — DIAZEPAM 5 MG PO TABS
10.0000 mg | ORAL_TABLET | Freq: Once | ORAL | Status: AC
  Administered 2020-05-03: 10 mg via ORAL

## 2020-05-03 NOTE — Discharge Instructions (Signed)

## 2020-05-08 ENCOUNTER — Encounter: Payer: Self-pay | Admitting: *Deleted

## 2020-05-10 ENCOUNTER — Telehealth: Payer: Self-pay | Admitting: Diagnostic Neuroimaging

## 2020-05-10 ENCOUNTER — Encounter: Payer: Self-pay | Admitting: *Deleted

## 2020-05-10 ENCOUNTER — Encounter: Payer: Self-pay | Admitting: Student in an Organized Health Care Education/Training Program

## 2020-05-10 ENCOUNTER — Telehealth: Payer: Self-pay | Admitting: *Deleted

## 2020-05-10 DIAGNOSIS — G35 Multiple sclerosis: Secondary | ICD-10-CM

## 2020-05-10 DIAGNOSIS — Z21 Asymptomatic human immunodeficiency virus [HIV] infection status: Secondary | ICD-10-CM

## 2020-05-10 LAB — VDRL, CSF: VDRL Quant, CSF: NONREACTIVE

## 2020-05-10 LAB — CSF CELL COUNT WITH DIFFERENTIAL
RBC Count, CSF: 6 cells/uL — ABNORMAL HIGH
WBC, CSF: 0 cells/uL (ref 0–5)

## 2020-05-10 LAB — CSF CULTURE W GRAM STAIN
GRAM STAIN:: NONE SEEN
MICRO NUMBER:: 11191164
Result:: NO GROWTH
SPECIMEN QUALITY:: ADEQUATE

## 2020-05-10 LAB — OLIGOCLONAL BANDS, CSF + SERM

## 2020-05-10 LAB — PROTEIN, CSF: Total Protein, CSF: 23 mg/dL (ref 15–45)

## 2020-05-10 LAB — GLUCOSE, CSF: Glucose, CSF: 59 mg/dL (ref 40–80)

## 2020-05-10 NOTE — Telephone Encounter (Signed)
Thank you for the detailed information. This is certainly an interesting development. I agree that based on the research it appears that this exacerbation may be related to the immune reconstitution of CD4 cells when started on the [place]. It certainly would seem reasonable to change her medication to Atripla which contains the tenofovir-disorpoxil fumarate in attempts to provide protection again potential for future MS flares and to see if her symptoms are improved with the change. I will leave any disease modifying agents (I.e, IFN beta-1a) to your discretion. I am going to continue to look into this a bit further as well. Please let me know if I can be of further assistance. Thanks!

## 2020-05-10 NOTE — Telephone Encounter (Signed)
I called patient to discuss results.  Lumbar puncture demonstrates greater than 5 well-defined oligoclonal bands in the CSF that are not present in the serum.  These findings in conjunction with MRI brain, MRI thoracic spine findings, clinical symptoms, rule out labs, confirm diagnosis of multiple sclerosis.  I asked patient about symptoms and timeframe again.  In summary she has had some neurologic symptoms including numbness and pain in her legs, headaches, dizziness going back several years, possibly 2018 or earlier.  In March 2021 when she was diagnosed with HIV.  She was started on Biktarvy at that time and within a few months developed new onset neurologic symptoms from her waist down to her right leg.  This correlates with a thoracic spine enhancing lesion on MRI, and consistent with multiple sclerosis exacerbation.  I discussed options for long-term treatment.  I would like to discuss further with patient's HIV physician as HIV medication and multiple sclerosis disease modifying therapy treatments may have complementary or opposing effects on the immune system.  I conducted a brief literature review of case reports of concomitant HIV and multiple sclerosis diagnoses, and apparently fewer than 10 cases have been reported in the literature thus far.  In situations where patient developed multiple sclerosis first and later on developed HIV, HIV diagnosis and treatment seem to confer protective benefit on multiple sclerosis progression.  This is different than our patients clinical course.  It appears that after initiation of Biktarvy, patient developed an exacerbation, possibly as viral load became under control and CD4 counts increased.  Dx:  1. Relapsing remitting multiple sclerosis ([place])   2. Asymptomatic HIV infection, with no history of HIV-related illness ([place])      PLAN:  - will discuss further with ID clinic about watchful waiting vs changing Biktarvy to [place] (see case report  "Multiple Sclerosis and Subsequent Human Immunodeficiency Virus Infection: A Case with the Rare Comorbidity, Focus on Novel Treatment Issues and Review of the Literature" [email or web address].[email or web address]/)  - may consider adding on avonex or plegridy (low dose interferon beta-1a)    Penni Bombard, MD 93/55/2174, 71:59 AM Certified in Neurology, Neurophysiology and [facility] [address], [place], [place] [address] [phone]

## 2020-05-10 NOTE — Telephone Encounter (Signed)
Received my chart asking to discuss lab results. Per Dr Leta Baptist called patient, LVM advising her Dr Leta Baptist would like to talk with her on the phone this morning. Requested she call or send message giving good time'

## 2020-05-14 ENCOUNTER — Telehealth: Payer: Self-pay

## 2020-05-14 NOTE — Telephone Encounter (Signed)
Patient contacted in regards to Candice [name]. Patient reports she has been feeling overwhelmed recently with a lot of things. Patient denies wanting to harm herself or others. Patient reports, "I just want to talk it out with someone." I have scheduled her with PCP on 11/29. However, patient would like to discuss with Dr. Hartford Poli. Patient adamant about not being on any medications for depression/anxiety.

## 2020-05-14 NOTE — Telephone Encounter (Signed)
Called and LVM to schedule appt with me to call clinic back.   Gave suicide hotline number and [place] info.

## 2020-05-14 NOTE — Telephone Encounter (Signed)
I had called and left voicemail today as well as sent mychart message requesting patient contact me back to see how she is doing.  If not heard back by tomorrow, will plan a welfare check.

## 2020-05-21 ENCOUNTER — Ambulatory Visit (INDEPENDENT_AMBULATORY_CARE_PROVIDER_SITE_OTHER): Payer: BC Managed Care – PPO | Admitting: Student in an Organized Health Care Education/Training Program

## 2020-05-21 ENCOUNTER — Encounter: Payer: Self-pay | Admitting: Student in an Organized Health Care Education/Training Program

## 2020-05-21 ENCOUNTER — Other Ambulatory Visit: Payer: Self-pay

## 2020-05-21 VITALS — BP 110/75 | HR 86 | Ht 63.0 in | Wt 186.2 lb

## 2020-05-21 DIAGNOSIS — F331 Major depressive disorder, recurrent, moderate: Secondary | ICD-10-CM

## 2020-05-21 DIAGNOSIS — K219 Gastro-esophageal reflux disease without esophagitis: Secondary | ICD-10-CM | POA: Insufficient documentation

## 2020-05-21 DIAGNOSIS — M7741 Metatarsalgia, right foot: Secondary | ICD-10-CM | POA: Diagnosis not present

## 2020-05-21 DIAGNOSIS — Z21 Asymptomatic human immunodeficiency virus [HIV] infection status: Secondary | ICD-10-CM

## 2020-05-21 DIAGNOSIS — G379 Demyelinating disease of central nervous system, unspecified: Secondary | ICD-10-CM

## 2020-05-21 DIAGNOSIS — G35 Multiple sclerosis: Secondary | ICD-10-CM | POA: Diagnosis not present

## 2020-05-21 HISTORY — DX: Gastro-esophageal reflux disease without esophagitis: K21.9

## 2020-05-21 MED ORDER — SUCRALFATE 1 GM/10ML PO SUSP: 1.0000 g | Freq: Three times a day (TID) | ORAL | 0 refills | Status: DC

## 2020-05-21 MED ORDER — CYCLOBENZAPRINE HCL 10 MG PO TABS: 10.0000 mg | ORAL_TABLET | Freq: Three times a day (TID) | ORAL | 0 refills | Status: DC | PRN

## 2020-05-21 NOTE — Progress Notes (Signed)
   SUBJECTIVE:   CHIEF COMPLAINT / HPI: depression f/u  Depression- patient denies SI or self harm thoughts. She is feeling very overwhelmed with her recent medical diagnoses. She has a good support system. Not interested in medications at this time as she is concerned with medication interactions and does not want more pills to take. She is interested in resuming counseling with Dr. Hartford Poli.   MS- recently diagnosed. Her symptoms include leg weakness and right abdomen discomfort as well as intermittent left sided headaches. No visual loss. She endorses having improvement in her symptoms since the last time we spoke. No follow up scheduled with neurology at this time.   HIV- well-controlled. consistent with her medications and without side effects.   metatarsalgia- improved with pad. Has some shifting pain to the more medial side of right foot now.   GERD- takes omeprazole intermittently as well as naproxen 1-2x/day. Endorses a metallic reflux taste. No nausea or vomiting.   OBJECTIVE:   BP 110/75   Pulse 86   Ht 5\' 3"  (1.6 m)   Wt 186 lb 3.2 oz (84.5 kg)   LMP 05/08/2020 (Exact Date)   SpO2 100%   BMI 32.98 kg/m   General: NAD, pleasant, able to participate in exam Abdomen: soft, nontender, nondistended, no hepatic or splenomegaly, +BS Extremities: no edema. WWP. Tenderness to lateral right thigh soft tissue without trigger point.  Skin: warm and dry, no rashes noted Neuro: alert and oriented, no focal deficits LE strength symmetrical and full. Endorses decreased sensation to soft touch on right leg compared to right. Negative straight leg raise bilaterally Psych: Normal affect and mood. Tearful during discussion of diagnoses.  ASSESSMENT/PLAN:   Metatarsalgia of right foot Provided with a new metatarsal pad F/u 3-4 weeks  Major depressive disorder, recurrent episode, moderate In relation to new medical diagnosis of MS and HIV this year Patient declines medication at this  time Denies SI/self harm thoughts Interested in counseling. Has seen Dr. Hartford Poli previously and wants to speak with her again Message sent to Dr. Hartford Poli for follow up  MS (multiple sclerosis) ([place]) Symptoms are improved from last visit.  Discussed some details about the disease and recommended close follow up with neurologist Prescribed flexeril Referred for PT/OT Given at home thoracic spine rehab exercises  HIV (human immunodeficiency virus infection) ([place]) Well controlled.  Encouraged to follow up with ID.  Appears that the ID team had previously discussed changing her therapy to better accommodate MS treatments  GERD (gastroesophageal reflux disease) Continue omeprazole Refilled sucralfate which was not picked up at last prescription Recommended to decrease naproxen use     Richarda Osmond, [place]

## 2020-05-21 NOTE — Assessment & Plan Note (Signed)
In relation to new medical diagnosis of MS and HIV this year Patient declines medication at this time Denies SI/self harm thoughts Interested in counseling. Has seen Dr. Hartford Poli previously and wants to speak with her again Message sent to Dr. Hartford Poli for follow up

## 2020-05-21 NOTE — Assessment & Plan Note (Signed)
Continue omeprazole Refilled sucralfate which was not picked up at last prescription Recommended to decrease naproxen use

## 2020-05-21 NOTE — Assessment & Plan Note (Signed)
Well controlled.  Encouraged to follow up with ID.  Appears that the ID team had previously discussed changing her therapy to better accommodate MS treatments

## 2020-05-21 NOTE — Assessment & Plan Note (Addendum)
Symptoms are improved from last visit.  Discussed some details about the disease and recommended close follow up with neurologist Prescribed flexeril Referred for PT/OT Given at home thoracic spine rehab exercises

## 2020-05-21 NOTE — Progress Notes (Signed)
Called and scheduled her for Thursday at 11AM

## 2020-05-21 NOTE — Patient Instructions (Addendum)
It was a pleasure to see you today!  To summarize our discussion for this visit:  I'm sorry that you're going through so much right now. I'm glad that you're reaching out for help. We can get you set up with Dr. Hartford Poli for counseling. If you feel that you need anything else, please don't hesitate to reach out again.  I know you're in pain but I'd like to decrease the amount of naproxen you are using to prevent any digestive/stomach ulcers. I'm sending in PT/OT referrals and giving you some back rehab exercises here.   i'm also filling a muscle relaxer and you can take tylenol as needed  You can move or add a metatarsal pad to your shoe in the spot you feel is bothering you the most.   Sucralfate should be at your pharmacy as a treatment for acid reflux, if it is not, I will send in another prescription  Very important, please follow up with your neurologist and ID doctor for further treatment. And I will continue to follow along and help coordinate any other needs you have  Some additional health maintenance measures we should update are: Health Maintenance Due  Topic Date Due  . COVID-19 Vaccine (1) Never done  . INFLUENZA VACCINE  01/22/2020  .    Please return to our clinic to see me in 3-4 weeks.  Call the clinic at [phone] if your symptoms worsen or you have any concerns.   Thank you for allowing me to take part in your care,  Dr. Doristine Mango   Thoracic Strain Rehab Ask your health care provider which exercises are safe for you. Do exercises exactly as told by your health care provider and adjust them as directed. It is normal to feel mild stretching, pulling, tightness, or discomfort as you do these exercises. Stop right away if you feel sudden pain or your pain gets worse. Do not begin these exercises until told by your health care provider. Stretching and range-of-motion exercise This exercise warms up your muscles and joints and improves the movement and  flexibility of your back and shoulders. This exercise also helps to relieve pain. Chest and spine stretch  1. Lie down on your back on a firm surface. 2. Roll a towel or a small blanket so it is about 4 inches (10 cm) in diameter. 3. Put the towel lengthwise under the middle of your back so it is under your spine, but not under your shoulder blades. 4. Put your hands behind your head and let your elbows fall to your sides. This will increase your stretch. 5. Take a deep breath (inhale). 6. Hold for __________ seconds. 7. Relax after you breathe out (exhale). Repeat __________ times. Complete this exercise __________ times a day. Strengthening exercises These exercises build strength and endurance in your back and your shoulder blade muscles. Endurance is the ability to use your muscles for a long time, even after they get tired. Alternating arm and leg raises  1. Get on your hands and knees on a firm surface. If you are on a hard floor, you may want to use padding, such as an exercise mat, to cushion your knees. 2. Line up your arms and legs. Your hands should be directly below your shoulders, and your knees should be directly below your hips. 3. Lift your left leg behind you. At the same time, raise your right arm and straighten it in front of you. ? Do not lift your leg higher than your  hip. ? Do not lift your arm higher than your shoulder. ? Keep your abdominal and back muscles tight. ? Keep your hips facing the ground. ? Do not arch your back. ? Keep your balance carefully, and do not hold your breath. 4. Hold for __________ seconds. 5. Slowly return to the starting position and repeat with your right leg and your left arm. Repeat __________ times. Complete this exercise __________ times a day. Straight arm rows This exercise is also called shoulder extension exercise. 1. Stand with your feet shoulder width apart. 2. Secure an exercise band to a stable object in front of you so the  band is at or above shoulder height. 3. Hold one end of the exercise band in each hand. 4. Straighten your elbows and lift your hands up to shoulder height. 5. Step back, away from the secured end of the exercise band, until the band stretches. 6. Squeeze your shoulder blades together and pull your hands down to the sides of your thighs. Stop when your hands are straight down by your sides. This is shoulder extension. Do not let your hands go behind your body. 7. Hold for __________ seconds. 8. Slowly return to the starting position. Repeat __________ times. Complete this exercise __________ times a day. Prone shoulder external rotation 1. Lie on your abdomen on a firm bed so your left / right forearm hangs over the edge of the bed and your upper arm is on the bed, straight out from your body. This is the prone position. ? Your elbow should be bent. ? Your palm should be facing your feet. 2. If instructed, hold a __________ weight in your hand. 3. Squeeze your shoulder blade toward the middle of your back. Do not let your shoulder lift toward your ear. 4. Keep your elbow bent in a 90-degree angle (right angle) while you slowly move your forearm up toward the ceiling. Move your forearm up to the height of the bed, toward your head. This is external rotation. ? Your upper arm should not move. ? At the top of the movement, your palm should face the floor. 5. Hold for __________ seconds. 6. Slowly return to the starting position and relax your muscles. Repeat __________ times. Complete this exercise __________ times a day. Rowing scapular retraction This is an exercise in which the shoulder blades (scapulae) are pulled toward each other (retraction). 1. Sit in a stable chair without armrests, or stand up. 2. Secure an exercise band to a stable object in front of you so the band is at shoulder height. 3. Hold one end of the exercise band in each hand. Your palms should face down. 4. Bring your  arms out straight in front of you. 5. Step back, away from the secured end of the exercise band, until the band stretches. 6. Pull the band backward. As you do this, bend your elbows and squeeze your shoulder blades together, but avoid letting the rest of your body move. Do not shrug your shoulders upward while you do this. 7. Stop when your elbows are at your sides or slightly behind your body. 8. Hold for __________ seconds. 9. Slowly straighten your arms to return to the starting position. Repeat __________ times. Complete this exercise __________ times a day. Posture and body mechanics Good posture and healthy body mechanics can help to relieve stress in your body's tissues and joints. Body mechanics refers to the movements and positions of your body while you do your daily activities. Posture is  part of body mechanics. Good posture means:  Your spine is in its natural S-curve position (neutral).  Your shoulders are pulled back slightly.  Your head is not tipped forward. Follow these guidelines to improve your posture and body mechanics in your everyday activities. Standing   When standing, keep your spine neutral and your feet about hip width apart. Keep a slight bend in your knees. Your ears, shoulders, and hips should line up with each other.  When you do a task in which you lean forward while standing in one place for a long time, place one foot up on a stable object that is 2-4 inches (5-10 cm) high, such as a footstool. This helps keep your spine neutral. Sitting   When sitting, keep your spine neutral and keep your feet flat on the floor. Use a footrest, if necessary, and keep your thighs parallel to the floor. Avoid rounding your shoulders, and avoid tilting your head forward.  When working at a desk or a computer, keep your desk at a height where your hands are slightly lower than your elbows. Slide your chair under your desk so you are close enough to maintain good  posture.  When working at a computer, place your monitor at a height where you are looking straight ahead and you do not have to tilt your head forward or downward to look at the screen. Resting When lying down and resting, avoid positions that are most painful for you.  If you have pain with activities such as sitting, bending, stooping, or squatting (flexion-basedactivities), lie in a position in which your body does not bend very much. For example, avoid curling up on your side with your arms and knees near your chest (fetal position).  If you have pain with activities such as standing for a long time or reaching with your arms (extension-basedactivities), lie with your spine in a neutral position and bend your knees slightly. Try the following positions: ? Lie on your side with a pillow between your knees. ? Lie on your back with a pillow under your knees.  Lifting   When lifting objects, keep your feet at least shoulder width apart and tighten your abdominal muscles.  Bend your knees and hips and keep your spine neutral. It is important to lift using the strength of your legs, not your back. Do not lock your knees straight out.  Always ask for help to lift heavy or awkward objects. This information is not intended to replace advice given to you by your health care provider. Make sure you discuss any questions you have with your health care provider. Document Revised: 10/01/2018 Document Reviewed: 07/19/2018 Elsevier Patient Education  [place].

## 2020-05-21 NOTE — Assessment & Plan Note (Signed)
Provided with a new metatarsal pad F/u 3-4 weeks

## 2020-05-24 ENCOUNTER — Other Ambulatory Visit: Payer: Self-pay

## 2020-05-24 ENCOUNTER — Ambulatory Visit (INDEPENDENT_AMBULATORY_CARE_PROVIDER_SITE_OTHER): Payer: BC Managed Care – PPO | Admitting: Psychology

## 2020-05-24 DIAGNOSIS — F331 Major depressive disorder, recurrent, moderate: Secondary | ICD-10-CM | POA: Diagnosis not present

## 2020-05-24 NOTE — BH Specialist Note (Signed)
Integrated Behavioral Health Follow Up In-Person Visit  MRN: [id number] Name: Candice [name]  Number of [place] Clinician visits: 2/6 Session Start time: 11  Session End time: 7062 Total time: 45  minutes  Types of Service: Individual psychotherapy     Subjective: Candice [name] is a 34 y.o. female  Patient was referred by Dr. Prince Rome for depression Patient reports the following symptoms/concerns: Pt reported she has been struggling with her new MS diagnosis.  Pt reports she feels tired and overwhelmed.  She shared experiences with family dyanics that have been overwhelming.  Discussed strengths and things she has learned from her diagnosis.   Pt denied SI.  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  Reduce symptoms of: depression : tiredness, sadness, feeling overwhelmed  2.  Increase knowledge and/or ability of: self-management skills : start meds again; recognize experiences as hard and grow from them   Progress towards Goals: Ongoing  Interventions: Interventions utilized:  Supportive Counseling Standardized Assessments completed: Not Needed  Patient and/or Family Response: Pt engaged and open to strategies to self regulate   Patient Centered Plan: Patient is on the following Treatment Plan(s): depression treatment plans Assessment: Patient currently experiencing depression related to medical diagnosis and past trauma   Patient may benefit from supportive counseling and reframe  of hardship (strength based process)  Plan: 1. Follow up with behavioral health clinician on : 1 month 2. Behavioral recommendations: continued supportive therapy  3. Referral(s): [place] (In Clinic)   Erlinda Hong, PhD., LMFT-A

## 2020-06-12 ENCOUNTER — Ambulatory Visit: Payer: BC Managed Care – PPO | Admitting: Psychology

## 2020-06-29 ENCOUNTER — Other Ambulatory Visit: Payer: Self-pay

## 2020-06-29 ENCOUNTER — Telehealth (INDEPENDENT_AMBULATORY_CARE_PROVIDER_SITE_OTHER): Payer: BC Managed Care – PPO | Admitting: Family

## 2020-06-29 ENCOUNTER — Encounter: Payer: Self-pay | Admitting: Family

## 2020-06-29 DIAGNOSIS — G35 Multiple sclerosis: Secondary | ICD-10-CM

## 2020-06-29 DIAGNOSIS — Z21 Asymptomatic human immunodeficiency virus [HIV] infection status: Secondary | ICD-10-CM

## 2020-06-29 DIAGNOSIS — B20 Human immunodeficiency virus [HIV] disease: Secondary | ICD-10-CM

## 2020-06-29 MED ORDER — BICTEGRAVIR-EMTRICITAB-TENOFOV 50-200-25 MG PO TABS: 1.0000 | ORAL_TABLET | Freq: Every day | ORAL | 5 refills | Status: DC

## 2020-06-29 NOTE — Assessment & Plan Note (Signed)
Newly diagnosed and not currently on medication. Recommend follow-up with neurology. We will continue Biktarvy for now with consideration for change to a Atripla if warranted.

## 2020-06-29 NOTE — Assessment & Plan Note (Signed)
Candice [name] continues to have well-controlled HIV disease with good adherence and tolerance to her ART regimen of Biktarvy. No signs/symptoms of opportunistic infection or progressive HIV disease. Newly diagnosed with multiple sclerosis not currently on medication. [name] the possibility of changing her medication to Atripla versus staying on Biktarvy. Given her recent improvement we will continue current dose of Biktarvy tentatively for now. It is unclear if the change to Atripla would make a significant difference with the new form of tenofovir. I also have concern about a Atripla with her mental health as efavirenz has potential for CNS effects and will continue to weigh the risks and benefits. Recommend follow-up with neurology and plan for follow-up in 3 months or sooner if needed.

## 2020-06-29 NOTE — Patient Instructions (Signed)
Nice to speak with you.  We will continue [place] for now with consideration for changing to Atripla.  Recommend following up with neurology.  Plan for follow-up in 3 months or sooner if needed with lab work 1 to 2 weeks prior to her appointment on same day.  Have a great day and stay safe!

## 2020-06-29 NOTE — Progress Notes (Addendum)
Subjective:    Patient ID: [name], female    DOB: October 15, 1985, 35 y.o.   MRN: [id number]  Chief Complaint  Patient presents with  . Follow-up     Virtual Visit via Telephone/Video Note   I connected with Ms. Larrie Kass on 06/29/2020 at  9:45am  by telephone and verified that I am speaking with the correct person using two identifiers.   I discussed the limitations, risks, security and privacy concerns of performing an evaluation and management service by telephone and the availability of in person appointments. I also discussed with the patient that there may be a patient responsible charge related to this service. The patient expressed understanding and agreed to proceed.  Location:  Patient: Home Provider: Clinic   HPI:  DREANNA MEGGITT is a 35 y.o. female with HIV disease last seen on 03/29/2020 with good adherence and tolerance to her ART regimen of Biktarvy. Viral load at the time was undetectable with CD4 count of 1078. In the interim she has been seen by neurology and newly diagnosed with multiple sclerosis. She is not currently on any medications for treatment. Discussed with neurology the possibility of changing medications to Atripla. Presenting for telehealth visit today for follow-up and to discuss further plans.  Ms. Welford continues to take her Biktarvy daily as prescribed with no adverse side effects or missed doses since her last office visit. She continues to have some mild memory issues as well as a swollen foot. Otherwise her symptoms have been improved since being seen by neurology. Denies fevers, chills, night sweats, headaches, changes in vision, neck pain/stiffness, nausea, diarrhea, vomiting, lesions or rashes.  Ms. Pett has no problems obtaining her medication from the pharmacy remains covered through [facility]. She is working through depression with her primary care provider and counselor. She does use marijuana on occasion and  smokes tobacco at a rate of 1/4 pack/day. Alcohol use is occasional.    Allergies  Allergen Reactions  . Contrast Media [Iodinated Diagnostic Agents] Nausea And Vomiting      Outpatient Medications Prior to Visit  Medication Sig Dispense Refill  . cyclobenzaprine (FLEXERIL) 10 MG tablet Take 1 tablet (10 mg total) by mouth 3 (three) times daily as needed for muscle spasms. 30 tablet 0  . naproxen sodium (ALEVE) 220 MG tablet Take 220 mg by mouth daily as needed (For pain).    Marland Kitchen omeprazole (PRILOSEC) 20 MG capsule Take 1 capsule (20 mg total) by mouth daily. 60 capsule 2  . bictegravir-emtricitabine-tenofovir AF (BIKTARVY) 50-200-25 MG TABS tablet Take 1 tablet by mouth daily. 30 tablet 5  . ondansetron (ZOFRAN ODT) 4 MG disintegrating tablet Take 1 tablet (4 mg total) by mouth every 8 (eight) hours as needed for nausea or vomiting. (Patient not taking: No sig reported) 5 tablet 0  . psyllium (METAMUCIL) 58.6 % packet Take 1 packet by mouth daily. (Patient not taking: Reported on 06/29/2020)    . sucralfate (CARAFATE) 1 GM/10ML suspension Take 10 mLs (1 g total) by mouth 4 (four) times daily -  with meals and at bedtime. (Patient not taking: Reported on 06/29/2020) 420 mL 0   No facility-administered medications prior to visit.     Past Medical History:  Diagnosis Date  . Complication of anesthesia    epidural with last pregnancy made entire left side numb, had to d/c  . Depression   . Headache   . HSV 06/28/2007  . Hx of migraines  Past Surgical History:  Procedure Laterality Date  . BREAST SURGERY  2019   reduction  . CESAREAN SECTION    . CESAREAN SECTION  09/07/2011   Procedure: CESAREAN SECTION;  Surgeon: Florian Buff, MD;  Location: [facility];  Service: Gynecology;  Laterality: N/A;  Repeat cesarean section with delivery of baby boy at 52. Apgars 9/9.  Bilateral tubal ligation.  . IUD REMOVAL  2005   in cervix  . TUBAL LIGATION    . tubal reanastomosis  08/13/2016        Review of Systems  Constitutional: Negative for appetite change, chills, diaphoresis, fatigue, fever and unexpected weight change.  Eyes:       Negative for acute change in vision  Respiratory: Negative for chest tightness, shortness of breath and wheezing.   Cardiovascular: Positive for leg swelling. Negative for chest pain.  Gastrointestinal: Negative for diarrhea, nausea and vomiting.  Genitourinary: Negative for dysuria, pelvic pain and vaginal discharge.  Musculoskeletal: Negative for neck pain and neck stiffness.  Skin: Negative for rash.  Neurological: Negative for seizures, syncope, weakness and headaches.       Positive for memory issues  Hematological: Negative for adenopathy. Does not bruise/bleed easily.  Psychiatric/Behavioral: Negative for hallucinations.      Objective:    Nursing note and vital signs reviewed.    Ms. Birt is pleasant to speak with and sounds to be doing ok. Physical exam otherwise limited secondary to telehealth visit.  Assessment & Plan:   Problem List Items Addressed This Visit      Nervous and Auditory   MS (multiple sclerosis) ([place])    Newly diagnosed and not currently on medication. Recommend follow-up with neurology. We will continue Biktarvy for now with consideration for change to a Atripla if warranted.        Other   HIV (human immunodeficiency virus infection) ([place])    Ms. Gathright continues to have well-controlled HIV disease with good adherence and tolerance to her ART regimen of Biktarvy. No signs/symptoms of opportunistic infection or progressive HIV disease. Newly diagnosed with multiple sclerosis not currently on medication. [name] the possibility of changing her medication to Atripla versus staying on Biktarvy. Given her recent improvement we will continue current dose of Biktarvy tentatively for now. It is unclear if the change to Atripla would make a significant difference with the new form of tenofovir. I also  have concern about a Atripla with her mental health as efavirenz has potential for CNS effects and will continue to weigh the risks and benefits. Recommend follow-up with neurology and plan for follow-up in 3 months or sooner if needed.      Relevant Medications   bictegravir-emtricitabine-tenofovir AF (BIKTARVY) 50-200-25 MG TABS tablet    Other Visit Diagnoses    Asymptomatic HIV infection ([place])       Relevant Medications   bictegravir-emtricitabine-tenofovir AF (BIKTARVY) 50-200-25 MG TABS tablet       I am having Jadin LThurnell Garbe maintain her omeprazole, naproxen sodium, psyllium, ondansetron, sucralfate, cyclobenzaprine, and bictegravir-emtricitabine-tenofovir AF.   Meds ordered this encounter  Medications  . bictegravir-emtricitabine-tenofovir AF (BIKTARVY) 50-200-25 MG TABS tablet    Sig: Take 1 tablet by mouth daily.    Dispense:  30 tablet    Refill:  5    Order Specific Question:   Supervising Provider    Answer:   Carlyle Basques [phone]     I discussed the assessment and treatment plan with the patient. The patient was provided  an opportunity to ask questions and all were answered. The patient agreed with the plan and demonstrated an understanding of the instructions.   The patient was advised to call back or seek an in-person evaluation if the symptoms worsen or if the condition fails to improve as anticipated.   I provided 13  minutes of non-face-to-face time during this encounter.  Follow-up: Return in about 3 months (around 09/27/2020), or if symptoms worsen or fail to improve.   Terri Piedra, MSN, FNP-C Nurse Practitioner [facility] [place] number: [phone]

## 2020-07-16 ENCOUNTER — Encounter: Payer: Self-pay | Admitting: *Deleted

## 2020-07-16 ENCOUNTER — Encounter: Payer: Self-pay | Admitting: Student in an Organized Health Care Education/Training Program

## 2020-07-18 MED ORDER — PREDNISONE 10 MG PO TABS: ORAL_TABLET | ORAL | 0 refills | Status: DC

## 2020-07-18 NOTE — Telephone Encounter (Signed)
Meds ordered this encounter  Medications  . predniSONE (DELTASONE) 10 MG tablet    Sig: Take 60mg  on day 1. Reduce by 10mg  each subsequent day. (60, 50, 40, 30, 20, 10, stop)    Dispense:  21 tablet    Refill:  0    Penni Bombard, MD 3/54/5625, 6:38 PM Certified in Neurology, Neurophysiology and Neuroimaging  [facility] [address] [place],  [address] [phone]

## 2020-08-13 ENCOUNTER — Encounter: Payer: Self-pay | Admitting: Diagnostic Neuroimaging

## 2020-08-13 ENCOUNTER — Ambulatory Visit (INDEPENDENT_AMBULATORY_CARE_PROVIDER_SITE_OTHER): Payer: BC Managed Care – PPO | Admitting: Diagnostic Neuroimaging

## 2020-08-13 VITALS — BP 109/74 | HR 76 | Ht 63.0 in | Wt 188.4 lb

## 2020-08-13 DIAGNOSIS — Z21 Asymptomatic human immunodeficiency virus [HIV] infection status: Secondary | ICD-10-CM

## 2020-08-13 DIAGNOSIS — G35 Multiple sclerosis: Secondary | ICD-10-CM

## 2020-08-13 NOTE — Progress Notes (Signed)
[facility]  PATIENT: Candice [name] DOB: August 13, 1985  REFERRING CLINICIAN: Doristine Mango L, DO HISTORY FROM: PATIENT  REASON FOR VISIT: Follow-up   HISTORICAL  CHIEF COMPLAINT:  Chief Complaint  Patient presents with  . Multiple Sclerosis    Rm 6 , 3 month FU "prednisone pack helped; my right foot still hurts"    HISTORY OF PRESENT ILLNESS:   UPDATE (08/13/20, VRP): Since last visit, doing about the same. Some intermittent right foot pain (with wearing certain shoes). Other times it feels swollen. HIV stable.   PRIOR HPI: 05/10/20: I called patient to discuss results.  Lumbar puncture demonstrates greater than 5 well-defined oligoclonal bands in the CSF that are not present in the serum.  These findings in conjunction with MRI brain, MRI thoracic spine findings, clinical symptoms, rule out labs, confirm diagnosis of multiple sclerosis.  I asked patient about symptoms and timeframe again.  In summary she has had some neurologic symptoms including numbness and pain in her legs, headaches, dizziness going back several years, possibly 2018 or earlier.  In March 2021 when she was diagnosed with HIV.  She was started on Biktarvy at that time and within a few months developed new onset neurologic symptoms from her waist down to her right leg.  This correlates with a thoracic spine enhancing lesion on MRI, and consistent with multiple sclerosis exacerbation.  I discussed options for long-term treatment.  I would like to discuss further with patient's HIV physician as HIV medication and multiple sclerosis disease modifying therapy treatments may have complementary or opposing effects on the immune system.  I conducted a brief literature review of case reports of concomitant HIV and multiple sclerosis diagnoses, and apparently fewer than 10 cases have been reported in the literature thus far.  In situations where patient developed multiple sclerosis first and later  on developed HIV, HIV diagnosis and treatment seem to confer protective benefit on multiple sclerosis progression.  This is different than our patients clinical course.  It appears that after initiation of Biktarvy, patient developed an exacerbation, possibly as viral load became under control and CD4 counts increased.  - will discuss further with ID clinic about watchful waiting vs changing Biktarvy to [place] (see case report "Multiple Sclerosis and Subsequent Human Immunodeficiency Virus Infection: A Case with the Rare Comorbidity, Focus on Novel Treatment Issues and Review of the Literature" https://www.bradley.[email or web address]/)  - may consider adding on avonex or plegridy (low dose interferon beta-1a)    UPDATE (04/24/20, VRP): Since last visit, symptoms are stable to slightly improved.  MRI of thoracic spine results reviewed.  Patient also MRI of the brain which shows several nonspecific T2 hyperintensities, but raising possibility of multiple sclerosis.  No acute plaques noted in the brain or thoracic spine.  Patient reports several years of intermittent numbness, weakness, dizziness in the past which have never specifically been diagnosed.  Symptoms would last a few weeks at a time then resolved.  These symptoms started prior to her exposure and diagnosis of HIV.  PRIOR HPI: 35 year old female here for evaluation of right leg numbness.  March 2021 patient was diagnosed with HIV and started on antiviral therapy.  In May/June 2021 when she noticed pain and numbness around her right flank region, radiating around to her right hip and right leg.  Her entire right leg feels numb.  She feels some weakness.  She is slightly dragging her leg.  She is noted some increasing swelling in her right leg.  She denies  any rash or shingles outbreak.  At rest she has no pain.  She is sensitive to touch on her skin in the right leg.  She has noticed some slight right arm problems in the last few  weeks.   REVIEW OF SYSTEMS: Full 14 system review of systems performed and negative with exception of: As per HPI.  ALLERGIES: Allergies  Allergen Reactions  . Contrast Media [Iodinated Diagnostic Agents] Nausea And Vomiting    HOME MEDICATIONS: Outpatient Medications Prior to Visit  Medication Sig Dispense Refill  . bictegravir-emtricitabine-tenofovir AF (BIKTARVY) 50-200-25 MG TABS tablet Take 1 tablet by mouth daily. 30 tablet 5  . cyclobenzaprine (FLEXERIL) 10 MG tablet Take 1 tablet (10 mg total) by mouth 3 (three) times daily as needed for muscle spasms. 30 tablet 0  . naproxen sodium (ALEVE) 220 MG tablet Take 220 mg by mouth daily as needed (For pain).    Marland Kitchen omeprazole (PRILOSEC) 20 MG capsule Take 1 capsule (20 mg total) by mouth daily. 60 capsule 2  . psyllium (METAMUCIL) 58.6 % packet Take 1 packet by mouth daily.    . ondansetron (ZOFRAN ODT) 4 MG disintegrating tablet Take 1 tablet (4 mg total) by mouth every 8 (eight) hours as needed for nausea or vomiting. (Patient not taking: No sig reported) 5 tablet 0  . predniSONE (DELTASONE) 10 MG tablet Take 60mg  on day 1. Reduce by 10mg  each subsequent day. (60, 50, 40, 30, 20, 10, stop) 21 tablet 0  . sucralfate (CARAFATE) 1 GM/10ML suspension Take 10 mLs (1 g total) by mouth 4 (four) times daily -  with meals and at bedtime. (Patient not taking: Reported on 06/29/2020) 420 mL 0   No facility-administered medications prior to visit.    PAST MEDICAL HISTORY: Past Medical History:  Diagnosis Date  . Complication of anesthesia    epidural with last pregnancy made entire left side numb, had to d/c  . Depression   . Headache   . HSV 06/28/2007  . Hx of migraines     PAST SURGICAL HISTORY: Past Surgical History:  Procedure Laterality Date  . BREAST SURGERY  2019   reduction  . CESAREAN SECTION    . CESAREAN SECTION  09/07/2011   Procedure: CESAREAN SECTION;  Surgeon: Florian Buff, MD;  Location: [facility];  Service: Gynecology;   Laterality: N/A;  Repeat cesarean section with delivery of baby boy at 34. Apgars 9/9.  Bilateral tubal ligation.  . IUD REMOVAL  2005   in cervix  . TUBAL LIGATION    . tubal reanastomosis  08/13/2016    FAMILY HISTORY: Family History  Problem Relation Age of Onset  . Alcohol abuse Mother   . Drug abuse Mother   . Depression Mother   . Alcohol abuse Father   . Drug abuse Father   . Mental illness Sister        bipolar ptsd  . Schizophrenia Sister   . Diabetes Maternal Grandmother   . Hypertension Maternal Grandmother   . Cancer Maternal Grandmother        ovarian, breast and lung  . Depression Maternal Grandmother     SOCIAL HISTORY: Social History   Socioeconomic History  . Marital status: Single    Spouse name: Not on file  . Number of children: 2  . Years of education: 18  . Highest education level: Not on file  Occupational History    Comment: grocery manager  Tobacco Use  . Smoking status: Current Every Day  Smoker    Packs/day: 0.25    Types: Cigarettes  . Smokeless tobacco: Never Used  Vaping Use  . Vaping Use: Never used  Substance and Sexual Activity  . Alcohol use: Not Currently    Comment: occasional  . Drug use: Yes    Frequency: 3.0 times per week    Types: Marijuana  . Sexual activity: Yes    Partners: Male    Birth control/protection: None    Comment: Decline condoms 03/29/2020  Other Topics Concern  . Not on file  Social History Narrative   Works as Ecologist.   FOB of both children lives in [place]; will be some what involved but mother is no longer dating him.   Social Determinants of Health   Financial Resource Strain: Not on file  Food Insecurity: Not on file  Transportation Needs: Not on file  Physical Activity: Not on file  Stress: Not on file  Social Connections: Not on file  Intimate Partner Violence: Not on file     PHYSICAL EXAM  GENERAL EXAM/CONSTITUTIONAL: Vitals:  Vitals:   08/13/20 1343  BP: 109/74   Pulse: 76  Weight: 188 lb 6.4 oz (85.5 kg)  Height: 5\' 3"  (1.6 m)   Body mass index is 33.37 kg/m. Wt Readings from Last 3 Encounters:  08/13/20 188 lb 6.4 oz (85.5 kg)  05/21/20 186 lb 3.2 oz (84.5 kg)  04/11/20 179 lb (81.2 kg)    Patient is in no distress; well developed, nourished and groomed; neck is supple  CARDIOVASCULAR:  Examination of carotid arteries is normal; no carotid bruits  Regular rate and rhythm, no murmurs  Examination of peripheral vascular system by observation and palpation is normal  EYES:  Ophthalmoscopic exam of optic discs and posterior segments is normal; no papilledema or hemorrhages No exam data present  MUSCULOSKELETAL:  Gait, strength, tone, movements noted in Neurologic exam below  NEUROLOGIC: MENTAL STATUS:  No flowsheet data found.  awake, alert, oriented to person, place and time  recent and remote memory intact  normal attention and concentration  language fluent, comprehension intact, naming intact  fund of knowledge appropriate  CRANIAL NERVE:   2nd - no papilledema on fundoscopic exam  2nd, 3rd, 4th, 6th - pupils equal and reactive to light, visual fields full to confrontation, extraocular muscles intact, no nystagmus  5th - facial sensation symmetric  7th - facial strength symmetric  8th - hearing intact  9th - palate elevates symmetrically, uvula midline  11th - shoulder shrug symmetric  12th - tongue protrusion midline  MOTOR:   normal bulk and tone, full strength in the BUE, BLE  SENSORY:   normal and symmetric to light touch  COORDINATION:   finger-nose-finger, fine finger movements normal  REFLEXES:   deep tendon reflexes present and symmetric  GAIT/STATION:   narrow based gait      DIAGNOSTIC DATA (LABS, IMAGING, TESTING) - I reviewed patient records, labs, notes, testing and imaging myself where available.  Lab Results  Component Value Date   WBC 13.3 (H) 04/11/2020   HGB  12.2 04/11/2020   HCT 38.2 04/11/2020   MCV 88.4 04/11/2020   PLT 310 04/11/2020      Component Value Date/Time   NA 139 04/11/2020 1733   NA 141 04/02/2020 1150   K 3.8 04/11/2020 1733   CL 107 04/11/2020 1733   CO2 22 04/11/2020 1733   GLUCOSE 95 04/11/2020 1733   GLUCOSE 104 06/06/2011 1142   BUN 10 04/11/2020  1733   BUN 10 04/02/2020 1150   CREATININE 0.76 04/11/2020 1733   CREATININE 0.72 03/29/2020 1057   CALCIUM 9.3 04/11/2020 1733   PROT 6.9 04/11/2020 1733   PROT 7.3 04/02/2020 1150   ALBUMIN 3.7 04/11/2020 1733   ALBUMIN 4.5 04/02/2020 1150   AST 13 (L) 04/11/2020 1733   ALT 11 04/11/2020 1733   ALKPHOS 44 04/11/2020 1733   BILITOT 0.4 04/11/2020 1733   BILITOT 0.2 04/02/2020 1150   GFRNONAA >60 04/11/2020 1733   GFRNONAA 108 03/29/2020 1057   GFRAA 119 04/02/2020 1150   GFRAA 125 03/29/2020 1057   Lab Results  Component Value Date   CHOL 166 09/08/2019   HDL 48 (L) 09/08/2019   LDLCALC 102 (H) 09/08/2019   TRIG 70 09/08/2019   CHOLHDL 3.5 09/08/2019   Lab Results  Component Value Date   HGBA1C 5.6 02/13/2020   Lab Results  Component Value Date   VITAMINB12 304 02/13/2020   Lab Results  Component Value Date   TSH 0.741 06/08/2018     02/11/20 MRI lumbar spine [I reviewed images myself and agree with interpretation. -VRP]  - Central disc protrusion at L5-S1, contacting the thecal sac and S1 root sleeves as they bud from the thecal sac. Nerve compression is not demonstrated. This abnormality could  result in nerve irritation. -  Mild facet osteoarthritis at L4-5 and L5-S1 that could contribute to low back pain.  02/25/20 MRI thoracic spine [I reviewed images myself and agree with interpretation. -VRP]  1.   There is a T2 hyperintense focus with subtle enhancement adjacent to T8-T9 in the right posterolateral spinal cord.  Though nonspecific, this is most likely to represent a focus of demyelination or inflammation.  Consider MRI of the brain to  determine if there is evidence of demyelination elsewhere. 2.   No spinal degenerative changes.  04/22/20 MRI brain [I reviewed images myself and agree with interpretation. -VRP]  1. Multiple scattered patchy T2/FLAIR hyperintensities involving the supratentorial cerebral white matter, nonspecific, but most suspicious for possible demyelinating disease/multiple sclerosis. No evidence for active demyelination. 2. Otherwise normal brain MRI for age.  04/23/20 MRI cervical spine - Unremarkable MRI cervical spine (with and without).  No intrinsic, compressive or abnormal enhancing spinal cord lesions.    ASSESSMENT AND PLAN  35 y.o. year old female here with HIV since March 2021, now with numbness and weakness of right leg since June 2021. Also with right lower thoracic numbness, right hip numbness.   Lumbar puncture demonstrates greater than 5 well-defined oligoclonal bands in the CSF that are not present in the serum.  These findings in conjunction with MRI brain, MRI thoracic spine findings, clinical symptoms, rule out labs, confirm diagnosis of multiple sclerosis.  Dx:  1. Relapsing remitting multiple sclerosis ([place])   2. Asymptomatic HIV infection, with no history of HIV-related illness ([place])      PLAN:  Multiple sclerosis  - I discussed with ID clinic about watchful waiting vs changing Biktarvy to [place] (see case report "Multiple Sclerosis and Subsequent Human Immunodeficiency Virus Infection: A Case with the Rare Comorbidity, Focus on Novel Treatment Issues and Review of the Literature" [email or web address].[email or web address]/)  - repeat MRI brain in 6 months; then may consider adding on avonex or plegridy (low dose interferon beta-1a)    HIV  - continue biktarvy   Return in about 6 months (around 02/10/2021).    Penni Bombard, MD 6/96/2952, 8:41 PM Certified in Neurology, Neurophysiology and Neuroimaging  [facility] [address] [place], [place] [address] [phone]

## 2020-10-25 ENCOUNTER — Encounter: Payer: Self-pay | Admitting: Student in an Organized Health Care Education/Training Program

## 2020-10-25 ENCOUNTER — Other Ambulatory Visit: Payer: Self-pay

## 2020-10-25 ENCOUNTER — Ambulatory Visit (INDEPENDENT_AMBULATORY_CARE_PROVIDER_SITE_OTHER): Payer: BC Managed Care – PPO | Admitting: Student in an Organized Health Care Education/Training Program

## 2020-10-25 ENCOUNTER — Other Ambulatory Visit (HOSPITAL_COMMUNITY)
Admission: RE | Admit: 2020-10-25 | Discharge: 2020-10-25 | Disposition: A | Discharge status: Home / Self Care | Payer: BC Managed Care – PPO | Source: Ambulatory Visit | Attending: Family Medicine | Admitting: Family Medicine

## 2020-10-25 VITALS — BP 108/76 | HR 88 | Ht 63.0 in | Wt 184.8 lb

## 2020-10-25 DIAGNOSIS — R1013 Epigastric pain: Secondary | ICD-10-CM | POA: Diagnosis not present

## 2020-10-25 DIAGNOSIS — Z124 Encounter for screening for malignant neoplasm of cervix: Secondary | ICD-10-CM | POA: Insufficient documentation

## 2020-10-25 DIAGNOSIS — Z113 Encounter for screening for infections with a predominantly sexual mode of transmission: Secondary | ICD-10-CM | POA: Insufficient documentation

## 2020-10-25 DIAGNOSIS — A5901 Trichomonal vulvovaginitis: Secondary | ICD-10-CM | POA: Insufficient documentation

## 2020-10-25 NOTE — Patient Instructions (Signed)
It was a pleasure to see you today!  To summarize our discussion for this visit:  I will let you know results of pap smear/STI screen via mychart.   For your abdominal pain:  Continue on with your bowel regimen.   First we can try a gluten elimination diet for 2 weeks and monitor for improvement  If no change, next try a dairy elimination diet for 2 weeks.  Follow up with me if not finding a significant improvement with those and we can reassess. Also, may want to consider that these could be related to medication side effects  Some additional health maintenance measures we should update are: Health Maintenance Due  Topic Date Due  . COVID-19 Vaccine (1) Never done  . PAP SMEAR-Modifier  10/16/2020  .    Please return to our clinic to see me in 1 month.  Call the clinic at [phone] if your symptoms worsen or you have any concerns.   Thank you for allowing me to take part in your care,  Dr. Doristine Mango

## 2020-10-25 NOTE — Progress Notes (Signed)
    SUBJECTIVE:   CHIEF COMPLAINT / HPI:  Pap smear, abdominal discomfort  Health maintenance- pap smear due. Also requesting STI screening.  Abdominal discomfort- associated with nausea x2 months with central abdominal pain and bloating. Similar to episodes that had resolved with constipation regimens. Started back her regimen and had improvement. Not having regular BMs. BMs daily the past week with miralax and prilosec Prior to that was once a week. Onset - off and on since February. Occurs approximately 3x/week. 15-70min duration and goes away on it's own. Stopped drinking sodas. Agitated with caffeine.  No urination issues but increased frequency. Eating more sweets. Denies bloody stool, diarrhea.  Has not noticed any patterns of foods/activities that precipitate the pain.  No recent changes in medications.  OBJECTIVE:   BP 108/76   Pulse 88   Ht 5\' 3"  (1.6 m)   Wt 184 lb 12.8 oz (83.8 kg)   LMP 10/15/2020   SpO2 98%   BMI 32.74 kg/m   Physical Exam Vitals and nursing note reviewed. Exam conducted with a chaperone present.  Constitutional:      General: She is not in acute distress.    Appearance: Normal appearance. She is normal weight. She is not ill-appearing or toxic-appearing.  Eyes:     Conjunctiva/sclera: Conjunctivae normal.  Pulmonary:     Effort: Pulmonary effort is normal.  Abdominal:     General: Abdomen is flat. Bowel sounds are normal. There is no distension.     Palpations: Abdomen is soft. There is no mass.     Tenderness: There is abdominal tenderness (epigastric). There is no guarding or rebound.     Hernia: No hernia is present.  Genitourinary:    General: Normal vulva.     Exam position: Lithotomy position.     Vagina: Normal.     Adnexa:        Right: No mass, tenderness or fullness.         Left: No mass, tenderness or fullness.    Skin:    Findings: No rash.  Neurological:     General: No focal deficit present.     Mental Status: She is  alert and oriented to person, place, and time.  Psychiatric:        Mood and Affect: Mood normal.      ASSESSMENT/PLAN:   Cervical cancer screening Pap with HPV cotesting collected today as well as STI screening including RPR. - repeat pap with cotesting in 3 years if normal  Abdominal pain Pain has improved with bowel regimen.  Complex history makes differential large- constipation, medication side-effect, malabsorption, IBS. Without acute exam and patient having improvement with bowel regimen, she elected to do diet trials - first elimination of gluten trial and if not improved, will try dairy elimination, 2 weeks each. - return sooner if [place]

## 2020-10-26 LAB — RPR: RPR Ser Ql: NONREACTIVE

## 2020-10-27 NOTE — Assessment & Plan Note (Signed)
Pap with HPV cotesting collected today as well as STI screening including RPR. - repeat pap with cotesting in 3 years if normal

## 2020-10-27 NOTE — Assessment & Plan Note (Signed)
Pain has improved with bowel regimen.  Complex history makes differential large- constipation, medication side-effect, malabsorption, IBS. Without acute exam and patient having improvement with bowel regimen, she elected to do diet trials - first elimination of gluten trial and if not improved, will try dairy elimination, 2 weeks each. - return sooner if acute

## 2020-10-29 ENCOUNTER — Other Ambulatory Visit: Payer: Self-pay | Admitting: Student in an Organized Health Care Education/Training Program

## 2020-10-29 LAB — CYTOLOGY - PAP
Chlamydia: NEGATIVE
Comment: NEGATIVE
Comment: NEGATIVE
Comment: NEGATIVE
Comment: NORMAL
Diagnosis: NEGATIVE
High risk HPV: NEGATIVE
Neisseria Gonorrhea: NEGATIVE
Trichomonas: POSITIVE — AB

## 2020-10-29 MED ORDER — METRONIDAZOLE 500 MG PO TABS: 500.0000 mg | ORAL_TABLET | Freq: Two times a day (BID) | ORAL | 0 refills | Status: AC

## 2020-10-29 NOTE — Progress Notes (Signed)
Flagyl treatment sent in for trichomonas. Patient notified via mychart

## 2020-11-21 ENCOUNTER — Other Ambulatory Visit: Payer: Self-pay

## 2020-11-21 DIAGNOSIS — Z113 Encounter for screening for infections with a predominantly sexual mode of transmission: Secondary | ICD-10-CM

## 2020-11-21 DIAGNOSIS — Z79899 Other long term (current) drug therapy: Secondary | ICD-10-CM

## 2020-11-21 DIAGNOSIS — Z21 Asymptomatic human immunodeficiency virus [HIV] infection status: Secondary | ICD-10-CM

## 2020-11-22 ENCOUNTER — Other Ambulatory Visit (HOSPITAL_COMMUNITY)
Admission: RE | Admit: 2020-11-22 | Discharge: 2020-11-22 | Disposition: A | Discharge status: Home / Self Care | Payer: BC Managed Care – PPO | Source: Ambulatory Visit | Attending: Family | Admitting: Family

## 2020-11-22 ENCOUNTER — Other Ambulatory Visit: Payer: BC Managed Care – PPO

## 2020-11-22 ENCOUNTER — Other Ambulatory Visit: Payer: Self-pay

## 2020-11-22 DIAGNOSIS — Z21 Asymptomatic human immunodeficiency virus [HIV] infection status: Secondary | ICD-10-CM | POA: Diagnosis not present

## 2020-11-22 DIAGNOSIS — Z113 Encounter for screening for infections with a predominantly sexual mode of transmission: Secondary | ICD-10-CM

## 2020-11-22 DIAGNOSIS — Z79899 Other long term (current) drug therapy: Secondary | ICD-10-CM

## 2020-11-23 LAB — T-HELPER CELL (CD4) - (RCID CLINIC ONLY)
CD4 % Helper T Cell: 45 % (ref 33–65)
CD4 T Cell Abs: 1026 /uL (ref 400–1790)

## 2020-11-23 LAB — URINE CYTOLOGY ANCILLARY ONLY
Chlamydia: NEGATIVE
Comment: NEGATIVE
Comment: NORMAL
Neisseria Gonorrhea: NEGATIVE

## 2020-11-25 LAB — CBC WITH DIFFERENTIAL/PLATELET
Absolute Monocytes: 988 cells/uL — ABNORMAL HIGH (ref 200–950)
Basophils Absolute: 46 cells/uL (ref 0–200)
Basophils Relative: 0.3 %
Eosinophils Absolute: 274 cells/uL (ref 15–500)
Eosinophils Relative: 1.8 %
HCT: 39.9 % (ref 35.0–45.0)
Hemoglobin: 12.8 g/dL (ref 11.7–15.5)
Lymphs Abs: 2569 cells/uL (ref 850–3900)
MCH: 28.1 pg (ref 27.0–33.0)
MCHC: 32.1 g/dL (ref 32.0–36.0)
MCV: 87.7 fL (ref 80.0–100.0)
MPV: 9.5 fL (ref 7.5–12.5)
Monocytes Relative: 6.5 %
Neutro Abs: 11324 cells/uL — ABNORMAL HIGH (ref 1500–7800)
Neutrophils Relative %: 74.5 %
Platelets: 291 10*3/uL (ref 140–400)
RBC: 4.55 10*6/uL (ref 3.80–5.10)
RDW: 14.3 % (ref 11.0–15.0)
Total Lymphocyte: 16.9 %
WBC: 15.2 10*3/uL — ABNORMAL HIGH (ref 3.8–10.8)

## 2020-11-25 LAB — COMPLETE METABOLIC PANEL WITH GFR
AG Ratio: 1.5 (calc) (ref 1.0–2.5)
ALT: 10 U/L (ref 6–29)
AST: 11 U/L (ref 10–30)
Albumin: 4.1 g/dL (ref 3.6–5.1)
Alkaline phosphatase (APISO): 50 U/L (ref 31–125)
BUN: 14 mg/dL (ref 7–25)
CO2: 24 mmol/L (ref 20–32)
Calcium: 9.2 mg/dL (ref 8.6–10.2)
Chloride: 106 mmol/L (ref 98–110)
Creat: 0.98 mg/dL (ref 0.50–1.10)
GFR, Est African American: 86 mL/min/{1.73_m2} (ref >=60)
GFR, Est Non African American: 74 mL/min/{1.73_m2} (ref >=60)
Globulin: 2.7 g/dL (calc) (ref 1.9–3.7)
Glucose, Bld: 94 mg/dL (ref 65–99)
Potassium: 3.8 mmol/L (ref 3.5–5.3)
Sodium: 139 mmol/L (ref 135–146)
Total Bilirubin: 0.2 mg/dL (ref 0.2–1.2)
Total Protein: 6.8 g/dL (ref 6.1–8.1)

## 2020-11-25 LAB — LIPID PANEL
Cholesterol: 183 mg/dL (ref <200)
HDL: 51 mg/dL (ref >=50)
LDL Cholesterol (Calc): 115 mg/dL (calc) — ABNORMAL HIGH
Non-HDL Cholesterol (Calc): 132 mg/dL (calc) — ABNORMAL HIGH (ref <130)
Total CHOL/HDL Ratio: 3.6 (calc) (ref <5.0)
Triglycerides: 80 mg/dL (ref <150)

## 2020-11-25 LAB — HIV-1 RNA QUANT-NO REFLEX-BLD
HIV 1 RNA Quant: NOT DETECTED Copies/mL
HIV-1 RNA Quant, Log: NOT DETECTED Log cps/mL

## 2020-12-04 ENCOUNTER — Ambulatory Visit (INDEPENDENT_AMBULATORY_CARE_PROVIDER_SITE_OTHER): Payer: BC Managed Care – PPO | Admitting: Family

## 2020-12-04 ENCOUNTER — Telehealth: Payer: Self-pay

## 2020-12-04 ENCOUNTER — Other Ambulatory Visit (HOSPITAL_COMMUNITY): Payer: Self-pay

## 2020-12-04 ENCOUNTER — Encounter: Payer: Self-pay | Admitting: Family

## 2020-12-04 ENCOUNTER — Other Ambulatory Visit: Payer: Self-pay

## 2020-12-04 VITALS — BP 121/84 | HR 77 | Temp 98.5°F | Wt 185.0 lb

## 2020-12-04 DIAGNOSIS — Z21 Asymptomatic human immunodeficiency virus [HIV] infection status: Secondary | ICD-10-CM

## 2020-12-04 MED ORDER — CABOTEGRAVIR SODIUM 30 MG PO TABS: 30.0000 mg | ORAL_TABLET | Freq: Every day | ORAL | 0 refills | Status: DC

## 2020-12-04 MED ORDER — RILPIVIRINE HCL 25 MG PO TABS: 25.0000 mg | ORAL_TABLET | Freq: Every day | ORAL | 0 refills | Status: DC

## 2020-12-04 NOTE — Progress Notes (Signed)
Brief Narrative   Patient ID: Candice [name], female    DOB: January 25, 1986, 35 y.o.   MRN: [id number]    Subjective:    Chief Complaint  Patient presents with   Follow-up    Discuss starting  ingestion , discuss current medication  upsetting stomach  causing , bloating,Constipation     HPI:  Candice [name] is a 35 y.o. female with HIV disease last seen through telehealth visit on 06/29/2020 with well-controlled virus and good adherence and tolerance to her ART regimen of Biktarvy.  Viral load at the time was undetectable with CD4 count of 1078.  Most recent blood work completed on 11/23/2018 with viral load remains undetectable and CD4 count of 1026.  Here today for routine follow-up.  Candice [name] continues to take her Biktarvy daily as prescribed.  She has been having continued bloating and constipation with concern that Biktarvy may be causing her stomach issues.  Interested in talking about injectable medications. Denies fevers, chills, night sweats, headaches, changes in vision, neck pain/stiffness, nausea, diarrhea, vomiting, lesions or rashes.  Candice [name] has no problems obtaining medication from the pharmacy and remains covered through [facility].  Denies feelings of being down, depressed, or hopeless recently.  No current alcohol intake with occasional marijuana and every day tobacco use a rate of 1/4 pack of cigarettes per day.    Allergies  Allergen Reactions   Contrast Media [Iodinated Diagnostic Agents] Nausea And Vomiting      Outpatient Medications Prior to Visit  Medication Sig Dispense Refill   cyclobenzaprine (FLEXERIL) 10 MG tablet Take 1 tablet (10 mg total) by mouth 3 (three) times daily as needed for muscle spasms. 30 tablet 0   naproxen sodium (ALEVE) 220 MG tablet Take 220 mg by mouth daily as needed (For pain).     psyllium (METAMUCIL) 58.6 % packet Take 1 packet by mouth daily.     bictegravir-emtricitabine-tenofovir AF (BIKTARVY)  50-200-25 MG TABS tablet Take 1 tablet by mouth daily. 30 tablet 5   omeprazole (PRILOSEC) 20 MG capsule Take 1 capsule (20 mg total) by mouth daily. 60 capsule 2   No facility-administered medications prior to visit.     Past Medical History:  Diagnosis Date   Complication of anesthesia    epidural with last pregnancy made entire left side numb, had to d/c   Depression    Headache    HSV 06/28/2007   Hx of migraines      Past Surgical History:  Procedure Laterality Date   BREAST SURGERY  2019   reduction   CESAREAN SECTION     CESAREAN SECTION  09/07/2011   Procedure: CESAREAN SECTION;  Surgeon: Florian Buff, MD;  Location: [facility];  Service: Gynecology;  Laterality: N/A;  Repeat cesarean section with delivery of baby boy at 86. Apgars 9/9.  Bilateral tubal ligation.   IUD REMOVAL  2005   in cervix   TUBAL LIGATION     tubal reanastomosis  08/13/2016       Review of Systems  Constitutional:  Negative for appetite change, chills, diaphoresis, fatigue, fever and unexpected weight change.  Eyes:        Negative for acute change in vision  Respiratory:  Negative for chest tightness, shortness of breath and wheezing.   Cardiovascular:  Negative for chest pain.  Gastrointestinal:  Negative for diarrhea, nausea and vomiting.  Genitourinary:  Negative for dysuria, pelvic pain and vaginal discharge.  Musculoskeletal:  Negative for  neck pain and neck stiffness.  Skin:  Negative for rash.  Neurological:  Negative for seizures, syncope, weakness and headaches.  Hematological:  Negative for adenopathy. Does not bruise/bleed easily.  Psychiatric/Behavioral:  Negative for hallucinations.      Objective:    BP 121/84   Pulse 77   Temp 98.5 F (36.9 C)   Wt 185 lb (83.9 kg)   LMP 11/11/2020   SpO2 99%   BMI 32.77 kg/m  Nursing note and vital signs reviewed.  Physical Exam Constitutional:      General: She is not in acute distress.    Appearance: She is well-developed.   Eyes:     Conjunctiva/sclera: Conjunctivae normal.  Cardiovascular:     Rate and Rhythm: Normal rate and regular rhythm.     Heart sounds: Normal heart sounds. No murmur heard.   No friction rub. No gallop.  Pulmonary:     Effort: Pulmonary effort is normal. No respiratory distress.     Breath sounds: Normal breath sounds. No wheezing or rales.  Chest:     Chest wall: No tenderness.  Abdominal:     General: Bowel sounds are normal.     Palpations: Abdomen is soft.     Tenderness: There is no abdominal tenderness.  Musculoskeletal:     Cervical back: Neck supple.  Lymphadenopathy:     Cervical: No cervical adenopathy.  Skin:    General: Skin is warm and dry.     Findings: No rash.  Neurological:     Mental Status: She is alert and oriented to person, place, and time.  Psychiatric:        Behavior: Behavior normal.        Thought Content: Thought content normal.        Judgment: Judgment normal.     Depression screen [facility] 2/9 12/04/2020 10/25/2020 06/29/2020 05/21/2020 04/02/2020  Decreased Interest 0 0 2 2 1   Down, Depressed, Hopeless 0 0 2 2 1   PHQ - 2 Score 0 0 4 4 2   Altered sleeping - 1 3 3 1   Tired, decreased energy - 1 3 3 1   Change in appetite - 1 3 2 1   Feeling bad or failure about yourself  - 0 1 0 0  Trouble concentrating - 1 2 2  0  Moving slowly or fidgety/restless - 0 2 1 0  Suicidal thoughts - 0 0 0 0  PHQ-9 Score - 4 18 15 5   Difficult doing work/chores - - Very difficult - -  Some recent data might be hidden       Assessment & Plan:    Patient Active Problem List   Diagnosis Date Noted   MS (multiple sclerosis) ([place]) 05/21/2020   GERD (gastroesophageal reflux disease) 05/21/2020   Right-sided abdominal pain of unknown etiology 04/06/2020   Abnormal MRI, spine 04/06/2020   Metatarsalgia of right foot 04/06/2020   Right leg numbness 02/07/2020   HIV (human immunodeficiency virus infection) ([place]) 09/07/2019   Hemorrhagic cyst of left ovary 10/14/2016    Abdominal pain 04/16/2016   MRSA colonization 09/25/2014   Major depressive disorder, recurrent episode, moderate ([place]) 06/08/2013   Genital herpes 02/16/2013   Cervical cancer screening 10/10/2010   OBESITY, NOS 08/20/2006     Problem List Items Addressed This Visit       Other   HIV (human immunodeficiency virus infection) ([place]) - Primary    Ms. Zachery continues to have well-controlled HIV disease with good adherence and tolerance to her  ART regimen of Biktarvy.  There is a slight possibility that the [place] Odor is contributing to her gastrointestinal problems.  We discussed in detail including the risks, benefits, and financial options for changing to long-term injectable medications.  She will transition to oral cabotegravir and rilpivirine prior to starting the injections.  Advised to withhold omeprazole or antacids at the time of taking rilpivirine orally.  Plan for follow-up pending medication availability and starting oral lead-in for either every 2 month dosing or every month dosing of Cabenuva.        Relevant Medications   Cabotegravir Sodium 30 MG TABS   rilpivirine (EDURANT) 25 MG TABS tablet     I have discontinued Faith L. Maina's omeprazole and bictegravir-emtricitabine-tenofovir AF. I am also having her start on Cabotegravir Sodium and rilpivirine. Additionally, I am having her maintain her naproxen sodium, psyllium, and cyclobenzaprine.   Meds ordered this encounter  Medications   Cabotegravir Sodium 30 MG TABS    Sig: Take 30 mg by mouth daily.    Dispense:  30 tablet    Refill:  0    Order Specific Question:   Supervising Provider    Answer:   Baxter Flattery, CYNTHIA [[id number]]   rilpivirine (EDURANT) 25 MG TABS tablet    Sig: Take 1 tablet (25 mg total) by mouth daily with breakfast.    Dispense:  30 tablet    Refill:  0    Order Specific Question:   Supervising Provider    Answer:   Carlyle Basques [[id number]]     Follow-up: For start of oral lead in   Terri Piedra, MSN, [place] for [place] number: [phone]

## 2020-12-04 NOTE — Patient Instructions (Signed)
Nice to see you.  Continue to take your Biktarvy daily until you receive your oral medications from [facility].   Plan for follow up pending oral medications.  Have a great day and stay safe!

## 2020-12-04 NOTE — Assessment & Plan Note (Signed)
Candice [name] continues to have well-controlled HIV disease with good adherence and tolerance to her ART regimen of Biktarvy.  There is a slight possibility that the [place] Odor is contributing to her gastrointestinal problems.  We discussed in detail including the risks, benefits, and financial options for changing to long-term injectable medications.  She will transition to oral cabotegravir and rilpivirine prior to starting the injections.  Advised to withhold omeprazole or antacids at the time of taking rilpivirine orally.  Plan for follow-up pending medication availability and starting oral lead-in for either every 2 month dosing or every month dosing of Cabenuva.

## 2020-12-04 NOTE — Telephone Encounter (Signed)
[name]  Patient Medical Benefits will cover [place] [facility] .  Patient will have to contact her insurance to find out what is her office visit copay.   Ileene Patrick, [facility] Patient [facility] Phone: [phone] Fax:  [phone]

## 2020-12-05 ENCOUNTER — Other Ambulatory Visit (HOSPITAL_COMMUNITY): Payer: Self-pay

## 2020-12-11 ENCOUNTER — Telehealth: Payer: Self-pay

## 2020-12-11 NOTE — Telephone Encounter (Signed)
[name]  Patient's medications have been couriered to [facility] from [facility] and will be picked up .    Ileene Patrick , [facility] Patient [facility] for [facility] Phone: [phone] Fax:  [phone]

## 2020-12-12 ENCOUNTER — Telehealth: Payer: Self-pay

## 2020-12-12 ENCOUNTER — Other Ambulatory Visit: Payer: Self-pay

## 2020-12-12 ENCOUNTER — Ambulatory Visit (INDEPENDENT_AMBULATORY_CARE_PROVIDER_SITE_OTHER): Payer: BC Managed Care – PPO | Admitting: Pharmacist

## 2020-12-12 DIAGNOSIS — Z7189 Other specified counseling: Secondary | ICD-10-CM

## 2020-12-12 DIAGNOSIS — B20 Human immunodeficiency virus [HIV] disease: Secondary | ICD-10-CM | POA: Diagnosis not present

## 2020-12-12 MED ORDER — CABOTEGRAVIR & RILPIVIRINE ER 600 & 900 MG/3ML IM SUER: 1.0000 | INTRAMUSCULAR | 1 refills | Status: DC

## 2020-12-12 NOTE — Telephone Encounter (Signed)
Medication Samples have been provided to the patient.  Drug name: Biktarvy        Strength: 50/200/25 mg       Qty: 7   LOT: [id number]   Exp.Date: 06/24  Dosing instructions: Take one tablet by mouth once daily  Ileene Patrick, [facility] Patient [facility] Phone: [phone] Fax:  [phone]

## 2020-12-12 NOTE — Progress Notes (Signed)
HPI: Candice [name] is a 35 y.o. female who presents to the [place] clinic for HIV follow-up.  Patient Active Problem List   Diagnosis Date Noted   MS (multiple sclerosis) ([place]) 05/21/2020   GERD (gastroesophageal reflux disease) 05/21/2020   Right-sided abdominal pain of unknown etiology 04/06/2020   Abnormal MRI, spine 04/06/2020   Metatarsalgia of right foot 04/06/2020   Right leg numbness 02/07/2020   HIV (human immunodeficiency virus infection) ([place]) 09/07/2019   Hemorrhagic cyst of left ovary 10/14/2016   Abdominal pain 04/16/2016   MRSA colonization 09/25/2014   Major depressive disorder, recurrent episode, moderate ([place]) 06/08/2013   Genital herpes 02/16/2013   Cervical cancer screening 10/10/2010   OBESITY, NOS 08/20/2006    Patient's Medications  New Prescriptions   No medications on file  Previous Medications   CABOTEGRAVIR SODIUM 30 MG TABS    Take 30 mg by mouth daily.   CYCLOBENZAPRINE (FLEXERIL) 10 MG TABLET    Take 1 tablet (10 mg total) by mouth 3 (three) times daily as needed for muscle spasms.   NAPROXEN SODIUM (ALEVE) 220 MG TABLET    Take 220 mg by mouth daily as needed (For pain).   PSYLLIUM (METAMUCIL) 58.6 % PACKET    Take 1 packet by mouth daily.   RILPIVIRINE (EDURANT) 25 MG TABS TABLET    Take 1 tablet (25 mg total) by mouth daily with breakfast.  Modified Medications   No medications on file  Discontinued Medications   No medications on file    Allergies: Allergies  Allergen Reactions   Contrast Media [Iodinated Diagnostic Agents] Nausea And Vomiting    Past Medical History: Past Medical History:  Diagnosis Date   Complication of anesthesia    epidural with last pregnancy made entire left side numb, had to d/c   Depression    Headache    HSV 06/28/2007   Hx of migraines     Social History: Social History   Socioeconomic History   Marital status: Single    Spouse name: Not on file   Number of children: 2   Years of  education: 14   Highest education level: Not on file  Occupational History    Comment: Ecologist  Tobacco Use   Smoking status: Every Day    Packs/day: 0.25    Pack years: 0.00    Types: Cigarettes   Smokeless tobacco: Never  Vaping Use   Vaping Use: Never used  Substance and Sexual Activity   Alcohol use: Not Currently    Comment: occasional   Drug use: Yes    Frequency: 3.0 times per week    Types: Marijuana   Sexual activity: Yes    Partners: Male    Birth control/protection: None    Comment: Decline condoms 03/29/2020  Other Topics Concern   Not on file  Social History Narrative   Works as Ecologist.   FOB of both children lives in [place]; will be some what involved but mother is no longer dating him.   Social Determinants of Health   Financial Resource Strain: Not on file  Food Insecurity: Not on file  Transportation Needs: Not on file  Physical Activity: Not on file  Stress: Not on file  Social Connections: Not on file    Labs: Lab Results  Component Value Date   HIV1RNAQUANT Not Detected 11/22/2020   HIV1RNAQUANT <20 03/29/2020   HIV1RNAQUANT <20 02/10/2020   CD4TABS 1,026 11/22/2020   CD4TABS 1,078 03/29/2020  CD4TABS 1,233 02/10/2020    RPR and STI Lab Results  Component Value Date   LABRPR Non Reactive 10/25/2020   LABRPR NON-REACTIVE 09/08/2019   LABRPR Non Reactive 09/05/2019   LABRPR Non Reactive 10/16/2017   LABRPR NON REAC 07/13/2015    STI Results GC CT  11/22/2020 Negative Negative  10/25/2020 Negative Negative  09/05/2019 Negative Negative  10/16/2017 Negative Negative  06/08/2017 Negative Negative  10/14/2016 Negative Negative  02/15/2016 Negative Negative  07/13/2015 Negative Negative  04/13/2015 Negative Negative  09/24/2013 NG: Negative CT: Negative  02/11/2011 - NEGATIVE  01/29/2010 - NEGATIVE  05/02/2009 - NEGATIVE  09/29/2008 - NEGATIVE  10/12/2007 - NEGATIVE  07/01/2007 - NEGATIVE  12/30/2006 - NEGATIVE    Hepatitis  B Lab Results  Component Value Date   HEPBSAB REACTIVE (A) 09/08/2019   HEPBSAG NON-REACTIVE 09/08/2019   HEPBCAB NON-REACTIVE 09/08/2019   Hepatitis C Lab Results  Component Value Date   HEPCAB NON-REACTIVE 09/08/2019   Hepatitis A Lab Results  Component Value Date   HAV BORDERLINE (A) 09/08/2019   Lipids: Lab Results  Component Value Date   CHOL 183 11/22/2020   TRIG 80 11/22/2020   HDL 51 11/22/2020   CHOLHDL 3.6 11/22/2020   VLDL 10 02/14/2013   LDLCALC 115 (H) 11/22/2020    Current HIV Regimen: Biktarvy >> switching to [place] via oral lead in   Assessment: Candice [name] presents to start oral lead in therapy with cabotegravir/rilpvirine for 1 month then will start Cabenuva injections. Discussed Cabenuva dosing, scheduling, and administration. Provided patient with oral cabotegravir and rilpivirine from [facility] in office today. Educated her to take 1 tablet of each, once daily and together at the same time. Do not take one without the other. Counseled that rilpivirine must be taken with food for absorption, so she needs to eat a meal before taking the medications. Take at the same time from day to day. Counseled on potential adverse effects such as headache, nausea, insomnia.   She would like to start her Cabenuva injections the week after her birthday, so she will continue Biktarvy through 6/26, then start oral cabotegravir & rilpivirine on 6/27. Her first injection is scheduled on 7/25 (28 days later) with [facility] [place]. She only has 1 tablet of Biktarvy remaining, so we provided her with 1 week of samples to last her to the oral lead in start.   Plan: -Continue Biktarvy through 6/26 -Start oral cabotegravir and rilpivirine (1 tab daily of each) on 6/27 -Education provided on medication administration and adverse effects as above -First injection appt scheduled 7/25  Rebbeca Paul, PharmD PGY1 Pharmacy Resident 12/12/2020 11:48 AM

## 2020-12-17 ENCOUNTER — Encounter: Payer: Self-pay | Admitting: Pharmacist

## 2020-12-21 ENCOUNTER — Emergency Department (HOSPITAL_COMMUNITY)
Admission: EM | Admit: 2020-12-21 | Discharge: 2020-12-21 | Disposition: A | Discharge status: Home / Self Care | Payer: BC Managed Care – PPO | Attending: Emergency Medicine | Admitting: Emergency Medicine

## 2020-12-21 ENCOUNTER — Encounter (HOSPITAL_COMMUNITY): Payer: Self-pay

## 2020-12-21 ENCOUNTER — Emergency Department (HOSPITAL_COMMUNITY): Payer: BC Managed Care – PPO

## 2020-12-21 DIAGNOSIS — Z21 Asymptomatic human immunodeficiency virus [HIV] infection status: Secondary | ICD-10-CM | POA: Diagnosis not present

## 2020-12-21 DIAGNOSIS — R1031 Right lower quadrant pain: Secondary | ICD-10-CM | POA: Diagnosis not present

## 2020-12-21 DIAGNOSIS — R102 Pelvic and perineal pain: Secondary | ICD-10-CM | POA: Diagnosis not present

## 2020-12-21 DIAGNOSIS — F17210 Nicotine dependence, cigarettes, uncomplicated: Secondary | ICD-10-CM | POA: Diagnosis not present

## 2020-12-21 DIAGNOSIS — R109 Unspecified abdominal pain: Secondary | ICD-10-CM

## 2020-12-21 LAB — COMPREHENSIVE METABOLIC PANEL
ALT: 12 U/L (ref 0–44)
AST: 14 U/L — ABNORMAL LOW (ref 15–41)
Albumin: 4.1 g/dL (ref 3.5–5.0)
Alkaline Phosphatase: 42 U/L (ref 38–126)
Anion gap: 4 — ABNORMAL LOW (ref 5–15)
BUN: 9 mg/dL (ref 6–20)
CO2: 26 mmol/L (ref 22–32)
Calcium: 9 mg/dL (ref 8.9–10.3)
Chloride: 107 mmol/L (ref 98–111)
Creatinine, Ser: 0.59 mg/dL (ref 0.44–1.00)
GFR, Estimated: >60 mL/min (ref >60)
Glucose, Bld: 103 mg/dL — ABNORMAL HIGH (ref 70–99)
Potassium: 3.5 mmol/L (ref 3.5–5.1)
Sodium: 137 mmol/L (ref 135–145)
Total Bilirubin: 0.8 mg/dL (ref 0.3–1.2)
Total Protein: 7.2 g/dL (ref 6.5–8.1)

## 2020-12-21 LAB — URINALYSIS, ROUTINE W REFLEX MICROSCOPIC
Bilirubin Urine: NEGATIVE
Glucose, UA: NEGATIVE mg/dL
Hgb urine dipstick: NEGATIVE
Ketones, ur: NEGATIVE mg/dL
Leukocytes,Ua: NEGATIVE
Nitrite: NEGATIVE
Protein, ur: NEGATIVE mg/dL
Specific Gravity, Urine: 1.018 (ref 1.005–1.030)
pH: 7 (ref 5.0–8.0)

## 2020-12-21 LAB — CBC
HCT: 39.4 % (ref 36.0–46.0)
Hemoglobin: 12.5 g/dL (ref 12.0–15.0)
MCH: 27.8 pg (ref 26.0–34.0)
MCHC: 31.7 g/dL (ref 30.0–36.0)
MCV: 87.8 fL (ref 80.0–100.0)
Platelets: 300 10*3/uL (ref 150–400)
RBC: 4.49 MIL/uL (ref 3.87–5.11)
RDW: 15.9 % — ABNORMAL HIGH (ref 11.5–15.5)
WBC: 11.5 10*3/uL — ABNORMAL HIGH (ref 4.0–10.5)
nRBC: 0 % (ref 0.0–0.2)

## 2020-12-21 LAB — WET PREP, GENITAL
Clue Cells Wet Prep HPF POC: NONE SEEN
Sperm: NONE SEEN
Trich, Wet Prep: NONE SEEN
WBC, Wet Prep HPF POC: NONE SEEN
Yeast Wet Prep HPF POC: NONE SEEN

## 2020-12-21 LAB — HCG, QUANTITATIVE, PREGNANCY: hCG, Beta Chain, Quant, S: <1 m[IU]/mL (ref <5)

## 2020-12-21 LAB — LIPASE, BLOOD: Lipase: 60 U/L — ABNORMAL HIGH (ref 11–51)

## 2020-12-21 MED ORDER — OXYCODONE-ACETAMINOPHEN 5-325 MG PO TABS
1.0000 | ORAL_TABLET | Freq: Once | ORAL | Status: AC
  Administered 2020-12-21: 1 via ORAL
  Filled 2020-12-21: qty 1

## 2020-12-21 NOTE — ED Provider Notes (Signed)
[facility]-EMERGENCY DEPT Provider Note   CSN: [id number] Arrival date & time: 12/21/20  1301     History Chief Complaint  Patient presents with   Flank Pain    right    Candice [name] is a 35 y.o. female.  Presents for right flank pain.  Patient states pain ongoing for last 4 to 5 days, since Monday.  Relatively constant, seems to be moving her right lower abdomen now.  No pelvic pain, no new vaginal discharge.  Did recently have intercourse, partner's condom ripped but it was replaced.  She is unaware of any specific STD exposures.  States that ever since being given the diagnosis of HIV has had very little sexual contacts.  Would like to be checked for STDs.  No dysuria or hematuria.  HPI     Past Medical History:  Diagnosis Date   Complication of anesthesia    epidural with last pregnancy made entire left side numb, had to d/c   Depression    Headache    HSV 06/28/2007   Hx of migraines     Patient Active Problem List   Diagnosis Date Noted   MS (multiple sclerosis) (HCC) 05/21/2020   GERD (gastroesophageal reflux disease) 05/21/2020   Right-sided abdominal pain of unknown etiology 04/06/2020   Abnormal MRI, spine 04/06/2020   Metatarsalgia of right foot 04/06/2020   Right leg numbness 02/07/2020   HIV (human immunodeficiency virus infection) (HCC) 09/07/2019   Hemorrhagic cyst of left ovary 10/14/2016   Abdominal pain 04/16/2016   MRSA colonization 09/25/2014   Major depressive disorder, recurrent episode, moderate (HCC) 06/08/2013   Genital herpes 02/16/2013   Cervical cancer screening 10/10/2010   OBESITY, NOS 08/20/2006    Past Surgical History:  Procedure Laterality Date   BREAST SURGERY  2019   reduction   CESAREAN SECTION     CESAREAN SECTION  09/07/2011   Procedure: CESAREAN SECTION;  Surgeon: Lazaro Arms, MD;  Location: [facility];  Service: Gynecology;  Laterality: N/A;  Repeat cesarean section with delivery of baby boy at 16.  Apgars 9/9.  Bilateral tubal ligation.   IUD REMOVAL  2005   in cervix   TUBAL LIGATION     tubal reanastomosis  08/13/2016     OB History     Gravida  2   Para  2   Term  2   Preterm  0   AB  0   Living  2      SAB  0   IAB  0   Ectopic  0   Multiple  0   Live Births  2           Family History  Problem Relation Age of Onset   Alcohol abuse Mother    Drug abuse Mother    Depression Mother    Alcohol abuse Father    Drug abuse Father    Mental illness Sister        bipolar ptsd   Schizophrenia Sister    Diabetes Maternal Grandmother    Hypertension Maternal Grandmother    Cancer Maternal Grandmother        ovarian, breast and lung   Depression Maternal Grandmother     Social History   Tobacco Use   Smoking status: Every Day    Packs/day: 0.25    Pack years: 0.00    Types: Cigarettes   Smokeless tobacco: Never  Vaping Use   Vaping Use: Never used  Substance Use Topics   Alcohol use: Not Currently    Comment: occasional   Drug use: Yes    Frequency: 3.0 times per week    Types: Marijuana    Home Medications Prior to Admission medications   Medication Sig Start Date End Date Taking? Authorizing Provider  cabotegravir & rilpivirine ER (CABENUVA) 600 & 900 MG/3ML injection Inject 1 kit into the muscle every 30 (thirty) days. 12/12/20   Kuppelweiser, Cassie L, RPH-CPP  Cabotegravir Sodium 30 MG TABS Take 30 mg by mouth daily. 12/04/20   Golden Circle, FNP  cyclobenzaprine (FLEXERIL) 10 MG tablet Take 1 tablet (10 mg total) by mouth 3 (three) times daily as needed for muscle spasms. 05/21/20   Anderson, Chelsey L, DO  naproxen sodium (ALEVE) 220 MG tablet Take 220 mg by mouth daily as needed (For pain).    [provider]  psyllium (METAMUCIL) 58.6 % packet Take 1 packet by mouth daily.    [provider]  rilpivirine (EDURANT) 25 MG TABS tablet Take 1 tablet (25 mg total) by mouth daily with breakfast. 12/04/20   Golden Circle, FNP    Allergies    Contrast media [iodinated diagnostic agents]  Review of Systems   Review of Systems  Constitutional:  Negative for chills and fever.  HENT:  Negative for ear pain and sore throat.   Eyes:  Negative for pain and visual disturbance.  Respiratory:  Negative for cough and shortness of breath.   Cardiovascular:  Negative for chest pain and palpitations.  Gastrointestinal:  Positive for abdominal pain. Negative for vomiting.  Genitourinary:  Negative for dysuria and hematuria.  Musculoskeletal:  Negative for arthralgias and back pain.  Skin:  Negative for color change and rash.  Neurological:  Negative for seizures and syncope.  All other systems reviewed and are negative.  Physical Exam Updated Vital Signs BP 118/85   Pulse (!) 59   Temp 98.3 F (36.8 C) (Oral)   Resp 18   LMP 12/07/2020 (Exact Date)   SpO2 100%   Physical Exam Vitals and nursing note reviewed.  Constitutional:      General: She is not in acute distress.    Appearance: She is well-developed.  HENT:     Head: Normocephalic and atraumatic.  Eyes:     Conjunctiva/sclera: Conjunctivae normal.  Cardiovascular:     Rate and Rhythm: Normal rate and regular rhythm.     Heart sounds: No murmur heard. Pulmonary:     Effort: Pulmonary effort is normal. No respiratory distress.     Breath sounds: Normal breath sounds.  Abdominal:     Palpations: Abdomen is soft.     Tenderness: There is abdominal tenderness.     Comments: Tenderness isolated to right lower quadrant  Genitourinary:    Comments: Normal-appearing vagina, cervix, no significant discharge appreciated, no adnexal tenderness bilaterally, no cervical motion tenderness Musculoskeletal:     Cervical back: Neck supple.  Skin:    General: Skin is warm and dry.  Neurological:     Mental Status: She is alert.    ED Results / Procedures / Treatments   Labs (all labs ordered are listed, but only abnormal results are  displayed) Labs Reviewed  URINALYSIS, ROUTINE W REFLEX MICROSCOPIC - Abnormal; Notable for the following components:      Result Value   APPearance CLOUDY (*)    All other components within normal limits  CBC - Abnormal; Notable for the following components:   WBC  11.5 (*)    RDW 15.9 (*)    All other components within normal limits  COMPREHENSIVE METABOLIC PANEL - Abnormal; Notable for the following components:   Glucose, Bld 103 (*)    AST 14 (*)    Anion gap 4 (*)    All other components within normal limits  LIPASE, BLOOD - Abnormal; Notable for the following components:   Lipase 60 (*)    All other components within normal limits  WET PREP, GENITAL  HCG, QUANTITATIVE, PREGNANCY  I-STAT BETA HCG BLOOD, ED ([facility], [facility], [facility] ONLY)  GC/CHLAMYDIA PROBE AMP ([place]) NOT AT [name]    EKG None  Radiology CT ABDOMEN PELVIS WO CONTRAST  Result Date: 12/21/2020 CLINICAL DATA:  Right flank pain radiating to pelvis for 5 days. EXAM: CT ABDOMEN AND PELVIS WITHOUT CONTRAST TECHNIQUE: Multidetector CT imaging of the abdomen and pelvis was performed following the standard protocol without IV contrast. COMPARISON:  None. FINDINGS: Lower chest: No acute findings. Hepatobiliary: No mass visualized on this unenhanced exam. Gallbladder is unremarkable. No evidence of biliary ductal dilatation. Pancreas: No mass or inflammatory process visualized on this unenhanced exam. Spleen:  Within normal limits in size. Adrenals/Urinary tract: No evidence of urolithiasis or hydronephrosis. Unremarkable unopacified urinary bladder. Stomach/Bowel: No evidence of obstruction, inflammatory process, or abnormal fluid collections. Normal appendix visualized. Vascular/Lymphatic: No pathologically enlarged lymph nodes identified. No evidence of abdominal aortic aneurysm. Reproductive:  No mass or other significant abnormality. Other:  None. Musculoskeletal:  No suspicious bone lesions identified. IMPRESSION: No evidence of  urolithiasis, hydronephrosis, or other acute findings. Electronically Signed   By: Marlaine Hind M.D.   On: 12/21/2020 18:26    Procedures Procedures   Medications Ordered in ED Medications  oxyCODONE-acetaminophen (PERCOCET/ROXICET) 5-325 MG per tablet 1 tablet (1 tablet Oral Given 12/21/20 1559)    ED Course  I have reviewed the triage vital signs and the nursing notes.  Pertinent labs & imaging results that were available during my care of the patient were reviewed by me and considered in my medical decision making (see chart for details).  Clinical Course as of 12/22/20 1515  Fri Dec 21, 2020  1606 Saw and evaluated patient, will provide pain meds and check CT and reassess [RD]    Clinical Course User Index [RD] Lucrezia Starch, MD   MDM Rules/Calculators/A&P                            35 year old lady with HIV presented to ER with concern for right flank pain.  On exam she appears well in no distress, did note some tenderness in the right flank right lower quadrant.  Performed GU exam, RN chaperone, no adnexal tenderness, no significant discharge.  CT scan negative for any acute pathology.  Updated patient on results, requested urinalysis but patient ultimately left before treatment was complete.  While my suspicion for pyelonephritis or UTI was relatively low given the location of pain and still felt it was prudent to check a urinalysis.  Despite these recommendations, patient left.  Verbally instructed patient to check MyChart for results of her STD testing and follow-up with her primary doctor.  Patient left before she was provided paperwork.    After the discussed management above, the patient was determined to be safe for discharge.  The patient was in agreement with this plan and all questions regarding their care were answered.  ED return precautions were discussed and the  patient will return to the ED with any significant worsening of condition.  Final Clinical  Impression(s) / ED Diagnoses Final diagnoses:  Right flank pain    Rx / DC Orders ED Discharge Orders     None        Lucrezia Starch, MD 12/22/20 [phone]

## 2020-12-21 NOTE — ED Provider Notes (Signed)
Emergency Medicine Provider Triage Evaluation Note  Candice [name] , a 35 y.o. female  was evaluated in triage.  Pt complains of right-sided flank pain.  Symptoms have been going on for 4 days.  Denies any urinary symptoms, diarrhea, vomiting, fever or history of kidney stones.  Review of Systems  Positive: Right-sided flank Negative: Vomiting, diarrhea, fever, urinary symptoms  Physical Exam  BP 133/79 (BP Location: Left Arm)   Pulse 88   Temp 98.3 F (36.8 C) (Oral)   Resp 18   LMP 12/07/2020 (Exact Date)   SpO2 98%  Gen:   Awake, no distress   Resp:  Normal effort  MSK:   Moves extremities without difficulty  Other:  Right flank tenderness  Medical Decision Making  Medically screening exam initiated at 1:23 PM.  Appropriate orders placed.  Candice [name] was informed that the remainder of the evaluation will be completed by another provider, this initial triage assessment does not replace that evaluation, and the importance of remaining in the ED until their evaluation is complete.  Lab work and urinalysis ordered   Delia Heady, Hershal Coria 12/21/20 [place], [place], MD 12/21/20 1725

## 2020-12-21 NOTE — ED Notes (Signed)
Pt stated "I've waited for my discharge paperwork and I can not wait anymore. The doctor has already told me everything I need to know." Pt proceeded to leave before given d/c paperwork. Dr. Roslynn Amble made aware via epic message.

## 2020-12-21 NOTE — ED Triage Notes (Addendum)
Diagnosed with HIV last year. Just started new prescription for diagnosis on Monday.  Right flank pain started on Monday. Initially thought it was from sleeping wrong.   Pain has now worsened and radiates now to suprapubic pain.   9/10 right flank pain   Last intercourse Sunday and the condom ripped and she replaced it with a new one.   Patient reports her cycle app says she is ovulating now.   Hx: uterine cyst   No urinary symptoms  Denies N/V

## 2020-12-25 LAB — GC/CHLAMYDIA PROBE AMP (~~LOC~~) NOT AT ARMC
Chlamydia: NEGATIVE
Comment: NEGATIVE
Comment: NORMAL
Neisseria Gonorrhea: NEGATIVE

## 2021-01-11 DIAGNOSIS — Z5181 Encounter for therapeutic drug level monitoring: Secondary | ICD-10-CM | POA: Insufficient documentation

## 2021-01-11 NOTE — Assessment & Plan Note (Signed)
Candice [name] was monitored for 15 minutes after her injections and tolerated well without any immediate concern for side effect or allergic response.

## 2021-01-11 NOTE — Assessment & Plan Note (Addendum)
Delaney received her first/loading injections of Cabenuva for treatment today after completing oral lead in without any concern.  The patient will return as expected for maintenance injections Will continue with Q98mviral load monitoring during first 6 months for therapeutic treatment monitoring.   Last VL result:  HIV 1 RNA Quant (Copies/mL)  Date Value  11/22/2020 Not Detected    Dose Interval: Q234mnjections to start after return visit  Next Appointment: 02/14/2021

## 2021-01-11 NOTE — Progress Notes (Signed)
Subjective:    Patient ID: Candice [name], female    DOB: 1985/09/26, 35 y.o.   MRN: [id number]  CC:  Cabenuva injection - first loading injection    HPI:  Candice [name] is a 35 y.o. female with well controlled HIV disease here for {initial; Q1 month; Q2 month Cabenuva injection visit.  She elected to do a oral lead in which she started on 6/27. She took this combination of Cabotegrovir + Rilpivirine combination correctly with food everyday. Missed no doses. Tolerated well without side effects. Noticed some improvement in her bowels/stomach after stopping Biktarvy.   She has noticed some decreased energy lately. Not sure if this is the medication or related to MS or something else.    Allergies  Allergen Reactions   Contrast Media [Iodinated Diagnostic Agents] Nausea And Vomiting      Outpatient Medications Prior to Visit  Medication Sig Dispense Refill   cabotegravir & rilpivirine ER (CABENUVA) 600 & 900 MG/3ML injection Inject 1 kit into the muscle every 30 (thirty) days. 6 mL 1   cyclobenzaprine (FLEXERIL) 10 MG tablet Take 1 tablet (10 mg total) by mouth 3 (three) times daily as needed for muscle spasms. 30 tablet 0   naproxen sodium (ALEVE) 220 MG tablet Take 220 mg by mouth daily as needed (For pain).     psyllium (METAMUCIL) 58.6 % packet Take 1 packet by mouth daily.     Cabotegravir Sodium 30 MG TABS Take 30 mg by mouth daily. 30 tablet 0   rilpivirine (EDURANT) 25 MG TABS tablet Take 1 tablet (25 mg total) by mouth daily with breakfast. 30 tablet 0   No facility-administered medications prior to visit.     Past Medical History:  Diagnosis Date   Complication of anesthesia    epidural with last pregnancy made entire left side numb, had to d/c   Depression    Headache    HSV 06/28/2007   Hx of migraines       Past Surgical History:  Procedure Laterality Date   BREAST SURGERY  2019   reduction   CESAREAN SECTION     CESAREAN SECTION  09/07/2011    Procedure: CESAREAN SECTION;  Surgeon: Florian Buff, MD;  Location: [facility];  Service: Gynecology;  Laterality: N/A;  Repeat cesarean section with delivery of baby boy at 6. Apgars 9/9.  Bilateral tubal ligation.   IUD REMOVAL  2005   in cervix   TUBAL LIGATION     tubal reanastomosis  08/13/2016      Review of Systems  Constitutional:  Negative for appetite change, chills, fatigue, fever and unexpected weight change.  HENT:  Negative for mouth sores, sore throat and trouble swallowing.   Eyes:  Negative for pain and visual disturbance.  Respiratory:  Negative for cough and shortness of breath.   Cardiovascular:  Negative for chest pain.  Gastrointestinal:  Positive for abdominal distention and constipation. Negative for abdominal pain, diarrhea and nausea.  Genitourinary:  Negative for dysuria, menstrual problem and pelvic pain.  Musculoskeletal:  Negative for back pain and neck pain.  Skin:  Negative for color change and rash.  Neurological:  Negative for weakness, numbness and headaches.  Hematological:  Negative for adenopathy.  Psychiatric/Behavioral:  Negative for dysphoric mood. The patient is not nervous/anxious.        Objective:    BP 110/72   Pulse 72   Temp 97.8 F (36.6 C) (Oral)   Wt 189 lb 6.4 oz (  85.9 kg)   SpO2 100%   BMI 33.55 kg/m  Nursing note and vital signs reviewed.  Physical Exam Vitals reviewed.  HENT:     Mouth/Throat:     Mouth: No oral lesions.     Dentition: No dental abscesses.  Cardiovascular:     Rate and Rhythm: Normal rate and regular rhythm.     Heart sounds: Normal heart sounds.  Pulmonary:     Effort: Pulmonary effort is normal.     Breath sounds: Normal breath sounds.  Abdominal:     General: There is no distension.     Palpations: Abdomen is soft.     Tenderness: There is no abdominal tenderness.  Musculoskeletal:        General: No tenderness. Normal range of motion.  Lymphadenopathy:     Cervical: No cervical  adenopathy.  Skin:    General: Skin is warm and dry.     Findings: No rash.  Neurological:     Mental Status: She is alert and oriented to person, place, and time.  Psychiatric:        Judgment: Judgment normal.        Assessment & Plan:   Patient Active Problem List   Diagnosis Date Noted   Medication monitoring encounter 01/11/2021   MS (multiple sclerosis) ([place]) 05/21/2020   GERD (gastroesophageal reflux disease) 05/21/2020   Right-sided abdominal pain of unknown etiology 04/06/2020   Abnormal MRI, spine 04/06/2020   Metatarsalgia of right foot 04/06/2020   Right leg numbness 02/07/2020   HIV (human immunodeficiency virus infection) ([place]) 09/07/2019   Hemorrhagic cyst of left ovary 10/14/2016   Abdominal pain 04/16/2016   MRSA colonization 09/25/2014   Major depressive disorder, recurrent episode, moderate ([place]) 06/08/2013   Genital herpes 02/16/2013   Cervical cancer screening 10/10/2010   OBESITY, NOS 08/20/2006    Problem List Items Addressed This Visit       Unprioritized   Medication monitoring encounter    Candice [name] was monitored for 15 minutes after her injections and tolerated well without any immediate concern for side effect or allergic response.        HIV (human immunodeficiency virus infection) ([place]) - Primary    Candice [name] received her first/loading injections of Cabenuva for treatment today after completing oral lead in without any concern.  The patient will return as expected for maintenance injections Will continue with Q58mviral load monitoring during first 6 months for therapeutic treatment monitoring.   Last VL result:  HIV 1 RNA Quant (Copies/mL)  Date Value  11/22/2020 Not Detected   Dose Interval: Q21mnjections to start after return visit  Next Appointment: 02/14/2021           I have discontinued Candice [name]'s Cabotegravir Sodium and rilpivirine. I am also having her maintain her naproxen sodium, psyllium, cyclobenzaprine,  and cabotegravir & rilpivirine ER.   No orders of the defined types were placed in this encounter.    Follow-up: RTC in 30 days for maintenance injection. Appt has been scheduled.     StJanene MadeiraMSN, NP-C [facility] CoNew [email or web address] Pager: [phone]: [phone]

## 2021-01-14 ENCOUNTER — Encounter: Payer: Self-pay | Admitting: Infectious Diseases

## 2021-01-14 ENCOUNTER — Other Ambulatory Visit: Payer: Self-pay

## 2021-01-14 ENCOUNTER — Ambulatory Visit (INDEPENDENT_AMBULATORY_CARE_PROVIDER_SITE_OTHER): Payer: BC Managed Care – PPO | Admitting: Infectious Diseases

## 2021-01-14 VITALS — BP 110/72 | HR 72 | Temp 97.8°F | Wt 189.4 lb

## 2021-01-14 DIAGNOSIS — Z21 Asymptomatic human immunodeficiency virus [HIV] infection status: Secondary | ICD-10-CM | POA: Diagnosis not present

## 2021-01-14 DIAGNOSIS — Z5181 Encounter for therapeutic drug level monitoring: Secondary | ICD-10-CM

## 2021-01-14 MED ORDER — CABOTEGRAVIR & RILPIVIRINE ER 600 & 900 MG/3ML IM SUER
1.0000 | Freq: Once | INTRAMUSCULAR | Status: AC
  Administered 2021-01-14: 1 via INTRAMUSCULAR

## 2021-01-14 NOTE — Patient Instructions (Signed)
We gave you your injection of CABENUVA for treatment today.   Please stop your pills now.   Your next appointment has been scheduled in 1 month on 02/14/2021 with our pharmacy team, Cassie   It is very important to keep your appointments for treatment scheduled in a specific range to ensure that the medication works well for you. For this reason you will have appointments set out a few months ahead of time to keep you on track.      Helpful Tips:  If you experience any knots under the skin please use a warm compress to help.    Some people experience some redness at the site of the injection. This can be normal and should go away soon.   Moving around today is a good idea, if you sit too much it hurts more.   Please call the office to speak with our pharmacy or triage team if you have any questions or concerns.

## 2021-01-14 NOTE — Addendum Note (Signed)
Addended by: Leatrice Jewels on: 01/14/2021 09:41 AM   Modules accepted: Orders

## 2021-01-28 ENCOUNTER — Other Ambulatory Visit: Payer: Self-pay | Admitting: Student in an Organized Health Care Education/Training Program

## 2021-01-31 ENCOUNTER — Encounter: Payer: Self-pay | Admitting: Pharmacist

## 2021-01-31 MED ORDER — VALACYCLOVIR HCL 1 G PO TABS: 1000.0000 mg | ORAL_TABLET | Freq: Two times a day (BID) | ORAL | 0 refills | Status: DC

## 2021-01-31 NOTE — Telephone Encounter (Signed)
Refilled by FNP [place].

## 2021-02-11 ENCOUNTER — Ambulatory Visit: Payer: BC Managed Care – PPO | Admitting: Diagnostic Neuroimaging

## 2021-02-14 ENCOUNTER — Ambulatory Visit (INDEPENDENT_AMBULATORY_CARE_PROVIDER_SITE_OTHER): Payer: BC Managed Care – PPO | Admitting: Pharmacist

## 2021-02-14 ENCOUNTER — Other Ambulatory Visit: Payer: Self-pay

## 2021-02-14 DIAGNOSIS — Z21 Asymptomatic human immunodeficiency virus [HIV] infection status: Secondary | ICD-10-CM | POA: Diagnosis not present

## 2021-02-14 MED ORDER — CABOTEGRAVIR & RILPIVIRINE ER 600 & 900 MG/3ML IM SUER
1.0000 | Freq: Once | INTRAMUSCULAR | Status: AC
  Administered 2021-02-14: 1 via INTRAMUSCULAR

## 2021-02-14 NOTE — Progress Notes (Signed)
 HPI: Candice [name] is a 35 y.o. female who presents to the [facility] pharmacy clinic for Cabenuva administration.  Patient Active Problem List   Diagnosis Date Noted   Medication monitoring encounter 01/11/2021   MS (multiple sclerosis) (HCC) 05/21/2020   GERD (gastroesophageal reflux disease) 05/21/2020   Right-sided abdominal pain of unknown etiology 04/06/2020   Abnormal MRI, spine 04/06/2020   Metatarsalgia of right foot 04/06/2020   Right leg numbness 02/07/2020   HIV (human immunodeficiency virus infection) (HCC) 09/07/2019   Hemorrhagic cyst of left ovary 10/14/2016   Abdominal pain 04/16/2016   MRSA colonization 09/25/2014   Major depressive disorder, recurrent episode, moderate (HCC) 06/08/2013   Genital herpes 02/16/2013   Cervical cancer screening 10/10/2010   OBESITY, NOS 08/20/2006    Patient's Medications  New Prescriptions   No medications on file  Previous Medications   CABOTEGRAVIR & RILPIVIRINE ER (CABENUVA) 600 & 900 MG/3ML INJECTION    Inject 1 kit into the muscle every 30 (thirty) days.   CYCLOBENZAPRINE (FLEXERIL) 10 MG TABLET    Take 1 tablet (10 mg total) by mouth 3 (three) times daily as needed for muscle spasms.   NAPROXEN SODIUM (ALEVE) 220 MG TABLET    Take 220 mg by mouth daily as needed (For pain).   PSYLLIUM (METAMUCIL) 58.6 % PACKET    Take 1 packet by mouth daily.   VALACYCLOVIR (VALTREX) 1000 MG TABLET    Take 1 tablet (1,000 mg total) by mouth 2 (two) times daily.  Modified Medications   No medications on file  Discontinued Medications   No medications on file    Allergies: Allergies  Allergen Reactions   Contrast Media [Iodinated Diagnostic Agents] Nausea And Vomiting    Past Medical History: Past Medical History:  Diagnosis Date   Complication of anesthesia    epidural with last pregnancy made entire left side numb, had to d/c   Depression    Headache    HSV 06/28/2007   Hx of migraines     Social History: Social History    Socioeconomic History   Marital status: Single    Spouse name: Not on file   Number of children: 2   Years of education: 14   Highest education level: Not on file  Occupational History    Comment: grocery manager  Tobacco Use   Smoking status: Every Day    Packs/day: 0.25    Types: Cigarettes   Smokeless tobacco: Never  Vaping Use   Vaping Use: Never used  Substance and Sexual Activity   Alcohol use: Not Currently    Comment: occasional   Drug use: Yes    Frequency: 3.0 times per week    Types: Marijuana   Sexual activity: Yes    Partners: Male    Birth control/protection: None    Comment: Decline condoms 03/29/2020  Other Topics Concern   Not on file  Social History Narrative   Works as grocery Manager.   FOB of both children lives in [place]; will be some what involved but mother is no longer dating him.   Social Determinants of Health   Financial Resource Strain: Not on file  Food Insecurity: Not on file  Transportation Needs: Not on file  Physical Activity: Not on file  Stress: Not on file  Social Connections: Not on file    Labs: Lab Results  Component Value Date   HIV1RNAQUANT Not Detected 11/22/2020   HIV1RNAQUANT <20 03/29/2020   HIV1RNAQUANT <20 02/10/2020     CD4TABS 1,026 11/22/2020   CD4TABS 1,078 03/29/2020   CD4TABS 1,233 02/10/2020    RPR and STI Lab Results  Component Value Date   LABRPR Non Reactive 10/25/2020   LABRPR NON-REACTIVE 09/08/2019   LABRPR Non Reactive 09/05/2019   LABRPR Non Reactive 10/16/2017   LABRPR NON REAC 07/13/2015    STI Results GC CT  12/21/2020 Negative Negative  11/22/2020 Negative Negative  10/25/2020 Negative Negative  09/05/2019 Negative Negative  10/16/2017 Negative Negative  06/08/2017 Negative Negative  10/14/2016 Negative Negative  02/15/2016 Negative Negative  07/13/2015 Negative Negative  04/13/2015 Negative Negative  09/24/2013 NG: Negative CT: Negative  02/11/2011 - NEGATIVE  01/29/2010 - NEGATIVE   05/02/2009 - NEGATIVE  09/29/2008 - NEGATIVE  10/12/2007 - NEGATIVE  07/01/2007 - NEGATIVE  12/30/2006 - NEGATIVE    Hepatitis B Lab Results  Component Value Date   HEPBSAB REACTIVE (A) 09/08/2019   HEPBSAG NON-REACTIVE 09/08/2019   HEPBCAB NON-REACTIVE 09/08/2019   Hepatitis C Lab Results  Component Value Date   HEPCAB NON-REACTIVE 09/08/2019   Hepatitis A Lab Results  Component Value Date   HAV BORDERLINE (A) 09/08/2019   Lipids: Lab Results  Component Value Date   CHOL 183 11/22/2020   TRIG 80 11/22/2020   HDL 51 11/22/2020   CHOLHDL 3.6 11/22/2020   VLDL 10 02/14/2013   LDLCALC 115 (H) 11/22/2020    TARGET DATE:  The 25th of the month  Current HIV Regimen: Cabenuva  Assessment: Candice [name] presents today for their maintenance Cabenuva injections. Initial/past injections were tolerated well without issues. She stated she did experience a painful knot on her left side that lasted about a week and also experienced fatigue, headache, and nausea for one week after the first injection. She felt that she tolerated the injections better than the oral lead-in though. After her shot today, she stated she felt dizzy and was provided some water. She felt better after 5 minutes though and still wants to continue the injections for now.  Administered cabotegravir 600mg/3mL in left upper outer quadrant of the gluteal muscle. Administered rilpivirine 900 mg/3mL in the right upper outer quadrant of the gluteal muscle. Monitored patient for 10 minutes after injection. Injections were tolerated well without issue. Patient will follow up in 2 months for next injection.  Plan: - Cabenuva injections administered - Next injections scheduled for 10/25 with Stephanie, 12/29 with me - Call with any issues or questions  Amanda Wolfe, PharmD, CPP Clinical Pharmacist Practitioner Infectious Diseases Clinical Pharmacist [facility]   

## 2021-02-17 LAB — HIV-1 RNA QUANT-NO REFLEX-BLD
HIV 1 RNA Quant: <20 Copies/mL — ABNORMAL HIGH
HIV-1 RNA Quant, Log: <1.3 Log cps/mL — ABNORMAL HIGH

## 2021-03-18 ENCOUNTER — Encounter: Payer: BC Managed Care – PPO | Admitting: Infectious Diseases

## 2021-04-03 IMAGING — CT CT ABD-PELV W/ CM
2 of 4 series · 17 of 46 positions shown, 19 images · IV contrast (Omni 300)
Comparison: 06/08/2017

CLINICAL DATA: Generalized abdominal pain

EXAM:
CT ABDOMEN AND PELVIS WITH CONTRAST
TECHNIQUE: Multidetector CT imaging of the abdomen and pelvis was performed
using the standard protocol following bolus administration of
intravenous contrast.
CONTRAST:  100mL OMNIPAQUE IOHEXOL 300 MG/ML  SOLN

[Series 3: a/p w/ 5mm · axial · 0.71mm/px · z∈[+864,+1239]mm · 14 of 83 slices shown, 16 images]
[im 4/83  soft-tissue]
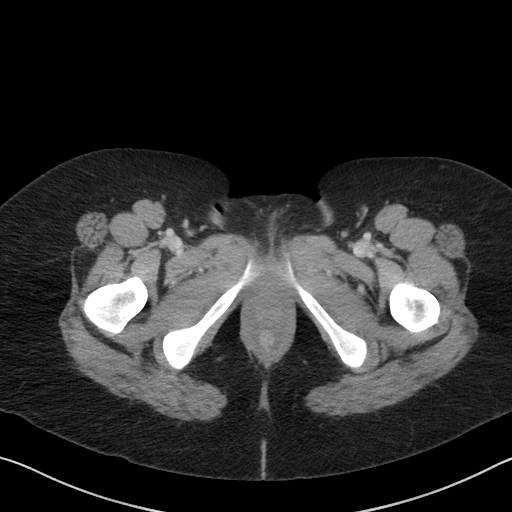
[im 4/83  bone]
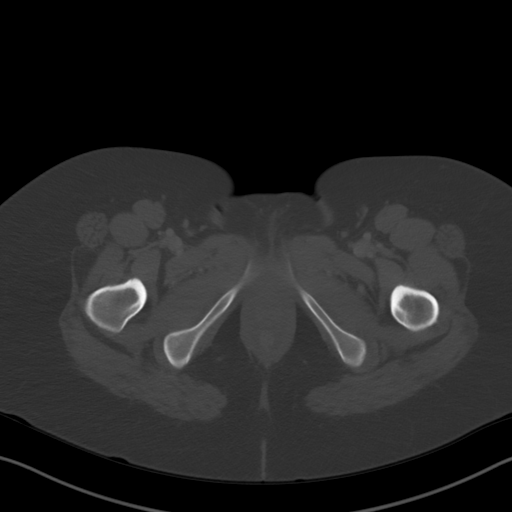
[im 10/83  soft-tissue]
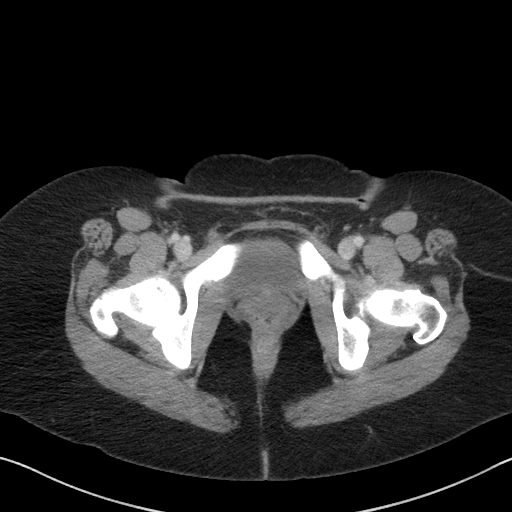
[im 17/83  soft-tissue]
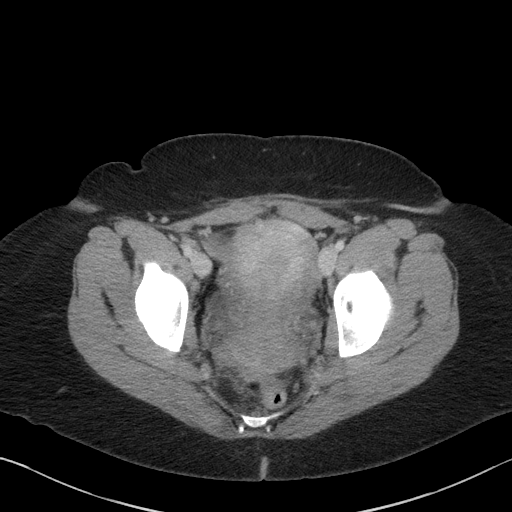
[im 23/83  soft-tissue]
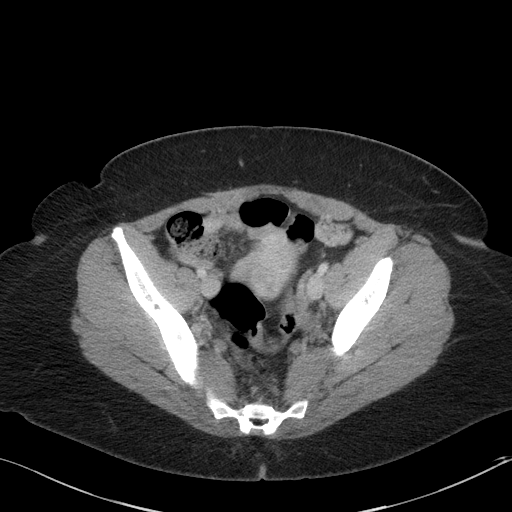
[im 27/83  soft-tissue]
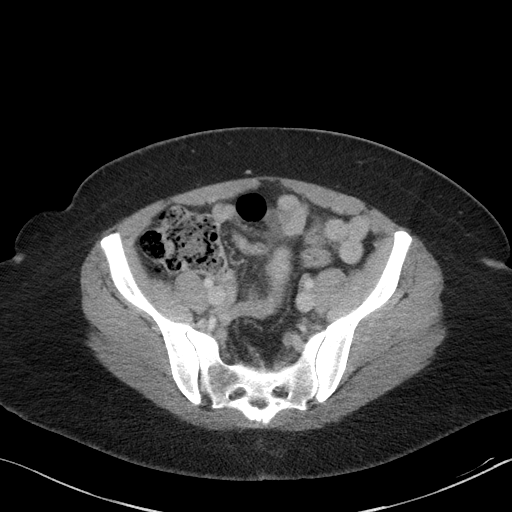
[im 33/83  soft-tissue]
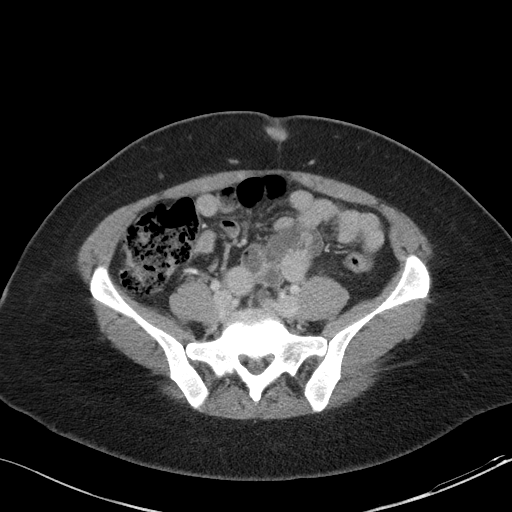
[im 40/83  soft-tissue]
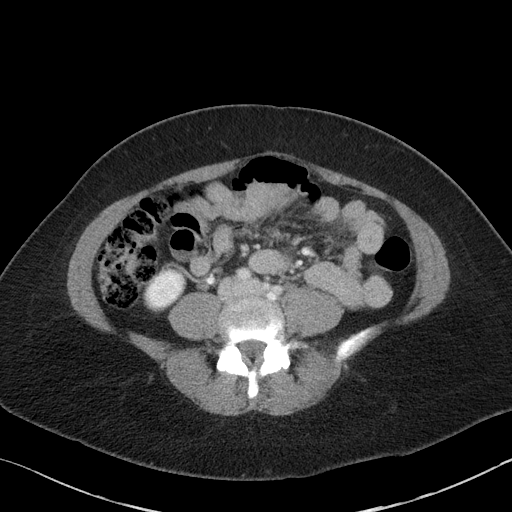
[im 43/83  soft-tissue]
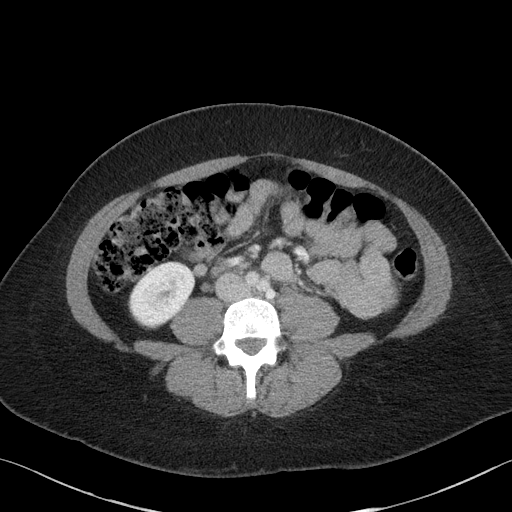
[im 50/83  soft-tissue]
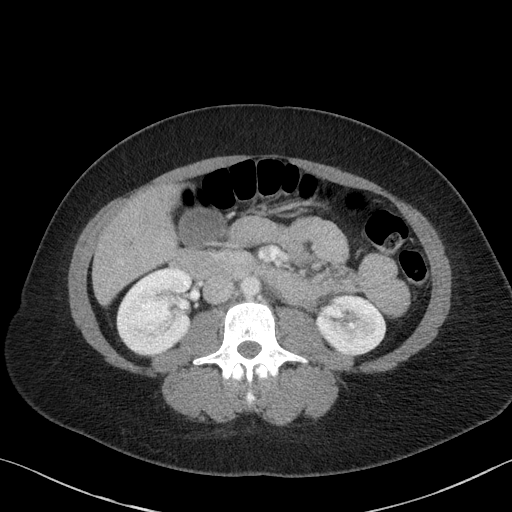
[im 50/83  bone]
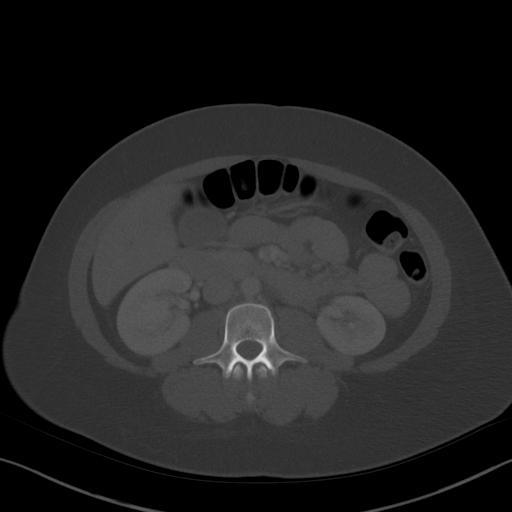
[im 56/83  soft-tissue]
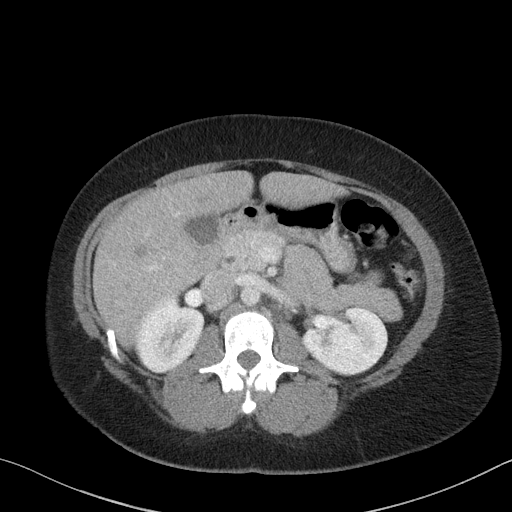
[im 63/83  soft-tissue]
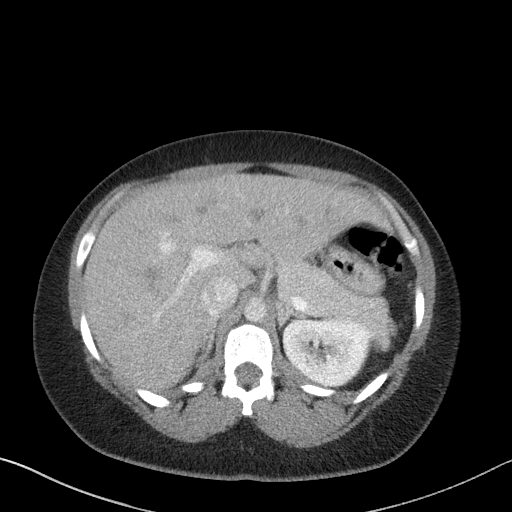
[im 66/83  soft-tissue]
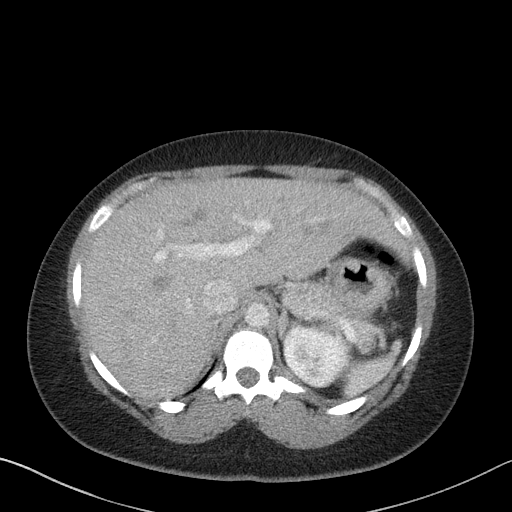
[im 73/83  soft-tissue]
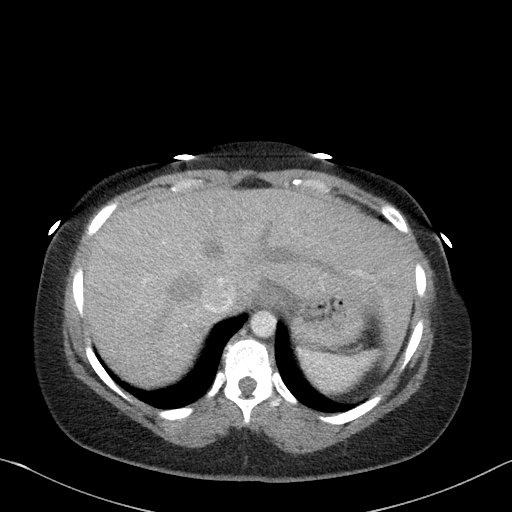
[im 79/83  soft-tissue]
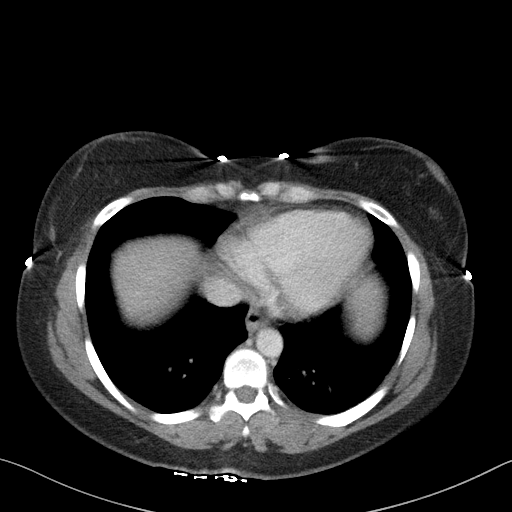

[Series 5: a/p w/ cor · coronal · 0.79mm/px · 3 of 132 slices shown]
[im 44/132  soft-tissue]
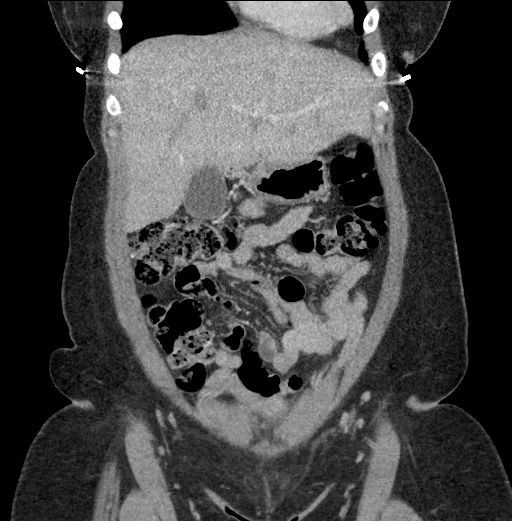
[im 59/132  soft-tissue]
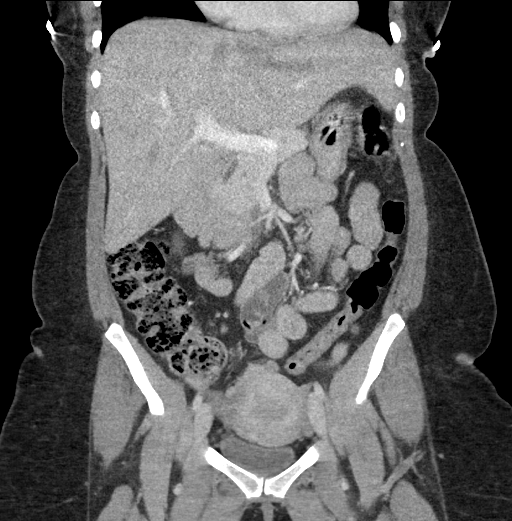
[im 73/132  soft-tissue]
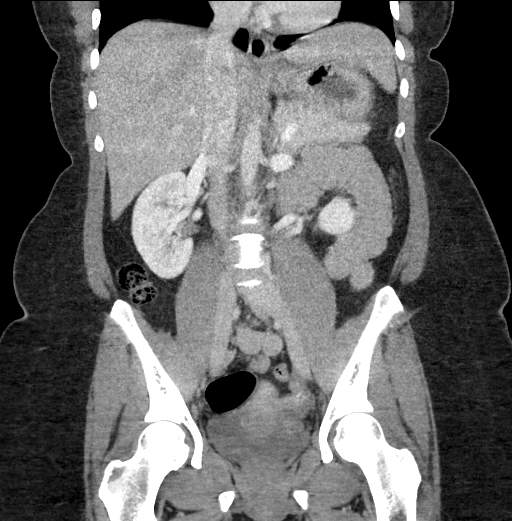

[17 of 46 positions shown; findings below may reference images not displayed]

FINDINGS: Lower chest: Lung bases are clear. No effusions. Heart is normal
size.

Hepatobiliary: No focal hepatic abnormality. Gallbladder
unremarkable.

Pancreas: No focal abnormality or ductal dilatation.

Spleen: No focal abnormality.  Normal size.

Adrenals/Urinary Tract: No adrenal abnormality. No focal renal
abnormality. No stones or hydronephrosis. Urinary bladder is
unremarkable.

Stomach/Bowel: Stomach, large and small bowel grossly unremarkable.
Normal appendix

Vascular/Lymphatic: No evidence of aneurysm or adenopathy.

Reproductive: Uterus and adnexa unremarkable.  No mass.

Other: 2 No free fluid or free air.

Musculoskeletal: No acute bony abnormality.
IMPRESSION: Normal study.

## 2021-04-16 ENCOUNTER — Other Ambulatory Visit: Payer: Self-pay

## 2021-04-16 ENCOUNTER — Encounter: Payer: Self-pay | Admitting: Infectious Diseases

## 2021-04-16 ENCOUNTER — Ambulatory Visit (INDEPENDENT_AMBULATORY_CARE_PROVIDER_SITE_OTHER): Payer: BC Managed Care – PPO | Admitting: Infectious Diseases

## 2021-04-16 VITALS — BP 111/76 | HR 83 | Temp 98.0°F | Wt 186.0 lb

## 2021-04-16 DIAGNOSIS — Z21 Asymptomatic human immunodeficiency virus [HIV] infection status: Secondary | ICD-10-CM

## 2021-04-16 DIAGNOSIS — G35 Multiple sclerosis: Secondary | ICD-10-CM | POA: Diagnosis not present

## 2021-04-16 DIAGNOSIS — Z5181 Encounter for therapeutic drug level monitoring: Secondary | ICD-10-CM | POA: Diagnosis not present

## 2021-04-16 MED ORDER — CYCLOBENZAPRINE HCL 10 MG PO TABS: 10.0000 mg | ORAL_TABLET | Freq: Three times a day (TID) | ORAL | 2 refills | Status: DC | PRN

## 2021-04-16 MED ORDER — CABOTEGRAVIR & RILPIVIRINE ER 600 & 900 MG/3ML IM SUER
1.0000 | Freq: Once | INTRAMUSCULAR | Status: AC
  Administered 2021-04-16: 1 via INTRAMUSCULAR

## 2021-04-16 NOTE — Assessment & Plan Note (Signed)
Refill flexeril for her until PCP appt. She is using sparingly and does not drive with medication.

## 2021-04-16 NOTE — Progress Notes (Signed)
Subjective:    Patient ID: Candice [name], female    DOB: 09-22-1985, 35 y.o.   MRN: [id number]  CC:  Cabenuva injection - maintenance injection    HPI:  Candice [name] is a 35 y.o. female with well controlled HIV disease here for Cabenuva injection. Last dose was given 02/14/2021. She has taken to the medication very well with no side effects that concern her. Requesting a refill of flexaril to help with MS symptom flares. Uses this very sparingly but nice to have for bad days. Current dose works well.  Declines flu shot today.     Allergies  Allergen Reactions   Contrast Media [Iodinated Diagnostic Agents] Nausea And Vomiting      Outpatient Medications Prior to Visit  Medication Sig Dispense Refill   cabotegravir & rilpivirine ER (CABENUVA) 600 & 900 MG/3ML injection Inject 1 kit into the muscle every 30 (thirty) days. 6 mL 1   naproxen sodium (ALEVE) 220 MG tablet Take 220 mg by mouth daily as needed (For pain).     psyllium (METAMUCIL) 58.6 % packet Take 1 packet by mouth daily.     valACYclovir (VALTREX) 1000 MG tablet Take 1 tablet (1,000 mg total) by mouth 2 (two) times daily. 10 tablet 0   cyclobenzaprine (FLEXERIL) 10 MG tablet Take 1 tablet (10 mg total) by mouth 3 (three) times daily as needed for muscle spasms. 30 tablet 0   No facility-administered medications prior to visit.     Past Medical History:  Diagnosis Date   Complication of anesthesia    epidural with last pregnancy made entire left side numb, had to d/c   Depression    Headache    HSV 06/28/2007   Hx of migraines       Past Surgical History:  Procedure Laterality Date   BREAST SURGERY  2019   reduction   CESAREAN SECTION     CESAREAN SECTION  09/07/2011   Procedure: CESAREAN SECTION;  Surgeon: Florian Buff, MD;  Location: [facility];  Service: Gynecology;  Laterality: N/A;  Repeat cesarean section with delivery of baby boy at 53. Apgars 9/9.  Bilateral tubal ligation.   IUD REMOVAL   2005   in cervix   TUBAL LIGATION     tubal reanastomosis  08/13/2016      Review of Systems  Constitutional:  Negative for appetite change, chills, fatigue, fever and unexpected weight change.  HENT:  Negative for mouth sores, sore throat and trouble swallowing.   Eyes:  Negative for pain and visual disturbance.  Respiratory:  Negative for cough and shortness of breath.   Cardiovascular:  Negative for chest pain.  Gastrointestinal:  Negative for abdominal pain, diarrhea and nausea.  Genitourinary:  Negative for dysuria, menstrual problem and pelvic pain.  Musculoskeletal:  Negative for back pain and neck pain.  Skin:  Negative for color change and rash.  Neurological:  Negative for weakness, numbness and headaches.  Hematological:  Negative for adenopathy.  Psychiatric/Behavioral:  Negative for dysphoric mood. The patient is not nervous/anxious.        Objective:    BP 111/76   Pulse 83   Temp 98 F (36.7 C) (Temporal)   Wt 186 lb (84.4 kg)   BMI 32.95 kg/m  Nursing note and vital signs reviewed.  Physical Exam Constitutional:      Appearance: Normal appearance. She is not ill-appearing.  HENT:     Mouth/Throat:     Mouth: Mucous membranes are moist.  Pharynx: Oropharynx is clear.  Eyes:     General: No scleral icterus. Cardiovascular:     Rate and Rhythm: Normal rate and regular rhythm.  Pulmonary:     Effort: Pulmonary effort is normal.  Neurological:     Mental Status: She is oriented to person, place, and time.  Psychiatric:        Mood and Affect: Mood normal.        Thought Content: Thought content normal.        Assessment & Plan:   Patient Active Problem List   Diagnosis Date Noted   Medication monitoring encounter 01/11/2021   MS (multiple sclerosis) ([place]) 05/21/2020   GERD (gastroesophageal reflux disease) 05/21/2020   Right-sided abdominal pain of unknown etiology 04/06/2020   Abnormal MRI, spine 04/06/2020   Metatarsalgia of right foot  04/06/2020   Right leg numbness 02/07/2020   HIV (human immunodeficiency virus infection) ([place]) 09/07/2019   Hemorrhagic cyst of left ovary 10/14/2016   Abdominal pain 04/16/2016   MRSA colonization 09/25/2014   Major depressive disorder, recurrent episode, moderate ([place]) 06/08/2013   Genital herpes 02/16/2013   Cervical cancer screening 10/10/2010   OBESITY, NOS 08/20/2006    Problem List Items Addressed This Visit       Unprioritized   MS (multiple sclerosis) ([place]) - Primary    Refill flexeril for her until PCP appt. She is using sparingly and does not drive with medication.       Medication monitoring encounter   HIV (human immunodeficiency virus infection) ([place])    Doing well on every 2 month injections of Cabenuva. Tolerating well with no side effects. All injections have been administered at appropriate treatment interval.   Will continue with Q53mviral load monitoring during first 6 - 8 months for therapeutic treatment monitoring.   Last VL result:  HIV 1 RNA Quant (Copies/mL)  Date Value  02/14/2021 <20 (H)   Dose Interval: q252mext Appointment: 06/20/2021        Relevant Orders   HIV-1 RNA quant-no reflex-bld      I am having Correne L. Thurnell Garbeaintain her naproxen sodium, psyllium, cabotegravir & rilpivirine ER, valACYclovir, and cyclobenzaprine.   Meds ordered this encounter  Medications   cyclobenzaprine (FLEXERIL) 10 MG tablet    Sig: Take 1 tablet (10 mg total) by mouth 3 (three) times daily as needed for muscle spasms.    Dispense:  30 tablet    Refill:  2    Order Specific Question:   Supervising Provider    Answer:   COThayer [id number]]     Follow-up: RTC in 60 days for maintenance injection. Appt has been scheduled.     StJanene MadeiraMSN, NP-C [facility] [email or web address] Pager: [phone]: [phone]

## 2021-04-16 NOTE — Patient Instructions (Signed)
We gave you your injection of CABENUVA for treatment today.   Your next appointment has been scheduled in 2 months on 06/20/2021  It is very important to keep your appointments for treatment scheduled in a specific range to ensure that the medication works well for you. For this reason you will have appointments set out a few months ahead of time to keep you on track.      Helpful Tips:  If you experience any knots under the skin please use a warm compress to help.    Some people experience some redness at the site of the injection. This can be normal and should go away soon.   Moving around today is a good idea, if you sit too much it hurts more.   Please call the office to speak with our pharmacy or triage team if you have any questions or concerns.     Refills have been sent for flexeril Please stop by the lab on your way out Next pap smear recommended by May 2025

## 2021-04-16 NOTE — Assessment & Plan Note (Signed)
Doing well on every 2 month injections of Cabenuva. Tolerating well with no side effects. All injections have been administered at appropriate treatment interval.   Will continue with Q59m viral load monitoring during first 6 - 8 months for therapeutic treatment monitoring.   Last VL result:  HIV 1 RNA Quant (Copies/mL)  Date Value  02/14/2021 <20 (H)    Dose Interval: q74m Next Appointment: 06/20/2021

## 2021-04-19 LAB — HIV-1 RNA QUANT-NO REFLEX-BLD
HIV 1 RNA Quant: NOT DETECTED Copies/mL
HIV-1 RNA Quant, Log: NOT DETECTED Log cps/mL

## 2021-05-06 NOTE — Telephone Encounter (Signed)
Attempted to coordinate appointment for patient and partner to see provider to discuss patient's status and answer questions/ concerns partner might have. Not able to schedule appointment appropriate for both schedules.  Braylei will call office back after she speaks with partner to coordinate a date/time that works for them. Patient was okay with CMA discussing status with partner.  Leatrice Jewels, RMA

## 2021-05-08 ENCOUNTER — Ambulatory Visit: Payer: BC Managed Care – PPO | Admitting: Internal Medicine

## 2021-05-13 ENCOUNTER — Ambulatory Visit (INDEPENDENT_AMBULATORY_CARE_PROVIDER_SITE_OTHER): Payer: BC Managed Care – PPO | Admitting: Internal Medicine

## 2021-05-13 ENCOUNTER — Other Ambulatory Visit: Payer: Self-pay

## 2021-05-13 DIAGNOSIS — Z21 Asymptomatic human immunodeficiency virus [HIV] infection status: Secondary | ICD-10-CM

## 2021-05-13 NOTE — Assessment & Plan Note (Signed)
Discussed in detail with patient and her partner regarding HIV transmission and concept of U=U and treatment as prevention.  Since she is undetectable his risk of HIV acquisition is minimal and could further be decreased by condom use as well as PrEP.  We discussed the idea of PrEP to help prevent transmission and that he could consider this.  We talked about oral PrEP with Descovy vs long acting Apretude and I gave him information on Apretude for him to consider.  He was not ready to initiate PrEP today but our pharmacist will engage with him next week to see what further thoughts he has on starting.  She will continue regular follow up with Cabenuva injections.

## 2021-05-13 NOTE — Progress Notes (Signed)
[facility]   CHIEF COMPLAINT    HIV follow up.   SUBJECTIVE:    Candice [name] is a 35 y.o. female with PMHx as below who presents to the clinic for HIV follow up and with her partner to discuss PrEP.   Candice [name] is a 35 yo woman accompanied by her partner to discuss the possibility of PrEP therapy for him.  She has a history of well controlled HIV disease followed by Candice [name] and is currently on [place].  She was last seen on 04/16/21 and received her every 2 month injection on that date.  Her viral load that date was undetectable as well.  She recently disclosed her status to her partner with whom she has had protected and unprotected intercourse.  They are here together to discuss options going forward such as PrEP.  Please see A&P for the details of today's visit and status of the patient's medical problems.   Patient's Medications  New Prescriptions   No medications on file  Previous Medications   CABOTEGRAVIR & RILPIVIRINE ER (CABENUVA) 600 & 900 MG/3ML INJECTION    Inject 1 kit into the muscle every 30 (thirty) days.   CYCLOBENZAPRINE (FLEXERIL) 10 MG TABLET    Take 1 tablet (10 mg total) by mouth 3 (three) times daily as needed for muscle spasms.   NAPROXEN SODIUM (ALEVE) 220 MG TABLET    Take 220 mg by mouth daily as needed (For pain).   PSYLLIUM (METAMUCIL) 58.6 % PACKET    Take 1 packet by mouth daily.   VALACYCLOVIR (VALTREX) 1000 MG TABLET    Take 1 tablet (1,000 mg total) by mouth 2 (two) times daily.  Modified Medications   No medications on file  Discontinued Medications   No medications on file      Past Medical History:  Diagnosis Date   Complication of anesthesia    epidural with last pregnancy made entire left side numb, had to d/c   Depression    Headache    HSV 06/28/2007   Hx of migraines     Social History   Tobacco Use   Smoking status: Every Day    Packs/day: 0.25    Types: Cigarettes   Smokeless  tobacco: Never  Vaping Use   Vaping Use: Never used  Substance Use Topics   Alcohol use: Not Currently    Comment: occasional   Drug use: Yes    Frequency: 3.0 times per week    Types: Marijuana    Family History  Problem Relation Age of Onset   Alcohol abuse Mother    Drug abuse Mother    Depression Mother    Alcohol abuse Father    Drug abuse Father    Mental illness Sister        bipolar ptsd   Schizophrenia Sister    Diabetes Maternal Grandmother    Hypertension Maternal Grandmother    Cancer Maternal Grandmother        ovarian, breast and lung   Depression Maternal Grandmother     Allergies  Allergen Reactions   Contrast Media [Iodinated Diagnostic Agents] Nausea And Vomiting    Review of Systems  Constitutional: Negative.   All other systems reviewed and are negative.   OBJECTIVE:    Vitals:   05/13/21 1033  Resp: 17  Weight: 183 lb (83 kg)  Height: $Remove'5\' 3"'CGnPawe$  (1.6 m)     Body mass index is 32.42  kg/m.  Physical Exam Constitutional:      General: She is not in acute distress.    Appearance: Normal appearance.  Pulmonary:     Effort: Pulmonary effort is normal. No respiratory distress.  Musculoskeletal:        General: Normal range of motion.  Skin:    General: Skin is warm and dry.     Findings: No rash.  Neurological:     General: No focal deficit present.     Mental Status: She is alert and oriented to person, place, and time.    Labs and Microbiology: CMP Latest Ref Rng & Units 12/21/2020 11/22/2020 04/11/2020  Glucose 70 - 99 mg/dL 103(H) 94 95  BUN 6 - 20 mg/dL $Remove'9 14 10  'PLCbPJV$ Creatinine 0.44 - 1.00 mg/dL 0.59 0.98 0.76  Sodium 135 - 145 mmol/L 137 139 139  Potassium 3.5 - 5.1 mmol/L 3.5 3.8 3.8  Chloride 98 - 111 mmol/L 107 106 107  CO2 22 - 32 mmol/L $RemoveB'26 24 22  'WjVVlzxm$ Calcium 8.9 - 10.3 mg/dL 9.0 9.2 9.3  Total Protein 6.5 - 8.1 g/dL 7.2 6.8 6.9  Total Bilirubin 0.3 - 1.2 mg/dL 0.8 0.2 0.4  Alkaline Phos 38 - 126 U/L 42 - 44  AST 15 - 41 U/L 14(L) 11  13(L)  ALT 0 - 44 U/L $Remo'12 10 11   'Zrlok$ CBC Latest Ref Rng & Units 12/21/2020 11/22/2020 04/11/2020  WBC 4.0 - 10.5 K/uL 11.5(H) 15.2(H) 13.3(H)  Hemoglobin 12.0 - 15.0 g/dL 12.5 12.8 12.2  Hematocrit 36.0 - 46.0 % 39.4 39.9 38.2  Platelets 150 - 400 K/uL 300 291 310     Lab Results  Component Value Date   HIV1RNAQUANT Not Detected 04/16/2021   HIV1RNAQUANT <20 (H) 02/14/2021   HIV1RNAQUANT Not Detected 11/22/2020   CD4TABS 1,026 11/22/2020   CD4TABS 1,078 03/29/2020   CD4TABS 1,233 02/10/2020    RPR and STI: Lab Results  Component Value Date   LABRPR Non Reactive 10/25/2020   LABRPR NON-REACTIVE 09/08/2019   LABRPR Non Reactive 09/05/2019   LABRPR Non Reactive 10/16/2017   LABRPR NON REAC 07/13/2015    STI Results GC CT  12/21/2020 Negative Negative  11/22/2020 Negative Negative  10/25/2020 Negative Negative  09/05/2019 Negative Negative  10/16/2017 Negative Negative  06/08/2017 Negative Negative  10/14/2016 Negative Negative  02/15/2016 Negative Negative  07/13/2015 Negative Negative  04/13/2015 Negative Negative  09/24/2013 NG: Negative CT: Negative  02/11/2011 - NEGATIVE  01/29/2010 - NEGATIVE  05/02/2009 - NEGATIVE  09/29/2008 - NEGATIVE  10/12/2007 - NEGATIVE  07/01/2007 - NEGATIVE  12/30/2006 - NEGATIVE    Hepatitis B: Lab Results  Component Value Date   HEPBSAB REACTIVE (A) 09/08/2019   HEPBSAG NON-REACTIVE 09/08/2019   HEPBCAB NON-REACTIVE 09/08/2019   Hepatitis C: Lab Results  Component Value Date   HEPCAB NON-REACTIVE 09/08/2019   Hepatitis A: Lab Results  Component Value Date   HAV BORDERLINE (A) 09/08/2019   Lipids: Lab Results  Component Value Date   CHOL 183 11/22/2020   TRIG 80 11/22/2020   HDL 51 11/22/2020   CHOLHDL 3.6 11/22/2020   VLDL 10 02/14/2013   LDLCALC 115 (H) 11/22/2020     ASSESSMENT & PLAN:    HIV (human immunodeficiency virus infection) ([place]) Discussed in detail with patient and her partner regarding HIV transmission and concept of U=U  and treatment as prevention.  Since she is undetectable his risk of HIV acquisition is minimal and could further be decreased by condom use as well  as PrEP.  We discussed the idea of PrEP to help prevent transmission and that he could consider this.  We talked about oral PrEP with Descovy vs long acting Apretude and I gave him information on Apretude for him to consider.  He was not ready to initiate PrEP today but our pharmacist will engage with him next week to see what further thoughts he has on starting.  She will continue regular follow up with Cabenuva injections.    Raynelle [facility] for Infectious Disease [facility] 05/13/2021, 11:39 AM

## 2021-05-20 ENCOUNTER — Other Ambulatory Visit: Payer: Self-pay

## 2021-05-20 MED ORDER — VALACYCLOVIR HCL 1 G PO TABS: 1000.0000 mg | ORAL_TABLET | Freq: Two times a day (BID) | ORAL | 0 refills | Status: DC

## 2021-06-10 ENCOUNTER — Ambulatory Visit (HOSPITAL_COMMUNITY)
Admission: RE | Admit: 2021-06-10 | Discharge: 2021-06-10 | Disposition: A | Discharge status: Home / Self Care | Payer: BC Managed Care – PPO | Source: Ambulatory Visit | Attending: Family Medicine | Admitting: Family Medicine

## 2021-06-10 ENCOUNTER — Ambulatory Visit (INDEPENDENT_AMBULATORY_CARE_PROVIDER_SITE_OTHER): Payer: BC Managed Care – PPO | Admitting: Family Medicine

## 2021-06-10 ENCOUNTER — Telehealth: Payer: Self-pay

## 2021-06-10 ENCOUNTER — Other Ambulatory Visit: Payer: Self-pay

## 2021-06-10 VITALS — BP 128/80 | HR 72 | Ht 63.0 in | Wt 185.0 lb

## 2021-06-10 DIAGNOSIS — F39 Unspecified mood [affective] disorder: Secondary | ICD-10-CM | POA: Diagnosis not present

## 2021-06-10 DIAGNOSIS — F32A Depression, unspecified: Secondary | ICD-10-CM | POA: Insufficient documentation

## 2021-06-10 DIAGNOSIS — F419 Anxiety disorder, unspecified: Secondary | ICD-10-CM

## 2021-06-10 DIAGNOSIS — R4589 Other symptoms and signs involving emotional state: Secondary | ICD-10-CM | POA: Diagnosis not present

## 2021-06-10 DIAGNOSIS — F331 Major depressive disorder, recurrent, moderate: Secondary | ICD-10-CM

## 2021-06-10 MED ORDER — HYDROXYZINE HCL 25 MG PO TABS: 25.0000 mg | ORAL_TABLET | Freq: Three times a day (TID) | ORAL | 0 refills | Status: DC | PRN

## 2021-06-10 MED ORDER — SERTRALINE HCL 50 MG PO TABS: 50.0000 mg | ORAL_TABLET | Freq: Every day | ORAL | 2 refills | Status: DC

## 2021-06-10 NOTE — Telephone Encounter (Signed)
Patient calls nurse line tearful requesting to get back on Zoloft. Patient reports she has been off of this medication for ~1 year. Patient reports she is no harm to herself or others. Patient reports she is feeling really overwhelmed and had to leave work before she "cussed someone out." Patient reports increased irritability and anxious thoughts.   Resources given to patient for crisis line and walk ins. Apt scheduled this afternoon at [facility].

## 2021-06-10 NOTE — Patient Instructions (Signed)
It was wonderful to meet you today. Thank you for allowing me to be a part of your care. Below is a short summary of what we discussed at your visit today:  Depressed and anxious mood - Start zoloft 50 mg daily - Start hydroxyzine 25-50 mg up to three times daily as needed for anxiety (will make you somewhat sedated) - We got an EKG today to know your baseline heart rhythm measurements before starting these meds - Come back in one month for a check in  Therapy Resources Go to https://www.psychologytoday.com/us. Search for [place], [place] (or city of your choice).  On the next page, you will see filter options at the top.  You may filter therapists by insurance they take, issues you would like to work on, or therapy types.  You can even search by therapist age, gender, ethnicity, and religion.     Please bring all of your medications to every appointment!  If you have any questions or concerns, please do not hesitate to contact us via phone or MyChart message.   Ezequiel Essex, MD

## 2021-06-10 NOTE — Assessment & Plan Note (Signed)
New episode.  Depressed and anxious mood, patient feels anxiety dominant recently. No SI/HI plan or intent.  Significant PMH of suicide attempt in 2015 with subsequent hospitalization. - Start Zoloft 50 mg daily - Start hydroxyzine 25 to 50 mg TID PRN for anxiety - Directed to [email or web address] to find counselor for therapy - Given that Zoloft, hydroxyzine, and current HIV meds can all prolong QTC, will obtain EKG today (QTc 412 ms) - Follow-up in 1 month with repeat EKG at that time

## 2021-06-10 NOTE — Progress Notes (Signed)
° ° °  SUBJECTIVE:   CHIEF COMPLAINT / HPI:   Depressed and anxious mood - About 6 months of worsening mood - Stressors at both home and work - PHQ-9 and gad 7 positive, details below - No current SI/HI, no intent or plan - Was previously on Zoloft x2:  - First in 2015 after suicide attempt and hospitalization, used successfully for 2 years, stopped because she felt better and stressors were improved - Second year ago after HIV diagnosis, stopped after 2 to 3 months - Tolerated Zoloft quite well, felt as though it helped, no negative side effects  PERTINENT  PMH / PSH:  Patient Active Problem List   Diagnosis Date Noted   Medication monitoring encounter 01/11/2021   MS (multiple sclerosis) ([place]) 05/21/2020   GERD (gastroesophageal reflux disease) 05/21/2020   Right-sided abdominal pain of unknown etiology 04/06/2020   Abnormal MRI, spine 04/06/2020   Metatarsalgia of right foot 04/06/2020   Right leg numbness 02/07/2020   HIV (human immunodeficiency virus infection) ([place]) 09/07/2019   Hemorrhagic cyst of left ovary 10/14/2016   Abdominal pain 04/16/2016   MRSA colonization 09/25/2014   Major depressive disorder, recurrent episode, moderate ([place]) 06/08/2013   Genital herpes 02/16/2013   Cervical cancer screening 10/10/2010   OBESITY, NOS 08/20/2006     OBJECTIVE:   BP 128/80    Pulse 72    Ht 5\' 3"  (1.6 m)    Wt 185 lb (83.9 kg)    LMP 05/19/2021 (Exact Date)    SpO2 100%    BMI 32.77 kg/m   PHQ-9:  Depression screen [facility] 2/9 06/10/2021 06/10/2021 05/13/2021  Decreased Interest 3 3 1   Down, Depressed, Hopeless 3 3 1   PHQ - 2 Score 6 6 2   Altered sleeping 3 3 2   Tired, decreased energy 1 1 0  Change in appetite 2 2 2   Feeling bad or failure about yourself  2 2 0  Trouble concentrating 2 2 0  Moving slowly or fidgety/restless 0 0 0  Suicidal thoughts 0 0 0  PHQ-9 Score 16 16 6   Difficult doing work/chores Very difficult Very difficult -  Some recent data might be hidden      GAD-7:  GAD 7 : Generalized Anxiety Score 06/10/2021 04/28/2016  Nervous, Anxious, on Edge 3 1  Control/stop worrying 3 1  Worry too much - different things 3 2  Trouble relaxing 3 2  Restless 2 1  Easily annoyed or irritable 3 2  Afraid - awful might happen 2 0  Total GAD 7 Score 19 9  Anxiety Difficulty Very difficult Somewhat difficult     Physical Exam General: Awake, alert, oriented, no acute distress Respiratory: Unlabored respirations, speaking in full sentences, no respiratory distress Extremities: Moving all extremities spontaneously Neuro: Cranial nerves II through X grossly intact Psych: Normal insight and judgement   ASSESSMENT/PLAN:   Major depressive disorder, recurrent episode, moderate New episode.  Depressed and anxious mood, patient feels anxiety dominant recently. No SI/HI plan or intent.  Significant PMH of suicide attempt in 2015 with subsequent hospitalization. - Start Zoloft 50 mg daily - Start hydroxyzine 25 to 50 mg TID PRN for anxiety - Directed to [email or web address] to find counselor for therapy - Given that Zoloft, hydroxyzine, and current HIV meds can all prolong QTC, will obtain EKG today (QTc 412 ms) - Follow-up in 1 month with repeat EKG at that time     Ezequiel Essex, MD [place]

## 2021-06-20 ENCOUNTER — Other Ambulatory Visit (HOSPITAL_COMMUNITY)
Admission: RE | Admit: 2021-06-20 | Discharge: 2021-06-20 | Disposition: A | Discharge status: Home / Self Care | Payer: BC Managed Care – PPO | Source: Ambulatory Visit | Attending: Internal Medicine | Admitting: Internal Medicine

## 2021-06-20 ENCOUNTER — Ambulatory Visit (INDEPENDENT_AMBULATORY_CARE_PROVIDER_SITE_OTHER): Payer: BC Managed Care – PPO | Admitting: Pharmacist

## 2021-06-20 ENCOUNTER — Other Ambulatory Visit: Payer: Self-pay

## 2021-06-20 DIAGNOSIS — Z113 Encounter for screening for infections with a predominantly sexual mode of transmission: Secondary | ICD-10-CM | POA: Diagnosis not present

## 2021-06-20 DIAGNOSIS — B20 Human immunodeficiency virus [HIV] disease: Secondary | ICD-10-CM

## 2021-06-20 MED ORDER — CABOTEGRAVIR & RILPIVIRINE ER 600 & 900 MG/3ML IM SUER
1.0000 | Freq: Once | INTRAMUSCULAR | Status: AC
  Administered 2021-06-20: 09:00:00 1 via INTRAMUSCULAR

## 2021-06-20 NOTE — Progress Notes (Signed)
HPI: Candice [name] is a 35 y.o. female who presents to the [place] clinic for [place] administration.  Patient Active Problem List   Diagnosis Date Noted   Medication monitoring encounter 01/11/2021   MS (multiple sclerosis) ([place]) 05/21/2020   GERD (gastroesophageal reflux disease) 05/21/2020   Right-sided abdominal pain of unknown etiology 04/06/2020   Abnormal MRI, spine 04/06/2020   Metatarsalgia of right foot 04/06/2020   Right leg numbness 02/07/2020   HIV (human immunodeficiency virus infection) ([place]) 09/07/2019   Hemorrhagic cyst of left ovary 10/14/2016   Abdominal pain 04/16/2016   MRSA colonization 09/25/2014   Major depressive disorder, recurrent episode, moderate ([place]) 06/08/2013   Genital herpes 02/16/2013   Cervical cancer screening 10/10/2010   OBESITY, NOS 08/20/2006    Patient's Medications  New Prescriptions   No medications on file  Previous Medications   CABOTEGRAVIR & RILPIVIRINE ER (CABENUVA) 600 & 900 MG/3ML INJECTION    Inject 1 kit into the muscle every 30 (thirty) days.   CYCLOBENZAPRINE (FLEXERIL) 10 MG TABLET    Take 1 tablet (10 mg total) by mouth 3 (three) times daily as needed for muscle spasms.   HYDROXYZINE (ATARAX) 25 MG TABLET    Take 1-2 tablets (25-50 mg total) by mouth 3 (three) times daily as needed.   NAPROXEN SODIUM (ALEVE) 220 MG TABLET    Take 220 mg by mouth daily as needed (For pain).   PSYLLIUM (METAMUCIL) 58.6 % PACKET    Take 1 packet by mouth daily.   SERTRALINE (ZOLOFT) 50 MG TABLET    Take 1 tablet (50 mg total) by mouth daily.   VALACYCLOVIR (VALTREX) 1000 MG TABLET    Take 1 tablet (1,000 mg total) by mouth 2 (two) times daily.  Modified Medications   No medications on file  Discontinued Medications   No medications on file    Allergies: Allergies  Allergen Reactions   Contrast Media [Iodinated Contrast Media] Nausea And Vomiting    Past Medical History: Past Medical History:  Diagnosis Date    Complication of anesthesia    epidural with last pregnancy made entire left side numb, had to d/c   Depression    Headache    HSV 06/28/2007   Hx of migraines     Social History: Social History   Socioeconomic History   Marital status: Single    Spouse name: Not on file   Number of children: 2   Years of education: 14   Highest education level: Not on file  Occupational History    Comment: Ecologist  Tobacco Use   Smoking status: Every Day    Packs/day: 0.25    Types: Cigarettes   Smokeless tobacco: Never  Vaping Use   Vaping Use: Never used  Substance and Sexual Activity   Alcohol use: Not Currently    Comment: occasional   Drug use: Yes    Frequency: 3.0 times per week    Types: Marijuana   Sexual activity: Yes    Partners: Male    Birth control/protection: None    Comment: Decline condoms  Other Topics Concern   Not on file  Social History Narrative   Works as Ecologist.   FOB of both children lives in [place]; will be some what involved but mother is no longer dating him.   Social Determinants of Health   Financial Resource Strain: Not on file  Food Insecurity: Not on file  Transportation Needs: Not on file  Physical Activity:  Not on file  Stress: Not on file  Social Connections: Not on file    Labs: Lab Results  Component Value Date   HIV1RNAQUANT Not Detected 04/16/2021   HIV1RNAQUANT <20 (H) 02/14/2021   HIV1RNAQUANT Not Detected 11/22/2020   CD4TABS 1,026 11/22/2020   CD4TABS 1,078 03/29/2020   CD4TABS 1,233 02/10/2020    RPR and STI Lab Results  Component Value Date   LABRPR Non Reactive 10/25/2020   LABRPR NON-REACTIVE 09/08/2019   LABRPR Non Reactive 09/05/2019   LABRPR Non Reactive 10/16/2017   LABRPR NON REAC 07/13/2015    STI Results GC CT  12/21/2020 Negative Negative  11/22/2020 Negative Negative  10/25/2020 Negative Negative  09/05/2019 Negative Negative  10/16/2017 Negative Negative  06/08/2017 Negative Negative   10/14/2016 Negative Negative  02/15/2016 Negative Negative  07/13/2015 Negative Negative  04/13/2015 Negative Negative  09/24/2013 NG: Negative CT: Negative  02/11/2011 - NEGATIVE  01/29/2010 - NEGATIVE  05/02/2009 - NEGATIVE  09/29/2008 - NEGATIVE  10/12/2007 - NEGATIVE  07/01/2007 - NEGATIVE  12/30/2006 - NEGATIVE    Hepatitis B Lab Results  Component Value Date   HEPBSAB REACTIVE (A) 09/08/2019   HEPBSAG NON-REACTIVE 09/08/2019   HEPBCAB NON-REACTIVE 09/08/2019   Hepatitis C Lab Results  Component Value Date   HEPCAB NON-REACTIVE 09/08/2019   Hepatitis A Lab Results  Component Value Date   HAV BORDERLINE (A) 09/08/2019   Lipids: Lab Results  Component Value Date   CHOL 183 11/22/2020   TRIG 80 11/22/2020   HDL 51 11/22/2020   CHOLHDL 3.6 11/22/2020   VLDL 10 02/14/2013   LDLCALC 115 (H) 11/22/2020    TARGET DATE: The 25th of the month  Assessment: Candice [name] presents today for their maintenance Cabenuva injections. Initial/past injections were tolerated well without issues. No problems with systemic side effects of injections. She did experience mild pain on the right side.   Administered cabotegravir 684m/3mL in left upper outer quadrant of the gluteal muscle. Administered rilpivirine 900 mg/325min the right upper outer quadrant of the gluteal muscle. Monitored for 10 minutes after injection. Injections were tolerated well without issue. She will follow up in 2 months for next set of injections.  She requested a STI check today. No known exposures but she is having some intermittent side pain and wants to make sure.   Plan: - Cabenuva injections administered - HIV RNA, RPR, urine cytology today - Next injections scheduled for 08/19/21 with GrMarya Amslernd 10/15/21 with me - Call with any issues or questions  Bell Carbo L. Calia Napp, PharmD, BCIDP, AAHIVP, CPWampumlinical Pharmacist Practitioner [place] [facility]

## 2021-06-21 LAB — URINE CYTOLOGY ANCILLARY ONLY
Chlamydia: NEGATIVE
Comment: NEGATIVE
Comment: NORMAL
Neisseria Gonorrhea: NEGATIVE

## 2021-06-23 LAB — HIV-1 RNA QUANT-NO REFLEX-BLD
HIV 1 RNA Quant: NOT DETECTED Copies/mL
HIV-1 RNA Quant, Log: NOT DETECTED Log cps/mL

## 2021-06-23 LAB — RPR: RPR Ser Ql: NONREACTIVE

## 2021-06-28 ENCOUNTER — Other Ambulatory Visit: Payer: Self-pay | Admitting: Family

## 2021-06-28 NOTE — Telephone Encounter (Signed)
Okay to refill? 

## 2021-07-02 ENCOUNTER — Other Ambulatory Visit: Payer: Self-pay | Admitting: Family Medicine

## 2021-07-02 DIAGNOSIS — F419 Anxiety disorder, unspecified: Secondary | ICD-10-CM

## 2021-07-02 DIAGNOSIS — R4589 Other symptoms and signs involving emotional state: Secondary | ICD-10-CM

## 2021-07-29 ENCOUNTER — Other Ambulatory Visit (HOSPITAL_COMMUNITY)
Admission: RE | Admit: 2021-07-29 | Discharge: 2021-07-29 | Disposition: A | Discharge status: Home / Self Care | Payer: BC Managed Care – PPO | Source: Ambulatory Visit | Attending: Family Medicine | Admitting: Family Medicine

## 2021-07-29 ENCOUNTER — Encounter: Payer: Self-pay | Admitting: Family Medicine

## 2021-07-29 ENCOUNTER — Other Ambulatory Visit: Payer: Self-pay

## 2021-07-29 ENCOUNTER — Ambulatory Visit (INDEPENDENT_AMBULATORY_CARE_PROVIDER_SITE_OTHER): Payer: BC Managed Care – PPO | Admitting: Family Medicine

## 2021-07-29 VITALS — BP 121/83 | HR 81 | Ht 63.0 in | Wt 193.4 lb

## 2021-07-29 DIAGNOSIS — R399 Unspecified symptoms and signs involving the genitourinary system: Secondary | ICD-10-CM

## 2021-07-29 DIAGNOSIS — N898 Other specified noninflammatory disorders of vagina: Secondary | ICD-10-CM

## 2021-07-29 LAB — POCT WET PREP (WET MOUNT)
Clue Cells Wet Prep Whiff POC: NEGATIVE
Trichomonas Wet Prep HPF POC: ABSENT

## 2021-07-29 LAB — POCT URINALYSIS DIP (MANUAL ENTRY)
Bilirubin, UA: NEGATIVE
Blood, UA: NEGATIVE
Glucose, UA: NEGATIVE mg/dL
Ketones, POC UA: NEGATIVE mg/dL
Leukocytes, UA: NEGATIVE
Nitrite, UA: NEGATIVE
Protein Ur, POC: NEGATIVE mg/dL
Spec Grav, UA: >=1.03 — AB (ref 1.010–1.025)
Urobilinogen, UA: 0.2 E.U./dL
pH, UA: 6 (ref 5.0–8.0)

## 2021-07-29 NOTE — Progress Notes (Signed)
° ° °  SUBJECTIVE:   CHIEF COMPLAINT / HPI:   Pelvic pain  Patient reports several week history of pelvic pain is along with burning with urination and increased urinary frequency.  Denies any new back pain.  Reports she was sexually active approximately 1 month ago and the symptoms have been going on for several weeks.  Denies any fever or systemic symptoms.  Reports that she has also had increased vaginal discharge which was clear in nature.    OBJECTIVE:   BP 121/83    Pulse 81    Ht 5\' 3"  (1.6 m)    Wt 193 lb 6.4 oz (87.7 kg)    LMP 07/12/2021    SpO2 100%    BMI 34.26 kg/m   General: Pleasant 36 year old female in no acute distress Cardiac: Regular rate and rhythm Respiratory: Normal work of breathing Abdomen: Soft, nontender GU: Normal external vaginal tissue, normal internal vaginal tissue with no friability of the cervix.  Moderate amount of clear discharge from cervical os  ASSESSMENT/PLAN:   Vaginal discharge Wet prep negative today.  GC/chlamydia collected.  Will await for those results and treat as needed.  UTI symptoms Urinalysis not consistent with UTI.  Urine culture sent.  Will wait for results from STI testing and consider antibiotics if symptoms do not resolve.  Strict ED precautions given and patient is agreeable.     Gifford Shave, MD [place]

## 2021-07-29 NOTE — Patient Instructions (Addendum)
It was a pleasure seeing you today.  Your urine does not look like you have a UTI but your pelvic exam was concerning for some type of infection.  Nothing has come back positive yet so I would like to wait for the results for that before I prescribe any medications.  I want you to make sure to drink plenty of water and I will call you with the results when they come back.  If you have any questions or concerns call the clinic.  I hope you have a great afternoon!

## 2021-07-30 DIAGNOSIS — R399 Unspecified symptoms and signs involving the genitourinary system: Secondary | ICD-10-CM | POA: Insufficient documentation

## 2021-07-30 NOTE — Assessment & Plan Note (Signed)
Urinalysis not consistent with UTI.  Urine culture sent.  Will wait for results from STI testing and consider antibiotics if symptoms do not resolve.  Strict ED precautions given and patient is agreeable.

## 2021-07-30 NOTE — Assessment & Plan Note (Signed)
Wet prep negative today.  GC/chlamydia collected.  Will await for those results and treat as needed.

## 2021-07-31 LAB — CERVICOVAGINAL ANCILLARY ONLY
Chlamydia: NEGATIVE
Comment: NEGATIVE
Comment: NEGATIVE
Comment: NORMAL
Neisseria Gonorrhea: NEGATIVE
Trichomonas: NEGATIVE

## 2021-07-31 LAB — URINE CULTURE

## 2021-08-19 ENCOUNTER — Other Ambulatory Visit: Payer: Self-pay

## 2021-08-19 ENCOUNTER — Ambulatory Visit (INDEPENDENT_AMBULATORY_CARE_PROVIDER_SITE_OTHER): Payer: BC Managed Care – PPO | Admitting: Family

## 2021-08-19 ENCOUNTER — Encounter: Payer: Self-pay | Admitting: Family

## 2021-08-19 VITALS — BP 118/84 | HR 72 | Temp 98.2°F | Ht 63.0 in | Wt 191.0 lb

## 2021-08-19 DIAGNOSIS — Z21 Asymptomatic human immunodeficiency virus [HIV] infection status: Secondary | ICD-10-CM

## 2021-08-19 DIAGNOSIS — Z0000 Encounter for general adult medical examination without abnormal findings: Secondary | ICD-10-CM

## 2021-08-19 DIAGNOSIS — Z23 Encounter for immunization: Secondary | ICD-10-CM | POA: Diagnosis not present

## 2021-08-19 DIAGNOSIS — B20 Human immunodeficiency virus [HIV] disease: Secondary | ICD-10-CM | POA: Diagnosis not present

## 2021-08-19 MED ORDER — CABOTEGRAVIR & RILPIVIRINE ER 600 & 900 MG/3ML IM SUER
1.0000 | Freq: Once | INTRAMUSCULAR | Status: AC
  Administered 2021-08-19: 1 via INTRAMUSCULAR

## 2021-08-19 NOTE — Assessment & Plan Note (Signed)
Candice [name] continues to have well-controlled virus with good adherence and tolerance to her ART regimen of Cabenuva.  No signs/symptoms of opportunistic infection.  [name] previous lab work and discussed plan of care.  Unlikely Cabenuva resulting in the symptoms she is experiencing.  Check blood work today.  Continue current dose of Cabenuva with injection provided without complication. Plan for follow up in 2 months or sooner if needed with lab work on the same day.

## 2021-08-19 NOTE — Patient Instructions (Signed)
Nice to see you.  We will check your lab work today.  Plan for follow up in 2 months or sooner if needed with lab work on the same day.  Have a great day and stay safe! 

## 2021-08-19 NOTE — Assessment & Plan Note (Signed)
·   Discussed importance of safe sexual practices and condom use.  Condoms offered.  Menveo updated.  Pneumococcal vaccination declined  Encouraged to complete routine dental care and can refer to [facility] if necessary.

## 2021-08-19 NOTE — Progress Notes (Signed)
Brief Narrative   Patient ID: Candice [name], female    DOB: [birth year], 36 y.o.   MRN: [id number]  Candice [name] is a 36 y/o female diagnosed with HIV-1 disease diagnosed in March 2021 with risk factor of heterosexual contact. Initial CD4 count was 757 and viral load of 421. Genotype was Subtype B with no significant medication resistant mutations. No history of opportunistic infection. [id number] negative. Entered care at [facility] Stage 1. Previous ART exposure to [employer].   Subjective:    Chief Complaint  Patient presents with   Follow-up    HPI:  Candice [name] is a 36 y.o. female with HIV disease and MS last seen on 06/20/21 by Magda Kiel, PharmD, CPP for injection of Cabenuva. Viral load at the time was undetectable. Here today for routine follow up.   Candice [name] has been doing well since her last injection with some soreness for about a day. Has noted increased swelling in her hands with accompanying weakness at times as well as headaches, some issues with memory and bruising. Has questions if this can be related to [place].   Candice [name] denies feelings of being down, depressed or hopeless recently. Drinks alcohol on occasion with daily marijuana and tobacco use. Condoms offered. Healthcare maintenance due includes pneumococcal vaccination and Menveo.  Allergies  Allergen Reactions   Contrast Media [Iodinated Contrast Media] Nausea And Vomiting      Outpatient Medications Prior to Visit  Medication Sig Dispense Refill   cabotegravir & rilpivirine ER (CABENUVA) 600 & 900 MG/3ML injection Inject 1 kit into the muscle every 30 (thirty) days. 6 mL 1   cyclobenzaprine (FLEXERIL) 10 MG tablet Take 1 tablet (10 mg total) by mouth 3 (three) times daily as needed for muscle spasms. 30 tablet 2   hydrOXYzine (ATARAX) 25 MG tablet Take 1-2 tablets (25-50 mg total) by mouth 3 (three) times daily as needed. 30 tablet 0   naproxen sodium (ALEVE) 220 MG tablet Take 220 mg by  mouth daily as needed (For pain).     psyllium (METAMUCIL) 58.6 % packet Take 1 packet by mouth daily.     valACYclovir (VALTREX) 1000 MG tablet TAKE 1 TABLET BY MOUTH TWICE A DAY 10 tablet 0   sertraline (ZOLOFT) 50 MG tablet TAKE 1 TABLET BY MOUTH EVERY DAY (Patient not taking: Reported on 08/19/2021) 90 tablet 1   No facility-administered medications prior to visit.     Past Medical History:  Diagnosis Date   Complication of anesthesia    epidural with last pregnancy made entire left side numb, had to d/c   Depression    Headache    HSV 06/28/2007   Hx of migraines      Past Surgical History:  Procedure Laterality Date   BREAST SURGERY  2019   reduction   CESAREAN SECTION     CESAREAN SECTION  09/07/2011   Procedure: CESAREAN SECTION;  Surgeon: Florian Buff, MD;  Location: [facility];  Service: Gynecology;  Laterality: N/A;  Repeat cesarean section with delivery of baby boy at 21. Apgars 9/9.  Bilateral tubal ligation.   IUD REMOVAL  2005   in cervix   TUBAL LIGATION     tubal reanastomosis  08/13/2016      Review of Systems  Constitutional:  Negative for appetite change, chills, diaphoresis, fatigue, fever and unexpected weight change.  Eyes:        Negative for acute change in vision  Respiratory:  Negative for chest  tightness, shortness of breath and wheezing.   Cardiovascular:  Negative for chest pain.  Gastrointestinal:  Negative for diarrhea, nausea and vomiting.  Genitourinary:  Negative for dysuria, pelvic pain and vaginal discharge.  Musculoskeletal:  Negative for neck pain and neck stiffness.  Skin:  Negative for rash.  Neurological:  Negative for seizures, syncope, weakness and headaches.  Hematological:  Negative for adenopathy. Does not bruise/bleed easily.  Psychiatric/Behavioral:  Negative for hallucinations.      Objective:    BP 118/84    Pulse 72    Temp 98.2 F (36.8 C) (Oral)    Ht $R'5\' 3"'Kz$  (1.6 m)    Wt 191 lb (86.6 kg)    SpO2 100%    BMI 33.83  kg/m  Nursing note and vital signs reviewed.  Physical Exam Constitutional:      General: She is not in acute distress.    Appearance: She is well-developed.  Eyes:     Conjunctiva/sclera: Conjunctivae normal.  Cardiovascular:     Rate and Rhythm: Normal rate and regular rhythm.     Heart sounds: Normal heart sounds. No murmur heard.   No friction rub. No gallop.  Pulmonary:     Effort: Pulmonary effort is normal. No respiratory distress.     Breath sounds: Normal breath sounds. No wheezing or rales.  Chest:     Chest wall: No tenderness.  Abdominal:     General: Bowel sounds are normal.     Palpations: Abdomen is soft.     Tenderness: There is no abdominal tenderness.  Musculoskeletal:     Cervical back: Neck supple.  Lymphadenopathy:     Cervical: No cervical adenopathy.  Skin:    General: Skin is warm and dry.     Findings: No rash.  Neurological:     Mental Status: She is alert and oriented to person, place, and time.  Psychiatric:        Behavior: Behavior normal.        Thought Content: Thought content normal.        Judgment: Judgment normal.     Depression screen [facility] 2/9 08/19/2021 06/10/2021 06/10/2021 05/13/2021 04/16/2021  Decreased Interest 0 $RemoveBe'3 3 1 'EyZWGmAGa$ 0  Down, Depressed, Hopeless 0 $RemoveBe'3 3 1 1  'OAfsATAwM$ PHQ - 2 Score 0 $Remov'6 6 2 1  'UcmMbQ$ Altered sleeping - $RemoveBe'3 3 2 'ILqTOxGFX$ -  Tired, decreased energy - 1 1 0 -  Change in appetite - $RemoveBe'2 2 2 'iLAFdyJnI$ -  Feeling bad or failure about yourself  - 2 2 0 -  Trouble concentrating - 2 2 0 -  Moving slowly or fidgety/restless - 0 0 0 -  Suicidal thoughts - 0 0 0 -  PHQ-9 Score - $Remov'16 16 6 'DBiHMH$ -  Difficult doing work/chores - Very difficult Very difficult - -  Some recent data might be hidden       Assessment & Plan:    Patient Active Problem List   Diagnosis Date Noted   UTI symptoms 07/30/2021   Medication monitoring encounter 01/11/2021   MS (multiple sclerosis) ([place]) 05/21/2020   GERD (gastroesophageal reflux disease) 05/21/2020   Right-sided abdominal  pain of unknown etiology 04/06/2020   Abnormal MRI, spine 04/06/2020   Metatarsalgia of right foot 04/06/2020   Healthcare maintenance 02/10/2020   Right leg numbness 02/07/2020   HIV (human immunodeficiency virus infection) ([place]) 09/07/2019   Hemorrhagic cyst of left ovary 10/14/2016   Abdominal pain 04/16/2016   MRSA colonization 09/25/2014   Major depressive disorder,  recurrent episode, moderate ([place]) 06/08/2013   Genital herpes 02/16/2013   Vaginal discharge 10/10/2010   Cervical cancer screening 10/10/2010   OBESITY, NOS 08/20/2006     Problem List Items Addressed This Visit       Other   HIV (human immunodeficiency virus infection) ([place])    Candice [name] continues to have well-controlled virus with good adherence and tolerance to her ART regimen of Cabenuva.  No signs/symptoms of opportunistic infection.  [name] previous lab work and discussed plan of care.  Unlikely Cabenuva resulting in the symptoms she is experiencing.  Check blood work today.  Continue current dose of Cabenuva with injection provided without complication. Plan for follow up in 2 months or sooner if needed with lab work on the same day.       Relevant Orders   Comprehensive metabolic panel   HIV-1 RNA quant-no reflex-bld   T-helper cells (CD4) count (not at [facility])   [place] (Completed)   Healthcare maintenance    Discussed importance of safe sexual practices and condom use.  Condoms offered. Menveo updated.  Pneumococcal vaccination declined Encouraged to complete routine dental care and can refer to [facility] if necessary.       Other Visit Diagnoses     Need for meningococcal vaccination    -  Primary   Relevant Orders   MENINGOCOCCAL MCV4O (Completed)        I have discontinued Candice [name]'s sertraline. I am also having her maintain her naproxen sodium, psyllium, cabotegravir & rilpivirine ER, cyclobenzaprine, hydrOXYzine, and valACYclovir. We administered cabotegravir &  rilpivirine ER.   Meds ordered this encounter  Medications   cabotegravir & rilpivirine ER (CABENUVA) 600 & 900 MG/3ML injection 1 kit     Follow-up: Return in about 2 months (around 10/17/2021), or if symptoms worsen or fail to improve.   Terri Piedra, MSN, FNP-C Nurse Practitioner [facility] [place] number: [phone]

## 2021-08-21 LAB — HIV-1 RNA QUANT-NO REFLEX-BLD
HIV 1 RNA Quant: NOT DETECTED Copies/mL
HIV-1 RNA Quant, Log: NOT DETECTED Log cps/mL

## 2021-08-21 LAB — COMPREHENSIVE METABOLIC PANEL
AG Ratio: 1.3 (calc) (ref 1.0–2.5)
ALT: 10 U/L (ref 6–29)
AST: 14 U/L (ref 10–30)
Albumin: 4 g/dL (ref 3.6–5.1)
Alkaline phosphatase (APISO): 42 U/L (ref 31–125)
BUN: 12 mg/dL (ref 7–25)
CO2: 27 mmol/L (ref 20–32)
Calcium: 9.2 mg/dL (ref 8.6–10.2)
Chloride: 105 mmol/L (ref 98–110)
Creat: 0.55 mg/dL (ref 0.50–0.97)
Globulin: 3.2 g/dL (calc) (ref 1.9–3.7)
Glucose, Bld: 83 mg/dL (ref 65–99)
Potassium: 4.3 mmol/L (ref 3.5–5.3)
Sodium: 139 mmol/L (ref 135–146)
Total Bilirubin: 0.4 mg/dL (ref 0.2–1.2)
Total Protein: 7.2 g/dL (ref 6.1–8.1)

## 2021-08-21 LAB — T-HELPER CELLS (CD4) COUNT (NOT AT ARMC)
Absolute CD4: 942 cells/uL (ref 490–1740)
CD4 T Helper %: 45 % (ref 30–61)
Total lymphocyte count: 2076 cells/uL (ref 850–3900)

## 2021-09-11 ENCOUNTER — Encounter: Payer: Self-pay | Admitting: Student

## 2021-09-11 ENCOUNTER — Other Ambulatory Visit: Payer: Self-pay

## 2021-09-11 ENCOUNTER — Ambulatory Visit (INDEPENDENT_AMBULATORY_CARE_PROVIDER_SITE_OTHER): Payer: BC Managed Care – PPO | Admitting: Student

## 2021-09-11 VITALS — BP 124/80 | HR 83 | Ht 63.0 in | Wt 193.4 lb

## 2021-09-11 DIAGNOSIS — R350 Frequency of micturition: Secondary | ICD-10-CM | POA: Diagnosis not present

## 2021-09-11 DIAGNOSIS — G35 Multiple sclerosis: Secondary | ICD-10-CM

## 2021-09-11 HISTORY — DX: Frequency of micturition: R35.0

## 2021-09-11 NOTE — Progress Notes (Signed)
? ? ?SUBJECTIVE:  ? ?CHIEF COMPLAINT / HPI:  ? ?Multiple sclerosis ?Pt states symptoms started around November including tingling hands/arms bilaterally (worse in R), muscle weakness in arms and legs. Used to be just legs that were symptomatic when first diagnosed in 2021. This is the first time arms have been affected. She also notes issues with memory, is very forgetful. Will stop in the middle of a conversation because she cant remember what she was saying. Feels like her brain is foggy. Can't remember what she did last week. Vision is blurry intermittently and comes along with pressure headaches usually on the L side of head.  ? ?Pt last saw neurologist on 08/13/20 who recommended repeat brain MRI in 6 months then consider adding avonex or plegridy. Pt was unable to make appointment and was told she would be charged for the missed apt and did not reschedule.  ? ?Very occasional alcohol use, smokes 4-5 cigarettes/day, and marijuana daily no other recreational drugs.  ? ?Frequent urination and thirst ?Pt states she has been urinating more frequently and very thirsty over past 6 months. Is waking up at night to drink water. Gets up about 2 times a night to pee.  ? ?Lesions in groin area ?Near the end of the visit patient states she had tender boil like lesions in the groin area which she  experienced several years ago and was given an ointment for.  States the lesions feel hard like they are not able to be drained but are very tender to touch. ? ? ? ? ? ?PERTINENT  PMH / PSH:  ?Patient Active Problem List  ? Diagnosis Date Noted  ? UTI symptoms 07/30/2021  ? Medication monitoring encounter 01/11/2021  ? MS (multiple sclerosis) ([place]) 05/21/2020  ? GERD (gastroesophageal reflux disease) 05/21/2020  ? Right-sided abdominal pain of unknown etiology 04/06/2020  ? Abnormal MRI, spine 04/06/2020  ? Metatarsalgia of right foot 04/06/2020  ? Healthcare maintenance 02/10/2020  ? Right leg numbness 02/07/2020  ? HIV (human  immunodeficiency virus infection) ([place]) 09/07/2019  ? Hemorrhagic cyst of left ovary 10/14/2016  ? Abdominal pain 04/16/2016  ? MRSA colonization 09/25/2014  ? Major depressive disorder, recurrent episode, moderate ([place]) 06/08/2013  ? Genital herpes 02/16/2013  ? Vaginal discharge 10/10/2010  ? Cervical cancer screening 10/10/2010  ? OBESITY, NOS 08/20/2006  ? ? ? ?OBJECTIVE:  ? ?Vitals:  ? 09/11/21 1432  ?BP: 124/80  ?Pulse: 83  ?SpO2: 100%  ? ? ? ?General: NAD, pleasant, able to participate in exam ?Cardiac: RRR, no murmurs. ?Respiratory: CTAB, normal effort, No wheezes, rales or rhonchi ?MSK: 5/5 muscle strength in bilateral upper and lower extremities ?Neuro: Cranial nerves II through XII intact, sensation intact, finger-nose-finger test normal ?Psych: Normal affect and mood ? ?ASSESSMENT/PLAN:  ? ?Multiple sclerosis ?New symptoms are likely related to worsening MS.  Patient prefers to establish at a new neurologist. ?-referral sent to [facility] neurology ? ?Frequent urination and thirst ?CMP last month showed glucose of 83 which is nonconcerning for diabetes.  Advised patient to keep a diary for few days noting down how often she is urinating and keeping track of exactly how much water she is drinking each day and to bring that to her next appointment with me.  If patient appears to have polydipsia/polyuria, will further evaluate. ? ?Possible Folliculitis ?Unfortunately due to time we were unable to fully discuss this and I was unable to examine the area.  Advised patient to set up an appointment in the  very near future to have this examined.  Her description sounds like it could be folliculitis but would want to be sure before treating. ? ?Dr. Precious Gilding, DO ?[facility]  ? ? ? ? ?

## 2021-09-11 NOTE — Patient Instructions (Signed)
It was great to see you! Thank you for allowing me to participate in your care! ? ?I recommend that you always bring your medications to each appointment as this makes it easy to ensure you are on the correct medications and helps [place] not miss when refills are needed. ? ?Our plans for today:  ?- I have referred you to [facility]. You will receive a phone call to schedule an appointment ?- Please keep a diary of your water intake and how often you are urinating per day for a few days ?-Please schedule another visit ASAP to discuss other issues ? ? ? ?Take care and seek immediate care sooner if you develop any concerns.  ? ?Dr. Precious Gilding, DO ?[facility] ? ?

## 2021-09-11 NOTE — Assessment & Plan Note (Signed)
CMP last month showed glucose of 83 which is nonconcerning for diabetes.  Advised patient to keep a diary for few days noting down how often she is urinating and keeping track of exactly how much water she is drinking each day and to bring that to her next appointment with me.  If patient appears to have polydipsia/polyuria, will further evaluate. ?

## 2021-09-11 NOTE — Assessment & Plan Note (Signed)
Per patient request, referral sent to new neurologist, [place] [facility] ?

## 2021-09-12 ENCOUNTER — Encounter: Payer: Self-pay | Admitting: Neurology

## 2021-09-18 ENCOUNTER — Ambulatory Visit (INDEPENDENT_AMBULATORY_CARE_PROVIDER_SITE_OTHER): Payer: BC Managed Care – PPO | Admitting: Family Medicine

## 2021-09-18 ENCOUNTER — Encounter: Payer: Self-pay | Admitting: Family Medicine

## 2021-09-18 DIAGNOSIS — N907 Vulvar cyst: Secondary | ICD-10-CM | POA: Diagnosis not present

## 2021-09-18 NOTE — Patient Instructions (Signed)
This does not look like an infected boil to me.  Antibiotic and warm compresses are not going to help.  It seems to be a non-infected cyst (an oil gland that has lost its connection to the surface.)  These typically do not go away on their own and do not drain on their own.  Let me know if it starts looking infected or if you would like it cut out.  It is a bit messy to cut it out.   ?

## 2021-09-19 ENCOUNTER — Encounter: Payer: Self-pay | Admitting: Family Medicine

## 2021-09-19 DIAGNOSIS — N907 Vulvar cyst: Secondary | ICD-10-CM | POA: Insufficient documentation

## 2021-09-19 NOTE — Assessment & Plan Note (Signed)
Cyst, not boil.  Small and asymptomatic.  Educated.  I discouraged elective removal as unnecessary.  She agreed.   ?

## 2021-09-19 NOTE — Progress Notes (Signed)
? ? ?  SUBJECTIVE:  ? ?CHIEF COMPLAINT / HPI:  ? ?Concern for a boil. ?Has boil in groin area - a lump present for weeks.  Not growing.  Painless.  No fever.  Low risk for sTD.   ? ? ? ?OBJECTIVE:  ? ?BP 116/81   Pulse 79   Wt 192 lb 12.8 oz (87.5 kg)   LMP 09/07/2021   SpO2 100%   BMI 34.15 kg/m?   ?Patient directs me to a small, firm pea sized subcu nodule on her left inner thigh.  None red, fluctuant or tender ? ?ASSESSMENT/PLAN:  ? ?Cyst of vulva ?Cyst, not boil.  Small and asymptomatic.  Educated.  I discouraged elective removal as unnecessary.  She agreed.   ?  ? ? ?Zenia Resides, MD ?[facility]  ?

## 2021-10-14 ENCOUNTER — Other Ambulatory Visit: Payer: Self-pay

## 2021-10-14 ENCOUNTER — Other Ambulatory Visit: Payer: Self-pay | Admitting: Family

## 2021-10-14 MED ORDER — VALACYCLOVIR HCL 1 G PO TABS: 1000.0000 mg | ORAL_TABLET | Freq: Two times a day (BID) | ORAL | 2 refills | Status: DC

## 2021-10-15 ENCOUNTER — Ambulatory Visit (INDEPENDENT_AMBULATORY_CARE_PROVIDER_SITE_OTHER): Payer: BC Managed Care – PPO | Admitting: Pharmacist

## 2021-10-15 ENCOUNTER — Other Ambulatory Visit: Payer: Self-pay

## 2021-10-15 DIAGNOSIS — Z113 Encounter for screening for infections with a predominantly sexual mode of transmission: Secondary | ICD-10-CM | POA: Diagnosis not present

## 2021-10-15 DIAGNOSIS — Z21 Asymptomatic human immunodeficiency virus [HIV] infection status: Secondary | ICD-10-CM | POA: Diagnosis not present

## 2021-10-15 MED ORDER — CABOTEGRAVIR & RILPIVIRINE ER 600 & 900 MG/3ML IM SUER
1.0000 | Freq: Once | INTRAMUSCULAR | Status: AC
  Administered 2021-10-15: 1 via INTRAMUSCULAR

## 2021-10-15 NOTE — Progress Notes (Signed)
? ?HPI: Candice [name] is a 36 y.o. female who presents to the [name] for [facility]ice [name] administration. ? ?Patient Active Problem List  ? Diagnosis Date Noted  ? Cyst of vulva 09/19/2021  ? Frequent urination 09/11/2021  ? UTI symptoms 07/30/2021  ? Medication monitoring encounter 01/11/2021  ? MS (multiple sclerosis) ([place]) 05/21/2020  ? GERD (gastroesophageal reflux disease) 05/21/2020  ? Right-sided abdominal pain of unknown etiology 04/06/2020  ? Abnormal MRI, spine 04/06/2020  ? Metatarsalgia of right foot 04/06/2020  ? Healthcare maintenance 02/10/2020  ? Right leg numbness 02/07/2020  ? HIV (human immunodeficiency virus infection) ([place]) 09/07/2019  ? Hemorrhagic cyst of left ovary 10/14/2016  ? Abdominal pain 04/16/2016  ? MRSA colonization 09/25/2014  ? Major depressive disorder, recurrent episode, moderate ([place]) 06/08/2013  ? Genital herpes 02/16/2013  ? Vaginal discharge 10/10/2010  ? Cervical cancer screening 10/10/2010  ? OBESITY, NOS 08/20/2006  ? ? ?Patient's Medications  ?New Prescriptions  ? No medications on file  ?Previous Medications  ? CABOTEGRAVIR & RILPIVIRINE ER (CABENUVA) 600 & 900 MG/3ML INJECTION    Inject 1 kit into the muscle every 30 (thirty) days.  ? CYCLOBENZAPRINE (FLEXERIL) 10 MG TABLET    Take 1 tablet (10 mg total) by mouth 3 (three) times daily as needed for muscle spasms.  ? HYDROXYZINE (ATARAX) 25 MG TABLET    Take 1-2 tablets (25-50 mg total) by mouth 3 (three) times daily as needed.  ? NAPROXEN SODIUM (ALEVE) 220 MG TABLET    Take 220 mg by mouth daily as needed (For pain).  ? PSYLLIUM (METAMUCIL) 58.6 % PACKET    Take 1 packet by mouth daily.  ? VALACYCLOVIR (VALTREX) 1000 MG TABLET    Take 1 tablet (1,000 mg total) by mouth 2 (two) times daily.  ?Modified Medications  ? No medications on file  ?Discontinued Medications  ? No medications on file  ? ? ?Allergies: ?Allergies  ?Allergen Reactions  ? Contrast Media [Iodinated Contrast Media] Nausea And Vomiting   ? ? ?Past Medical History: ?Past Medical History:  ?Diagnosis Date  ? Complication of anesthesia   ? epidural with last pregnancy made entire left side numb, had to d/c  ? Depression   ? Headache   ? HSV 06/28/2007  ? Hx of migraines   ? ? ?Social History: ?Social History  ? ?Socioeconomic History  ? Marital status: Single  ?  Spouse name: Not on file  ? Number of children: 2  ? Years of education: 18  ? Highest education level: Not on file  ?Occupational History  ?  Comment: Ecologist  ?Tobacco Use  ? Smoking status: Every Day  ?  Packs/day: 0.25  ?  Types: Cigarettes  ? Smokeless tobacco: Never  ? Tobacco comments:  ?  States she has thought about quitting  ?Vaping Use  ? Vaping Use: Never used  ?Substance and Sexual Activity  ? Alcohol use: Yes  ?  Comment: occasional  ? Drug use: Yes  ?  Frequency: 7.0 times per week  ?  Types: Marijuana  ?  Comment: States daily use  ? Sexual activity: Yes  ?  Partners: Male  ?  Birth control/protection: None  ?  Comment: Decline condoms  ?Other Topics Concern  ? Not on file  ?Social History Narrative  ? Works as Ecologist.  ? FOB of both children lives in [place]; will be some what involved but mother is no longer dating him.  ? ?Social  Determinants of Health  ? ?Financial Resource Strain: Not on file  ?Food Insecurity: Not on file  ?Transportation Needs: Not on file  ?Physical Activity: Not on file  ?Stress: Not on file  ?Social Connections: Not on file  ? ? ?Labs: ?Lab Results  ?Component Value Date  ? HIV1RNAQUANT Not Detected 08/19/2021  ? HIV1RNAQUANT Not Detected 06/20/2021  ? HIV1RNAQUANT Not Detected 04/16/2021  ? CD4TABS 1,026 11/22/2020  ? CD4TABS 1,078 03/29/2020  ? CD4TABS 1,233 02/10/2020  ? ? ?RPR and STI ?Lab Results  ?Component Value Date  ? LABRPR NON-REACTIVE 06/20/2021  ? LABRPR Non Reactive 10/25/2020  ? LABRPR NON-REACTIVE 09/08/2019  ? LABRPR Non Reactive 09/05/2019  ? LABRPR Non Reactive 10/16/2017  ? ? ?STI Results GC CT  ?07/29/2021 ? 4:28 PM  Negative   Negative    ?06/20/2021 ? 9:20 AM Negative   Negative    ?12/21/2020 ? 3:45 PM Negative   Negative    ?11/22/2020 ? 9:58 AM Negative   Negative    ?10/25/2020 ? 9:52 AM Negative   Negative    ?09/05/2019 ? 4:31 PM Negative   Negative    ?10/16/2017 ?12:00 AM Negative   Negative    ?06/08/2017 ?12:00 AM Negative   Negative    ?10/14/2016 ?12:00 AM Negative   Negative    ?02/15/2016 ?12:00 AM Negative   Negative    ?07/13/2015 ?12:00 AM Negative   Negative    ?04/13/2015 ?12:00 AM Negative   Negative    ?09/24/2013 ?12:00 AM NG: Negative   CT: Negative    ?02/11/2011 ? 3:36 PM  NEGATIVE    ?01/29/2010 ? 7:00 PM  NEGATIVE    ?05/02/2009 ? 8:17 PM  NEGATIVE    ?09/29/2008 ? 9:16 PM  NEGATIVE    ?10/12/2007 ?10:55 PM  NEGATIVE    ?07/01/2007 ?10:39 PM  NEGATIVE    ?12/30/2006 ? 9:09 PM  NEGATIVE    ? ? ?Hepatitis B ?Lab Results  ?Component Value Date  ? HEPBSAB REACTIVE (A) 09/08/2019  ? HEPBSAG NON-REACTIVE 09/08/2019  ? HEPBCAB NON-REACTIVE 09/08/2019  ? ?Hepatitis C ?Lab Results  ?Component Value Date  ? HEPCAB NON-REACTIVE 09/08/2019  ? ?Hepatitis A ?Lab Results  ?Component Value Date  ? HAV BORDERLINE (A) 09/08/2019  ? ?Lipids: ?Lab Results  ?Component Value Date  ? CHOL 183 11/22/2020  ? TRIG 80 11/22/2020  ? HDL 51 11/22/2020  ? CHOLHDL 3.6 11/22/2020  ? VLDL 10 02/14/2013  ? LDLCALC 115 (H) 11/22/2020  ? ? ?TARGET DATE: ?The 25th of the month  ? ?Assessment: ?Candice [name] presents today for their maintenance Cabenuva injections. Past injections were tolerated well without issues. No problems with systemic side effects of injections. No new partners since last injection; politely declined STI testing today.  ? ?Administered cabotegravir 620m/3mL in left upper outer quadrant of the gluteal muscle. Administered rilpivirine 900 mg/354min the right upper outer quadrant of the gluteal muscle. Injections were tolerated well. Patient will follow up in 2 months for next set of injections. ? ?Plan: ?- Cabenuva injections administered ?-  Check HIV RNA today  ?- Next injections scheduled for 6/23 with GrMarya Amslernd 8/25 with Candice [name]?- Call with any issues or questions ? ? ?AmVance PeperPharmD ?PGY1 Pharmacy Resident ?10/15/2021 9:19 AM  ?

## 2021-10-17 LAB — HIV-1 RNA QUANT-NO REFLEX-BLD
HIV 1 RNA Quant: NOT DETECTED copies/mL
HIV-1 RNA Quant, Log: NOT DETECTED Log copies/mL

## 2021-11-26 ENCOUNTER — Encounter: Payer: Self-pay | Admitting: *Deleted

## 2021-12-09 ENCOUNTER — Ambulatory Visit: Payer: BC Managed Care – PPO | Admitting: Family

## 2021-12-13 ENCOUNTER — Encounter: Payer: Self-pay | Admitting: Family

## 2021-12-13 ENCOUNTER — Other Ambulatory Visit (HOSPITAL_COMMUNITY)
Admission: RE | Admit: 2021-12-13 | Discharge: 2021-12-13 | Disposition: A | Discharge status: Home / Self Care | Payer: BC Managed Care – PPO | Source: Ambulatory Visit | Attending: Family | Admitting: Family

## 2021-12-13 ENCOUNTER — Other Ambulatory Visit: Payer: Self-pay

## 2021-12-13 ENCOUNTER — Ambulatory Visit (INDEPENDENT_AMBULATORY_CARE_PROVIDER_SITE_OTHER): Payer: BC Managed Care – PPO | Admitting: Family

## 2021-12-13 VITALS — BP 113/75 | HR 91 | Temp 98.0°F | Wt 189.0 lb

## 2021-12-13 DIAGNOSIS — G35 Multiple sclerosis: Secondary | ICD-10-CM | POA: Diagnosis not present

## 2021-12-13 DIAGNOSIS — Z113 Encounter for screening for infections with a predominantly sexual mode of transmission: Secondary | ICD-10-CM | POA: Diagnosis not present

## 2021-12-13 DIAGNOSIS — Z21 Asymptomatic human immunodeficiency virus [HIV] infection status: Secondary | ICD-10-CM | POA: Diagnosis not present

## 2021-12-13 DIAGNOSIS — Z0000 Encounter for general adult medical examination without abnormal findings: Secondary | ICD-10-CM

## 2021-12-13 MED ORDER — CABOTEGRAVIR & RILPIVIRINE ER 600 & 900 MG/3ML IM SUER
1.0000 | Freq: Once | INTRAMUSCULAR | Status: AC
  Administered 2021-12-13: 1 via INTRAMUSCULAR

## 2021-12-16 LAB — URINE CYTOLOGY ANCILLARY ONLY
Chlamydia: NEGATIVE
Comment: NEGATIVE
Comment: NORMAL
Neisseria Gonorrhea: NEGATIVE

## 2021-12-16 LAB — CYTOLOGY, (ORAL, ANAL, URETHRAL) ANCILLARY ONLY
Chlamydia: NEGATIVE
Comment: NEGATIVE
Comment: NORMAL
Neisseria Gonorrhea: NEGATIVE

## 2021-12-17 LAB — T-HELPER CELLS (CD4) COUNT (NOT AT ARMC)
Absolute CD4: 898 cells/uL (ref 490–1740)
CD4 T Helper %: 47 % (ref 30–61)
Total lymphocyte count: 1912 cells/uL (ref 850–3900)

## 2021-12-17 LAB — HIV-1 RNA QUANT-NO REFLEX-BLD
HIV 1 RNA Quant: NOT DETECTED Copies/mL
HIV-1 RNA Quant, Log: NOT DETECTED Log cps/mL

## 2021-12-17 LAB — COMPREHENSIVE METABOLIC PANEL
AG Ratio: 1.4 (calc) (ref 1.0–2.5)
ALT: 10 U/L (ref 6–29)
AST: 12 U/L (ref 10–30)
Albumin: 4.2 g/dL (ref 3.6–5.1)
Alkaline phosphatase (APISO): 43 U/L (ref 31–125)
BUN: 11 mg/dL (ref 7–25)
CO2: 26 mmol/L (ref 20–32)
Calcium: 9.3 mg/dL (ref 8.6–10.2)
Chloride: 106 mmol/L (ref 98–110)
Creat: 0.67 mg/dL (ref 0.50–0.97)
Globulin: 3.1 g/dL (calc) (ref 1.9–3.7)
Glucose, Bld: 91 mg/dL (ref 65–99)
Potassium: 3.7 mmol/L (ref 3.5–5.3)
Sodium: 140 mmol/L (ref 135–146)
Total Bilirubin: 0.5 mg/dL (ref 0.2–1.2)
Total Protein: 7.3 g/dL (ref 6.1–8.1)

## 2021-12-17 LAB — RPR: RPR Ser Ql: NONREACTIVE

## 2021-12-17 LAB — CK: Total CK: 107 U/L (ref 29–143)

## 2022-01-04 ENCOUNTER — Emergency Department (HOSPITAL_COMMUNITY)
Admission: EM | Admit: 2022-01-04 | Discharge: 2022-01-04 | Disposition: A | Discharge status: Home / Self Care | Payer: BC Managed Care – PPO | Attending: Emergency Medicine | Admitting: Emergency Medicine

## 2022-01-04 ENCOUNTER — Other Ambulatory Visit: Payer: Self-pay

## 2022-01-04 ENCOUNTER — Encounter (HOSPITAL_COMMUNITY): Payer: Self-pay

## 2022-01-04 DIAGNOSIS — S29019A Strain of muscle and tendon of unspecified wall of thorax, initial encounter: Secondary | ICD-10-CM | POA: Diagnosis not present

## 2022-01-04 DIAGNOSIS — Y990 Civilian activity done for income or pay: Secondary | ICD-10-CM | POA: Insufficient documentation

## 2022-01-04 DIAGNOSIS — S29012A Strain of muscle and tendon of back wall of thorax, initial encounter: Secondary | ICD-10-CM | POA: Diagnosis not present

## 2022-01-04 DIAGNOSIS — M546 Pain in thoracic spine: Secondary | ICD-10-CM | POA: Diagnosis not present

## 2022-01-04 DIAGNOSIS — X500XXA Overexertion from strenuous movement or load, initial encounter: Secondary | ICD-10-CM | POA: Diagnosis not present

## 2022-01-04 DIAGNOSIS — Z21 Asymptomatic human immunodeficiency virus [HIV] infection status: Secondary | ICD-10-CM | POA: Diagnosis not present

## 2022-01-04 MED ORDER — OXYCODONE-ACETAMINOPHEN 5-325 MG PO TABS: 1.0000 | ORAL_TABLET | Freq: Three times a day (TID) | ORAL | 0 refills | Status: DC | PRN

## 2022-01-04 MED ORDER — OXYCODONE-ACETAMINOPHEN 5-325 MG PO TABS
1.0000 | ORAL_TABLET | Freq: Once | ORAL | Status: AC
  Administered 2022-01-04: 1 via ORAL
  Filled 2022-01-04: qty 1

## 2022-01-04 MED ORDER — IBUPROFEN 400 MG PO TABS
600.0000 mg | ORAL_TABLET | Freq: Once | ORAL | Status: AC
  Administered 2022-01-04: 600 mg via ORAL
  Filled 2022-01-04: qty 1

## 2022-01-04 MED ORDER — PREDNISONE 10 MG PO TABS: 60.0000 mg | ORAL_TABLET | Freq: Every day | ORAL | 0 refills | Status: AC

## 2022-01-04 MED ORDER — PREDNISONE 20 MG PO TABS
60.0000 mg | ORAL_TABLET | Freq: Once | ORAL | Status: AC
  Administered 2022-01-04: 60 mg via ORAL
  Filled 2022-01-04: qty 3

## 2022-01-04 NOTE — ED Provider Notes (Signed)
[facility] EMERGENCY DEPARTMENT Provider Note   CSN: [id number] Arrival date & time: 01/04/22  1039     History  Chief Complaint  Patient presents with   Back Pain    Candice [name] is a 36 y.o. female present emerged department complaining of back pain.  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  It is significantly worse with any type of movement.  She has had "back issues" for some time but feels that this is more significant.  She does have a history of HIV which she reports is well controlled as well as MS.  She does use Flexeril at home as well as ibuprofen, does not feel that either of these medications can control her pain.  HPI     Home Medications Prior to Admission medications   Medication Sig Start Date End Date Taking? Authorizing Provider  oxyCODONE-acetaminophen (PERCOCET/ROXICET) 5-325 MG tablet Take 1 tablet by mouth every 8 (eight) hours as needed for up to 12 doses for severe pain. 01/04/22  Yes Luciel Brickman, Carola Rhine, MD  predniSONE (DELTASONE) 10 MG tablet Take 6 tablets (60 mg total) by mouth daily for 4 days. 01/05/22 01/09/22 Yes Laval Cafaro, Carola Rhine, MD  cabotegravir & rilpivirine ER (CABENUVA) 600 & 900 MG/3ML injection Inject 1 kit into the muscle every 30 (thirty) days. 12/12/20   Kuppelweiser, Cassie L, RPH-CPP  cyclobenzaprine (FLEXERIL) 10 MG tablet Take 1 tablet (10 mg total) by mouth 3 (three) times daily as needed for muscle spasms. 04/16/21   Crestwood Callas, NP  hydrOXYzine (ATARAX) 25 MG tablet Take 1-2 tablets (25-50 mg total) by mouth 3 (three) times daily as needed. 06/10/21   Ezequiel Essex, MD  naproxen sodium (ALEVE) 220 MG tablet Take 220 mg by mouth daily as needed (For pain).    [provider]   psyllium (METAMUCIL) 58.6 % packet Take 1 packet by mouth daily.    [provider]  valACYclovir (VALTREX) 1000 MG tablet Take 1 tablet (1,000 mg total) by mouth 2 (two) times daily. 10/14/21   Golden Circle, FNP      Allergies    Contrast media [iodinated contrast media]    Review of Systems   Review of Systems  Physical Exam Updated Vital Signs BP 118/82 (BP Location: Left Arm)   Pulse 69   Temp 98.3 F (36.8 C)   Resp 15   Ht _0  (1.6 m)   Wt 85.7 kg   SpO2 100%   BMI 33.48 kg/m  Physical Exam Constitutional:      General: She is not in acute distress. HENT:     Head: Normocephalic and atraumatic.  Eyes:     Conjunctiva/sclera: Conjunctivae normal.     Pupils: Pupils are equal, round, and reactive to light.  Cardiovascular:     Rate and Rhythm: Normal rate and regular rhythm.     Pulses: Normal pulses.  Pulmonary:     Effort: Pulmonary effort is normal. No respiratory distress.     Breath sounds: Normal breath sounds.  Abdominal:     General: There is no distension.     Tenderness: There is no abdominal tenderness.  Musculoskeletal:     Comments: Significant pain with any type of movement  of the torso No spinal midline tenderness.  Midthoracic left-sided tenderness on exam, trigger point tenderness  Skin:    General: Skin is warm and dry.  Neurological:     General: No focal deficit present.     Mental Status: She is alert. Mental status is at baseline.  Psychiatric:        Mood and Affect: Mood normal.        Behavior: Behavior normal.     ED Results / Procedures / Treatments   Labs (all labs ordered are listed, but only abnormal results are displayed) Labs Reviewed - No data to display  EKG None  Radiology No results found.  Procedures Procedures    Medications Ordered in ED Medications  ibuprofen (ADVIL) tablet 600 mg (600 mg Oral Given 01/04/22 1242)  oxyCODONE-acetaminophen (PERCOCET/ROXICET) 5-325 MG per tablet 1 tablet  (1 tablet Oral Given 01/04/22 1242)  predniSONE (DELTASONE) tablet 60 mg (60 mg Oral Given 01/04/22 1242)    ED Course/ Medical Decision Making/ A&P                           Medical Decision Making Risk Prescription drug management.   Patient is here with suspected musculoskeletal injury or strain of the mid back, possible thoracic referred pain or radiculopathy.  I have a lower suspicion for pneumothorax based on her presentation.  Doubt pyelonephritis or kidney infection.  Her pain is so exquisitely positional, does make me suspicious for muscle strain or thoracic disc herniation versus radicular pain.  I can offer her some stronger pain medicine if she is not responding well to Flexeril and Motrin at home, but advised that she continue with these meds as well, in addition to prednisone.  She can also apply heating packs to her back.  She verbalized understanding and agreement with the plan.  I do not see an emergent indication for radiographic imaging in the ED at this time        Final Clinical Impression(s) / ED Diagnoses Final diagnoses:  Strain of thoracic back region    Rx / DC Orders ED Discharge Orders          Ordered    predniSONE (DELTASONE) 10 MG tablet  Daily        01/04/22 1217    oxyCODONE-acetaminophen (PERCOCET/ROXICET) 5-325 MG tablet  Every 8 hours PRN        01/04/22 1217              Wyvonnia Dusky, MD 01/04/22 1639

## 2022-01-04 NOTE — Discharge Instructions (Addendum)
Please follow-up with your primary care provider.  You may have a bulging disc herniation in your thoracic spine.  This is not something that we can easily diagnose in the emergency department, but you may need follow-up for this issue.  You can continue with ibuprofen, Tylenol, muscle relaxers at home, which you told me you already have.  For more severe pain you can take Percocet, which is oxycodone and Tylenol.  This is a narcotic.  Do not drive after taking this medication.  I also prescribed you prednisone which often helps with inflammation and pain from the spine.  Your next dose of prednisone is due tomorrow morning.

## 2022-01-04 NOTE — ED Triage Notes (Addendum)
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 Pt states she was not pulling or lifting anything heavy. Pt states it started as a cramp and now she can barely move.

## 2022-01-07 NOTE — Progress Notes (Unsigned)
NEUROLOGY CONSULTATION NOTE  Candice [name] MRN: [id number] DOB: 12-Aug-1985  Referring provider: Andrena Mews, MD Primary care provider: Precious Gilding, DO  Reason for consult:  multiple sclerosis  Assessment/Plan:   Relapsing-remitting multiple sclerosis Asymptomatic HIV, well-controlled  She may be experiencing an "MS Hug" (involving dermatomes where she had previous MS plaque) which has been responding to the prednisone and muscle relaxer.  As her HIV is well-controlled, but would avoid anti-CD20 monoclonal antibodies - consider Aubagio Check new baseline MRI brain/cervical/thoracic spine with and without contrast - will need prep as she has contrast allergy Follow up after testing to discuss treatment options.     Subjective:  Candice [name] is a 36 year old right-handed female with HIV and migraines presents to establish care for multiple sclerosis.  History supplemented by prior neurologist's and referring provider's notes.    Beginning around 2018, she began having intermittent episodes of numbness, weakness and dizziness.  She was diagnosed with HIV in March 2021 and started on [place].  In May or June of that year, she began having a recurrence of symptoms, including right flank pain and numbness radiating down her right leg with some associated right leg weakness.  She felt off balance.  She saw neurology at that time.  MRI of thoracic spine revealed subtle enhancing hyperintense focus at the T8-T9 region.  Follow up MRI of brain revealed multiple scattered patchy nonenhancing hyperintense lesions within the cerebral white matter.She underwent lumbar puncture in November 2021 which revealed cell count 0, protein 23, glucose 59, negative culture, negative VDRL but >5 oligoclonal bands not in corresponding serum.  Formally diagnosed with MS.  Neurology suspected that as viral load became controlled with antiviral therapy with increased CD4 counts, she had an exacerbation.   As case reports found that treatment for HIV may provide a protective benefit on MS progression, neurology considered monitoring vs changing antiviral therpay to Atripla vs starting a low-dose interferon beta-1a. She missed her follow up appointment with neurology.  Symptoms improved once HIV became controlled.  Since early 2023, notices gradual progression of symptoms  Currently taking Cabenuva.  Viral load undetectable.  CD4 count 898.    Since then, she has experienced progression of symptoms: Vision:  intermittent blurred vision.  Recent eye exam revealed mildly worse vision but no optic neuritis Motor:  weakness in arms and legs Sensory:  numbness and tingling wrapping around her abdomen Pain:  Muscle cramps. CK 107.  Pain in fingers, wrists, knees, back pain.  Was just seen in ED for left sided back pain/spasms.  Given a course of prednisone and Flexeril for acute back spasms, which has been helping. Gait:  okay Bowel/Bladder:  constipation.   Fatigue:  yes Cognition:  forgetful.  Cannot remember what she was saying during conversations.  May forget things that happened earlier that day.  Brain fog. Mood:  good. Intermittent left-sided head pressure along with intermittent blurred vision. Sleep:  Poor.  Trouble staying asleep.  Goes to bed at 9-10 PM.  Wakes up at 3 AM and cannot fall back asleep.  Current DMT:  None Past DMT:  None Current medications:  Cabenuva (HIV), Flexeril 71m TID PRN (spasms), hydroxyzine, naproxen 2241mdaily PRN (pain), oxycodone-acetaminophen (pain), valacyclovir Past medications:  Robaxin, sertraline, tramadol, Ambien  04/30/2020 MRI C-SPINE W WO:  Unremarkable MRI cervical spine (with and without).  No intrinsic, compressive or abnormal enhancing spinal cord lesions. 04/22/2020 MRI BRAIN W WO:  Multiple scattered patchy T2/FLAIR hyperintensities involving  the supratentorial cerebral white matter, nonspecific, but most suspicious for possible demyelinating  disease/multiple sclerosis. No evidence for active demyelination. 02/25/2020 MRI T-SPINE W WO:  1.   There is a T2 hyperintense focus with subtle enhancement adjacent to T8-T9 in the right posterolateral spinal cord. Though nonspecific, this is most likely to represent a focus of demyelination or inflammation.  Consider MRI of the brain to determine if there is evidence of demyelination elsewhere.  2.   No spinal degenerative changes. 02/11/2020 MRI L-SPINE W WO:  Central disc protrusion at L5-S1, contacting the thecal sac and S1 root sleeves as they bud from the thecal sac. Nerve compression is not demonstrated. This abnormality could result in nerve irritation.  Mild facet osteoarthritis at L4-5 and L5-S1 that could contribute to low back pain.   Family history:  Sister has MS.  Maternal grandmother may have had it (based on symptoms but patient unsure if she was evaluated or diagnosed)  PAST MEDICAL HISTORY: Past Medical History:  Diagnosis Date   Complication of anesthesia    epidural with last pregnancy made entire left side numb, had to d/c   Depression    Headache    HSV 06/28/2007   Hx of migraines     PAST SURGICAL HISTORY: Past Surgical History:  Procedure Laterality Date   BREAST SURGERY  2019   reduction   CESAREAN SECTION     CESAREAN SECTION  09/07/2011   Procedure: CESAREAN SECTION;  Surgeon: Florian Buff, MD;  Location: [facility];  Service: Gynecology;  Laterality: N/A;  Repeat cesarean section with delivery of baby boy at 36. Apgars 9/9.  Bilateral tubal ligation.   IUD REMOVAL  2005   in cervix   TUBAL LIGATION     tubal reanastomosis  08/13/2016    MEDICATIONS: Current Outpatient Medications on File Prior to Visit  Medication Sig Dispense Refill   cabotegravir & rilpivirine ER (CABENUVA) 600 & 900 MG/3ML injection Inject 1 kit into the muscle every 30 (thirty) days. 6 mL 1   cyclobenzaprine (FLEXERIL) 10 MG tablet Take 1 tablet (10 mg total) by mouth 3 (three)  times daily as needed for muscle spasms. 30 tablet 2   hydrOXYzine (ATARAX) 25 MG tablet Take 1-2 tablets (25-50 mg total) by mouth 3 (three) times daily as needed. 30 tablet 0   naproxen sodium (ALEVE) 220 MG tablet Take 220 mg by mouth daily as needed (For pain).     oxyCODONE-acetaminophen (PERCOCET/ROXICET) 5-325 MG tablet Take 1 tablet by mouth every 8 (eight) hours as needed for up to 12 doses for severe pain. 12 tablet 0   predniSONE (DELTASONE) 10 MG tablet Take 6 tablets (60 mg total) by mouth daily for 4 days. 24 tablet 0   psyllium (METAMUCIL) 58.6 % packet Take 1 packet by mouth daily.     valACYclovir (VALTREX) 1000 MG tablet Take 1 tablet (1,000 mg total) by mouth 2 (two) times daily. 10 tablet 2   No current facility-administered medications on file prior to visit.    ALLERGIES: Allergies  Allergen Reactions   Contrast Media [Iodinated Contrast Media] Nausea And Vomiting    FAMILY HISTORY: Family History  Problem Relation Age of Onset   Alcohol abuse Mother    Drug abuse Mother    Depression Mother    Alcohol abuse Father    Drug abuse Father    Mental illness Sister        bipolar ptsd   Schizophrenia Sister  Diabetes Maternal Grandmother    Hypertension Maternal Grandmother    Cancer Maternal Grandmother        ovarian, breast and lung   Depression Maternal Grandmother     Objective:  Blood pressure 115/69, pulse 85, height 5' 3" (1.6 m), weight 195 lb 12.8 oz (88.8 kg), SpO2 100 %. General: No acute distress.  Patient appears well-groomed.   Head:  Normocephalic/atraumatic Eyes:  fundi examined but not visualized Neck: supple, no paraspinal tenderness, full range of motion Back: slight  lower thoracic tenderness Heart: regular rate and rhythm Lungs: Clear to auscultation bilaterally. Vascular: No carotid bruits. Neurological Exam: Mental status: alert and oriented to person, place, and time, speech fluent and not dysarthric, language intact. Cranial  nerves: CN I: not tested CN II: pupils equal, round and reactive to light, visual fields intact CN III, IV, VI:  full range of motion, no nystagmus, no ptosis CN V: facial sensation intact. CN VII: upper and lower face symmetric CN VIII: hearing intact CN IX, X: gag intact, uvula midline CN XI: sternocleidomastoid and trapezius muscles intact CN XII: tongue midline Bulk & Tone: normal, no fasciculations. Motor:  muscle strength 5/5 throughout Sensation:  Pinprick sensation reduced at the T8-T10 dermatomes.  Vibratory sensation intact. Deep Tendon Reflexes:  2+ throughout,  toes downgoing.   Finger to nose testing:  Without dysmetria.   Heel to shin:  Without dysmetria.   Gait:  Normal station and stride.  Timed 25 foot walk 5.76 seconds.  Romberg negative.    Thank you for allowing me to take part in the care of this patient.  Metta Clines, DO  CC:  Precious Gilding, DO  Andrena Mews, MD

## 2022-01-08 ENCOUNTER — Ambulatory Visit (INDEPENDENT_AMBULATORY_CARE_PROVIDER_SITE_OTHER): Payer: BC Managed Care – PPO | Admitting: Neurology

## 2022-01-08 ENCOUNTER — Other Ambulatory Visit (INDEPENDENT_AMBULATORY_CARE_PROVIDER_SITE_OTHER): Payer: BC Managed Care – PPO

## 2022-01-08 ENCOUNTER — Encounter: Payer: Self-pay | Admitting: Neurology

## 2022-01-08 VITALS — BP 115/69 | HR 85 | Ht 63.0 in | Wt 195.8 lb

## 2022-01-08 DIAGNOSIS — Z21 Asymptomatic human immunodeficiency virus [HIV] infection status: Secondary | ICD-10-CM | POA: Diagnosis not present

## 2022-01-08 DIAGNOSIS — G35 Multiple sclerosis: Secondary | ICD-10-CM

## 2022-01-08 LAB — CBC WITH DIFFERENTIAL/PLATELET
Basophils Absolute: 0 10*3/uL (ref 0.0–0.1)
Basophils Relative: 0.1 % (ref 0.0–3.0)
Eosinophils Absolute: 0 10*3/uL (ref 0.0–0.7)
Eosinophils Relative: 0 % (ref 0.0–5.0)
HCT: 39.8 % (ref 36.0–46.0)
Hemoglobin: 12.8 g/dL (ref 12.0–15.0)
Lymphocytes Relative: 12.5 % (ref 12.0–46.0)
Lymphs Abs: 1.7 10*3/uL (ref 0.7–4.0)
MCHC: 32.1 g/dL (ref 30.0–36.0)
MCV: 85.6 fl (ref 78.0–100.0)
Monocytes Absolute: 0.8 10*3/uL (ref 0.1–1.0)
Monocytes Relative: 5.9 % (ref 3.0–12.0)
Neutro Abs: 11.2 10*3/uL — ABNORMAL HIGH (ref 1.4–7.7)
Neutrophils Relative %: 81.5 % — ABNORMAL HIGH (ref 43.0–77.0)
Platelets: 308 10*3/uL (ref 150.0–400.0)
RBC: 4.65 Mil/uL (ref 3.87–5.11)
RDW: 16.1 % — ABNORMAL HIGH (ref 11.5–15.5)
WBC: 13.7 10*3/uL — ABNORMAL HIGH (ref 4.0–10.5)

## 2022-01-08 LAB — COMPREHENSIVE METABOLIC PANEL
ALT: 11 U/L (ref 0–35)
AST: 13 U/L (ref 0–37)
Albumin: 4.2 g/dL (ref 3.5–5.2)
Alkaline Phosphatase: 40 U/L (ref 39–117)
BUN: 12 mg/dL (ref 6–23)
CO2: 26 mEq/L (ref 19–32)
Calcium: 8.9 mg/dL (ref 8.4–10.5)
Chloride: 103 mEq/L (ref 96–112)
Creatinine, Ser: 0.6 mg/dL (ref 0.40–1.20)
GFR: 114.21 mL/min (ref >60.00)
Glucose, Bld: 96 mg/dL (ref 70–99)
Potassium: 3.7 mEq/L (ref 3.5–5.1)
Sodium: 136 mEq/L (ref 135–145)
Total Bilirubin: 0.3 mg/dL (ref 0.2–1.2)
Total Protein: 7.1 g/dL (ref 6.0–8.3)

## 2022-01-08 LAB — VITAMIN D 25 HYDROXY (VIT D DEFICIENCY, FRACTURES): VITD: 7.65 ng/mL — ABNORMAL LOW (ref 30.00–100.00)

## 2022-01-08 NOTE — Patient Instructions (Signed)
Check MRI of brain, cervical and thoracic spine with and without contrast.  Because you have a contrast allergy, will need to give you a prep beforehand. Check labs:  CBC with diff/PLT, CMP, Vit D, JCV antibody and index, hepatitis panel, Varicella zoster, TB, quantitative immunoglobulin panel Follow up after MRIs and labs completed.

## 2022-01-09 LAB — ACUTE HEP PANEL AND HEP B SURFACE AB
HEPATITIS C ANTIBODY REFILL$(REFL): NONREACTIVE
Hep A IgM: NONREACTIVE
Hep B C IgM: NONREACTIVE
Hepatitis B Surface Ag: NONREACTIVE

## 2022-01-09 LAB — VARICELLA ZOSTER ABS, IGG/IGM
Varicella IgM: <0.91 index (ref 0.00–0.90)
Varicella zoster IgG: 2966 index (ref >165)

## 2022-01-09 LAB — IGG, IGA, IGM
IgG (Immunoglobin G), Serum: 1378 mg/dL (ref 600–1640)
IgM, Serum: 67 mg/dL (ref 50–300)
Immunoglobulin A: 279 mg/dL (ref 47–310)

## 2022-01-09 LAB — REFLEX TIQ

## 2022-01-10 ENCOUNTER — Telehealth: Payer: Self-pay

## 2022-01-10 DIAGNOSIS — G35 Multiple sclerosis: Secondary | ICD-10-CM

## 2022-01-13 ENCOUNTER — Ambulatory Visit (INDEPENDENT_AMBULATORY_CARE_PROVIDER_SITE_OTHER): Payer: BC Managed Care – PPO | Admitting: Family Medicine

## 2022-01-13 ENCOUNTER — Encounter: Payer: Self-pay | Admitting: Family Medicine

## 2022-01-13 VITALS — BP 115/78 | HR 96 | Resp 18 | Wt 196.1 lb

## 2022-01-13 DIAGNOSIS — Z3200 Encounter for pregnancy test, result unknown: Secondary | ICD-10-CM

## 2022-01-13 DIAGNOSIS — G8929 Other chronic pain: Secondary | ICD-10-CM

## 2022-01-13 DIAGNOSIS — M545 Low back pain, unspecified: Secondary | ICD-10-CM

## 2022-01-13 DIAGNOSIS — R1032 Left lower quadrant pain: Secondary | ICD-10-CM

## 2022-01-13 DIAGNOSIS — Z8659 Personal history of other mental and behavioral disorders: Secondary | ICD-10-CM | POA: Insufficient documentation

## 2022-01-13 DIAGNOSIS — F320 Major depressive disorder, single episode, mild: Secondary | ICD-10-CM | POA: Insufficient documentation

## 2022-01-13 DIAGNOSIS — R110 Nausea: Secondary | ICD-10-CM | POA: Diagnosis not present

## 2022-01-13 DIAGNOSIS — K5903 Drug induced constipation: Secondary | ICD-10-CM | POA: Diagnosis not present

## 2022-01-13 LAB — POCT URINE PREGNANCY: Preg Test, Ur: NEGATIVE

## 2022-01-13 MED ORDER — SENNOSIDES-DOCUSATE SODIUM 8.6-50 MG PO TABS: 1.0000 | ORAL_TABLET | Freq: Every day | ORAL | 0 refills | Status: DC

## 2022-01-13 MED ORDER — ONDANSETRON HCL 4 MG PO TABS: 4.0000 mg | ORAL_TABLET | Freq: Three times a day (TID) | ORAL | 0 refills | Status: DC | PRN

## 2022-01-13 NOTE — Assessment & Plan Note (Addendum)
-  Recent visit to ED on 7/15, sent home with prednisone and oxycodone -Endorses some recent numbness in her groin area but no loss of bowel/bladder control. Difficult to discern whether this is sequelae of her MS, reassuring that she has an MRI scheduled for next week -Followed by neurology for her MS, MRI of brain/cervical/thoracic spine scheduled next week -Educated on conservative management and red flags

## 2022-01-13 NOTE — Progress Notes (Signed)
    SUBJECTIVE:   CHIEF COMPLAINT / HPI: ED f/u  ED follow up for back pain Visited ED on 7/15 for severe diffuse back pain, sent home with oxycodone and a course of prednisone -Still using oxycodone, but pain has improved since her ED visit -Finished course of prednisone -Using flexeril -Endorses some loss of sensation in her groin, as well as leg pain -Denies bowel/bladder incontinence but endorses constipation and urinary frequency -MRI scheduled for next week, neurology following  Abdominal pain, constipation in the setting of recent oxycodone use Having belly pain, constipation Small BM this morning, small balls Has had constipation issues in the past but recently worsened Tried stool softener without help -Endorses nausea -LLQ and LUQ TTP -No CVA tenderness -No dysuria, hematuria, vaginal bleeding or discharge  PHQ9 score of 7, mild depression -Score of 1 on #1, 2, and 5 -Score of 2 on #3 and 4 -No SI -Pt feels as though these symptoms are largely associated with her abdominal/back pain and MS  PERTINENT  PMH / PSH:  Multiple sclerosis mild facet OA at L4-5 and L5-S1 Central disc protrusion at L5-S1 w/o nerve compression  OBJECTIVE:   BP 115/78   Pulse 96   Resp 18   Wt 196 lb 2 oz (89 kg)   LMP 01/02/2022   SpO2 100%   BMI 34.74 kg/m   Gen: Well appearing MSK: No point tenderness to palpation of vertebrae. +TTP of lumbar paraspinal muscles, worse in left lumbar region. +Pain with passive ROM of back and legs Abd: +TTP LLQ/LUQ, hypoactive BS, no CVA tenderness b/l   ASSESSMENT/PLAN:    Abdominal pain -Likely 2/2 worsening constipation in the setting of recent narcotic use. Endorses colicky pain associated with bowel movements.  -Negative urine pregnancy -Senakot daily until symptoms resolve -Zofran for nausea prn  Depression, major, single episode, mild (HCC) -Likely most related to her recently increased pain. Starting to interfere with her work and  home life. Still in good spirits overall and is hopeful that things will improve. No concern for SI/HI. -PHQ9 of 7 -F/u at next office visit, repeat PHQ9  Back pain -Recent visit to ED on 7/15, sent home with prednisone and oxycodone -Endorses some recent numbness in her groin area but no loss of bowel/bladder control. Difficult to discern whether this is sequelae of her MS, reassuring that she has an MRI scheduled for next week -Followed by neurology for her MS, MRI of brain/cervical/thoracic spine scheduled next week -Educated on conservative management and red flags      August Albino, [place]

## 2022-01-13 NOTE — Assessment & Plan Note (Addendum)
-  Likely 2/2 worsening constipation in the setting of recent narcotic use. Endorses colicky pain associated with bowel movements.  -Negative urine pregnancy -Senakot daily until symptoms resolve -Zofran for nausea prn

## 2022-01-13 NOTE — Assessment & Plan Note (Signed)
-  Likely most related to her recently increased pain. Starting to interfere with her work and home life. Still in good spirits overall and is hopeful that things will improve. No concern for SI/HI. -PHQ9 of 7 -F/u at next office visit, repeat PHQ9

## 2022-01-13 NOTE — Patient Instructions (Addendum)
Today we discussed your back pain and constipation. Please review the attachments about red flag symptoms to look out for in back pain. I hope your MRI next week goes well.  I will send you home with Senakot and Zofran for your constipation and nausea. You can take the Senakot once daily until your constipation resolves, and the Zofran as needed for nausea.  Your urine pregnancy test was negative.  Low back pain is very common. About 1 in 5 people have back pain. The cause of low back pain is rarely dangerous. The pain often gets better over time. About half of people with a sudden onset of back pain feel better in just 2 weeks. About 8 in 10 people feel better by 6 weeks.  CAUSES Some common causes of back pain include: Strain of the muscles or ligaments supporting the spine.  Wear and tear (degeneration) of the spinal discs.  Arthritis.  Direct injury to the back.  HOME CARE INSTRUCTIONS For many people, back pain returns. Since low back pain is rarely dangerous, it is often a condition that people can learn to manage on their own.  Remain active. It is stressful on the back to sit or stand in one place. Do not sit, drive, or stand in one place for more than 30 minutes at a time. Take short walks on level surfaces as soon as pain allows. Try to increase the length of time you walk each day.  Do not stay in bed. Resting more than 1 or 2 days can delay your recovery.  Do not avoid exercise or work. Your body is made to move. It is not dangerous to be active, even though your back may hurt. Your back will likely heal faster if you return to being active before your pain is gone.  Pay attention to your body when you  bend and lift. Many people have less discomfort when lifting if they bend their knees, keep the load close to their bodies, and avoid twisting. Often, the most comfortable positions are those that put less stress on your recovering back.  Find a comfortable position to sleep. Use a firm  mattress and lie on your side with your knees slightly bent. If you lie on your back, put a pillow under your knees.  Only take over-the-counter or prescription medicines as directed by your caregiver. Over-the-counter medicines to reduce pain and inflammation are often the most helpful. Your caregiver may prescribe muscle relaxant drugs. These medicines help dull your pain so you can more quickly return to your normal activities and healthy exercise.  Put ice on the injured area.  Put ice in a plastic bag.  Place a towel between your skin and the bag.  Leave the ice on for 15 to 20 minutes, 3 to 4 times a day for the first 2 to 3 days. After that, ice and heat may be alternated to reduce pain and spasms.  Ask your caregiver about trying back exercises and gentle massage. This may be of some benefit.  Avoid feeling anxious or stressed. Stress increases muscle tension and can worsen back pain. It is important to recognize when you are anxious or stressed and learn ways to manage it. Exercise is a great option.  SEEK IMMEDIATE MEDICAL CARE IF:  You have pain that radiates from your back into your legs.  You develop new bowel or bladder control problems.  You have unusual weakness or numbness in your arms or legs.  You develop nausea  or vomiting.  You develop abdominal pain.  You feel faint.

## 2022-01-15 LAB — QUANTIFERON-TB GOLD PLUS
Mitogen-NIL: 7.51 IU/mL
NIL: 0.01 IU/mL
QuantiFERON-TB Gold Plus: NEGATIVE
TB1-NIL: 0.01 IU/mL
TB2-NIL: 0 IU/mL

## 2022-01-15 LAB — STRATIFY JCV AB (W/ INDEX) W/ RFLX
Index Value: 1.59 — ABNORMAL HIGH
Stratify JCV (TM) Ab w/Reflex Inhibition: POSITIVE — AB

## 2022-01-16 ENCOUNTER — Telehealth: Payer: Self-pay

## 2022-01-16 NOTE — Telephone Encounter (Signed)
PT MRI of thoracic Spine approved authorization number [id number]

## 2022-01-17 NOTE — Addendum Note (Signed)
Addended by: Venetia Night on: 01/17/2022 03:43 PM   Modules accepted: Orders

## 2022-01-17 NOTE — Telephone Encounter (Addendum)
No premedication needed. Patient has allergy to CT contrast not MRI.  Aubagio forms patient is aware to come by on Tuesday after her MRI appt to sign so forms may be faxed over to [place].  Patient advised:Of Dr.Jaffe note, I would like to start Aubagio (which is a pill).  It may affect liver, so we will need to check a liver panel monthly for 6 months (then once every 6 months).  Vitamin D level is very low.  I would like for her to take D3 5000 IU daily.  This should be found over the counter.  she would like to start this medicine.

## 2022-01-18 ENCOUNTER — Ambulatory Visit
Admission: RE | Admit: 2022-01-18 | Discharge: 2022-01-18 | Disposition: A | Discharge status: Home / Self Care | Payer: BC Managed Care – PPO | Source: Ambulatory Visit | Attending: Neurology | Admitting: Neurology

## 2022-01-18 DIAGNOSIS — M4802 Spinal stenosis, cervical region: Secondary | ICD-10-CM | POA: Diagnosis not present

## 2022-01-18 DIAGNOSIS — M50223 Other cervical disc displacement at C6-C7 level: Secondary | ICD-10-CM | POA: Diagnosis not present

## 2022-01-18 DIAGNOSIS — M47812 Spondylosis without myelopathy or radiculopathy, cervical region: Secondary | ICD-10-CM | POA: Diagnosis not present

## 2022-01-18 DIAGNOSIS — G35 Multiple sclerosis: Secondary | ICD-10-CM

## 2022-01-18 MED ORDER — GADOBUTROL 1 MMOL/ML IV SOLN
8.0000 mL | Freq: Once | INTRAVENOUS | Status: AC | PRN
  Administered 2022-01-18: 8 mL via INTRAVENOUS

## 2022-01-21 ENCOUNTER — Ambulatory Visit
Admission: RE | Admit: 2022-01-21 | Discharge: 2022-01-21 | Disposition: A | Discharge status: Home / Self Care | Payer: BC Managed Care – PPO | Source: Ambulatory Visit | Attending: Neurology | Admitting: Neurology

## 2022-01-21 DIAGNOSIS — G35 Multiple sclerosis: Secondary | ICD-10-CM | POA: Diagnosis not present

## 2022-01-21 MED ORDER — GADOBENATE DIMEGLUMINE 529 MG/ML IV SOLN
18.0000 mL | Freq: Once | INTRAVENOUS | Status: AC | PRN
  Administered 2022-01-21: 18 mL via INTRAVENOUS

## 2022-01-23 ENCOUNTER — Telehealth: Payer: Self-pay

## 2022-01-23 DIAGNOSIS — G35 Multiple sclerosis: Secondary | ICD-10-CM

## 2022-01-23 NOTE — Telephone Encounter (Signed)
Patient advised of MRI's recommendations for Liver function test every 6 months, Vitamin D in 6 months.  F/u in 6 months.   Front desk can you call patient to schedule the f/u

## 2022-01-23 NOTE — Telephone Encounter (Signed)
-----   Message from Pieter Partridge, DO sent at 01/23/2022  6:34 AM EDT ----- Overall, MRIs are stable compared to prior imaging.  Perhaps minimal progression in the brain but not significant.  No evidence of MS in spinal cord.  As discussed, we are starting Augabio.  We will need to check liver function tests every month for next 6 months after starting the Aubagio, at which point, I want her to follow up.  As previously discussed, I want her to start D3 5000 IU daily (over the counter).  About a week prior to follow up in 6 months, I want to have a repeat liver function test and vitamin D level

## 2022-01-27 NOTE — Progress Notes (Signed)
Aubagio start form filled out and faxed over 01/22/22.  Fax received from [place], Need copy of patient insurance card.    Copy of card fax.

## 2022-02-01 ENCOUNTER — Other Ambulatory Visit: Payer: Self-pay | Admitting: Infectious Diseases

## 2022-02-06 ENCOUNTER — Telehealth (HOSPITAL_COMMUNITY): Payer: Self-pay | Admitting: Pharmacy Technician

## 2022-02-06 ENCOUNTER — Other Ambulatory Visit (HOSPITAL_COMMUNITY): Payer: Self-pay

## 2022-02-06 NOTE — Telephone Encounter (Signed)
Patient Advocate Encounter  Prior Authorization for Teriflunomide '14MG'$  tablets  has been approved.    PA# [id number] Effective dates: 02/06/2022 through 02/07/2023      Lyndel Safe, [place] Patient Advocate Specialist [place] Patient Advocate Team Direct Number: [phone]  Fax: [phone]

## 2022-02-06 NOTE — Telephone Encounter (Signed)
Patient Advocate Encounter   Received notification that prior authorization for Teriflunomide '14MG'$  tablets is required.   PA submitted on 02/06/2022 Key [id number] Status is pending       Lyndel Safe, [place] Patient Advocate Specialist [place] Patient Advocate Team Direct Number: [phone]  Fax: [phone]

## 2022-02-11 NOTE — Telephone Encounter (Signed)
Tried calling patient no answer. Please send the generic for Aubagio to [facility] in patient chart.

## 2022-02-12 NOTE — Progress Notes (Signed)
NEUROLOGY FOLLOW UP OFFICE NOTE  Candice [name] [id number]  Assessment/Plan:   Relapsing-remitting multiple sclerosis Asymptomatic HIV, well-controlled   Although her HIV is well-controlled, it would still be best to avoid anti-CD20 monoclonal antibodies.  Aubagio would be a good option Start Aubagio 55m daily Continue D3 5000 IU daily Refer to Sports Medicine (Dr. STamala Julianor Dr. JGlennon Mac for consideration of OMT regarding upper and mid back pain After initiation of Aubagio, check hepatic panel monthly for 6 months Repeat vit D level in 6 months. Follow up in 6 months.  Time spent reviewing test results as well as face to face time with patient:  45 minutes.  I answered all questions to the best of my ability.     Subjective:  Candice SIEBENis a 36year old right-handed female with HIV and migraines who follows up to discuss MS medication.    UPDATE: MRIs personally reviewed: 01/21/2022 MRI BRAIN W WO:  Largely stable T2 and FLAIR signal affecting the cerebral hemispheric white matter with minimal involvement of the middle cerebellar peduncle on the left consistent with the clinical diagnosis of multiple sclerosis. There are a few small foci of newly seen signal in the hemispheric white matter as marked by arrows, representing a minimal change. None of these show restricted diffusion or contrast enhancement. 01/18/2022 MRI C-SPINE W WO:  1. Stable and normal MRI appearance of the cervical spinal cord with no evidence for demyelinating disease or abnormal enhancement.  2. Mild cervical spondylosis at C5-6 with resultant mild to moderate bilateral C6 foraminal stenosis. 3. Additional mild spondylosis elsewhere within the cervical spine without significant stenosis or impingement. 01/18/2022 MRI T-SPINE W WO:  1. Stable demyelinating lesion involving the right dorsolateral cord at T8-9. No associated enhancement to suggest active demyelination.  No new lesions to suggest  interval disease progression. 2. Otherwise normal MRI of the thoracic spine.  01/08/2022 LABS:  CBC with WBC 13.7, HGB 12.8, HCT 39.8, PLT 308, ALC 1.7; CBC with Na 136, K 3.7, Cl 103, Co2 26, glucose 96, BUN 12. Cr 0.60, t bili 0.3, ALP 40, AST 13, ALT 11; JCV ab positive with index of 1.59; Quantiferon-TB Gold Plus negative; Vit D level 7.65.    She was advised to start D3 5000 IU daily.  She reports fatigue and cognition improved since starting the D3.  However, her back pain has gotten worse.  She continues to have the uncomfortable wrap around pain in the mid back.  She also now has spasms in the shoulders and upper back as well.  She has been increasing the use of cyclobenzaprine.      HISTORY: Beginning around 2018, she began having intermittent episodes of numbness, weakness and dizziness.  She was diagnosed with HIV in March 2021 and started on [place]  In May or June of that year, she began having a recurrence of symptoms, including right flank pain and numbness radiating down her right leg with some associated right leg weakness.  She felt off balance.  She saw neurology at that time.  MRI of thoracic spine revealed subtle enhancing hyperintense focus at the T8-T9 region.  Follow up MRI of brain revealed multiple scattered patchy nonenhancing hyperintense lesions within the cerebral white matter.She underwent lumbar puncture in November 2021 which revealed cell count 0, protein 23, glucose 59, negative culture, negative VDRL but >5 oligoclonal bands not in corresponding serum.  Formally diagnosed with MS.  Neurology suspected that as viral load became controlled with  antiviral therapy with increased CD4 counts, she had an exacerbation.  As case reports found that treatment for HIV may provide a protective benefit on MS progression, neurology considered monitoring vs changing antiviral therpay to Atripla vs starting a low-dose interferon beta-1a. She missed her follow up appointment with  neurology.  Symptoms improved once HIV became controlled.  Since early 2023, notices gradual progression of symptoms   Currently taking Cabenuva.  Viral load undetectable.  CD4 count 898.     Since then, she has experienced progression of symptoms: Vision:  intermittent blurred vision.  Recent eye exam revealed mildly worse vision but no optic neuritis Motor:  weakness in arms and legs Sensory:  numbness and tingling wrapping around her abdomen Pain:  Muscle cramps. CK 107.  Pain in fingers, wrists, knees, back pain.  Was just seen in ED for left sided back pain/spasms.  Given a course of prednisone and Flexeril for acute back spasms, which has been helping. Gait:  okay Bowel/Bladder:  constipation.   Fatigue:  yes Cognition:  forgetful.  Cannot remember what she was saying during conversations.  May forget things that happened earlier that day.  Brain fog. Mood:  good. Intermittent left-sided head pressure along with intermittent blurred vision. Sleep:  Poor.  Trouble staying asleep.  Goes to bed at 9-10 PM.  Wakes up at 3 AM and cannot fall back asleep.   Current DMT:  None Past DMT:  None Current medications:  Cabenuva (HIV), Flexeril 18m TID PRN (spasms), hydroxyzine, naproxen 2222mdaily PRN (pain), oxycodone-acetaminophen (pain), valacyclovir Past medications:  Robaxin, sertraline, tramadol, Ambien   04/30/2020 MRI C-SPINE W WO:  Unremarkable MRI cervical spine (with and without).  No intrinsic, compressive or abnormal enhancing spinal cord lesions. 04/22/2020 MRI BRAIN W WO:  Multiple scattered patchy T2/FLAIR hyperintensities involving the supratentorial cerebral white matter, nonspecific, but most suspicious for possible demyelinating disease/multiple sclerosis. No evidence for active demyelination. 02/25/2020 MRI T-SPINE W WO:  1.   There is a T2 hyperintense focus with subtle enhancement adjacent to T8-T9 in the right posterolateral spinal cord. Though nonspecific, this is most  likely to represent a focus of demyelination or inflammation.  Consider MRI of the brain to determine if there is evidence of demyelination elsewhere.  2.   No spinal degenerative changes. 02/11/2020 MRI L-SPINE W WO:  Central disc protrusion at L5-S1, contacting the thecal sac and S1 root sleeves as they bud from the thecal sac. Nerve compression is not demonstrated. This abnormality could result in nerve irritation.  Mild facet osteoarthritis at L4-5 and L5-S1 that could contribute to low back pain.     Family history:  Sister has MS.  Maternal grandmother may have had it (based on symptoms but patient unsure if she was evaluated or diagnosed)  PAST MEDICAL HISTORY: Past Medical History:  Diagnosis Date   Complication of anesthesia    epidural with last pregnancy made entire left side numb, had to d/c   Depression    Headache    HSV 06/28/2007   Hx of migraines     MEDICATIONS: Current Outpatient Medications on File Prior to Visit  Medication Sig Dispense Refill   cabotegravir & rilpivirine ER (CABENUVA) 600 & 900 MG/3ML injection Inject 1 kit into the muscle every 30 (thirty) days. 6 mL 1   cyclobenzaprine (FLEXERIL) 10 MG tablet TAKE 1 TABLET BY MOUTH THREE TIMES A DAY AS NEEDED FOR MUSCLE SPASMS 30 tablet 2   hydrOXYzine (ATARAX) 25 MG tablet Take 1-2  tablets (25-50 mg total) by mouth 3 (three) times daily as needed. 30 tablet 0   naproxen sodium (ALEVE) 220 MG tablet Take 220 mg by mouth daily as needed (For pain).     ondansetron (ZOFRAN) 4 MG tablet Take 1 tablet (4 mg total) by mouth every 8 (eight) hours as needed for nausea or vomiting. 20 tablet 0   oxyCODONE-acetaminophen (PERCOCET/ROXICET) 5-325 MG tablet Take 1 tablet by mouth every 8 (eight) hours as needed for up to 12 doses for severe pain. 12 tablet 0   psyllium (METAMUCIL) 58.6 % packet Take 1 packet by mouth daily.     senna-docusate (SENOKOT-S) 8.6-50 MG tablet Take 1 tablet by mouth daily. 14 tablet 0   valACYclovir  (VALTREX) 1000 MG tablet Take 1 tablet (1,000 mg total) by mouth 2 (two) times daily. 10 tablet 2   No current facility-administered medications on file prior to visit.    ALLERGIES: Allergies  Allergen Reactions   Contrast Media [Iodinated Contrast Media] Nausea And Vomiting    FAMILY HISTORY: Family History  Problem Relation Age of Onset   Alcohol abuse Mother    Drug abuse Mother    Depression Mother    Alcohol abuse Father    Drug abuse Father    Multiple sclerosis Sister    Mental illness Sister        bipolar ptsd   Schizophrenia Sister    Diabetes Maternal Grandmother    Hypertension Maternal Grandmother    Cancer Maternal Grandmother        ovarian, breast and lung   Depression Maternal Grandmother       Objective:  Blood pressure 120/83, pulse 89, height _0  (1.651 m), weight 196 lb (88.9 kg), SpO2 100 %. General: No acute distress.  Patient appears well-groomed.   Head:  Normocephalic/atraumatic Back: bilateral tenderness to palpation of trapezius   Metta Clines, DO  CC: Precious Gilding, DO

## 2022-02-13 ENCOUNTER — Ambulatory Visit (INDEPENDENT_AMBULATORY_CARE_PROVIDER_SITE_OTHER): Payer: BC Managed Care – PPO | Admitting: Neurology

## 2022-02-13 ENCOUNTER — Encounter: Payer: Self-pay | Admitting: Neurology

## 2022-02-13 VITALS — BP 120/83 | HR 89 | Ht 65.0 in | Wt 196.0 lb

## 2022-02-13 DIAGNOSIS — G35 Multiple sclerosis: Secondary | ICD-10-CM

## 2022-02-13 DIAGNOSIS — M546 Pain in thoracic spine: Secondary | ICD-10-CM

## 2022-02-13 MED ORDER — TERIFLUNOMIDE 14 MG PO TABS: 14.0000 mg | ORAL_TABLET | Freq: Every day | ORAL | 11 refills | Status: DC

## 2022-02-13 NOTE — Patient Instructions (Signed)
Plan to start

## 2022-02-14 ENCOUNTER — Other Ambulatory Visit (HOSPITAL_COMMUNITY)
Admission: RE | Admit: 2022-02-14 | Discharge: 2022-02-14 | Disposition: A | Discharge status: Home / Self Care | Payer: BC Managed Care – PPO | Source: Ambulatory Visit | Attending: Family | Admitting: Family

## 2022-02-14 ENCOUNTER — Ambulatory Visit (INDEPENDENT_AMBULATORY_CARE_PROVIDER_SITE_OTHER): Payer: BC Managed Care – PPO | Admitting: Pharmacist

## 2022-02-14 ENCOUNTER — Other Ambulatory Visit: Payer: Self-pay

## 2022-02-14 DIAGNOSIS — Z21 Asymptomatic human immunodeficiency virus [HIV] infection status: Secondary | ICD-10-CM

## 2022-02-14 DIAGNOSIS — Z113 Encounter for screening for infections with a predominantly sexual mode of transmission: Secondary | ICD-10-CM | POA: Diagnosis not present

## 2022-02-14 MED ORDER — CABOTEGRAVIR & RILPIVIRINE ER 600 & 900 MG/3ML IM SUER
1.0000 | Freq: Once | INTRAMUSCULAR | Status: AC
  Administered 2022-02-14: 1 via INTRAMUSCULAR

## 2022-02-14 NOTE — Progress Notes (Signed)
HPI: Candice [name] is a 36 y.o. female who presents to the [facility] for [place] administration.  Patient Active Problem List   Diagnosis Date Noted   Depression, major, single episode, mild ([place]) 01/13/2022   Cyst of vulva 09/19/2021   Frequent urination 09/11/2021   UTI symptoms 07/30/2021   Medication monitoring encounter 01/11/2021   MS (multiple sclerosis) ([place]) 05/21/2020   GERD (gastroesophageal reflux disease) 05/21/2020   Right-sided abdominal pain of unknown etiology 04/06/2020   Abnormal MRI, spine 04/06/2020   Metatarsalgia of right foot 04/06/2020   Healthcare maintenance 02/10/2020   Right leg numbness 02/07/2020   HIV (human immunodeficiency virus infection) ([place]) 09/07/2019   Hemorrhagic cyst of left ovary 10/14/2016   Abdominal pain 04/16/2016   MRSA colonization 09/25/2014   Major depressive disorder, recurrent episode, moderate ([place]) 06/08/2013   Genital herpes 02/16/2013   Back pain 10/13/2011   Vaginal discharge 10/10/2010   Cervical cancer screening 10/10/2010   OBESITY, NOS 08/20/2006    Patient's Medications  New Prescriptions   No medications on file  Previous Medications   CABOTEGRAVIR & RILPIVIRINE ER (CABENUVA) 600 & 900 MG/3ML INJECTION    Inject 1 kit into the muscle every 30 (thirty) days.   CYCLOBENZAPRINE (FLEXERIL) 10 MG TABLET    TAKE 1 TABLET BY MOUTH THREE TIMES A DAY AS NEEDED FOR MUSCLE SPASMS   HYDROXYZINE (ATARAX) 25 MG TABLET    Take 1-2 tablets (25-50 mg total) by mouth 3 (three) times daily as needed.   NAPROXEN SODIUM (ALEVE) 220 MG TABLET    Take 220 mg by mouth daily as needed (For pain).   ONDANSETRON (ZOFRAN) 4 MG TABLET    Take 1 tablet (4 mg total) by mouth every 8 (eight) hours as needed for nausea or vomiting.   OXYCODONE-ACETAMINOPHEN (PERCOCET/ROXICET) 5-325 MG TABLET    Take 1 tablet by mouth every 8 (eight) hours as needed for up to 12 doses for severe pain.   PSYLLIUM (METAMUCIL) 58.6 % PACKET    Take  1 packet by mouth daily.   SENNA-DOCUSATE (SENOKOT-S) 8.6-50 MG TABLET    Take 1 tablet by mouth daily.   TERIFLUNOMIDE 14 MG TABS    Take 14 mg by mouth daily.   VALACYCLOVIR (VALTREX) 1000 MG TABLET    Take 1 tablet (1,000 mg total) by mouth 2 (two) times daily.  Modified Medications   No medications on file  Discontinued Medications   No medications on file    Allergies: Allergies  Allergen Reactions   Contrast Media [Iodinated Contrast Media] Nausea And Vomiting    Past Medical History: Past Medical History:  Diagnosis Date   Complication of anesthesia    epidural with last pregnancy made entire left side numb, had to d/c   Depression    Headache    HSV 06/28/2007   Hx of migraines     Social History: Social History   Socioeconomic History   Marital status: Single    Spouse name: Not on file   Number of children: 2   Years of education: 14   Highest education level: Not on file  Occupational History    Comment: Ecologist  Tobacco Use   Smoking status: Every Day    Packs/day: 0.25    Types: Cigarettes   Smokeless tobacco: Never   Tobacco comments:    States she has thought about quitting  Vaping Use   Vaping Use: Never used  Substance and Sexual Activity  Alcohol use: Yes    Comment: occasional   Drug use: Yes    Frequency: 7.0 times per week    Types: Marijuana    Comment: States daily use   Sexual activity: Yes    Partners: Male    Birth control/protection: None    Comment: Decline condoms  Other Topics Concern   Not on file  Social History Narrative   Works as Ecologist.   FOB of both children lives in [place]; will be some what involved but mother is no longer dating him.   Social Determinants of Health   Financial Resource Strain: Not on file  Food Insecurity: Not on file  Transportation Needs: Not on file  Physical Activity: Not on file  Stress: Not on file  Social Connections: Not on file    Labs: Lab Results  Component  Value Date   HIV1RNAQUANT Not Detected 12/13/2021   HIV1RNAQUANT NOT DETECTED 10/15/2021   HIV1RNAQUANT Not Detected 08/19/2021   CD4TABS 1,026 11/22/2020   CD4TABS 1,078 03/29/2020   CD4TABS 1,233 02/10/2020    RPR and STI Lab Results  Component Value Date   LABRPR NON-REACTIVE 12/13/2021   LABRPR NON-REACTIVE 06/20/2021   LABRPR Non Reactive 10/25/2020   LABRPR NON-REACTIVE 09/08/2019   LABRPR Non Reactive 09/05/2019    STI Results GC CT  12/13/2021  9:09 AM Negative    Negative  Negative    Negative   07/29/2021  4:28 PM Negative  Negative   06/20/2021  9:20 AM Negative  Negative   12/21/2020  3:45 PM Negative  Negative   11/22/2020  9:58 AM Negative  Negative   10/25/2020  9:52 AM Negative  Negative   09/05/2019  4:31 PM Negative  Negative   10/16/2017 12:00 AM Negative  Negative   06/08/2017 12:00 AM Negative  Negative   10/14/2016 12:00 AM Negative  Negative   02/15/2016 12:00 AM Negative  Negative   07/13/2015 12:00 AM Negative  Negative   04/13/2015 12:00 AM Negative  Negative   09/24/2013 12:00 AM NG: Negative  CT: Negative   02/11/2011  3:36 PM  NEGATIVE   01/29/2010  7:00 PM  NEGATIVE   05/02/2009  8:17 PM  NEGATIVE   09/29/2008  9:16 PM  NEGATIVE   10/12/2007 10:55 PM  NEGATIVE   07/01/2007 10:39 PM  NEGATIVE     Hepatitis B Lab Results  Component Value Date   HEPBSAB REACTIVE (A) 09/08/2019   HEPBSAG NON-REACTIVE 01/08/2022   HEPBCAB NON-REACTIVE 09/08/2019   Hepatitis C Lab Results  Component Value Date   HEPCAB NON-REACTIVE 09/08/2019   Hepatitis A Lab Results  Component Value Date   HAV BORDERLINE (A) 09/08/2019   Lipids: Lab Results  Component Value Date   CHOL 183 11/22/2020   TRIG 80 11/22/2020   HDL 51 11/22/2020   CHOLHDL 3.6 11/22/2020   VLDL 10 02/14/2013   LDLCALC 115 (H) 11/22/2020    TARGET DATE:  The 25th of the month  Current HIV Regimen: Cabenuva  Assessment: Candice [name] presents today for their maintenance Cabenuva  injections. Initial/past injections were tolerated well without issues. No problems with systemic effects of injections. Patient states she met with her neurologist for further evaluation of symptoms that have not been associated with Cabenuva. Neurologist considering starting Aubagio on patient; she asked if there were any contraindications to starting this with Cabenuva. I do not see any contraindications but will let [name] comment on further if needed.  Administered cabotegravir 600mg /41mL in left  upper outer quadrant of the gluteal muscle. Administered rilpivirine 900 mg/62mL in the right upper outer quadrant of the gluteal muscle. Monitored patient for 10 minutes after injection. Injections were tolerated well without issue. Patient will follow up in 2 months for next injection. Will check HIV RNA today along with urine/oral cytologies per patient request.   Plan: - Cabenuva injections administered - Check HIV RNA and urine/oral cytologies - Next injections scheduled for 10/20 with me, 12/29 with [name], and 2/23 with me  - Call with any issues or questions  Alfonse Spruce, PharmD, CPP, [facility] Pharmacist Practition[facility]

## 2022-02-14 NOTE — Progress Notes (Unsigned)
Candice [name] D.[place] Phone: [phone]   Assessment and Plan:     There are no diagnoses linked to this encounter.  ***   Pertinent previous records reviewed include ***   Follow Up: ***     Subjective:   I, Candice [name], am serving as a Education administrator for Doctor Glennon Mac  Chief Complaint: thoracic back pain   HPI:   02/17/2022 Patient is a 36 year old female complaining of thoracic back pain. Patient states  Relevant Historical Information: ***  Additional pertinent review of systems negative.   Current Outpatient Medications:    cabotegravir & rilpivirine ER (CABENUVA) 600 & 900 MG/3ML injection, Inject 1 kit into the muscle every 30 (thirty) days., Disp: 6 mL, Rfl: 1   cyclobenzaprine (FLEXERIL) 10 MG tablet, TAKE 1 TABLET BY MOUTH THREE TIMES A DAY AS NEEDED FOR MUSCLE SPASMS, Disp: 30 tablet, Rfl: 2   hydrOXYzine (ATARAX) 25 MG tablet, Take 1-2 tablets (25-50 mg total) by mouth 3 (three) times daily as needed., Disp: 30 tablet, Rfl: 0   naproxen sodium (ALEVE) 220 MG tablet, Take 220 mg by mouth daily as needed (For pain)., Disp: , Rfl:    ondansetron (ZOFRAN) 4 MG tablet, Take 1 tablet (4 mg total) by mouth every 8 (eight) hours as needed for nausea or vomiting., Disp: 20 tablet, Rfl: 0   oxyCODONE-acetaminophen (PERCOCET/ROXICET) 5-325 MG tablet, Take 1 tablet by mouth every 8 (eight) hours as needed for up to 12 doses for severe pain., Disp: 12 tablet, Rfl: 0   psyllium (METAMUCIL) 58.6 % packet, Take 1 packet by mouth daily., Disp: , Rfl:    senna-docusate (SENOKOT-S) 8.6-50 MG tablet, Take 1 tablet by mouth daily., Disp: 14 tablet, Rfl: 0   Teriflunomide 14 MG TABS, Take 14 mg by mouth daily., Disp: 30 tablet, Rfl: 11   valACYclovir (VALTREX) 1000 MG tablet, Take 1 tablet (1,000 mg total) by mouth 2 (two) times daily., Disp: 10 tablet, Rfl: 2   Objective:     There were no  vitals filed for this visit.    There is no height or weight on file to calculate BMI.    Physical Exam:    ***   Electronically signed by:  Candice [name] D.[name] 7:46 AM 02/14/22

## 2022-02-15 NOTE — Progress Notes (Signed)
    SUBJECTIVE:   CHIEF COMPLAINT / HPI:   Abdominal pain Patient seen on 01/13/2022 for abdominal pain likely due to constipation in the setting of opioid use or possibly d/t injections pt receives for HIV.  She was advised to take Senokot daily until symptoms resolved and given Zofran for nausea.  Today patient states the Senaokot didn't seem to help. Pt started taking Miralax which helped greatly. She was having normal Bms until her last injection which was 8/25. After that injection she became constipated again, started back on Miraalax which is helping.   Joint pain Pt complains of mild joint pain in wrist and knees which seems to be associated with Cabenuva injections. States the pain is not bad enough to take medications for.   Hard bump on inside of R ankle Pt noticed hard bump on inside of  ankle. It does not hurt unless pressed on  but even then very mild pain.    PERTINENT  PMH / PSH: HIV, MS  OBJECTIVE:   Vitals:   02/18/22 0857  BP: 112/77  Pulse: 90  SpO2: 100%     General: NAD, pleasant, able to participate in exam Cardiac: RRR, no murmurs. Respiratory: CTAB, normal effort, No wheezes, rales or rhonchi Abdomen: Bowel sounds present, nontender, nondistended,soft Skin: warm and dry, no rashes noted Neuro: alert, no obvious focal deficits Psych: Normal affect and mood  ASSESSMENT/PLAN:   Constipation -1/2-1 capful of Miralax daily as needed with goal of having 1 smooth BM every 1-2 days.   Joint pain Pt advised to keep a diary of when she receives the Cabenuva injections and document dates of when joint pain starts and ends to see if there is a pattern. If so, she should show this to her infectious disease doctor and further discuss.   Hard bump on medial malleolus likely bone spur or bony prominence. Reassurance given.   Dr. Precious Gilding, [place]

## 2022-02-17 ENCOUNTER — Encounter: Payer: BC Managed Care – PPO | Admitting: Pharmacist

## 2022-02-17 ENCOUNTER — Ambulatory Visit (INDEPENDENT_AMBULATORY_CARE_PROVIDER_SITE_OTHER): Payer: BC Managed Care – PPO | Admitting: Sports Medicine

## 2022-02-17 VITALS — BP 128/70 | HR 97 | Ht 65.0 in | Wt 196.0 lb

## 2022-02-17 DIAGNOSIS — M546 Pain in thoracic spine: Secondary | ICD-10-CM | POA: Diagnosis not present

## 2022-02-17 DIAGNOSIS — M9901 Segmental and somatic dysfunction of cervical region: Secondary | ICD-10-CM

## 2022-02-17 DIAGNOSIS — G8929 Other chronic pain: Secondary | ICD-10-CM | POA: Diagnosis not present

## 2022-02-17 DIAGNOSIS — M9908 Segmental and somatic dysfunction of rib cage: Secondary | ICD-10-CM

## 2022-02-17 DIAGNOSIS — M9905 Segmental and somatic dysfunction of pelvic region: Secondary | ICD-10-CM

## 2022-02-17 DIAGNOSIS — M9903 Segmental and somatic dysfunction of lumbar region: Secondary | ICD-10-CM | POA: Diagnosis not present

## 2022-02-17 DIAGNOSIS — M9902 Segmental and somatic dysfunction of thoracic region: Secondary | ICD-10-CM | POA: Diagnosis not present

## 2022-02-17 DIAGNOSIS — G35 Multiple sclerosis: Secondary | ICD-10-CM | POA: Diagnosis not present

## 2022-02-17 LAB — CYTOLOGY, (ORAL, ANAL, URETHRAL) ANCILLARY ONLY
Chlamydia: NEGATIVE
Comment: NEGATIVE
Comment: NEGATIVE
Comment: NORMAL
Neisseria Gonorrhea: NEGATIVE
Trichomonas: NEGATIVE

## 2022-02-17 LAB — URINE CYTOLOGY ANCILLARY ONLY
Chlamydia: NEGATIVE
Comment: NEGATIVE
Comment: NEGATIVE
Comment: NORMAL
Neisseria Gonorrhea: NEGATIVE
Trichomonas: NEGATIVE

## 2022-02-17 NOTE — Patient Instructions (Addendum)
Good to see you  Pt referral  2-3 week follow up for repeat MSK

## 2022-02-18 ENCOUNTER — Encounter: Payer: Self-pay | Admitting: Neurology

## 2022-02-18 ENCOUNTER — Encounter: Payer: Self-pay | Admitting: Student

## 2022-02-18 ENCOUNTER — Ambulatory Visit (INDEPENDENT_AMBULATORY_CARE_PROVIDER_SITE_OTHER): Payer: BC Managed Care – PPO | Admitting: Student

## 2022-02-18 ENCOUNTER — Other Ambulatory Visit: Payer: Self-pay

## 2022-02-18 VITALS — BP 112/77 | HR 90 | Wt 194.2 lb

## 2022-02-18 DIAGNOSIS — M2550 Pain in unspecified joint: Secondary | ICD-10-CM | POA: Insufficient documentation

## 2022-02-18 DIAGNOSIS — K5900 Constipation, unspecified: Secondary | ICD-10-CM | POA: Insufficient documentation

## 2022-02-18 HISTORY — DX: Pain in unspecified joint: M25.50

## 2022-02-18 LAB — HIV-1 RNA QUANT-NO REFLEX-BLD
HIV 1 RNA Quant: NOT DETECTED Copies/mL
HIV-1 RNA Quant, Log: NOT DETECTED Log cps/mL

## 2022-02-18 NOTE — Assessment & Plan Note (Signed)
-  1/2-1 capful of Miralax daily as needed with goal of having 1 smooth BM every 1-2 days.

## 2022-02-18 NOTE — Patient Instructions (Addendum)
It was great to see you! Thank you for allowing me to participate in your care!  Our plans for today:  - Continue to take 1/2 to 1 capful of miralax daily as needed with a goal of having 1 smooth bowel movement every 1-2 days.  -Return as needed and continue to follow with neurology and infectious disease as needed   Take care and seek immediate care sooner if you develop any concerns.   Dr. Precious Gilding, DO [facility]

## 2022-03-04 ENCOUNTER — Encounter: Payer: Self-pay | Admitting: Physical Therapy

## 2022-03-04 ENCOUNTER — Ambulatory Visit: Payer: BC Managed Care – PPO | Attending: Sports Medicine | Admitting: Physical Therapy

## 2022-03-04 ENCOUNTER — Other Ambulatory Visit: Payer: Self-pay

## 2022-03-04 DIAGNOSIS — M6281 Muscle weakness (generalized): Secondary | ICD-10-CM | POA: Insufficient documentation

## 2022-03-04 DIAGNOSIS — M546 Pain in thoracic spine: Secondary | ICD-10-CM | POA: Insufficient documentation

## 2022-03-04 DIAGNOSIS — G35 Multiple sclerosis: Secondary | ICD-10-CM | POA: Insufficient documentation

## 2022-03-04 DIAGNOSIS — M25512 Pain in left shoulder: Secondary | ICD-10-CM | POA: Insufficient documentation

## 2022-03-04 DIAGNOSIS — G8929 Other chronic pain: Secondary | ICD-10-CM | POA: Diagnosis not present

## 2022-03-04 NOTE — Therapy (Signed)
OUTPATIENT PHYSICAL THERAPY THORACOLUMBAR EVALUATION   Patient Name: Candice [name] MRN: [id number] DOB:05/09/1986, 36 y.o., female Today's Date: 03/04/2022   PT End of Session - 03/04/22 1642     Visit Number 1    Number of Visits --   1-2x/week   Date for PT Re-Evaluation 04/29/22    Authorization Type [employer]    PT Start Time 1612    PT Stop Time 1645    PT Time Calculation (min) 33 min             Past Medical History:  Diagnosis Date   Complication of anesthesia    epidural with last pregnancy made entire left side numb, had to d/c   Depression    Headache    HSV 06/28/2007   Hx of migraines    Past Surgical History:  Procedure Laterality Date   BREAST SURGERY  2019   reduction   CESAREAN SECTION     CESAREAN SECTION  09/07/2011   Procedure: CESAREAN SECTION;  Surgeon: Florian Buff, MD;  Location: [facility];  Service: Gynecology;  Laterality: N/A;  Repeat cesarean section with delivery of baby boy at 16. Apgars 9/9.  Bilateral tubal ligation.   IUD REMOVAL  2005   in cervix   TUBAL LIGATION     tubal reanastomosis  08/13/2016   Patient Active Problem List   Diagnosis Date Noted   Constipation 02/18/2022   Arthralgia 02/18/2022   Depression, major, single episode, mild ([place]) 01/13/2022   Cyst of vulva 09/19/2021   Frequent urination 09/11/2021   UTI symptoms 07/30/2021   Medication monitoring encounter 01/11/2021   MS (multiple sclerosis) ([place]) 05/21/2020   GERD (gastroesophageal reflux disease) 05/21/2020   Right-sided abdominal pain of unknown etiology 04/06/2020   Abnormal MRI, spine 04/06/2020   Metatarsalgia of right foot 04/06/2020   Healthcare maintenance 02/10/2020   Right leg numbness 02/07/2020   HIV (human immunodeficiency virus infection) ([place]) 09/07/2019   Hemorrhagic cyst of left ovary 10/14/2016   Abdominal pain 04/16/2016   MRSA colonization 09/25/2014   Major depressive disorder, recurrent episode, moderate ([place]) 06/08/2013   Genital  herpes 02/16/2013   Back pain 10/13/2011   Vaginal discharge 10/10/2010   Cervical cancer screening 10/10/2010   OBESITY, NOS 08/20/2006    PCP: Precious Gilding, DO  REFERRING PROVIDER: Glennon Mac, DO  THERAPY DIAG:  Pain in thoracic spine - Plan: PT plan of care cert/re-cert  Muscle weakness - Plan: PT plan of care cert/re-cert  Left shoulder pain, unspecified chronicity - Plan: PT plan of care cert/re-cert  REFERRING DIAG: Chronic bilateral thoracic back pain [M54.6, G89.29], MS (multiple sclerosis) ([place]) [G35]  Rationale for Evaluation and Treatment Rehabilitation  SUBJECTIVE:  PERTINENT PAST HISTORY:  HIV, MS        PRECAUTIONS: Other: MS  WEIGHT BEARING RESTRICTIONS No  FALLS:  Has patient fallen in last 6 months? No, Number of falls: 0  MOI/History of condition:  Onset date: Early July 2023  Candice [name] is a 36 y.o. female who presents to clinic with chief complaint of L UT region and L shoulder pain with pain around mid L sided thoracic region.  She feels this is related to a possible MS flare which occurred in early July.  She also feels her bil wrist, bil ankle are getting weak. She feels her L leg is weaker than the R.   Red flags:  denies   Pain:  Are you having pain? Yes Pain location: L  UT region and L shoulder NPRS scale:  current 6/10  average 7/10  Aggravating factors: lifting, sleeping, working  NPRS, highest: 9/10 Relieving factors: rest  NPRS: best: 0/10 Pain description: intermittent, sharp, and aching Stage: Subacute Stability: getting better 24 hour pattern: worse with activity   Occupation: Clinical research associate (heavy lifting)  Assistive Device: NA  Hand Dominance: R  Patient Goals/Specific Activities: reduce pain, gain strength   OBJECTIVE:   DIAGNOSTIC FINDINGS:  MRI CX IMPRESSION: 1. Stable and normal MRI appearance of the cervical spinal cord with no evidence for demyelinating disease or abnormal  enhancement. 2. Mild cervical spondylosis at C5-6 with resultant mild to moderate bilateral C6 foraminal stenosis. 3. Additional mild spondylosis elsewhere within the cervical spine without significant stenosis or impingement.  MRI Tx Spine IMPRESSION: 1. Stable demyelinating lesion involving the right dorsolateral cord at T8-9. No associated enhancement to suggest active demyelination. No new lesions to suggest interval disease progression. 2. Otherwise normal MRI of the thoracic spine.  SENSATION:  Light touch: Appears intact, subjective report of N/T starting in July  MUSCLE LENGTH: Hamstrings: Right no restriction; Left no restriction ASLR: Right ASLR = PSLR; Left ASLR = PSLR     Thoracic AROM  AROM AROM  03/04/2022  Flexion WNL, w/ concordant pain  Extension WNL, w/ concordant pain  Right lateral flexion limited by 25%, w/ concordant pain  Left lateral flexion limited by 25%, w/ concordant pain  Right rotation limited by 25%, w/ concordant pain  Left rotation limited by 25%, w/ concordant pain    (Blank rows = not tested)    LUMBAR AROM  AROM AROM  03/04/2022  Flexion WNL  Extension WNL  Right lateral flexion WNL, w/ concordant pain  Left lateral flexion WNL, w/ concordant pain  Right rotation limited by 25%, w/ concordant pain  Left rotation limited by 25%, w/ concordant pain    (Blank rows = not tested  LE MMT:  MMT Right 03/04/2022 Left 03/04/2022  Hip flexion (L2, L3) 4 4  Knee extension (L3) 4+ 4+  Knee flexion 4+ 4+  Hip abduction 3+ 3+  Hip extension    Hip external rotation    Hip internal rotation    Hip adduction    Ankle dorsiflexion (L4)    Ankle plantarflexion (S1) Partial ROM Partial ROM  Ankle inversion    Ankle eversion    Great Toe ext (L5)    Grossly     (Blank rows = not tested, score listed is out of 5 possible points.  N = WNL, D = diminished, C = clear for gross weakness with myotome testing, * = concordant pain with  testing)   UPPER EXTREMITY MMT:  MMT Right 03/04/2022 Left 03/04/2022  Shoulder flexion 4+ 4*  Shoulder abduction (C5) 4+ 4*  Shoulder ER 4+ 4*  Shoulder IR 5 5  Middle trapezius 4 4  Lower trapezius    Shoulder extension    Grip strength R 20 kg L 21 kg  Cervical flexion (C1,C2)    Cervical S/B (C3)    Shoulder shrug (C4)    Elbow flexion (C6)    Elbow ext (C7)    Thumb ext (C8)    Finger abd (T1)    Grossly     (Blank rows = not tested, score listed is out of 5 possible points.  N = WNL, D = diminished, C = clear for gross weakness with myotome testing, * = concordant pain with testing)   Functional  Tests  Eval (03/04/2022)    Progressive balance screen (highest level completed for >/= 10''):  Feet together: 10'' Semi Tandem: R in rear 10'', L in rear 10'' Tandem: R in rear 10'', L in rear 10'' SLS: R 5'', L 9''     Supine single leg bridge: L5'', R 5'' partial ROM bil                                                        PALPATION:   TTP costovertebral joints mid and lower thoracic spine (L)   SPINAL SEGMENTAL MOBILITY ASSESSMENT:  See above  PATIENT SURVEYS:  FOTO 44 -> 58   TODAY'S TREATMENT  Creating, reviewing, and completing below HEP  PATIENT EDUCATION:  POC, diagnosis, prognosis, HEP, and outcome measures.  Pt educated via explanation, demonstration, and handout (HEP).  Pt confirms understanding verbally.   HOME EXERCISE PROGRAM: Access Code: [email or web address] URL: [email or web address] Date: 03/04/2022 Prepared by: Shearon Balo  Exercises - Supine Bridge  - 1 x daily - 7 x weekly - 3 sets - 10 reps - Shoulder External Rotation and Scapular Retraction with Resistance  - 1 x daily - 7 x weekly - 3 sets - 10 reps - Hooklying Isometric Clamshell  - 1 x daily - 7 x weekly - 3 sets - 10 reps  ASTERISK SIGNS   Asterisk Signs Eval (03/04/2022)                                                 ASSESSMENT:  CLINICAL  IMPRESSION: Candice [name] is a 36 y.o. female who presents to clinic with signs and sxs consistent with L shoulder and L UT pain;   Consistent with SAPS and L UT tightness.  L mid thoracic pain with radiating pain to L anterior rib cage; consistent with costovertebral joint dysfunction.  Bil LE/hip/core weakness secondary to MS.  OBJECTIVE IMPAIRMENTS: Pain, thoracic ROM, LE strength, UE strength, balance  ACTIVITY LIMITATIONS: walking for long periods, lifting, work, reaching Candice [name]: See medical history and pertinent history   REHAB POTENTIAL: Good  CLINICAL DECISION MAKING: Stable/uncomplicated  EVALUATION COMPLEXITY: Low   GOALS:   SHORT TERM GOALS: Target date: 03/25/2022  Candice [name] will be >75% HEP compliant to improve carryover between sessions and facilitate independent management of condition  Evaluation (03/04/2022): ongoing Goal status: INITIAL   LONG TERM GOALS: Target date: 04/29/2022  Candice [name] will improve FOTO score to 58 as a proxy for functional improvement  Evaluation/Baseline (03/04/2022): 48 Goal status: INITIAL   2.  Candice [name] will be able to stand for >30'' in bil SLS stance, to show a significant improvement in balance in order to reduce fall risk   Evaluation/Baseline (03/04/2022): <10'' bil Goal status: INITIAL   3.  Candice [name] will improve the following MMTs to >/= 4/5 to show improvement in strength   Evaluation/Baseline (03/04/2022):   LE MMT:  MMT Right 03/04/2022 Left 03/04/2022  Hip flexion (L2, L3) 4 4  Knee extension (L3) 4+ 4+  Knee flexion 4+ 4+  Hip abduction 3+ 3+  Hip extension    Hip external rotation    Hip internal rotation    Hip adduction  Ankle dorsiflexion (L4)    Ankle plantarflexion (S1) Partial ROM Partial ROM  Ankle inversion    Ankle eversion    Great Toe ext (L5)    Grossly     (Blank rows = not tested, score listed is out of 5 possible points.  N = WNL, D = diminished, C = clear for gross weakness with  myotome testing, * = concordant pain with testing)  Goal status: INITIAL   4.  Candice [name] will self report >/= 50% decrease in pain from evaluation   Evaluation/Baseline (03/04/2022): 9/10 max pain Goal status: INITIAL   5.  Candice [name] will be able to perform supine single leg bridge as evidence of improved hip extension and core strength  Evaluation/Baseline (03/04/2022): unable full ROM bil Goal status: INITIAL     PLAN: PT FREQUENCY: 1-2x/week  PT DURATION: 8 weeks (Ending 04/29/2022)  PLANNED INTERVENTIONS: Therapeutic exercises, Aquatic therapy, Therapeutic activity, Neuro Muscular re-education, Gait training, Patient/Family education, Joint mobilization, Dry Needling, Electrical stimulation, Spinal mobilization and/or manipulation, Moist heat, Taping, Vasopneumatic device, Ionotophoresis '4mg'$ /ml Dexamethasone, and Manual therapy  PLAN FOR NEXT SESSION: progressive hip and core strengthening, balance, shoulder girdle and R/C strengthening, manual therapy PRN   Shearon Balo PT, DPT 03/04/2022, 5:26 PM

## 2022-03-06 NOTE — Progress Notes (Unsigned)
Candice [name] D.[name] [address] [place] [address] Phone: [phone]   Assessment and Plan:     There are no diagnoses linked to this encounter.  *** - Patient has received significant relief with OMT in the past.  Elects for repeat OMT today.  Tolerated well per note below. - Decision today to treat with OMT was based on Physical Exam   After verbal consent patient was treated with HVLA (high velocity low amplitude), ME (muscle energy), FPR (flex positional release), ST (soft tissue), PC/PD (Pelvic Compression/ Pelvic Decompression) techniques in cervical, rib, thoracic, lumbar, and pelvic areas. Patient tolerated the procedure well with improvement in symptoms.  Patient educated on potential side effects of soreness and recommended to rest, hydrate, and use Tylenol as needed for pain control.   Pertinent previous records reviewed include ***   Follow Up: ***     Subjective:   I, Candice [name], am serving as a Neurosurgeon for Doctor Richardean Sale   Chief Complaint: thoracic back pain    HPI:    02/17/2022 Patient is a 36 year old female complaining of thoracic back pain. Patient states  Beginning around 2018, she began having intermittent episodes of numbness, weakness and dizziness.  In May or June of that year, she began having a recurrence of symptoms, including right flank pain and numbness radiating down her right leg with some associated right leg weakness.  She felt off balance.  She saw neurology at that time.  MRI of thoracic spine revealed subtle enhancing hyperintense focus at the T8-T9 region.would like to discuss OMT   03/10/2022 Patient states   Relevant Historical Information: Multiple sclerosis  Additional pertinent review of systems negative.  Current Outpatient Medications  Medication Sig Dispense Refill   cabotegravir & rilpivirine ER (CABENUVA) 600 & 900 MG/3ML injection Inject 1 kit into the muscle every 30 (thirty)  days. 6 mL 1   cyclobenzaprine (FLEXERIL) 10 MG tablet TAKE 1 TABLET BY MOUTH THREE TIMES A DAY AS NEEDED FOR MUSCLE SPASMS 30 tablet 2   hydrOXYzine (ATARAX) 25 MG tablet Take 1-2 tablets (25-50 mg total) by mouth 3 (three) times daily as needed. 30 tablet 0   naproxen sodium (ALEVE) 220 MG tablet Take 220 mg by mouth daily as needed (For pain).     ondansetron (ZOFRAN) 4 MG tablet Take 1 tablet (4 mg total) by mouth every 8 (eight) hours as needed for nausea or vomiting. 20 tablet 0   oxyCODONE-acetaminophen (PERCOCET/ROXICET) 5-325 MG tablet Take 1 tablet by mouth every 8 (eight) hours as needed for up to 12 doses for severe pain. 12 tablet 0   psyllium (METAMUCIL) 58.6 % packet Take 1 packet by mouth daily.     senna-docusate (SENOKOT-S) 8.6-50 MG tablet Take 1 tablet by mouth daily. 14 tablet 0   Teriflunomide 14 MG TABS Take 14 mg by mouth daily. 30 tablet 11   valACYclovir (VALTREX) 1000 MG tablet Take 1 tablet (1,000 mg total) by mouth 2 (two) times daily. 10 tablet 2   No current facility-administered medications for this visit.      Objective:     There were no vitals filed for this visit.    There is no height or weight on file to calculate BMI.    Physical Exam:     General: Well-appearing, cooperative, sitting comfortably in no acute distress.   OMT Physical Exam:  ASIS Compression Test: Positive Right Cervical: TTP paraspinal, *** Rib: Bilateral  elevated first rib with TTP Thoracic: TTP paraspinal,*** Lumbar: TTP paraspinal,*** Pelvis: Right anterior innominate  Electronically signed by:  Benito Mccreedy D.[name] 9:57 AM 03/06/22

## 2022-03-10 ENCOUNTER — Ambulatory Visit: Payer: BC Managed Care – PPO | Admitting: Physical Therapy

## 2022-03-10 ENCOUNTER — Ambulatory Visit (INDEPENDENT_AMBULATORY_CARE_PROVIDER_SITE_OTHER): Payer: BC Managed Care – PPO | Admitting: Sports Medicine

## 2022-03-10 VITALS — BP 120/80 | HR 85 | Ht 65.0 in | Wt 195.0 lb

## 2022-03-10 DIAGNOSIS — M9903 Segmental and somatic dysfunction of lumbar region: Secondary | ICD-10-CM

## 2022-03-10 DIAGNOSIS — M9901 Segmental and somatic dysfunction of cervical region: Secondary | ICD-10-CM

## 2022-03-10 DIAGNOSIS — G8929 Other chronic pain: Secondary | ICD-10-CM | POA: Diagnosis not present

## 2022-03-10 DIAGNOSIS — M9905 Segmental and somatic dysfunction of pelvic region: Secondary | ICD-10-CM

## 2022-03-10 DIAGNOSIS — M9902 Segmental and somatic dysfunction of thoracic region: Secondary | ICD-10-CM | POA: Diagnosis not present

## 2022-03-10 DIAGNOSIS — G35D Multiple sclerosis, unspecified: Secondary | ICD-10-CM

## 2022-03-10 DIAGNOSIS — G35 Multiple sclerosis: Secondary | ICD-10-CM

## 2022-03-10 DIAGNOSIS — M9908 Segmental and somatic dysfunction of rib cage: Secondary | ICD-10-CM

## 2022-03-10 DIAGNOSIS — M546 Pain in thoracic spine: Secondary | ICD-10-CM | POA: Diagnosis not present

## 2022-03-10 NOTE — Patient Instructions (Signed)
Good to see you   

## 2022-03-12 ENCOUNTER — Ambulatory Visit: Payer: BC Managed Care – PPO | Admitting: Physical Therapy

## 2022-03-19 ENCOUNTER — Ambulatory Visit: Payer: BC Managed Care – PPO | Admitting: Physical Therapy

## 2022-03-19 ENCOUNTER — Encounter: Payer: Self-pay | Admitting: Physical Therapy

## 2022-03-19 DIAGNOSIS — M546 Pain in thoracic spine: Secondary | ICD-10-CM | POA: Diagnosis not present

## 2022-03-19 DIAGNOSIS — M6281 Muscle weakness (generalized): Secondary | ICD-10-CM

## 2022-03-19 DIAGNOSIS — M25512 Pain in left shoulder: Secondary | ICD-10-CM

## 2022-03-19 NOTE — Therapy (Signed)
OUTPATIENT PHYSICAL THERAPY TREATMENT NOTE   Patient Name: Candice [name] MRN: [id number] DOB:05/16/1986, 36 y.o., female Today's Date: 03/19/2022  PCP: Precious Gilding, DO   REFERRING PROVIDER: Glennon Mac, DO   PT End of Session - 03/19/22 1746     Visit Number 2    Number of Visits --   1-2x/week   Date for PT Re-Evaluation 04/29/22    Authorization Type BCBS    PT Start Time 0545    PT Stop Time 0627    PT Time Calculation (min) 42 min             Past Medical History:  Diagnosis Date   Complication of anesthesia    epidural with last pregnancy made entire left side numb, had to d/c   Depression    Headache    HSV 06/28/2007   Hx of migraines    Past Surgical History:  Procedure Laterality Date   BREAST SURGERY  2019   reduction   CESAREAN SECTION     CESAREAN SECTION  09/07/2011   Procedure: CESAREAN SECTION;  Surgeon: Florian Buff, MD;  Location: [facility];  Service: Gynecology;  Laterality: N/A;  Repeat cesarean section with delivery of baby boy at 33. Apgars 9/9.  Bilateral tubal ligation.   IUD REMOVAL  2005   in cervix   TUBAL LIGATION     tubal reanastomosis  08/13/2016   Patient Active Problem List   Diagnosis Date Noted   Constipation 02/18/2022   Arthralgia 02/18/2022   Depression, major, single episode, mild ([place]) 01/13/2022   Cyst of vulva 09/19/2021   Frequent urination 09/11/2021   UTI symptoms 07/30/2021   Medication monitoring encounter 01/11/2021   MS (multiple sclerosis) ([place]) 05/21/2020   GERD (gastroesophageal reflux disease) 05/21/2020   Right-sided abdominal pain of unknown etiology 04/06/2020   Abnormal MRI, spine 04/06/2020   Metatarsalgia of right foot 04/06/2020   Healthcare maintenance 02/10/2020   Right leg numbness 02/07/2020   HIV (human immunodeficiency virus infection) ([place]) 09/07/2019   Hemorrhagic cyst of left ovary 10/14/2016   Abdominal pain 04/16/2016   MRSA colonization 09/25/2014   Major depressive  disorder, recurrent episode, moderate ([place]) 06/08/2013   Genital herpes 02/16/2013   Back pain 10/13/2011   Vaginal discharge 10/10/2010   Cervical cancer screening 10/10/2010   OBESITY, NOS 08/20/2006    THERAPY DIAG:  Pain in thoracic spine  Muscle weakness  Left shoulder pain, unspecified chronicity  REFERRING DIAG: Chronic bilateral thoracic back pain [M54.6, G89.29], MS (multiple sclerosis) (HCC) [G35]  PERTINENT HISTORY: HIV, MS    PRECAUTIONS/RESTRICTIONS:   MS  SUBJECTIVE:  Pt reports that the stretches for her shoulder have been helpful.  She feels that the bridge was not very helpful for her low back.  Pain:  Are you having pain? Yes Pain location: L UT region and L shoulder, low back NPRS scale:  current 7/10  Aggravating factors: lifting, sleeping, working Relieving factors: rest Pain description: intermittent, sharp, and aching Stage: Subacute Stability: getting better  OBJECTIVE: (objective measures completed at initial evaluation unless otherwise dated)  DIAGNOSTIC FINDINGS:  MRI CX IMPRESSION: 1. Stable and normal MRI appearance of the cervical spinal cord with no evidence for demyelinating disease or abnormal enhancement. 2. Mild cervical spondylosis at C5-6 with resultant mild to moderate bilateral C6 foraminal stenosis. 3. Additional mild spondylosis elsewhere within the cervical spine without significant stenosis or impingement.   MRI Tx Spine IMPRESSION: 1. Stable demyelinating lesion involving the right dorsolateral  cord at T8-9. No associated enhancement to suggest active demyelination. No new lesions to suggest interval disease progression. 2. Otherwise normal MRI of the thoracic spine.   SENSATION:          Light touch: Appears intact, subjective report of N/T starting in July   MUSCLE LENGTH: Hamstrings: Right no restriction; Left no restriction ASLR: Right ASLR = PSLR; Left ASLR = PSLR         Thoracic AROM   AROM AROM   03/04/2022  Flexion WNL, w/ concordant pain  Extension WNL, w/ concordant pain  Right lateral flexion limited by 25%, w/ concordant pain  Left lateral flexion limited by 25%, w/ concordant pain  Right rotation limited by 25%, w/ concordant pain  Left rotation limited by 25%, w/ concordant pain    (Blank rows = not tested)      LUMBAR AROM   AROM AROM  03/04/2022  Flexion WNL  Extension WNL  Right lateral flexion WNL, w/ concordant pain  Left lateral flexion WNL, w/ concordant pain  Right rotation limited by 25%, w/ concordant pain  Left rotation limited by 25%, w/ concordant pain    (Blank rows = not tested   LE MMT:   MMT Right 03/04/2022 Left 03/04/2022  Hip flexion (L2, L3) 4 4  Knee extension (L3) 4+ 4+  Knee flexion 4+ 4+  Hip abduction 3+ 3+  Hip extension      Hip external rotation      Hip internal rotation      Hip adduction      Ankle dorsiflexion (L4)      Ankle plantarflexion (S1) Partial ROM Partial ROM  Ankle inversion      Ankle eversion      Great Toe ext (L5)      Grossly        (Blank rows = not tested, score listed is out of 5 possible points.  N = WNL, D = diminished, C = clear for gross weakness with myotome testing, * = concordant pain with testing)     UPPER EXTREMITY MMT:   MMT Right 03/04/2022 Left 03/04/2022  Shoulder flexion 4+ 4*  Shoulder abduction (C5) 4+ 4*  Shoulder ER 4+ 4*  Shoulder IR 5 5  Middle trapezius 4 4  Lower trapezius      Shoulder extension      Grip strength R 20 kg L 21 kg  Cervical flexion (C1,C2)      Cervical S/B (C3)      Shoulder shrug (C4)      Elbow flexion (C6)      Elbow ext (C7)      Thumb ext (C8)      Finger abd (T1)      Grossly        (Blank rows = not tested, score listed is out of 5 possible points.  N = WNL, D = diminished, C = clear for gross weakness with myotome testing, * = concordant pain with testing)     Functional Tests   Eval (03/04/2022)      Progressive balance screen  (highest level completed for >/= 10''):   Feet together: 10'' Semi Tandem: R in rear 10'', L in rear 10'' Tandem: R in rear 10'', L in rear 10'' SLS: R 5'', L 9''       Supine single leg bridge: L5'', R 5'' partial ROM bil  PALPATION:            TTP costovertebral joints mid and lower thoracic spine (L)    SPINAL SEGMENTAL MOBILITY ASSESSMENT:  See above   PATIENT SURVEYS:  FOTO 44 -> 58     TODAY'S TREATMENT  Creating, reviewing, and completing below HEP   PATIENT EDUCATION:  POC, diagnosis, prognosis, HEP, and outcome measures.  Pt educated via explanation, demonstration, and handout (HEP).  Pt confirms understanding verbally.    HOME EXERCISE PROGRAM: Access Code: [email or web address] URL: [email or web address] Date: 03/04/2022 Prepared by: Shearon Balo   Exercises - Supine Bridge  - 1 x daily - 7 x weekly - 3 sets - 10 reps - Shoulder External Rotation and Scapular Retraction with Resistance  - 1 x daily - 7 x weekly - 3 sets - 10 reps - Hooklying Isometric Clamshell  - 1 x daily - 7 x weekly - 3 sets - 10 reps   ASTERISK SIGNS     Asterisk Signs Eval (03/04/2022)             SLS <10''             SL bridge L 5'', R 5'' (bil partial ROM)             hip abduction 3+ bil                                                TREATMENT 9/27:  Therapeutic Exercise: - nu-step L5 41mwhile taking subjective and planning session with patient - thoracic rotation - row - Green TB - 2x20 - shoulder ext - red TB - 2x20 - horizontal abd with GTB in supine- 2x10 - S/L ER (L) - 2x15 - 2# - S/L abd - 2x10 - pain free arc - LTR - 20x - PPT x20  With resistance  Manual Therapy - STM bil lumbar paraspinals - UPA and CPA lumbar spine G III - AP GH mobs G III   ASSESSMENT:   CLINICAL IMPRESSION: Timesha tolerated session well with no adverse reaction.  Her  L shoulder seems to be improving as expected.  She had higher levels of low back pain today.  We concentrated on PPT to good effect.  Will continue to progress L shoulder and core as able.  Pt with significant pain reduction following manual.   OBJECTIVE IMPAIRMENTS: Pain, thoracic ROM, LE strength, UE strength, balance   ACTIVITY LIMITATIONS: walking for long periods, lifting, work, reaching [place] See medical history and pertinent history     REHAB POTENTIAL: Good   CLINICAL DECISION MAKING: Stable/uncomplicated   EVALUATION COMPLEXITY: Low     GOALS:     SHORT TERM GOALS: Target date: 03/25/2022   SChrishellewill be >75% HEP compliant to improve carryover between sessions and facilitate independent management of condition   Evaluation (03/04/2022): ongoing Goal status: INITIAL     LONG TERM GOALS: Target date: 04/29/2022   SMalorywill improve FOTO score to 58 as a proxy for functional improvement   Evaluation/Baseline (03/04/2022): 48 Goal status: INITIAL     2.  Gwendlyon will be able to stand for >30'' in bil SLS stance, to show a significant improvement in balance in order to reduce fall risk    Evaluation/Baseline (03/04/2022): <10'' bil Goal status: INITIAL     3.  Clairessa  will improve the following MMTs to >/= 4/5 to show improvement in strength    Evaluation/Baseline (03/04/2022):    LE MMT:   MMT Right 03/04/2022 Left 03/04/2022  Hip flexion (L2, L3) 4 4  Knee extension (L3) 4+ 4+  Knee flexion 4+ 4+  Hip abduction 3+ 3+  Hip extension      Hip external rotation      Hip internal rotation      Hip adduction      Ankle dorsiflexion (L4)      Ankle plantarflexion (S1) Partial ROM Partial ROM  Ankle inversion      Ankle eversion      Great Toe ext (L5)      Grossly        (Blank rows = not tested, score listed is out of 5 possible points.  N = WNL, D = diminished, C = clear for gross weakness with myotome testing, * = concordant pain  with testing)   Goal status: INITIAL     4.  Melissa will self report >/= 50% decrease in pain from evaluation    Evaluation/Baseline (03/04/2022): 9/10 max pain Goal status: INITIAL     5.  Armeda will be able to perform supine single leg bridge as evidence of improved hip extension and core strength   Evaluation/Baseline (03/04/2022): unable full ROM bil Goal status: INITIAL         PLAN: PT FREQUENCY: 1-2x/week   PT DURATION: 8 weeks (Ending 04/29/2022)   PLANNED INTERVENTIONS: Therapeutic exercises, Aquatic therapy, Therapeutic activity, Neuro Muscular re-education, Gait training, Patient/Family education, Joint mobilization, Dry Needling, Electrical stimulation, Spinal mobilization and/or manipulation, Moist heat, Taping, Vasopneumatic device, Ionotophoresis '4mg'$ /ml Dexamethasone, and Manual therapy   PLAN FOR NEXT SESSION: progressive hip and core strengthening, balance, shoulder girdle and R/C strengthening, manual therapy PRN    Kevan Ny Valecia Beske PT 03/19/2022, 5:46 PM

## 2022-03-20 ENCOUNTER — Ambulatory Visit: Payer: BC Managed Care – PPO

## 2022-03-20 DIAGNOSIS — M25512 Pain in left shoulder: Secondary | ICD-10-CM

## 2022-03-20 DIAGNOSIS — M6281 Muscle weakness (generalized): Secondary | ICD-10-CM

## 2022-03-20 DIAGNOSIS — M546 Pain in thoracic spine: Secondary | ICD-10-CM | POA: Diagnosis not present

## 2022-03-20 NOTE — Therapy (Signed)
OUTPATIENT PHYSICAL THERAPY TREATMENT NOTE   Patient Name: Candice [name] MRN: [id number] DOB:1986-05-25, 36 y.o., female Today's Date: 03/20/2022  PCP: Precious Gilding, DO   REFERRING PROVIDER: Glennon Mac, DO   PT End of Session - 03/20/22 1748     Visit Number 3    Date for PT Re-Evaluation 04/29/22    Authorization Type BCBS    PT Start Time 1747    PT Stop Time 1826    PT Time Calculation (min) 39 min    Activity Tolerance Patient tolerated treatment well    Behavior During Therapy [facility] for tasks assessed/performed              Past Medical History:  Diagnosis Date   Complication of anesthesia    epidural with last pregnancy made entire left side numb, had to d/c   Depression    Headache    HSV 06/28/2007   Hx of migraines    Past Surgical History:  Procedure Laterality Date   BREAST SURGERY  2019   reduction   CESAREAN SECTION     CESAREAN SECTION  09/07/2011   Procedure: CESAREAN SECTION;  Surgeon: Florian Buff, MD;  Location: [facility];  Service: Gynecology;  Laterality: N/A;  Repeat cesarean section with delivery of baby boy at 70. Apgars 9/9.  Bilateral tubal ligation.   IUD REMOVAL  2005   in cervix   TUBAL LIGATION     tubal reanastomosis  08/13/2016   Patient Active Problem List   Diagnosis Date Noted   Constipation 02/18/2022   Arthralgia 02/18/2022   Depression, major, single episode, mild ([place]) 01/13/2022   Cyst of vulva 09/19/2021   Frequent urination 09/11/2021   UTI symptoms 07/30/2021   Medication monitoring encounter 01/11/2021   MS (multiple sclerosis) ([place]) 05/21/2020   GERD (gastroesophageal reflux disease) 05/21/2020   Right-sided abdominal pain of unknown etiology 04/06/2020   Abnormal MRI, spine 04/06/2020   Metatarsalgia of right foot 04/06/2020   Healthcare maintenance 02/10/2020   Right leg numbness 02/07/2020   HIV (human immunodeficiency virus infection) ([place]) 09/07/2019   Hemorrhagic cyst of left ovary 10/14/2016    Abdominal pain 04/16/2016   MRSA colonization 09/25/2014   Major depressive disorder, recurrent episode, moderate ([place]) 06/08/2013   Genital herpes 02/16/2013   Back pain 10/13/2011   Vaginal discharge 10/10/2010   Cervical cancer screening 10/10/2010   OBESITY, NOS 08/20/2006    THERAPY DIAG:  Pain in thoracic spine  Muscle weakness  Left shoulder pain, unspecified chronicity  REFERRING DIAG: Chronic bilateral thoracic back pain [M54.6, G89.29], MS (multiple sclerosis) (HCC) [G35]  PERTINENT HISTORY: HIV, MS    PRECAUTIONS/RESTRICTIONS:   MS  SUBJECTIVE: Patient reports that her back pain has been higher the past couple days, she isn't sure if she's going into another MS flare up or not.  Pain:  Are you having pain? Yes Pain location: L UT region and L shoulder, low back NPRS scale:  current 8/10  Aggravating factors: lifting, sleeping, working Relieving factors: rest Pain description: intermittent, sharp, and aching Stage: Subacute Stability: getting better  OBJECTIVE: (objective measures completed at initial evaluation unless otherwise dated)  DIAGNOSTIC FINDINGS:  MRI CX IMPRESSION: 1. Stable and normal MRI appearance of the cervical spinal cord with no evidence for demyelinating disease or abnormal enhancement. 2. Mild cervical spondylosis at C5-6 with resultant mild to moderate bilateral C6 foraminal stenosis. 3. Additional mild spondylosis elsewhere within the cervical spine without significant stenosis or impingement.   MRI  Tx Spine IMPRESSION: 1. Stable demyelinating lesion involving the right dorsolateral cord at T8-9. No associated enhancement to suggest active demyelination. No new lesions to suggest interval disease progression. 2. Otherwise normal MRI of the thoracic spine.   SENSATION:          Light touch: Appears intact, subjective report of N/T starting in July   MUSCLE LENGTH: Hamstrings: Right no restriction; Left no  restriction ASLR: Right ASLR = PSLR; Left ASLR = PSLR         Thoracic AROM   AROM AROM  03/04/2022  Flexion WNL, w/ concordant pain  Extension WNL, w/ concordant pain  Right lateral flexion limited by 25%, w/ concordant pain  Left lateral flexion limited by 25%, w/ concordant pain  Right rotation limited by 25%, w/ concordant pain  Left rotation limited by 25%, w/ concordant pain    (Blank rows = not tested)      LUMBAR AROM   AROM AROM  03/04/2022  Flexion WNL  Extension WNL  Right lateral flexion WNL, w/ concordant pain  Left lateral flexion WNL, w/ concordant pain  Right rotation limited by 25%, w/ concordant pain  Left rotation limited by 25%, w/ concordant pain    (Blank rows = not tested   LE MMT:   MMT Right 03/04/2022 Left 03/04/2022  Hip flexion (L2, L3) 4 4  Knee extension (L3) 4+ 4+  Knee flexion 4+ 4+  Hip abduction 3+ 3+  Hip extension      Hip external rotation      Hip internal rotation      Hip adduction      Ankle dorsiflexion (L4)      Ankle plantarflexion (S1) Partial ROM Partial ROM  Ankle inversion      Ankle eversion      Great Toe ext (L5)      Grossly        (Blank rows = not tested, score listed is out of 5 possible points.  N = WNL, D = diminished, C = clear for gross weakness with myotome testing, * = concordant pain with testing)     UPPER EXTREMITY MMT:   MMT Right 03/04/2022 Left 03/04/2022  Shoulder flexion 4+ 4*  Shoulder abduction (C5) 4+ 4*  Shoulder ER 4+ 4*  Shoulder IR 5 5  Middle trapezius 4 4  Lower trapezius      Shoulder extension      Grip strength R 20 kg L 21 kg  Cervical flexion (C1,C2)      Cervical S/B (C3)      Shoulder shrug (C4)      Elbow flexion (C6)      Elbow ext (C7)      Thumb ext (C8)      Finger abd (T1)      Grossly        (Blank rows = not tested, score listed is out of 5 possible points.  N = WNL, D = diminished, C = clear for gross weakness with myotome testing, * = concordant pain  with testing)     Functional Tests   Eval (03/04/2022)      Progressive balance screen (highest level completed for >/= 10''):   Feet together: 10'' Semi Tandem: R in rear 10'', L in rear 10'' Tandem: R in rear 10'', L in rear 10'' SLS: R 5'', L 9''       Supine single leg bridge: L5'', R 5'' partial ROM bil  PALPATION:            TTP costovertebral joints mid and lower thoracic spine (L)    SPINAL SEGMENTAL MOBILITY ASSESSMENT:  See above   PATIENT SURVEYS:  FOTO 44 -> 58     TODAY'S TREATMENT  Creating, reviewing, and completing below HEP   PATIENT EDUCATION:  POC, diagnosis, prognosis, HEP, and outcome measures.  Pt educated via explanation, demonstration, and handout (HEP).  Pt confirms understanding verbally.    HOME EXERCISE PROGRAM: Access Code: [email or web address] URL: [email or web address] Date: 03/04/2022 Prepared by: Shearon Balo   Exercises - Supine Bridge  - 1 x daily - 7 x weekly - 3 sets - 10 reps - Shoulder External Rotation and Scapular Retraction with Resistance  - 1 x daily - 7 x weekly - 3 sets - 10 reps - Hooklying Isometric Clamshell  - 1 x daily - 7 x weekly - 3 sets - 10 reps   ASTERISK SIGNS     Asterisk Signs Eval (03/04/2022)             SLS <10''             SL bridge L 5'', R 5'' (bil partial ROM)             hip abduction 3+ bil                                            TREATMENT 9/28:  - nu-step L5 30mwhile taking subjective and planning session with patient - row - Green TB - 2x20 - shoulder ext - red TB - 2x20 - shoulder flexion/extension with dowel - inhale up/exhale down - horizontal abd with GTB in supine- 2x10 - inhale abd/exhale add - S/L ER (L) - 2x15 - 2# - S/L abd - 2x10 - pain free arc 2# - LTR - 20x - PPT x20   TREATMENT 9/27:  Therapeutic Exercise: - nu-step L5 530mhile taking subjective and  planning session with patient - thoracic rotation - row - Green TB - 2x20 - shoulder ext - red TB - 2x20 - horizontal abd with GTB in supine- 2x10 - S/L ER (L) - 2x15 - 2# - S/L abd - 2x10 - pain free arc - LTR - 20x - PPT x20  With resistance  Manual Therapy - STM bil lumbar paraspinals - UPA and CPA lumbar spine G III - AP GH mobs G III   ASSESSMENT:   CLINICAL IMPRESSION: Patient presents to PT with increased pain in her mid thoracic and lower back, R sided. She reports HEP compliance and that she feels like therapy has been helpful so far. Session today slightly regressed due to back to back appointments, increased pain, and patient worried she might be heading into an MS flare. Patient was able to tolerate all prescribed exercises with no adverse effects. Patient continues to benefit from skilled PT services and should be progressed as able to improve functional independence.    OBJECTIVE IMPAIRMENTS: Pain, thoracic ROM, LE strength, UE strength, balance   ACTIVITY LIMITATIONS: walking for long periods, lifting, work, reaching [place]st HillsSee medical history and pertinent history     REHAB POTENTIAL: Good   CLINICAL DECISION MAKING: Stable/uncomplicated   EVALUATION COMPLEXITY: Low     GOALS:     SHORT TERM GOALS: Target date: 03/25/2022   ShVickeill  be >75% HEP compliant to improve carryover between sessions and facilitate independent management of condition   Evaluation (03/04/2022): ongoing Goal status: INITIAL     LONG TERM GOALS: Target date: 04/29/2022   Anahlia will improve FOTO score to 58 as a proxy for functional improvement   Evaluation/Baseline (03/04/2022): 48 Goal status: INITIAL     2.  Ayala will be able to stand for >30'' in bil SLS stance, to show a significant improvement in balance in order to reduce fall risk    Evaluation/Baseline (03/04/2022): <10'' bil Goal status: INITIAL     3.  Dhani will improve the  following MMTs to >/= 4/5 to show improvement in strength    Evaluation/Baseline (03/04/2022):    LE MMT:   MMT Right 03/04/2022 Left 03/04/2022  Hip flexion (L2, L3) 4 4  Knee extension (L3) 4+ 4+  Knee flexion 4+ 4+  Hip abduction 3+ 3+  Hip extension      Hip external rotation      Hip internal rotation      Hip adduction      Ankle dorsiflexion (L4)      Ankle plantarflexion (S1) Partial ROM Partial ROM  Ankle inversion      Ankle eversion      Great Toe ext (L5)      Grossly        (Blank rows = not tested, score listed is out of 5 possible points.  N = WNL, D = diminished, C = clear for gross weakness with myotome testing, * = concordant pain with testing)   Goal status: INITIAL     4.  Shannen will self report >/= 50% decrease in pain from evaluation    Evaluation/Baseline (03/04/2022): 9/10 max pain Goal status: INITIAL     5.  Londen will be able to perform supine single leg bridge as evidence of improved hip extension and core strength   Evaluation/Baseline (03/04/2022): unable full ROM bil Goal status: INITIAL         PLAN: PT FREQUENCY: 1-2x/week   PT DURATION: 8 weeks (Ending 04/29/2022)   PLANNED INTERVENTIONS: Therapeutic exercises, Aquatic therapy, Therapeutic activity, Neuro Muscular re-education, Gait training, Patient/Family education, Joint mobilization, Dry Needling, Electrical stimulation, Spinal mobilization and/or manipulation, Moist heat, Taping, Vasopneumatic device, Ionotophoresis '4mg'$ /ml Dexamethasone, and Manual therapy   PLAN FOR NEXT SESSION: progressive hip and core strengthening, balance, shoulder girdle and R/C strengthening, manual therapy PRN    Margarette Canada PTA 03/20/2022, 6:27 PM

## 2022-03-24 ENCOUNTER — Ambulatory Visit: Payer: BC Managed Care – PPO | Admitting: Physical Therapy

## 2022-03-26 ENCOUNTER — Ambulatory Visit: Payer: BC Managed Care – PPO | Attending: Sports Medicine

## 2022-03-26 DIAGNOSIS — M6281 Muscle weakness (generalized): Secondary | ICD-10-CM | POA: Diagnosis not present

## 2022-03-26 DIAGNOSIS — M546 Pain in thoracic spine: Secondary | ICD-10-CM | POA: Diagnosis not present

## 2022-03-26 DIAGNOSIS — M25512 Pain in left shoulder: Secondary | ICD-10-CM | POA: Diagnosis not present

## 2022-03-26 NOTE — Therapy (Signed)
OUTPATIENT PHYSICAL THERAPY TREATMENT NOTE   Patient Name: Candice [name] MRN: [id number] DOB:07-Mar-1986, 36 y.o., female Today's Date: 03/26/2022  PCP: Precious Gilding, DO   REFERRING PROVIDER: Glennon Mac, DO   PT End of Session - 03/26/22 1610     Visit Number 4    Date for PT Re-Evaluation 04/29/22    Authorization Type BC[employer]    PT Start Time 1615    PT Stop Time 1655    PT Time Calculation (min) 40 min    Activity Tolerance Patient tolerated treatment well    Behavior During Therapy [facility] for tasks assessed/performed               Past Medical History:  Diagnosis Date   Complication of anesthesia    epidural with last pregnancy made entire left side numb, had to d/c   Depression    Headache    HSV 06/28/2007   Hx of migraines    Past Surgical History:  Procedure Laterality Date   BREAST SURGERY  2019   reduction   CESAREAN SECTION     CESAREAN SECTION  09/07/2011   Procedure: CESAREAN SECTION;  Surgeon: Florian Buff, MD;  Location: [facility];  Service: Gynecology;  Laterality: N/A;  Repeat cesarean section with delivery of baby boy at 46. Apgars 9/9.  Bilateral tubal ligation.   IUD REMOVAL  2005   in cervix   TUBAL LIGATION     tubal reanastomosis  08/13/2016   Patient Active Problem List   Diagnosis Date Noted   Constipation 02/18/2022   Arthralgia 02/18/2022   Depression, major, single episode, mild ([place]) 01/13/2022   Cyst of vulva 09/19/2021   Frequent urination 09/11/2021   UTI symptoms 07/30/2021   Medication monitoring encounter 01/11/2021   MS (multiple sclerosis) ([place]) 05/21/2020   GERD (gastroesophageal reflux disease) 05/21/2020   Right-sided abdominal pain of unknown etiology 04/06/2020   Abnormal MRI, spine 04/06/2020   Metatarsalgia of right foot 04/06/2020   Healthcare maintenance 02/10/2020   Right leg numbness 02/07/2020   HIV (human immunodeficiency virus infection) ([place]) 09/07/2019   Hemorrhagic cyst of left ovary 10/14/2016    Abdominal pain 04/16/2016   MRSA colonization 09/25/2014   Major depressive disorder, recurrent episode, moderate ([place]) 06/08/2013   Genital herpes 02/16/2013   Back pain 10/13/2011   Vaginal discharge 10/10/2010   Cervical cancer screening 10/10/2010   OBESITY, NOS 08/20/2006    THERAPY DIAG:  Pain in thoracic spine  Muscle weakness  Left shoulder pain, unspecified chronicity  REFERRING DIAG: Chronic bilateral thoracic back pain [M54.6, G89.29], MS (multiple sclerosis) ([facility]) [G35]  PERTINENT HISTORY: HIV, MS    PRECAUTIONS/RESTRICTIONS:   MS  SUBJECTIVE: Patient reports improved overall pain today, stating mostly on the L side today  Pain:  Are you having pain? Yes Pain location: L UT region and L shoulder, low back NPRS scale:  current 7/10  Aggravating factors: lifting, sleeping, working Relieving factors: rest Pain description: intermittent, sharp, and aching Stage: Subacute Stability: getting better  OBJECTIVE: (objective measures completed at initial evaluation unless otherwise dated)  DIAGNOSTIC FINDINGS:  MRI CX IMPRESSION: 1. Stable and normal MRI appearance of the cervical spinal cord with no evidence for demyelinating disease or abnormal enhancement. 2. Mild cervical spondylosis at C5-6 with resultant mild to moderate bilateral C6 foraminal stenosis. 3. Additional mild spondylosis elsewhere within the cervical spine without significant stenosis or impingement.   MRI Tx Spine IMPRESSION: 1. Stable demyelinating lesion involving the right dorsolateral cord  at T8-9. No associated enhancement to suggest active demyelination. No new lesions to suggest interval disease progression. 2. Otherwise normal MRI of the thoracic spine.   SENSATION:          Light touch: Appears intact, subjective report of N/T starting in July   MUSCLE LENGTH: Hamstrings: Right no restriction; Left no restriction ASLR: Right ASLR = PSLR; Left ASLR = PSLR          Thoracic AROM   AROM AROM  03/04/2022  Flexion WNL, w/ concordant pain  Extension WNL, w/ concordant pain  Right lateral flexion limited by 25%, w/ concordant pain  Left lateral flexion limited by 25%, w/ concordant pain  Right rotation limited by 25%, w/ concordant pain  Left rotation limited by 25%, w/ concordant pain    (Blank rows = not tested)      LUMBAR AROM   AROM AROM  03/04/2022  Flexion WNL  Extension WNL  Right lateral flexion WNL, w/ concordant pain  Left lateral flexion WNL, w/ concordant pain  Right rotation limited by 25%, w/ concordant pain  Left rotation limited by 25%, w/ concordant pain    (Blank rows = not tested   LE MMT:   MMT Right 03/04/2022 Left 03/04/2022  Hip flexion (L2, L3) 4 4  Knee extension (L3) 4+ 4+  Knee flexion 4+ 4+  Hip abduction 3+ 3+  Hip extension      Hip external rotation      Hip internal rotation      Hip adduction      Ankle dorsiflexion (L4)      Ankle plantarflexion (S1) Partial ROM Partial ROM  Ankle inversion      Ankle eversion      Great Toe ext (L5)      Grossly        (Blank rows = not tested, score listed is out of 5 possible points.  N = WNL, D = diminished, C = clear for gross weakness with myotome testing, * = concordant pain with testing)     UPPER EXTREMITY MMT:   MMT Right 03/04/2022 Left 03/04/2022  Shoulder flexion 4+ 4*  Shoulder abduction (C5) 4+ 4*  Shoulder ER 4+ 4*  Shoulder IR 5 5  Middle trapezius 4 4  Lower trapezius      Shoulder extension      Grip strength R 20 kg L 21 kg  Cervical flexion (C1,C2)      Cervical S/B (C3)      Shoulder shrug (C4)      Elbow flexion (C6)      Elbow ext (C7)      Thumb ext (C8)      Finger abd (T1)      Grossly        (Blank rows = not tested, score listed is out of 5 possible points.  N = WNL, D = diminished, C = clear for gross weakness with myotome testing, * = concordant pain with testing)     Functional Tests   Eval (03/04/2022)       Progressive balance screen (highest level completed for >/= 10''):   Feet together: 10'' Semi Tandem: R in rear 10'', L in rear 10'' Tandem: R in rear 10'', L in rear 10'' SLS: R 5'', L 9''       Supine single leg bridge: L5'', R 5'' partial ROM bil  PALPATION:            TTP costovertebral joints mid and lower thoracic spine (L)    SPINAL SEGMENTAL MOBILITY ASSESSMENT:  See above   PATIENT SURVEYS:  FOTO 44 -> 58     TODAY'S TREATMENT  Creating, reviewing, and completing below HEP   PATIENT EDUCATION:  POC, diagnosis, prognosis, HEP, and outcome measures.  Pt educated via explanation, demonstration, and handout (HEP).  Pt confirms understanding verbally.    HOME EXERCISE PROGRAM: Access Code: [email or web address] URL: [email or web address] Date: 03/04/2022 Prepared by: Shearon Balo   Exercises - Supine Bridge  - 1 x daily - 7 x weekly - 3 sets - 10 reps - Shoulder External Rotation and Scapular Retraction with Resistance  - 1 x daily - 7 x weekly - 3 sets - 10 reps - Hooklying Isometric Clamshell  - 1 x daily - 7 x weekly - 3 sets - 10 reps   ASTERISK SIGNS     Asterisk Signs Eval (03/04/2022)             SLS <10''             SL bridge L 5'', R 5'' (bil partial ROM)             hip abduction 3+ bil                                            TREATMENT 9/28:  Therapeutic Exercise: - nu-step L5 54mwhile taking subjective and planning session with patient - row - Green TB - 2x20 - shoulder ext - red TB - 2x20 - shoulder flexion/extension with dowel - inhale up/exhale down x20 - horizontal abd with GTB in supine- 2x10 - inhale abd/exhale add - diagonals GTB in supine x10 BIL - S/L ER (L) - 2x15 - 2# - S/L abd - 1x10, 1x8 - pain free arc 2# - LTR - 20x - PPT x20   TREATMENT 9/28:  - nu-step L5 [id number] taking subjective and planning session with  patient - row - Green TB - 2x20 - shoulder ext - red TB - 2x20 - shoulder flexion/extension with dowel - inhale up/exhale down - horizontal abd with GTB in supine- 2x10 - inhale abd/exhale add - S/L ER (L) - 2x15 - 2# - S/L abd - 2x10 - pain free arc 2# - LTR - 20x - PPT x20   TREATMENT 9/27:  Therapeutic Exercise: - nu-step L5 [id number] taking subjective and planning session with patient - thoracic rotation - row - Green TB - 2x20 - shoulder ext - red TB - 2x20 - horizontal abd with GTB in supine- 2x10 - S/L ER (L) - 2x15 - 2# - S/L abd - 2x10 - pain free arc - LTR - 20x - PPT x20  With resistance  Manual Therapy - STM bil lumbar paraspinals - UPA and CPA lumbar spine G III - AP GH mobs G III   ASSESSMENT:   CLINICAL IMPRESSION: Patient presents to PT with L sided shoulder, lower back, and hip pain today and reports the R sided pain from last session has resolved. Session today focused on periscapular, RTC, and core strengthening. She had an increase in sharp pain in her L shoulder with sidelying external rotation, terminated exercise early. Patient continues to benefit from skilled PT services and should be progressed as able to  improve functional independence.    OBJECTIVE IMPAIRMENTS: Pain, thoracic ROM, LE strength, UE strength, balance   ACTIVITY LIMITATIONS: walking for long periods, lifting, work, reaching Candice [name]: See medical history and pertinent history     REHAB POTENTIAL: Good   CLINICAL DECISION MAKING: Stable/uncomplicated   EVALUATION COMPLEXITY: Low     GOALS:     SHORT TERM GOALS: Target date: 03/25/2022   Candice [name] will be >75% HEP compliant to improve carryover between sessions and facilitate independent management of condition   Evaluation (03/04/2022): ongoing Goal status: Ongoing 03/26/22: Pt reports non-compliance due to life stressors     LONG TERM GOALS: Target date: 04/29/2022   Candice [name] will improve FOTO score to 58  as a proxy for functional improvement   Evaluation/Baseline (03/04/2022): 48 Goal status: INITIAL     2.  Candice [name] will be able to stand for >30'' in bil SLS stance, to show a significant improvement in balance in order to reduce fall risk    Evaluation/Baseline (03/04/2022): <10'' bil Goal status: INITIAL     3.  Candice [name] will improve the following MMTs to >/= 4/5 to show improvement in strength    Evaluation/Baseline (03/04/2022):    LE MMT:   MMT Right 03/04/2022 Left 03/04/2022  Hip flexion (L2, L3) 4 4  Knee extension (L3) 4+ 4+  Knee flexion 4+ 4+  Hip abduction 3+ 3+  Hip extension      Hip external rotation      Hip internal rotation      Hip adduction      Ankle dorsiflexion (L4)      Ankle plantarflexion (S1) Partial ROM Partial ROM  Ankle inversion      Ankle eversion      Great Toe ext (L5)      Grossly        (Blank rows = not tested, score listed is out of 5 possible points.  N = WNL, D = diminished, C = clear for gross weakness with myotome testing, * = concordant pain with testing)   Goal status: INITIAL     4.  Candice [name] will self report >/= 50% decrease in pain from evaluation    Evaluation/Baseline (03/04/2022): 9/10 max pain Goal status: INITIAL     5.  Candice [name] will be able to perform supine single leg bridge as evidence of improved hip extension and core strength   Evaluation/Baseline (03/04/2022): unable full ROM bil Goal status: INITIAL         PLAN: PT FREQUENCY: 1-2x/week   PT DURATION: 8 weeks (Ending 04/29/2022)   PLANNED INTERVENTIONS: Therapeutic exercises, Aquatic therapy, Therapeutic activity, Neuro Muscular re-education, Gait training, Patient/Family education, Joint mobilization, Dry Needling, Electrical stimulation, Spinal mobilization and/or manipulation, Moist heat, Taping, Vasopneumatic device, Ionotophoresis '4mg'$ /ml Dexamethasone, and Manual therapy   PLAN FOR NEXT SESSION: progressive hip and core strengthening, balance,  shoulder girdle and R/C strengthening, manual therapy PRN    Margarette Canada PTA 03/26/2022, 4:55 PM

## 2022-03-27 ENCOUNTER — Telehealth: Payer: Self-pay | Admitting: Anesthesiology

## 2022-03-27 DIAGNOSIS — G35 Multiple sclerosis: Secondary | ICD-10-CM

## 2022-03-27 DIAGNOSIS — Z79899 Other long term (current) drug therapy: Secondary | ICD-10-CM

## 2022-03-27 NOTE — Telephone Encounter (Signed)
Patient called asking about the liver test that she needs. She had it done a while ago and has not heard from the doctor yet.

## 2022-03-28 NOTE — Telephone Encounter (Signed)
Called patient and informed her that Dr. Tomi Likens stated he doesn't have any results for her liver tests.  She needs this checked every month until her follow up with me (6 months).  If this was performed, we need to have this sent to me.  If it hasn't been performed, she will need this done now and we must make sure that she has it repeated every month.  Patient stated that she wasn't sure where she was supposed to go to have the test done. I informed patient that I will place orders for her and she may come check in at our office and we will send her to the second floor for labs. Patient verbalized understanding and stated she will come Monday. I provided patient with lab office hours.

## 2022-03-31 ENCOUNTER — Other Ambulatory Visit (INDEPENDENT_AMBULATORY_CARE_PROVIDER_SITE_OTHER): Payer: BC Managed Care – PPO

## 2022-03-31 ENCOUNTER — Encounter: Payer: Self-pay | Admitting: Physical Therapy

## 2022-03-31 ENCOUNTER — Ambulatory Visit: Payer: BC Managed Care – PPO | Admitting: Physical Therapy

## 2022-03-31 DIAGNOSIS — M25512 Pain in left shoulder: Secondary | ICD-10-CM | POA: Diagnosis not present

## 2022-03-31 DIAGNOSIS — G35 Multiple sclerosis: Secondary | ICD-10-CM | POA: Diagnosis not present

## 2022-03-31 DIAGNOSIS — M546 Pain in thoracic spine: Secondary | ICD-10-CM

## 2022-03-31 DIAGNOSIS — M6281 Muscle weakness (generalized): Secondary | ICD-10-CM | POA: Diagnosis not present

## 2022-03-31 LAB — HEPATIC FUNCTION PANEL
ALT: 11 U/L (ref 0–35)
AST: 14 U/L (ref 0–37)
Albumin: 4.1 g/dL (ref 3.5–5.2)
Alkaline Phosphatase: 44 U/L (ref 39–117)
Bilirubin, Direct: 0 mg/dL (ref 0.0–0.3)
Total Bilirubin: 0.3 mg/dL (ref 0.2–1.2)
Total Protein: 7.3 g/dL (ref 6.0–8.3)

## 2022-03-31 NOTE — Therapy (Signed)
OUTPATIENT PHYSICAL THERAPY TREATMENT NOTE   Patient Name: Candice [name] MRN: [id number] DOB:1985/10/14, 36 y.o., female Today's Date: 03/31/2022  PCP: Precious Gilding, DO   REFERRING PROVIDER: Glennon Mac, DO   PT End of Session - 03/31/22 1614     Visit Number 5    Date for PT Re-Evaluation 04/29/22    Authorization Type [employer]    PT Start Time 0415    PT Stop Time 0456    PT Time Calculation (min) 41 min    Activity Tolerance Patient tolerated treatment well    Behavior During Therapy [facility] for tasks assessed/performed               Past Medical History:  Diagnosis Date   Complication of anesthesia    epidural with last pregnancy made entire left side numb, had to d/c   Depression    Headache    HSV 06/28/2007   Hx of migraines    Past Surgical History:  Procedure Laterality Date   BREAST SURGERY  2019   reduction   CESAREAN SECTION     CESAREAN SECTION  09/07/2011   Procedure: CESAREAN SECTION;  Surgeon: Florian Buff, MD;  Location: [facility];  Service: Gynecology;  Laterality: N/A;  Repeat cesarean section with delivery of baby boy at 21. Apgars 9/9.  Bilateral tubal ligation.   IUD REMOVAL  2005   in cervix   TUBAL LIGATION     tubal reanastomosis  08/13/2016   Patient Active Problem List   Diagnosis Date Noted   Constipation 02/18/2022   Arthralgia 02/18/2022   Depression, major, single episode, mild ([place]) 01/13/2022   Cyst of vulva 09/19/2021   Frequent urination 09/11/2021   UTI symptoms 07/30/2021   Medication monitoring encounter 01/11/2021   MS (multiple sclerosis) ([place]) 05/21/2020   GERD (gastroesophageal reflux disease) 05/21/2020   Right-sided abdominal pain of unknown etiology 04/06/2020   Abnormal MRI, spine 04/06/2020   Metatarsalgia of right foot 04/06/2020   Healthcare maintenance 02/10/2020   Right leg numbness 02/07/2020   HIV (human immunodeficiency virus infection) ([place]) 09/07/2019   Hemorrhagic cyst of left ovary 10/14/2016    Abdominal pain 04/16/2016   MRSA colonization 09/25/2014   Major depressive disorder, recurrent episode, moderate ([place]) 06/08/2013   Genital herpes 02/16/2013   Back pain 10/13/2011   Vaginal discharge 10/10/2010   Cervical cancer screening 10/10/2010   OBESITY, NOS 08/20/2006    THERAPY DIAG:  Pain in thoracic spine  Muscle weakness  Left shoulder pain, unspecified chronicity  REFERRING DIAG: Chronic bilateral thoracic back pain [M54.6, G89.29], MS (multiple sclerosis) ([facility]) [G35]  PERTINENT HISTORY: HIV, MS    PRECAUTIONS/RESTRICTIONS:   MS  SUBJECTIVE: Pt reports that her back feels much better today.  Her shoulder is improving slowly.  Pain:  Are you having pain? Yes Pain location: L UT region and L shoulder, low back NPRS scale:  current 4/10 L shoulder, 0/10 low back Aggravating factors: lifting, sleeping, working Relieving factors: rest Pain description: intermittent, sharp, and aching Stage: Subacute Stability: getting better  OBJECTIVE: (objective measures completed at initial evaluation unless otherwise dated)  DIAGNOSTIC FINDINGS:  MRI CX IMPRESSION: 1. Stable and normal MRI appearance of the cervical spinal cord with no evidence for demyelinating disease or abnormal enhancement. 2. Mild cervical spondylosis at C5-6 with resultant mild to moderate bilateral C6 foraminal stenosis. 3. Additional mild spondylosis elsewhere within the cervical spine without significant stenosis or impingement.   MRI Tx Spine IMPRESSION: 1. Stable demyelinating  lesion involving the right dorsolateral cord at T8-9. No associated enhancement to suggest active demyelination. No new lesions to suggest interval disease progression. 2. Otherwise normal MRI of the thoracic spine.   SENSATION:          Light touch: Appears intact, subjective report of N/T starting in July   MUSCLE LENGTH: Hamstrings: Right no restriction; Left no restriction ASLR: Right ASLR = PSLR; Left  ASLR = PSLR         Thoracic AROM   AROM AROM  03/04/2022  Flexion WNL, w/ concordant pain  Extension WNL, w/ concordant pain  Right lateral flexion limited by 25%, w/ concordant pain  Left lateral flexion limited by 25%, w/ concordant pain  Right rotation limited by 25%, w/ concordant pain  Left rotation limited by 25%, w/ concordant pain    (Blank rows = not tested)      LUMBAR AROM   AROM AROM  03/04/2022  Flexion WNL  Extension WNL  Right lateral flexion WNL, w/ concordant pain  Left lateral flexion WNL, w/ concordant pain  Right rotation limited by 25%, w/ concordant pain  Left rotation limited by 25%, w/ concordant pain    (Blank rows = not tested   LE MMT:   MMT Right 03/04/2022 Left 03/04/2022  Hip flexion (L2, L3) 4 4  Knee extension (L3) 4+ 4+  Knee flexion 4+ 4+  Hip abduction 3+ 3+  Hip extension      Hip external rotation      Hip internal rotation      Hip adduction      Ankle dorsiflexion (L4)      Ankle plantarflexion (S1) Partial ROM Partial ROM  Ankle inversion      Ankle eversion      Great Toe ext (L5)      Grossly        (Blank rows = not tested, score listed is out of 5 possible points.  N = WNL, D = diminished, C = clear for gross weakness with myotome testing, * = concordant pain with testing)     UPPER EXTREMITY MMT:   MMT Right 03/04/2022 Left 03/04/2022  Shoulder flexion 4+ 4*  Shoulder abduction (C5) 4+ 4*  Shoulder ER 4+ 4*  Shoulder IR 5 5  Middle trapezius 4 4  Lower trapezius      Shoulder extension      Grip strength R 20 kg L 21 kg  Cervical flexion (C1,C2)      Cervical S/B (C3)      Shoulder shrug (C4)      Elbow flexion (C6)      Elbow ext (C7)      Thumb ext (C8)      Finger abd (T1)      Grossly        (Blank rows = not tested, score listed is out of 5 possible points.  N = WNL, D = diminished, C = clear for gross weakness with myotome testing, * = concordant pain with testing)     Functional Tests    Eval (03/04/2022)      Progressive balance screen (highest level completed for >/= 10''):   Feet together: 10'' Semi Tandem: R in rear 10'', L in rear 10'' Tandem: R in rear 10'', L in rear 10'' SLS: R 5'', L 9''       Supine single leg bridge: L5'', R 5'' partial ROM bil  PALPATION:            TTP costovertebral joints mid and lower thoracic spine (L)    SPINAL SEGMENTAL MOBILITY ASSESSMENT:  See above   PATIENT SURVEYS:  FOTO 44 -> 58     TODAY'S TREATMENT  Creating, reviewing, and completing below HEP   PATIENT EDUCATION:  POC, diagnosis, prognosis, HEP, and outcome measures.  Pt educated via explanation, demonstration, and handout (HEP).  Pt confirms understanding verbally.    HOME EXERCISE PROGRAM: Access Code: [email or web address] URL: [email or web address] Date: 03/31/2022 Prepared by: Shearon Balo  Exercises - Shoulder External Rotation and Scapular Retraction with Resistance  - 1 x daily - 7 x weekly - 3 sets - 10 reps - Hooklying Isometric Clamshell  - 1 x daily - 7 x weekly - 3 sets - 10 reps - Staggered Bridge  - 1 x daily - 7 x weekly - 3 sets - 10 reps   ASTERISK SIGNS     Asterisk Signs Eval (03/04/2022)             SLS <10''             SL bridge L 5'', R 5'' (bil partial ROM)             hip abduction 3+ bil                                            TREATMENT 10/9:  Therapeutic Exercise: - nu-step L5 52mwhile taking subjective and planning session with patient  - horizontal abd with blue TB in supine- 2x10 - inhale abd/exhale add (consider ITY next visit)  - supine chest press with + 3x10 @ 3# - pain free arc - supine shoulder flexion - 2x10 - 3# - S/L ER (L) - 3x15 - 2# - S/L abd - 2x10 pain free arc 2#  - staggered bridge - 2x10 ea - S/L clam - blue TB - 15x ea - stopped d/t pain - abdominal isometric squeeze - 5''  2x10  Manual Therapy - STM bil lumbar paraspinals - UPA and CPA lumbar spine G III - AP GH mobs G III  TREATMENT 9/28:  Therapeutic Exercise: - nu-step L5 816mhile taking subjective and planning session with patient - row - Green TB - 2x20 - shoulder ext - red TB - 2x20 - shoulder flexion/extension with dowel - inhale up/exhale down x20 - horizontal abd with GTB in supine- 2x10 - inhale abd/exhale add - diagonals GTB in supine x10 BIL - S/L ER (L) - 2x15 - 2# - S/L abd - 1x10, 1x8 - pain free arc 2# - LTR - 20x - PPT x20   TREATMENT 9/28:  - nu-step L5 48m4mile taking subjective and planning session with patient - row - Green TB - 2x20 - shoulder ext - red TB - 2x20 - shoulder flexion/extension with dowel - inhale up/exhale down - horizontal abd with GTB in supine- 2x10 - inhale abd/exhale add - S/L ER (L) - 2x15 - 2# - S/L abd - 2x10 - pain free arc 2# - LTR - 20x - PPT x20   TREATMENT 9/27:  Therapeutic Exercise: - nu-step L5 70m 46mle taking subjective and planning session with patient - thoracic rotation - row - Green TB - 2x20 - shoulder ext - red TB - 2x20 - horizontal abd with GTB in supine- 2x10 -  S/L ER (L) - 2x15 - 2# - S/L abd - 2x10 - pain free arc - LTR - 20x - PPT x20  With resistance  Manual Therapy - STM bil lumbar paraspinals - UPA and CPA lumbar spine G III - AP GH mobs G III   ASSESSMENT:   CLINICAL IMPRESSION: Harris tolerated session well with no adverse reaction.  Her back is feeling much better and we were able to advance intensity of mat exercises today.  We continue to work on periscapular and R/C strengthening to address L shoulder pain.  She responds well to manual therapy.   OBJECTIVE IMPAIRMENTS: Pain, thoracic ROM, LE strength, UE strength, balance   ACTIVITY LIMITATIONS: walking for long periods, lifting, work, reaching [place]: See medical history and pertinent history     REHAB POTENTIAL: Good   CLINICAL  DECISION MAKING: Stable/uncomplicated   EVALUATION COMPLEXITY: Low     GOALS:     SHORT TERM GOALS: Target date: 03/25/2022   [place] will be >75% HEP compliant to improve carryover between sessions and facilitate independent management of condition   Evaluation (03/04/2022): ongoing Goal status: Ongoing 03/26/22: Pt reports non-compliance due to life stressors     LONG TERM GOALS: Target date: 04/29/2022   Miliana will improve FOTO score to 58 as a proxy for functional improvement   Evaluation/Baseline (03/04/2022): 48 Goal status: INITIAL     2.  Carlisia will be able to stand for >30'' in bil SLS stance, to show a significant improvement in balance in order to reduce fall risk    Evaluation/Baseline (03/04/2022): <10'' bil Goal status: INITIAL     3.  Alece will improve the following MMTs to >/= 4/5 to show improvement in strength    Evaluation/Baseline (03/04/2022):    LE MMT:   MMT Right 03/04/2022 Left 03/04/2022  Hip flexion (L2, L3) 4 4  Knee extension (L3) 4+ 4+  Knee flexion 4+ 4+  Hip abduction 3+ 3+  Hip extension      Hip external rotation      Hip internal rotation      Hip adduction      Ankle dorsiflexion (L4)      Ankle plantarflexion (S1) Partial ROM Partial ROM  Ankle inversion      Ankle eversion      Great Toe ext (L5)      Grossly        (Blank rows = not tested, score listed is out of 5 possible points.  N = WNL, D = diminished, C = clear for gross weakness with myotome testing, * = concordant pain with testing)   Goal status: INITIAL     4.  Lauriana will self report >/= 50% decrease in pain from evaluation    Evaluation/Baseline (03/04/2022): 9/10 max pain Goal status: INITIAL     5.  Shabrea will be able to perform supine single leg bridge as evidence of improved hip extension and core strength   Evaluation/Baseline (03/04/2022): unable full ROM bil Goal status: INITIAL         PLAN: PT FREQUENCY: 1-2x/week   PT  DURATION: 8 weeks (Ending 04/29/2022)   PLANNED INTERVENTIONS: Therapeutic exercises, Aquatic therapy, Therapeutic activity, Neuro Muscular re-education, Gait training, Patient/Family education, Joint mobilization, Dry Needling, Electrical stimulation, Spinal mobilization and/or manipulation, Moist heat, Taping, Vasopneumatic device, Ionotophoresis '4mg'$ /ml Dexamethasone, and Manual therapy   PLAN FOR NEXT SESSION: progressive hip and core strengthening, balance, shoulder girdle and R/C strengthening, manual therapy  PRN    Kevan Ny Shrey Boike PT 03/31/2022, 5:00 PM

## 2022-04-02 ENCOUNTER — Ambulatory Visit: Payer: BC Managed Care – PPO | Admitting: Physical Therapy

## 2022-04-02 ENCOUNTER — Encounter: Payer: Self-pay | Admitting: Physical Therapy

## 2022-04-02 DIAGNOSIS — M6281 Muscle weakness (generalized): Secondary | ICD-10-CM

## 2022-04-02 DIAGNOSIS — M546 Pain in thoracic spine: Secondary | ICD-10-CM

## 2022-04-02 DIAGNOSIS — M25512 Pain in left shoulder: Secondary | ICD-10-CM

## 2022-04-02 NOTE — Therapy (Signed)
OUTPATIENT PHYSICAL THERAPY TREATMENT NOTE   Patient Name: Candice [name] MRN: [id number] DOB:1985/10/30, 36 y.o., female 40 Date: 04/02/2022  PCP: Precious Gilding, DO   REFERRING PROVIDER: Glennon Mac, DO   PT End of Session - 04/02/22 1613     Visit Number 6    Date for PT Re-Evaluation 04/29/22    Authorization Type BC[employer]    PT Start Time 0415    PT Stop Time 0456    PT Time Calculation (min) 41 min    Activity Tolerance Patient tolerated treatment well    Behavior During Therapy [facility] for tasks assessed/performed               Past Medical History:  Diagnosis Date   Complication of anesthesia    epidural with last pregnancy made entire left side numb, had to d/c   Depression    Headache    HSV 06/28/2007   Hx of migraines    Past Surgical History:  Procedure Laterality Date   BREAST SURGERY  2019   reduction   CESAREAN SECTION     CESAREAN SECTION  09/07/2011   Procedure: CESAREAN SECTION;  Surgeon: Florian Buff, MD;  Location: [facility];  Service: Gynecology;  Laterality: N/A;  Repeat cesarean section with delivery of baby boy at 6. Apgars 9/9.  Bilateral tubal ligation.   IUD REMOVAL  2005   in cervix   TUBAL LIGATION     tubal reanastomosis  08/13/2016   Patient Active Problem List   Diagnosis Date Noted   Constipation 02/18/2022   Arthralgia 02/18/2022   Depression, major, single episode, mild ([place]) 01/13/2022   Cyst of vulva 09/19/2021   Frequent urination 09/11/2021   UTI symptoms 07/30/2021   Medication monitoring encounter 01/11/2021   MS (multiple sclerosis) ([place]) 05/21/2020   GERD (gastroesophageal reflux disease) 05/21/2020   Right-sided abdominal pain of unknown etiology 04/06/2020   Abnormal MRI, spine 04/06/2020   Metatarsalgia of right foot 04/06/2020   Healthcare maintenance 02/10/2020   Right leg numbness 02/07/2020   HIV (human immunodeficiency virus infection) ([place]) 09/07/2019   Hemorrhagic cyst of left ovary  10/14/2016   Abdominal pain 04/16/2016   MRSA colonization 09/25/2014   Major depressive disorder, recurrent episode, moderate ([place]) 06/08/2013   Genital herpes 02/16/2013   Back pain 10/13/2011   Vaginal discharge 10/10/2010   Cervical cancer screening 10/10/2010   OBESITY, NOS 08/20/2006    THERAPY DIAG:  Pain in thoracic spine  Muscle weakness  Left shoulder pain, unspecified chronicity  REFERRING DIAG: Chronic bilateral thoracic back pain [M54.6, G89.29], MS (multiple sclerosis) (HCC) [G35]  PERTINENT HISTORY: HIV, MS    PRECAUTIONS/RESTRICTIONS:   MS  SUBJECTIVE:   Pt reports that her low back pain is significantly reduced.  She is still having quite a pit of pain in her shoulders which radiates up to her neck.  Pain:  Are you having pain? Yes Pain location: L UT region and L shoulder, low back NPRS scale:  current 5/10 L shoulder, 3/10 low back Aggravating factors: lifting, sleeping, working Relieving factors: rest Pain description: intermittent, sharp, and aching Stage: Subacute Stability: getting better  OBJECTIVE: (objective measures completed at initial evaluation unless otherwise dated)  DIAGNOSTIC FINDINGS:  MRI CX IMPRESSION: 1. Stable and normal MRI appearance of the cervical spinal cord with no evidence for demyelinating disease or abnormal enhancement. 2. Mild cervical spondylosis at C5-6 with resultant mild to moderate bilateral C6 foraminal stenosis. 3. Additional mild spondylosis elsewhere within the  cervical spine without significant stenosis or impingement.   MRI Tx Spine IMPRESSION: 1. Stable demyelinating lesion involving the right dorsolateral cord at T8-9. No associated enhancement to suggest active demyelination. No new lesions to suggest interval disease progression. 2. Otherwise normal MRI of the thoracic spine.   SENSATION:          Light touch: Appears intact, subjective report of N/T starting in July   MUSCLE  LENGTH: Hamstrings: Right no restriction; Left no restriction ASLR: Right ASLR = PSLR; Left ASLR = PSLR         Thoracic AROM   AROM AROM  03/04/2022  Flexion WNL, w/ concordant pain  Extension WNL, w/ concordant pain  Right lateral flexion limited by 25%, w/ concordant pain  Left lateral flexion limited by 25%, w/ concordant pain  Right rotation limited by 25%, w/ concordant pain  Left rotation limited by 25%, w/ concordant pain    (Blank rows = not tested)      LUMBAR AROM   AROM AROM  03/04/2022  Flexion WNL  Extension WNL  Right lateral flexion WNL, w/ concordant pain  Left lateral flexion WNL, w/ concordant pain  Right rotation limited by 25%, w/ concordant pain  Left rotation limited by 25%, w/ concordant pain    (Blank rows = not tested   LE MMT:   MMT Right 03/04/2022 Left 03/04/2022  Hip flexion (L2, L3) 4 4  Knee extension (L3) 4+ 4+  Knee flexion 4+ 4+  Hip abduction 3+ 3+  Hip extension      Hip external rotation      Hip internal rotation      Hip adduction      Ankle dorsiflexion (L4)      Ankle plantarflexion (S1) Partial ROM Partial ROM  Ankle inversion      Ankle eversion      Great Toe ext (L5)      Grossly        (Blank rows = not tested, score listed is out of 5 possible points.  N = WNL, D = diminished, C = clear for gross weakness with myotome testing, * = concordant pain with testing)     UPPER EXTREMITY MMT:   MMT Right 03/04/2022 Left 03/04/2022  Shoulder flexion 4+ 4*  Shoulder abduction (C5) 4+ 4*  Shoulder ER 4+ 4*  Shoulder IR 5 5  Middle trapezius 4 4  Lower trapezius      Shoulder extension      Grip strength R 20 kg L 21 kg  Cervical flexion (C1,C2)      Cervical S/B (C3)      Shoulder shrug (C4)      Elbow flexion (C6)      Elbow ext (C7)      Thumb ext (C8)      Finger abd (T1)      Grossly        (Blank rows = not tested, score listed is out of 5 possible points.  N = WNL, D = diminished, C = clear for gross  weakness with myotome testing, * = concordant pain with testing)     Functional Tests   Eval (03/04/2022)      Progressive balance screen (highest level completed for >/= 10''):   Feet together: 10'' Semi Tandem: R in rear 10'', L in rear 10'' Tandem: R in rear 10'', L in rear 10'' SLS: R 5'', L 9''       Supine single leg bridge:  L5'', R 5'' partial ROM bil                                                                                                 PALPATION:            TTP costovertebral joints mid and lower thoracic spine (L)    SPINAL SEGMENTAL MOBILITY ASSESSMENT:  See above   PATIENT SURVEYS:  FOTO 44 -> 58     TODAY'S TREATMENT  Creating, reviewing, and completing below HEP   PATIENT EDUCATION:  POC, diagnosis, prognosis, HEP, and outcome measures.  Pt educated via explanation, demonstration, and handout (HEP).  Pt confirms understanding verbally.    HOME EXERCISE PROGRAM: Access Code: [email or web address] URL: [email or web address] Date: 03/31/2022 Prepared by: Shearon Balo  Exercises - Shoulder External Rotation and Scapular Retraction with Resistance  - 1 x daily - 7 x weekly - 3 sets - 10 reps - Hooklying Isometric Clamshell  - 1 x daily - 7 x weekly - 3 sets - 10 reps - Staggered Bridge  - 1 x daily - 7 x weekly - 3 sets - 10 reps   ASTERISK SIGNS     Asterisk Signs Eval (03/04/2022)             SLS <10''             SL bridge L 5'', R 5'' (bil partial ROM)             hip abduction 3+ bil                                            TREATMENT 10/11:  Therapeutic Exercise: - nu-step L6 13mwhile taking subjective and planning session with patient  - horizontal abd with black TB in supine- 3x10 - inhale abd/exhale add (consider ITY next visit)  - supine chest press with + 3x10 @ 3# - pain free arc - bil supine shoulder flexion - 2x10 - 3# - S/L ER (L) - 2x10 - 3# - S/L abd - 2x10 pain free arc 3#  - LTR - 20x - staggered  bridge - 3x10 ea - supine clam - black TB - 3x10 - abdominal isometric squeeze - 5'' 2x10 - alternating SLR from foam roller - 2x10  Manual Therapy - STM bil UT and sub occipitals - sub occipital release  TREATMENT 10/9:  Therapeutic Exercise: - nu-step L5 864mhile taking subjective and planning session with patient  - horizontal abd with blue TB in supine- 2x10 - inhale abd/exhale add (consider ITY next visit)  - supine chest press with + 3x10 @ 3# - pain free arc - supine shoulder flexion - 2x10 - 3# - S/L ER (L) - 3x15 - 2# - S/L abd - 2x10 pain free arc 2#  - staggered bridge - 2x10 ea - S/L clam - blue TB - 15x ea - stopped d/t pain - abdominal isometric squeeze - 5'' 2x10  Manual Therapy - STM bil lumbar  paraspinals - UPA and CPA lumbar spine G III - AP GH mobs G III  TREATMENT 9/28:  Therapeutic Exercise: - nu-step L5 34mwhile taking subjective and planning session with patient - row - Green TB - 2x20 - shoulder ext - red TB - 2x20 - shoulder flexion/extension with dowel - inhale up/exhale down x20 - horizontal abd with GTB in supine- 2x10 - inhale abd/exhale add - diagonals GTB in supine x10 BIL - S/L ER (L) - 2x15 - 2# - S/L abd - 1x10, 1x8 - pain free arc 2# - LTR - 20x - PPT x20   TREATMENT 9/28:  - nu-step L5 8367mhile taking subjective and planning session with patient - row - Green TB - 2x20 - shoulder ext - red TB - 2x20 - shoulder flexion/extension with dowel - inhale up/exhale down - horizontal abd with GTB in supine- 2x10 - inhale abd/exhale add - S/L ER (L) - 2x15 - 2# - S/L abd - 2x10 - pain free arc 2# - LTR - 20x - PPT x20   TREATMENT 9/27:  Therapeutic Exercise: - nu-step L5 67m30mile taking subjective and planning session with patient - thoracic rotation - row - Green TB - 2x20 - shoulder ext - red TB - 2x20 - horizontal abd with GTB in supine- 2x10 - S/L ER (L) - 2x15 - 2# - S/L abd - 2x10 - pain free arc - LTR - 20x - PPT  x20  With resistance  Manual Therapy - STM bil lumbar paraspinals - UPA and CPA lumbar spine G III - AP GH mobs G III   ASSESSMENT:   CLINICAL IMPRESSION: Taysha tolerated session well with no adverse reaction.  We continued core strength progression for the low back as well as periscapular and R/C strengthening for the L shoulder.  We added in manual therapy for increased tension in the bil UT and sub occipitals to good effect.   OBJECTIVE IMPAIRMENTS: Pain, thoracic ROM, LE strength, UE strength, balance   ACTIVITY LIMITATIONS: walking for long periods, lifting, work, reaching [place] [facility] and pertinent history     REHAB POTENTIAL: Good   CLINICAL DECISION MAKING: Stable/uncomplicated   EVALUATION COMPLEXITY: Low     GOALS:     SHORT TERM GOALS: Target date: 03/25/2022   SheArianll be >75% HEP compliant to improve carryover between sessions and facilitate independent management of condition   Evaluation (03/04/2022): ongoing Goal status: Ongoing 03/26/22: Pt reports non-compliance due to life stressors     LONG TERM GOALS: Target date: 04/29/2022   SheNgall improve FOTO score to 58 as a proxy for functional improvement   Evaluation/Baseline (03/04/2022): 48 Goal status: INITIAL     2.  Karalyn will be able to stand for >30'' in bil SLS stance, to show a significant improvement in balance in order to reduce fall risk    Evaluation/Baseline (03/04/2022): <10'' bil Goal status: INITIAL     3.  Roselyn will improve the following MMTs to >/= 4/5 to show improvement in strength    Evaluation/Baseline (03/04/2022):    LE MMT:   MMT Right 03/04/2022 Left 03/04/2022  Hip flexion (L2, L3) 4 4  Knee extension (L3) 4+ 4+  Knee flexion 4+ 4+  Hip abduction 3+ 3+  Hip extension      Hip external rotation      Hip internal rotation      Hip adduction      Ankle dorsiflexion (  L4)      Ankle plantarflexion (S1) Partial ROM  Partial ROM  Ankle inversion      Ankle eversion      Great Toe ext (L5)      Grossly        (Blank rows = not tested, score listed is out of 5 possible points.  N = WNL, D = diminished, C = clear for gross weakness with myotome testing, * = concordant pain with testing)   Goal status: INITIAL     4.  Karmen will self report >/= 50% decrease in pain from evaluation    Evaluation/Baseline (03/04/2022): 9/10 max pain Goal status: INITIAL     5.  Shadoe will be able to perform supine single leg bridge as evidence of improved hip extension and core strength   Evaluation/Baseline (03/04/2022): unable full ROM bil Goal status: INITIAL         PLAN: PT FREQUENCY: 1-2x/week   PT DURATION: 8 weeks (Ending 04/29/2022)   PLANNED INTERVENTIONS: Therapeutic exercises, Aquatic therapy, Therapeutic activity, Neuro Muscular re-education, Gait training, Patient/Family education, Joint mobilization, Dry Needling, Electrical stimulation, Spinal mobilization and/or manipulation, Moist heat, Taping, Vasopneumatic device, Ionotophoresis '4mg'$ /ml Dexamethasone, and Manual therapy   PLAN FOR NEXT SESSION: progressive hip and core strengthening, balance, shoulder girdle and R/C strengthening, manual therapy PRN    Kevan Ny Miraj Truss PT 04/02/2022, 4:57 PM

## 2022-04-04 NOTE — Progress Notes (Unsigned)
Benito Mccreedy D.[place] Phone: [phone]   Assessment and Plan:     There are no diagnoses linked to this encounter.  ***   Pertinent previous records reviewed include ***   Follow Up: ***     Subjective:   I, Candice [name], am serving as a Education administrator for Doctor Glennon Mac   Chief Complaint: thoracic back pain    HPI:    02/17/2022 Patient is a 36 year old female complaining of thoracic back pain. Patient states  Beginning around 2018, she began having intermittent episodes of numbness, weakness and dizziness.  In May or June of that year, she began having a recurrence of symptoms, including right flank pain and numbness radiating down her right leg with some associated right leg weakness.  She felt off balance.  She saw neurology at that time.  MRI of thoracic spine revealed subtle enhancing hyperintense focus at the T8-T9 region.would like to discuss OMT    03/10/2022 Patient states that she is alright, left side back is still tight and shoulders    04/07/2022 Patient states  Relevant Historical Information: Multiple sclerosis  Additional pertinent review of systems negative.   Current Outpatient Medications:    cabotegravir & rilpivirine ER (CABENUVA) 600 & 900 MG/3ML injection, Inject 1 kit into the muscle every 30 (thirty) days., Disp: 6 mL, Rfl: 1   cyclobenzaprine (FLEXERIL) 10 MG tablet, TAKE 1 TABLET BY MOUTH THREE TIMES A DAY AS NEEDED FOR MUSCLE SPASMS, Disp: 30 tablet, Rfl: 2   hydrOXYzine (ATARAX) 25 MG tablet, Take 1-2 tablets (25-50 mg total) by mouth 3 (three) times daily as needed., Disp: 30 tablet, Rfl: 0   naproxen sodium (ALEVE) 220 MG tablet, Take 220 mg by mouth daily as needed (For pain)., Disp: , Rfl:    ondansetron (ZOFRAN) 4 MG tablet, Take 1 tablet (4 mg total) by mouth every 8 (eight) hours as needed for nausea or vomiting., Disp: 20 tablet, Rfl: 0    oxyCODONE-acetaminophen (PERCOCET/ROXICET) 5-325 MG tablet, Take 1 tablet by mouth every 8 (eight) hours as needed for up to 12 doses for severe pain., Disp: 12 tablet, Rfl: 0   psyllium (METAMUCIL) 58.6 % packet, Take 1 packet by mouth daily., Disp: , Rfl:    senna-docusate (SENOKOT-S) 8.6-50 MG tablet, Take 1 tablet by mouth daily., Disp: 14 tablet, Rfl: 0   Teriflunomide 14 MG TABS, Take 14 mg by mouth daily., Disp: 30 tablet, Rfl: 11   valACYclovir (VALTREX) 1000 MG tablet, Take 1 tablet (1,000 mg total) by mouth 2 (two) times daily., Disp: 10 tablet, Rfl: 2   Objective:     There were no vitals filed for this visit.    There is no height or weight on file to calculate BMI.    Physical Exam:    ***   Electronically signed by:  Benito Mccreedy D.[name] 3:52 PM 04/04/22

## 2022-04-07 ENCOUNTER — Ambulatory Visit (INDEPENDENT_AMBULATORY_CARE_PROVIDER_SITE_OTHER): Payer: BC Managed Care – PPO | Admitting: Sports Medicine

## 2022-04-07 ENCOUNTER — Encounter: Payer: Self-pay | Admitting: Neurology

## 2022-04-07 VITALS — BP 130/86 | HR 85 | Ht 65.0 in | Wt 193.0 lb

## 2022-04-07 DIAGNOSIS — M9905 Segmental and somatic dysfunction of pelvic region: Secondary | ICD-10-CM

## 2022-04-07 DIAGNOSIS — M9902 Segmental and somatic dysfunction of thoracic region: Secondary | ICD-10-CM | POA: Diagnosis not present

## 2022-04-07 DIAGNOSIS — M9908 Segmental and somatic dysfunction of rib cage: Secondary | ICD-10-CM

## 2022-04-07 DIAGNOSIS — M546 Pain in thoracic spine: Secondary | ICD-10-CM | POA: Diagnosis not present

## 2022-04-07 DIAGNOSIS — G35 Multiple sclerosis: Secondary | ICD-10-CM

## 2022-04-07 DIAGNOSIS — G8929 Other chronic pain: Secondary | ICD-10-CM | POA: Diagnosis not present

## 2022-04-07 DIAGNOSIS — M9903 Segmental and somatic dysfunction of lumbar region: Secondary | ICD-10-CM | POA: Diagnosis not present

## 2022-04-07 DIAGNOSIS — G35D Multiple sclerosis, unspecified: Secondary | ICD-10-CM

## 2022-04-07 DIAGNOSIS — M9901 Segmental and somatic dysfunction of cervical region: Secondary | ICD-10-CM

## 2022-04-07 NOTE — Patient Instructions (Signed)
Good to see you   

## 2022-04-08 ENCOUNTER — Ambulatory Visit: Payer: BC Managed Care – PPO

## 2022-04-08 DIAGNOSIS — M6281 Muscle weakness (generalized): Secondary | ICD-10-CM | POA: Diagnosis not present

## 2022-04-08 DIAGNOSIS — M25512 Pain in left shoulder: Secondary | ICD-10-CM

## 2022-04-08 DIAGNOSIS — M546 Pain in thoracic spine: Secondary | ICD-10-CM

## 2022-04-08 NOTE — Therapy (Signed)
OUTPATIENT PHYSICAL THERAPY TREATMENT NOTE   Patient Name: Candice [name] MRN: [id number] DOB:08/31/1985, 36 y.o., female 4 Date: 04/08/2022  PCP: Precious Gilding, DO   REFERRING PROVIDER: Glennon Mac, DO   PT End of Session - 04/08/22 1612     Visit Number 7    Date for PT Re-Evaluation 04/29/22    Authorization Type BCBS    PT Start Time 1615    PT Stop Time 1655    PT Time Calculation (min) 40 min    Activity Tolerance Patient tolerated treatment well    Behavior During Therapy [facility] for tasks assessed/performed                Past Medical History:  Diagnosis Date   Complication of anesthesia    epidural with last pregnancy made entire left side numb, had to d/c   Depression    Headache    HSV 06/28/2007   Hx of migraines    Past Surgical History:  Procedure Laterality Date   BREAST SURGERY  2019   reduction   CESAREAN SECTION     CESAREAN SECTION  09/07/2011   Procedure: CESAREAN SECTION;  Surgeon: Florian Buff, MD;  Location: [facility];  Service: Gynecology;  Laterality: N/A;  Repeat cesarean section with delivery of baby boy at 45. Apgars 9/9.  Bilateral tubal ligation.   IUD REMOVAL  2005   in cervix   TUBAL LIGATION     tubal reanastomosis  08/13/2016   Patient Active Problem List   Diagnosis Date Noted   Constipation 02/18/2022   Arthralgia 02/18/2022   Depression, major, single episode, mild ([place]) 01/13/2022   Cyst of vulva 09/19/2021   Frequent urination 09/11/2021   UTI symptoms 07/30/2021   Medication monitoring encounter 01/11/2021   MS (multiple sclerosis) ([place]) 05/21/2020   GERD (gastroesophageal reflux disease) 05/21/2020   Right-sided abdominal pain of unknown etiology 04/06/2020   Abnormal MRI, spine 04/06/2020   Metatarsalgia of right foot 04/06/2020   Healthcare maintenance 02/10/2020   Right leg numbness 02/07/2020   HIV (human immunodeficiency virus infection) ([place]) 09/07/2019   Hemorrhagic cyst of left ovary  10/14/2016   Abdominal pain 04/16/2016   MRSA colonization 09/25/2014   Major depressive disorder, recurrent episode, moderate ([place]) 06/08/2013   Genital herpes 02/16/2013   Back pain 10/13/2011   Vaginal discharge 10/10/2010   Cervical cancer screening 10/10/2010   OBESITY, NOS 08/20/2006    THERAPY DIAG:  Pain in thoracic spine  Muscle weakness  Left shoulder pain, unspecified chronicity  REFERRING DIAG: Chronic bilateral thoracic back pain [M54.6, G89.29], MS (multiple sclerosis) (HCC) [G35]  PERTINENT HISTORY: HIV, MS    PRECAUTIONS/RESTRICTIONS:   MS  SUBJECTIVE:   Pt reports 3-4/10 BIL shoulder pain today. She adds that HEP is going well.   Pain:  Are you having pain? Yes Pain location: L UT region and L shoulder, low back NPRS scale:  current 3-4/10 L shoulder, 0/10 low back Aggravating factors: lifting, sleeping, working Relieving factors: rest Pain description: intermittent, sharp, and aching Stage: Subacute Stability: getting better  OBJECTIVE: (objective measures completed at initial evaluation unless otherwise dated)  DIAGNOSTIC FINDINGS:  MRI CX IMPRESSION: 1. Stable and normal MRI appearance of the cervical spinal cord with no evidence for demyelinating disease or abnormal enhancement. 2. Mild cervical spondylosis at C5-6 with resultant mild to moderate bilateral C6 foraminal stenosis. 3. Additional mild spondylosis elsewhere within the cervical spine without significant stenosis or impingement.   MRI Tx Spine IMPRESSION:  1. Stable demyelinating lesion involving the right dorsolateral cord at T8-9. No associated enhancement to suggest active demyelination. No new lesions to suggest interval disease progression. 2. Otherwise normal MRI of the thoracic spine.   SENSATION:          Light touch: Appears intact, subjective report of N/T starting in July   MUSCLE LENGTH: Hamstrings: Right no restriction; Left no restriction ASLR: Right ASLR =  PSLR; Left ASLR = PSLR         Thoracic AROM   AROM AROM  03/04/2022  Flexion WNL, w/ concordant pain  Extension WNL, w/ concordant pain  Right lateral flexion limited by 25%, w/ concordant pain  Left lateral flexion limited by 25%, w/ concordant pain  Right rotation limited by 25%, w/ concordant pain  Left rotation limited by 25%, w/ concordant pain    (Blank rows = not tested)      LUMBAR AROM   AROM AROM  03/04/2022  Flexion WNL  Extension WNL  Right lateral flexion WNL, w/ concordant pain  Left lateral flexion WNL, w/ concordant pain  Right rotation limited by 25%, w/ concordant pain  Left rotation limited by 25%, w/ concordant pain    (Blank rows = not tested   LE MMT:   MMT Right 03/04/2022 Left 03/04/2022  Hip flexion (L2, L3) 4 4  Knee extension (L3) 4+ 4+  Knee flexion 4+ 4+  Hip abduction 3+ 3+  Hip extension      Hip external rotation      Hip internal rotation      Hip adduction      Ankle dorsiflexion (L4)      Ankle plantarflexion (S1) Partial ROM Partial ROM  Ankle inversion      Ankle eversion      Great Toe ext (L5)      Grossly        (Blank rows = not tested, score listed is out of 5 possible points.  N = WNL, D = diminished, C = clear for gross weakness with myotome testing, * = concordant pain with testing)     UPPER EXTREMITY MMT:   MMT Right 03/04/2022 Left 03/04/2022  Shoulder flexion 4+ 4*  Shoulder abduction (C5) 4+ 4*  Shoulder ER 4+ 4*  Shoulder IR 5 5  Middle trapezius 4 4  Lower trapezius      Shoulder extension      Grip strength R 20 kg L 21 kg  Cervical flexion (C1,C2)      Cervical S/B (C3)      Shoulder shrug (C4)      Elbow flexion (C6)      Elbow ext (C7)      Thumb ext (C8)      Finger abd (T1)      Grossly        (Blank rows = not tested, score listed is out of 5 possible points.  N = WNL, D = diminished, C = clear for gross weakness with myotome testing, * = concordant pain with testing)     Functional  Tests   Eval (03/04/2022)      Progressive balance screen (highest level completed for >/= 10''):   Feet together: 10'' Semi Tandem: R in rear 10'', L in rear 10'' Tandem: R in rear 10'', L in rear 10'' SLS: R 5'', L 9''       Supine single leg bridge: L5'', R 5'' partial ROM bil  PALPATION:            TTP costovertebral joints mid and lower thoracic spine (L)    SPINAL SEGMENTAL MOBILITY ASSESSMENT:  See above   PATIENT SURVEYS:  FOTO 44 -> 58  04/08/2022: 65%     TODAY'S TREATMENT  Creating, reviewing, and completing below HEP   PATIENT EDUCATION:  POC, diagnosis, prognosis, HEP, and outcome measures.  Pt educated via explanation, demonstration, and handout (HEP).  Pt confirms understanding verbally.    HOME EXERCISE PROGRAM: Access Code: [email or web address] URL: [email or web address] Date: 03/31/2022 Prepared by: Shearon Balo  Exercises - Shoulder External Rotation and Scapular Retraction with Resistance  - 1 x daily - 7 x weekly - 3 sets - 10 reps - Hooklying Isometric Clamshell  - 1 x daily - 7 x weekly - 3 sets - 10 reps - Staggered Bridge  - 1 x daily - 7 x weekly - 3 sets - 10 reps   ASTERISK SIGNS     Asterisk Signs Eval (03/04/2022) 04/08/2022            SLS <10''  30" on Rt, 13" on Lt           SL bridge L 5'', R 5'' (bil partial ROM)             hip abduction 3+ bil                                            [facility] Adult PT Treatment:                                                DATE: 04/08/2022 Therapeutic Exercise: Bridge isometric with marching while holding 10# kettlebell 3x16 Side knee planks 2x10 BIL Standing BIL shoulder ER with 3# cables 3x12 Kickstand stance Pallof press with 3# cable in hip ER and IR x10 with 5-sec hold BIL Seated low rows with 25# 2x10 Seated high rows with 25# 2x10 Seated lat pull-downs with 25#  2x10 Seated shoulder rolls 2x10 forward and backward Manual Therapy: N/A Neuromuscular re-ed: N/A Therapeutic Activity: Re-assessment of objective measures with pt education Re-administration of FOTO with pt education Modalities: N/A Self Care: N/A    TREATMENT 10/11:  Therapeutic Exercise: - nu-step L6 43mwhile taking subjective and planning session with patient  - horizontal abd with black TB in supine- 3x10 - inhale abd/exhale add (consider ITY next visit)  - supine chest press with + 3x10 @ 3# - pain free arc - bil supine shoulder flexion - 2x10 - 3# - S/L ER (L) - 2x10 - 3# - S/L abd - 2x10 pain free arc 3#  - LTR - 20x - staggered bridge - 3x10 ea - supine clam - black TB - 3x10 - abdominal isometric squeeze - 5'' 2x10 - alternating SLR from foam roller - 2x10  Manual Therapy - STM bil UT and sub occipitals - sub occipital release  TREATMENT 10/9:  Therapeutic Exercise: - nu-step L5 847mhile taking subjective and planning session with patient  - horizontal abd with blue TB in supine- 2x10 - inhale abd/exhale add (consider ITY next visit)  - supine chest press with + 3x10 @ 3# - pain free arc - supine shoulder flexion -  2x10 - 3# - S/L ER (L) - 3x15 - 2# - S/L abd - 2x10 pain free arc 2#  - staggered bridge - 2x10 ea - S/L clam - blue TB - 15x ea - stopped d/t pain - abdominal isometric squeeze - 5'' 2x10  Manual Therapy - STM bil lumbar paraspinals - UPA and CPA lumbar spine G III - AP GH mobs G III     ASSESSMENT:   CLINICAL IMPRESSION: Upon re-assessment of objective measures, the pt has made excellent progress in SLS time and her FOTO score. She tolerated progressed exercises well today and will continue to benefit from skilled PT to address her primary impairments and return to her prior level of function with less limitation.   OBJECTIVE IMPAIRMENTS: Pain, thoracic ROM, LE strength, UE strength, balance   ACTIVITY LIMITATIONS: walking for  long periods, lifting, work, reaching [place]: See medical history and pertinent history       GOALS:     SHORT TERM GOALS: Target date: 03/25/2022   Azzure will be >75% HEP compliant to improve carryover between sessions and facilitate independent management of condition   Evaluation (03/04/2022): ongoing Goal status: ACHIEVED 03/26/22: Pt reports non-compliance due to life stressors 04/08/2022: Pt reports HEP adherence     LONG TERM GOALS: Target date: 04/29/2022   Trayce will improve FOTO score to 58 as a proxy for functional improvement   Evaluation/Baseline (03/04/2022): 48 04/08/2022: 65% Goal status: ACHIEVED     2.  Tauna will be able to stand for >30'' in bil SLS stance, to show a significant improvement in balance in order to reduce fall risk    Evaluation/Baseline (03/04/2022): <10'' bil 04/08/2022: 30" on Rt, 13" on Lt Goal status: IN PROGRESS     3.  Ranay will improve the following MMTs to >/= 4/5 to show improvement in strength    Evaluation/Baseline (03/04/2022):    LE MMT:   MMT Right 03/04/2022 Left 03/04/2022  Hip flexion (L2, L3) 4 4  Knee extension (L3) 4+ 4+  Knee flexion 4+ 4+  Hip abduction 3+ 3+  Hip extension      Hip external rotation      Hip internal rotation      Hip adduction      Ankle dorsiflexion (L4)      Ankle plantarflexion (S1) Partial ROM Partial ROM  Ankle inversion      Ankle eversion      Great Toe ext (L5)      Grossly        (Blank rows = not tested, score listed is out of 5 possible points.  N = WNL, D = diminished, C = clear for gross weakness with myotome testing, * = concordant pain with testing)    Goal status: INITIAL     4.  Stacey will self report >/= 50% decrease in pain from evaluation    Evaluation/Baseline (03/04/2022): 9/10 max pain Goal status: INITIAL     5.  Finn will be able to perform supine single leg bridge as evidence of improved hip extension and core strength    Evaluation/Baseline (03/04/2022): unable full ROM bil Goal status: INITIAL         PLAN: PT FREQUENCY: 1-2x/week   PT DURATION: 8 weeks (Ending 04/29/2022)   PLANNED INTERVENTIONS: Therapeutic exercises, Aquatic therapy, Therapeutic activity, Neuro Muscular re-education, Gait training, Patient/Family education, Joint mobilization, Dry Needling, Electrical stimulation, Spinal mobilization and/or manipulation, Moist heat, Taping, Vasopneumatic device, Ionotophoresis '4mg'$ /ml  Dexamethasone, and Manual therapy   PLAN FOR NEXT SESSION: progressive hip and core strengthening, balance, shoulder girdle and R/C strengthening, manual therapy PRN    Vanessa [place], PT, DPT 04/08/22 4:55 PM

## 2022-04-09 ENCOUNTER — Encounter: Payer: Self-pay | Admitting: Physical Therapy

## 2022-04-09 ENCOUNTER — Ambulatory Visit: Payer: BC Managed Care – PPO | Admitting: Physical Therapy

## 2022-04-09 DIAGNOSIS — M546 Pain in thoracic spine: Secondary | ICD-10-CM | POA: Diagnosis not present

## 2022-04-09 DIAGNOSIS — M25512 Pain in left shoulder: Secondary | ICD-10-CM | POA: Diagnosis not present

## 2022-04-09 DIAGNOSIS — M6281 Muscle weakness (generalized): Secondary | ICD-10-CM

## 2022-04-09 NOTE — Therapy (Signed)
OUTPATIENT PHYSICAL THERAPY TREATMENT NOTE   Patient Name: Candice [name] MRN: [id number] DOB:May 27, 1986, 36 y.o., female 60 Date: 04/09/2022  PCP: Precious Gilding, DO   REFERRING PROVIDER: Glennon Mac, DO   PT End of Session - 04/09/22 1614     Visit Number 8    Date for PT Re-Evaluation 04/29/22    Authorization Type BCBS    PT Start Time 1615    PT Stop Time 1656    PT Time Calculation (min) 41 min    Activity Tolerance Patient tolerated treatment well    Behavior During Therapy [facility] for tasks assessed/performed                Past Medical History:  Diagnosis Date   Complication of anesthesia    epidural with last pregnancy made entire left side numb, had to d/c   Depression    Headache    HSV 06/28/2007   Hx of migraines    Past Surgical History:  Procedure Laterality Date   BREAST SURGERY  2019   reduction   CESAREAN SECTION     CESAREAN SECTION  09/07/2011   Procedure: CESAREAN SECTION;  Surgeon: Florian Buff, MD;  Location: [facility];  Service: Gynecology;  Laterality: N/A;  Repeat cesarean section with delivery of baby boy at 4. Apgars 9/9.  Bilateral tubal ligation.   IUD REMOVAL  2005   in cervix   TUBAL LIGATION     tubal reanastomosis  08/13/2016   Patient Active Problem List   Diagnosis Date Noted   Constipation 02/18/2022   Arthralgia 02/18/2022   Depression, major, single episode, mild ([place]) 01/13/2022   Cyst of vulva 09/19/2021   Frequent urination 09/11/2021   UTI symptoms 07/30/2021   Medication monitoring encounter 01/11/2021   MS (multiple sclerosis) ([place]) 05/21/2020   GERD (gastroesophageal reflux disease) 05/21/2020   Right-sided abdominal pain of unknown etiology 04/06/2020   Abnormal MRI, spine 04/06/2020   Metatarsalgia of right foot 04/06/2020   Healthcare maintenance 02/10/2020   Right leg numbness 02/07/2020   HIV (human immunodeficiency virus infection) ([place]) 09/07/2019   Hemorrhagic cyst of left ovary  10/14/2016   Abdominal pain 04/16/2016   MRSA colonization 09/25/2014   Major depressive disorder, recurrent episode, moderate ([place]) 06/08/2013   Genital herpes 02/16/2013   Back pain 10/13/2011   Vaginal discharge 10/10/2010   Cervical cancer screening 10/10/2010   OBESITY, NOS 08/20/2006    THERAPY DIAG:  Pain in thoracic spine  Muscle weakness  Left shoulder pain, unspecified chronicity  REFERRING DIAG: Chronic bilateral thoracic back pain [M54.6, G89.29], MS (multiple sclerosis) (HCC) [G35]  PERTINENT HISTORY: HIV, MS    PRECAUTIONS/RESTRICTIONS:   MS  SUBJECTIVE:  Pt reports that overall things are going well.  She feels like her back and shoulder pain are improving.  Pain:  Are you having pain? Yes Pain location: L UT region and L shoulder, low back NPRS scale:  current 2-3/10 L shoulder, 0/10 low back Aggravating factors: lifting, sleeping, working Relieving factors: rest Pain description: intermittent, sharp, and aching Stage: Subacute Stability: getting better  OBJECTIVE: (objective measures completed at initial evaluation unless otherwise dated)  DIAGNOSTIC FINDINGS:  MRI CX IMPRESSION: 1. Stable and normal MRI appearance of the cervical spinal cord with no evidence for demyelinating disease or abnormal enhancement. 2. Mild cervical spondylosis at C5-6 with resultant mild to moderate bilateral C6 foraminal stenosis. 3. Additional mild spondylosis elsewhere within the cervical spine without significant stenosis or impingement.   MRI  Tx Spine IMPRESSION: 1. Stable demyelinating lesion involving the right dorsolateral cord at T8-9. No associated enhancement to suggest active demyelination. No new lesions to suggest interval disease progression. 2. Otherwise normal MRI of the thoracic spine.   SENSATION:          Light touch: Appears intact, subjective report of N/T starting in July   MUSCLE LENGTH: Hamstrings: Right no restriction; Left no  restriction ASLR: Right ASLR = PSLR; Left ASLR = PSLR         Thoracic AROM   AROM AROM  03/04/2022  Flexion WNL, w/ concordant pain  Extension WNL, w/ concordant pain  Right lateral flexion limited by 25%, w/ concordant pain  Left lateral flexion limited by 25%, w/ concordant pain  Right rotation limited by 25%, w/ concordant pain  Left rotation limited by 25%, w/ concordant pain    (Blank rows = not tested)      LUMBAR AROM   AROM AROM  03/04/2022  Flexion WNL  Extension WNL  Right lateral flexion WNL, w/ concordant pain  Left lateral flexion WNL, w/ concordant pain  Right rotation limited by 25%, w/ concordant pain  Left rotation limited by 25%, w/ concordant pain    (Blank rows = not tested   LE MMT:   MMT Right 03/04/2022 Left 03/04/2022  Hip flexion (L2, L3) 4 4  Knee extension (L3) 4+ 4+  Knee flexion 4+ 4+  Hip abduction 3+ 3+  Hip extension      Hip external rotation      Hip internal rotation      Hip adduction      Ankle dorsiflexion (L4)      Ankle plantarflexion (S1) Partial ROM Partial ROM  Ankle inversion      Ankle eversion      Great Toe ext (L5)      Grossly        (Blank rows = not tested, score listed is out of 5 possible points.  N = WNL, D = diminished, C = clear for gross weakness with myotome testing, * = concordant pain with testing)     UPPER EXTREMITY MMT:   MMT Right 03/04/2022 Left 03/04/2022  Shoulder flexion 4+ 4*  Shoulder abduction (C5) 4+ 4*  Shoulder ER 4+ 4*  Shoulder IR 5 5  Middle trapezius 4 4  Lower trapezius      Shoulder extension      Grip strength R 20 kg L 21 kg  Cervical flexion (C1,C2)      Cervical S/B (C3)      Shoulder shrug (C4)      Elbow flexion (C6)      Elbow ext (C7)      Thumb ext (C8)      Finger abd (T1)      Grossly        (Blank rows = not tested, score listed is out of 5 possible points.  N = WNL, D = diminished, C = clear for gross weakness with myotome testing, * = concordant pain  with testing)     Functional Tests   Eval (03/04/2022)      Progressive balance screen (highest level completed for >/= 10''):   Feet together: 10'' Semi Tandem: R in rear 10'', L in rear 10'' Tandem: R in rear 10'', L in rear 10'' SLS: R 5'', L 9''       Supine single leg bridge: L5'', R 5'' partial ROM bil  PALPATION:            TTP costovertebral joints mid and lower thoracic spine (L)    SPINAL SEGMENTAL MOBILITY ASSESSMENT:  See above   PATIENT SURVEYS:  FOTO 44 -> 58  04/08/2022: 65%     TODAY'S TREATMENT  Creating, reviewing, and completing below HEP   PATIENT EDUCATION:  POC, diagnosis, prognosis, HEP, and outcome measures.  Pt educated via explanation, demonstration, and handout (HEP).  Pt confirms understanding verbally.    HOME EXERCISE PROGRAM: Access Code: [email or web address] URL: [email or web address] Date: 03/31/2022 Prepared by: Shearon Balo  Exercises - Shoulder External Rotation and Scapular Retraction with Resistance  - 1 x daily - 7 x weekly - 3 sets - 10 reps - Hooklying Isometric Clamshell  - 1 x daily - 7 x weekly - 3 sets - 10 reps - Staggered Bridge  - 1 x daily - 7 x weekly - 3 sets - 10 reps   ASTERISK SIGNS     Asterisk Signs Eval (03/04/2022) 04/08/2022            SLS <10''  30" on Rt, 13" on Lt           SL bridge L 5'', R 5'' (bil partial ROM)             hip abduction 3+ bil                                             TREATMENT 10/18:  Therapeutic Exercise: - nu-step L6 68mwhile taking subjective and planning session with patient - ITYW - 10x ea - LE x-over - 10x ea - figure 4 bridge - 2x10 ea - side plank from knees with clam - 2x10 ea - dead bug - 3x10 - scaption - 2# - at wall - Seated low rows with 25# 2x10 - Seated high rows with 25# 2x10 - Seated lat pull-downs with 25# 2x10 - Seated shoulder rolls  2x10 forward and backward  Manual Therapy - STM bil UT and sub occipitals - sub occipital release    ASSESSMENT:   CLINICAL IMPRESSION: Brenlynn tolerated session well with no adverse reaction.  We are progressing both shoulder and low back exercises.  She shows fatigue but no increase in pain.  Will continue to work on strength of lower traps in next sessions.   OBJECTIVE IMPAIRMENTS: Pain, thoracic ROM, LE strength, UE strength, balance   ACTIVITY LIMITATIONS: walking for long periods, lifting, work, reaching [place] See medical history and pertinent history       GOALS:     SHORT TERM GOALS: Target date: 03/25/2022   SLinsywill be >75% HEP compliant to improve carryover between sessions and facilitate independent management of condition   Evaluation (03/04/2022): ongoing Goal status: ACHIEVED 03/26/22: Pt reports non-compliance due to life stressors 04/08/2022: Pt reports HEP adherence     LONG TERM GOALS: Target date: 04/29/2022   SAinawill improve FOTO score to 58 as a proxy for functional improvement   Evaluation/Baseline (03/04/2022): 48 04/08/2022: 65% Goal status: ACHIEVED     2.  Chitara will be able to stand for >30'' in bil SLS stance, to show a significant improvement in balance in order to reduce fall risk    Evaluation/Baseline (03/04/2022): <10'' bil 04/08/2022: 30" on Rt, 13" on Lt Goal status: IN PROGRESS  3.  Nathali will improve the following MMTs to >/= 4/5 to show improvement in strength    Evaluation/Baseline (03/04/2022):    LE MMT:   MMT Right 03/04/2022 Left 03/04/2022  Hip flexion (L2, L3) 4 4  Knee extension (L3) 4+ 4+  Knee flexion 4+ 4+  Hip abduction 3+ 3+  Hip extension      Hip external rotation      Hip internal rotation      Hip adduction      Ankle dorsiflexion (L4)      Ankle plantarflexion (S1) Partial ROM Partial ROM  Ankle inversion      Ankle eversion      Great Toe ext (L5)      Grossly         (Blank rows = not tested, score listed is out of 5 possible points.  N = WNL, D = diminished, C = clear for gross weakness with myotome testing, * = concordant pain with testing)    Goal status: INITIAL     4.  Jamilyn will self report >/= 50% decrease in pain from evaluation    Evaluation/Baseline (03/04/2022): 9/10 max pain Goal status: INITIAL     5.  [place] will be able to perform supine single leg bridge as evidence of improved hip extension and core strength   Evaluation/Baseline (03/04/2022): unable full ROM bil Goal status: INITIAL         PLAN: PT FREQUENCY: 1-2x/week   PT DURATION: 8 weeks (Ending 04/29/2022)   PLANNED INTERVENTIONS: Therapeutic exercises, Aquatic therapy, Therapeutic activity, Neuro Muscular re-education, Gait training, Patient/Family education, Joint mobilization, Dry Needling, Electrical stimulation, Spinal mobilization and/or manipulation, Moist heat, Taping, Vasopneumatic device, Ionotophoresis '4mg'$ /ml Dexamethasone, and Manual therapy   PLAN FOR NEXT SESSION: progressive hip and core strengthening, balance, shoulder girdle and R/C strengthening, manual therapy PRN    Shearon Balo PT, DPT 04/09/22 4:57 PM 04/09/22 4:57 PM

## 2022-04-11 ENCOUNTER — Other Ambulatory Visit: Payer: Self-pay

## 2022-04-11 ENCOUNTER — Ambulatory Visit (INDEPENDENT_AMBULATORY_CARE_PROVIDER_SITE_OTHER): Payer: BC Managed Care – PPO | Admitting: Pharmacist

## 2022-04-11 DIAGNOSIS — Z21 Asymptomatic human immunodeficiency virus [HIV] infection status: Secondary | ICD-10-CM | POA: Diagnosis not present

## 2022-04-11 MED ORDER — CABOTEGRAVIR & RILPIVIRINE ER 600 & 900 MG/3ML IM SUER
1.0000 | Freq: Once | INTRAMUSCULAR | Status: AC
  Administered 2022-04-11: 1 via INTRAMUSCULAR

## 2022-04-11 NOTE — Progress Notes (Signed)
HPI: Candice [name] is a 36 y.o. female who presents to the [facility] for [place] administration.  Patient Active Problem List   Diagnosis Date Noted   Constipation 02/18/2022   Arthralgia 02/18/2022   Depression, major, single episode, mild ([place]) 01/13/2022   Cyst of vulva 09/19/2021   Frequent urination 09/11/2021   UTI symptoms 07/30/2021   Medication monitoring encounter 01/11/2021   MS (multiple sclerosis) ([place]) 05/21/2020   GERD (gastroesophageal reflux disease) 05/21/2020   Right-sided abdominal pain of unknown etiology 04/06/2020   Abnormal MRI, spine 04/06/2020   Metatarsalgia of right foot 04/06/2020   Healthcare maintenance 02/10/2020   Right leg numbness 02/07/2020   HIV (human immunodeficiency virus infection) ([place]) 09/07/2019   Hemorrhagic cyst of left ovary 10/14/2016   Abdominal pain 04/16/2016   MRSA colonization 09/25/2014   Major depressive disorder, recurrent episode, moderate ([place]) 06/08/2013   Genital herpes 02/16/2013   Back pain 10/13/2011   Vaginal discharge 10/10/2010   Cervical cancer screening 10/10/2010   OBESITY, NOS 08/20/2006    Patient's Medications  New Prescriptions   No medications on file  Previous Medications   CABOTEGRAVIR & RILPIVIRINE ER (CABENUVA) 600 & 900 MG/3ML INJECTION    Inject 1 kit into the muscle every 30 (thirty) days.   CYCLOBENZAPRINE (FLEXERIL) 10 MG TABLET    TAKE 1 TABLET BY MOUTH THREE TIMES A DAY AS NEEDED FOR MUSCLE SPASMS   HYDROXYZINE (ATARAX) 25 MG TABLET    Take 1-2 tablets (25-50 mg total) by mouth 3 (three) times daily as needed.   NAPROXEN SODIUM (ALEVE) 220 MG TABLET    Take 220 mg by mouth daily as needed (For pain).   ONDANSETRON (ZOFRAN) 4 MG TABLET    Take 1 tablet (4 mg total) by mouth every 8 (eight) hours as needed for nausea or vomiting.   OXYCODONE-ACETAMINOPHEN (PERCOCET/ROXICET) 5-325 MG TABLET    Take 1 tablet by mouth every 8 (eight) hours as needed for up to 12 doses for severe  pain.   PSYLLIUM (METAMUCIL) 58.6 % PACKET    Take 1 packet by mouth daily.   SENNA-DOCUSATE (SENOKOT-S) 8.6-50 MG TABLET    Take 1 tablet by mouth daily.   TERIFLUNOMIDE 14 MG TABS    Take 14 mg by mouth daily.   VALACYCLOVIR (VALTREX) 1000 MG TABLET    Take 1 tablet (1,000 mg total) by mouth 2 (two) times daily.  Modified Medications   No medications on file  Discontinued Medications   No medications on file    Allergies: Allergies  Allergen Reactions   Contrast Media [Iodinated Contrast Media] Nausea And Vomiting    Past Medical History: Past Medical History:  Diagnosis Date   Complication of anesthesia    epidural with last pregnancy made entire left side numb, had to d/c   Depression    Headache    HSV 06/28/2007   Hx of migraines     Social History: Social History   Socioeconomic History   Marital status: Single    Spouse name: Not on file   Number of children: 2   Years of education: 14   Highest education level: Not on file  Occupational History    Comment: Ecologist  Tobacco Use   Smoking status: Every Day    Packs/day: 0.25    Types: Cigarettes   Smokeless tobacco: Never   Tobacco comments:    States she has thought about quitting  Vaping Use   Vaping Use:  Never used  Substance and Sexual Activity   Alcohol use: Yes    Comment: occasional   Drug use: Yes    Frequency: 7.0 times per week    Types: Marijuana    Comment: States daily use   Sexual activity: Yes    Partners: Male    Birth control/protection: None    Comment: Decline condoms  Other Topics Concern   Not on file  Social History Narrative   Works as Ecologist.   FOB of both children lives in [place]; will be some what involved but mother is no longer dating him.   Social Determinants of Health   Financial Resource Strain: Not on file  Food Insecurity: Not on file  Transportation Needs: Not on file  Physical Activity: Not on file  Stress: Not on file  Social  Connections: Not on file    Labs: Lab Results  Component Value Date   HIV1RNAQUANT Not Detected 02/14/2022   HIV1RNAQUANT Not Detected 12/13/2021   HIV1RNAQUANT NOT DETECTED 10/15/2021   CD4TABS 1,026 11/22/2020   CD4TABS 1,078 03/29/2020   CD4TABS 1,233 02/10/2020    RPR and STI Lab Results  Component Value Date   LABRPR NON-REACTIVE 12/13/2021   LABRPR NON-REACTIVE 06/20/2021   LABRPR Non Reactive 10/25/2020   LABRPR NON-REACTIVE 09/08/2019   LABRPR Non Reactive 09/05/2019    STI Results GC CT  02/14/2022  9:13 AM Negative    Negative  Negative    Negative   12/13/2021  9:09 AM Negative    Negative  Negative    Negative   07/29/2021  4:28 PM Negative  Negative   06/20/2021  9:20 AM Negative  Negative   12/21/2020  3:45 PM Negative  Negative   11/22/2020  9:58 AM Negative  Negative   10/25/2020  9:52 AM Negative  Negative   09/05/2019  4:31 PM Negative  Negative   10/16/2017 12:00 AM Negative  Negative   06/08/2017 12:00 AM Negative  Negative   10/14/2016 12:00 AM Negative  Negative   02/15/2016 12:00 AM Negative  Negative   07/13/2015 12:00 AM Negative  Negative   04/13/2015 12:00 AM Negative  Negative   09/24/2013 12:00 AM NG: Negative  CT: Negative   02/11/2011  3:36 PM  NEGATIVE   01/29/2010  7:00 PM  NEGATIVE   05/02/2009  8:17 PM  NEGATIVE   09/29/2008  9:16 PM  NEGATIVE   10/12/2007 10:55 PM  NEGATIVE     Hepatitis B Lab Results  Component Value Date   HEPBSAB REACTIVE (A) 09/08/2019   HEPBSAG NON-REACTIVE 01/08/2022   HEPBCAB NON-REACTIVE 09/08/2019   Hepatitis C Lab Results  Component Value Date   HEPCAB NON-REACTIVE 09/08/2019   Hepatitis A Lab Results  Component Value Date   HAV BORDERLINE (A) 09/08/2019   Lipids: Lab Results  Component Value Date   CHOL 183 11/22/2020   TRIG 80 11/22/2020   HDL 51 11/22/2020   CHOLHDL 3.6 11/22/2020   VLDL 10 02/14/2013   LDLCALC 115 (H) 11/22/2020    TARGET DATE: 25th  Assessment: Candice [name]  presents today for her maintenance Cabenuva injections. Past injections were tolerated well without issues. She has remained undetectable, last HIV RNA checked in August. She has no new partners or concerns for STIs and politely declines screening today. Discussed vaccinations she is due for including PCV20, annual flu vaccine, COVID-19 vaccine and hepatitis A. She is not interested in receiving PCV20, flu or COVID-19 vaccines. However, she would like to  receive the hepatitis A vaccine but prefers to receive it at a different time as her Cabenuva injections.   Administered cabotegravir 618m/3mL in left upper outer quadrant of the gluteal muscle. Administered rilpivirine 900 mg/333min the right upper outer quadrant of the gluteal muscle. No issues with injections. She will follow up in 2 months for next set of injections. She will also follow up November 9th for starting hepatitis A vaccination series.  Plan: - Cabenuva injections administered - Appointment scheduled for 05/01/22 with AmEstill Bambergor hepatitis A vaccine  - Next injections scheduled for 06/20/22 with GrBelenda Cruise02/23/24 with [name] Call with any issues or questions  CoEliseo GumPharmD PGY1 Pharmacy Resident   04/11/2022  9:47 AM

## 2022-04-15 ENCOUNTER — Encounter: Payer: BC Managed Care – PPO | Admitting: Physical Therapy

## 2022-04-16 ENCOUNTER — Encounter: Payer: Self-pay | Admitting: Physical Therapy

## 2022-04-16 ENCOUNTER — Ambulatory Visit: Payer: BC Managed Care – PPO | Admitting: Physical Therapy

## 2022-04-16 DIAGNOSIS — M546 Pain in thoracic spine: Secondary | ICD-10-CM | POA: Diagnosis not present

## 2022-04-16 DIAGNOSIS — M6281 Muscle weakness (generalized): Secondary | ICD-10-CM

## 2022-04-16 DIAGNOSIS — M25512 Pain in left shoulder: Secondary | ICD-10-CM

## 2022-04-16 NOTE — Therapy (Signed)
OUTPATIENT PHYSICAL THERAPY TREATMENT NOTE   Patient Name: Candice [name] MRN: [id number] DOB:Jul 19, 1985, 36 y.o., female Today's Date: 04/16/2022  PCP: Precious Gilding, DO   REFERRING PROVIDER: Glennon Mac, DO   PT End of Session - 04/16/22 1613     Visit Number 9    Date for PT Re-Evaluation 06/11/22    Authorization Type BCBS    PT Start Time 1615    PT Stop Time 1656    PT Time Calculation (min) 41 min    Activity Tolerance Patient tolerated treatment well    Behavior During Therapy [facility] for tasks assessed/performed                Past Medical History:  Diagnosis Date   Complication of anesthesia    epidural with last pregnancy made entire left side numb, had to d/c   Depression    Headache    HSV 06/28/2007   Hx of migraines    Past Surgical History:  Procedure Laterality Date   BREAST SURGERY  2019   reduction   CESAREAN SECTION     CESAREAN SECTION  09/07/2011   Procedure: CESAREAN SECTION;  Surgeon: Florian Buff, MD;  Location: [facility];  Service: Gynecology;  Laterality: N/A;  Repeat cesarean section with delivery of baby boy at 35. Apgars 9/9.  Bilateral tubal ligation.   IUD REMOVAL  2005   in cervix   TUBAL LIGATION     tubal reanastomosis  08/13/2016   Patient Active Problem List   Diagnosis Date Noted   Constipation 02/18/2022   Arthralgia 02/18/2022   Depression, major, single episode, mild ([place]) 01/13/2022   Cyst of vulva 09/19/2021   Frequent urination 09/11/2021   UTI symptoms 07/30/2021   Medication monitoring encounter 01/11/2021   MS (multiple sclerosis) ([place]) 05/21/2020   GERD (gastroesophageal reflux disease) 05/21/2020   Right-sided abdominal pain of unknown etiology 04/06/2020   Abnormal MRI, spine 04/06/2020   Metatarsalgia of right foot 04/06/2020   Healthcare maintenance 02/10/2020   Right leg numbness 02/07/2020   HIV (human immunodeficiency virus infection) ([place]) 09/07/2019   Hemorrhagic cyst of left ovary  10/14/2016   Abdominal pain 04/16/2016   MRSA colonization 09/25/2014   Major depressive disorder, recurrent episode, moderate ([place]) 06/08/2013   Genital herpes 02/16/2013   Back pain 10/13/2011   Vaginal discharge 10/10/2010   Cervical cancer screening 10/10/2010   OBESITY, NOS 08/20/2006    THERAPY DIAG:  Pain in thoracic spine - Plan: PT plan of care cert/re-cert  Muscle weakness - Plan: PT plan of care cert/re-cert  Left shoulder pain, unspecified chronicity - Plan: PT plan of care cert/re-cert  REFERRING DIAG: Chronic bilateral thoracic back pain [M54.6, G89.29], MS (multiple sclerosis) ([place]) [G35]  PERTINENT HISTORY: HIV, MS    PRECAUTIONS/RESTRICTIONS:   MS  SUBJECTIVE:  Pt reports that her L shoulder pain is improving.  She has minimal pain today.  Pain:  Are you having pain? Yes Pain location: L UT region and L shoulder, low back NPRS scale:  current 1/10 L shoulder, 0/10 low back Aggravating factors: lifting, sleeping, working Relieving factors: rest Pain description: intermittent, sharp, and aching Stage: Subacute Stability: getting better  OBJECTIVE: (objective measures completed at initial evaluation unless otherwise dated)  DIAGNOSTIC FINDINGS:  MRI CX IMPRESSION: 1. Stable and normal MRI appearance of the cervical spinal cord with no evidence for demyelinating disease or abnormal enhancement. 2. Mild cervical spondylosis at C5-6 with resultant mild to moderate bilateral C6 foraminal stenosis.  3. Additional mild spondylosis elsewhere within the cervical spine without significant stenosis or impingement.   MRI Tx Spine IMPRESSION: 1. Stable demyelinating lesion involving the right dorsolateral cord at T8-9. No associated enhancement to suggest active demyelination. No new lesions to suggest interval disease progression. 2. Otherwise normal MRI of the thoracic spine.   SENSATION:          Light touch: Appears intact, subjective report of N/T  starting in July   MUSCLE LENGTH: Hamstrings: Right no restriction; Left no restriction ASLR: Right ASLR = PSLR; Left ASLR = PSLR         Thoracic AROM   AROM AROM  03/04/2022  Flexion WNL, w/ concordant pain  Extension WNL, w/ concordant pain  Right lateral flexion limited by 25%, w/ concordant pain  Left lateral flexion limited by 25%, w/ concordant pain  Right rotation limited by 25%, w/ concordant pain  Left rotation limited by 25%, w/ concordant pain    (Blank rows = not tested)      LUMBAR AROM   AROM AROM  03/04/2022  Flexion WNL  Extension WNL  Right lateral flexion WNL, w/ concordant pain  Left lateral flexion WNL, w/ concordant pain  Right rotation limited by 25%, w/ concordant pain  Left rotation limited by 25%, w/ concordant pain    (Blank rows = not tested   LE MMT:   MMT Right 03/04/2022 Left 03/04/2022  Hip flexion (L2, L3) 4 4  Knee extension (L3) 4+ 4+  Knee flexion 4+ 4+  Hip abduction 3+ 3+  Hip extension      Hip external rotation      Hip internal rotation      Hip adduction      Ankle dorsiflexion (L4)      Ankle plantarflexion (S1) Partial ROM Partial ROM  Ankle inversion      Ankle eversion      Great Toe ext (L5)      Grossly        (Blank rows = not tested, score listed is out of 5 possible points.  N = WNL, D = diminished, C = clear for gross weakness with myotome testing, * = concordant pain with testing)     UPPER EXTREMITY MMT:   MMT Right 03/04/2022 Left 03/04/2022  Shoulder flexion 4+ 4*  Shoulder abduction (C5) 4+ 4*  Shoulder ER 4+ 4*  Shoulder IR 5 5  Middle trapezius 4 4  Lower trapezius      Shoulder extension      Grip strength R 20 kg L 21 kg  Cervical flexion (C1,C2)      Cervical S/B (C3)      Shoulder shrug (C4)      Elbow flexion (C6)      Elbow ext (C7)      Thumb ext (C8)      Finger abd (T1)      Grossly        (Blank rows = not tested, score listed is out of 5 possible points.  N = WNL, D =  diminished, C = clear for gross weakness with myotome testing, * = concordant pain with testing)     Functional Tests   Eval (03/04/2022)      Progressive balance screen (highest level completed for >/= 10''):   Feet together: 10'' Semi Tandem: R in rear 10'', L in rear 10'' Tandem: R in rear 10'', L in rear 10'' SLS: R 5'', L 9''  Supine single leg bridge: L5'', R 5'' partial ROM bil                                                                                                 PALPATION:            TTP costovertebral joints mid and lower thoracic spine (L)    SPINAL SEGMENTAL MOBILITY ASSESSMENT:  See above   PATIENT SURVEYS:  FOTO 44 -> 58  04/08/2022: 65%     TODAY'S TREATMENT  Creating, reviewing, and completing below HEP   PATIENT EDUCATION:  POC, diagnosis, prognosis, HEP, and outcome measures.  Pt educated via explanation, demonstration, and handout (HEP).  Pt confirms understanding verbally.    HOME EXERCISE PROGRAM: Access Code: [email or web address] URL: [email or web address] Date: 03/31/2022 Prepared by: Shearon Balo  Exercises - Shoulder External Rotation and Scapular Retraction with Resistance  - 1 x daily - 7 x weekly - 3 sets - 10 reps - Hooklying Isometric Clamshell  - 1 x daily - 7 x weekly - 3 sets - 10 reps - Staggered Bridge  - 1 x daily - 7 x weekly - 3 sets - 10 reps   ASTERISK SIGNS     Asterisk Signs Eval (03/04/2022) 04/08/2022            SLS <10''  30" on Rt, 13" on Lt           SL bridge L 5'', R 5'' (bil partial ROM)             hip abduction 3+ bil                                             TREATMENT 10/25:  Therapeutic Exercise: - nu-step L7 3m while taking subjective and planning session with patient - TY - 15x ea - paloff press - 2x10 - 10# - downward chop - 2x10 ea - 13# - side plank from knees with clam - 2x10 ea - scaption - 3# - at wall - 2x15 - Seated low rows with 25# 2x15 - Seated high rows  with 25# 2x15 - Seated lat pull-downs with 25# 2x15  Manual Therapy - STM bil UT and sub occipitals - sub occipital release    ASSESSMENT:   CLINICAL IMPRESSION: Rmoni tolerated session well with no adverse reaction.  Pt reports less baseline pain and improved ability to move objects/lift at work.  Will continue current progression.  Extending POC with reduced frequency to 1x/week.   OBJECTIVE IMPAIRMENTS: Pain, thoracic ROM, LE strength, UE strength, balance   ACTIVITY LIMITATIONS: walking for long periods, lifting, work, reaching [place]: See medical history and pertinent history       GOALS:     SHORT TERM GOALS: Target date: 03/25/2022   Claribel will be >75% HEP compliant to improve carryover between sessions and facilitate independent management of condition   Evaluation (03/04/2022): ongoing Goal status: ACHIEVED 03/26/22: Pt reports non-compliance due to life  stressors 04/08/2022: Pt reports HEP adherence     LONG TERM GOALS: Target date: 04/29/2022 (extended to 12/20)   Ahlayah will improve FOTO score to 58 as a proxy for functional improvement   Evaluation/Baseline (03/04/2022): 48 04/08/2022: 65% Goal status: ACHIEVED     2.  Lindzey will be able to stand for >30'' in bil SLS stance, to show a significant improvement in balance in order to reduce fall risk    Evaluation/Baseline (03/04/2022): <10'' bil 04/08/2022: 30" on Rt, 13" on Lt Goal status: IN PROGRESS     3.  Quorra will improve the following MMTs to >/= 4/5 to show improvement in strength    Evaluation/Baseline (03/04/2022):    LE MMT:   MMT Right 03/04/2022 Left 03/04/2022  Hip flexion (L2, L3) 4 4  Knee extension (L3) 4+ 4+  Knee flexion 4+ 4+  Hip abduction 3+ 3+  Hip extension      Hip external rotation      Hip internal rotation      Hip adduction      Ankle dorsiflexion (L4)      Ankle plantarflexion (S1) Partial ROM Partial ROM  Ankle inversion      Ankle  eversion      Great Toe ext (L5)      Grossly        (Blank rows = not tested, score listed is out of 5 possible points.  N = WNL, D = diminished, C = clear for gross weakness with myotome testing, * = concordant pain with testing)    Goal status: INITIAL     4.  Keniesha will self report >/= 50% decrease in pain from evaluation    Evaluation/Baseline (03/04/2022): 9/10 max pain 10/25: 2/10 Goal status: MET     5.  Zakayla will be able to perform supine single leg bridge as evidence of improved hip extension and core strength   Evaluation/Baseline (03/04/2022): unable full ROM bil 10/25: MET Goal status: MET         PLAN: PT FREQUENCY: 1-2x/week   PT DURATION: 8 weeks (Ending 12/20)   PLANNED INTERVENTIONS: Therapeutic exercises, Aquatic therapy, Therapeutic activity, Neuro Muscular re-education, Gait training, Patient/Family education, Joint mobilization, Dry Needling, Electrical stimulation, Spinal mobilization and/or manipulation, Moist heat, Taping, Vasopneumatic device, Ionotophoresis 4mg /ml Dexamethasone, and Manual therapy   PLAN FOR NEXT SESSION: progressive hip and core strengthening, balance, shoulder girdle and R/C strengthening, manual therapy PRN    Shearon Balo PT, DPT 04/16/22 4:58 PM

## 2022-04-30 NOTE — Progress Notes (Signed)
Date:  04/30/2022   HPI: Candice [name] is a 36 y.o. female who presents to the [facility] for a Hepatitis A vaccine.  Insured   _0    Uninsured  _1    Patient Active Problem List   Diagnosis Date Noted   Constipation 02/18/2022   Arthralgia 02/18/2022   Depression, major, single episode, mild ([place]) 01/13/2022   Cyst of vulva 09/19/2021   Frequent urination 09/11/2021   UTI symptoms 07/30/2021   Medication monitoring encounter 01/11/2021   MS (multiple sclerosis) ([place]) 05/21/2020   GERD (gastroesophageal reflux disease) 05/21/2020   Right-sided abdominal pain of unknown etiology 04/06/2020   Abnormal MRI, spine 04/06/2020   Metatarsalgia of right foot 04/06/2020   Healthcare maintenance 02/10/2020   Right leg numbness 02/07/2020   HIV (human immunodeficiency virus infection) ([place]) 09/07/2019   Hemorrhagic cyst of left ovary 10/14/2016   Abdominal pain 04/16/2016   MRSA colonization 09/25/2014   Major depressive disorder, recurrent episode, moderate ([place]) 06/08/2013   Genital herpes 02/16/2013   Back pain 10/13/2011   Vaginal discharge 10/10/2010   Cervical cancer screening 10/10/2010   OBESITY, NOS 08/20/2006    Patient's Medications  New Prescriptions   No medications on file  Previous Medications   CABOTEGRAVIR & RILPIVIRINE ER (CABENUVA) 600 & 900 MG/3ML INJECTION    Inject 1 kit into the muscle every 30 (thirty) days.   CYCLOBENZAPRINE (FLEXERIL) 10 MG TABLET    TAKE 1 TABLET BY MOUTH THREE TIMES A DAY AS NEEDED FOR MUSCLE SPASMS   HYDROXYZINE (ATARAX) 25 MG TABLET    Take 1-2 tablets (25-50 mg total) by mouth 3 (three) times daily as needed.   NAPROXEN SODIUM (ALEVE) 220 MG TABLET    Take 220 mg by mouth daily as needed (For pain).   ONDANSETRON (ZOFRAN) 4 MG TABLET    Take 1 tablet (4 mg total) by mouth every 8 (eight) hours as needed for nausea or vomiting.   OXYCODONE-ACETAMINOPHEN (PERCOCET/ROXICET) 5-325 MG TABLET    Take 1 tablet by mouth every 8  (eight) hours as needed for up to 12 doses for severe pain.   PSYLLIUM (METAMUCIL) 58.6 % PACKET    Take 1 packet by mouth daily.   SENNA-DOCUSATE (SENOKOT-S) 8.6-50 MG TABLET    Take 1 tablet by mouth daily.   TERIFLUNOMIDE 14 MG TABS    Take 14 mg by mouth daily.   VALACYCLOVIR (VALTREX) 1000 MG TABLET    Take 1 tablet (1,000 mg total) by mouth 2 (two) times daily.  Modified Medications   No medications on file  Discontinued Medications   No medications on file    Allergies: Allergies  Allergen Reactions   Contrast Media [Iodinated Contrast Media] Nausea And Vomiting    Past Medical History: Past Medical History:  Diagnosis Date   Complication of anesthesia    epidural with last pregnancy made entire left side numb, had to d/c   Depression    Headache    HSV 06/28/2007   Hx of migraines     Social History: Social History   Socioeconomic History   Marital status: Single    Spouse name: Not on file   Number of children: 2   Years of education: 14   Highest education level: Not on file  Occupational History    Comment: Ecologist  Tobacco Use   Smoking status: Every Day    Packs/day: 0.25    Types: Cigarettes   Smokeless tobacco: Never  Tobacco comments:    States she has thought about quitting  Vaping Use   Vaping Use: Never used  Substance and Sexual Activity   Alcohol use: Yes    Comment: occasional   Drug use: Yes    Frequency: 7.0 times per week    Types: Marijuana    Comment: States daily use   Sexual activity: Yes    Partners: Male    Birth control/protection: None    Comment: Decline condoms  Other Topics Concern   Not on file  Social History Narrative   Works as Ecologist.   FOB of both children lives in [place]; will be some what involved but mother is no longer dating him.   Social Determinants of Health   Financial Resource Strain: Not on file  Food Insecurity: Not on file  Transportation Needs: Not on file  Physical Activity:  Not on file  Stress: Not on file  Social Connections: Not on file        No data to display          Labs:  SCr: Lab Results  Component Value Date   CREATININE 0.60 01/08/2022   CREATININE 0.67 12/13/2021   CREATININE 0.55 08/19/2021   CREATININE 0.59 12/21/2020   CREATININE 0.98 11/22/2020   HIV Lab Results  Component Value Date   HIV REPEATEDLY REACTIVE (A) 09/08/2019   HIV Reactive (A) 09/05/2019   HIV Non Reactive 10/16/2017   HIV Non Reactive 02/15/2016   HIV NONREACTIVE 07/13/2015   Hepatitis B Lab Results  Component Value Date   HEPBSAB REACTIVE (A) 09/08/2019   HEPBSAG NON-REACTIVE 01/08/2022   HEPBCAB NON-REACTIVE 09/08/2019   Hepatitis C Lab Results  Component Value Date   HEPCAB NON-REACTIVE 09/08/2019   Hepatitis A Lab Results  Component Value Date   HAV BORDERLINE (A) 09/08/2019   RPR and STI Lab Results  Component Value Date   LABRPR NON-REACTIVE 12/13/2021   LABRPR NON-REACTIVE 06/20/2021   LABRPR Non Reactive 10/25/2020   LABRPR NON-REACTIVE 09/08/2019   LABRPR Non Reactive 09/05/2019    STI Results GC CT  02/14/2022  9:13 AM Negative    Negative  Negative    Negative   12/13/2021  9:09 AM Negative    Negative  Negative    Negative   07/29/2021  4:28 PM Negative  Negative   06/20/2021  9:20 AM Negative  Negative   12/21/2020  3:45 PM Negative  Negative   11/22/2020  9:58 AM Negative  Negative   10/25/2020  9:52 AM Negative  Negative   09/05/2019  4:31 PM Negative  Negative   10/16/2017 12:00 AM Negative  Negative   06/08/2017 12:00 AM Negative  Negative   10/14/2016 12:00 AM Negative  Negative   02/15/2016 12:00 AM Negative  Negative   07/13/2015 12:00 AM Negative  Negative   04/13/2015 12:00 AM Negative  Negative   09/24/2013 12:00 AM NG: Negative  CT: Negative   02/11/2011  3:36 PM  NEGATIVE   01/29/2010  7:00 PM  NEGATIVE   05/02/2009  8:17 PM  NEGATIVE   09/29/2008  9:16 PM  NEGATIVE   10/12/2007 10:55 PM  NEGATIVE      Assessment: Yasamin Karel presents today to receive a Hepatitis A vaccine. She is also receiving Cabenuva treatment with her next injection appointment on 06/20/2022. She reports no soreness with her last Cabenuva injections.  She asked about [employer] she saw on TikTok. She wanted to see it would  interact with any of her medications. We checked [employer] and found the potential for castor oil to increase the levels of Cabenuva, but it hasn't been studied so it is a theoretical interaction. It had no reactions with any of her other meds. We informed the patient of this as well as how herbal supplements are not FDA regulated as drugs are. We cautioned her of the potential reaction, but explained it was not studied. She was receptive to this counseling.  Patient is also eligible for the flu and COVID vaccines and these were offered to her. She declined them at this time. She was given the Hepatitis A vaccine in her right deltoid.  Plan: - Administration of Hepatitis A Vaccine - Follow-up 06/20/2022 for next maintenance injections of Cabenuva with [facility], [facility]

## 2022-05-01 ENCOUNTER — Other Ambulatory Visit: Payer: Self-pay

## 2022-05-01 ENCOUNTER — Ambulatory Visit (INDEPENDENT_AMBULATORY_CARE_PROVIDER_SITE_OTHER): Payer: BC Managed Care – PPO | Admitting: Pharmacist

## 2022-05-01 DIAGNOSIS — Z23 Encounter for immunization: Secondary | ICD-10-CM

## 2022-05-02 NOTE — Progress Notes (Unsigned)
Candice [name] D.[place] Phone: [phone]   Assessment and Plan:     There are no diagnoses linked to this encounter.  *** - Patient has received significant relief with OMT in the past.  Elects for repeat OMT today.  Tolerated well per note below. - Decision today to treat with OMT was based on Physical Exam   After verbal consent patient was treated with HVLA (high velocity low amplitude), ME (muscle energy), FPR (flex positional release), ST (soft tissue), PC/PD (Pelvic Compression/ Pelvic Decompression) techniques in cervical, rib, thoracic, lumbar, and pelvic areas. Patient tolerated the procedure well with improvement in symptoms.  Patient educated on potential side effects of soreness and recommended to rest, hydrate, and use Tylenol as needed for pain control.   Pertinent previous records reviewed include ***   Follow Up: ***     Subjective:   I, Arlin Savona, am serving as a Education administrator for Doctor Glennon Mac   Chief Complaint: thoracic back pain    HPI:    02/17/2022 Patient is a 36 year old female complaining of thoracic back pain. Patient states  Beginning around 2018, she began having intermittent episodes of numbness, weakness and dizziness.  In May or June of that year, she began having a recurrence of symptoms, including right flank pain and numbness radiating down her right leg with some associated right leg weakness.  She felt off balance.  She saw neurology at that time.  MRI of thoracic spine revealed subtle enhancing hyperintense focus at the T8-T9 region.would like to discuss OMT    03/10/2022 Patient states that she is alright, left side back is still tight and shoulders    04/07/2022 Patient states that she is okay left side is still crazy even with PT  05/05/2022 Patient states    Relevant Historical Information: Multiple sclerosis  Additional pertinent review of systems  negative.  Current Outpatient Medications  Medication Sig Dispense Refill   cabotegravir & rilpivirine ER (CABENUVA) 600 & 900 MG/3ML injection Inject 1 kit into the muscle every 30 (thirty) days. 6 mL 1   cyclobenzaprine (FLEXERIL) 10 MG tablet TAKE 1 TABLET BY MOUTH THREE TIMES A DAY AS NEEDED FOR MUSCLE SPASMS 30 tablet 2   hydrOXYzine (ATARAX) 25 MG tablet Take 1-2 tablets (25-50 mg total) by mouth 3 (three) times daily as needed. 30 tablet 0   naproxen sodium (ALEVE) 220 MG tablet Take 220 mg by mouth daily as needed (For pain).     ondansetron (ZOFRAN) 4 MG tablet Take 1 tablet (4 mg total) by mouth every 8 (eight) hours as needed for nausea or vomiting. 20 tablet 0   oxyCODONE-acetaminophen (PERCOCET/ROXICET) 5-325 MG tablet Take 1 tablet by mouth every 8 (eight) hours as needed for up to 12 doses for severe pain. 12 tablet 0   psyllium (METAMUCIL) 58.6 % packet Take 1 packet by mouth daily.     senna-docusate (SENOKOT-S) 8.6-50 MG tablet Take 1 tablet by mouth daily. 14 tablet 0   Teriflunomide 14 MG TABS Take 14 mg by mouth daily. 30 tablet 11   valACYclovir (VALTREX) 1000 MG tablet Take 1 tablet (1,000 mg total) by mouth 2 (two) times daily. 10 tablet 2   No current facility-administered medications for this visit.      Objective:     There were no vitals filed for this visit.    There is no height or weight on file to calculate  BMI.    Physical Exam:     General: Well-appearing, cooperative, sitting comfortably in no acute distress.   OMT Physical Exam:  ASIS Compression Test: Positive Right Cervical: TTP paraspinal, *** Rib: Bilateral elevated first rib with TTP Thoracic: TTP paraspinal,*** Lumbar: TTP paraspinal,*** Pelvis: Right anterior innominate  Electronically signed by:  Candice [name] D.[name] 11:43 AM 05/02/22

## 2022-05-05 ENCOUNTER — Ambulatory Visit (INDEPENDENT_AMBULATORY_CARE_PROVIDER_SITE_OTHER): Payer: BC Managed Care – PPO | Admitting: Sports Medicine

## 2022-05-05 VITALS — HR 93 | Ht 65.0 in | Wt 190.0 lb

## 2022-05-05 DIAGNOSIS — G8929 Other chronic pain: Secondary | ICD-10-CM

## 2022-05-05 DIAGNOSIS — M9902 Segmental and somatic dysfunction of thoracic region: Secondary | ICD-10-CM | POA: Diagnosis not present

## 2022-05-05 DIAGNOSIS — M9903 Segmental and somatic dysfunction of lumbar region: Secondary | ICD-10-CM

## 2022-05-05 DIAGNOSIS — M9905 Segmental and somatic dysfunction of pelvic region: Secondary | ICD-10-CM | POA: Diagnosis not present

## 2022-05-05 DIAGNOSIS — M9908 Segmental and somatic dysfunction of rib cage: Secondary | ICD-10-CM

## 2022-05-05 DIAGNOSIS — M9901 Segmental and somatic dysfunction of cervical region: Secondary | ICD-10-CM | POA: Diagnosis not present

## 2022-05-05 DIAGNOSIS — M546 Pain in thoracic spine: Secondary | ICD-10-CM | POA: Diagnosis not present

## 2022-05-05 DIAGNOSIS — G35 Multiple sclerosis: Secondary | ICD-10-CM

## 2022-05-05 DIAGNOSIS — G35D Multiple sclerosis, unspecified: Secondary | ICD-10-CM

## 2022-05-05 NOTE — Patient Instructions (Addendum)
Good to see you  4 week OMT follow up

## 2022-05-07 ENCOUNTER — Ambulatory Visit: Payer: BC Managed Care – PPO | Admitting: Physical Therapy

## 2022-05-21 ENCOUNTER — Encounter: Payer: Self-pay | Admitting: Physical Therapy

## 2022-05-21 ENCOUNTER — Ambulatory Visit: Payer: BC Managed Care – PPO | Attending: Sports Medicine | Admitting: Physical Therapy

## 2022-05-21 DIAGNOSIS — M546 Pain in thoracic spine: Secondary | ICD-10-CM | POA: Insufficient documentation

## 2022-05-21 DIAGNOSIS — M6281 Muscle weakness (generalized): Secondary | ICD-10-CM | POA: Insufficient documentation

## 2022-05-21 DIAGNOSIS — M25512 Pain in left shoulder: Secondary | ICD-10-CM | POA: Diagnosis not present

## 2022-05-21 NOTE — Therapy (Signed)
OUTPATIENT PHYSICAL THERAPY TREATMENT NOTE   Patient Name: Candice [name] MRN: [id number] DOB:[birth year], 36 y.o., female Today's Date: 05/21/2022  PCP: Precious Gilding, DO   REFERRING PROVIDER: Glennon Mac, DO   PT End of Session - 05/21/22 1614     Visit Number 10    Date for PT Re-Evaluation 06/11/22    Authorization Type BCBS    PT Start Time 1615    PT Stop Time 1656    PT Time Calculation (min) 41 min    Activity Tolerance Patient tolerated treatment well    Behavior During Therapy [facility] for tasks assessed/performed                Past Medical History:  Diagnosis Date   Complication of anesthesia    epidural with last pregnancy made entire left side numb, had to d/c   Depression    Headache    HSV 06/28/2007   Hx of migraines    Past Surgical History:  Procedure Laterality Date   BREAST SURGERY  2019   reduction   CESAREAN SECTION     CESAREAN SECTION  09/07/2011   Procedure: CESAREAN SECTION;  Surgeon: Florian Buff, MD;  Location: [facility];  Service: Gynecology;  Laterality: N/A;  Repeat cesarean section with delivery of baby boy at 75. Apgars 9/9.  Bilateral tubal ligation.   IUD REMOVAL  2005   in cervix   TUBAL LIGATION     tubal reanastomosis  08/13/2016   Patient Active Problem List   Diagnosis Date Noted   Constipation 02/18/2022   Arthralgia 02/18/2022   Depression, major, single episode, mild ([place]) 01/13/2022   Cyst of vulva 09/19/2021   Frequent urination 09/11/2021   UTI symptoms 07/30/2021   Medication monitoring encounter 01/11/2021   MS (multiple sclerosis) ([place]) 05/21/2020   GERD (gastroesophageal reflux disease) 05/21/2020   Right-sided abdominal pain of unknown etiology 04/06/2020   Abnormal MRI, spine 04/06/2020   Metatarsalgia of right foot 04/06/2020   Healthcare maintenance 02/10/2020   Right leg numbness 02/07/2020   HIV (human immunodeficiency virus infection) ([place]) 09/07/2019   Hemorrhagic cyst of left ovary  10/14/2016   Abdominal pain 04/16/2016   MRSA colonization 09/25/2014   Major depressive disorder, recurrent episode, moderate ([place]) 06/08/2013   Genital herpes 02/16/2013   Back pain 10/13/2011   Vaginal discharge 10/10/2010   Cervical cancer screening 10/10/2010   OBESITY, NOS 08/20/2006    THERAPY DIAG:  Pain in thoracic spine  Muscle weakness  Left shoulder pain, unspecified chronicity  REFERRING DIAG: Chronic bilateral thoracic back pain [M54.6, G89.29], MS (multiple sclerosis) ([facility]) [G35]  PERTINENT HISTORY: HIV, MS    PRECAUTIONS/RESTRICTIONS:   MS  SUBJECTIVE:  Pt reports that her back is doing very well, but she feels like her L side is getting weaker again.  She has had increased pain in her L shoulder.  Pain:  Are you having pain? Yes Pain location: L UT region and L shoulder, low back NPRS scale:  current 7/10 L shoulder, 0/10 low back Aggravating factors: lifting, sleeping, working Relieving factors: rest Pain description: intermittent, sharp, and aching Stage: Subacute Stability: getting better  OBJECTIVE: (objective measures completed at initial evaluation unless otherwise dated)  DIAGNOSTIC FINDINGS:  MRI CX IMPRESSION: 1. Stable and normal MRI appearance of the cervical spinal cord with no evidence for demyelinating disease or abnormal enhancement. 2. Mild cervical spondylosis at C5-6 with resultant mild to moderate bilateral C6 foraminal stenosis. 3. Additional mild spondylosis elsewhere within  the cervical spine without significant stenosis or impingement.   MRI Tx Spine IMPRESSION: 1. Stable demyelinating lesion involving the right dorsolateral cord at T8-9. No associated enhancement to suggest active demyelination. No new lesions to suggest interval disease progression. 2. Otherwise normal MRI of the thoracic spine.   SENSATION:          Light touch: Appears intact, subjective report of N/T starting in July   MUSCLE  LENGTH: Hamstrings: Right no restriction; Left no restriction ASLR: Right ASLR = PSLR; Left ASLR = PSLR         Thoracic AROM   AROM AROM  03/04/2022  Flexion WNL, w/ concordant pain  Extension WNL, w/ concordant pain  Right lateral flexion limited by 25%, w/ concordant pain  Left lateral flexion limited by 25%, w/ concordant pain  Right rotation limited by 25%, w/ concordant pain  Left rotation limited by 25%, w/ concordant pain    (Blank rows = not tested)      LUMBAR AROM   AROM AROM  03/04/2022  Flexion WNL  Extension WNL  Right lateral flexion WNL, w/ concordant pain  Left lateral flexion WNL, w/ concordant pain  Right rotation limited by 25%, w/ concordant pain  Left rotation limited by 25%, w/ concordant pain    (Blank rows = not tested   LE MMT:   MMT Right 03/04/2022 Left 03/04/2022  Hip flexion (L2, L3) 4 4  Knee extension (L3) 4+ 4+  Knee flexion 4+ 4+  Hip abduction 3+ 3+  Hip extension      Hip external rotation      Hip internal rotation      Hip adduction      Ankle dorsiflexion (L4)      Ankle plantarflexion (S1) Partial ROM Partial ROM  Ankle inversion      Ankle eversion      Great Toe ext (L5)      Grossly        (Blank rows = not tested, score listed is out of 5 possible points.  N = WNL, D = diminished, C = clear for gross weakness with myotome testing, * = concordant pain with testing)     UPPER EXTREMITY MMT:   MMT Right 03/04/2022 Left 03/04/2022  Shoulder flexion 4+ 4*  Shoulder abduction (C5) 4+ 4*  Shoulder ER 4+ 4*  Shoulder IR 5 5  Middle trapezius 4 4  Lower trapezius      Shoulder extension      Grip strength R 20 kg L 21 kg  Cervical flexion (C1,C2)      Cervical S/B (C3)      Shoulder shrug (C4)      Elbow flexion (C6)      Elbow ext (C7)      Thumb ext (C8)      Finger abd (T1)      Grossly        (Blank rows = not tested, score listed is out of 5 possible points.  N = WNL, D = diminished, C = clear for gross  weakness with myotome testing, * = concordant pain with testing)     Functional Tests   Eval (03/04/2022)      Progressive balance screen (highest level completed for >/= 10''):   Feet together: 10'' Semi Tandem: R in rear 10'', L in rear 10'' Tandem: R in rear 10'', L in rear 10'' SLS: R 5'', L 9''       Supine single leg  bridge: L5'', R 5'' partial ROM bil                                                                                                 PALPATION:            TTP costovertebral joints mid and lower thoracic spine (L)    SPINAL SEGMENTAL MOBILITY ASSESSMENT:  See above   PATIENT SURVEYS:  FOTO 44 -> 58  04/08/2022: 65%     TODAY'S TREATMENT  Creating, reviewing, and completing below HEP   PATIENT EDUCATION:  POC, diagnosis, prognosis, HEP, and outcome measures.  Pt educated via explanation, demonstration, and handout (HEP).  Pt confirms understanding verbally.    HOME EXERCISE PROGRAM: Access Code: [email or web address] URL: [email or web address] Date: 03/31/2022 Prepared by: Shearon Balo  Exercises - Shoulder External Rotation and Scapular Retraction with Resistance  - 1 x daily - 7 x weekly - 3 sets - 10 reps - Hooklying Isometric Clamshell  - 1 x daily - 7 x weekly - 3 sets - 10 reps - Staggered Bridge  - 1 x daily - 7 x weekly - 3 sets - 10 reps   ASTERISK SIGNS     Asterisk Signs Eval (03/04/2022) 04/08/2022            SLS <10''  30" on Rt, 13" on Lt           SL bridge L 5'', R 5'' (bil partial ROM)             hip abduction 3+ bil                                             TREATMENT 11/29:  Therapeutic Exercise: - nu-step L7 66mwhile taking subjective and planning session with patient - TY - 15x ea (NT) - paloff press - 2x10 - 10# - downward chop - 2x10 ea - 10# (d/c d/t pain on L) - Shoulder adduction - GTB - 3x10  - ext - 3x10 - scaption - 0# - at wall to 90# - 2x15 - Seated low rows with 20# 2x15 - Seated high  rows with 10# 2x15 - Seated lat pull-downs with 25# 2x15 (NT)  Manual Therapy - STM L sided UT, LS and sub occipitals - sub occipital release    ASSESSMENT:   CLINICAL IMPRESSION: Gabrille tolerated session well with no adverse reaction.  Pt with significantly higher baseline pain today after absence from PT.  Concentrated on basic strengthening and pain reduction with manual therapy.  Pt reports significant reduction in pain following manual therapy.  Several exercises (see above) limited d/t pain.  Intensity and exercises modified appropriately to avoid excessive pain.   OBJECTIVE IMPAIRMENTS: Pain, thoracic ROM, LE strength, UE strength, balance   ACTIVITY LIMITATIONS: walking for long periods, lifting, work, reaching [place] See medical history and pertinent history       GOALS:     SHORT TERM GOALS: Target date: 03/25/2022  Pete will be >75% HEP compliant to improve carryover between sessions and facilitate independent management of condition   Evaluation (03/04/2022): ongoing Goal status: ACHIEVED 03/26/22: Pt reports non-compliance due to life stressors 04/08/2022: Pt reports HEP adherence     LONG TERM GOALS: Target date: 04/29/2022 (extended to 12/20)   Leahmarie will improve FOTO score to 58 as a proxy for functional improvement   Evaluation/Baseline (03/04/2022): 48 04/08/2022: 65% Goal status: ACHIEVED     2.  Jacyln will be able to stand for >30'' in bil SLS stance, to show a significant improvement in balance in order to reduce fall risk    Evaluation/Baseline (03/04/2022): <10'' bil 04/08/2022: 30" on Rt, 13" on Lt Goal status: IN PROGRESS     3.  Kristan will improve the following MMTs to >/= 4/5 to show improvement in strength    Evaluation/Baseline (03/04/2022):    LE MMT:   MMT Right 03/04/2022 Left 03/04/2022  Hip flexion (L2, L3) 4 4  Knee extension (L3) 4+ 4+  Knee flexion 4+ 4+  Hip abduction 3+ 3+  Hip extension       Hip external rotation      Hip internal rotation      Hip adduction      Ankle dorsiflexion (L4)      Ankle plantarflexion (S1) Partial ROM Partial ROM  Ankle inversion      Ankle eversion      Great Toe ext (L5)      Grossly        (Blank rows = not tested, score listed is out of 5 possible points.  N = WNL, D = diminished, C = clear for gross weakness with myotome testing, * = concordant pain with testing)    Goal status: INITIAL     4.  Maribell will self report >/= 50% decrease in pain from evaluation    Evaluation/Baseline (03/04/2022): 9/10 max pain 10/25: 2/10 Goal status: MET     5.  Zadaya will be able to perform supine single leg bridge as evidence of improved hip extension and core strength   Evaluation/Baseline (03/04/2022): unable full ROM bil 10/25: MET Goal status: MET         PLAN: PT FREQUENCY: 1-2x/week   PT DURATION: 8 weeks (Ending 12/20)   PLANNED INTERVENTIONS: Therapeutic exercises, Aquatic therapy, Therapeutic activity, Neuro Muscular re-education, Gait training, Patient/Family education, Joint mobilization, Dry Needling, Electrical stimulation, Spinal mobilization and/or manipulation, Moist heat, Taping, Vasopneumatic device, Ionotophoresis 34m/ml Dexamethasone, and Manual therapy   PLAN FOR NEXT SESSION: progressive hip and core strengthening, balance, shoulder girdle and R/C strengthening, manual therapy PRN    KShearon BaloPT, DPT 05/21/22 4:58 PM

## 2022-05-28 ENCOUNTER — Ambulatory Visit: Payer: BC Managed Care – PPO | Admitting: Physical Therapy

## 2022-05-30 NOTE — Progress Notes (Signed)
Candice [name] D.[place] Phone: [phone]   Assessment and Plan:     There are no diagnoses linked to this encounter.  *** - Patient has received significant relief with OMT in the past.  Elects for repeat OMT today.  Tolerated well per note below. - Decision today to treat with OMT was based on Physical Exam   After verbal consent patient was treated with HVLA (high velocity low amplitude), ME (muscle energy), FPR (flex positional release), ST (soft tissue), PC/PD (Pelvic Compression/ Pelvic Decompression) techniques in cervical, rib, thoracic, lumbar, and pelvic areas. Patient tolerated the procedure well with improvement in symptoms.  Patient educated on potential side effects of soreness and recommended to rest, hydrate, and use Tylenol as needed for pain control.   Pertinent previous records reviewed include ***   Follow Up: ***     Subjective:   I, Candice [name], am serving as a Education administrator for Doctor Candice [name]   Chief Complaint: thoracic back pain    HPI:    02/17/2022 Patient is a 36 year old female complaining of thoracic back pain. Patient states  Beginning around 2018, she began having intermittent episodes of numbness, weakness and dizziness.  In May or June of that year, she began having a recurrence of symptoms, including right flank pain and numbness radiating down her right leg with some associated right leg weakness.  She felt off balance.  She saw neurology at that time.  MRI of thoracic spine revealed subtle enhancing hyperintense focus at the T8-T9 region.would like to discuss OMT    03/10/2022 Patient states that she is alright, left side back is still tight and shoulders    04/07/2022 Patient states that she is okay left side is still crazy even with PT   05/05/2022 Patient states she is good needs a tune up   06/02/2022 Patient states   Relevant Historical Information: Multiple  sclerosis  Additional pertinent review of systems negative.  Current Outpatient Medications  Medication Sig Dispense Refill   cabotegravir & rilpivirine ER (CABENUVA) 600 & 900 MG/3ML injection Inject 1 kit into the muscle every 30 (thirty) days. 6 mL 1   cyclobenzaprine (FLEXERIL) 10 MG tablet TAKE 1 TABLET BY MOUTH THREE TIMES A DAY AS NEEDED FOR MUSCLE SPASMS 30 tablet 2   hydrOXYzine (ATARAX) 25 MG tablet Take 1-2 tablets (25-50 mg total) by mouth 3 (three) times daily as needed. 30 tablet 0   naproxen sodium (ALEVE) 220 MG tablet Take 220 mg by mouth daily as needed (For pain).     ondansetron (ZOFRAN) 4 MG tablet Take 1 tablet (4 mg total) by mouth every 8 (eight) hours as needed for nausea or vomiting. 20 tablet 0   oxyCODONE-acetaminophen (PERCOCET/ROXICET) 5-325 MG tablet Take 1 tablet by mouth every 8 (eight) hours as needed for up to 12 doses for severe pain. 12 tablet 0   psyllium (METAMUCIL) 58.6 % packet Take 1 packet by mouth daily.     senna-docusate (SENOKOT-S) 8.6-50 MG tablet Take 1 tablet by mouth daily. 14 tablet 0   Teriflunomide 14 MG TABS Take 14 mg by mouth daily. 30 tablet 11   valACYclovir (VALTREX) 1000 MG tablet Take 1 tablet (1,000 mg total) by mouth 2 (two) times daily. 10 tablet 2   No current facility-administered medications for this visit.      Objective:     There were no vitals filed for this visit.  There is no height or weight on file to calculate BMI.    Physical Exam:     General: Well-appearing, cooperative, sitting comfortably in no acute distress.   OMT Physical Exam:  ASIS Compression Test: Positive Right Cervical: TTP paraspinal, *** Rib: Bilateral elevated first rib with TTP Thoracic: TTP paraspinal,*** Lumbar: TTP paraspinal,*** Pelvis: Right anterior innominate  Electronically signed by:  Candice [name] D.[name] 7:41 AM 05/30/22

## 2022-06-02 ENCOUNTER — Ambulatory Visit (INDEPENDENT_AMBULATORY_CARE_PROVIDER_SITE_OTHER): Payer: BC Managed Care – PPO | Admitting: Sports Medicine

## 2022-06-02 VITALS — HR 99 | Ht 65.0 in | Wt 190.0 lb

## 2022-06-02 DIAGNOSIS — M9902 Segmental and somatic dysfunction of thoracic region: Secondary | ICD-10-CM | POA: Diagnosis not present

## 2022-06-02 DIAGNOSIS — M546 Pain in thoracic spine: Secondary | ICD-10-CM | POA: Diagnosis not present

## 2022-06-02 DIAGNOSIS — G35 Multiple sclerosis: Secondary | ICD-10-CM

## 2022-06-02 DIAGNOSIS — M9903 Segmental and somatic dysfunction of lumbar region: Secondary | ICD-10-CM

## 2022-06-02 DIAGNOSIS — M9901 Segmental and somatic dysfunction of cervical region: Secondary | ICD-10-CM | POA: Diagnosis not present

## 2022-06-02 DIAGNOSIS — G8929 Other chronic pain: Secondary | ICD-10-CM | POA: Diagnosis not present

## 2022-06-02 DIAGNOSIS — M9908 Segmental and somatic dysfunction of rib cage: Secondary | ICD-10-CM

## 2022-06-02 DIAGNOSIS — M9905 Segmental and somatic dysfunction of pelvic region: Secondary | ICD-10-CM

## 2022-06-02 NOTE — Patient Instructions (Addendum)
Good to see you  Continue PT  4 week follow up [facility]

## 2022-06-03 ENCOUNTER — Ambulatory Visit: Payer: BC Managed Care – PPO | Attending: Sports Medicine

## 2022-06-03 DIAGNOSIS — M546 Pain in thoracic spine: Secondary | ICD-10-CM | POA: Insufficient documentation

## 2022-06-03 DIAGNOSIS — M6281 Muscle weakness (generalized): Secondary | ICD-10-CM | POA: Diagnosis not present

## 2022-06-03 DIAGNOSIS — M25512 Pain in left shoulder: Secondary | ICD-10-CM | POA: Insufficient documentation

## 2022-06-03 NOTE — Therapy (Signed)
OUTPATIENT PHYSICAL THERAPY TREATMENT NOTE   Patient Name: Candice [name] MRN: [id number] DOB:1985/08/18, 36 y.o., female Today's Date: 06/03/2022  PCP: Precious Gilding, DO   REFERRING PROVIDER: Glennon Mac, DO   PT End of Session - 06/03/22 1534     Visit Number 11    Date for PT Re-Evaluation 06/11/22    Authorization Type BCBS    PT Start Time 1533    PT Stop Time 1613    PT Time Calculation (min) 40 min    Activity Tolerance Patient tolerated treatment well    Behavior During Therapy [facility] for tasks assessed/performed               Past Medical History:  Diagnosis Date   Complication of anesthesia    epidural with last pregnancy made entire left side numb, had to d/c   Depression    Headache    HSV 06/28/2007   Hx of migraines    Past Surgical History:  Procedure Laterality Date   BREAST SURGERY  2019   reduction   CESAREAN SECTION     CESAREAN SECTION  09/07/2011   Procedure: CESAREAN SECTION;  Surgeon: Florian Buff, MD;  Location: [facility];  Service: Gynecology;  Laterality: N/A;  Repeat cesarean section with delivery of baby boy at 49. Apgars 9/9.  Bilateral tubal ligation.   IUD REMOVAL  2005   in cervix   TUBAL LIGATION     tubal reanastomosis  08/13/2016   Patient Active Problem List   Diagnosis Date Noted   Constipation 02/18/2022   Arthralgia 02/18/2022   Depression, major, single episode, mild ([place]) 01/13/2022   Cyst of vulva 09/19/2021   Frequent urination 09/11/2021   UTI symptoms 07/30/2021   Medication monitoring encounter 01/11/2021   MS (multiple sclerosis) ([place]) 05/21/2020   GERD (gastroesophageal reflux disease) 05/21/2020   Right-sided abdominal pain of unknown etiology 04/06/2020   Abnormal MRI, spine 04/06/2020   Metatarsalgia of right foot 04/06/2020   Healthcare maintenance 02/10/2020   Right leg numbness 02/07/2020   HIV (human immunodeficiency virus infection) ([place]) 09/07/2019   Hemorrhagic cyst of left ovary  10/14/2016   Abdominal pain 04/16/2016   MRSA colonization 09/25/2014   Major depressive disorder, recurrent episode, moderate ([place]) 06/08/2013   Genital herpes 02/16/2013   Back pain 10/13/2011   Vaginal discharge 10/10/2010   Cervical cancer screening 10/10/2010   OBESITY, NOS 08/20/2006    THERAPY DIAG:  Pain in thoracic spine  Muscle weakness  Left shoulder pain, unspecified chronicity  REFERRING DIAG: Chronic bilateral thoracic back pain [M54.6, G89.29], MS (multiple sclerosis) (H[facility]) [G35]  PERTINENT HISTORY: HIV, MS    PRECAUTIONS/RESTRICTIONS:   MS  SUBJECTIVE:  Patient reports that her L shoulder is hurting today and that her back is feeling pretty good.  Pain:  Are you having pain? Yes Pain location: L UT region and L shoulder, low back NPRS scale:  current 6-7/10 L shoulder, 0/10 low back Aggravating factors: lifting, sleeping, working Relieving factors: rest Pain description: intermittent, sharp, and aching Stage: Subacute Stability: getting better  OBJECTIVE: (objective measures completed at initial evaluation unless otherwise dated)  DIAGNOSTIC FINDINGS:  MRI CX IMPRESSION: 1. Stable and normal MRI appearance of the cervical spinal cord with no evidence for demyelinating disease or abnormal enhancement. 2. Mild cervical spondylosis at C5-6 with resultant mild to moderate bilateral C6 foraminal stenosis. 3. Additional mild spondylosis elsewhere within the cervical spine without significant stenosis or impingement.   MRI Tx Spine IMPRESSION:  1. Stable demyelinating lesion involving the right dorsolateral cord at T8-9. No associated enhancement to suggest active demyelination. No new lesions to suggest interval disease progression. 2. Otherwise normal MRI of the thoracic spine.   SENSATION:          Light touch: Appears intact, subjective report of N/T starting in July   MUSCLE LENGTH: Hamstrings: Right no restriction; Left no  restriction ASLR: Right ASLR = PSLR; Left ASLR = PSLR         Thoracic AROM   AROM AROM  03/04/2022  Flexion WNL, w/ concordant pain  Extension WNL, w/ concordant pain  Right lateral flexion limited by 25%, w/ concordant pain  Left lateral flexion limited by 25%, w/ concordant pain  Right rotation limited by 25%, w/ concordant pain  Left rotation limited by 25%, w/ concordant pain    (Blank rows = not tested)      LUMBAR AROM   AROM AROM  03/04/2022  Flexion WNL  Extension WNL  Right lateral flexion WNL, w/ concordant pain  Left lateral flexion WNL, w/ concordant pain  Right rotation limited by 25%, w/ concordant pain  Left rotation limited by 25%, w/ concordant pain    (Blank rows = not tested   LE MMT:   MMT Right 03/04/2022 Left 03/04/2022  Hip flexion (L2, L3) 4 4  Knee extension (L3) 4+ 4+  Knee flexion 4+ 4+  Hip abduction 3+ 3+  Hip extension      Hip external rotation      Hip internal rotation      Hip adduction      Ankle dorsiflexion (L4)      Ankle plantarflexion (S1) Partial ROM Partial ROM  Ankle inversion      Ankle eversion      Great Toe ext (L5)      Grossly        (Blank rows = not tested, score listed is out of 5 possible points.  N = WNL, D = diminished, C = clear for gross weakness with myotome testing, * = concordant pain with testing)     UPPER EXTREMITY MMT:   MMT Right 03/04/2022 Left 03/04/2022  Shoulder flexion 4+ 4*  Shoulder abduction (C5) 4+ 4*  Shoulder ER 4+ 4*  Shoulder IR 5 5  Middle trapezius 4 4  Lower trapezius      Shoulder extension      Grip strength R 20 kg L 21 kg  Cervical flexion (C1,C2)      Cervical S/B (C3)      Shoulder shrug (C4)      Elbow flexion (C6)      Elbow ext (C7)      Thumb ext (C8)      Finger abd (T1)      Grossly        (Blank rows = not tested, score listed is out of 5 possible points.  N = WNL, D = diminished, C = clear for gross weakness with myotome testing, * = concordant pain  with testing)     Functional Tests   Eval (03/04/2022)      Progressive balance screen (highest level completed for >/= 10''):   Feet together: 10'' Semi Tandem: R in rear 10'', L in rear 10'' Tandem: R in rear 10'', L in rear 10'' SLS: R 5'', L 9''       Supine single leg bridge: L5'', R 5'' partial ROM bil  PALPATION:            TTP costovertebral joints mid and lower thoracic spine (L)    SPINAL SEGMENTAL MOBILITY ASSESSMENT:  See above   PATIENT SURVEYS:  FOTO 44 -> 58  04/08/2022: 65%     TODAY'S TREATMENT  Creating, reviewing, and completing below HEP   PATIENT EDUCATION:  POC, diagnosis, prognosis, HEP, and outcome measures.  Pt educated via explanation, demonstration, and handout (HEP).  Pt confirms understanding verbally.    HOME EXERCISE PROGRAM: Access Code: [email or web address] URL: [email or web address] Date: 03/31/2022 Prepared by: Shearon Balo  Exercises - Shoulder External Rotation and Scapular Retraction with Resistance  - 1 x daily - 7 x weekly - 3 sets - 10 reps - Hooklying Isometric Clamshell  - 1 x daily - 7 x weekly - 3 sets - 10 reps - Staggered Bridge  - 1 x daily - 7 x weekly - 3 sets - 10 reps   ASTERISK SIGNS     Asterisk Signs Eval (03/04/2022) 04/08/2022            SLS <10''  30" on Rt, 13" on Lt           SL bridge L 5'', R 5'' (bil partial ROM)             hip abduction 3+ bil                                           TREATMENT 12/12:  Therapeutic Exercise: - nu-step L7 31mwhile taking subjective and planning session with patient - TY - 15x ea (NT) - paloff press - 2x10 - 10# - Shoulder adduction - GTB - 3x10 (NT)  - ext - 3x10 - scaption - 0# - at wall to 90 - 2x15 (2nd set with 1#) - Seated low rows with 20# 2x15 - Seated high rows with 15# 2x15 - Seated lat pull-downs with 25# 2x15  Manual Therapy - STM L  sided UT, LS and sub occipitals - sub occipital release  TREATMENT 11/29:  Therapeutic Exercise: - nu-step L7 560mhile taking subjective and planning session with patient - TY - 15x ea (NT) - paloff press - 2x10 - 10# - downward chop - 2x10 ea - 10# (d/c d/t pain on L) - Shoulder adduction - GTB - 3x10  - ext - 3x10 - scaption - 0# - at wall to 90# - 2x15 - Seated low rows with 20# 2x15 - Seated high rows with 10# 2x15 - Seated lat pull-downs with 25# 2x15 (NT)  Manual Therapy - STM L sided UT, LS and sub occipitals - sub occipital release    ASSESSMENT:   CLINICAL IMPRESSION: Patient presents to PT with increased pain in her L shoulder today and reports HEP compliance. She has just recently begun working two jobs which she reports has been going well, but has been very tiring, though she states she has not had any recent MS flares. Session today continued to focus on RTC and periscapular strengthening. Patient was able to tolerate all prescribed exercises with no adverse effects. Patient continues to benefit from skilled PT services and should be progressed as able to improve functional independence.   OBJECTIVE IMPAIRMENTS: Pain, thoracic ROM, LE strength, UE strength, balance   ACTIVITY LIMITATIONS: walking for long periods, lifting, work, reaching [place] medical [place] and  pertinent history       GOALS:     SHORT TERM GOALS: Target date: 03/25/2022   Zeola will be >75% HEP compliant to improve carryover between sessions and facilitate independent management of condition   Evaluation (03/04/2022): ongoing Goal status: ACHIEVED 03/26/22: Pt reports non-compliance due to life stressors 04/08/2022: Pt reports HEP adherence     LONG TERM GOALS: Target date: 04/29/2022 (extended to 12/20)   Achol will improve FOTO score to 58 as a proxy for functional improvement   Evaluation/Baseline (03/04/2022): 48 04/08/2022: 65% Goal status: ACHIEVED      2.  Lorana will be able to stand for >30'' in bil SLS stance, to show a significant improvement in balance in order to reduce fall risk    Evaluation/Baseline (03/04/2022): <10'' bil 04/08/2022: 30" on Rt, 13" on Lt Goal status: IN PROGRESS     3.  Zona will improve the following MMTs to >/= 4/5 to show improvement in strength    Evaluation/Baseline (03/04/2022):    LE MMT:   MMT Right 03/04/2022 Left 03/04/2022  Hip flexion (L2, L3) 4 4  Knee extension (L3) 4+ 4+  Knee flexion 4+ 4+  Hip abduction 3+ 3+  Hip extension      Hip external rotation      Hip internal rotation      Hip adduction      Ankle dorsiflexion (L4)      Ankle plantarflexion (S1) Partial ROM Partial ROM  Ankle inversion      Ankle eversion      Great Toe ext (L5)      Grossly        (Blank rows = not tested, score listed is out of 5 possible points.  N = WNL, D = diminished, C = clear for gross weakness with myotome testing, * = concordant pain with testing)    Goal status: INITIAL     4.  Ravonda will self report >/= 50% decrease in pain from evaluation    Evaluation/Baseline (03/04/2022): 9/10 max pain 10/25: 2/10 Goal status: MET     5.  Belenda will be able to perform supine single leg bridge as evidence of improved hip extension and core strength   Evaluation/Baseline (03/04/2022): unable full ROM bil 10/25: MET Goal status: MET         PLAN: PT FREQUENCY: 1-2x/week   PT DURATION: 8 weeks (Ending 12/20)   PLANNED INTERVENTIONS: Therapeutic exercises, Aquatic therapy, Therapeutic activity, Neuro Muscular re-education, Gait training, Patient/Family education, Joint mobilization, Dry Needling, Electrical stimulation, Spinal mobilization and/or manipulation, Moist heat, Taping, Vasopneumatic device, Ionotophoresis 55m/ml Dexamethasone, and Manual therapy   PLAN FOR NEXT SESSION: progressive hip and core strengthening, balance, shoulder girdle and R/C strengthening, manual therapy  PRN    SMargarette [place] PTA 06/03/22 4:13 PM

## 2022-06-04 ENCOUNTER — Ambulatory Visit: Payer: BC Managed Care – PPO | Admitting: Physical Therapy

## 2022-06-11 ENCOUNTER — Encounter: Payer: Self-pay | Admitting: Physical Therapy

## 2022-06-11 ENCOUNTER — Ambulatory Visit: Payer: BC Managed Care – PPO | Admitting: Physical Therapy

## 2022-06-11 DIAGNOSIS — M6281 Muscle weakness (generalized): Secondary | ICD-10-CM | POA: Diagnosis not present

## 2022-06-11 DIAGNOSIS — M25512 Pain in left shoulder: Secondary | ICD-10-CM | POA: Diagnosis not present

## 2022-06-11 DIAGNOSIS — M546 Pain in thoracic spine: Secondary | ICD-10-CM | POA: Diagnosis not present

## 2022-06-11 NOTE — Therapy (Signed)
OUTPATIENT PHYSICAL THERAPY TREATMENT NOTE   Patient Name: Candice [name] MRN: [id number] DOB:10-28-1985, 36 y.o., female Today's Date: 06/11/2022  PCP: Precious Gilding, DO   REFERRING PROVIDER: Glennon Mac, DO   PT End of Session - 06/11/22 1618     Visit Number 12    Date for PT Re-Evaluation 06/11/22    Authorization Type BCBS    PT Start Time 1617    PT Stop Time 1657    PT Time Calculation (min) 40 min    Activity Tolerance Patient tolerated treatment well    Behavior During Therapy [facility] for tasks assessed/performed               Past Medical History:  Diagnosis Date   Complication of anesthesia    epidural with last pregnancy made entire left side numb, had to d/c   Depression    Headache    HSV 06/28/2007   Hx of migraines    Past Surgical History:  Procedure Laterality Date   BREAST SURGERY  2019   reduction   CESAREAN SECTION     CESAREAN SECTION  09/07/2011   Procedure: CESAREAN SECTION;  Surgeon: Florian Buff, MD;  Location: [facility];  Service: Gynecology;  Laterality: N/A;  Repeat cesarean section with delivery of baby boy at 56. Apgars 9/9.  Bilateral tubal ligation.   IUD REMOVAL  2005   in cervix   TUBAL LIGATION     tubal reanastomosis  08/13/2016   Patient Active Problem List   Diagnosis Date Noted   Constipation 02/18/2022   Arthralgia 02/18/2022   Depression, major, single episode, mild ([place]) 01/13/2022   Cyst of vulva 09/19/2021   Frequent urination 09/11/2021   UTI symptoms 07/30/2021   Medication monitoring encounter 01/11/2021   MS (multiple sclerosis) ([place]) 05/21/2020   GERD (gastroesophageal reflux disease) 05/21/2020   Right-sided abdominal pain of unknown etiology 04/06/2020   Abnormal MRI, spine 04/06/2020   Metatarsalgia of right foot 04/06/2020   Healthcare maintenance 02/10/2020   Right leg numbness 02/07/2020   HIV (human immunodeficiency virus infection) ([place]) 09/07/2019   Hemorrhagic cyst of left ovary  10/14/2016   Abdominal pain 04/16/2016   MRSA colonization 09/25/2014   Major depressive disorder, recurrent episode, moderate ([place]) 06/08/2013   Genital herpes 02/16/2013   Back pain 10/13/2011   Vaginal discharge 10/10/2010   Cervical cancer screening 10/10/2010   OBESITY, NOS 08/20/2006    THERAPY DIAG:  Pain in thoracic spine  Muscle weakness  Left shoulder pain, unspecified chronicity  REFERRING DIAG: Chronic bilateral thoracic back pain [M54.6, G89.29], MS (multiple sclerosis) (HCC) [G35]  PERTINENT HISTORY: HIV, MS    PRECAUTIONS/RESTRICTIONS:   MS  SUBJECTIVE:  Pt reports continued L shoulder pain and wonders if she is having an MS flare.  Pain:  Are you having pain? Yes Pain location: L UT region and L shoulder, low back NPRS scale:  current 6-7/10 L shoulder, 0/10 low back Aggravating factors: lifting, sleeping, working Relieving factors: rest Pain description: intermittent, sharp, and aching Stage: Subacute Stability: getting better  OBJECTIVE: (objective measures completed at initial evaluation unless otherwise dated)  DIAGNOSTIC FINDINGS:  MRI CX IMPRESSION: 1. Stable and normal MRI appearance of the cervical spinal cord with no evidence for demyelinating disease or abnormal enhancement. 2. Mild cervical spondylosis at C5-6 with resultant mild to moderate bilateral C6 foraminal stenosis. 3. Additional mild spondylosis elsewhere within the cervical spine without significant stenosis or impingement.   MRI Tx Spine IMPRESSION: 1. Stable  demyelinating lesion involving the right dorsolateral cord at T8-9. No associated enhancement to suggest active demyelination. No new lesions to suggest interval disease progression. 2. Otherwise normal MRI of the thoracic spine.   SENSATION:          Light touch: Appears intact, subjective report of N/T starting in July   MUSCLE LENGTH: Hamstrings: Right no restriction; Left no restriction ASLR: Right ASLR =  PSLR; Left ASLR = PSLR         Thoracic AROM   AROM AROM  03/04/2022  Flexion WNL, w/ concordant pain  Extension WNL, w/ concordant pain  Right lateral flexion limited by 25%, w/ concordant pain  Left lateral flexion limited by 25%, w/ concordant pain  Right rotation limited by 25%, w/ concordant pain  Left rotation limited by 25%, w/ concordant pain    (Blank rows = not tested)      LUMBAR AROM   AROM AROM  03/04/2022  Flexion WNL  Extension WNL  Right lateral flexion WNL, w/ concordant pain  Left lateral flexion WNL, w/ concordant pain  Right rotation limited by 25%, w/ concordant pain  Left rotation limited by 25%, w/ concordant pain    (Blank rows = not tested   LE MMT:   MMT Right 03/04/2022 Left 03/04/2022  Hip flexion (L2, L3) 4 4  Knee extension (L3) 4+ 4+  Knee flexion 4+ 4+  Hip abduction 3+ 3+  Hip extension      Hip external rotation      Hip internal rotation      Hip adduction      Ankle dorsiflexion (L4)      Ankle plantarflexion (S1) Partial ROM Partial ROM  Ankle inversion      Ankle eversion      Great Toe ext (L5)      Grossly        (Blank rows = not tested, score listed is out of 5 possible points.  N = WNL, D = diminished, C = clear for gross weakness with myotome testing, * = concordant pain with testing)     UPPER EXTREMITY MMT:   MMT Right 03/04/2022 Left 03/04/2022  Shoulder flexion 4+ 4*  Shoulder abduction (C5) 4+ 4*  Shoulder ER 4+ 4*  Shoulder IR 5 5  Middle trapezius 4 4  Lower trapezius      Shoulder extension      Grip strength R 20 kg L 21 kg  Cervical flexion (C1,C2)      Cervical S/B (C3)      Shoulder shrug (C4)      Elbow flexion (C6)      Elbow ext (C7)      Thumb ext (C8)      Finger abd (T1)      Grossly        (Blank rows = not tested, score listed is out of 5 possible points.  N = WNL, D = diminished, C = clear for gross weakness with myotome testing, * = concordant pain with testing)     Functional  Tests   Eval (03/04/2022)      Progressive balance screen (highest level completed for >/= 10''):   Feet together: 10'' Semi Tandem: R in rear 10'', L in rear 10'' Tandem: R in rear 10'', L in rear 10'' SLS: R 5'', L 9''       Supine single leg bridge: L5'', R 5'' partial ROM bil  PALPATION:            TTP costovertebral joints mid and lower thoracic spine (L)    SPINAL SEGMENTAL MOBILITY ASSESSMENT:  See above   PATIENT SURVEYS:  FOTO 44 -> 58  04/08/2022: 65%     TODAY'S TREATMENT  Creating, reviewing, and completing below HEP   PATIENT EDUCATION:  POC, diagnosis, prognosis, HEP, and outcome measures.  Pt educated via explanation, demonstration, and handout (HEP).  Pt confirms understanding verbally.    HOME EXERCISE PROGRAM: Access Code: [email or web address] URL: [email or web address] Date: 03/31/2022 Prepared by: Shearon Balo  Exercises - Shoulder External Rotation and Scapular Retraction with Resistance  - 1 x daily - 7 x weekly - 3 sets - 10 reps - Hooklying Isometric Clamshell  - 1 x daily - 7 x weekly - 3 sets - 10 reps - Staggered Bridge  - 1 x daily - 7 x weekly - 3 sets - 10 reps   ASTERISK SIGNS     Asterisk Signs Eval (03/04/2022) 04/08/2022            SLS <10''  30" on Rt, 13" on Lt           SL bridge L 5'', R 5'' (bil partial ROM)             hip abduction 3+ bil                                           TREATMENT 12/20:  Therapeutic Exercise: - TW - 10x ea - Seated low rows with 20# 2x15 - Seated high rows with 15# 2x15 - Seated lat pull-downs with 25# 2x15  Manual Therapy - AP GH joint mobs - G III and IV -STM L sided UT, LS and sub occipitals - sub occipital release     ASSESSMENT:   CLINICAL IMPRESSION: Pt with continued L shoulder pain that limits activity.  She is able to tolerate listed exercises with minimal  increase in pain with the exception of T and W which were D/C'd d/t pain.  Pt responded well to manual therapy with reduction in muscle tension, but continued pain.  OBJECTIVE IMPAIRMENTS: Pain, thoracic ROM, LE strength, UE strength, balance   ACTIVITY LIMITATIONS: walking for long periods, lifting, work, reaching [place]: See medical history and pertinent history       GOALS:     SHORT TERM GOALS: Target date: 03/25/2022   Zira will be >75% HEP compliant to improve carryover between sessions and facilitate independent management of condition   Evaluation (03/04/2022): ongoing Goal status: ACHIEVED 03/26/22: Pt reports non-compliance due to life stressors 04/08/2022: Pt reports HEP adherence     LONG TERM GOALS: Target date: 04/29/2022 (extended to 12/20)   Shonte will improve FOTO score to 58 as a proxy for functional improvement   Evaluation/Baseline (03/04/2022): 48 04/08/2022: 65% Goal status: ACHIEVED     2.  Marelly will be able to stand for >30'' in bil SLS stance, to show a significant improvement in balance in order to reduce fall risk    Evaluation/Baseline (03/04/2022): <10'' bil 04/08/2022: 30" on Rt, 13" on Lt Goal status: IN PROGRESS     3.  Aleta will improve the following MMTs to >/= 4/5 to show improvement in strength    Evaluation/Baseline (03/04/2022):    LE MMT:   MMT Right 03/04/2022 Left  03/04/2022  Hip flexion (L2, L3) 4 4  Knee extension (L3) 4+ 4+  Knee flexion 4+ 4+  Hip abduction 3+ 3+  Hip extension      Hip external rotation      Hip internal rotation      Hip adduction      Ankle dorsiflexion (L4)      Ankle plantarflexion (S1) Partial ROM Partial ROM  Ankle inversion      Ankle eversion      Great Toe ext (L5)      Grossly        (Blank rows = not tested, score listed is out of 5 possible points.  N = WNL, D = diminished, C = clear for gross weakness with myotome testing, * = concordant pain with testing)     Goal status: INITIAL     4.  Nohemy will self report >/= 50% decrease in pain from evaluation    Evaluation/Baseline (03/04/2022): 9/10 max pain 10/25: 2/10 Goal status: MET     5.  Dezerae will be able to perform supine single leg bridge as evidence of improved hip extension and core strength   Evaluation/Baseline (03/04/2022): unable full ROM bil 10/25: MET Goal status: MET         PLAN: PT FREQUENCY: 1-2x/week   PT DURATION: 8 weeks (Ending 12/20)   PLANNED INTERVENTIONS: Therapeutic exercises, Aquatic therapy, Therapeutic activity, Neuro Muscular re-education, Gait training, Patient/Family education, Joint mobilization, Dry Needling, Electrical stimulation, Spinal mobilization and/or manipulation, Moist heat, Taping, Vasopneumatic device, Ionotophoresis 32m/ml Dexamethasone, and Manual therapy   PLAN FOR NEXT SESSION: progressive hip and core strengthening, balance, shoulder girdle and R/C strengthening, manual therapy PRN    KMathis DadPT 06/11/22 4:57 PM

## 2022-06-20 ENCOUNTER — Encounter: Payer: BC Managed Care – PPO | Admitting: Family

## 2022-06-20 ENCOUNTER — Ambulatory Visit (INDEPENDENT_AMBULATORY_CARE_PROVIDER_SITE_OTHER): Payer: BC Managed Care – PPO | Admitting: Family

## 2022-06-20 ENCOUNTER — Other Ambulatory Visit: Payer: Self-pay

## 2022-06-20 ENCOUNTER — Encounter: Payer: Self-pay | Admitting: Family

## 2022-06-20 VITALS — BP 126/81 | HR 79 | Temp 98.3°F | Ht 63.0 in | Wt 191.0 lb

## 2022-06-20 DIAGNOSIS — E559 Vitamin D deficiency, unspecified: Secondary | ICD-10-CM | POA: Diagnosis not present

## 2022-06-20 DIAGNOSIS — Z0000 Encounter for general adult medical examination without abnormal findings: Secondary | ICD-10-CM

## 2022-06-20 DIAGNOSIS — Z21 Asymptomatic human immunodeficiency virus [HIV] infection status: Secondary | ICD-10-CM

## 2022-06-20 DIAGNOSIS — G35 Multiple sclerosis: Secondary | ICD-10-CM | POA: Diagnosis not present

## 2022-06-20 HISTORY — DX: Vitamin D deficiency, unspecified: E55.9

## 2022-06-20 MED ORDER — CABOTEGRAVIR & RILPIVIRINE ER 600 & 900 MG/3ML IM SUER
1.0000 | Freq: Once | INTRAMUSCULAR | Status: AC
  Administered 2022-06-20: 1 via INTRAMUSCULAR

## 2022-06-20 NOTE — Assessment & Plan Note (Signed)
Memory has history of Vitamin D deficiency and take supplements. Check Vitamin D level. Supplementation as needed pending lab work results.

## 2022-06-20 NOTE — Assessment & Plan Note (Signed)
Candice [name] continues to have well controlled virus and good adherence to [place]. Reviewed previous lab work and discussed plan of care. Check lab work. Injection of Cabenuva provided without complication. Plan for follow up in 2 months for next injection and with NP/MD in 6 months.

## 2022-06-20 NOTE — Assessment & Plan Note (Signed)
Stable at current and not currently on medication. Continue management per Neurology.

## 2022-06-20 NOTE — Progress Notes (Signed)
Brief Narrative   Patient ID: Candice [name], female    DOB: Nov 25, 1985, 36 y.o.   MRN: [id number]  Candice [name] is a 36 y/o female diagnosed with HIV-1 disease diagnosed in March 2021 with risk factor of heterosexual contact. Initial CD4 count was 757 and viral load of 421. Genotype was Subtype B with no significant medication resistant mutations. No history of opportunistic infection. Candice [name] negative. Entered care at [facility] Stage 1. Previous ART exposure to [employer].   Subjective:    Chief Complaint  Patient presents with   Follow-up    HPI:  Candice [name] is a 36 y.o. female with HIV disease last seen on 04/30/22 by Alfonse Spruce PharmD, CPP with well controlled virus and good tolerance to [place]. Most recent viral load from 02/14/22 was undetectable. Received her Cabenuva with no complications. Here today for follow up and next injection.   Candice [name] continues to do well with Cabenuva with minimal soreness following injection. Feeling well with no new concerns/complaints. MS is doing okay with continued left side issues and not currently on medication and continues to follow with Neurology. Working two jobs at [employer]. Condoms and STD testing offered.  Denies fevers, chills, night sweats, headaches, changes in vision, neck pain/stiffness, nausea, diarrhea, vomiting, lesions or rashes.   Allergies  Allergen Reactions   Contrast Media [Iodinated Contrast Media] Nausea And Vomiting      Outpatient Medications Prior to Visit  Medication Sig Dispense Refill   cabotegravir & rilpivirine ER (CABENUVA) 600 & 900 MG/3ML injection Inject 1 kit into the muscle every 30 (thirty) days. 6 mL 1   cyclobenzaprine (FLEXERIL) 10 MG tablet TAKE 1 TABLET BY MOUTH THREE TIMES A DAY AS NEEDED FOR MUSCLE SPASMS 30 tablet 2   hydrOXYzine (ATARAX) 25 MG tablet Take 1-2 tablets (25-50 mg total) by mouth 3 (three) times daily as needed. 30 tablet 0   naproxen sodium (ALEVE)  220 MG tablet Take 220 mg by mouth daily as needed (For pain).     ondansetron (ZOFRAN) 4 MG tablet Take 1 tablet (4 mg total) by mouth every 8 (eight) hours as needed for nausea or vomiting. 20 tablet 0   oxyCODONE-acetaminophen (PERCOCET/ROXICET) 5-325 MG tablet Take 1 tablet by mouth every 8 (eight) hours as needed for up to 12 doses for severe pain. 12 tablet 0   psyllium (METAMUCIL) 58.6 % packet Take 1 packet by mouth daily.     senna-docusate (SENOKOT-S) 8.6-50 MG tablet Take 1 tablet by mouth daily. 14 tablet 0   Teriflunomide 14 MG TABS Take 14 mg by mouth daily. 30 tablet 11   valACYclovir (VALTREX) 1000 MG tablet Take 1 tablet (1,000 mg total) by mouth 2 (two) times daily. 10 tablet 2   No facility-administered medications prior to visit.     Past Medical History:  Diagnosis Date   Complication of anesthesia    epidural with last pregnancy made entire left side numb, had to d/c   Depression    Headache    HSV 06/28/2007   Hx of migraines      Past Surgical History:  Procedure Laterality Date   BREAST SURGERY  2019   reduction   CESAREAN SECTION     CESAREAN SECTION  09/07/2011   Procedure: CESAREAN SECTION;  Surgeon: Florian Buff, MD;  Location: [facility];  Service: Gynecology;  Laterality: N/A;  Repeat cesarean section with delivery of baby boy at 59. Apgars 9/9.  Bilateral  tubal ligation.   IUD REMOVAL  2005   in cervix   TUBAL LIGATION     tubal reanastomosis  08/13/2016      Review of Systems  Constitutional:  Negative for appetite change, chills, diaphoresis, fatigue, fever and unexpected weight change.  Eyes:        Negative for acute change in vision  Respiratory:  Negative for chest tightness, shortness of breath and wheezing.   Cardiovascular:  Negative for chest pain.  Gastrointestinal:  Negative for diarrhea, nausea and vomiting.  Genitourinary:  Negative for dysuria, pelvic pain and vaginal discharge.  Musculoskeletal:  Negative for neck pain and neck  stiffness.  Skin:  Negative for rash.  Neurological:  Negative for seizures, syncope, weakness and headaches.  Hematological:  Negative for adenopathy. Does not bruise/bleed easily.  Psychiatric/Behavioral:  Negative for hallucinations.       Objective:    BP 126/81   Pulse 79   Temp 98.3 F (36.8 C) (Temporal)   Ht 5' 3" (1.6 m)   Wt 191 lb (86.6 kg)   SpO2 100%   BMI 33.83 kg/m  Nursing note and vital signs reviewed.  Physical Exam Constitutional:      General: She is not in acute distress.    Appearance: She is well-developed.  Eyes:     Conjunctiva/sclera: Conjunctivae normal.  Cardiovascular:     Rate and Rhythm: Normal rate and regular rhythm.     Heart sounds: Normal heart sounds. No murmur heard.    No friction rub. No gallop.  Pulmonary:     Effort: Pulmonary effort is normal. No respiratory distress.     Breath sounds: Normal breath sounds. No wheezing or rales.  Chest:     Chest wall: No tenderness.  Abdominal:     General: Bowel sounds are normal.     Palpations: Abdomen is soft.     Tenderness: There is no abdominal tenderness.  Musculoskeletal:     Cervical back: Neck supple.  Lymphadenopathy:     Cervical: No cervical adenopathy.  Skin:    General: Skin is warm and dry.     Findings: No rash.  Neurological:     Mental Status: She is alert and oriented to person, place, and time.  Psychiatric:        Behavior: Behavior normal.        Thought Content: Thought content normal.        Judgment: Judgment normal.         06/20/2022   10:29 AM 02/18/2022    8:58 AM 01/13/2022    3:03 PM 09/18/2021    2:11 PM 09/11/2021    2:33 PM  Depression screen PHQ 2/9  Decreased Interest 0 1 1 0 1  Down, Depressed, Hopeless 0 1 1 0 0  PHQ - 2 Score 0 2 2 0 1  Altered sleeping  _0 Tired, decreased energy  _1 Change in appetite  _2 Feeling bad or failure about yourself   0 0 0 1  Trouble concentrating  0 0 0 0  Moving slowly or  fidgety/restless  0 0 0 0  Suicidal thoughts  0 0 0 0  PHQ-9 Score  _3 Difficult doing work/chores    Somewhat difficult Somewhat difficult       Assessment & Plan:    Patient Active Problem List   Diagnosis Date Noted   Vitamin D  deficiency 06/20/2022   Constipation 02/18/2022   Arthralgia 02/18/2022   Depression, major, single episode, mild ([place]) 01/13/2022   Cyst of vulva 09/19/2021   Frequent urination 09/11/2021   UTI symptoms 07/30/2021   Medication monitoring encounter 01/11/2021   MS (multiple sclerosis) ([place]) 05/21/2020   GERD (gastroesophageal reflux disease) 05/21/2020   Right-sided abdominal pain of unknown etiology 04/06/2020   Abnormal MRI, spine 04/06/2020   Metatarsalgia of right foot 04/06/2020   Healthcare maintenance 02/10/2020   Right leg numbness 02/07/2020   HIV (human immunodeficiency virus infection) ([place]) 09/07/2019   Hemorrhagic cyst of left ovary 10/14/2016   Abdominal pain 04/16/2016   MRSA colonization 09/25/2014   Major depressive disorder, recurrent episode, moderate ([place]) 06/08/2013   Genital herpes 02/16/2013   Back pain 10/13/2011   Vaginal discharge 10/10/2010   Cervical cancer screening 10/10/2010   OBESITY, NOS 08/20/2006     Problem List Items Addressed This Visit       Nervous and Auditory   MS (multiple sclerosis) ([place])    Stable at current and not currently on medication. Continue management per Neurology.        Other   HIV (human immunodeficiency virus infection) ([place]) - Primary    Anelia continues to have well controlled virus and good adherence to [place]. Reviewed previous lab work and discussed plan of care. Check lab work. Injection of Cabenuva provided without complication. Plan for follow up in 2 months for next injection and with NP/MD in 6 months.       Relevant Orders   COMPLETE METABOLIC PANEL WITH GFR   HIV-1 RNA quant-no reflex-bld   T-helper cells (CD4) count (not at [facility])   Healthcare  maintenance    Discussed importance of safe sexual practice and condom use. Condoms and STD testing offered.  Declines vaccinations.       Vitamin D deficiency    Candice [name] has history of Vitamin D deficiency and take supplements. Check Vitamin D level. Supplementation as needed pending lab work results.       Relevant Orders   Vitamin D (25 hydroxy)     I am having Candice [name] maintain her naproxen sodium, psyllium, cabotegravir & rilpivirine ER, hydrOXYzine, valACYclovir, oxyCODONE-acetaminophen, senna-docusate, ondansetron, cyclobenzaprine, and Teriflunomide. We administered cabotegravir & rilpivirine ER.   Meds ordered this encounter  Medications   cabotegravir & rilpivirine ER (CABENUVA) 600 & 900 MG/3ML injection 1 kit     Follow-up: Return in about 6 months (around 12/20/2022), or if symptoms worsen or fail to improve.   Terri Piedra, MSN, FNP-C Nurse Practitioner [facility] [place] number: [phone]

## 2022-06-20 NOTE — Patient Instructions (Addendum)
Nice to see you.  We will check your lab work today.  Continue to take your medication daily as prescribed.  Plan for follow up in 6 months or sooner if needed with lab work on the same day.  Have a great day and stay safe!

## 2022-06-20 NOTE — Assessment & Plan Note (Signed)
Discussed importance of safe sexual practice and condom use. Condoms and STD testing offered.  Declines vaccinations.  

## 2022-06-24 LAB — COMPLETE METABOLIC PANEL WITH GFR
AG Ratio: 1.5 (calc) (ref 1.0–2.5)
ALT: 11 U/L (ref 6–29)
AST: 15 U/L (ref 10–30)
Albumin: 4.1 g/dL (ref 3.6–5.1)
Alkaline phosphatase (APISO): 46 U/L (ref 31–125)
BUN: 14 mg/dL (ref 7–25)
CO2: 27 mmol/L (ref 20–32)
Calcium: 9.3 mg/dL (ref 8.6–10.2)
Chloride: 105 mmol/L (ref 98–110)
Creat: 0.65 mg/dL (ref 0.50–0.97)
Globulin: 2.8 g/dL (calc) (ref 1.9–3.7)
Glucose, Bld: 85 mg/dL (ref 65–99)
Potassium: 3.9 mmol/L (ref 3.5–5.3)
Sodium: 140 mmol/L (ref 135–146)
Total Bilirubin: 0.2 mg/dL (ref 0.2–1.2)
Total Protein: 6.9 g/dL (ref 6.1–8.1)
eGFR: 116 mL/min/{1.73_m2} (ref >=60)

## 2022-06-24 LAB — T-HELPER CELLS (CD4) COUNT (NOT AT ARMC)
Absolute CD4: 1071 cells/uL (ref 490–1740)
CD4 T Helper %: 49 % (ref 30–61)
Total lymphocyte count: 2197 cells/uL (ref 850–3900)

## 2022-06-24 LAB — VITAMIN D 25 HYDROXY (VIT D DEFICIENCY, FRACTURES): Vit D, 25-Hydroxy: 43 ng/mL (ref 30–100)

## 2022-06-24 LAB — HIV-1 RNA QUANT-NO REFLEX-BLD
HIV 1 RNA Quant: <20 Copies/mL — ABNORMAL HIGH
HIV-1 RNA Quant, Log: <1.3 Log cps/mL — ABNORMAL HIGH

## 2022-06-27 NOTE — Progress Notes (Unsigned)
Benito Mccreedy D.[place] Phone: [phone]   Assessment and Plan:    1. Chronic left shoulder pain 2. Tendinitis of left rotator cuff  -Chronic with exacerbation, initial sports medicine visit - We have been treating patient for multiple musculoskeletal complaints with OMT and physical therapy which has helped things overall, however patient has continued to have left shoulder discomfort most consistent with subacromial bursitis versus rotator cuff tendinopathy - Incomplete relief with as needed Advil.  May continue to use Tylenol/NSAIDs as needed - Continue physical therapy and start HEP for rotator cuff - Patient elected for subacromial CSI.  Tolerated well per note below  Procedure: Subacromial Injection Side: Left  Risks explained and consent was given verbally. The site was cleaned with alcohol prep. A steroid injection was performed from posterior approach using 92m of 1% lidocaine without epinephrine and 149mof kenalog '40mg'$ /ml. This was well tolerated and resulted in symptomatic relief.  Needle was removed, hemostasis achieved, and post injection instructions were explained.   Pt was advised to call or return to clinic if these symptoms worsen or fail to improve as anticipated.   Pertinent previous records reviewed include none   Follow Up: 3 weeks for reevaluation.  If no improvement or worsening of symptoms of left shoulder, would obtain x-ray and could consider ultrasound versus intra-articular CSI.  If improving, could discuss repeat OMT     Subjective:   I, Patrik Turnbaugh, am serving as a scEducation administratoror Doctor BeGlennon Mac Chief Complaint: thoracic back pain    HPI:    02/17/2022 Patient is a [age]ear old female complaining of thoracic back pain. Patient states  Beginning around 2018, she began having intermittent episodes of numbness, weakness and dizziness.  In May or June of that year, she began having a  recurrence of symptoms, including right flank pain and numbness radiating down her right leg with some associated right leg weakness.  She felt off balance.  She saw neurology at that time.  MRI of thoracic spine revealed subtle enhancing hyperintense focus at the T8-T9 region.would like to discuss OMT    03/10/2022 Patient states that she is alright, left side back is still tight and shoulders    04/07/2022 Patient states that she is okay left side is still crazy even with PT   05/05/2022 Patient states she is good needs a tune up   06/02/2022 Patient states that she is pretty good    06/30/2022 Patient states left side is a problem , PT told her to re check rotator cuff   Relevant Historical Information: Multiple sclerosis  Additional pertinent review of systems negative.  Current Outpatient Medications  Medication Sig Dispense Refill   cabotegravir & rilpivirine ER (CABENUVA) 600 & 900 MG/3ML injection Inject 1 kit into the muscle every 30 (thirty) days. 6 mL 1   cyclobenzaprine (FLEXERIL) 10 MG tablet TAKE 1 TABLET BY MOUTH THREE TIMES A DAY AS NEEDED FOR MUSCLE SPASMS 30 tablet 2   hydrOXYzine (ATARAX) 25 MG tablet Take 1-2 tablets (25-50 mg total) by mouth 3 (three) times daily as needed. 30 tablet 0   naproxen sodium (ALEVE) 220 MG tablet Take 220 mg by mouth daily as needed (For pain).     ondansetron (ZOFRAN) 4 MG tablet Take 1 tablet (4 mg total) by mouth every 8 (eight) hours as needed for nausea or vomiting. 20 tablet 0   oxyCODONE-acetaminophen (PERCOCET/ROXICET) 5-325 MG tablet  Take 1 tablet by mouth every 8 (eight) hours as needed for up to 12 doses for severe pain. 12 tablet 0   psyllium (METAMUCIL) 58.6 % packet Take 1 packet by mouth daily.     senna-docusate (SENOKOT-S) 8.6-50 MG tablet Take 1 tablet by mouth daily. 14 tablet 0   Teriflunomide 14 MG TABS Take 14 mg by mouth daily. 30 tablet 11   valACYclovir (VALTREX) 1000 MG tablet Take 1 tablet (1,000 mg total) by  mouth 2 (two) times daily. 10 tablet 2   No current facility-administered medications for this visit.      Objective:     Vitals:   06/30/22 1103  Pulse: 74  SpO2: 96%  Weight: 194 lb (88 kg)  Height: '5\' 3"'$  (1.6 m)      Body mass index is 34.37 kg/m.    Physical Exam:     Gen: Appears well, nad, nontoxic and pleasant Neuro:sensation intact, strength is 5/5 with df/pf/inv/ev, muscle tone wnl Skin: no suspicious lesion or defmority Psych: A&O, appropriate mood and affect  Left shoulder: no deformity, swelling or muscle wasting No scapular winging FF 180, abd 180, int 0, ext 90.  Painful at end range ROM throughout TTP trapezius NTTP over the [place], clavicle, ac, coracoid, biceps groove, humerus, deltoid,  , cervical spine Positive Neer, Hawkins, O'Brien Neg   empty can,  , crossarm, subscap liftoff, speeds Neg ant drawer, sulcus sign, apprehension Negative Spurling's test bilat FROM of neck   Electronically signed by:  Benito Mccreedy D.[name] 11:32 AM 06/30/22

## 2022-06-30 ENCOUNTER — Ambulatory Visit (INDEPENDENT_AMBULATORY_CARE_PROVIDER_SITE_OTHER): Payer: BC Managed Care – PPO | Admitting: Sports Medicine

## 2022-06-30 VITALS — HR 74 | Ht 63.0 in | Wt 194.0 lb

## 2022-06-30 DIAGNOSIS — G8929 Other chronic pain: Secondary | ICD-10-CM

## 2022-06-30 DIAGNOSIS — M25512 Pain in left shoulder: Secondary | ICD-10-CM

## 2022-06-30 DIAGNOSIS — M7582 Other shoulder lesions, left shoulder: Secondary | ICD-10-CM

## 2022-06-30 NOTE — Patient Instructions (Signed)
Good to see you Shoulder HEP 3 week follow up  

## 2022-07-21 NOTE — Progress Notes (Unsigned)
Candice [name] D.[place] Phone: [phone]   Assessment and Plan:    1. Chronic left shoulder pain 2. Tendinitis of left rotator cuff -Chronic exacerbation, subsequent visit - Overall significant improvement in left shoulder pain after subacromial CSI at previous office visit - Continue HEP and Tylenol/NSAIDs as needed for pain relief  3. Chronic bilateral thoracic back pain 4. Neck pain 5. Somatic dysfunction of cervical region 6. Somatic dysfunction of thoracic region 7. Somatic dysfunction of lumbar region 8. Somatic dysfunction of pelvic region 9. Somatic dysfunction of rib region  -Chronic with exacerbation, subsequent visit - Recurrent upper thoracic and cervical pain that overall been treated and controlled well with frequent OMT visits and HEP - Patient has received significant relief with OMT in the past.  Elects for repeat OMT today.  Tolerated well per note below. - Decision today to treat with OMT was based on Physical Exam   After verbal consent patient was treated with HVLA (high velocity low amplitude), ME (muscle energy), FPR (flex positional release), ST (soft tissue), PC/PD (Pelvic Compression/ Pelvic Decompression) techniques in cervical, rib, thoracic, lumbar, and pelvic areas. Patient tolerated the procedure well with improvement in symptoms.  Patient educated on potential side effects of soreness and recommended to rest, hydrate, and use Tylenol as needed for pain control.   Pertinent previous records reviewed include none   Follow Up: 4 weeks for reevaluation.  Could consider repeat OMT   Subjective:   I, Candice [name], am serving as a Education administrator for Doctor Glennon Mac   Chief Complaint: thoracic back pain    HPI:    02/17/2022 Patient is a 37 year old female complaining of thoracic back pain. Patient states  Beginning around 2018, she began having intermittent episodes of numbness,  weakness and dizziness.  In May or June of that year, she began having a recurrence of symptoms, including right flank pain and numbness radiating down her right leg with some associated right leg weakness.  She felt off balance.  She saw neurology at that time.  MRI of thoracic spine revealed subtle enhancing hyperintense focus at the T8-T9 region.would like to discuss OMT    03/10/2022 Patient states that she is alright, left side back is still tight and shoulders    04/07/2022 Patient states that she is okay left side is still crazy even with PT   05/05/2022 Patient states she is good needs a tune up   06/02/2022 Patient states that she is pretty good    06/30/2022 Patient states left side is a problem , PT told her to re check rotator cuff  07/22/2022 Patient states she is pretty good, CSI was a life saver     Relevant Historical Information: Multiple sclerosis  Additional pertinent review of systems negative.  Current Outpatient Medications  Medication Sig Dispense Refill   cabotegravir & rilpivirine ER (CABENUVA) 600 & 900 MG/3ML injection Inject 1 kit into the muscle every 30 (thirty) days. 6 mL 1   cyclobenzaprine (FLEXERIL) 10 MG tablet TAKE 1 TABLET BY MOUTH THREE TIMES A DAY AS NEEDED FOR MUSCLE SPASMS 30 tablet 2   hydrOXYzine (ATARAX) 25 MG tablet Take 1-2 tablets (25-50 mg total) by mouth 3 (three) times daily as needed. 30 tablet 0   naproxen sodium (ALEVE) 220 MG tablet Take 220 mg by mouth daily as needed (For pain).     ondansetron (ZOFRAN) 4 MG tablet Take 1 tablet (4 mg  total) by mouth every 8 (eight) hours as needed for nausea or vomiting. 20 tablet 0   oxyCODONE-acetaminophen (PERCOCET/ROXICET) 5-325 MG tablet Take 1 tablet by mouth every 8 (eight) hours as needed for up to 12 doses for severe pain. 12 tablet 0   psyllium (METAMUCIL) 58.6 % packet Take 1 packet by mouth daily.     senna-docusate (SENOKOT-S) 8.6-50 MG tablet Take 1 tablet by mouth daily. 14 tablet 0    Teriflunomide 14 MG TABS Take 14 mg by mouth daily. 30 tablet 11   valACYclovir (VALTREX) 1000 MG tablet Take 1 tablet (1,000 mg total) by mouth 2 (two) times daily. 10 tablet 2   No current facility-administered medications for this visit.      Objective:     Vitals:   07/22/22 1107  Pulse: 86  SpO2: 96%  Weight: 190 lb (86.2 kg)  Height: '5\' 3"'$  (1.6 m)      Body mass index is 33.66 kg/m.    Physical Exam:     General: Well-appearing, cooperative, sitting comfortably in no acute distress.   OMT Physical Exam:  ASIS Compression Test: Positive Right Cervical: TTP paraspinal, C3 RR SL Rib: Bilateral elevated first rib with NTTP Thoracic: TTP paraspinal, T3-5 RRSL.  TTP bilateral trapezius Lumbar: TTP paraspinal, L1-3 RRSL Pelvis: Right anterior innominate  Electronically signed by:  Candice [name] D.[name] 11:42 AM 07/22/22

## 2022-07-22 ENCOUNTER — Ambulatory Visit (INDEPENDENT_AMBULATORY_CARE_PROVIDER_SITE_OTHER): Payer: BC Managed Care – PPO | Admitting: Sports Medicine

## 2022-07-22 VITALS — HR 86 | Ht 63.0 in | Wt 190.0 lb

## 2022-07-22 DIAGNOSIS — M9905 Segmental and somatic dysfunction of pelvic region: Secondary | ICD-10-CM

## 2022-07-22 DIAGNOSIS — M9903 Segmental and somatic dysfunction of lumbar region: Secondary | ICD-10-CM

## 2022-07-22 DIAGNOSIS — M9908 Segmental and somatic dysfunction of rib cage: Secondary | ICD-10-CM

## 2022-07-22 DIAGNOSIS — M25512 Pain in left shoulder: Secondary | ICD-10-CM | POA: Diagnosis not present

## 2022-07-22 DIAGNOSIS — G8929 Other chronic pain: Secondary | ICD-10-CM

## 2022-07-22 DIAGNOSIS — M9902 Segmental and somatic dysfunction of thoracic region: Secondary | ICD-10-CM

## 2022-07-22 DIAGNOSIS — M9901 Segmental and somatic dysfunction of cervical region: Secondary | ICD-10-CM | POA: Diagnosis not present

## 2022-07-22 DIAGNOSIS — M546 Pain in thoracic spine: Secondary | ICD-10-CM

## 2022-07-22 DIAGNOSIS — M7582 Other shoulder lesions, left shoulder: Secondary | ICD-10-CM | POA: Diagnosis not present

## 2022-07-22 DIAGNOSIS — M542 Cervicalgia: Secondary | ICD-10-CM

## 2022-07-22 NOTE — Patient Instructions (Signed)
Good to see you   

## 2022-07-25 NOTE — Progress Notes (Unsigned)
NEUROLOGY FOLLOW UP OFFICE NOTE  CRISTINA CENICEROS [id number]  Assessment/Plan:   Relapsing-remitting multiple sclerosis Asymptomatic HIV, well-controlled   Although her HIV is well-controlled, it would still be best to avoid anti-CD20 monoclonal antibodies.  Aubagio would be a good option Start Aubagio '14mg'$  daily Continue D3 5000 IU daily Refer to Sports Medicine (Dr. Tamala Julian or Dr. Glennon Mac) for consideration of OMT regarding upper and mid back pain After initiation of Aubagio, check hepatic panel monthly for 6 months Repeat vit D level in 6 months. Follow up in 6 months.   Time spent reviewing test results as well as face to face time with patient:  45 minutes.  I answered all questions to the best of my ability.     Subjective:  RYAN PALERMO is a 37 year old right-handed female with HIV and migraines who follows up regarding multiple sclerosis   UPDATE: Current DMT:  Rebif. Current medications:  Cabenuva (HIV), Flexeril '10mg'$  TID PRN (spasms), hydroxyzine, naproxen '220mg'$  daily PRN (pain), oxycodone-acetaminophen (pain), valacyclovir, D3 5000 IU daily  06/20/2022 LABS:  CMP with Na 140, K 3.9, Cl 105, CO2 27, Ca 9.3, glucose 85, BUN 14, Cr 0.65, t bili 0.2, ALP 46, AST 15, ALT 11.  She had side effects to Aubagio (headache, rash).  She was switched to Rebif in October.  Vision:  intermittent blurred vision.  Recent eye exam revealed mildly worse vision but no optic neuritis Motor:  weakness in arms and legs Sensory:  numbness and tingling wrapping around her abdomen Pain:  Muscle cramps. CK 107.  Pain in fingers, wrists, knees, back pain.  "MS hug" discomfort Gait:  okay Bowel/Bladder:  constipation.   Fatigue:  yes Cognition:  forgetful.  Cannot remember what she was saying during conversations.  May forget things that happened earlier that day.  Brain fog. Mood:  good. Intermittent left-sided head pressure along with intermittent blurred vision. Sleep:  Poor.   Trouble staying asleep.  Goes to bed at 9-10 PM.  Wakes up at 3 AM and cannot fall back asleep.     HISTORY: Beginning around 2018, she began having intermittent episodes of numbness, weakness and dizziness.  She was diagnosed with HIV in March 2021 and started on [place].  In May or June of that year, she began having a recurrence of symptoms, including right flank pain and numbness radiating down her right leg with some associated right leg weakness.  She felt off balance.  She saw neurology at that time.  MRI of thoracic spine revealed subtle enhancing hyperintense focus at the T8-T9 region.  Follow up MRI of brain revealed multiple scattered patchy nonenhancing hyperintense lesions within the cerebral white matter.She underwent lumbar puncture in November 2021 which revealed cell count 0, protein 23, glucose 59, negative culture, negative VDRL but >5 oligoclonal bands not in corresponding serum.  Formally diagnosed with MS.  Neurology suspected that as viral load became controlled with antiviral therapy with increased CD4 counts, she had an exacerbation.  As case reports found that treatment for HIV may provide a protective benefit on MS progression, neurology considered monitoring vs changing antiviral therpay to Atripla vs starting a low-dose interferon beta-1a. She missed her follow up appointment with neurology.  Symptoms improved once HIV became controlled.  Since early 2023, notices gradual progression of symptoms   Currently taking Cabenuva.  Viral load undetectable.  CD4 count 898.    Past DMT:  Aubagio (headaches, rash)  Past medications:  Robaxin, sertraline, tramadol, Ambien  Imaging: 01/21/2022 MRI BRAIN W WO:  Largely stable T2 and FLAIR signal affecting the cerebral hemispheric white matter with minimal involvement of the middle cerebellar peduncle on the left consistent with the clinical diagnosis of multiple sclerosis. There are a few small foci of newly seen signal in the  hemispheric white matter as marked by arrows, representing a minimal change. None of these show restricted diffusion or contrast enhancement. 01/18/2022 MRI C-SPINE W WO:  1. Stable and normal MRI appearance of the cervical spinal cord with no evidence for demyelinating disease or abnormal enhancement.  2. Mild cervical spondylosis at C5-6 with resultant mild to moderate bilateral C6 foraminal stenosis. 3. Additional mild spondylosis elsewhere within the cervical spine without significant stenosis or impingement. 01/18/2022 MRI T-SPINE W WO:  1. Stable demyelinating lesion involving the right dorsolateral cord at T8-9. No associated enhancement to suggest active demyelination.  No new lesions to suggest interval disease progression. 2. Otherwise normal MRI of the thoracic spine. 04/30/2020 MRI C-SPINE W WO:  Unremarkable MRI cervical spine (with and without).  No intrinsic, compressive or abnormal enhancing spinal cord lesions. 04/22/2020 MRI BRAIN W WO:  Multiple scattered patchy T2/FLAIR hyperintensities involving the supratentorial cerebral white matter, nonspecific, but most suspicious for possible demyelinating disease/multiple sclerosis. No evidence for active demyelination. 02/25/2020 MRI T-SPINE W WO:  1.   There is a T2 hyperintense focus with subtle enhancement adjacent to T8-T9 in the right posterolateral spinal cord. Though nonspecific, this is most likely to represent a focus of demyelination or inflammation.  Consider MRI of the brain to determine if there is evidence of demyelination elsewhere.  2.   No spinal degenerative changes. 02/11/2020 MRI L-SPINE W WO:  Central disc protrusion at L5-S1, contacting the thecal sac and S1 root sleeves as they bud from the thecal sac. Nerve compression is not demonstrated. This abnormality could result in nerve irritation.  Mild facet osteoarthritis at L4-5 and L5-S1 that could contribute to low back pain.   JCV ab positive with index of 1.59;  Quantiferon-TB Gold Plus negative   Family history:  Sister has MS.  Maternal grandmother may have had it (based on symptoms but patient unsure if she was evaluated or diagnosed)  PAST MEDICAL HISTORY: Past Medical History:  Diagnosis Date   Complication of anesthesia    epidural with last pregnancy made entire left side numb, had to d/c   Depression    Headache    HSV 06/28/2007   Hx of migraines     MEDICATIONS: Current Outpatient Medications on File Prior to Visit  Medication Sig Dispense Refill   cabotegravir & rilpivirine ER (CABENUVA) 600 & 900 MG/3ML injection Inject 1 kit into the muscle every 30 (thirty) days. 6 mL 1   cyclobenzaprine (FLEXERIL) 10 MG tablet TAKE 1 TABLET BY MOUTH THREE TIMES A DAY AS NEEDED FOR MUSCLE SPASMS 30 tablet 2   hydrOXYzine (ATARAX) 25 MG tablet Take 1-2 tablets (25-50 mg total) by mouth 3 (three) times daily as needed. 30 tablet 0   naproxen sodium (ALEVE) 220 MG tablet Take 220 mg by mouth daily as needed (For pain).     ondansetron (ZOFRAN) 4 MG tablet Take 1 tablet (4 mg total) by mouth every 8 (eight) hours as needed for nausea or vomiting. 20 tablet 0   oxyCODONE-acetaminophen (PERCOCET/ROXICET) 5-325 MG tablet Take 1 tablet by mouth every 8 (eight) hours as needed for up to 12 doses for severe pain. 12 tablet 0   psyllium (METAMUCIL) 58.6 %  packet Take 1 packet by mouth daily.     senna-docusate (SENOKOT-S) 8.6-50 MG tablet Take 1 tablet by mouth daily. 14 tablet 0   Teriflunomide 14 MG TABS Take 14 mg by mouth daily. 30 tablet 11   valACYclovir (VALTREX) 1000 MG tablet Take 1 tablet (1,000 mg total) by mouth 2 (two) times daily. 10 tablet 2   No current facility-administered medications on file prior to visit.    ALLERGIES: Allergies  Allergen Reactions   Contrast Media [Iodinated Contrast Media] Nausea And Vomiting    FAMILY HISTORY: Family History  Problem Relation Age of Onset   Alcohol abuse Mother    Drug abuse Mother     Depression Mother    Alcohol abuse Father    Drug abuse Father    Multiple sclerosis Sister    Mental illness Sister        bipolar ptsd   Schizophrenia Sister    Diabetes Maternal Grandmother    Hypertension Maternal Grandmother    Cancer Maternal Grandmother        ovarian, breast and lung   Depression Maternal Grandmother       Objective:  *** General: No acute distress.  Patient appears ***-groomed.   Head:  Normocephalic/atraumatic Eyes:  Fundi examined but not visualized Neck: supple, no paraspinal tenderness, full range of motion Heart:  Regular rate and rhythm Lungs:  Clear to auscultation bilaterally Back: No paraspinal tenderness Neurological Exam: alert and oriented to person, place, and time.  Speech fluent and not dysarthric, language intact.  CN II-XII intact. Bulk and tone normal, muscle strength 5/5 throughout.  Sensation to light touch intact.  Deep tendon reflexes 2+ throughout, toes downgoing.  Finger to nose testing intact.  Gait normal, Romberg negative.   Metta Clines, DO  CC: ***

## 2022-07-28 ENCOUNTER — Other Ambulatory Visit (INDEPENDENT_AMBULATORY_CARE_PROVIDER_SITE_OTHER): Payer: BC Managed Care – PPO

## 2022-07-28 ENCOUNTER — Encounter: Payer: Self-pay | Admitting: Neurology

## 2022-07-28 ENCOUNTER — Ambulatory Visit (INDEPENDENT_AMBULATORY_CARE_PROVIDER_SITE_OTHER): Payer: BC Managed Care – PPO | Admitting: Neurology

## 2022-07-28 VITALS — BP 107/80 | HR 87 | Ht 63.0 in | Wt 189.8 lb

## 2022-07-28 DIAGNOSIS — G35 Multiple sclerosis: Secondary | ICD-10-CM

## 2022-07-28 DIAGNOSIS — Z79899 Other long term (current) drug therapy: Secondary | ICD-10-CM

## 2022-07-28 LAB — HEPATIC FUNCTION PANEL
ALT: 12 U/L (ref 0–35)
AST: 16 U/L (ref 0–37)
Albumin: 4.2 g/dL (ref 3.5–5.2)
Alkaline Phosphatase: 43 U/L (ref 39–117)
Bilirubin, Direct: 0.1 mg/dL (ref 0.0–0.3)
Total Bilirubin: 0.5 mg/dL (ref 0.2–1.2)
Total Protein: 7.3 g/dL (ref 6.0–8.3)

## 2022-07-28 LAB — VITAMIN D 25 HYDROXY (VIT D DEFICIENCY, FRACTURES): VITD: 40.64 ng/mL (ref 30.00–100.00)

## 2022-07-28 NOTE — Patient Instructions (Addendum)
  Continue D3 5000 IU daily.  Check D level today. Repeat MRI of brain/cervical/thoracic spine with and without contrast in 6 months. Follow up in 6 months (after MRIs)

## 2022-07-29 ENCOUNTER — Other Ambulatory Visit: Payer: Self-pay

## 2022-07-29 DIAGNOSIS — Z21 Asymptomatic human immunodeficiency virus [HIV] infection status: Secondary | ICD-10-CM

## 2022-07-29 DIAGNOSIS — G35 Multiple sclerosis: Secondary | ICD-10-CM

## 2022-08-14 ENCOUNTER — Encounter: Payer: BC Managed Care – PPO | Admitting: Pharmacist

## 2022-08-15 ENCOUNTER — Encounter: Payer: Self-pay | Admitting: Pharmacist

## 2022-08-15 NOTE — Progress Notes (Deleted)
    Benito Mccreedy D.[place] Phone: [phone]   Assessment and Plan:     There are no diagnoses linked to this encounter.  ***   Pertinent previous records reviewed include ***   Follow Up: ***     Subjective:   I, Devone Tousley, am serving as a Education administrator for Doctor Glennon Mac   Chief Complaint: thoracic back pain    HPI:    02/17/2022 Patient is a 37 year old female complaining of thoracic back pain. Patient states  Beginning around 2018, she began having intermittent episodes of numbness, weakness and dizziness.  In May or June of that year, she began having a recurrence of symptoms, including right flank pain and numbness radiating down her right leg with some associated right leg weakness.  She felt off balance.  She saw neurology at that time.  MRI of thoracic spine revealed subtle enhancing hyperintense focus at the T8-T9 region.would like to discuss OMT    03/10/2022 Patient states that she is alright, left side back is still tight and shoulders    04/07/2022 Patient states that she is okay left side is still crazy even with PT   05/05/2022 Patient states she is good needs a tune up   06/02/2022 Patient states that she is pretty good    06/30/2022 Patient states left side is a problem , PT told her to re check rotator cuff   07/22/2022 Patient states she is pretty good, CSI was a life saver    08/19/2022 Patient states    Relevant Historical Information: Multiple sclerosis  Additional pertinent review of systems negative.   Current Outpatient Medications:    cabotegravir & rilpivirine ER (CABENUVA) 600 & 900 MG/3ML injection, Inject 1 kit into the muscle every 30 (thirty) days., Disp: 6 mL, Rfl: 1   cyclobenzaprine (FLEXERIL) 10 MG tablet, TAKE 1 TABLET BY MOUTH THREE TIMES A DAY AS NEEDED FOR MUSCLE SPASMS, Disp: 30 tablet, Rfl: 2   hydrOXYzine (ATARAX) 25 MG tablet, Take 1-2  tablets (25-50 mg total) by mouth 3 (three) times daily as needed., Disp: 30 tablet, Rfl: 0   naproxen sodium (ALEVE) 220 MG tablet, Take 220 mg by mouth daily as needed (For pain)., Disp: , Rfl:    psyllium (METAMUCIL) 58.6 % packet, Take 1 packet by mouth daily., Disp: , Rfl:    valACYclovir (VALTREX) 1000 MG tablet, Take 1 tablet (1,000 mg total) by mouth 2 (two) times daily., Disp: 10 tablet, Rfl: 2   Objective:     There were no vitals filed for this visit.    There is no height or weight on file to calculate BMI.    Physical Exam:    ***   Electronically signed by:  Benito Mccreedy D.[name] 8:06 AM 08/15/22

## 2022-08-16 ENCOUNTER — Other Ambulatory Visit: Payer: BC Managed Care – PPO

## 2022-08-19 ENCOUNTER — Ambulatory Visit: Payer: BC Managed Care – PPO | Admitting: Sports Medicine

## 2022-08-19 ENCOUNTER — Other Ambulatory Visit: Payer: BC Managed Care – PPO

## 2022-08-22 ENCOUNTER — Ambulatory Visit (INDEPENDENT_AMBULATORY_CARE_PROVIDER_SITE_OTHER): Payer: BC Managed Care – PPO | Admitting: Pharmacist

## 2022-08-22 ENCOUNTER — Other Ambulatory Visit: Payer: Self-pay

## 2022-08-22 DIAGNOSIS — Z21 Asymptomatic human immunodeficiency virus [HIV] infection status: Secondary | ICD-10-CM | POA: Diagnosis not present

## 2022-08-22 MED ORDER — CABOTEGRAVIR & RILPIVIRINE ER 600 & 900 MG/3ML IM SUER
1.0000 | Freq: Once | INTRAMUSCULAR | Status: AC
  Administered 2022-08-22: 1 via INTRAMUSCULAR

## 2022-08-22 NOTE — Progress Notes (Signed)
HPI: Candice [name] is a 37 y.o. female who presents to the [facility] for [place] administration.  Patient Active Problem List   Diagnosis Date Noted   Vitamin D deficiency 06/20/2022   Constipation 02/18/2022   Arthralgia 02/18/2022   Depression, major, single episode, mild ([place]) 01/13/2022   Cyst of vulva 09/19/2021   Frequent urination 09/11/2021   UTI symptoms 07/30/2021   Medication monitoring encounter 01/11/2021   MS (multiple sclerosis) ([place]) 05/21/2020   GERD (gastroesophageal reflux disease) 05/21/2020   Right-sided abdominal pain of unknown etiology 04/06/2020   Abnormal MRI, spine 04/06/2020   Metatarsalgia of right foot 04/06/2020   Healthcare maintenance 02/10/2020   Right leg numbness 02/07/2020   HIV (human immunodeficiency virus infection) ([place]) 09/07/2019   Hemorrhagic cyst of left ovary 10/14/2016   Abdominal pain 04/16/2016   MRSA colonization 09/25/2014   Major depressive disorder, recurrent episode, moderate ([place]) 06/08/2013   Genital herpes 02/16/2013   Back pain 10/13/2011   Vaginal discharge 10/10/2010   Cervical cancer screening 10/10/2010   OBESITY, NOS 08/20/2006    Patient's Medications  New Prescriptions   No medications on file  Previous Medications   CABOTEGRAVIR & RILPIVIRINE ER (CABENUVA) 600 & 900 MG/3ML INJECTION    Inject 1 kit into the muscle every 30 (thirty) days.   CYCLOBENZAPRINE (FLEXERIL) 10 MG TABLET    TAKE 1 TABLET BY MOUTH THREE TIMES A DAY AS NEEDED FOR MUSCLE SPASMS   HYDROXYZINE (ATARAX) 25 MG TABLET    Take 1-2 tablets (25-50 mg total) by mouth 3 (three) times daily as needed.   NAPROXEN SODIUM (ALEVE) 220 MG TABLET    Take 220 mg by mouth daily as needed (For pain).   PSYLLIUM (METAMUCIL) 58.6 % PACKET    Take 1 packet by mouth daily.   VALACYCLOVIR (VALTREX) 1000 MG TABLET    Take 1 tablet (1,000 mg total) by mouth 2 (two) times daily.  Modified Medications   No medications on file  Discontinued  Medications   No medications on file    Allergies: Allergies  Allergen Reactions   Contrast Media [Iodinated Contrast Media] Nausea And Vomiting    Past Medical History: Past Medical History:  Diagnosis Date   Complication of anesthesia    epidural with last pregnancy made entire left side numb, had to d/c   Depression    Headache    HSV 06/28/2007   Hx of migraines     Social History: Social History   Socioeconomic History   Marital status: Single    Spouse name: Not on file   Number of children: 2   Years of education: 14   Highest education level: Not on file  Occupational History    Comment: Ecologist  Tobacco Use   Smoking status: Every Day    Packs/day: 0.25    Types: Cigarettes   Smokeless tobacco: Never   Tobacco comments:    States she has thought about quitting  Vaping Use   Vaping Use: Never used  Substance and Sexual Activity   Alcohol use: Yes    Comment: occasional   Drug use: Yes    Frequency: 7.0 times per week    Types: Marijuana    Comment: States daily use   Sexual activity: Yes    Partners: Male    Birth control/protection: None    Comment: Decline condoms  Other Topics Concern   Not on file  Social History Narrative   Works as Manufacturing engineer  Manager.   FOB of both children lives in [place]; will be some what involved but mother is no longer dating him.   Social Determinants of Health   Financial Resource Strain: Not on file  Food Insecurity: Not on file  Transportation Needs: Not on file  Physical Activity: Not on file  Stress: Not on file  Social Connections: Not on file    Labs: Lab Results  Component Value Date   HIV1RNAQUANT <20 (H) 06/20/2022   HIV1RNAQUANT Not Detected 02/14/2022   HIV1RNAQUANT Not Detected 12/13/2021   CD4TABS 1,026 11/22/2020   CD4TABS 1,078 03/29/2020   CD4TABS 1,233 02/10/2020    RPR and STI Lab Results  Component Value Date   LABRPR NON-REACTIVE 12/13/2021   LABRPR NON-REACTIVE 06/20/2021    LABRPR Non Reactive 10/25/2020   LABRPR NON-REACTIVE 09/08/2019   LABRPR Non Reactive 09/05/2019    STI Results GC CT  02/14/2022  9:13 AM Negative    Negative  Negative    Negative   12/13/2021  9:09 AM Negative    Negative  Negative    Negative   07/29/2021  4:28 PM Negative  Negative   06/20/2021  9:20 AM Negative  Negative   12/21/2020  3:45 PM Negative  Negative   11/22/2020  9:58 AM Negative  Negative   10/25/2020  9:52 AM Negative  Negative   09/05/2019  4:31 PM Negative  Negative   10/16/2017 12:00 AM Negative  Negative   06/08/2017 12:00 AM Negative  Negative   10/14/2016 12:00 AM Negative  Negative   02/15/2016 12:00 AM Negative  Negative   07/13/2015 12:00 AM Negative  Negative   04/13/2015 12:00 AM Negative  Negative   09/24/2013 12:00 AM NG: Negative  CT: Negative   02/11/2011  3:36 PM  NEGATIVE   01/29/2010  7:00 PM  NEGATIVE   05/02/2009  8:17 PM  NEGATIVE   09/29/2008  9:16 PM  NEGATIVE   10/12/2007 10:55 PM  NEGATIVE     Hepatitis B Lab Results  Component Value Date   HEPBSAB REACTIVE (A) 09/08/2019   HEPBSAG NON-REACTIVE 01/08/2022   HEPBCAB NON-REACTIVE 09/08/2019   Hepatitis C Lab Results  Component Value Date   HEPCAB NON-REACTIVE 09/08/2019   Hepatitis A Lab Results  Component Value Date   HAV BORDERLINE (A) 09/08/2019   Lipids: Lab Results  Component Value Date   CHOL 183 11/22/2020   TRIG 80 11/22/2020   HDL 51 11/22/2020   CHOLHDL 3.6 11/22/2020   VLDL 10 02/14/2013   LDLCALC 115 (H) 11/22/2020    TARGET DATE:  The 25th of the month  Current HIV Regimen: Cabenuva  Assessment: Aljean presents today for their maintenance Cabenuva injections. Initial/past injections were tolerated well without issues. No problems with systemic effects of injections.   Administered cabotegravir '600mg'$ /26m in left upper outer quadrant of the gluteal muscle. Administered rilpivirine 900 mg/330min the right upper outer quadrant of the gluteal  muscle. Monitored patient for 10 minutes after injection. Injections were tolerated well without issue. Patient will follow up in 2 months for next injection. Patient's last viral load was undetectable in December so will defer any lab tests today. She politely declines STI testing stating she is not active at this time.   Plan: - Cabenuva injections administered - Next injections scheduled for 4/26 with me  - Call with any issues or questions  AmAlfonse SprucePharmD, CPP, BCIDP, AAHarwoodlinical Pharmacist Practitioner [facility]

## 2022-08-29 ENCOUNTER — Other Ambulatory Visit (HOSPITAL_COMMUNITY)
Admission: RE | Admit: 2022-08-29 | Discharge: 2022-08-29 | Disposition: A | Discharge status: Home / Self Care | Payer: BC Managed Care – PPO | Source: Ambulatory Visit | Attending: Family Medicine | Admitting: Family Medicine

## 2022-08-29 ENCOUNTER — Encounter: Payer: Self-pay | Admitting: Student

## 2022-08-29 ENCOUNTER — Ambulatory Visit (INDEPENDENT_AMBULATORY_CARE_PROVIDER_SITE_OTHER): Payer: BC Managed Care – PPO | Admitting: Student

## 2022-08-29 VITALS — BP 102/70 | HR 92 | Ht 63.0 in | Wt 189.0 lb

## 2022-08-29 DIAGNOSIS — R102 Pelvic and perineal pain: Secondary | ICD-10-CM | POA: Insufficient documentation

## 2022-08-29 DIAGNOSIS — R399 Unspecified symptoms and signs involving the genitourinary system: Secondary | ICD-10-CM

## 2022-08-29 LAB — POCT UA - MICROSCOPIC ONLY
Bacteria, U Microscopic: NONE SEEN
WBC, Ur, HPF, POC: NONE SEEN (ref 0–5)

## 2022-08-29 LAB — POCT URINALYSIS DIP (MANUAL ENTRY)
Bilirubin, UA: NEGATIVE
Glucose, UA: NEGATIVE mg/dL
Ketones, POC UA: NEGATIVE mg/dL
Leukocytes, UA: NEGATIVE
Nitrite, UA: NEGATIVE
Protein Ur, POC: NEGATIVE mg/dL
Spec Grav, UA: 1.015 (ref 1.010–1.025)
Urobilinogen, UA: 0.2 E.U./dL
pH, UA: 7 (ref 5.0–8.0)

## 2022-08-29 LAB — POCT WET PREP (WET MOUNT)
Clue Cells Wet Prep Whiff POC: POSITIVE
Trichomonas Wet Prep HPF POC: ABSENT

## 2022-08-29 NOTE — Progress Notes (Signed)
SUBJECTIVE:   CHIEF COMPLAINT / HPI:   Urinary and diarrheal symptoms  Has been peeing every hour since 1 am, and also started having bowel movements around 7 am. Last BM this afternoon had mucous and a small bit of blood. Reports she was having full volumes of urine, every time she peed, and is now appreciating cramping at baseline. Stools become soft but not watery, with last stool having more mucous than stool. She was originally constipated from MS medications, took shot last Friday. Notes she has had some nausea, but no vomiting, but a little bit of reflux. Notes some sweating and chills before one of her BM, but no fever. No pain with defecation or urination.  Reports that when her MS flares she has constipation (from MS meds) followed by numbness in her pelvic area (vagina and rectum). MS flares usually look like back weakness and spasm, lasting for about 2 weeks, but it takes 2 months until feeling normal.   Was sexually active 2 days ago for the first time in 9 months, used a condom once and once without it. No discharge, itching/burning.   MS meds have caused joint pain/hands locking up, and bruising in legs.     PERTINENT  PMH / PSH: MS, HIV  Patient Care Team: Precious Gilding, DO as PCP - General (Family Medicine) OBJECTIVE:  BP 102/70   Pulse 92   Ht '5\' 3"'$  (1.6 m)   Wt 189 lb (85.7 kg)   LMP 08/09/2022   SpO2 98%   BMI 33.48 kg/m  Physical Exam Exam conducted with a chaperone present.  Constitutional:      General: She is not in acute distress.    Appearance: Normal appearance.  Abdominal:     General: There is no distension.     Palpations: Abdomen is soft. There is no mass.     Tenderness: There is abdominal tenderness (TTP in suprapubic, lower abdomen). There is no guarding.  Genitourinary:    General: Normal vulva.     Pubic Area: No rash.      Labia:        Right: No rash or lesion.        Left: No rash or lesion.      Vagina: No signs of injury. Vaginal  discharge present. No erythema, tenderness, bleeding or lesions.     Cervix: No cervical motion tenderness, discharge, lesion, erythema or cervical bleeding.  Neurological:     Mental Status: She is alert.      ASSESSMENT/PLAN:  UTI symptoms Assessment & Plan: Patient reports increased urinary frequency starting at 1 am today. She has been peeing nearly every hour, with no pain, or irritation. Symptoms started suddenly and seem to be triggered by her drinking fluids. Patient recently sexually active and had unprotected sex. Considered UTI, but UA was negative. Given that patient has hx of MS and reported issues in pelvic region w/ MS sometimes, numbness in vaginal/pelvic/rectal area, also considered extenuating issue from [place]. Report's her doctor's noted that her MS could lead to pelvic issues in the future.  -Symptoms possibly related to MS  Orders: -     POCT urinalysis dipstick -     POCT UA - Microscopic Only  Pelvic pain Assessment & Plan: Patient appreciates pelvic pain that started today with her urinary symptoms, and also report's multiple soft stools, w/ mucous and faint blood noted in most recent. Pelvic exam normal, w/ small amount of discharge, no cervical motion tenderness. Will test  for STI as patient w/ hx of MS and HIV w/ recent unprotected intercourse. Pelvic pain could be coming from STI or PID, but less likely given normal pelvic exam. Also considered gastroenteritis, given GI symptoms possibly causing referred pelvic pain. Will test for STI's and if negative, most likely 2/2 gastroenteritis. If symptoms persist passed a week, more concern for MS issue (see UTI assessment), or other cause.  -Pelvic exam and STI testing (GC/Chlamydia/candida/trich) -If all test negative and symptoms continue, return in 1 week -Return precautions given  Orders: -     POCT [facility]) -     Cervicovaginal ancillary only   No follow-ups on file. Holley Bouche, MD 08/29/2022, 6:19  PM PGY-2, [place]

## 2022-08-29 NOTE — Assessment & Plan Note (Addendum)
Patient appreciates pelvic pain that started today with her urinary symptoms, and also report's multiple soft stools, w/ mucous and faint blood noted in most recent. Pelvic exam normal, w/ small amount of discharge, no cervical motion tenderness. Will test for STI as patient w/ hx of MS and HIV w/ recent unprotected intercourse. Pelvic pain could be coming from STI or PID, but less likely given normal pelvic exam. Also considered gastroenteritis, given GI symptoms possibly causing referred pelvic pain. Will test for STI's and if negative, most likely 2/2 gastroenteritis. If symptoms persist passed a week, more concern for MS issue (see UTI assessment), or other cause.  -Pelvic exam and STI testing (GC/Chlamydia/candida/trich) -If all test negative and symptoms continue, return in 1 week -Return precautions given

## 2022-08-29 NOTE — Assessment & Plan Note (Signed)
Patient reports increased urinary frequency starting at 1 am today. She has been peeing nearly every hour, with no pain, or irritation. Symptoms started suddenly and seem to be triggered by her drinking fluids. Patient recently sexually active and had unprotected sex. Considered UTI, but UA was negative. Given that patient has hx of MS and reported issues in pelvic region w/ MS sometimes, numbness in vaginal/pelvic/rectal area, also considered extenuating issue from [place]. Report's her doctor's noted that her MS could lead to pelvic issues in the future.  -Symptoms possibly related to MS

## 2022-08-29 NOTE — Patient Instructions (Signed)
It was great to see you! Thank you for allowing me to participate in your care!  It looks like your symptoms may be coming from a few different possibilities. You could have a Urinary Tract Infection (UTI), and we will test you for this. You could also have an Sexually Transmitted Infection (STI) we will test you for this. OR this could be coming from your MS medication/flare. This could also be a bad case of viral gastroenteritis (GI Bug).   Our plans for today:  - UTI testing - STI testing  Results should return back later today or early next week. I will likely call you with results. Please stay hydrated and continue to drink, even if it is causing you to pee frequently. We do not want you getting dehydrated.   IF test come back positive, we will treat you and symptoms should improve.   IF test come back negative, it raises the likelihood that this is a GI Bug.   IF symptoms persist for greater than a week, higher concern for issues w/ MS meds or MS Flare  Seek Immediate Medical care if: You are feeling light headed/dizzy You develop fevers and diarrhea You develop large volumes of blood in stool   Take care and seek immediate care sooner if you develop any concerns.   Dr. Holley Bouche, MD [place]

## 2022-08-31 ENCOUNTER — Other Ambulatory Visit: Payer: Self-pay | Admitting: Family

## 2022-09-01 LAB — CERVICOVAGINAL ANCILLARY ONLY
Chlamydia: NEGATIVE
Comment: NEGATIVE
Comment: NORMAL
Neisseria Gonorrhea: NEGATIVE

## 2022-09-02 ENCOUNTER — Other Ambulatory Visit: Payer: Self-pay | Admitting: Student

## 2022-09-02 MED ORDER — METRONIDAZOLE 500 MG PO TABS: 500.0000 mg | ORAL_TABLET | Freq: Two times a day (BID) | ORAL | 0 refills | Status: DC

## 2022-09-05 NOTE — Progress Notes (Signed)
Yes! I sent in a Rx for flagyl on the 12th

## 2022-09-08 NOTE — Progress Notes (Unsigned)
    Benito Mccreedy D.[place] Phone: [phone]   Assessment and Plan:     There are no diagnoses linked to this encounter.  ***   Pertinent previous records reviewed include ***   Follow Up: ***     Subjective:   I, Tyshun Tuckerman, am serving as a Education administrator for Doctor Glennon Mac   Chief Complaint: thoracic back pain    HPI:    02/17/2022 Patient is a 37 year old female complaining of thoracic back pain. Patient states  Beginning around 2018, she began having intermittent episodes of numbness, weakness and dizziness.  In May or June of that year, she began having a recurrence of symptoms, including right flank pain and numbness radiating down her right leg with some associated right leg weakness.  She felt off balance.  She saw neurology at that time.  MRI of thoracic spine revealed subtle enhancing hyperintense focus at the T8-T9 region.would like to discuss OMT    03/10/2022 Patient states that she is alright, left side back is still tight and shoulders    04/07/2022 Patient states that she is okay left side is still crazy even with PT   05/05/2022 Patient states she is good needs a tune up   06/02/2022 Patient states that she is pretty good    06/30/2022 Patient states left side is a problem , PT told her to re check rotator cuff   07/22/2022 Patient states she is pretty good, CSI was a life saver    09/09/2022 Patient states   Relevant Historical Information: Multiple sclerosis  Additional pertinent review of systems negative.   Current Outpatient Medications:    cabotegravir & rilpivirine ER (CABENUVA) 600 & 900 MG/3ML injection, Inject 1 kit into the muscle every 30 (thirty) days., Disp: 6 mL, Rfl: 1   cyclobenzaprine (FLEXERIL) 10 MG tablet, TAKE 1 TABLET BY MOUTH THREE TIMES A DAY AS NEEDED FOR MUSCLE SPASMS, Disp: 30 tablet, Rfl: 2   hydrOXYzine (ATARAX) 25 MG tablet, Take 1-2  tablets (25-50 mg total) by mouth 3 (three) times daily as needed., Disp: 30 tablet, Rfl: 0   metroNIDAZOLE (FLAGYL) 500 MG tablet, Take 1 tablet (500 mg total) by mouth 2 (two) times daily., Disp: 14 tablet, Rfl: 0   naproxen sodium (ALEVE) 220 MG tablet, Take 220 mg by mouth daily as needed (For pain)., Disp: , Rfl:    psyllium (METAMUCIL) 58.6 % packet, Take 1 packet by mouth daily., Disp: , Rfl:    valACYclovir (VALTREX) 1000 MG tablet, TAKE 1 TABLET BY MOUTH TWICE A DAY, Disp: 10 tablet, Rfl: 2   Objective:     There were no vitals filed for this visit.    There is no height or weight on file to calculate BMI.    Physical Exam:    ***   Electronically signed by:  Benito Mccreedy D.[name] 10:19 AM 09/08/22

## 2022-09-09 ENCOUNTER — Ambulatory Visit (INDEPENDENT_AMBULATORY_CARE_PROVIDER_SITE_OTHER): Payer: BC Managed Care – PPO | Admitting: Sports Medicine

## 2022-09-09 VITALS — BP 122/80 | HR 111 | Ht 63.0 in | Wt 189.0 lb

## 2022-09-09 DIAGNOSIS — M9908 Segmental and somatic dysfunction of rib cage: Secondary | ICD-10-CM | POA: Diagnosis not present

## 2022-09-09 DIAGNOSIS — M9902 Segmental and somatic dysfunction of thoracic region: Secondary | ICD-10-CM

## 2022-09-09 DIAGNOSIS — M546 Pain in thoracic spine: Secondary | ICD-10-CM

## 2022-09-09 DIAGNOSIS — M9901 Segmental and somatic dysfunction of cervical region: Secondary | ICD-10-CM | POA: Diagnosis not present

## 2022-09-09 DIAGNOSIS — M542 Cervicalgia: Secondary | ICD-10-CM

## 2022-09-09 DIAGNOSIS — G8929 Other chronic pain: Secondary | ICD-10-CM

## 2022-09-09 NOTE — Patient Instructions (Addendum)
Good to see you  Neck and trap HEP 4 week follow up

## 2022-09-09 NOTE — Progress Notes (Unsigned)
  SUBJECTIVE:   CHIEF COMPLAINT / HPI:   Pregnancy test: Positive at home. Not planned but desired.  Presents with father of baby, Candice [name].  Patient has been adherent with her HIV medication although disclosed that Candice [name] does not know about HIV status.  She requests that I break the news to him rather than telling him directly. Patient was apologetic in room after news was discussed with him separately.  LMP 08/09/2022.  PERTINENT  PMH / PSH: MS, HIV  Patient Care Team: Precious Gilding, DO as PCP - General (Family Medicine) OBJECTIVE:  BP 120/70   Pulse (!) 108   Wt 185 lb (83.9 kg)   LMP 08/09/2022   SpO2 98%   BMI 32.77 kg/m  General: Well-appearing, NAD Psych: Normal mood and affect  ASSESSMENT/PLAN:  Positive pregnancy test Assessment & Plan: Positive pregnancy.  Return in 1 week to discuss next steps given she is likely high risk with a history of HIV and MS.  Additionally, she will need to discuss with her partner about desires for keeping pregnancy.  Obtain new OB blood work at next visit in addition to [facility] referral if desired to keep pregnancy.  As requested by patient, I discussed with her partner a history of positive HIV although appropriate medication adherence.  Her last HIV RNA levels were detectable at decreased levels, prior to that were not detected.  I advised that he has an HIV exposure and I would recommend testing and consideration for postexposure prophylaxis if time range is still appropriate.  He does not have a PCP, I have made him an appointment to be seen in our clinic this afternoon.  Given time constraints, it may be appropriate for visits only be related to HIV exposure and he should return to see me for further management and new patient paperwork.  Orders: -     POCT urine pregnancy  Return in about 1 week (around 09/17/2022) for pregnancy f/u. Candice Guiles, DO 09/10/2022, 12:08 PM PGY-2, [place]

## 2022-09-10 ENCOUNTER — Ambulatory Visit (INDEPENDENT_AMBULATORY_CARE_PROVIDER_SITE_OTHER): Payer: BC Managed Care – PPO | Admitting: Student

## 2022-09-10 ENCOUNTER — Encounter: Payer: Self-pay | Admitting: Student

## 2022-09-10 VITALS — BP 120/70 | HR 108 | Wt 185.0 lb

## 2022-09-10 DIAGNOSIS — Z3201 Encounter for pregnancy test, result positive: Secondary | ICD-10-CM | POA: Insufficient documentation

## 2022-09-10 LAB — POCT URINE PREGNANCY: Preg Test, Ur: POSITIVE — AB

## 2022-09-10 NOTE — Assessment & Plan Note (Signed)
Positive pregnancy.  Return in 1 week to discuss next steps given she is likely high risk with a history of HIV and MS.  Additionally, she will need to discuss with her partner about desires for keeping pregnancy.  Obtain new OB blood work at next visit in addition to [facility] referral if desired to keep pregnancy.  As requested by patient, I discussed with her partner a history of positive HIV although appropriate medication adherence.  Her last HIV RNA levels were detectable at decreased levels, prior to that were not detected.  I advised that he has an HIV exposure and I would recommend testing and consideration for postexposure prophylaxis if time range is still appropriate.  He does not have a PCP, I have made him an appointment to be seen in our clinic this afternoon.  Given time constraints, it may be appropriate for visits only be related to HIV exposure and he should return to see me for further management and new patient paperwork.

## 2022-09-11 ENCOUNTER — Ambulatory Visit (INDEPENDENT_AMBULATORY_CARE_PROVIDER_SITE_OTHER): Payer: BC Managed Care – PPO | Admitting: Family

## 2022-09-11 ENCOUNTER — Encounter: Payer: Self-pay | Admitting: Family

## 2022-09-11 ENCOUNTER — Other Ambulatory Visit: Payer: Self-pay

## 2022-09-11 VITALS — BP 116/79 | HR 104 | Temp 98.1°F | Wt 185.0 lb

## 2022-09-11 DIAGNOSIS — O98711 Human immunodeficiency virus [HIV] disease complicating pregnancy, first trimester: Secondary | ICD-10-CM

## 2022-09-11 DIAGNOSIS — Z21 Asymptomatic human immunodeficiency virus [HIV] infection status: Secondary | ICD-10-CM

## 2022-09-11 DIAGNOSIS — B20 Human immunodeficiency virus [HIV] disease: Secondary | ICD-10-CM | POA: Diagnosis not present

## 2022-09-11 DIAGNOSIS — Z3A01 Less than 8 weeks gestation of pregnancy: Secondary | ICD-10-CM

## 2022-09-11 DIAGNOSIS — O0991 Supervision of high risk pregnancy, unspecified, first trimester: Secondary | ICD-10-CM | POA: Insufficient documentation

## 2022-09-11 NOTE — Progress Notes (Signed)
Brief Narrative   Patient ID: Candice [name], female    DOB: [birth year], 37 y.o.   MRN: [id number]  Ms. Frydrych is a 37 y/o female diagnosed with HIV-1 disease diagnosed in March 2021 with risk factor of heterosexual contact. Initial CD4 count was 757 and viral load of 421. Genotype was Subtype B with no significant medication resistant mutations. No history of opportunistic infection. [id number] negative. Entered care at [facility] Stage 1. Previous ART exposure to [employer].   Subjective:    Chief Complaint  Patient presents with   Follow-up    HPI:  Candice [name] is a 37 y.o. female with HIV disease and multiple sclerosis last seen on 08/22/22 by Alfonse Spruce, PharmD, CPP with well controlled virus with good adherence and tolerance to [place]. Viral load was undetectable with CD4 count of 1071. Seen by [facility] yesterday with positive pregnancy test and here today for follow up.  Kaory has been doing okay since her last office visit. She had a missed period which triggered her to take a home pregnancy test that was positive. Her last menstural cycle started on February 17. Received her last dose of Cabenuva on 08/22/22. Taking Flinstones vitamins. She expresses concern over events of office visit yesterday where the provider informed her that she was required to tell her partner about her HIV status which was previously not disclosed. Has upcoming appointment with OB to establish care.  Denies fevers, chills, night sweats, headaches, changes in vision, neck pain/stiffness, nausea, diarrhea, vomiting, lesions or rashes.   Allergies  Allergen Reactions   Contrast Media [Iodinated Contrast Media] Nausea And Vomiting      Outpatient Medications Prior to Visit  Medication Sig Dispense Refill   cabotegravir & rilpivirine ER (CABENUVA) 600 & 900 MG/3ML injection Inject 1 kit into the muscle every 30 (thirty) days. 6 mL 1   cyclobenzaprine (FLEXERIL) 10 MG tablet Take by mouth.      fluticasone (FLONASE) 50 MCG/ACT nasal spray 1 spray.     valACYclovir (VALTREX) 1000 MG tablet TAKE 1 TABLET BY MOUTH TWICE A DAY 10 tablet 2   No facility-administered medications prior to visit.     Past Medical History:  Diagnosis Date   Complication of anesthesia    epidural with last pregnancy made entire left side numb, had to d/c   Depression    Headache    HSV 06/28/2007   Hx of migraines      Past Surgical History:  Procedure Laterality Date   BREAST SURGERY  2019   reduction   CESAREAN SECTION     CESAREAN SECTION  09/07/2011   Procedure: CESAREAN SECTION;  Surgeon: Florian Buff, MD;  Location: [facility];  Service: Gynecology;  Laterality: N/A;  Repeat cesarean section with delivery of baby boy at 11. Apgars 9/9.  Bilateral tubal ligation.   IUD REMOVAL  2005   in cervix   TUBAL LIGATION     tubal reanastomosis  08/13/2016      Review of Systems  Constitutional:  Negative for appetite change, chills, diaphoresis, fatigue, fever and unexpected weight change.  Eyes:        Negative for acute change in vision  Respiratory:  Negative for chest tightness, shortness of breath and wheezing.   Cardiovascular:  Negative for chest pain.  Gastrointestinal:  Negative for diarrhea, nausea and vomiting.  Genitourinary:  Negative for dysuria, pelvic pain and vaginal discharge.  Musculoskeletal:  Negative for neck pain and neck stiffness.  Skin:  Negative for rash.  Neurological:  Negative for seizures, syncope, weakness and headaches.  Hematological:  Negative for adenopathy. Does not bruise/bleed easily.  Psychiatric/Behavioral:  Negative for hallucinations.       Objective:    BP 116/79   Pulse (!) 104   Temp 98.1 F (36.7 C) (Oral)   Wt 185 lb (83.9 kg)   LMP 08/09/2022   SpO2 100%   BMI 32.77 kg/m  Nursing note and vital signs reviewed.  Physical Exam Constitutional:      General: She is not in acute distress.    Appearance: She is well-developed.   Eyes:     Conjunctiva/sclera: Conjunctivae normal.  Cardiovascular:     Rate and Rhythm: Normal rate and regular rhythm.     Heart sounds: Normal heart sounds. No murmur heard.    No friction rub. No gallop.  Pulmonary:     Effort: Pulmonary effort is normal. No respiratory distress.     Breath sounds: Normal breath sounds. No wheezing or rales.  Chest:     Chest wall: No tenderness.  Abdominal:     General: Bowel sounds are normal.     Palpations: Abdomen is soft.     Tenderness: There is no abdominal tenderness.  Musculoskeletal:     Cervical back: Neck supple.  Lymphadenopathy:     Cervical: No cervical adenopathy.  Skin:    General: Skin is warm and dry.     Findings: No rash.  Neurological:     Mental Status: She is alert and oriented to person, place, and time.  Psychiatric:        Behavior: Behavior normal.        Thought Content: Thought content normal.        Judgment: Judgment normal.         09/11/2022    9:22 AM 09/10/2022   10:50 AM 08/29/2022    2:51 PM 06/20/2022   10:29 AM 02/18/2022    8:58 AM  Depression screen PHQ 2/9  Decreased Interest 0 1 0 0 1  Down, Depressed, Hopeless 0 0 0 0 1  PHQ - 2 Score 0 1 0 0 2  Altered sleeping  2 1  1   Tired, decreased energy  3 1  1   Change in appetite  2 1  1   Feeling bad or failure about yourself   0 0  0  Trouble concentrating  0 0  0  Moving slowly or fidgety/restless  0 0  0  Suicidal thoughts  0 0  0  PHQ-9 Score  8 3  5        Assessment & Plan:    Patient Active Problem List   Diagnosis Date Noted   High-risk pregnancy in first trimester 09/11/2022   Positive pregnancy test 09/10/2022   Pelvic pain 08/29/2022   Vitamin D deficiency 06/20/2022   Constipation 02/18/2022   Arthralgia 02/18/2022   Depression, major, single episode, mild ([place]) 01/13/2022   Cyst of vulva 09/19/2021   Frequent urination 09/11/2021   UTI symptoms 07/30/2021   Medication monitoring encounter 01/11/2021   MS (multiple  sclerosis) ([place]) 05/21/2020   GERD (gastroesophageal reflux disease) 05/21/2020   Right-sided abdominal pain of unknown etiology 04/06/2020   Abnormal MRI, spine 04/06/2020   Metatarsalgia of right foot 04/06/2020   Healthcare maintenance 02/10/2020   Right leg numbness 02/07/2020   HIV (human immunodeficiency virus infection) ([place]) 09/07/2019   Hemorrhagic cyst of left ovary 10/14/2016   Abdominal  pain 04/16/2016   MRSA colonization 09/25/2014   Major depressive disorder, recurrent episode, moderate ([place]) 06/08/2013   Genital herpes 02/16/2013   Back pain 10/13/2011   Vaginal discharge 10/10/2010   Cervical cancer screening 10/10/2010   OBESITY, NOS 08/20/2006     Problem List Items Addressed This Visit       Other   HIV (human immunodeficiency virus infection) ([place]) - Primary    Mariangel continues to have well controlled virus with good adherence and tolerance to [place]. Reviewed previous lab work and discussed HIV and pregnancy with the goal of preventing transmission to her fetus. It was completely inappropriate of the provider to force/threaten her disclosure of her status to her partner as she has been undetectable for more than 6 months and in accordance with [place] Law she is not required to disclose her status. In addition the office notes indicate that she had a small viral load which again is incorrect and she is completely undetectable. We discussed medication options during pregnancy with the risk of continued use of Cabenuva with no data to support use in pregnancy vs the standard of changing to oral medications. Granted the [place] will remain in her system for the duration of therapy in either case. She will take time to think about this. Discussed with pharmacy team and will consider changing to monthly injections starting in the second trimester should she decide to continue with Cabenuva. Plan for follow up in 3 months or sooner if needed.       Relevant Orders   BASIC  METABOLIC PANEL WITH GFR (Completed)   HIV-1 RNA quant-no reflex-bld   T-helper cell (CD4)- ([facility] clinic only) (Completed)   hCG, serum, qualitative (Completed)   High-risk pregnancy in first trimester    Rozalynn is newly pregnant with POCT urine test yesterday and will check beta Hcg serum today. Advised to start pre-natal vitamin as current vitamins do not contain all recommended vitamins/minerals. She is about [redacted] weeks along with estimated date of confinement being 05/16/23. Has follow up with OB to establish continued care.       Relevant Orders   BASIC METABOLIC PANEL WITH GFR (Completed)   HIV-1 RNA quant-no reflex-bld   T-helper cell (CD4)- ([facility] clinic only) (Completed)   hCG, serum, qualitative (Completed)     I am having Khalaya LThurnell Garbe maintain her cabotegravir & rilpivirine ER, valACYclovir, cyclobenzaprine, and fluticasone.   No orders of the defined types were placed in this encounter.    Follow-up: Return in about 2 months (around 11/11/2022), or if symptoms worsen or fail to improve.   Terri Piedra, MSN, FNP-C Nurse Practitioner [facility] for Infectious Disease [place] number: [phone]

## 2022-09-11 NOTE — Patient Instructions (Signed)
Nice to see you.  We will check your lab work today.  Plan for follow up in 2 months or sooner if needed with lab work on the same day.  Have a great day and stay safe! 

## 2022-09-11 NOTE — Assessment & Plan Note (Signed)
Candice [name] is newly pregnant with POCT urine test yesterday and will check beta Hcg serum today. Advised to start pre-natal vitamin as current vitamins do not contain all recommended vitamins/minerals. She is about [redacted] weeks along with estimated date of confinement being 05/16/23. Has follow up with OB to establish continued care.

## 2022-09-11 NOTE — Assessment & Plan Note (Addendum)
Adrain continues to have well controlled virus with good adherence and tolerance to [place]. Reviewed previous lab work and discussed HIV and pregnancy with the goal of preventing transmission to her fetus. It was completely inappropriate of the provider to force/threaten her disclosure of her status to her partner as she has been undetectable for more than 6 months and in accordance with [place] Law she is not required to disclose her status. In addition the office notes indicate that she had a small viral load which again is incorrect and she is completely undetectable. We discussed medication options during pregnancy with the risk of continued use of Cabenuva with no data to support use in pregnancy vs the standard of changing to oral medications. Granted the [place] will remain in her system for the duration of therapy in either case. She will take time to think about this. Discussed with pharmacy team and will consider changing to monthly injections starting in the second trimester should she decide to continue with Cabenuva. Plan for follow up in 3 months or sooner if needed.

## 2022-09-12 LAB — T-HELPER CELL (CD4) - (RCID CLINIC ONLY)
CD4 % Helper T Cell: 43 % (ref 33–65)
CD4 T Cell Abs: 684 /uL (ref 400–1790)

## 2022-09-13 LAB — BASIC METABOLIC PANEL WITH GFR
BUN: 8 mg/dL (ref 7–25)
CO2: 23 mmol/L (ref 20–32)
Calcium: 9.4 mg/dL (ref 8.6–10.2)
Chloride: 105 mmol/L (ref 98–110)
Creat: 0.57 mg/dL (ref 0.50–0.97)
Glucose, Bld: 103 mg/dL — ABNORMAL HIGH (ref 65–99)
Potassium: 4.2 mmol/L (ref 3.5–5.3)
Sodium: 137 mmol/L (ref 135–146)
eGFR: 119 mL/min/{1.73_m2} (ref >=60)

## 2022-09-13 LAB — HCG, SERUM, QUALITATIVE: Preg, Serum: POSITIVE — AB

## 2022-09-13 LAB — HIV-1 RNA QUANT-NO REFLEX-BLD
HIV 1 RNA Quant: NOT DETECTED Copies/mL
HIV-1 RNA Quant, Log: NOT DETECTED Log cps/mL

## 2022-09-16 NOTE — Progress Notes (Addendum)
Patient Name: Candice [name] Date of Birth: 21-Nov-1985 [facility] Initial Prenatal Visit  Candice [name] is a 37 y.o. year old G3P2002 at [redacted]w[redacted]d by LMP who presents for her initial prenatal visit. Pregnancy is not planned but welcomed   She reports nausea without vomiting. She is taking a prenatal vitamin.  She denies pelvic pain or vaginal bleeding.   She presents today with her sister whom she allowed to stay for entire visit. Candice [name] did open up about last encounter in our office where her HIV status was disclosed to [employer] (baby's father). She feels as though her only options presented were for her to tell him or for the doctor to share the news. She reports they (her and Candice [name]) are now working through problems because of this. She was upset at the way things were handled and feels as though her HIPPA rights were violated. She has discussed with others including HIV support group members and CDC members who feel the situation was mishandled. She mentions that she is in the process of filing a complaint regarding the last encounter. She tells me she had plans to disclose this with Candice [name] eventually and that it is unfortunate that he had to find out in that way.   Pregnancy Dating: The patient is dated by LMP.  LMP: 08/09/22 Period is certain:  Yes.  Periods were regular:  Yes.  LMP was a typical period:  Yes.  Using hormonal contraception in 3 months prior to conception: No  Lab Review: Blood type: AB Rh Status: + Antibody screen: collected today HIV: Positive, on anti-retroviral therapy with undetectable levels. Followed by ID RPR: Collected today  Hemoglobin electrophoresis reviewed: No, collected today.  Results of OB urine culture are: collected today Rubella: unknown, collected today  Hep C Ab: unknown, collected today  Varicella status is Immune  PMH: Reviewed and as detailed below: HTN: No  Gestational Hypertension/preeclampsia: No  Type 1 or 2  Diabetes: No  Depression:  Yes, but doing well now. Not on medications. Seizure disorder:  No VTE: No ,  History of STI Yes, trichomonas, HIV  Abnormal Pap smear: No Genital herpes simplex:  Yes   PSH: Gynecologic Surgery:  BTL 08/2011, 07/2016, C/S x2 Surgical history reviewed, notable for: breast reduction   Obstetric History: Obstetric history tab updated and reviewed.  Summary of prior pregnancies:  OB History  Gravida Para Term Preterm AB Living  3 2 2  0 0 2  SAB IAB Ectopic Multiple Live Births  0 0 0 0 2    # Outcome Date GA Lbr Len/2nd Weight Sex Delivery Anes PTL Lv  3 Current           2 Term 09/07/11 [redacted]w[redacted]d  6 lb 14.4 oz (3.13 kg) M CS-Vac EPI  LIV     Birth Comments: C/S for failure to descend Mom with h/o THC use until >[redacted] weeks pregnant, continued tobacco use.     Name: Candice [name]     Apgar1: 8  Apgar5: 9  1 Term 07/25/07 [redacted]w[redacted]d  7 lb 8 oz (3.402 kg) F CS-LTranv EPI N LIV   Past Surgical History:  Procedure Laterality Date   BREAST SURGERY  2019   reduction   CESAREAN SECTION     CESAREAN SECTION  09/07/2011   Procedure: CESAREAN SECTION;  Surgeon: Candice Arms, MD;  Location: [facility];  Service: Gynecology;  Laterality: N/A;  Repeat cesarean section with delivery of baby boy at 63. Apgars  9/9.  Bilateral tubal ligation.   IUD REMOVAL  2005   in cervix   TUBAL LIGATION     tubal reanastomosis  08/13/2016   Cesarean delivery: Yes x2 Gestational Diabetes:  No Hypertension in pregnancy: No History of preterm birth: No History of LGA/SGA infant:  No History of shoulder dystocia: No Indications for referral were reviewed, and the patient has no obstetric indications for referral to High Risk OB Clinic at this time.   Social History: Fathers name: Candice [name] Tobacco use: Yes. Has cut back. Smoking 2 cigarettes a day (previously smoking 5 cig).  Alcohol use:  No Other substance use:  No  Current Medications:  Cabenuva   Valtrex as  needed Prenatal vitamins  Reviewed and appropriate in pregnancy.   Genetic and Infection Screen: Flow Sheet Updated Yes  Prenatal Exam: Gen: Well nourished, well developed.  No distress.  Vitals noted. Pleasant.  HEENT: Normocephalic, atraumatic.  Neck supple.  Good dentition. Posterior tongue with white discoloration, does not scrape off CV: RRR no murmur, gallops or rubs Lungs: CTA B.  Normal respiratory effort without wheezes or rales. Abd: soft, NTND. +BS.  Uterus not appreciated above pelvis. GU: Normal external female genitalia without lesions.  Nl vaginal, well rugated without lesions. No vaginal discharge.  Bimanual exam: No adnexal mass or TTP. No CMT.  Uterus size small. Thick clear discharge appreciated from cervical os Ext: No clubbing, cyanosis Psych: Normal grooming and dress.  Not depressed or anxious appearing.  Normal thought content and process without flight of ideas or looseness of associations  GU exam chaperoned by Candice Bickers, RN  Assessment/Plan:  Candice [name] is a 37 y.o. G3P2002 at approximately [redacted]w[redacted]d by LMP who presents to initiate prenatal care. She is doing well.  Current pregnancy issues include nausea and some intermittent suprapubic cramping at work without vaginal bleeding.  Routine prenatal care: Dating is reliable, however, a dating ultrasound has been ordered due to suprapubic cramping. Dating tab updated. Pre-pregnancy weight updated. Expected weight gain this pregnancy is 11-20 pounds  Prenatal labs ordered as not previously collected  Indications for referral to HROB were reviewed and the patient does meet criteria for referral.  Medication list reviewed and updated.  Recommended patient see a dentist for regular care.  Bleeding and pain precautions reviewed. Importance of prenatal vitamins reviewed.  Genetic screening offered. Patient interested in cell-free DNA, too early to perform now.  The patient has the following indications for  aspirinto begin 81 mg at 12-16 weeks: One high risk condition: no single high risk condition  MORE than one moderate risk condition: obesity, Age 15 or older , identifies as Tree surgeon , and more than 10 years since last pregnancy  Aspirin was  recommended today (advised not to start until 12 weeks) based upon above risk factors (one high risk condition or more than one moderate risk factor)  The patient will be age 28 or over at time of delivery. Referral to genetic counseling was offered today.  The patient has the following risk factors for preexisting diabetes: BMI > 25 and high risk ethnicity (Latino, [place] American, Native American, Mal[place] Islander, Asian Naval architect) . An early 1 hour glucose tolerance test was not ordered today but recommended. Patient would like to defer for now.  Pregnancy Medical Home and PHQ-9 forms completed, problems noted: Yes  2. Pregnancy issues include the following which were addressed today:  I expressed sorrow regarding her feelings from the last visit.  Referral to  high-risk OB for pregnancy. Discussed she is welcome to continue to follow with [place] until she establishes with their clinic She opted to defer early 1-hr glucola today as she was on her work break  Suprapubic cramping while at work earlier this week while doing labor intensive work. No vaginal bleeding. Will obtain U/S to confirm, dating, IUP and reassurance. Discussed MAU precautions. Nausea: Vitamin B6 prescribed. Discussed other ways to help with nausea. Can switch to diclegis is this is ineffective Thrush: rx nystatin swish QID. Avoiding fluconazole in pregnancy.  Recommend that she discuss with ID doctors if no improvement. HIV+: viral loads undetectable. Adherent with medications, follows with ID AMA: offered genetic evaluation and patient amenable. Referral placed  Hx C/S x2, desires VBAC if possible. Recommended discussion with OBGYN providers Hx HSV on Valtrex, takes as needed.  Discussed need for suppression at 36 weeks  Hx BTL in 08/2011 with tubal re-anastamosis in 07/2016 Tobacco use disorder: discussed no amount is safe in pregnancy. She seems motivated to continue to cut back. Congratulated on her efforts thus far. Hx of depression. Reports mood is stable at this time. She is not on any medications for this.  Initial OB labs today    Follow up with OBGYN or can f/u in our office in 4 weeks for next prenatal visit.

## 2022-09-17 ENCOUNTER — Ambulatory Visit (INDEPENDENT_AMBULATORY_CARE_PROVIDER_SITE_OTHER): Payer: BC Managed Care – PPO | Admitting: Family Medicine

## 2022-09-17 ENCOUNTER — Encounter: Payer: Self-pay | Admitting: Family Medicine

## 2022-09-17 ENCOUNTER — Encounter: Payer: Self-pay | Admitting: Neurology

## 2022-09-17 ENCOUNTER — Other Ambulatory Visit (HOSPITAL_COMMUNITY)
Admission: RE | Admit: 2022-09-17 | Discharge: 2022-09-17 | Disposition: A | Discharge status: Home / Self Care | Payer: BC Managed Care – PPO | Source: Ambulatory Visit | Attending: Family Medicine | Admitting: Family Medicine

## 2022-09-17 VITALS — BP 112/76 | HR 94 | Wt 190.2 lb

## 2022-09-17 DIAGNOSIS — O09521 Supervision of elderly multigravida, first trimester: Secondary | ICD-10-CM

## 2022-09-17 DIAGNOSIS — Z3491 Encounter for supervision of normal pregnancy, unspecified, first trimester: Secondary | ICD-10-CM

## 2022-09-17 DIAGNOSIS — O98711 Human immunodeficiency virus [HIV] disease complicating pregnancy, first trimester: Secondary | ICD-10-CM

## 2022-09-17 DIAGNOSIS — Z3A01 Less than 8 weeks gestation of pregnancy: Secondary | ICD-10-CM

## 2022-09-17 DIAGNOSIS — R110 Nausea: Secondary | ICD-10-CM

## 2022-09-17 DIAGNOSIS — O26891 Other specified pregnancy related conditions, first trimester: Secondary | ICD-10-CM | POA: Diagnosis not present

## 2022-09-17 DIAGNOSIS — B20 Human immunodeficiency virus [HIV] disease: Secondary | ICD-10-CM

## 2022-09-17 DIAGNOSIS — Z6832 Body mass index (BMI) 32.0-32.9, adult: Secondary | ICD-10-CM

## 2022-09-17 DIAGNOSIS — O09529 Supervision of elderly multigravida, unspecified trimester: Secondary | ICD-10-CM | POA: Insufficient documentation

## 2022-09-17 DIAGNOSIS — Z21 Asymptomatic human immunodeficiency virus [HIV] infection status: Secondary | ICD-10-CM | POA: Diagnosis not present

## 2022-09-17 DIAGNOSIS — R109 Unspecified abdominal pain: Secondary | ICD-10-CM

## 2022-09-17 DIAGNOSIS — O26899 Other specified pregnancy related conditions, unspecified trimester: Secondary | ICD-10-CM

## 2022-09-17 DIAGNOSIS — Z98891 History of uterine scar from previous surgery: Secondary | ICD-10-CM

## 2022-09-17 DIAGNOSIS — R1030 Lower abdominal pain, unspecified: Secondary | ICD-10-CM

## 2022-09-17 DIAGNOSIS — Z3201 Encounter for pregnancy test, result positive: Secondary | ICD-10-CM

## 2022-09-17 DIAGNOSIS — O99891 Other specified diseases and conditions complicating pregnancy: Secondary | ICD-10-CM

## 2022-09-17 LAB — POCT WET PREP (WET MOUNT)
Clue Cells Wet Prep Whiff POC: NEGATIVE
Trichomonas Wet Prep HPF POC: ABSENT

## 2022-09-17 MED ORDER — VITAMIN B-6 25 MG PO TABS: 25.0000 mg | ORAL_TABLET | Freq: Every day | ORAL | 0 refills | Status: DC

## 2022-09-17 MED ORDER — NYSTATIN 100000 UNIT/ML MT SUSP: 200000.0000 [IU] | Freq: Four times a day (QID) | OROMUCOSAL | 1 refills | Status: DC

## 2022-09-17 NOTE — Patient Instructions (Addendum)
It was wonderful to see you today.  Please bring ALL of your medications with you to every visit.   Today we talked about:  We are obtaining routine blood work today.  We are ordering a dating ultrasound. I have sent a referral for genetic counseling.  Continue your prenatal vitamins daily.   I sent in a medication called vitamin B6 to help with your nausea.  If this does not help, please let us know we can switch to a different medication. Other things that help pregnancy related nausea.  Eating frequent snacks, eating crackers in the morning before getting up. Remember to stay hydrated.  If at any point you are unable to keep any food or drinks down, we would be concerned for dehydration, and I would recommend that you go to the maternal assessment unit for evaluation.  We did swabs today to check for sexually transmitted infections as well as yeast and bacterial vaginosis.  I will let you know if these return abnormal.  I have sent in nystatin for your thrush.  Use this 4 times daily.  If no improvement, I would recommend that you talk with your infectious disease providers to see what other options are available and safe in pregnancy.  If you experience any vaginal bleeding please go directly to the Maternal Assessment Unit at [facility] for evaluation.  Maternity and women's care services located on the [place] side of [facility] [place] (Entrance C off [address]  [address] [place],  [place]  [id number]     Thank you for coming to your visit as scheduled. We have had a large "no-show" problem lately, and this significantly limits our ability to see and care for patients. As a friendly reminder- if you cannot make your appointment please call to cancel. We do have a no show policy for those who do not cancel within 24 hours. Our policy is that if you miss or fail to cancel an appointment within 24 hours, 3 times in a 77-month period,  you may be dismissed from our clinic.   Thank you for choosing [facility].   Please call [phone] with any questions about today's appointment.  Please be sure to schedule follow up at the front  desk before you leave today.   Sharion Settler, DO PGY-3 Family Medicine

## 2022-09-18 LAB — CERVICOVAGINAL ANCILLARY ONLY
Chlamydia: NEGATIVE
Comment: NEGATIVE
Comment: NORMAL
Neisseria Gonorrhea: NEGATIVE

## 2022-09-20 LAB — CBC/D/PLT+RPR+RH+ABO+RUBIGG...

## 2022-09-25 LAB — CBC/D/PLT+RPR+RH+ABO+RUBIGG...
Antibody Screen: NEGATIVE
Basophils Absolute: 0.1 10*3/uL (ref 0.0–0.2)
Basos: 1 %
Bilirubin, UA: NEGATIVE
EOS (ABSOLUTE): 0.1 10*3/uL (ref 0.0–0.4)
Eos: 1 %
Glucose, UA: NEGATIVE
HCV Ab: NONREACTIVE
HIV Screen 4th Generation wRfx: REACTIVE
Hematocrit: 38.2 % (ref 34.0–46.6)
Hemoglobin: 12.3 g/dL (ref 11.1–15.9)
Hepatitis B Surface Ag: NEGATIVE
Immature Grans (Abs): 0 10*3/uL (ref 0.0–0.1)
Immature Granulocytes: 0 %
Leukocytes,UA: NEGATIVE
Lymphocytes Absolute: 2 10*3/uL (ref 0.7–3.1)
Lymphs: 18 %
MCH: 27.4 pg (ref 26.6–33.0)
MCHC: 32.2 g/dL (ref 31.5–35.7)
MCV: 85 fL (ref 79–97)
Monocytes Absolute: 0.8 10*3/uL (ref 0.1–0.9)
Monocytes: 7 %
Neutrophils Absolute: 8.6 10*3/uL — ABNORMAL HIGH (ref 1.4–7.0)
Neutrophils: 73 %
Nitrite, UA: NEGATIVE
Platelets: 337 10*3/uL (ref 150–450)
RBC, UA: NEGATIVE
RBC: 4.49 x10E6/uL (ref 3.77–5.28)
RDW: 15 % (ref 11.7–15.4)
RPR Ser Ql: NONREACTIVE
Rh Factor: POSITIVE
Rubella Antibodies, IGG: 8.07 index (ref >0.99)
Specific Gravity, UA: 1.019 (ref 1.005–1.030)
Urobilinogen, Ur: 0.2 mg/dL (ref 0.2–1.0)
WBC: 11.5 10*3/uL — ABNORMAL HIGH (ref 3.4–10.8)
pH, UA: 7.5 (ref 5.0–7.5)

## 2022-09-25 LAB — MICROSCOPIC EXAMINATION
Bacteria, UA: NONE SEEN
Casts: NONE SEEN /lpf
WBC, UA: NONE SEEN /hpf (ref 0–5)

## 2022-09-25 LAB — HIV-1/HIV-2 QUALITATIVE RNA
Final Interpretation: NEGATIVE
HIV-1 RNA, Qualitative: NONREACTIVE
HIV-2 RNA, Qualitative: NONREACTIVE

## 2022-09-25 LAB — HGB FRACTIONATION CASCADE
Hgb A2: 2.5 % (ref 1.8–3.2)
Hgb A: 97.5 % (ref 96.4–98.8)
Hgb F: 0 % (ref 0.0–2.0)
Hgb S: 0 %

## 2022-09-25 LAB — HIV 1/2 AB DIFFERENTIATION
HIV 1 Ab: NONREACTIVE
HIV 2 Ab: NONREACTIVE
NOTE (HIV CONF MULTIP: NEGATIVE

## 2022-09-25 LAB — URINE CULTURE, OB REFLEX

## 2022-09-25 LAB — HCV INTERPRETATION

## 2022-10-02 ENCOUNTER — Other Ambulatory Visit: Payer: BC Managed Care – PPO

## 2022-10-02 ENCOUNTER — Other Ambulatory Visit: Payer: Self-pay | Admitting: Family Medicine

## 2022-10-02 ENCOUNTER — Ambulatory Visit (HOSPITAL_COMMUNITY)
Admission: RE | Admit: 2022-10-02 | Discharge: 2022-10-02 | Disposition: A | Discharge status: Home / Self Care | Payer: BC Managed Care – PPO | Source: Ambulatory Visit | Attending: Family Medicine | Admitting: Family Medicine

## 2022-10-02 DIAGNOSIS — R1030 Lower abdominal pain, unspecified: Secondary | ICD-10-CM | POA: Insufficient documentation

## 2022-10-02 DIAGNOSIS — O99891 Other specified diseases and conditions complicating pregnancy: Secondary | ICD-10-CM

## 2022-10-02 DIAGNOSIS — Z3A01 Less than 8 weeks gestation of pregnancy: Secondary | ICD-10-CM | POA: Diagnosis not present

## 2022-10-02 DIAGNOSIS — R102 Pelvic and perineal pain: Secondary | ICD-10-CM | POA: Diagnosis not present

## 2022-10-02 DIAGNOSIS — O26899 Other specified pregnancy related conditions, unspecified trimester: Secondary | ICD-10-CM

## 2022-10-02 DIAGNOSIS — O09521 Supervision of elderly multigravida, first trimester: Secondary | ICD-10-CM | POA: Insufficient documentation

## 2022-10-02 DIAGNOSIS — Z6832 Body mass index (BMI) 32.0-32.9, adult: Secondary | ICD-10-CM

## 2022-10-02 DIAGNOSIS — Z98891 History of uterine scar from previous surgery: Secondary | ICD-10-CM

## 2022-10-02 DIAGNOSIS — O26891 Other specified pregnancy related conditions, first trimester: Secondary | ICD-10-CM | POA: Diagnosis not present

## 2022-10-02 DIAGNOSIS — Z21 Asymptomatic human immunodeficiency virus [HIV] infection status: Secondary | ICD-10-CM

## 2022-10-04 ENCOUNTER — Encounter (HOSPITAL_COMMUNITY): Payer: Self-pay

## 2022-10-04 ENCOUNTER — Other Ambulatory Visit: Payer: Self-pay

## 2022-10-04 ENCOUNTER — Emergency Department (HOSPITAL_COMMUNITY)
Admission: EM | Admit: 2022-10-04 | Discharge: 2022-10-05 | Disposition: A | Discharge status: Home / Self Care | Payer: BC Managed Care – PPO | Attending: Emergency Medicine | Admitting: Emergency Medicine

## 2022-10-04 DIAGNOSIS — I959 Hypotension, unspecified: Secondary | ICD-10-CM | POA: Diagnosis not present

## 2022-10-04 DIAGNOSIS — R1032 Left lower quadrant pain: Secondary | ICD-10-CM | POA: Insufficient documentation

## 2022-10-04 DIAGNOSIS — O26891 Other specified pregnancy related conditions, first trimester: Secondary | ICD-10-CM

## 2022-10-04 DIAGNOSIS — Z3A01 Less than 8 weeks gestation of pregnancy: Secondary | ICD-10-CM | POA: Diagnosis not present

## 2022-10-04 DIAGNOSIS — R109 Unspecified abdominal pain: Secondary | ICD-10-CM | POA: Diagnosis not present

## 2022-10-04 DIAGNOSIS — Z3A08 8 weeks gestation of pregnancy: Secondary | ICD-10-CM | POA: Diagnosis not present

## 2022-10-04 DIAGNOSIS — R1084 Generalized abdominal pain: Secondary | ICD-10-CM | POA: Diagnosis not present

## 2022-10-04 DIAGNOSIS — Z21 Asymptomatic human immunodeficiency virus [HIV] infection status: Secondary | ICD-10-CM | POA: Insufficient documentation

## 2022-10-04 DIAGNOSIS — R42 Dizziness and giddiness: Secondary | ICD-10-CM | POA: Diagnosis not present

## 2022-10-04 DIAGNOSIS — R55 Syncope and collapse: Secondary | ICD-10-CM

## 2022-10-04 NOTE — ED Provider Triage Note (Signed)
Emergency Medicine Provider Triage Evaluation Note  Candice [name] , a 37 y.o. female  was evaluated in triage.  Pt complains of lower abdominal pain.  G3 P2 proximately [redacted] weeks pregnant.  Has not establish care with OB/GYN however has had ultrasound which showed IUP per patient.  No vaginal bleeding.  Initially had some lightheadedness however denies syncope.  Had nausea with EMS was given subsequent Zofran.  No headache, chest pain, shortness of breath, back pain, changes in stools.  Review of Systems  Positive: Abdominal pain in pregnancy, nausea, vomiting Negative: syncope, headache, vision changes  Physical Exam  BP 116/71   Pulse 83   Temp 98.4 F (36.9 C)   Resp 14   Ht 5\' 3"  (1.6 m)   Wt 88 kg   LMP 08/09/2022   SpO2 100%   BMI 34.37 kg/m  Gen:   Awake, no distress   Resp:  Normal effort  MSK:   Moves extremities without difficulty  Other:    Medical Decision Making  Medically screening exam initiated at 10:32 PM.  Appropriate orders placed.  Ashleymarie L Edsall was informed that the remainder of the evaluation will be completed by another provider, this initial triage assessment does not replace that evaluation, and the importance of remaining in the ED until their evaluation is complete.  Abdominal pain in pregnancy  Discussed with MAU provider Shanda Bumps will transfer for abdominal pain in setting of pregnancy   Orvell Careaga A, PA-C 10/04/22 2232

## 2022-10-05 DIAGNOSIS — R109 Unspecified abdominal pain: Secondary | ICD-10-CM | POA: Diagnosis not present

## 2022-10-05 DIAGNOSIS — Z3A08 8 weeks gestation of pregnancy: Secondary | ICD-10-CM | POA: Diagnosis not present

## 2022-10-05 DIAGNOSIS — O26891 Other specified pregnancy related conditions, first trimester: Secondary | ICD-10-CM | POA: Diagnosis not present

## 2022-10-05 NOTE — MAU Note (Signed)
..  Candice [name] is a 37 y.o. at [redacted]w[redacted]d here in MAU from [facility] reporting: abdominal cramping that began around 7pm when she woke up from a nap. Went to the restroom attempting to have a BM, started feeling hot and asked her son to bring her some water, that is the last thing she remembers before EMTs arrived.  Now continues to have intermitted abdominal cramping. Denies vaginal bleeding. Reports she had N/V but it was relieved by zofran.   Pain score: 6/10 Vitals:   10/04/22 2345 10/05/22 0000  BP: 113/76 121/74  Pulse: 98 94  Resp: (!) 22 19  Temp:    SpO2: 100% 100%

## 2022-10-05 NOTE — ED Provider Notes (Signed)
[facility] Provider Note  CSN: [id number] Arrival date & time: 10/04/22 2220  Chief Complaint(s) Abdominal Pain (Arrived via ems from home after waking from sleep with generalized abd pain [redacted] weeks pregnant 3rd pregnancy 1 bout emesis 18g iv lac  zofran given cbg 96 vs 120/74 76 20 100% ra)  HPI Candice [name] is a 37 y.o. female G3 P2 at approximately 8 weeks with confirmed IUP by OB/GYN ultrasound here for abdominal pain described as cramping sensation.  Patient also had nausea and nonbloody nonbilious emesis with lightheadedness and near syncope.  This was in the setting of trying to have a bowel movement.  No vaginal bleeding or discharge.  The history is provided by the patient.  Abdominal Pain   Past Medical History Past Medical History:  Diagnosis Date   Complication of anesthesia    epidural with last pregnancy made entire left side numb, had to d/c   Depression    Headache    HSV 06/28/2007   Hx of migraines    Patient Active Problem List   Diagnosis Date Noted   Supervision of multigravida of advanced maternal age 55/27/2024   High-risk pregnancy in first trimester 09/11/2022   Positive pregnancy test 09/10/2022   Pelvic pain 08/29/2022   Vitamin D deficiency 06/20/2022   Constipation 02/18/2022   Arthralgia 02/18/2022   Depression, major, single episode, mild 01/13/2022   Cyst of vulva 09/19/2021   Frequent urination 09/11/2021   UTI symptoms 07/30/2021   Medication monitoring encounter 01/11/2021   MS (multiple sclerosis) 05/21/2020   GERD (gastroesophageal reflux disease) 05/21/2020   Right-sided abdominal pain of unknown etiology 04/06/2020   Abnormal MRI, spine 04/06/2020   Metatarsalgia of right foot 04/06/2020   Healthcare maintenance 02/10/2020   Right leg numbness 02/07/2020   HIV (human immunodeficiency virus infection) 09/07/2019   Hemorrhagic cyst of left ovary 10/14/2016   Abdominal pain 04/16/2016   MRSA colonization  09/25/2014   Major depressive disorder, recurrent episode, moderate 06/08/2013   Genital herpes 02/16/2013   Back pain 10/13/2011   Vaginal discharge 10/10/2010   Cervical cancer screening 10/10/2010   OBESITY, NOS 08/20/2006   Home Medication(s) Prior to Admission medications   Medication Sig Start Date End Date Taking? Authorizing Provider  cabotegravir & rilpivirine ER (CABENUVA) 600 & 900 MG/3ML injection Inject 1 kit into the muscle every 30 (thirty) days. 12/12/20  Yes Kuppelweiser, Cassie L, RPH-CPP  Prenatal Vit-Fe Fumarate-FA (PRENATAL MULTIVITAMIN) TABS tablet Take 1 tablet by mouth daily at 12 noon.   Yes [provider]  pyridOXINE (VITAMIN B6) 25 MG tablet Take 1 tablet (25 mg total) by mouth daily. Patient not taking: Reported on 10/04/2022 09/17/22   Sabino Dick, DO  Allergies Contrast media [iodinated contrast media]  Review of Systems Review of Systems  Gastrointestinal:  Positive for abdominal pain.   As noted in HPI  Physical Exam Vital Signs  I have reviewed the triage vital signs BP 121/66 (BP Location: Right Arm)   Pulse (!) 101   Temp 98.3 F (36.8 C) (Oral)   Resp 17   Ht 5\' 3"  (1.6 m)   Wt 88.8 kg   LMP 08/09/2022   SpO2 100%   BMI 34.67 kg/m   Physical Exam Vitals reviewed.  Constitutional:      General: She is not in acute distress.    Appearance: She is well-developed. She is not diaphoretic.  HENT:     Head: Normocephalic and atraumatic.     Right Ear: External ear normal.     Left Ear: External ear normal.     Nose: Nose normal.  Eyes:     General: No scleral icterus.    Conjunctiva/sclera: Conjunctivae normal.  Neck:     Trachea: Phonation normal.  Cardiovascular:     Rate and Rhythm: Normal rate and regular rhythm.  Pulmonary:     Effort: Pulmonary effort is normal. No respiratory  distress.     Breath sounds: No stridor.  Abdominal:     General: There is no distension.     Tenderness: There is abdominal tenderness in the left lower quadrant. There is no guarding or rebound.  Musculoskeletal:        General: Normal range of motion.     Cervical back: Normal range of motion.  Neurological:     Mental Status: She is alert and oriented to person, place, and time.  Psychiatric:        Behavior: Behavior normal.     ED Results and Treatments Labs (all labs ordered are listed, but only abnormal results are displayed) Labs Reviewed - No data to display                                                                                                                       EKG  EKG Interpretation  Date/Time:  Saturday October 04 2022 22:25:46 EDT Ventricular Rate:  86 PR Interval:  164 QRS Duration: 84 QT Interval:  356 QTC Calculation: 426 R Axis:   63 Text Interpretation: Sinus rhythm Confirmed by Drema Pry [id number]) on 10/05/2022 12:31:43 AM       Radiology No results found.  Medications Ordered in ED Medications - No data to display  Procedures Procedures  (including critical care time)  Medical Decision Making / ED Course  Click here for ABCD2, HEART and other calculators  Medical Decision Making Amount and/or Complexity of Data Reviewed ECG/medicine tests: ordered and independent interpretation performed. Decision-making details documented in ED Course.  Risk Decision regarding hospitalization.  Abdominal pain in the setting of pregnancy.  Mild discomfort to palpation in left lower quadrant.  Patient admits he screened and sent to MAU to rule out pregnancy related process.       Final Clinical Impression(s) / ED Diagnoses Final diagnoses:  Abdominal pain in pregnancy, first trimester  [redacted] weeks gestation of  pregnancy  Vasovagal near-syncope           This chart was dictated using voice recognition software.  Despite best efforts to proofread,  errors can occur which can change the documentation meaning.    Nira Conn, MD 10/05/22 [phone]

## 2022-10-05 NOTE — ED Notes (Signed)
Attempted to call report to MAU

## 2022-10-05 NOTE — Discharge Instructions (Signed)
Safe Medications in Pregnancy   Acne: Benzoyl Peroxide Salicylic Acid  Backache/Headache: Tylenol: 2 regular strength every 4 hours OR              2 Extra strength every 6 hours  Colds/Coughs/Allergies: Benadryl (alcohol free) 25 mg every 6 hours as needed Breath right strips Claritin Cepacol throat lozenges Chloraseptic throat spray Cold-Eeze- up to three times per day Cough drops, alcohol free Flonase (by prescription only) Guaifenesin Mucinex Robitussin DM (plain only, alcohol free) Saline nasal spray/drops Sudafed (pseudoephedrine) & Actifed ** use only after [redacted] weeks gestation and if you do not have high blood pressure Tylenol Vicks Vaporub Zinc lozenges Zyrtec   Constipation: Colace Ducolax suppositories Fleet enema Glycerin suppositories Metamucil Milk of magnesia Miralax Senokot Smooth move tea  Diarrhea: Kaopectate Imodium A-D  *NO pepto Bismol  Hemorrhoids: Anusol Anusol HC Preparation H Tucks  Indigestion: Tums Maalox Mylanta Zantac  Pepcid  Insomnia: Benadryl (alcohol free) 25mg every 6 hours as needed Tylenol PM Unisom, no Gelcaps  Leg Cramps: Tums MagGel  Nausea/Vomiting:  Bonine Dramamine Emetrol Ginger extract Sea bands Meclizine  Nausea medication to take during pregnancy:  Unisom (doxylamine succinate 25 mg tablets) Take one tablet daily at bedtime. If symptoms are not adequately controlled, the dose can be increased to a maximum recommended dose of two tablets daily (1/2 tablet in the morning, 1/2 tablet mid-afternoon and one at bedtime). Vitamin B6 100mg tablets. Take one tablet twice a day (up to 200 mg per day).  Skin Rashes: Aveeno products Benadryl cream or 25mg every 6 hours as needed Calamine Lotion 1% cortisone cream  Yeast infection: Gyne-lotrimin 7 Monistat 7  Gum/tooth pain: Anbesol  **If taking multiple medications, please check labels to avoid duplicating the same active ingredients **take  medication as directed on the label ** Do not exceed 4000 mg of tylenol in 24 hours **Do not take medications that contain aspirin or ibuprofen      [facility] Providers          [facility]  [address], [place], [place] [address]  [phone]  [facility]  [address], [place], [place] [address]  [phone]  [facility]  [address], [place], [place] [address]  [phone]  [facility] [address], [place], [place] [address] [phone]  [facility] [place]  [address], [place], [place] [address]  [phone]  [facility]  [address]First floor), [place], [place] [address]  [phone]  [facility]  [address], [place], [place] [address]  [phone]  [facility]  [address], [place], [place] [address]  [phone]  [facility]  [address], [place], [place] [address]  [phone]  [facility]  [address], [place], [place] [address]  [phone]  [facility]  [address], [place], [place] [address]  [phone]  [facility]  [address], [place], [place] [address]  [phone]  [facility]   [address], [place], [place] [address]  [phone]  [facility]  [address], [place], [place] [address]   [phone]  [facility] [address], , [place] [address] [phone]  [facility]  [address], [place], [place] [address]  [phone]         

## 2022-10-05 NOTE — MAU Provider Note (Signed)
History     CSN: [id number]  Arrival date and time: 10/04/22 2220   Event Date/Time   First Provider Initiated Contact with Patient 10/05/22 0116      Chief Complaint  Patient presents with   Abdominal Pain    Arrived via ems from home after waking from sleep with generalized abd pain [redacted] weeks pregnant 3rd pregnancy 1 bout emesis 18g iv lac  zofran given cbg 96 vs 120/74 76 20 100% ra   Candice [name] is a 37 y.o. G3P2002 at [redacted]w[redacted]d who has established prenatal care with [facility].  She presents today for abdominal pain. She reports that the pain is located across her abdomen and "feels real tight."  She states it is intermittent in nature and initially thought it was because she had to have bowel movement.  She states she attempted to go to the restroom, but started to feel hot and sweaty.  She states she told her son to bring her some water and she doesn't remember what happened after that, but her son called EMS. She states she did not lose complete consciousness as she remembers her daughter also coming in the bathroom and touching her face.  She states currently the abdominal pain is a 4-5/10.    Of note, Cadee reports she had an [place], yesterday, that showed IUP with HR.    OB History     Gravida  3   Para  2   Term  2   Preterm  0   AB  0   Living  2      SAB  0   IAB  0   Ectopic  0   Multiple  0   Live Births  2           Past Medical History:  Diagnosis Date   Complication of anesthesia    epidural with last pregnancy made entire left side numb, had to d/c   Depression    Headache    HSV 06/28/2007   Hx of migraines     Past Surgical History:  Procedure Laterality Date   BREAST SURGERY  2019   reduction   CESAREAN SECTION     CESAREAN SECTION  09/07/2011   Procedure: CESAREAN SECTION;  Surgeon: Lazaro Arms, MD;  Location: [facility];  Service: Gynecology;  Laterality: N/A;  Repeat cesarean section with delivery of baby boy at 9. Apgars  9/9.  Bilateral tubal ligation.   IUD REMOVAL  2005   in cervix   TUBAL LIGATION     tubal reanastomosis  08/13/2016    Family History  Problem Relation Age of Onset   Alcohol abuse Mother    Drug abuse Mother    Depression Mother    Alcohol abuse Father    Drug abuse Father    Multiple sclerosis Sister    Mental illness Sister        bipolar ptsd   Schizophrenia Sister    Diabetes Maternal Grandmother    Hypertension Maternal Grandmother    Cancer Maternal Grandmother        ovarian, breast and lung   Depression Maternal Grandmother     Social History   Tobacco Use   Smoking status: Every Day    Packs/day: .25    Types: Cigarettes    Passive exposure: Current   Smokeless tobacco: Never   Tobacco comments:    Trying to quit due to pregnancy   Vaping Use   Vaping Use:  Never used  Substance Use Topics   Alcohol use: Not Currently    Comment: occasional   Drug use: Yes    Frequency: 7.0 times per week    Types: Marijuana    Comment: trying to quit due to pregnancy    Allergies:  Allergies  Allergen Reactions   Contrast Media [Iodinated Contrast Media] Nausea And Vomiting    Medications Prior to Admission  Medication Sig Dispense Refill Last Dose   cabotegravir & rilpivirine ER (CABENUVA) 600 & 900 MG/3ML injection Inject 1 kit into the muscle every 30 (thirty) days. 6 mL 1    Prenatal Vit-Fe Fumarate-FA (PRENATAL MULTIVITAMIN) TABS tablet Take 1 tablet by mouth daily at 12 noon.   10/03/2022   nystatin (MYCOSTATIN) 100000 UNIT/ML suspension Take 2 mLs (200,000 Units total) by mouth 4 (four) times daily. Apply 45mL to each cheek (Patient not taking: Reported on 10/04/2022) 60 mL 1 Not Taking   pyridOXINE (VITAMIN B6) 25 MG tablet Take 1 tablet (25 mg total) by mouth daily. (Patient not taking: Reported on 10/04/2022) 60 tablet 0 Not Taking   valACYclovir (VALTREX) 1000 MG tablet TAKE 1 TABLET BY MOUTH TWICE A DAY (Patient not taking: Reported on 10/04/2022) 10 tablet  2 Not Taking    Review of Systems  Gastrointestinal:  Positive for abdominal pain and constipation (BM this morning, no difficulties with passing). Negative for diarrhea, nausea and vomiting.  Genitourinary:  Negative for difficulty urinating, dysuria, vaginal bleeding and vaginal discharge.  Neurological:  Positive for headaches (Currently, radiates to neck and back. Describes as "tight." Rates 6/10.). Negative for light-headedness.   Physical Exam   Blood pressure 121/66, pulse (!) 101, temperature 98.3 F (36.8 C), temperature source Oral, resp. rate 17, height 5\' 3"  (1.6 m), weight 88.8 kg, last menstrual period 08/09/2022, SpO2 100 %.  Physical Exam Vitals reviewed.  Constitutional:      Appearance: She is well-developed.  HENT:     Head: Normocephalic and atraumatic.  Eyes:     Conjunctiva/sclera: Conjunctivae normal.  Cardiovascular:     Rate and Rhythm: Normal rate.  Pulmonary:     Effort: Pulmonary effort is normal.  Abdominal:     Palpations: Abdomen is soft.     Tenderness: There is abdominal tenderness in the periumbilical area. There is no guarding.  Musculoskeletal:        General: Normal range of motion.     Cervical back: Normal range of motion.  Skin:    General: Skin is warm and dry.  Neurological:     Mental Status: She is alert and oriented to person, place, and time.  Psychiatric:        Mood and Affect: Mood normal.        Behavior: Behavior normal.    MAU Course  Procedures No results found for this or any previous visit (from the past 24 hour(s)).  Patient informed that the ultrasound is considered a limited OB ultrasound and is not intended to be a complete ultrasound exam.  Patient also informed that the ultrasound is not being completed with the intent of assessing for fetal or placental anomalies or any pelvic abnormalities.  Explained that the purpose of today's ultrasound is to assess for  viability.  Patient acknowledges the purpose of the  exam and the limitations of the study.  SIUP with HR of 167  MDM BSUS Assessment and Plan  37 year old, G3P2002  SIUP at 8.1 weeks Abdominal Pain Headache Vasovagal Episode with  Near Syncope  -Reviewed POC with patient. -Exam performed.  -Patient offered and declines pain medication.  -Discussed common discomforts/symptoms during pregnancy including vasovagal incidents. -Discussed proper hydration and nutritional intake with emphasis on protein. -Further discussed management of constipation with high fiber and OTC stool softener if necessary. Given information in AVS. -BSUS performed as above. -Encouraged to call primary office or return to MAU if symptoms worsen or with the onset of new symptoms. -Discharged to home in stable condition.   Cherre Robins 10/05/2022, 1:16 AM

## 2022-10-06 ENCOUNTER — Other Ambulatory Visit (HOSPITAL_COMMUNITY): Payer: Self-pay

## 2022-10-06 NOTE — Progress Notes (Unsigned)
    Aleen Sells D.[name] [address] [place] [address] Phone: [phone]   Assessment and Plan:     There are no diagnoses linked to this encounter.  ***   Pertinent previous records reviewed include ***   Follow Up: ***     Subjective:   I, Candice [name], am serving as a Neurosurgeon for Doctor Richardean Sale   Chief Complaint: thoracic back pain    HPI:    02/17/2022 Patient is a 37 year old female complaining of thoracic back pain. Patient states  Beginning around 2018, she began having intermittent episodes of numbness, weakness and dizziness.  In May or June of that year, she began having a recurrence of symptoms, including right flank pain and numbness radiating down her right leg with some associated right leg weakness.  She felt off balance.  She saw neurology at that time.  MRI of thoracic spine revealed subtle enhancing hyperintense focus at the T8-T9 region.would like to discuss OMT    03/10/2022 Patient states that she is alright, left side back is still tight and shoulders    04/07/2022 Patient states that she is okay left side is still crazy even with PT   05/05/2022 Patient states she is good needs a tune up   06/02/2022 Patient states that she is pretty good    06/30/2022 Patient states left side is a problem , PT told her to re check rotator cuff   07/22/2022 Patient states she is pretty good, CSI was a life saver    09/09/2022 Patient states that she has been having intermittent flares   10/06/2022 Patient states    Relevant Historical Information: Multiple sclerosis Additional pertinent review of systems negative.   Current Outpatient Medications:    cabotegravir & rilpivirine ER (CABENUVA) 600 & 900 MG/3ML injection, Inject 1 kit into the muscle every 30 (thirty) days., Disp: 6 mL, Rfl: 1   Prenatal Vit-Fe Fumarate-FA (PRENATAL MULTIVITAMIN) TABS tablet, Take 1 tablet by mouth daily at 12 noon., Disp:  , Rfl:    pyridOXINE (VITAMIN B6) 25 MG tablet, Take 1 tablet (25 mg total) by mouth daily. (Patient not taking: Reported on 10/04/2022), Disp: 60 tablet, Rfl: 0   Objective:     There were no vitals filed for this visit.    There is no height or weight on file to calculate BMI.    Physical Exam:    ***   Electronically signed by:  Aleen Sells D.[name] 7:39 AM 10/06/22

## 2022-10-07 ENCOUNTER — Ambulatory Visit (INDEPENDENT_AMBULATORY_CARE_PROVIDER_SITE_OTHER): Payer: BC Managed Care – PPO | Admitting: Sports Medicine

## 2022-10-07 VITALS — BP 122/82 | HR 96 | Ht 63.0 in | Wt 195.0 lb

## 2022-10-07 DIAGNOSIS — M9901 Segmental and somatic dysfunction of cervical region: Secondary | ICD-10-CM | POA: Diagnosis not present

## 2022-10-07 DIAGNOSIS — G8929 Other chronic pain: Secondary | ICD-10-CM

## 2022-10-07 DIAGNOSIS — M9902 Segmental and somatic dysfunction of thoracic region: Secondary | ICD-10-CM | POA: Diagnosis not present

## 2022-10-07 DIAGNOSIS — M546 Pain in thoracic spine: Secondary | ICD-10-CM | POA: Diagnosis not present

## 2022-10-07 DIAGNOSIS — Z3A08 8 weeks gestation of pregnancy: Secondary | ICD-10-CM

## 2022-10-07 DIAGNOSIS — M542 Cervicalgia: Secondary | ICD-10-CM | POA: Diagnosis not present

## 2022-10-07 DIAGNOSIS — M9908 Segmental and somatic dysfunction of rib cage: Secondary | ICD-10-CM | POA: Diagnosis not present

## 2022-10-07 NOTE — Patient Instructions (Signed)
Good to see you   

## 2022-10-14 ENCOUNTER — Ambulatory Visit: Payer: BC Managed Care – PPO | Admitting: Family Medicine

## 2022-10-14 VITALS — BP 123/80 | HR 90 | Wt 196.2 lb

## 2022-10-14 DIAGNOSIS — O09521 Supervision of elderly multigravida, first trimester: Secondary | ICD-10-CM

## 2022-10-14 DIAGNOSIS — O98711 Human immunodeficiency virus [HIV] disease complicating pregnancy, first trimester: Secondary | ICD-10-CM | POA: Diagnosis not present

## 2022-10-14 DIAGNOSIS — Z3481 Encounter for supervision of other normal pregnancy, first trimester: Secondary | ICD-10-CM

## 2022-10-14 DIAGNOSIS — B20 Human immunodeficiency virus [HIV] disease: Secondary | ICD-10-CM | POA: Diagnosis not present

## 2022-10-14 DIAGNOSIS — Z3A09 9 weeks gestation of pregnancy: Secondary | ICD-10-CM

## 2022-10-14 NOTE — Patient Instructions (Signed)
It was wonderful to see you today.  Please bring ALL of your medications with you to every visit.   Today we talked about:  Call the office below for an appointment.  [facility] Address: [address] Floor, [place], [place] [address] Phone: [phone]  If you experience any vaginal bleeding, significant pelvic pain, recurrent nausea/vomiting, please go directly to the Maternal Assessment Unit at [facility] for evaluation.  Maternity and women's care services located on the [place] side of [facility]. [facility] (Entrance C off [address]).  [address] [place],  [place]  [address]     Thank you for coming to your visit as scheduled. We have had a large "no-show" problem lately, and this significantly limits our ability to see and care for patients. As a friendly reminder- if you cannot make your appointment please call to cancel. We do have a no show policy for those who do not cancel within 24 hours. Our policy is that if you miss or fail to cancel an appointment within 24 hours, 3 times in a 86-month period, you may be dismissed from our clinic.   Thank you for choosing [facility].   Please call [phone] with any questions about today's appointment.  Please be sure to schedule follow up at the front  desk before you leave today.   Sabino Dick, DO PGY-3 Family Medicine

## 2022-10-14 NOTE — Progress Notes (Signed)
  Patient Name: [name] Date of Birth: [birth year] [facility] Prenatal Visit  Candice [name] is a 37 y.o. G3P2002 at [redacted]w[redacted]d here for routine follow up. She is dated by LMP consistent with early ultrasound at [redacted]w[redacted]d.  She reports no complaints.  She denies vaginal bleeding.  See flow sheet for details.  Vitals:   10/14/22 1510  BP: 123/80  Pulse: 90   A/P: Pregnancy at [redacted]w[redacted]d.  Doing well.    Routine Prenatal Care:  Dating reviewed, dating tab is correct Fetal heart tones not assessed as too early.  Influenza vaccine not administered as not influenza season.  Does not normally get influenza vaccine.  COVID vaccination was discussed and declines.  The patient has the following indication for screening preexisting diabetes: BMI > 25 and high risk ethnicity (Latino, [place] American, Native American, [place] Islander, Asian Naval architect) . Would like to return for this on Friday. Anatomy ultrasound ordered to be scheduled at 18-20 weeks. Patient is interested in genetic screening, will defer to subsequent visit as she is too early at this time. Bleeding and pain precautions reviewed. The patient has the following indications for aspirinto begin 81 mg at 12-16 weeks: One high risk condition: no single high risk condition  MORE than one moderate risk condition: obesity, Age 44 or older , and identifies as African American  Aspirin was  recommended to start at 12 weeks based upon above risk factors (one high risk condition or more than one moderate risk factor)   2. Pregnancy issues include the following and were addressed as appropriate today:  Hx HSV: will need valacyclovir at 36 weeks for prophylaxis HIV: viral load undetectable Advanced maternal age History C-section x 2, desires VBAC if possible. Hx tobacco use disorder: smokes 2 cigarettes a day. Working to try to continue to cut back.  Nausea has resolved, not taking B6 vitamins  Early 1-hr GTT scheduled for  Friday 4/26 Problem list  and pregnancy box updated: Yes.   Follow up 4 weeks. Given information to [facility]. Can continue to be followed here until she is able to be seen by them.

## 2022-10-17 ENCOUNTER — Encounter: Payer: BC Managed Care – PPO | Admitting: Pharmacist

## 2022-10-17 ENCOUNTER — Other Ambulatory Visit (INDEPENDENT_AMBULATORY_CARE_PROVIDER_SITE_OTHER): Payer: BC Managed Care – PPO

## 2022-10-17 DIAGNOSIS — O09521 Supervision of elderly multigravida, first trimester: Secondary | ICD-10-CM

## 2022-10-17 DIAGNOSIS — Z3481 Encounter for supervision of other normal pregnancy, first trimester: Secondary | ICD-10-CM | POA: Diagnosis not present

## 2022-10-17 LAB — POCT 1 HR PRENATAL GLUCOSE: Glucose 1 Hr Prenatal, POC: 109 mg/dL

## 2022-10-21 ENCOUNTER — Other Ambulatory Visit (HOSPITAL_COMMUNITY): Payer: Self-pay

## 2022-10-22 ENCOUNTER — Inpatient Hospital Stay (HOSPITAL_COMMUNITY)
Admission: AD | Admit: 2022-10-22 | Discharge: 2022-10-22 | Disposition: A | Discharge status: Home / Self Care | Payer: BC Managed Care – PPO | Attending: Obstetrics & Gynecology | Admitting: Obstetrics & Gynecology

## 2022-10-22 ENCOUNTER — Encounter (HOSPITAL_COMMUNITY): Payer: Self-pay | Admitting: Obstetrics & Gynecology

## 2022-10-22 DIAGNOSIS — R109 Unspecified abdominal pain: Secondary | ICD-10-CM | POA: Diagnosis not present

## 2022-10-22 DIAGNOSIS — O3411 Maternal care for benign tumor of corpus uteri, first trimester: Secondary | ICD-10-CM | POA: Diagnosis not present

## 2022-10-22 DIAGNOSIS — D72829 Elevated white blood cell count, unspecified: Secondary | ICD-10-CM | POA: Diagnosis not present

## 2022-10-22 DIAGNOSIS — G44209 Tension-type headache, unspecified, not intractable: Secondary | ICD-10-CM | POA: Insufficient documentation

## 2022-10-22 DIAGNOSIS — F17210 Nicotine dependence, cigarettes, uncomplicated: Secondary | ICD-10-CM | POA: Diagnosis not present

## 2022-10-22 DIAGNOSIS — O99111 Other diseases of the blood and blood-forming organs and certain disorders involving the immune mechanism complicating pregnancy, first trimester: Secondary | ICD-10-CM | POA: Diagnosis not present

## 2022-10-22 DIAGNOSIS — O99331 Smoking (tobacco) complicating pregnancy, first trimester: Secondary | ICD-10-CM | POA: Diagnosis not present

## 2022-10-22 DIAGNOSIS — O99351 Diseases of the nervous system complicating pregnancy, first trimester: Secondary | ICD-10-CM | POA: Insufficient documentation

## 2022-10-22 DIAGNOSIS — D259 Leiomyoma of uterus, unspecified: Secondary | ICD-10-CM | POA: Insufficient documentation

## 2022-10-22 DIAGNOSIS — O26891 Other specified pregnancy related conditions, first trimester: Secondary | ICD-10-CM | POA: Insufficient documentation

## 2022-10-22 DIAGNOSIS — R42 Dizziness and giddiness: Secondary | ICD-10-CM | POA: Insufficient documentation

## 2022-10-22 DIAGNOSIS — Z3A10 10 weeks gestation of pregnancy: Secondary | ICD-10-CM | POA: Diagnosis not present

## 2022-10-22 HISTORY — DX: Multiple sclerosis, unspecified: G35.D

## 2022-10-22 HISTORY — DX: Multiple sclerosis: G35

## 2022-10-22 HISTORY — DX: Asymptomatic human immunodeficiency virus (hiv) infection status: Z21

## 2022-10-22 HISTORY — DX: Human immunodeficiency virus (HIV) disease: B20

## 2022-10-22 LAB — URINALYSIS, ROUTINE W REFLEX MICROSCOPIC
Bilirubin Urine: NEGATIVE
Glucose, UA: NEGATIVE mg/dL
Hgb urine dipstick: NEGATIVE
Ketones, ur: NEGATIVE mg/dL
Leukocytes,Ua: NEGATIVE
Nitrite: NEGATIVE
Protein, ur: NEGATIVE mg/dL
Specific Gravity, Urine: 1.016 (ref 1.005–1.030)
pH: 7 (ref 5.0–8.0)

## 2022-10-22 LAB — CBC
HCT: 34.1 % — ABNORMAL LOW (ref 36.0–46.0)
Hemoglobin: 11.7 g/dL — ABNORMAL LOW (ref 12.0–15.0)
MCH: 27.8 pg (ref 26.0–34.0)
MCHC: 34.3 g/dL (ref 30.0–36.0)
MCV: 81 fL (ref 80.0–100.0)
Platelets: 365 10*3/uL (ref 150–400)
RBC: 4.21 MIL/uL (ref 3.87–5.11)
RDW: 16.5 % — ABNORMAL HIGH (ref 11.5–15.5)
WBC: 19.6 10*3/uL — ABNORMAL HIGH (ref 4.0–10.5)
nRBC: 0 % (ref 0.0–0.2)

## 2022-10-22 MED ORDER — ONDANSETRON 4 MG PO TBDP: 4.0000 mg | ORAL_TABLET | Freq: Three times a day (TID) | ORAL | 0 refills | Status: DC | PRN

## 2022-10-22 MED ORDER — ONDANSETRON 4 MG PO TBDP
4.0000 mg | ORAL_TABLET | Freq: Three times a day (TID) | ORAL | Status: DC | PRN
  Administered 2022-10-22: 4 mg via ORAL
  Filled 2022-10-22: qty 1

## 2022-10-22 MED ORDER — ACETAMINOPHEN 500 MG PO TABS
1000.0000 mg | ORAL_TABLET | Freq: Four times a day (QID) | ORAL | Status: DC | PRN
  Administered 2022-10-22: 1000 mg via ORAL
  Filled 2022-10-22: qty 2

## 2022-10-22 NOTE — MAU Note (Addendum)
...  Candice [name] is a 37 y.o. at [redacted]w[redacted]d here in MAU reporting: Dizziness, HA, SOB, left sided chest pain, hot/cold chills, and right lower abdominal cramping. She reports her sx's began yesterday and she was able to go home and "sleep it off." She reports when she woke up this morning she felt the same way so she decided to come be evaluated. Sniffling in triage but reports she has been crying. Also endorses HA's. She reports she feels as if she has had a fever but has not taken her temp. She reports she feels as if she cannot take a deep breath. Denies anyone being sick around her.   Last took 1500 mg of Tylenol around 0600. One 500 mg at noon.  Onset of complaint: Yesterday  Pain score:  7/10 right lower abdomen 4/10 HA - anterior  FHT: unable to doppler FHT - patient unable to tolerate pressure of the doppler Lab orders placed from triage:  UA, Standing ED EKG

## 2022-10-22 NOTE — MAU Provider Note (Signed)
History     CSN: [id number]  Arrival date and time: 10/22/22 1757   Event Date/Time   First Provider Initiated Contact with Patient 10/22/22 1852      Chief Complaint  Patient presents with   Headache   Shortness of Breath   Chest Pain   Dizziness   Abdominal Pain   Chills   37 y.o. G3P2002 @10 .4 wks presenting with dizzy spells and abdominal cramping. Reports sx started about 2 days ago. States she feels dizzy then has SOB and left sided CP after the spell. She was able to sleep it off yesterday but today she feels worse. Denies syncope. Reports lower abdominal cramping that started a few days ago. Pain is intermittent and radiates into thigh. Denies VB, spotting, or discharge. She is eating and drinking but her appetite was poor today. Reports some nausea. Endorses feeling hot and cold. Has not checked temp. Denies respiratory sx. Tearful during first interview, when asked states she just upset because she doesn't feel good. Also states she doesn't feel as pregnant and worried about the pregnancy.    OB History     Gravida  3   Para  2   Term  2   Preterm  0   AB  0   Living  2      SAB  0   IAB  0   Ectopic  0   Multiple  0   Live Births  2           Past Medical History:  Diagnosis Date   Complication of anesthesia    epidural with last pregnancy made entire left side numb, had to d/c   Depression    Headache    HIV-1 (human immunodeficiency virus I) (HCC)    HSV 06/28/2007   Hx of migraines    Multiple sclerosis (HCC)     Past Surgical History:  Procedure Laterality Date   BREAST SURGERY  2019   reduction   CESAREAN SECTION     CESAREAN SECTION  09/07/2011   Procedure: CESAREAN SECTION;  Surgeon: Lazaro Arms, MD;  Location: [facility];  Service: Gynecology;  Laterality: N/A;  Repeat cesarean section with delivery of baby boy at 63. Apgars 9/9.  Bilateral tubal ligation.   IUD REMOVAL  2005   in cervix   TUBAL LIGATION     tubal  reanastomosis  08/13/2016    Family History  Problem Relation Age of Onset   Alcohol abuse Mother    Drug abuse Mother    Depression Mother    Alcohol abuse Father    Drug abuse Father    Multiple sclerosis Sister    Mental illness Sister        bipolar ptsd   Schizophrenia Sister    Diabetes Maternal Grandmother    Hypertension Maternal Grandmother    Cancer Maternal Grandmother        ovarian, breast and lung   Depression Maternal Grandmother     Social History   Tobacco Use   Smoking status: Every Day    Packs/day: .25    Types: Cigarettes    Passive exposure: Current   Smokeless tobacco: Never   Tobacco comments:    Trying to quit due to pregnancy   Vaping Use   Vaping Use: Never used  Substance Use Topics   Alcohol use: Not Currently    Comment: occasional   Drug use: Not Currently    Types: Marijuana  Comment: patient reports last THC use beginning of April    Allergies:  Allergies  Allergen Reactions   Contrast Media [Iodinated Contrast Media] Nausea And Vomiting    Medications Prior to Admission  Medication Sig Dispense Refill Last Dose   acetaminophen (TYLENOL) 500 MG tablet Take 500 mg by mouth every 6 (six) hours as needed for mild pain.   10/22/2022   Prenatal Vit-Fe Fumarate-FA (PRENATAL MULTIVITAMIN) TABS tablet Take 1 tablet by mouth daily at 12 noon.   Past Month   cabotegravir & rilpivirine ER (CABENUVA) 600 & 900 MG/3ML injection Inject 1 kit into the muscle every 30 (thirty) days. 6 mL 1     Review of Systems  Constitutional:  Positive for chills. Negative for fever.  HENT:  Negative for congestion and sore throat.   Respiratory:  Positive for shortness of breath. Negative for cough.   Cardiovascular:  Positive for chest pain.  Gastrointestinal:  Positive for abdominal pain and nausea. Negative for diarrhea and vomiting.  Genitourinary:  Negative for dysuria, hematuria, urgency and vaginal bleeding.  Neurological:  Positive for dizziness  and headaches. Negative for syncope.   Physical Exam   Blood pressure 127/66, pulse 77, temperature 98.7 F (37.1 C), temperature source Oral, resp. rate 15, height 5\' 1"  (1.549 m), weight 91.1 kg, last menstrual period 08/09/2022, SpO2 99 %.  Physical Exam Vitals and nursing note reviewed.  Constitutional:      General: She is not in acute distress (tearful but appears comfortable).    Appearance: Normal appearance.  HENT:     Head: Normocephalic and atraumatic.  Cardiovascular:     Rate and Rhythm: Normal rate and regular rhythm.     Heart sounds: Normal heart sounds.  Pulmonary:     Effort: Pulmonary effort is normal. No respiratory distress.     Breath sounds: Normal breath sounds. No stridor. No wheezing, rhonchi or rales.  Abdominal:     General: There is no distension.     Palpations: Abdomen is soft. There is no mass.     Tenderness: There is abdominal tenderness in the suprapubic area. There is no guarding or rebound.     Hernia: No hernia is present.  Musculoskeletal:        General: Normal range of motion.     Cervical back: Normal range of motion.  Skin:    General: Skin is warm and dry.  Neurological:     General: No focal deficit present.     Mental Status: She is alert and oriented to person, place, and time.  Psychiatric:        Mood and Affect: Mood normal.        Behavior: Behavior normal.   Limited bedside US: viable, active fetus, FHR 167, subj. nml AFV  Results for orders placed or performed during the hospital encounter of 10/22/22 (from the past 24 hour(s))  Urinalysis, Routine w reflex microscopic -Urine, Clean Catch     Status: None   Collection Time: 10/22/22  6:20 PM  Result Value Ref Range   Color, Urine YELLOW YELLOW   APPearance CLEAR CLEAR   Specific Gravity, Urine 1.016 1.005 - 1.030   pH 7.0 5.0 - 8.0   Glucose, UA NEGATIVE NEGATIVE mg/dL   Hgb urine dipstick NEGATIVE NEGATIVE   Bilirubin Urine NEGATIVE NEGATIVE   Ketones, ur NEGATIVE  NEGATIVE mg/dL   Protein, ur NEGATIVE NEGATIVE mg/dL   Nitrite NEGATIVE NEGATIVE   Leukocytes,Ua NEGATIVE NEGATIVE  CBC  Status: Abnormal   Collection Time: 10/22/22  7:22 PM  Result Value Ref Range   WBC 19.6 (H) 4.0 - 10.5 K/uL   RBC 4.21 3.87 - 5.11 MIL/uL   Hemoglobin 11.7 (L) 12.0 - 15.0 g/dL   HCT 16.1 (L) 09.6 - 04.5 %   MCV 81.0 80.0 - 100.0 fL   MCH 27.8 26.0 - 34.0 pg   MCHC 34.3 30.0 - 36.0 g/dL   RDW 40.9 (H) 81.1 - 91.4 %   Platelets 365 150 - 400 K/uL   nRBC 0.0 0.0 - 0.2 %   MAU Course  Procedures Zofran Tylenol  MDM Labs ordered and reviewed.  RN reports pt HA and abd pain is relieved. Pt didn't report HA to me. She feels a lot better, no longer tearful, appears comfortable. No signs of acute process. Uterine fibroids seen on last [place], could be source of pain. Leukocytosis noted but no clear source. Discussed return precautions. Stable for discharge home. Assessment and Plan   1. [redacted] weeks gestation of pregnancy   2. Dizzy spells   3. Acute non intractable tension-type headache   4. Leiomyoma of uterus affecting pregnancy in first trimester   5. Leukocytosis, unspecified type    Discharge home [facility] up [facility] as scheduled SAB precautions Rx Zofran  Allergies as of 10/22/2022       Reactions   Contrast Media [iodinated Contrast Media] Nausea And Vomiting        Medication List     TAKE these medications    acetaminophen 500 MG tablet Commonly known as: TYLENOL Take 500 mg by mouth every 6 (six) hours as needed for mild pain.   cabotegravir & rilpivirine ER 600 & 900 MG/3ML injection Commonly known as: CABENUVA Inject 1 kit into the muscle every 30 (thirty) days.   ondansetron 4 MG disintegrating tablet Commonly known as: ZOFRAN-ODT Take 1 tablet (4 mg total) by mouth every 8 (eight) hours as needed for nausea or vomiting.   prenatal multivitamin Tabs tablet Take 1 tablet by mouth daily at 12 noon.        Donette Larry,  CNM 10/22/2022, 8:29 PM

## 2022-10-23 ENCOUNTER — Other Ambulatory Visit: Payer: Self-pay

## 2022-10-23 ENCOUNTER — Ambulatory Visit (INDEPENDENT_AMBULATORY_CARE_PROVIDER_SITE_OTHER): Payer: BC Managed Care – PPO | Admitting: Pharmacist

## 2022-10-23 DIAGNOSIS — Z21 Asymptomatic human immunodeficiency virus [HIV] infection status: Secondary | ICD-10-CM

## 2022-10-23 MED ORDER — CABOTEGRAVIR & RILPIVIRINE ER 600 & 900 MG/3ML IM SUER
1.0000 | Freq: Once | INTRAMUSCULAR | Status: AC
Start: 2022-10-23 — End: 2022-10-23
  Administered 2022-10-23: 1 via INTRAMUSCULAR

## 2022-10-23 NOTE — Progress Notes (Signed)
HPI: Candice [name] is a 37 y.o. female who presents to the [facility] pharmacy clinic for [facility] administration.  Patient Active Problem List   Diagnosis Date Noted   Supervision of multigravida of advanced maternal age 70/27/2024   High-risk pregnancy in first trimester 09/11/2022   Positive pregnancy test 09/10/2022   Pelvic pain 08/29/2022   Vitamin D deficiency 06/20/2022   Constipation 02/18/2022   Arthralgia 02/18/2022   Depression, major, single episode, mild (HCC) 01/13/2022   Cyst of vulva 09/19/2021   Frequent urination 09/11/2021   UTI symptoms 07/30/2021   Medication monitoring encounter 01/11/2021   MS (multiple sclerosis) (HCC) 05/21/2020   GERD (gastroesophageal reflux disease) 05/21/2020   Right-sided abdominal pain of unknown etiology 04/06/2020   Abnormal MRI, spine 04/06/2020   Metatarsalgia of right foot 04/06/2020   Healthcare maintenance 02/10/2020   Right leg numbness 02/07/2020   HIV (human immunodeficiency virus infection) (HCC) 09/07/2019   Hemorrhagic cyst of left ovary 10/14/2016   Abdominal pain 04/16/2016   MRSA colonization 09/25/2014   Major depressive disorder, recurrent episode, moderate (HCC) 06/08/2013   Genital herpes 02/16/2013   Back pain 10/13/2011   Vaginal discharge 10/10/2010   Cervical cancer screening 10/10/2010   OBESITY, NOS 08/20/2006    Patient's Medications  New Prescriptions   No medications on file  Previous Medications   ACETAMINOPHEN (TYLENOL) 500 MG TABLET    Take 500 mg by mouth every 6 (six) hours as needed for mild pain.   CABOTEGRAVIR & RILPIVIRINE ER (CABENUVA) 600 & 900 MG/3ML INJECTION    Inject 1 kit into the muscle every 30 (thirty) days.   ONDANSETRON (ZOFRAN-ODT) 4 MG DISINTEGRATING TABLET    Take 1 tablet (4 mg total) by mouth every 8 (eight) hours as needed for nausea or vomiting.   PRENATAL VIT-FE FUMARATE-FA (PRENATAL MULTIVITAMIN) TABS TABLET    Take 1 tablet by mouth daily at 12 noon.  Modified  Medications   No medications on file  Discontinued Medications   No medications on file    Allergies: Allergies  Allergen Reactions   Contrast Media [Iodinated Contrast Media] Nausea And Vomiting    Past Medical History: Past Medical History:  Diagnosis Date   Complication of anesthesia    epidural with last pregnancy made entire left side numb, had to d/c   Depression    Headache    HIV-1 (human immunodeficiency virus I) (HCC)    HSV 06/28/2007   Hx of migraines    Multiple sclerosis (HCC)     Social History: Social History   Socioeconomic History   Marital status: Single    Spouse name: Not on file   Number of children: 2   Years of education: 14   Highest education level: Not on file  Occupational History    Comment: Associate Professor  Tobacco Use   Smoking status: Every Day    Packs/day: .25    Types: Cigarettes    Passive exposure: Current   Smokeless tobacco: Never   Tobacco comments:    Trying to quit due to pregnancy   Vaping Use   Vaping Use: Never used  Substance and Sexual Activity   Alcohol use: Not Currently    Comment: occasional   Drug use: Not Currently    Types: Marijuana    Comment: patient reports last THC use beginning of April   Sexual activity: Not Currently    Partners: Male    Birth control/protection: None    Comment: Decline condoms  Other Topics Concern   Not on file  Social History Narrative   Works as Associate Professor.   FOB of both children lives in [place]; will be some what involved but mother is no longer dating him.   Social Determinants of Health   Financial Resource Strain: Not on file  Food Insecurity: Not on file  Transportation Needs: Not on file  Physical Activity: Not on file  Stress: Not on file  Social Connections: Not on file    Labs: Lab Results  Component Value Date   HIV1RNAQUANT Not Detected 09/11/2022   HIV1RNAQUANT <20 (H) 06/20/2022   HIV1RNAQUANT Not Detected 02/14/2022   CD4TABS 684  09/11/2022   CD4TABS 1,026 11/22/2020   CD4TABS 1,078 03/29/2020    RPR and STI Lab Results  Component Value Date   LABRPR Non Reactive 09/17/2022   LABRPR NON-REACTIVE 12/13/2021   LABRPR NON-REACTIVE 06/20/2021   LABRPR Non Reactive 10/25/2020   LABRPR NON-REACTIVE 09/08/2019    STI Results GC CT  09/17/2022 10:00 AM Negative  Negative   08/29/2022  3:43 PM Negative  Negative   02/14/2022  9:13 AM Negative    Negative  Negative    Negative   12/13/2021  9:09 AM Negative    Negative  Negative    Negative   07/29/2021  4:28 PM Negative  Negative   06/20/2021  9:20 AM Negative  Negative   12/21/2020  3:45 PM Negative  Negative   11/22/2020  9:58 AM Negative  Negative   10/25/2020  9:52 AM Negative  Negative   09/05/2019  4:31 PM Negative  Negative   10/16/2017 12:00 AM Negative  Negative   06/08/2017 12:00 AM Negative  Negative   10/14/2016 12:00 AM Negative  Negative   02/15/2016 12:00 AM Negative  Negative   07/13/2015 12:00 AM Negative  Negative   04/13/2015 12:00 AM Negative  Negative   09/24/2013 12:00 AM NG: Negative  CT: Negative   02/11/2011  3:36 PM  NEGATIVE   01/29/2010  7:00 PM  NEGATIVE   05/02/2009  8:17 PM  NEGATIVE     Hepatitis B Lab Results  Component Value Date   HEPBSAB REACTIVE (A) 09/08/2019   HEPBSAG Negative 09/17/2022   HEPBCAB NON-REACTIVE 09/08/2019   Hepatitis C Lab Results  Component Value Date   HEPCAB NON-REACTIVE 09/08/2019   Hepatitis A Lab Results  Component Value Date   HAV BORDERLINE (A) 09/08/2019   Lipids: Lab Results  Component Value Date   CHOL 183 11/22/2020   TRIG 80 11/22/2020   HDL 51 11/22/2020   CHOLHDL 3.6 11/22/2020   VLDL 10 02/14/2013   LDLCALC 115 (H) 11/22/2020    TARGET DATE:  The 25th of the month  Current HIV Regimen: Cabenuva  Assessment: Shalece presents today for their maintenance Cabenuva injections. Initial/past injections were tolerated well without issues. No problems with systemic  effects of injections. Noted a large knot on her right side when palpating before injection; stated it was painful on palpation but otherwise had not noticed prior to today. Avoided the area with this knot during administration.    Administered cabotegravir 600mg /52mL in left upper outer quadrant of the gluteal muscle. Administered rilpivirine 900 mg/32mL in the right upper outer quadrant of the gluteal muscle. Monitored patient for 10 minutes after injection. Injections were tolerated well without issue. Patient will follow up in 2 months for next injection. Will defer HIV RNA until next visit.  Patient is currently [redacted] weeks pregnant per her  report today. She had discussed her HIV treatment options during pregnancy with Tammy Sours at their visit in March, and she would like to continue on [place] in pregnancy. Discussed the lack of clinical studies demonstrating safety or efficacy when utilized in pregnancy. Will add her to the HIV pregnancy registry. Scheduled follow up with Tammy Sours in one month to consider appropriate Cabenuva dosing schedules as she enters her second and third trimesters; she will be transitioning into her second trimester ~14 weeks when she follows up with him in 4 weeks. Discussed that she will most likely transition to [place] monthly dosing per Greg's recommendation at this next visit and continue this dosing through the remainder of pregnancy. Discussed we will likely assess her viral load at her follow-up visit next month to ensure she remains undetectable despite volume of distribution changes with pregnancy. Will complete any needed approvals for monthly Cabenuva once Tammy Sours confirms transition.   Plan: - Cabenuva injections administered - Follow up with Tammy Sours on 4/24  - Call with any issues or questions  Margarite Gouge, PharmD, CPP, BCIDP, AAHIVP Clinical Pharmacist Practitioner Infectious Diseases Clinical Pharmacist [facility]

## 2022-10-27 ENCOUNTER — Telehealth: Payer: Self-pay | Admitting: Pharmacist

## 2022-10-27 NOTE — Telephone Encounter (Signed)
Per Tammy Sours, will transition patient to monthly Cabenuva when she returns to see him at the end of May as she will enter her second and third trimester. Tammy Sours discussed below points with her during his visit in March, and I reiterated the same points with her last week.   Lupita Leash, she will return on 5/24 - can you have some monthly buy and bill for her?   Patient is currently ~[redacted] weeks pregnant, so we wanted to have a discussion with her about her current HIV therapy and the plans during her pregnancy. She is currently on Cabenuva for her HIV. We discussed that there is little data currently for the use of Cabenuva in pregnant patients. We shared that in a recent study there were only 20-25 patients out of thousands who became pregnant during the study and stayed on [place]. We mentioned side effects and the outcomes of the pregnancies such as very little instance of birth defects in the babies born and that some stillbirths were reported.  We also discussed the importance of the volume of distribution for Cabenuva in regards to pregnancy. The patient will experience volume changes as the pregnancy progresses and we explained the concern for not reaching therapeutic levels of drug if we did the 63-month dosing, especially in the 2nd and 3rd trimesters. We suggested the idea of changing to a 63-month dosing once we reach that time frame.  Reiterated these points above but left the decision up to the patient, explaining how we are informing her of the options because we want her to make a fully informed decision and that we would never force one treatment option upon her. She is agreeable to continue Cabenuva and prefers to receive a long-acting injectable therapy rather than oral therapy during her pregnancy.  Margarite Gouge, PharmD, CPP, BCIDP, AAHIVP Clinical Pharmacist Practitioner Infectious Diseases Clinical Pharmacist [facility]

## 2022-10-27 NOTE — Telephone Encounter (Signed)
I have some in the fridge already

## 2022-11-03 ENCOUNTER — Other Ambulatory Visit: Payer: Self-pay | Admitting: Pharmacist

## 2022-11-03 ENCOUNTER — Telehealth: Payer: Self-pay

## 2022-11-03 ENCOUNTER — Other Ambulatory Visit: Payer: Self-pay

## 2022-11-03 ENCOUNTER — Other Ambulatory Visit (HOSPITAL_COMMUNITY): Payer: Self-pay

## 2022-11-03 DIAGNOSIS — Z21 Asymptomatic human immunodeficiency virus [HIV] infection status: Secondary | ICD-10-CM

## 2022-11-03 MED ORDER — CABOTEGRAVIR & RILPIVIRINE ER 600 & 900 MG/3ML IM SUER
1.0000 | INTRAMUSCULAR | 5 refills | Status: DC
Start: 2022-11-03 — End: 2022-11-14
  Filled 2022-11-03 (×2): qty 6, 30d supply, fill #0

## 2022-11-03 NOTE — Progress Notes (Signed)
Transitioning to monthly dosing while in second and third trimester. Lupita Leash completed PA for pharmacy benefits today.  Margarite Gouge, PharmD, CPP, BCIDP, AAHIVP Clinical Pharmacist Practitioner Infectious Diseases Clinical Pharmacist [facility]

## 2022-11-03 NOTE — Telephone Encounter (Signed)
Pharmacy Patient Advocate EncounterRenaldo [name] BIV-Pharmacy Benefit:  CABENUVA 400&600MG  (MONTHLY)  PA was submitted to [employer] and has been approved through: 11/03/22-11/03/23 Authorization# [id number]   Please send prescription to Specialty Pharmacy: [facility]: [phone]  Estimated Copay is: 0.00  Clearance Coots, CPhT Specialty Pharmacy Patient Care [facility] Phone: [phone] Fax:  [phone]

## 2022-11-03 NOTE — Progress Notes (Unsigned)
Candice [name] D.[name] [address] [place] [address] Phone: [phone]   Assessment and Plan:     There are no diagnoses linked to this encounter.  *** - Patient has received significant relief with OMT in the past.  Elects for repeat OMT today.  Tolerated well per note below. - Decision today to treat with OMT was based on Physical Exam   After verbal consent patient was treated with HVLA (high velocity low amplitude), ME (muscle energy), FPR (flex positional release), ST (soft tissue), PC/PD (Pelvic Compression/ Pelvic Decompression) techniques in cervical, rib, thoracic, lumbar, and pelvic areas. Patient tolerated the procedure well with improvement in symptoms.  Patient educated on potential side effects of soreness and recommended to rest, hydrate, and use Tylenol as needed for pain control.   Pertinent previous records reviewed include ***   Follow Up: ***     Subjective:   I, Candice [name], am serving as a Neurosurgeon for Doctor Richardean Sale   Chief Complaint: thoracic back pain    HPI:    02/17/2022 Patient is a 37 year old female complaining of thoracic back pain. Patient states  Beginning around 2018, she began having intermittent episodes of numbness, weakness and dizziness.  In May or June of that year, she began having a recurrence of symptoms, including right flank pain and numbness radiating down her right leg with some associated right leg weakness.  She felt off balance.  She saw neurology at that time.  MRI of thoracic spine revealed subtle enhancing hyperintense focus at the T8-T9 region.would like to discuss OMT    03/10/2022 Patient states that she is alright, left side back is still tight and shoulders    04/07/2022 Patient states that she is okay left side is still crazy even with PT   05/05/2022 Patient states she is good needs a tune up   06/02/2022 Patient states that she is pretty good    06/30/2022 Patient states  left side is a problem , PT told her to re check rotator cuff   07/22/2022 Patient states she is pretty good, CSI was a life saver    09/09/2022 Patient states that she has been having intermittent flares    10/07/2022 Patient states that she would still like to get OMT while pregnant    11/04/2022 Patient states    Relevant Historical Information: Multiple sclerosis  Additional pertinent review of systems negative.  Current Outpatient Medications  Medication Sig Dispense Refill   acetaminophen (TYLENOL) 500 MG tablet Take 500 mg by mouth every 6 (six) hours as needed for mild pain.     cabotegravir & rilpivirine ER (CABENUVA) 600 & 900 MG/3ML injection Inject 1 kit into the muscle every 30 (thirty) days. 6 mL 1   ondansetron (ZOFRAN-ODT) 4 MG disintegrating tablet Take 1 tablet (4 mg total) by mouth every 8 (eight) hours as needed for nausea or vomiting. 20 tablet 0   Prenatal Vit-Fe Fumarate-FA (PRENATAL MULTIVITAMIN) TABS tablet Take 1 tablet by mouth daily at 12 noon.     No current facility-administered medications for this visit.      Objective:     There were no vitals filed for this visit.    There is no height or weight on file to calculate BMI.    Physical Exam:     General: Well-appearing, cooperative, sitting comfortably in no acute distress.   OMT Physical Exam:  ASIS Compression Test: Positive Right Cervical: TTP paraspinal, ***  Rib: Bilateral elevated first rib with TTP Thoracic: TTP paraspinal,*** Lumbar: TTP paraspinal,*** Pelvis: Right anterior innominate  Electronically signed by:  Candice [name] D.[name] 7:25 AM 11/03/22

## 2022-11-04 ENCOUNTER — Ambulatory Visit (INDEPENDENT_AMBULATORY_CARE_PROVIDER_SITE_OTHER): Payer: BC Managed Care – PPO | Admitting: Sports Medicine

## 2022-11-04 VITALS — HR 102 | Ht 61.0 in | Wt 206.0 lb

## 2022-11-04 DIAGNOSIS — S46812D Strain of other muscles, fascia and tendons at shoulder and upper arm level, left arm, subsequent encounter: Secondary | ICD-10-CM | POA: Diagnosis not present

## 2022-11-04 DIAGNOSIS — M546 Pain in thoracic spine: Secondary | ICD-10-CM | POA: Diagnosis not present

## 2022-11-04 DIAGNOSIS — Z3A12 12 weeks gestation of pregnancy: Secondary | ICD-10-CM

## 2022-11-04 DIAGNOSIS — G8929 Other chronic pain: Secondary | ICD-10-CM

## 2022-11-04 DIAGNOSIS — M542 Cervicalgia: Secondary | ICD-10-CM

## 2022-11-04 DIAGNOSIS — M25512 Pain in left shoulder: Secondary | ICD-10-CM

## 2022-11-04 NOTE — Patient Instructions (Signed)
Good to see you Recommen continue HEP  Continue tylenol for day to day pay

## 2022-11-05 ENCOUNTER — Telehealth: Payer: Self-pay | Admitting: Pharmacist

## 2022-11-05 NOTE — Telephone Encounter (Signed)
Patient's medication Renaldo Harrison) has been delivered to [facility] from [facility] and will be administered at patient's next office visit on 11/14/22.  Zaeda Mcferran L. Branson Kranz, PharmD [facility] Clinical Pharmacist Practitioner

## 2022-11-10 ENCOUNTER — Encounter: Payer: Self-pay | Admitting: Obstetrics & Gynecology

## 2022-11-10 ENCOUNTER — Other Ambulatory Visit: Payer: Self-pay

## 2022-11-10 ENCOUNTER — Ambulatory Visit (INDEPENDENT_AMBULATORY_CARE_PROVIDER_SITE_OTHER): Payer: BC Managed Care – PPO | Admitting: Obstetrics & Gynecology

## 2022-11-10 VITALS — BP 127/76 | HR 92 | Wt 203.7 lb

## 2022-11-10 DIAGNOSIS — Z3A13 13 weeks gestation of pregnancy: Secondary | ICD-10-CM

## 2022-11-10 DIAGNOSIS — Z21 Asymptomatic human immunodeficiency virus [HIV] infection status: Secondary | ICD-10-CM

## 2022-11-10 DIAGNOSIS — G35D Multiple sclerosis, unspecified: Secondary | ICD-10-CM

## 2022-11-10 DIAGNOSIS — G35 Multiple sclerosis: Secondary | ICD-10-CM | POA: Diagnosis not present

## 2022-11-10 DIAGNOSIS — O0990 Supervision of high risk pregnancy, unspecified, unspecified trimester: Secondary | ICD-10-CM | POA: Insufficient documentation

## 2022-11-10 DIAGNOSIS — O0991 Supervision of high risk pregnancy, unspecified, first trimester: Secondary | ICD-10-CM | POA: Diagnosis not present

## 2022-11-10 DIAGNOSIS — B20 Human immunodeficiency virus [HIV] disease: Secondary | ICD-10-CM | POA: Diagnosis not present

## 2022-11-10 DIAGNOSIS — D563 Thalassemia minor: Secondary | ICD-10-CM

## 2022-11-10 NOTE — Patient Instructions (Signed)

## 2022-11-10 NOTE — Progress Notes (Unsigned)
History:   Candice [name] is a 37 y.o. G3P2002 at [redacted]w[redacted]d by LMP being seen today for her first obstetrical visit.  Her obstetrical history is significant for  HIV well controlled on medication . Patient does not intend to breast feed. Pregnancy history fully reviewed.  Patient reports no complaints.      HISTORY: OB History  Gravida Para Term Preterm AB Living  3 2 2  0 0 2  SAB IAB Ectopic Multiple Live Births  0 0 0 0 2    # Outcome Date GA Lbr Len/2nd Weight Sex Delivery Anes PTL Lv  3 Current           2 Term 09/07/11 [redacted]w[redacted]d  6 lb 14.4 oz (3.13 kg) M CS-Vac EPI  LIV     Birth Comments: C/S for failure to descend Mom with h/o THC use until >[redacted] weeks pregnant, continued tobacco use.     Name: Candice [name]     Apgar1: 8  Apgar5: 9  1 Term 07/25/07 [redacted]w[redacted]d  7 lb 8 oz (3.402 kg) F CS-LTranv EPI N LIV    Last pap smear was done 10/2020 and was normal  Past Medical History:  Diagnosis Date   Complication of anesthesia    epidural with last pregnancy made entire left side numb, had to d/c   Depression    Headache    HIV-1 (human immunodeficiency virus I) (HCC)    HSV 06/28/2007   Hx of migraines    Multiple sclerosis (HCC)    Past Surgical History:  Procedure Laterality Date   BREAST SURGERY  2019   reduction   CESAREAN SECTION     CESAREAN SECTION  09/07/2011   Procedure: CESAREAN SECTION;  Surgeon: Lazaro Arms, MD;  Location: [facility];  Service: Gynecology;  Laterality: N/A;  Repeat cesarean section with delivery of baby boy at 82. Apgars 9/9.  Bilateral tubal ligation.   IUD REMOVAL  2005   in cervix   TUBAL LIGATION     tubal reanastomosis  08/13/2016   Family History  Problem Relation Age of Onset   Alcohol abuse Mother    Drug abuse Mother    Depression Mother    Alcohol abuse Father    Drug abuse Father    Multiple sclerosis Sister    Mental illness Sister        bipolar ptsd   Schizophrenia Sister    Diabetes Maternal Grandmother     Hypertension Maternal Grandmother    Cancer Maternal Grandmother        ovarian, breast and lung   Depression Maternal Grandmother    Social History   Tobacco Use   Smoking status: Every Day    Packs/day: .25    Types: Cigarettes    Passive exposure: Current   Smokeless tobacco: Never   Tobacco comments:    Trying to quit due to pregnancy   Vaping Use   Vaping Use: Never used  Substance Use Topics   Alcohol use: Not Currently    Comment: occasional   Drug use: Not Currently    Types: Marijuana    Comment: patient reports last THC use beginning of April   Allergies  Allergen Reactions   Contrast Media [Iodinated Contrast Media] Nausea And Vomiting   Current Outpatient Medications on File Prior to Visit  Medication Sig Dispense Refill   cabotegravir & rilpivirine ER (CABENUVA) 600 & 900 MG/3ML injection Inject 1 kit into the muscle every 30 (thirty) days. 6  mL 5   ondansetron (ZOFRAN-ODT) 4 MG disintegrating tablet Take 1 tablet (4 mg total) by mouth every 8 (eight) hours as needed for nausea or vomiting. 20 tablet 0   Prenatal Vit-Fe Fumarate-FA (PRENATAL MULTIVITAMIN) TABS tablet Take 1 tablet by mouth daily at 12 noon.     acetaminophen (TYLENOL) 500 MG tablet Take 500 mg by mouth every 6 (six) hours as needed for mild pain. (Patient not taking: Reported on 11/10/2022)     No current facility-administered medications on file prior to visit.    Review of Systems Pertinent items noted in HPI and remainder of comprehensive ROS otherwise negative.  Indications for ASA therapy (per UpToDate) One of the following: Previous pregnancy with preeclampsia, especially early onset and with an adverse outcome No Multifetal gestation No Chronic hypertension No Type 1 or 2 diabetes mellitus No Chronic kidney disease No Autoimmune disease (antiphospholipid syndrome, systemic lupus erythematosus) No Two or more of the following: Nulliparity No Obesity (body mass index >30 kg/m2)  Yes Family history of preeclampsia in mother or sister No Age ?35 years Yes Sociodemographic characteristics (African American race, low socioeconomic level) Yes Personal risk factors (eg, previous pregnancy with low birth weight or small for gestational age infant, previous adverse pregnancy outcome [eg, stillbirth], interval >10 years between pregnancies) Yes HIV controlled on medication Indications for early GDM screening (FBS, A1C, Random CBG, GTT) First-degree relative with diabetes No BMI >30kg/m2 Yes Age > 35 Yes Previous birth of an infant weighing ?4000 g No Gestational diabetes mellitus in a previous pregnancy No Glycated hemoglobin ?5.7 percent (39 mmol/mol), impaired glucose tolerance, or impaired fasting glucose on previous testing No High-risk race/ethnicity (eg, African American, Latino, Native American, Asian American, Pacific Islander) Yes Previous stillbirth of unknown cause No Maternal birthweight > 9 lbs No History of cardiovascular disease No Hypertension or on therapy for hypertension No High-density lipoprotein cholesterol level <35 mg/dL (1.61 mmol/L) and/or a triglyceride level >250 mg/dL (0.96 mmol/L) No Polycystic ovary syndrome No Physical inactivity No Other clinical condition associated with insulin resistance (eg, severe obesity, acanthosis nigricans) No Current use of glucocorticoids No   Physical Exam:   Vitals:   11/10/22 1511  BP: 127/76  Pulse: 92  Weight: 203 lb 11.2 oz (92.4 kg)   Fetal Heart Rate (bpm): 153  Uterine size:   153bpm Patient informed that the ultrasound is considered a limited obstetric ultrasound and is not intended to be a complete ultrasound exam.  Patient also informed that the ultrasound is not being completed with the intent of assessing for fetal or placental anomalies or any pelvic abnormalities.  Explained that the purpose of today's ultrasound is to assess for fetal heart rate.  Patient acknowledges the purpose of the  exam and the limitations of the study. General: well-developed, well-nourished female in no acute distress  Breasts:  normal appearance, no masses or tenderness bilaterally, exam done in the presence of a chaperone.   Skin: normal coloration and turgor, no rashes  Neurologic: oriented, normal, negative, normal mood  Extremities: normal strength, tone, and muscle mass, ROM of all joints is normal  HEENT PERRLA, extraocular movement intact and sclera clear, anicteric  Neck supple and no masses  Cardiovascular: regular rate and rhythm  Respiratory:  no respiratory distress, normal breath sounds  Abdomen: soft, non-tender; bowel sounds normal; no masses,  no organomegaly       Assessment:    Pregnancy: [id number] Patient Active Problem List   Diagnosis Date Noted   Supervision  of high risk pregnancy, antepartum 11/10/2022   High-risk pregnancy in first trimester 09/11/2022   Pelvic pain 08/29/2022   Vitamin D deficiency 06/20/2022   Arthralgia 02/18/2022   Depression, major, single episode, mild (HCC) 01/13/2022   Frequent urination 09/11/2021   UTI symptoms 07/30/2021   MS (multiple sclerosis) (HCC) 05/21/2020   GERD (gastroesophageal reflux disease) 05/21/2020   Abnormal MRI, spine 04/06/2020   Right leg numbness 02/07/2020   HIV-1 (human immunodeficiency virus I) (HCC) 09/07/2019   Major depressive disorder, recurrent episode, moderate (HCC) 06/08/2013   Genital herpes 02/16/2013   Back pain 10/13/2011   OBESITY, NOS 08/20/2006     Plan:    1. MS (multiple sclerosis) (HCC)   2. Supervision of high risk pregnancy, antepartum  - [place] MFM OB DETAIL +14 WK; Future - Panorama Prenatal Test Full Panel - HORIZON Basic Panel - Hemoglobin A1c  3. HIV-1 (human immunodeficiency virus I) (HCC) Managed by ID   Initial labs drawn. Continue prenatal vitamins. Problem list reviewed and updated. Genetic Screening discussed, Panorama and Horizon: ordered. Ultrasound discussed; fetal  anatomic survey: scheduled. Anticipatory guidance about prenatal visits given including labs, ultrasounds, and testing. Weight gain recommendations per IOM guidelines reviewed: underweight/BMI 18.5 or less > 28 - 40 lbs; normal weight/BMI 18.5 - 24.9 > 25 - 35 lbs; overweight/BMI 25 - 29.9 > 15 - 25 lbs; obese/BMI  30 or more > 11 - 20 lbs. Discussed usage of the Babyscripts app for more information about pregnancy, and to track blood pressures. Also discussed usage of virtual visits as additional source of managing and completing prenatal visits.  Patient was encouraged to use MyChart to review results, send requests, and have questions addressed.   The nature of [facility] with multiple MDs and Advanced Practice Providers was explained to patient; also emphasized that residents, students are part of our team. Routine obstetric precautions reviewed. Encouraged to seek out care at our office or emergency room [facility] preferred) for urgent and/or emergent concerns. Return in about 4 weeks (around 12/08/2022).     Adam Phenix, MD Obstetrician & Gynecologist, [facility], [facility]

## 2022-11-11 LAB — HEMOGLOBIN A1C
Est. average glucose Bld gHb Est-mCnc: 123 mg/dL
Hgb A1c MFr Bld: 5.9 % — ABNORMAL HIGH (ref 4.8–5.6)

## 2022-11-12 NOTE — BH Specialist Note (Signed)
Integrated Behavioral Health via Telemedicine Visit  11/26/2022 DEBHORA BAZURTO [id number]  Number of Integrated Behavioral Health Clinician visits: 1- Initial Visit  Session Start time: 1428   Session End time: 1508  Total time in minutes: 40   Referring Provider: Scheryl Darter, MD Patient/Family location: Home [facility] Provider location: [facility]  All persons participating in visit: Patient Candice [name] and [facility] Rutten   Types of Service: Individual psychotherapy and Video visit  I connected with Leanne L Vanostrand and/or Darlyn L Britten's  n/a  via  Telephone or Video Enabled Telemedicine Application  (Video is Caregility application) and verified that I am speaking with the correct person using two identifiers. Discussed confidentiality: Yes   I discussed the limitations of telemedicine and the availability of in person appointments.  Discussed there is a possibility of technology failure and discussed alternative modes of communication if that failure occurs.  I discussed that engaging in this telemedicine visit, they consent to the provision of behavioral healthcare and the services will be billed under their insurance.  Patient and/or legal guardian expressed understanding and consented to Telemedicine visit: Yes   Presenting Concerns: Patient and/or family reports the following symptoms/concerns: Increased anxiety and worry, attributed to behavior of FOB; pt is considering filing restraining order to prevent him from showing up at her work and causing problems. Pt is coping with support friends/family and coworkers; open to implementing additional self-coping strategy today.  Duration of problem: Current pregnancy; Severity of problem: moderate  Patient and/or Family's Strengths/Protective Factors: Social connections, Concrete supports in place (healthy food, safe environments, etc.), and Sense of  purpose  Goals Addressed: Patient will:  Reduce symptoms of: anxiety and stress   Increase knowledge and/or ability of: healthy habits and stress reduction   Demonstrate ability to: Increase healthy adjustment to current life circumstances and Increase adequate support systems for patient/family  Progress towards Goals: Ongoing  Interventions: Interventions utilized:  Mindfulness or Management consultant, Psychoeducation and/or Health Education, and Link to [employer] Standardized Assessments completed: Not Needed  Patient and/or Family Response: Patient agrees with treatment plan.   Assessment: Patient currently experiencing Adjustment disorder with anxious mood.   Patient may benefit from psychoeducation and brief therapeutic interventions regarding coping with symptoms of anxiety.   Plan: Follow up with behavioral health clinician on : Three weeks Behavioral recommendations:  -Continue taking prenatal vitamin daily -CALM relaxation breathing exercise twice daily (morning; at bedtime with sleep sounds); as needed throughout the day. -Consider additional resources on After Visit Summary; use as needed and discussed Referral(s): Integrated Art gallery manager (In Clinic) and [employer] Resources:  DV; Childcare  I discussed the assessment and treatment plan with the patient and/or parent/guardian. They were provided an opportunity to ask questions and all were answered. They agreed with the plan and demonstrated an understanding of the instructions.   They were advised to call back or seek an in-person evaluation if the symptoms worsen or if the condition fails to improve as anticipated.  [name], LCSW     11/10/2022    3:46 PM 10/14/2022    3:09 PM 09/17/2022    9:05 AM 09/11/2022    9:22 AM 09/10/2022   10:50 AM  Depression screen PHQ 2/9  Decreased Interest 1 1 0 0 1  Down, Depressed, Hopeless 0 0 0 0 0  PHQ - 2 Score 1 1 0 0 1  Altered sleeping 1 1  1  2  Tired, decreased energy 1 2 1  3   Change in appetite 1 1 1  2   Feeling bad or failure about yourself  1  0  0  Trouble concentrating 0 0 0  0  Moving slowly or fidgety/restless 0 0 0  0  Suicidal thoughts 0 0 0  0  PHQ-9 Score 5 5 3  8       11/10/2022    3:50 PM 06/10/2021    2:30 PM 04/28/2016   10:03 AM  GAD 7 : Generalized Anxiety Score  Nervous, Anxious, on Edge 1 3 1   Control/stop worrying 2 3 1   Worry too much - different things 3 3 2   Trouble relaxing 2 3 2   Restless 0 2 1  Easily annoyed or irritable 1 3 2   Afraid - awful might happen 0 2 0  Total GAD 7 Score 9 19 9   Anxiety Difficulty  Very difficult Somewhat difficult

## 2022-11-14 ENCOUNTER — Other Ambulatory Visit: Payer: Self-pay

## 2022-11-14 ENCOUNTER — Encounter (INDEPENDENT_AMBULATORY_CARE_PROVIDER_SITE_OTHER): Payer: BC Managed Care – PPO | Admitting: Family

## 2022-11-14 ENCOUNTER — Encounter: Payer: Self-pay | Admitting: Family

## 2022-11-14 ENCOUNTER — Other Ambulatory Visit (HOSPITAL_COMMUNITY): Payer: Self-pay

## 2022-11-14 ENCOUNTER — Other Ambulatory Visit: Payer: Self-pay | Admitting: Pharmacist

## 2022-11-14 VITALS — BP 121/80 | HR 86 | Temp 97.8°F | Ht 61.0 in | Wt 204.0 lb

## 2022-11-14 DIAGNOSIS — Z21 Asymptomatic human immunodeficiency virus [HIV] infection status: Secondary | ICD-10-CM

## 2022-11-14 MED ORDER — CABENUVA 400 & 600 MG/2ML IM SUER
1.0000 | INTRAMUSCULAR | 5 refills | Status: DC
Start: 2022-11-14 — End: 2022-11-18
  Filled 2022-11-14 (×2): qty 4, 30d supply, fill #0

## 2022-11-14 MED ORDER — CABENUVA 400 & 600 MG/2ML IM SUER
1.0000 | INTRAMUSCULAR | 5 refills | Status: DC
Start: 2022-11-14 — End: 2022-11-14
  Filled 2022-11-14 (×2): qty 6, 30d supply, fill #0

## 2022-11-14 MED ORDER — CABOTEGRAVIR & RILPIVIRINE ER 400 & 600 MG/2ML IM SUER
1.0000 | Freq: Once | INTRAMUSCULAR | Status: DC
Start: 2022-11-14 — End: 2023-07-20

## 2022-11-18 ENCOUNTER — Other Ambulatory Visit: Payer: Self-pay

## 2022-11-18 ENCOUNTER — Other Ambulatory Visit (HOSPITAL_COMMUNITY): Payer: Self-pay

## 2022-11-18 LAB — PANORAMA PRENATAL TEST FULL PANEL:PANORAMA TEST PLUS 5 ADDITIONAL MICRODELETIONS: FETAL FRACTION: 7.7

## 2022-11-18 MED ORDER — CABENUVA 400 & 600 MG/2ML IM SUER
1.0000 | INTRAMUSCULAR | 5 refills | Status: DC
Start: 2022-11-18 — End: 2023-05-07
  Filled 2022-11-18: qty 4, 30d supply, fill #0
  Filled 2022-12-08: qty 4, 30d supply, fill #1
  Filled 2023-01-01: qty 4, 30d supply, fill #2
  Filled 2023-02-13: qty 4, 30d supply, fill #3
  Filled 2023-02-24: qty 4, 30d supply, fill #4
  Filled 2023-04-09: qty 4, 30d supply, fill #5

## 2022-11-18 NOTE — Progress Notes (Signed)
Error

## 2022-11-19 NOTE — Progress Notes (Signed)
Candice [name] D.[name] [address] [place] [address] Phone: [phone]   Assessment and Plan:     1. Neck pain 2. Chronic bilateral thoracic back pain 3. Somatic dysfunction of cervical region 4. Somatic dysfunction of thoracic region 5. Somatic dysfunction of rib region 6. [redacted] weeks gestation of pregnancy  -Chronic with exacerbation, subsequent visit - Overall improvement in left shoulder and trapezius pain after trigger point injections at previous office visit.  Patient's pain has returned to baseline levels in neck and upper back and requesting OMT today - Patient has received significant relief with OMT in the past.  Elects for repeat OMT today.  Tolerated well per note below. - Decision today to treat with OMT was based on Physical Exam   After verbal consent patient was treated with HVLA (high velocity low amplitude), ME (muscle energy), FPR (flex positional release), ST (soft tissue), PC/PD (Pelvic Compression/ Pelvic Decompression) techniques in cervical, rib, thoracic,  areas. Patient tolerated the procedure well with improvement in symptoms.  Patient educated on potential side effects of soreness and recommended to rest, hydrate, and use Tylenol as needed for pain control.   Pertinent previous records reviewed include none   Follow Up: 4 weeks for reevaluation.  Could consider repeat OMT   Subjective:   I, Candice [name], am serving as a Neurosurgeon for Doctor Richardean Sale   Chief Complaint: thoracic back pain    HPI:    02/17/2022 Patient is a 37 year old female complaining of thoracic back pain. Patient states  Beginning around 2018, she began having intermittent episodes of numbness, weakness and dizziness.  In May or June of that year, she began having a recurrence of symptoms, including right flank pain and numbness radiating down her right leg with some associated right leg weakness.  She felt off balance.  She saw neurology at that  time.  MRI of thoracic spine revealed subtle enhancing hyperintense focus at the T8-T9 region.would like to discuss OMT    03/10/2022 Patient states that she is alright, left side back is still tight and shoulders    04/07/2022 Patient states that she is okay left side is still crazy even with PT   05/05/2022 Patient states she is good needs a tune up   06/02/2022 Patient states that she is pretty good    06/30/2022 Patient states left side is a problem , PT told her to re check rotator cuff   07/22/2022 Patient states she is pretty good, CSI was a life saver    09/09/2022 Patient states that she has been having intermittent flares    10/07/2022 Patient states that she would still like to get OMT while pregnant    11/04/2022 Patient states left side is  jacked up again    11/27/2022 Patient states shoulder neck and regular adjustment      Relevant Historical Information: Multiple sclerosis  Additional pertinent review of systems negative.  Current Outpatient Medications  Medication Sig Dispense Refill   acetaminophen (TYLENOL) 500 MG tablet Take 500 mg by mouth every 6 (six) hours as needed for mild pain.     cabotegravir & rilpivirine ER (CABENUVA) 400 & 600 MG/2ML injection Inject 1 kit into the muscle every 30 (thirty) days. 4 mL 5   ondansetron (ZOFRAN-ODT) 4 MG disintegrating tablet Take 1 tablet (4 mg total) by mouth every 8 (eight) hours as needed for nausea or vomiting. 20 tablet 0   Prenatal Vit-Fe Fumarate-FA (PRENATAL MULTIVITAMIN)  TABS tablet Take 1 tablet by mouth daily at 12 noon.     Current Facility-Administered Medications  Medication Dose Route Frequency Provider Last Rate Last Admin   cabotegravir & rilpivirine ER (CABENUVA) 400 & 600 MG/2ML injection 1 kit  1 kit Intramuscular Once Jeanine Luz D, FNP          Objective:     Vitals:   11/27/22 0901  BP: 122/80  Pulse: (!) 108  SpO2: 99%  Weight: 212 lb (96.2 kg)  Height: 5\' 1"  (1.549 m)       Body mass index is 40.06 kg/m.    Physical Exam:     General: Well-appearing, cooperative, sitting comfortably in no acute distress.   OMT Physical Exam:  ASIS Compression Test: Positive Right Cervical: TTP paraspinal, C3 RR SL, C6 RLSR Rib: Bilateral elevated first rib with TTP Thoracic: TTP paraspinal, T5-7 RRSL    Electronically signed by:  Candice [name] D.[name] 9:44 AM 11/27/22

## 2022-11-21 LAB — HORIZON CUSTOM: REPORT SUMMARY: POSITIVE — AB

## 2022-11-24 ENCOUNTER — Telehealth: Payer: Self-pay | Admitting: Obstetrics and Gynecology

## 2022-11-24 ENCOUNTER — Ambulatory Visit: Payer: BC Managed Care – PPO | Attending: Obstetrics and Gynecology

## 2022-11-24 NOTE — Telephone Encounter (Signed)
Phone call to Candice [name] when she did not arrive for her virtual genetic counseling appointment today.  There was no answer and no voicemail picked up.  If she would like to reschedule this genetic counseling visit she can contact our office at [phone].  Cherly Anderson, MS, CGC

## 2022-11-26 ENCOUNTER — Ambulatory Visit (INDEPENDENT_AMBULATORY_CARE_PROVIDER_SITE_OTHER): Payer: Self-pay | Admitting: Clinical

## 2022-11-26 DIAGNOSIS — F4322 Adjustment disorder with anxiety: Secondary | ICD-10-CM

## 2022-11-26 NOTE — Patient Instructions (Signed)
[facility] at [facility] [address] [place], [place] [address] [phone] (main office) [phone] (Caycee Wanat's office)  [place] Child Counsellor  (Childcare options, Early childcare development, etc.) DietDisorder.cz  [employer] Child Care Facility Search Engine  [email or web address]  [facility])  [address], [place], [place] [address] [phone] [email or web address]  [facility]:  [address] 2nd floor, [place], [place] [address] [phone])  Main line [phone]  [place] location **Parking is available in the [address] parking deck.  [place]:  [address] [place], [place] [address] [phone]) Main line [phone]  [place] location  **Located on the backside of the [facility]. Limited parking is available off of [address]  [place] hours: Monday -Friday 8:30am-4:30pm  [email or web address]    If immediate emergency, call 9-1-1, or Family Service of the [place] 24 hour crisis hotline at: 3054563688 [employer] and Websites Here are a few free apps meant to help you to help yourself.  To find, try searching on the internet to see if the app is offered on Apple/Android devices. If your first choice doesn't come up on your device, the good news is that there are many choices! Play around with different apps to see which ones are helpful to you.    Calm This is an app meant to help increase calm feelings. Includes info, strategies, and tools for tracking your feelings.      Calm Harm  This app is meant to help with self-harm. Provides many 5-minute or 15-min coping strategies for doing instead of hurting yourself.       Healthy Minds Health Minds is a problem-solving tool to help  deal with emotions and cope with stress you encounter wherever you are.      MindShift This app can help people cope with anxiety. Rather than trying to avoid anxiety, you can make an important shift and face it.      MY3  MY3 features a support system, safety plan and resources with the goal of offering a tool to use in a time of need.       My Life My Voice  This mood journal offers a simple solution for tracking your thoughts, feelings and moods. Animated emoticons can help identify your mood.       Relax Melodies Designed to help with sleep, on this app you can mix sounds and meditations for relaxation.      Smiling Mind Smiling Mind is meditation made easy: it's a simple tool that helps put a smile on your mind.        Stop, Breathe & Think  A friendly, simple guide for people through meditations for mindfulness and compassion.  Stop, Breathe and Think Kids Enter your current feelings and choose a "mission" to help you cope. Offers videos for certain moods instead of just sound recordings.       Team Orange The goal of this tool is to help teens change how they think, act, and react. This app helps you focus on your own good feelings and experiences.      The [employer] Box The [employer] Box (VHB) contains simple tools to help patients with coping, relaxation, distraction, and positive thinking.

## 2022-11-27 ENCOUNTER — Ambulatory Visit (INDEPENDENT_AMBULATORY_CARE_PROVIDER_SITE_OTHER): Payer: BC Managed Care – PPO | Admitting: Sports Medicine

## 2022-11-27 VITALS — BP 122/80 | HR 108 | Ht 61.0 in | Wt 212.0 lb

## 2022-11-27 DIAGNOSIS — M542 Cervicalgia: Secondary | ICD-10-CM

## 2022-11-27 DIAGNOSIS — M9901 Segmental and somatic dysfunction of cervical region: Secondary | ICD-10-CM | POA: Diagnosis not present

## 2022-11-27 DIAGNOSIS — M9902 Segmental and somatic dysfunction of thoracic region: Secondary | ICD-10-CM

## 2022-11-27 DIAGNOSIS — Z3A15 15 weeks gestation of pregnancy: Secondary | ICD-10-CM

## 2022-11-27 DIAGNOSIS — G8929 Other chronic pain: Secondary | ICD-10-CM

## 2022-11-27 DIAGNOSIS — M546 Pain in thoracic spine: Secondary | ICD-10-CM

## 2022-11-27 DIAGNOSIS — M9908 Segmental and somatic dysfunction of rib cage: Secondary | ICD-10-CM

## 2022-11-28 ENCOUNTER — Other Ambulatory Visit (HOSPITAL_COMMUNITY): Payer: Self-pay

## 2022-11-28 DIAGNOSIS — D563 Thalassemia minor: Secondary | ICD-10-CM | POA: Insufficient documentation

## 2022-11-28 HISTORY — DX: Thalassemia minor: D56.3

## 2022-12-04 ENCOUNTER — Other Ambulatory Visit (HOSPITAL_COMMUNITY): Payer: Self-pay

## 2022-12-08 ENCOUNTER — Other Ambulatory Visit (HOSPITAL_COMMUNITY): Payer: Self-pay

## 2022-12-08 ENCOUNTER — Ambulatory Visit (INDEPENDENT_AMBULATORY_CARE_PROVIDER_SITE_OTHER): Payer: BC Managed Care – PPO | Admitting: Obstetrics and Gynecology

## 2022-12-08 ENCOUNTER — Other Ambulatory Visit: Payer: Self-pay

## 2022-12-08 VITALS — BP 111/70 | HR 97 | Wt 211.0 lb

## 2022-12-08 DIAGNOSIS — Z3A17 17 weeks gestation of pregnancy: Secondary | ICD-10-CM

## 2022-12-08 DIAGNOSIS — R7309 Other abnormal glucose: Secondary | ICD-10-CM

## 2022-12-08 DIAGNOSIS — D563 Thalassemia minor: Secondary | ICD-10-CM

## 2022-12-08 DIAGNOSIS — B20 Human immunodeficiency virus [HIV] disease: Secondary | ICD-10-CM

## 2022-12-08 DIAGNOSIS — O0992 Supervision of high risk pregnancy, unspecified, second trimester: Secondary | ICD-10-CM

## 2022-12-08 DIAGNOSIS — G35 Multiple sclerosis: Secondary | ICD-10-CM

## 2022-12-08 DIAGNOSIS — O0990 Supervision of high risk pregnancy, unspecified, unspecified trimester: Secondary | ICD-10-CM

## 2022-12-08 NOTE — Progress Notes (Signed)
   PRENATAL VISIT NOTE  Subjective:  Candice [name] is a 37 y.o. G3P2002 at [redacted]w[redacted]d being seen today for ongoing prenatal care.  She is currently monitored for the following issues for this high-risk pregnancy and has OBESITY, NOS; Back pain; Genital herpes; Major depressive disorder, recurrent episode, moderate (HCC); HIV-1 (human immunodeficiency virus I) (HCC); Right leg numbness; Abnormal MRI, spine; MS (multiple sclerosis) (HCC); GERD (gastroesophageal reflux disease); Frequent urination; Depression, major, single episode, mild (HCC); Arthralgia; Vitamin D deficiency; Supervision of high risk pregnancy, antepartum; and Alpha thalassemia silent carrier on their problem list.  Patient reports no complaints.  Contractions: Not present.  .  Movement: Present. Denies leaking of fluid.   The following portions of the patient's history were reviewed and updated as appropriate: allergies, current medications, past family history, past medical history, past social history, past surgical history and problem list.   Objective:   Vitals:   12/08/22 1138  BP: 111/70  Pulse: 97  Weight: 211 lb (95.7 kg)    Fetal Status: Fetal Heart Rate (bpm): 145   Movement: Present     General:  Alert, oriented and cooperative. Patient is in no acute distress.  Skin: Skin is warm and dry. No rash noted.   Cardiovascular: Normal heart rate noted  Respiratory: Normal respiratory effort, no problems with respiration noted  Abdomen: Soft, gravid, appropriate for gestational age.  Pain/Pressure: Absent     Pelvic: Cervical exam deferred        Extremities: Normal range of motion.     Mental Status: Normal mood and affect. Normal behavior. Normal judgment and thought content.   Assessment and Plan:  Pregnancy: G3P2002 at [redacted]w[redacted]d 1. HIV-1 (human immunodeficiency virus I) (HCC) Follows with ID Levels are undetectable on last check.   2. Supervision of high risk pregnancy, antepartum Anatomy scheduled for  7/2 MSAFP done today.   3. Alpha thalassemia silent carrier Did not show for genetic appt but can be rescheduled if desired.    4. MS (multiple sclerosis) (HCC) NO meds. In remission. Has flares every 1-2 years typically.   5. Elevated hemoglobin A1c Early 1 hr was 109. Recheck 2 hr at 28w.   Preterm labor symptoms and general obstetric precautions including but not limited to vaginal bleeding, contractions, leaking of fluid and fetal movement were reviewed in detail with the patient. Please refer to After Visit Summary for other counseling recommendations.   No follow-ups on file.  Future Appointments  Date Time Provider Department Center  12/12/2022  9:45 AM Jennette Kettle, RPH-CPP [facility]  12/17/2022  3:45 PM [facility]BEHAVIORAL HEALTH CLINICIAN [facility]  12/23/2022 10:15 AM [facility]-MFC NURSE [facility]  12/23/2022 10:30 AM [facility] US3 [facility]  01/02/2023 11:00 AM Richardean Sale, DO [facility]-SM None  01/24/2023 10:50 AM GI-315 MR 1 GI-315MRI GI-315 W. WE  01/24/2023 11:30 AM GI-315 MR 1 GI-315MRI GI-315 W. WE  01/27/2023 11:30 AM GI-315 MR 1 GI-315MRI GI-315 W. WE  02/02/2023 10:10 AM Drema Dallas, DO LBN-LBNG None    Milas Hock, MD

## 2022-12-10 ENCOUNTER — Other Ambulatory Visit (HOSPITAL_COMMUNITY): Payer: Self-pay

## 2022-12-10 LAB — AFP, SERUM, OPEN SPINA BIFIDA
AFP MoM: 0.98
AFP Value: 34.7 ng/mL
Gest. Age on Collection Date: 17.2 weeks
Maternal Age At EDD: 39.3 yr
OSBR Risk 1 IN: 10000
Test Results:: NEGATIVE
Weight: 211 [lb_av]

## 2022-12-11 ENCOUNTER — Other Ambulatory Visit (HOSPITAL_COMMUNITY): Payer: Self-pay

## 2022-12-11 ENCOUNTER — Other Ambulatory Visit: Payer: Self-pay

## 2022-12-12 ENCOUNTER — Ambulatory Visit (INDEPENDENT_AMBULATORY_CARE_PROVIDER_SITE_OTHER): Payer: BC Managed Care – PPO | Admitting: Pharmacist

## 2022-12-12 ENCOUNTER — Other Ambulatory Visit (HOSPITAL_COMMUNITY): Payer: Self-pay

## 2022-12-12 ENCOUNTER — Other Ambulatory Visit: Payer: Self-pay

## 2022-12-12 ENCOUNTER — Telehealth: Payer: Self-pay

## 2022-12-12 DIAGNOSIS — Z21 Asymptomatic human immunodeficiency virus [HIV] infection status: Secondary | ICD-10-CM

## 2022-12-12 MED ORDER — CABOTEGRAVIR & RILPIVIRINE ER 400 & 600 MG/2ML IM SUER
1.0000 | Freq: Once | INTRAMUSCULAR | Status: AC
Start: 2022-12-12 — End: 2022-12-12
  Administered 2022-12-12: 1 via INTRAMUSCULAR

## 2022-12-12 NOTE — Telephone Encounter (Signed)
[name]  Patient's medication (Cabenuva 400-600mg ) have been couriered to [facility] from [employer] and will be administered on the patient next office visit on 12/12/22.  Clearance Coots , CPhT Specialty Pharmacy Patient [facility] for Infectious Disease Phone: [phone] Fax:  [phone]

## 2022-12-12 NOTE — Progress Notes (Signed)
HPI: Candice [name] is a 37 y.o. female who presents to the [facility] pharmacy clinic for Ages administration.  Patient Active Problem List   Diagnosis Date Noted   Alpha thalassemia silent carrier 11/28/2022   Supervision of high risk pregnancy, antepartum 11/10/2022   Vitamin D deficiency 06/20/2022   Arthralgia 02/18/2022   Depression, major, single episode, mild (HCC) 01/13/2022   Frequent urination 09/11/2021   MS (multiple sclerosis) (HCC) 05/21/2020   GERD (gastroesophageal reflux disease) 05/21/2020   Abnormal MRI, spine 04/06/2020   Right leg numbness 02/07/2020   HIV-1 (human immunodeficiency virus I) (HCC) 09/07/2019   Major depressive disorder, recurrent episode, moderate (HCC) 06/08/2013   Genital herpes 02/16/2013   Back pain 10/13/2011   OBESITY, NOS 08/20/2006    Patient's Medications  New Prescriptions   No medications on file  Previous Medications   ACETAMINOPHEN (TYLENOL) 500 MG TABLET    Take 500 mg by mouth every 6 (six) hours as needed for mild pain.   CABOTEGRAVIR & RILPIVIRINE ER (CABENUVA) 400 & 600 MG/2ML INJECTION    Inject 1 kit into the muscle every 30 (thirty) days.   PRENATAL VIT-FE FUMARATE-FA (PRENATAL MULTIVITAMIN) TABS TABLET    Take 1 tablet by mouth daily at 12 noon.   VALACYCLOVIR (VALTREX) 1000 MG TABLET    Take 1,000 mg by mouth 2 (two) times daily.  Modified Medications   No medications on file  Discontinued Medications   No medications on file    Allergies: Allergies  Allergen Reactions   Contrast Media [Iodinated Contrast Media] Nausea And Vomiting    Past Medical History: Past Medical History:  Diagnosis Date   Complication of anesthesia    epidural with last pregnancy made entire left side numb, had to d/c   Depression    Headache    HIV-1 (human immunodeficiency virus I) (HCC)    HSV 06/28/2007   Hx of migraines    Multiple sclerosis (HCC)     Social History: Social History   Socioeconomic History   Marital  status: Single    Spouse name: Not on file   Number of children: 2   Years of education: 14   Highest education level: Not on file  Occupational History    Comment: Associate Professor  Tobacco Use   Smoking status: Every Day    Packs/day: .25    Types: Cigarettes    Passive exposure: Current   Smokeless tobacco: Never   Tobacco comments:    Trying to quit due to pregnancy   Vaping Use   Vaping Use: Never used  Substance and Sexual Activity   Alcohol use: Not Currently    Comment: occasional   Drug use: Not Currently    Types: Marijuana    Comment: patient reports last THC use beginning of April   Sexual activity: Not Currently    Partners: Male    Birth control/protection: None    Comment: Decline condoms  Other Topics Concern   Not on file  Social History Narrative   Works as Associate Professor.   FOB of both children lives in [place]; will be some what involved but mother is no longer dating him.   Social Determinants of Health   Financial Resource Strain: Not on file  Food Insecurity: Not on file  Transportation Needs: Not on file  Physical Activity: Not on file  Stress: Not on file  Social Connections: Not on file    Labs: Lab Results  Component Value Date  HIV1RNAQUANT Not Detected 09/11/2022   HIV1RNAQUANT <20 (H) 06/20/2022   HIV1RNAQUANT Not Detected 02/14/2022   CD4TABS 684 09/11/2022   CD4TABS 1,026 11/22/2020   CD4TABS 1,078 03/29/2020    RPR and STI Lab Results  Component Value Date   LABRPR Non Reactive 09/17/2022   LABRPR NON-REACTIVE 12/13/2021   LABRPR NON-REACTIVE 06/20/2021   LABRPR Non Reactive 10/25/2020   LABRPR NON-REACTIVE 09/08/2019    STI Results GC CT  09/17/2022 10:00 AM Negative  Negative   08/29/2022  3:43 PM Negative  Negative   02/14/2022  9:13 AM Negative    Negative  Negative    Negative   12/13/2021  9:09 AM Negative    Negative  Negative    Negative   07/29/2021  4:28 PM Negative  Negative   06/20/2021  9:20 AM  Negative  Negative   12/21/2020  3:45 PM Negative  Negative   11/22/2020  9:58 AM Negative  Negative   10/25/2020  9:52 AM Negative  Negative   09/05/2019  4:31 PM Negative  Negative   10/16/2017 12:00 AM Negative  Negative   06/08/2017 12:00 AM Negative  Negative   10/14/2016 12:00 AM Negative  Negative   02/15/2016 12:00 AM Negative  Negative   07/13/2015 12:00 AM Negative  Negative   04/13/2015 12:00 AM Negative  Negative   09/24/2013 12:00 AM NG: Negative  CT: Negative   02/11/2011  3:36 PM  NEGATIVE   01/29/2010  7:00 PM  NEGATIVE   05/02/2009  8:17 PM  NEGATIVE     Hepatitis B Lab Results  Component Value Date   HEPBSAB REACTIVE (A) 09/08/2019   HEPBSAG Negative 09/17/2022   HEPBCAB NON-REACTIVE 09/08/2019   Hepatitis C Lab Results  Component Value Date   HEPCAB NON-REACTIVE 09/08/2019   Hepatitis A Lab Results  Component Value Date   HAV BORDERLINE (A) 09/08/2019   Lipids: Lab Results  Component Value Date   CHOL 183 11/22/2020   TRIG 80 11/22/2020   HDL 51 11/22/2020   CHOLHDL 3.6 11/22/2020   VLDL 10 02/14/2013   LDLCALC 115 (H) 11/22/2020    TARGET DATE:  The 25th of the month  Current HIV Regimen: Cabenuva bimonthly > Cabenuva monthly   Assessment: Candice [name] presents today for their maintenance Cabenuva injections. Initial/past injections were tolerated well without issues. No problems with systemic effects of injections. Patient is transitioning to monthly Cabenuva as she is now in her second trimester. Her last bimonthly injection was administered in early May (~two months ago), so will administer monthly dose today followed by monthly injections hereafter.    Administered cabotegravir 400 mg/38mL in left upper outer quadrant of the gluteal muscle. Administered rilpivirine 600 mg/75mL in the right upper outer quadrant of the gluteal muscle. Monitored patient for 10 minutes after injection. Injections were tolerated well without issue. Patient will follow  up in 1 month for next injection. Will check HIV RNA today and continue checking throughout the remainder of her pregnancy to ensure Cabenuva remains efficacious with changes to her volume of distribution. Candice [name] stated he would like to see her every 3 months through remainder of pregnancy.   Plan: - Cabenuva injections administered - Check HIV RNA - Next injections scheduled for 7/26 with me  - Call with any issues or questions  Margarite Gouge, PharmD, CPP, BCIDP, AAHIVP Clinical Pharmacist Practitioner Infectious Diseases Clinical Pharmacist [facility]

## 2022-12-14 LAB — HIV-1 RNA QUANT-NO REFLEX-BLD
HIV 1 RNA Quant: NOT DETECTED Copies/mL
HIV-1 RNA Quant, Log: NOT DETECTED Log cps/mL

## 2022-12-16 NOTE — BH Specialist Note (Unsigned)
Integrated Behavioral Health via Telemedicine Visit  12/17/2022 Candice [name] [id number]  Number of Integrated Behavioral Health Clinician visits: 2- Second Visit  Session Start time: 1547   Session End time: 1602  Total time in minutes: 15   Referring Provider: Scheryl Darter, MD Candice/Family location: Home [facility] Provider location: Center for [facility]  All persons participating in visit: Candice [name] and [facility] Sabria Florido   Types of Service: Individual psychotherapy and Video visit  I connected with Marvena L Lebon and/or Amairany L Streetman's  n/a  via  Telephone or Video Enabled Telemedicine Application  (Video is Caregility application) and verified that I am speaking with the correct person using two identifiers. Discussed confidentiality: Yes   I discussed the limitations of telemedicine and the availability of in person appointments.  Discussed there is a possibility of technology failure and discussed alternative modes of communication if that failure occurs.  I discussed that engaging in this telemedicine visit, they consent to the provision of behavioral healthcare and the services will be billed under their insurance.  Candice and/or legal guardian expressed understanding and consented to Telemedicine visit: Yes   Presenting Concerns: Candice and/or family reports the following symptoms/concerns: Anxiety has decreased after threatening stalker with a restraining order if he did not stop and making her employer aware of the situation.  Duration of problem: Current pregnancy; Severity of problem: mild  Candice and/or Family's Strengths/Protective Factors: Social connections, Concrete supports in place (healthy food, safe environments, etc.), and Sense of purpose  Goals Addressed: Candice will:  Reduce symptoms of: anxiety and stress   Increase knowledge and/or ability of: stress reduction   Demonstrate  ability to: Increase adequate support systems for Candice/family  Progress towards Goals: Ongoing  Interventions: Interventions utilized:  Link to [employer] and Supportive Reflection Standardized Assessments completed: Not Needed  Candice and/or Family Response: Candice agrees with treatment plan.   Assessment: Candice currently experiencing Adjustment disorder with anxious mood  Candice may benefit from continued therapeutic intervention today .  Plan: Follow up with behavioral health clinician on : Two months Behavioral recommendations:  -Continue prenatal vitamin daily -Resume relaxation breathing exercise as needed -Read through resources sent via MyChart message today; use as needed -Bring insurance card to next medical visit and update with front desk Referral(s): Integrated Art gallery manager (In Clinic) and Community Resources:  [facility]  I discussed the assessment and treatment plan with the Candice and/or parent/guardian. They were provided an opportunity to ask questions and all were answered. They agreed with the plan and demonstrated an understanding of the instructions.   They were advised to call back or seek an in-person evaluation if the symptoms worsen or if the condition fails to improve as anticipated.  [name], LCSW     11/10/2022    3:46 PM 10/14/2022    3:09 PM 09/17/2022    9:05 AM 09/11/2022    9:22 AM 09/10/2022   10:50 AM  Depression screen PHQ 2/9  Decreased Interest 1 1 0 0 1  Down, Depressed, Hopeless 0 0 0 0 0  PHQ - 2 Score 1 1 0 0 1  Altered sleeping 1 1 1  2   Tired, decreased energy 1 2 1  3   Change in appetite 1 1 1  2   Feeling bad or failure about yourself  1  0  0  Trouble concentrating 0 0 0  0  Moving slowly or fidgety/restless  0 0 0  0  Suicidal thoughts 0 0 0  0  PHQ-9 Score 5 5 3  8       11/10/2022    3:50 PM 06/10/2021    2:30 PM 04/28/2016   10:03 AM  GAD 7 : Generalized Anxiety  Score  Nervous, Anxious, on Edge 1 3 1   Control/stop worrying 2 3 1   Worry too much - different things 3 3 2   Trouble relaxing 2 3 2   Restless 0 2 1  Easily annoyed or irritable 1 3 2   Afraid - awful might happen 0 2 0  Total GAD 7 Score 9 19 9   Anxiety Difficulty  Very difficult Somewhat difficult

## 2022-12-17 ENCOUNTER — Ambulatory Visit: Payer: Self-pay | Admitting: Clinical

## 2022-12-17 DIAGNOSIS — F4322 Adjustment disorder with anxiety: Secondary | ICD-10-CM

## 2022-12-18 ENCOUNTER — Encounter: Payer: Self-pay | Admitting: *Deleted

## 2022-12-23 ENCOUNTER — Ambulatory Visit: Payer: BC Managed Care – PPO | Admitting: *Deleted

## 2022-12-23 ENCOUNTER — Other Ambulatory Visit: Payer: Self-pay | Admitting: *Deleted

## 2022-12-23 ENCOUNTER — Ambulatory Visit: Payer: BC Managed Care – PPO | Attending: Obstetrics & Gynecology

## 2022-12-23 VITALS — BP 119/63 | HR 89

## 2022-12-23 DIAGNOSIS — O99212 Obesity complicating pregnancy, second trimester: Secondary | ICD-10-CM

## 2022-12-23 DIAGNOSIS — Z362 Encounter for other antenatal screening follow-up: Secondary | ICD-10-CM

## 2022-12-23 DIAGNOSIS — O285 Abnormal chromosomal and genetic finding on antenatal screening of mother: Secondary | ICD-10-CM

## 2022-12-23 DIAGNOSIS — O0990 Supervision of high risk pregnancy, unspecified, unspecified trimester: Secondary | ICD-10-CM | POA: Diagnosis not present

## 2022-12-23 DIAGNOSIS — G35 Multiple sclerosis: Secondary | ICD-10-CM | POA: Diagnosis not present

## 2022-12-23 DIAGNOSIS — O98712 Human immunodeficiency virus [HIV] disease complicating pregnancy, second trimester: Secondary | ICD-10-CM

## 2022-12-23 DIAGNOSIS — O09522 Supervision of elderly multigravida, second trimester: Secondary | ICD-10-CM

## 2022-12-23 DIAGNOSIS — B20 Human immunodeficiency virus [HIV] disease: Secondary | ICD-10-CM

## 2022-12-23 DIAGNOSIS — O99352 Diseases of the nervous system complicating pregnancy, second trimester: Secondary | ICD-10-CM

## 2022-12-23 DIAGNOSIS — O09899 Supervision of other high risk pregnancies, unspecified trimester: Secondary | ICD-10-CM

## 2022-12-23 DIAGNOSIS — D259 Leiomyoma of uterus, unspecified: Secondary | ICD-10-CM

## 2022-12-23 DIAGNOSIS — E669 Obesity, unspecified: Secondary | ICD-10-CM

## 2022-12-23 DIAGNOSIS — Z3A19 19 weeks gestation of pregnancy: Secondary | ICD-10-CM

## 2022-12-23 DIAGNOSIS — O3412 Maternal care for benign tumor of corpus uteri, second trimester: Secondary | ICD-10-CM

## 2022-12-23 DIAGNOSIS — O09529 Supervision of elderly multigravida, unspecified trimester: Secondary | ICD-10-CM

## 2022-12-23 DIAGNOSIS — D563 Thalassemia minor: Secondary | ICD-10-CM

## 2023-01-01 ENCOUNTER — Other Ambulatory Visit (HOSPITAL_COMMUNITY): Payer: Self-pay

## 2023-01-01 NOTE — Progress Notes (Signed)
Aleen Sells D.[name] [address] [place] [address] Phone: [phone]   Assessment and Plan:     1. Neck pain 2. Chronic bilateral thoracic back pain 3. Chronic bilateral low back pain without sciatica 4. Somatic dysfunction of cervical region 5. Somatic dysfunction of thoracic region 6. Somatic dysfunction of lumbar region 7. Somatic dysfunction of pelvic region 8. Somatic dysfunction of rib region 9. [redacted] weeks gestation of pregnancy -Chronic with exacerbation, subsequent visit - Overall improvements in pain and decrease in left shoulder pain, however patient continues to have tightness in neck, upper back and now lower back - Patient has received relief with OMT in the past.  Elects for repeat OMT today.  Tolerated well per note below. - Decision today to treat with OMT was based on Physical Exam   After verbal consent patient was treated with HVLA (high velocity low amplitude), ME (muscle energy), FPR (flex positional release), ST (soft tissue), PC/PD (Pelvic Compression/ Pelvic Decompression) techniques in cervical, rib, thoracic, lumbar, and pelvic areas. Patient tolerated the procedure well with improvement in symptoms.  Patient educated on potential side effects of soreness and recommended to rest, hydrate, and use Tylenol as needed for pain control.   Pertinent previous records reviewed include none   Follow Up: 4 weeks for reevaluation.  Could consider repeat OMT   Subjective:   I, [name], am serving as a Neurosurgeon for Doctor Richardean Sale   Chief Complaint: thoracic back pain    HPI:    02/17/2022 Patient is a 37 year old female complaining of thoracic back pain. Patient states  Beginning around 2018, she began having intermittent episodes of numbness, weakness and dizziness.  In May or June of that year, she began having a recurrence of symptoms, including right flank pain and numbness radiating down her right leg with some  associated right leg weakness.  She felt off balance.  She saw neurology at that time.  MRI of thoracic spine revealed subtle enhancing hyperintense focus at the T8-T9 region.would like to discuss OMT    03/10/2022 Patient states that she is alright, left side back is still tight and shoulders    04/07/2022 Patient states that she is okay left side is still crazy even with PT   05/05/2022 Patient states she is good needs a tune up   06/02/2022 Patient states that she is pretty good    06/30/2022 Patient states left side is a problem , PT told her to re check rotator cuff   07/22/2022 Patient states she is pretty good, CSI was a life saver    09/09/2022 Patient states that she has been having intermittent flares    10/07/2022 Patient states that she would still like to get OMT while pregnant    11/04/2022 Patient states left side is  jacked up again    11/27/2022 Patient states shoulder neck and regular adjustment    01/02/2023 Patient states back is flared , but other than that pretty good      Relevant Historical Information: Multiple sclerosis  Additional pertinent review of systems negative.  Current Outpatient Medications  Medication Sig Dispense Refill   acetaminophen (TYLENOL) 500 MG tablet Take 500 mg by mouth every 6 (six) hours as needed for mild pain.     cabotegravir & rilpivirine ER (CABENUVA) 400 & 600 MG/2ML injection Inject 1 kit into the muscle every 30 (thirty) days. 4 mL 5   Prenatal Vit-Fe Fumarate-FA (PRENATAL MULTIVITAMIN) TABS tablet  Take 1 tablet by mouth daily at 12 noon.     valACYclovir (VALTREX) 1000 MG tablet Take 1,000 mg by mouth 2 (two) times daily.     Current Facility-Administered Medications  Medication Dose Route Frequency Provider Last Rate Last Admin   cabotegravir & rilpivirine ER (CABENUVA) 400 & 600 MG/2ML injection 1 kit  1 kit Intramuscular Once Jeanine Luz D, FNP          Objective:     Vitals:   01/02/23 1100  BP: 110/80   Pulse: 98  SpO2: 100%  Weight: 211 lb (95.7 kg)  Height: 5\' 1"  (1.549 m)      Body mass index is 39.87 kg/m.    Physical Exam:     General: Well-appearing, cooperative, sitting comfortably in no acute distress.   OMT Physical Exam:  ASIS Compression Test: Positive Right Cervical: TTP paraspinal, C3-5 RL SL, C1 RR Rib: Bilateral elevated first rib with TTP Thoracic: TTP paraspinal, T5 RRSR Lumbar: TTP paraspinal, L1-3 RLSR Pelvis: Right anterior innominate  Electronically signed by:  Aleen Sells D.[name] 11:31 AM 01/02/23

## 2023-01-02 ENCOUNTER — Ambulatory Visit (INDEPENDENT_AMBULATORY_CARE_PROVIDER_SITE_OTHER): Payer: BC Managed Care – PPO | Admitting: Sports Medicine

## 2023-01-02 VITALS — BP 110/80 | HR 98 | Ht 61.0 in | Wt 211.0 lb

## 2023-01-02 DIAGNOSIS — M9903 Segmental and somatic dysfunction of lumbar region: Secondary | ICD-10-CM

## 2023-01-02 DIAGNOSIS — Z3A20 20 weeks gestation of pregnancy: Secondary | ICD-10-CM

## 2023-01-02 DIAGNOSIS — M9905 Segmental and somatic dysfunction of pelvic region: Secondary | ICD-10-CM | POA: Diagnosis not present

## 2023-01-02 DIAGNOSIS — M542 Cervicalgia: Secondary | ICD-10-CM

## 2023-01-02 DIAGNOSIS — M9901 Segmental and somatic dysfunction of cervical region: Secondary | ICD-10-CM | POA: Diagnosis not present

## 2023-01-02 DIAGNOSIS — M545 Low back pain, unspecified: Secondary | ICD-10-CM

## 2023-01-02 DIAGNOSIS — M9908 Segmental and somatic dysfunction of rib cage: Secondary | ICD-10-CM

## 2023-01-02 DIAGNOSIS — M546 Pain in thoracic spine: Secondary | ICD-10-CM | POA: Diagnosis not present

## 2023-01-02 DIAGNOSIS — G8929 Other chronic pain: Secondary | ICD-10-CM

## 2023-01-02 DIAGNOSIS — M9902 Segmental and somatic dysfunction of thoracic region: Secondary | ICD-10-CM | POA: Diagnosis not present

## 2023-01-07 NOTE — Progress Notes (Signed)
   PRENATAL VISIT NOTE  Subjective:  Candice [name] is a 37 y.o. G3P2002 at [redacted]w[redacted]d being seen today for ongoing prenatal care.  She is currently monitored for the following issues for this high-risk pregnancy and has OBESITY, NOS; Genital herpes; Major depressive disorder, recurrent episode, moderate (HCC); HIV-1 (human immunodeficiency virus I) (HCC); MS (multiple sclerosis) (HCC); Depression, major, single episode, mild (HCC); Supervision of high risk pregnancy, antepartum; and Alpha thalassemia silent carrier on their problem list.  Patient reports no complaints.  Contractions: Not present. Vag. Bleeding: None.  Movement: Present. Denies leaking of fluid.   The following portions of the patient's history were reviewed and updated as appropriate: allergies, current medications, past family history, past medical history, past social history, past surgical history and problem list.   Objective:   Vitals:   01/08/23 1128  BP: 125/62  Pulse: 90  Weight: 218 lb 12.8 oz (99.2 kg)    Fetal Status: Fetal Heart Rate (bpm): 145 Fundal Height: 22 cm Movement: Present     General:  Alert, oriented and cooperative. Patient is in no acute distress.  Skin: Skin is warm and dry. No rash noted.   Cardiovascular: Normal heart rate noted  Respiratory: Normal respiratory effort, no problems with respiration noted  Abdomen: Soft, gravid, appropriate for gestational age.  Pain/Pressure: Absent     Pelvic: Cervical exam deferred        Extremities: Normal range of motion.     Mental Status: Normal mood and affect. Normal behavior. Normal judgment and thought content.   Assessment and Plan:  Pregnancy: G3P2002 at [redacted]w[redacted]d 1. Genital herpes simplex, unspecified site Suppression at 35-36w. Pt aware.   2. MS (multiple sclerosis) (HCC) NO meds. In remission. Has flares every 1-2 years typically.   3. Alpha thalassemia silent carrier FOB not in picture. Would do testing after birth.   4. HIV-1 (human  immunodeficiency virus I) (HCC) Follows with ID. Undetectable at 12/15/22.   5. Supervision of high risk pregnancy, antepartum AFP wnl Anatomy wnl. Has f/u in August.   6. Depression, major, single episode, mild (HCC) Follows with [facility]BH  Preterm labor symptoms and general obstetric precautions including but not limited to vaginal bleeding, contractions, leaking of fluid and fetal movement were reviewed in detail with the patient. Please refer to After Visit Summary for other counseling recommendations.   Return in about 4 weeks (around 02/05/2023) for 2 hr GTT/28w labs.  Future Appointments  Date Time Provider Department Center  01/16/2023  9:45 AM Jennette Kettle, RPH-CPP [facility]  01/24/2023 10:50 AM GI-315 MR 1 GI-315MRI GI-315 W. WE  01/24/2023 11:30 AM GI-315 MR 1 GI-315MRI GI-315 W. WE  01/26/2023  7:45 AM [facility] NURSE [facility]  01/26/2023  8:00 AM [facility]-MFC US1 [facility]  01/27/2023 11:30 AM GI-315 MR 1 GI-315MRI GI-315 W. WE  01/30/2023  9:45 AM Richardean Sale, DO [facility] None  02/02/2023 10:10 AM Drema Dallas, DO L[facility] None  02/18/2023 10:45 AM WMC-BEHAVIORAL HEALTH CLINICIAN [facility]  02/24/2023  9:15 AM [facility]-MFC NURSE [facility]  02/24/2023  9:30 AM [facility]-MFC US3 [facility]US [facility]    Milas Hock, MD

## 2023-01-08 ENCOUNTER — Ambulatory Visit (INDEPENDENT_AMBULATORY_CARE_PROVIDER_SITE_OTHER): Payer: BC Managed Care – PPO | Admitting: Obstetrics and Gynecology

## 2023-01-08 ENCOUNTER — Encounter: Payer: Self-pay | Admitting: Obstetrics and Gynecology

## 2023-01-08 ENCOUNTER — Other Ambulatory Visit: Payer: Self-pay

## 2023-01-08 VITALS — BP 125/62 | HR 90 | Wt 218.8 lb

## 2023-01-08 DIAGNOSIS — F320 Major depressive disorder, single episode, mild: Secondary | ICD-10-CM

## 2023-01-08 DIAGNOSIS — Z3A21 21 weeks gestation of pregnancy: Secondary | ICD-10-CM

## 2023-01-08 DIAGNOSIS — G35 Multiple sclerosis: Secondary | ICD-10-CM

## 2023-01-08 DIAGNOSIS — D563 Thalassemia minor: Secondary | ICD-10-CM

## 2023-01-08 DIAGNOSIS — A6000 Herpesviral infection of urogenital system, unspecified: Secondary | ICD-10-CM

## 2023-01-08 DIAGNOSIS — O0990 Supervision of high risk pregnancy, unspecified, unspecified trimester: Secondary | ICD-10-CM

## 2023-01-08 DIAGNOSIS — O0992 Supervision of high risk pregnancy, unspecified, second trimester: Secondary | ICD-10-CM

## 2023-01-08 DIAGNOSIS — B20 Human immunodeficiency virus [HIV] disease: Secondary | ICD-10-CM

## 2023-01-09 ENCOUNTER — Other Ambulatory Visit (HOSPITAL_COMMUNITY): Payer: Self-pay

## 2023-01-13 ENCOUNTER — Telehealth: Payer: Self-pay

## 2023-01-13 NOTE — Telephone Encounter (Signed)
[name]  Patient's medication (Cabenuva 400-600mg ) have been couriered to [facility] from [employer] and will be administered on the patient next office visit on 01/16/23.  Clearance Coots , CPhT Specialty Pharmacy Patient [facility] for Infectious Disease Phone: [phone] Fax:  [phone]

## 2023-01-16 ENCOUNTER — Ambulatory Visit (INDEPENDENT_AMBULATORY_CARE_PROVIDER_SITE_OTHER): Payer: BC Managed Care – PPO | Admitting: Pharmacist

## 2023-01-16 ENCOUNTER — Other Ambulatory Visit: Payer: Self-pay

## 2023-01-16 DIAGNOSIS — B20 Human immunodeficiency virus [HIV] disease: Secondary | ICD-10-CM | POA: Diagnosis not present

## 2023-01-16 DIAGNOSIS — Z21 Asymptomatic human immunodeficiency virus [HIV] infection status: Secondary | ICD-10-CM

## 2023-01-16 MED ORDER — CABOTEGRAVIR & RILPIVIRINE ER 400 & 600 MG/2ML IM SUER
1.0000 | Freq: Once | INTRAMUSCULAR | Status: AC
Start: 2023-01-16 — End: 2023-01-16
  Administered 2023-01-16: 1 via INTRAMUSCULAR

## 2023-01-16 NOTE — Progress Notes (Signed)
HPI: Candice [name] is a 37 y.o. female who presents to the [facility] pharmacy clinic for [place] administration.  Patient Active Problem List   Diagnosis Date Noted   Alpha thalassemia silent carrier 11/28/2022   Supervision of high risk pregnancy, antepartum 11/10/2022   Depression, major, single episode, mild (HCC) 01/13/2022   MS (multiple sclerosis) (HCC) 05/21/2020   HIV-1 (human immunodeficiency virus I) (HCC) 09/07/2019   Major depressive disorder, recurrent episode, moderate (HCC) 06/08/2013   Genital herpes 02/16/2013   OBESITY, NOS 08/20/2006    Patient's Medications  New Prescriptions   No medications on file  Previous Medications   ACETAMINOPHEN (TYLENOL) 500 MG TABLET    Take 500 mg by mouth every 6 (six) hours as needed for mild pain.   CABOTEGRAVIR & RILPIVIRINE ER (CABENUVA) 400 & 600 MG/2ML INJECTION    Inject 1 kit into the muscle every 30 (thirty) days.   PRENATAL VIT-FE FUMARATE-FA (PRENATAL MULTIVITAMIN) TABS TABLET    Take 1 tablet by mouth daily at 12 noon.   VALACYCLOVIR (VALTREX) 1000 MG TABLET    Take 1,000 mg by mouth 2 (two) times daily.  Modified Medications   No medications on file  Discontinued Medications   No medications on file    Allergies: Allergies  Allergen Reactions   Contrast Media [Iodinated Contrast Media] Nausea And Vomiting    Past Medical History: Past Medical History:  Diagnosis Date   Abnormal MRI, spine 04/06/2020   Arthralgia 02/18/2022   Back pain 10/13/2011   Complication of anesthesia    epidural with last pregnancy made entire left side numb, had to d/c   Depression    Frequent urination 09/11/2021   GERD (gastroesophageal reflux disease) 05/21/2020   Headache    HIV-1 (human immunodeficiency virus I) (HCC)    HSV 06/28/2007   Hx of migraines    Multiple sclerosis (HCC)    Right leg numbness 02/07/2020   Vitamin D deficiency 06/20/2022    Social History: Social History   Socioeconomic History   Marital  status: Single    Spouse name: Not on file   Number of children: 2   Years of education: 14   Highest education level: Not on file  Occupational History    Comment: Associate Professor  Tobacco Use   Smoking status: Every Day    Current packs/day: 0.25    Types: Cigarettes    Passive exposure: Current   Smokeless tobacco: Never   Tobacco comments:    Trying to quit due to pregnancy   Vaping Use   Vaping status: Never Used  Substance and Sexual Activity   Alcohol use: Not Currently    Comment: occasional   Drug use: Not Currently    Types: Marijuana    Comment: patient reports last THC use beginning of April   Sexual activity: Not Currently    Partners: Male    Birth control/protection: None    Comment: Decline condoms  Other Topics Concern   Not on file  Social History Narrative   Works as Associate Professor.   FOB of both children lives in [place]; will be some what involved but mother is no longer dating him.   Social Determinants of Health   Financial Resource Strain: Not on file  Food Insecurity: Not on file  Transportation Needs: Not on file  Physical Activity: Not on file  Stress: Not on file  Social Connections: Not on file    Labs: Lab Results  Component Value Date  HIV1RNAQUANT Not Detected 12/12/2022   HIV1RNAQUANT Not Detected 09/11/2022   HIV1RNAQUANT <20 (H) 06/20/2022   CD4TABS 684 09/11/2022   CD4TABS 1,026 11/22/2020   CD4TABS 1,078 03/29/2020    RPR and STI Lab Results  Component Value Date   LABRPR Non Reactive 09/17/2022   LABRPR NON-REACTIVE 12/13/2021   LABRPR NON-REACTIVE 06/20/2021   LABRPR Non Reactive 10/25/2020   LABRPR NON-REACTIVE 09/08/2019    STI Results GC CT  09/17/2022 10:00 AM Negative  Negative   08/29/2022  3:43 PM Negative  Negative   02/14/2022  9:13 AM Negative    Negative  Negative    Negative   12/13/2021  9:09 AM Negative    Negative  Negative    Negative   07/29/2021  4:28 PM Negative  Negative   06/20/2021   9:20 AM Negative  Negative   12/21/2020  3:45 PM Negative  Negative   11/22/2020  9:58 AM Negative  Negative   10/25/2020  9:52 AM Negative  Negative   09/05/2019  4:31 PM Negative  Negative   10/16/2017 12:00 AM Negative  Negative   06/08/2017 12:00 AM Negative  Negative   10/14/2016 12:00 AM Negative  Negative   02/15/2016 12:00 AM Negative  Negative   07/13/2015 12:00 AM Negative  Negative   04/13/2015 12:00 AM Negative  Negative   09/24/2013 12:00 AM NG: Negative  CT: Negative   02/11/2011  3:36 PM  NEGATIVE   01/29/2010  7:00 PM  NEGATIVE   05/02/2009  8:17 PM  NEGATIVE     Hepatitis B Lab Results  Component Value Date   HEPBSAB REACTIVE (A) 09/08/2019   HEPBSAG Negative 09/17/2022   HEPBCAB NON-REACTIVE 09/08/2019   Hepatitis C Lab Results  Component Value Date   HEPCAB NON-REACTIVE 09/08/2019   Hepatitis A Lab Results  Component Value Date   HAV BORDERLINE (A) 09/08/2019   Lipids: Lab Results  Component Value Date   CHOL 183 11/22/2020   TRIG 80 11/22/2020   HDL 51 11/22/2020   CHOLHDL 3.6 11/22/2020   VLDL 10 02/14/2013   LDLCALC 115 (H) 11/22/2020    TARGET DATE:  The 25th of the month  Current HIV Regimen: Cabenuva  Assessment: Jalesa presents today for their maintenance Cabenuva injections. Initial/past injections were tolerated well without issues. No problems with systemic effects of injections. Expected due date is on 05/16/2023 which is right around her [place] target date; scheduled her monthly injections out until then with the November injection as early in her window as possible. Will plan on transitioning to oral if needed temporarily around her delivery date. She will follow up with Tammy Sours in between monthly injections given scheduling conflicts.   Administered cabotegravir 400mg /49mL in left upper outer quadrant of the gluteal muscle. Administered rilpivirine 600 mg/17mL in the right upper outer quadrant of the gluteal muscle. Monitored patient  for 10 minutes after injection. Injections were tolerated well without issue. Patient will follow up in 1 month for next injection. Will check HIV RNA today.  Plan: - Cabenuva injections administered - Check HIV RNA - Next injections scheduled for 8/23, 9/20, and 10/18 with me - Follow up with Tammy Sours on 9/6 in between [place] injections  - Call with any issues or questions  Margarite Gouge, PharmD, CPP, BCIDP, AAHIVP Clinical Pharmacist Practitioner Infectious Diseases Clinical Pharmacist [facility]

## 2023-01-20 ENCOUNTER — Encounter: Payer: Self-pay | Admitting: Neurology

## 2023-01-21 DIAGNOSIS — O34219 Maternal care for unspecified type scar from previous cesarean delivery: Secondary | ICD-10-CM | POA: Insufficient documentation

## 2023-01-24 ENCOUNTER — Ambulatory Visit
Admission: RE | Admit: 2023-01-24 | Discharge: 2023-01-24 | Disposition: A | Discharge status: Home / Self Care | Payer: BC Managed Care – PPO | Source: Ambulatory Visit | Attending: Neurology | Admitting: Neurology

## 2023-01-24 DIAGNOSIS — G35 Multiple sclerosis: Secondary | ICD-10-CM

## 2023-01-26 ENCOUNTER — Ambulatory Visit (HOSPITAL_BASED_OUTPATIENT_CLINIC_OR_DEPARTMENT_OTHER): Payer: BC Managed Care – PPO | Admitting: *Deleted

## 2023-01-26 ENCOUNTER — Other Ambulatory Visit: Payer: Self-pay | Admitting: *Deleted

## 2023-01-26 ENCOUNTER — Ambulatory Visit: Payer: BC Managed Care – PPO

## 2023-01-26 ENCOUNTER — Ambulatory Visit: Payer: BC Managed Care – PPO | Attending: Maternal & Fetal Medicine

## 2023-01-26 VITALS — BP 118/66 | HR 97

## 2023-01-26 DIAGNOSIS — G35 Multiple sclerosis: Secondary | ICD-10-CM | POA: Diagnosis not present

## 2023-01-26 DIAGNOSIS — O09529 Supervision of elderly multigravida, unspecified trimester: Secondary | ICD-10-CM | POA: Diagnosis not present

## 2023-01-26 DIAGNOSIS — O09522 Supervision of elderly multigravida, second trimester: Secondary | ICD-10-CM | POA: Diagnosis not present

## 2023-01-26 DIAGNOSIS — D563 Thalassemia minor: Secondary | ICD-10-CM

## 2023-01-26 DIAGNOSIS — Z362 Encounter for other antenatal screening follow-up: Secondary | ICD-10-CM | POA: Insufficient documentation

## 2023-01-26 DIAGNOSIS — O99352 Diseases of the nervous system complicating pregnancy, second trimester: Secondary | ICD-10-CM

## 2023-01-26 DIAGNOSIS — Z3A24 24 weeks gestation of pregnancy: Secondary | ICD-10-CM

## 2023-01-26 DIAGNOSIS — D259 Leiomyoma of uterus, unspecified: Secondary | ICD-10-CM

## 2023-01-26 DIAGNOSIS — O0990 Supervision of high risk pregnancy, unspecified, unspecified trimester: Secondary | ICD-10-CM

## 2023-01-26 DIAGNOSIS — O98719 Human immunodeficiency virus [HIV] disease complicating pregnancy, unspecified trimester: Secondary | ICD-10-CM

## 2023-01-26 DIAGNOSIS — O34219 Maternal care for unspecified type scar from previous cesarean delivery: Secondary | ICD-10-CM | POA: Insufficient documentation

## 2023-01-26 DIAGNOSIS — O99012 Anemia complicating pregnancy, second trimester: Secondary | ICD-10-CM

## 2023-01-26 DIAGNOSIS — O98712 Human immunodeficiency virus [HIV] disease complicating pregnancy, second trimester: Secondary | ICD-10-CM | POA: Diagnosis not present

## 2023-01-26 DIAGNOSIS — O99212 Obesity complicating pregnancy, second trimester: Secondary | ICD-10-CM

## 2023-01-26 DIAGNOSIS — B20 Human immunodeficiency virus [HIV] disease: Secondary | ICD-10-CM | POA: Diagnosis not present

## 2023-01-26 DIAGNOSIS — O09899 Supervision of other high risk pregnancies, unspecified trimester: Secondary | ICD-10-CM | POA: Diagnosis not present

## 2023-01-26 DIAGNOSIS — O3412 Maternal care for benign tumor of corpus uteri, second trimester: Secondary | ICD-10-CM

## 2023-01-26 NOTE — Progress Notes (Addendum)
[facility] at [facility] [address] Phone:  [phone]   Fax:  [phone]    Name: Candice [name] Indication: Alpha thalassemia silent carrier, AMA  DOB: [birth year] Age: 37 y.o.   EDD: 05/16/2023 LMP: 08/09/2022 Referring Provider:  Adam Phenix, MD   EGA: [redacted]w[redacted]d  Genetic Counselor: Sheppard Plumber, MS, GC  OB Hx: [id number] Date of Appointment: 01/26/2023  Accompanied by: None Face to Face Time: 30 Minutes   Previous Testing Completed:  Candice [name] previously completed cell-free DNA screening (cfDNA) in this pregnancy. The result is low risk, consistent with a female fetus. This screening significantly reduces but does not eliminate the chance that the current pregnancy has Down syndrome (trisomy 3), trisomy 19, trisomy 19, and common sex chromosome conditions, and 22q11.2 microdeletion syndrome. Please see report for details. Additionally, there are many genetic conditions that cannot be detected by cfDNA.  Candice [name] previously completed carrier screening. Of the four conditions screened for, she was found to be a silent alpha thalassemia carrier. 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 For the other three conditions screened for, she was found to not be a carrier. Please see lab report for details. A negative result on carrier screening reduces but does not eliminate the chance of being a carrier.  Candice [name] previously completed a maternal serum AFP screen in this pregnancy. The result is screen negative. Please see report for details. A negative result reduces the risk that the current pregnancy has an open neural tube defect. Closed neural tube defects and some open defects may not be detected by this screen.   Pregnancy History:   This is Candice [name]'s third pregnancy. She has two living children.  Reports she takes prenatal vitamins, Cabenuva, and Valtrex. Personal history of multiple  sclerosis (in remission), HIV (undetectable), and possible pica. She reports craving cornstarch recently. Denies personal history of diabetes, high blood pressure, thyroid conditions, and seizures. Denies bleeding, infections, and fevers in this pregnancy. Denies using tobacco, alcohol, or street drugs in this pregnancy. We discussed that she should consider informing her OBGYN/PCP about her recent cravings, which may be associated with pica.    Family History: A three-generation pedigree was created and scanned into Epic under the Media tab.  Candice [name] has limited information for her family history and no family history for FOB. She has a sister (65 yo) with various mental health and physical health conditions, including schizophrenia and multiple sclerosis (MS). She has a sister who had at least three miscarriages early during pregnancy but has four healthy children. No known genetic testing performed on POC samples. She reports her father has various health concerns, including seizures and strokes. She reports her maternal grandmother (d. 13s) had various health concerns, including possible MS, cancer, and diabetes. Maternal ethnicity reported as Black and paternal ethnicity reported as Black. Denies Ashkenazi Jewish ancestry. Family history not remarkable for consanguinity, individuals with birth defects, intellectual disability, autism spectrum disorder, still births, or unexplained neonatal death. We discussed that mental health conditions and multiple sclerosis are typically multifactorial in nature. Therefore, these conditions are typically affected by genetics and environmental factors and are usually not due to a single underlying genetic cause. Without a known genetic cause to the mental health conditions, it is difficult to assess the risks for the fetus and other family members. It is estimated that there is a 2%-3% chance of a child of  a parent with MS to be affected as well. We also  discussed that recurrent pregnancy loss can occur for many reasons, including endocrine factors (i.e. uncontrolled diabetes mellitus, luteal phase deficiency, or polycystic ovarian syndrome), environmental agents, immunologic causes (i.e. antiphospholipid syndrome), maternal factors (i.e. uterine anatomic malformations, cervical abnormalities), as well as chromosomal and single-gene disorders. Without genetic testing for [name]'s sister or the POC samples, it is difficult to assess the causes of the miscarriages. However, an inherited genetic cause is less likely given she has four healthy children.     Genetic Counseling:   Maternal Silent Carrier for Alpha Thalassemia. Alpha thalassemia refers to a group of autosomal recessive blood disorders that reduce the amount of hemoglobin, the protein in red blood cells that carries oxygen to tissues throughout the body. Hemoglobin is made up of both alpha globin and beta globin proteins. Alpha thalassemia is different in its inheritance compared to other hemoglobinopathies as there are two alpha-globin genes on each chromosome 16 (??/??). Alpha thalassemia occurs when three or more of the four alpha-globin genes are deleted. Candice [name] is a silent carrier for alpha thalassemia (??/-?) caused by the pathogenic alpha 3.7 deletion of the HBA2 gene. With this result, we know that Candice [name] has three functional copies of the alpha-globin genes while her 4th alpha-globin gene is deleted. Each of Candice [name]'s children will either inherit two functional genes (??) or one functional gene and one deletion (-?) from her.  Candice [name] is not at increased risk to have a baby with fetal hydrops due to Hemoglobin Barts disease (four deleted alpha-globin genes: --/--) regardless of her reproductive partner's carrier status. Having four deleted alpha-globin genes results in severe anemia. Affected babies develop symptoms before birth and without treatment typically do  not survive the newborn period. If Candice [name]'s reproductive partner is found to be an alpha thalassemia carrier in the cis configuration (two deleted alpha-globin genes on the same chromosome: ??/--) there would be a 25% risk for the current pregnancy to be affected with Hemoglobin H Disease (three deleted alpha-globin genes: --/-?). Clinical features of this condition are highly variable and generally develop in the first years of life. People with severe symptoms may have chronic anemia, liver disease, and bone changes. Some affected individuals do not require blood transfusions while others may require occasional blood transfusions throughout their lifetime. Given Ms. Edith's carrier screening result, carrier screening for her reproductive partner was offered to determine risk for the current pregnancy, but he is unavailable for testing. If FOB is found to be a carrier for alpha thalassemia, given his Black ancestry, it is more likely for him to be a silent carrier (??/-?) or a carrier in the trans configuration (two deleted or changed alpha-globin genes on opposite chromosomes: -?/-?), as the cis configuration (??/--) has been reported very rarely in individuals with Black ancestry. If both members of a couple are known to be carriers, diagnostic testing through amniocentesis is available to determine if the pregnancy is affected. This can be seen under testing/screening options below. Ms. Everlene declined amniocentesis.  Advanced maternal age. We discussed that pregnant patients are considered to be of advanced maternal age (AMA) when they are 5 years of age or older at the time of delivery. Some of the complications associated with being AMA are increased risk of miscarriage, increased risk for the fetus to be affected with a chromosomal difference, and certain pregnancy complications such as high blood pressure and gestational diabetes. Common chromosomal differences  associated with AMA include  trisomy 2 (Down syndrome), trisomy 78, and trisomy 28. The increased risk of chromosomal differences in the fetus is due to nondisjunction of certain chromosomes during the formation of the egg cell, resulting in extra or missing chromosomes in the fetus. For Mikahla, prior to any aneuploidy screening performed, the chance the fetus would be born with a chromosome difference is 1 in 58 based on her age. This means that there is a 80 in 81 chance the fetus would not be affected with a chromosome difference. Due to her low risk cfDNA screening results, the chance that the fetus would be affected is lower than the prior 1 in 81 risk. We also discussed amniocentesis for diagnostic testing that can be offered to pregnant patients to inform the chance for a chromosomal difference in the fetus. This can be seen under testing/screening options below.  Birth Defects. All babies have approximately a 3-5% risk for a birth defect and a majority of these defects cannot be detected through the screening or diagnostic testing listed. Ultrasound may detect some birth defects, but it may not detect all birth defects. About half of pregnancies with Down syndrome do not show any soft markers on ultrasound. A normal ultrasound does not guarantee a healthy pregnancy.   Testing/Screening Options:   Amniocentesis for Prenatal Diagnosis. This procedure involves the removal of a sample of the amniotic fluid surrounding the fetus with ultrasound-guided use of a thin needle inserted through the maternal abdomen and uterus. This procedure is generally performed after the 15th week of pregnancy. Depending on the indication, the sample can be used for genetic tests including karyotype, chromosomal microarray, FISH analysis, single gene and multi-gene panels, and exome/genome sequencing as well as testing for open neural tube defects. Fetal cells from amniotic fluid are directly evaluated and > 99.5% of chromosome problems and > 98% of  open neural tube defects can be detected. Possible procedural difficulties and complications that can arise include maternal fever/infection, cramping, bleeding, fluid leakage, and/or pregnancy loss. The risk for preterm delivery with amniocentesis is 1/500.  Newborn Screening. The [place] Newborn Screening (NBS) program will screen all newborn babies for cystic fibrosis, spinal muscular atrophy, hemoglobinopathies, and numerous other conditions.    Patient Plan:  Proceed with: Routine prenatal care. Informed consent was obtained. All questions were answered.  Declined: Amniocentesis.   Thank you for sharing in the care of Ms. Chevelle with [place].  Please do not hesitate to contact us at [phone] if you have any questions.  Sheppard Plumber, MS, GC Genetic Counselor  Genetic counseling student involved in appointment: No.

## 2023-01-27 ENCOUNTER — Other Ambulatory Visit: Payer: BC Managed Care – PPO

## 2023-01-29 NOTE — Progress Notes (Signed)
Candice [name] D.[name] [address] [place] [address] Phone: [phone]   Assessment and Plan:     1. Neck pain 2. Chronic bilateral thoracic back pain 3. Chronic bilateral low back pain without sciatica 4. Somatic dysfunction of cervical region 5. Somatic dysfunction of thoracic region 6. Somatic dysfunction of lumbar region 7. Somatic dysfunction of pelvic region 8. Somatic dysfunction of rib region 9. [redacted] weeks gestation of pregnancy  -Chronic with exacerbation, subsequent visit - Overall improvements in pain with regular OMT and HEP, however flare of primarily left upper back pain and left lower back pain since previous office visit - Patient asked if she could use Flexeril for muscle spasms and pain relief.  We discussed that Flexeril is in pregnancy category B, so I do not recommend its use during pregnancy - Continue Tylenol for day-to-day pain relief - Patient has received relief with OMT in the past.  Elects for repeat OMT today.  Tolerated well per note below. - Decision today to treat with OMT was based on Physical Exam   After verbal consent patient was treated with HVLA (high velocity low amplitude), ME (muscle energy), FPR (flex positional release), ST (soft tissue), PC/PD (Pelvic Compression/ Pelvic Decompression) techniques in cervical, rib, thoracic, lumbar, and pelvic areas. Patient tolerated the procedure well with improvement in symptoms.  Patient educated on potential side effects of soreness and recommended to rest, hydrate, and use Tylenol as needed for pain control.   Pertinent previous records reviewed include none   Follow Up: 4 weeks or sooner for reevaluation.  Could consider repeat OMT.  Patient will be entering her third trimester and may require more frequent visits   Subjective:   I, Candice [name], am serving as a Neurosurgeon for Doctor Richardean Sale   Chief Complaint: thoracic back pain    HPI:     02/17/2022 Patient is a 37 year old female complaining of thoracic back pain. Patient states  Beginning around 2018, she began having intermittent episodes of numbness, weakness and dizziness.  In May or June of that year, she began having a recurrence of symptoms, including right flank pain and numbness radiating down her right leg with some associated right leg weakness.  She felt off balance.  She saw neurology at that time.  MRI of thoracic spine revealed subtle enhancing hyperintense focus at the T8-T9 region.would like to discuss OMT    03/10/2022 Patient states that she is alright, left side back is still tight and shoulders    04/07/2022 Patient states that she is okay left side is still crazy even with PT   05/05/2022 Patient states she is good needs a tune up   06/02/2022 Patient states that she is pretty good    06/30/2022 Patient states left side is a problem , PT told her to re check rotator cuff   07/22/2022 Patient states she is pretty good, CSI was a life saver    09/09/2022 Patient states that she has been having intermittent flares    10/07/2022 Patient states that she would still like to get OMT while pregnant    11/04/2022 Patient states left side is  jacked up again    11/27/2022 Patient states shoulder neck and regular adjustment    01/02/2023 Patient states back is flared , but other than that pretty good    01/30/2023 Patient states left side is starting to act up and spasm     Relevant Historical Information: Multiple sclerosis  Additional pertinent review of systems negative.  Current Outpatient Medications  Medication Sig Dispense Refill   acetaminophen (TYLENOL) 500 MG tablet Take 500 mg by mouth every 6 (six) hours as needed for mild pain.     cabotegravir & rilpivirine ER (CABENUVA) 400 & 600 MG/2ML injection Inject 1 kit into the muscle every 30 (thirty) days. 4 mL 5   Prenatal Vit-Fe Fumarate-FA (PRENATAL MULTIVITAMIN) TABS tablet Take 1 tablet by  mouth daily at 12 noon.     valACYclovir (VALTREX) 1000 MG tablet Take 1,000 mg by mouth 2 (two) times daily.     Current Facility-Administered Medications  Medication Dose Route Frequency Provider Last Rate Last Admin   cabotegravir & rilpivirine ER (CABENUVA) 400 & 600 MG/2ML injection 1 kit  1 kit Intramuscular Once Jeanine Luz D, FNP          Objective:     Vitals:   01/30/23 0928  BP: 132/82  Pulse: (!) 101  SpO2: 98%  Weight: 225 lb (102.1 kg)  Height: 5\' 1"  (1.549 m)      Body mass index is 42.51 kg/m.    Physical Exam:     General: Well-appearing, cooperative, sitting comfortably in no acute distress.   OMT Physical Exam:  ASIS Compression Test: Positive left Cervical: TTP paraspinal, C3 RRSL, C6 RLSR Rib: Bilateral elevated first rib with TTP, worse on left Thoracic: TTP paraspinal, T4-6 RL SR Lumbar: TTP paraspinal, L2 RR SR Pelvis: Left anterior innominate  Electronically signed by:  Candice [name] D.[name] 9:50 AM 01/30/23

## 2023-01-30 ENCOUNTER — Ambulatory Visit: Payer: BC Managed Care – PPO | Admitting: Sports Medicine

## 2023-01-30 VITALS — BP 132/82 | HR 101 | Ht 61.0 in | Wt 225.0 lb

## 2023-01-30 DIAGNOSIS — M9902 Segmental and somatic dysfunction of thoracic region: Secondary | ICD-10-CM

## 2023-01-30 DIAGNOSIS — M545 Low back pain, unspecified: Secondary | ICD-10-CM

## 2023-01-30 DIAGNOSIS — M9901 Segmental and somatic dysfunction of cervical region: Secondary | ICD-10-CM

## 2023-01-30 DIAGNOSIS — G8929 Other chronic pain: Secondary | ICD-10-CM

## 2023-01-30 DIAGNOSIS — M546 Pain in thoracic spine: Secondary | ICD-10-CM

## 2023-01-30 DIAGNOSIS — Z3A24 24 weeks gestation of pregnancy: Secondary | ICD-10-CM

## 2023-01-30 DIAGNOSIS — M542 Cervicalgia: Secondary | ICD-10-CM

## 2023-01-30 DIAGNOSIS — M9905 Segmental and somatic dysfunction of pelvic region: Secondary | ICD-10-CM

## 2023-01-30 DIAGNOSIS — M9908 Segmental and somatic dysfunction of rib cage: Secondary | ICD-10-CM

## 2023-01-30 DIAGNOSIS — M9903 Segmental and somatic dysfunction of lumbar region: Secondary | ICD-10-CM

## 2023-02-01 NOTE — Progress Notes (Unsigned)
NEUROLOGY FOLLOW UP OFFICE NOTE  Candice [name] [id number]  Assessment/Plan:   Relapsing-remitting multiple sclerosis Asymptomatic HIV, well-controlled Pregnant at [redacted] weeks gestation   Although her HIV is well-controlled, it would still be best to avoid anti-CD20 monoclonal antibodies.   While pregnant, defer DMT.  Pregnancy may be protective from flare ups. We will repeat MRI of brain/cervical/thoracic spine with and without contrast at end of December Continue D3 5000 IU daily.  Check vit D level today. Follow up about one week after repeat MRIs.       Subjective:  Candice [name] is a 37 year old right-handed female at [redacted] weeks gestation with HIV and migraines who follows up regarding multiple sclerosis.     UPDATE: Current DMT:  none Current medications:  Cabenuva (HIV), acetaminophen, D3 5000 IU daily  She did not have repeat MRIs because she found out she is pregnant.  Due date in November 23.    Overall doing well Vision:  No issues Motor:  No weakness Sensory:  none.   Pain:  As per motor, some back pain and left sided arm muscle spasms.  Underwent PT and OMT which helped with back pain.  She saw Sports Medicine and found to have rotator cuff problem.   Gait:  okay Bowel/Bladder:  constipation.   Fatigue:  Improved. Cognition: Improved Mood:  good. Intermittent left-sided head pressure along w             ith intermittent blurred vision. Sleep:  Poor.  Trouble staying asleep.  Goes to bed at 9-10 PM.  Wakes up at 3 AM and cannot fall back asleep.     HISTORY: Beginning around 2018, she began having intermittent episodes of numbness, weakness and dizziness.  She was diagnosed with HIV in March 2021 and started on [place].  In May or June of that year, she began having a recurrence of symptoms, including right flank pain and numbness radiating down her right leg with some associated right leg weakness.  She felt off balance.  She saw neurology at that time.   MRI of thoracic spine revealed subtle enhancing hyperintense focus at the T8-T9 region.  Follow up MRI of brain revealed multiple scattered patchy nonenhancing hyperintense lesions within the cerebral white matter.She underwent lumbar puncture in November 2021 which revealed cell count 0, protein 23, glucose 59, negative culture, negative VDRL but >5 oligoclonal bands not in corresponding serum.  Formally diagnosed with MS.  Neurology suspected that as viral load became controlled with antiviral therapy with increased CD4 counts, she had an exacerbation.  As case reports found that treatment for HIV may provide a protective benefit on MS progression, neurology considered monitoring vs changing antiviral therpay to Atripla vs starting a low-dose interferon beta-1a. She missed her follow up appointment with neurology.  Symptoms improved once HIV became controlled.  Since early 2023, notices gradual progression of symptoms   Currently taking Cabenuva.  Viral load undetectable.  CD4 count 898.    Past DMT:  A[place]agio (headaches, rash)  Past medications:  Robaxin, sertraline, tramadol, Ambien, Flexeril (helpful)   Imaging: 01/21/2022 MRI BRAIN W WO:  Largely stable T2 and FLAIR signal affecting the cerebral hemispheric white matter with minimal involvement of the middle cerebellar peduncle on the left consistent with the clinical diagnosis of multiple sclerosis. There are a few small foci of newly seen signal in the hemispheric white matter as marked by arrows, representing a minimal change. None of these show restricted diffusion or  contrast enhancement. 01/18/2022 MRI C-SPINE W WO:  1. Stable and normal MRI appearance of the cervical spinal cord with no evidence for demyelinating disease or abnormal enhancement.  2. Mild cervical spondylosis at C5-6 with resultant mild to moderate bilateral C6 foraminal stenosis. 3. Additional mild spondylosis elsewhere within the cervical spine without significant stenosis  or impingement. 01/18/2022 MRI T-SPINE W WO:  1. Stable demyelinating lesion involving the right dorsolateral cord at T8-9. No associated enhancement to suggest active demyelination.  No new lesions to suggest interval disease progression. 2. Otherwise normal MRI of the thoracic spine. 04/30/2020 MRI C-SPINE W WO:  Unremarkable MRI cervical spine (with and without).  No intrinsic, compressive or abnormal enhancing spinal cord lesions. 04/22/2020 MRI BRAIN W WO:  Multiple scattered patchy T2/FLAIR hyperintensities involving the supratentorial cerebral white matter, nonspecific, but most suspicious for possible demyelinating disease/multiple sclerosis. No evidence for active demyelination. 02/25/2020 MRI T-SPINE W WO:  1.   There is a T2 hyperintense focus with subtle enhancement adjacent to T8-T9 in the right posterolateral spinal cord. Though nonspecific, this is most likely to represent a focus of demyelination or inflammation.  Consider MRI of the brain to determine if there is evidence of demyelination elsewhere.  2.   No spinal degenerative changes. 02/11/2020 MRI L-SPINE W WO:  Central disc protrusion at L5-S1, contacting the thecal sac and S1 root sleeves as they bud from the thecal sac. Nerve compression is not demonstrated. This abnormality could result in nerve irritation.  Mild facet osteoarthritis at L4-5 and L5-S1 that could contribute to low back pain.   JCV ab positive with index of 1.59; Quantiferon-TB Gold Plus negative   Family history:  Sister has MS.  Maternal grandmother may have had it (based on symptoms but patient unsure if she was evaluated or diagnosed)  PAST MEDICAL HISTORY: Past Medical History:  Diagnosis Date   Abnormal MRI, spine 04/06/2020   Arthralgia 02/18/2022   Back pain 10/13/2011   Complication of anesthesia    epidural with last pregnancy made entire left side numb, had to d/c   Depression    Frequent urination 09/11/2021   GERD (gastroesophageal reflux  disease) 05/21/2020   Headache    HIV-1 (human immunodeficiency virus I) (HCC)    HSV 06/28/2007   Hx of migraines    Multiple sclerosis (HCC)    Right leg numbness 02/07/2020   Vitamin D deficiency 06/20/2022    MEDICATIONS: Current Outpatient Medications on File Prior to Visit  Medication Sig Dispense Refill   acetaminophen (TYLENOL) 500 MG tablet Take 500 mg by mouth every 6 (six) hours as needed for mild pain.     cabotegravir & rilpivirine ER (CABENUVA) 400 & 600 MG/2ML injection Inject 1 kit into the muscle every 30 (thirty) days. 4 mL 5   Prenatal Vit-Fe Fumarate-FA (PRENATAL MULTIVITAMIN) TABS tablet Take 1 tablet by mouth daily at 12 noon.     valACYclovir (VALTREX) 1000 MG tablet Take 1,000 mg by mouth 2 (two) times daily.     Current Facility-Administered Medications on File Prior to Visit  Medication Dose Route Frequency Provider Last Rate Last Admin   cabotegravir & rilpivirine ER (CABENUVA) 400 & 600 MG/2ML injection 1 kit  1 kit Intramuscular Once Veryl Speak, FNP        ALLERGIES: Allergies  Allergen Reactions   Contrast Media [Iodinated Contrast Media] Nausea And Vomiting    FAMILY HISTORY: Family History  Problem Relation Age of Onset   Alcohol abuse Mother  Drug abuse Mother    Depression Mother    Alcohol abuse Father    Drug abuse Father    Multiple sclerosis Sister    Mental illness Sister        bipolar ptsd   Schizophrenia Sister    Diabetes Maternal Grandmother    Hypertension Maternal Grandmother    Cancer Maternal Grandmother        ovarian, breast and lung   Depression Maternal Grandmother       Objective:  Blood pressure 112/76, pulse 80, height 5\' 2"  (1.575 m), weight 220 lb (99.8 kg), last menstrual period 08/09/2022, SpO2 98%. General: No acute distress.  Patient appears well-groomed.   Head:  Normocephalic/atraumatic Eyes:  Fundi examined but not visualized Neck: supple, no paraspinal tenderness, full range of  motion Heart:  Regular rate and rhythm Neurological Exam: alert and oriented.  Speech fluent and not dysarthric, language intact.  CN II-XII intact. Bulk and tone normal, muscle strength 5/5 throughout.  Sensation to pinprick and vibration intact.  Deep tendon reflexes 2+ throughout.  Finger to nose testing intact.  Gait normal, Romberg negative.   Shon Millet, DO  CC: Erick Alley, DO

## 2023-02-02 ENCOUNTER — Ambulatory Visit: Payer: Self-pay

## 2023-02-02 ENCOUNTER — Ambulatory Visit (INDEPENDENT_AMBULATORY_CARE_PROVIDER_SITE_OTHER): Payer: BC Managed Care – PPO | Admitting: Neurology

## 2023-02-02 ENCOUNTER — Other Ambulatory Visit (INDEPENDENT_AMBULATORY_CARE_PROVIDER_SITE_OTHER): Payer: BC Managed Care – PPO

## 2023-02-02 ENCOUNTER — Encounter: Payer: Self-pay | Admitting: Neurology

## 2023-02-02 VITALS — BP 112/76 | HR 80 | Ht 62.0 in | Wt 220.0 lb

## 2023-02-02 DIAGNOSIS — Z21 Asymptomatic human immunodeficiency virus [HIV] infection status: Secondary | ICD-10-CM | POA: Diagnosis not present

## 2023-02-02 DIAGNOSIS — Z3A25 25 weeks gestation of pregnancy: Secondary | ICD-10-CM

## 2023-02-02 DIAGNOSIS — G35 Multiple sclerosis: Secondary | ICD-10-CM

## 2023-02-02 LAB — VITAMIN D 25 HYDROXY (VIT D DEFICIENCY, FRACTURES): VITD: 26.97 ng/mL — ABNORMAL LOW (ref 30.00–100.00)

## 2023-02-02 NOTE — Patient Instructions (Addendum)
Reschedule MRI of brain, cervical and thoracic spine with and without contrast in end of December.  Follow up one week afterwards.

## 2023-02-04 ENCOUNTER — Encounter: Payer: Self-pay | Admitting: Sports Medicine

## 2023-02-04 NOTE — BH Specialist Note (Deleted)
Integrated Behavioral Health via Telemedicine Visit  02/04/2023 Candice [name] [id number]  Number of Integrated Behavioral Health Clinician visits: 2- Second Visit  Session Start time: 1547   Session End time: 1602  Total time in minutes: 15   Referring Provider: *** Patient/Family location: [facility] Provider location: *** All persons participating in visit: *** Types of Service: {CHL AMB TYPE OF SERVICE:[phone]}  I connected with Candice [name] and/or Candice [name]'s {family members:20773} via  Telephone or Video Enabled Telemedicine Application  (Video is Caregility application) and verified that I am speaking with the correct person using two identifiers. Discussed confidentiality: {YES/NO:21197}  I discussed the limitations of telemedicine and the availability of in person appointments.  Discussed there is a possibility of technology failure and discussed alternative modes of communication if that failure occurs.  I discussed that engaging in this telemedicine visit, they consent to the provision of behavioral healthcare and the services will be billed under their insurance.  Patient and/or legal guardian expressed understanding and consented to Telemedicine visit: {YES/NO:21197}  Presenting Concerns: Patient and/or family reports the following symptoms/concerns: *** Duration of problem: ***; Severity of problem: {Mild/Moderate/Severe:20260}  Patient and/or Family's Strengths/Protective Factors: {CHL AMB BH PROTECTIVE FACTORS:[phone]}  Goals Addressed: Patient will:  Reduce symptoms of: {IBH Symptoms:21014056}   Increase knowledge and/or ability of: {IBH Patient Tools:21014057}   Demonstrate ability to: {IBH Goals:21014053}  Progress towards Goals: {CHL AMB BH PROGRESS TOWARDS GOALS:8258620023}  Interventions: Interventions utilized:  {IBH Interventions:21014054} Standardized Assessments completed: {IBH Screening Tools:21014051}  Patient and/or  Family Response: ***  Assessment: Patient currently experiencing ***.   Patient may benefit from ***.  Plan: Follow up with behavioral health clinician on : *** Behavioral recommendations: *** Referral(s): {IBH Referrals:21014055}  I discussed the assessment and treatment plan with the patient and/or parent/guardian. They were provided an opportunity to ask questions and all were answered. They agreed with the plan and demonstrated an understanding of the instructions.   They were advised to call back or seek an in-person evaluation if the symptoms worsen or if the condition fails to improve as anticipated.  Valetta Close Deneice Wack, LCSW

## 2023-02-05 ENCOUNTER — Other Ambulatory Visit: Payer: Self-pay

## 2023-02-05 DIAGNOSIS — O0990 Supervision of high risk pregnancy, unspecified, unspecified trimester: Secondary | ICD-10-CM

## 2023-02-05 NOTE — Progress Notes (Signed)
Candice [name] D.[name] [address] [place] [address] Phone: [phone]   Assessment and Plan:     1. Neck pain 2. Chronic left shoulder pain 3. [redacted] weeks gestation of pregnancy  -Chronic with exacerbation, subsequent sports medicine visit - Recurrent flares of muscular pain through left side of neck, left trapezius, left shoulder, left rhomboid.  Patient had significant relief in the past with trigger point injections.  We discussed that ideally we would not include corticosteroid and repeat injections due to patient's 26-week gestation.  Patient in agreement to perform lidocaine only trigger point injections at today's visit.  Tolerated well per note below - Continue HEP and may use Tylenol for day-to-day pain relief  Trigger Point Injection: After informed consent was obtained, skin cleaned with alcohol  prep.  A total of 7 trigger points identified along left cervical paraspinal, left trapezius, left rhomboid, left levator.  Injections given over area of pain for total injection of 6 ml lidocaine 1% w/o epi.  Patient had relief after the injection without side effects.  Pt given signs of infection to watch for.   Pertinent previous records reviewed include none   Follow Up: 2 to 3 weeks for reevaluation.  Could consider repeat OMT   Subjective:   I, Candice [name], am serving as a Neurosurgeon for Doctor Richardean Sale   Chief Complaint: thoracic back pain    HPI:    02/17/2022 Patient is a 37 year old female complaining of thoracic back pain. Patient states  Beginning around 2018, she began having intermittent episodes of numbness, weakness and dizziness.  In May or June of that year, she began having a recurrence of symptoms, including right flank pain and numbness radiating down her right leg with some associated right leg weakness.  She felt off balance.  She saw neurology at that time.  MRI of thoracic spine revealed subtle  enhancing hyperintense focus at the T8-T9 region.would like to discuss OMT    03/10/2022 Patient states that she is alright, left side back is still tight and shoulders    04/07/2022 Patient states that she is okay left side is still crazy even with PT   05/05/2022 Patient states she is good needs a tune up   06/02/2022 Patient states that she is pretty good    06/30/2022 Patient states left side is a problem , PT told her to re check rotator cuff   07/22/2022 Patient states she is pretty good, CSI was a life saver    09/09/2022 Patient states that she has been having intermittent flares    10/07/2022 Patient states that she would still like to get OMT while pregnant    11/04/2022 Patient states left side is  jacked up again    11/27/2022 Patient states shoulder neck and regular adjustment    01/02/2023 Patient states back is flared , but other than that pretty good    01/30/2023 Patient states left side is starting to act up and spasm    02/09/2023 Patient states that her shoulder pain has started coming back in the last 2-3 weeks and it is burning.   Relevant Historical Information: Multiple sclerosis  Additional pertinent review of systems negative.   Current Outpatient Medications:    acetaminophen (TYLENOL) 500 MG tablet, Take 500 mg by mouth every 6 (six) hours as needed for mild pain., Disp: , Rfl:    cabotegravir & rilpivirine ER (CABENUVA) 400 & 600 MG/2ML injection, Inject 1  kit into the muscle every 30 (thirty) days., Disp: 4 mL, Rfl: 5   Prenatal Vit-Fe Fumarate-FA (M-NATAL PLUS) 27-1 MG TABS, Take 1 tablet by mouth daily., Disp: 30 tablet, Rfl: 11   Prenatal Vit-Fe Fumarate-FA (PRENATAL MULTIVITAMIN) TABS tablet, Take 1 tablet by mouth daily at 12 noon., Disp: , Rfl:    valACYclovir (VALTREX) 1000 MG tablet, Take 1,000 mg by mouth 2 (two) times daily., Disp: , Rfl:   Current Facility-Administered Medications:    cabotegravir & rilpivirine ER (CABENUVA) 400 & 600  MG/2ML injection 1 kit, 1 kit, Intramuscular, Once, Jeanine Luz D, FNP   Objective:     Vitals:   02/09/23 1414  Height: 5\' 2"  (1.575 m)      Body mass index is 40.6 kg/m.    Physical Exam:    Gen: Appears well, nad, nontoxic and pleasant Neuro:sensation intact, strength is 5/5 with df/pf/inv/ev, muscle tone wnl Skin: no suspicious lesion or defmority Psych: A&O, appropriate mood and affect   Left shoulder: no deformity, swelling or muscle wasting No scapular winging FF 180, abd 180, int 0, ext 90.  Painful at end range ROM throughout TTP trapezius, cervical paraspinal, thoracic paraspinal, rhomboid NTTP over the [place], clavicle, ac, coracoid, biceps groove, humerus, deltoid,    Electronically signed by:  Candice [name] D.[name] 2:28 PM 02/09/23

## 2023-02-06 ENCOUNTER — Other Ambulatory Visit: Payer: BC Managed Care – PPO

## 2023-02-06 ENCOUNTER — Ambulatory Visit (INDEPENDENT_AMBULATORY_CARE_PROVIDER_SITE_OTHER): Payer: BC Managed Care – PPO | Admitting: Obstetrics and Gynecology

## 2023-02-06 ENCOUNTER — Other Ambulatory Visit: Payer: Self-pay

## 2023-02-06 VITALS — BP 125/74 | HR 95 | Wt 222.0 lb

## 2023-02-06 DIAGNOSIS — O0990 Supervision of high risk pregnancy, unspecified, unspecified trimester: Secondary | ICD-10-CM

## 2023-02-06 DIAGNOSIS — O0992 Supervision of high risk pregnancy, unspecified, second trimester: Secondary | ICD-10-CM

## 2023-02-06 DIAGNOSIS — B20 Human immunodeficiency virus [HIV] disease: Secondary | ICD-10-CM

## 2023-02-06 DIAGNOSIS — F5089 Other specified eating disorder: Secondary | ICD-10-CM

## 2023-02-06 DIAGNOSIS — Z6841 Body Mass Index (BMI) 40.0 and over, adult: Secondary | ICD-10-CM

## 2023-02-06 DIAGNOSIS — G35 Multiple sclerosis: Secondary | ICD-10-CM

## 2023-02-06 DIAGNOSIS — G35D Multiple sclerosis, unspecified: Secondary | ICD-10-CM

## 2023-02-06 DIAGNOSIS — Z21 Asymptomatic human immunodeficiency virus [HIV] infection status: Secondary | ICD-10-CM

## 2023-02-06 DIAGNOSIS — Z23 Encounter for immunization: Secondary | ICD-10-CM | POA: Diagnosis not present

## 2023-02-06 DIAGNOSIS — Z3A25 25 weeks gestation of pregnancy: Secondary | ICD-10-CM

## 2023-02-06 DIAGNOSIS — O34219 Maternal care for unspecified type scar from previous cesarean delivery: Secondary | ICD-10-CM

## 2023-02-06 DIAGNOSIS — Z8619 Personal history of other infectious and parasitic diseases: Secondary | ICD-10-CM

## 2023-02-06 DIAGNOSIS — O99210 Obesity complicating pregnancy, unspecified trimester: Secondary | ICD-10-CM

## 2023-02-06 DIAGNOSIS — D563 Thalassemia minor: Secondary | ICD-10-CM

## 2023-02-06 MED ORDER — VITAFOL ULTRA 29-0.6-0.4-200 MG PO CAPS: 1.0000 | ORAL_CAPSULE | Freq: Every day | ORAL | 11 refills | Status: DC

## 2023-02-06 MED ORDER — M-NATAL PLUS 27-1 MG PO TABS
1.0000 | ORAL_TABLET | Freq: Every day | ORAL | 11 refills | Status: DC
Start: 2023-02-06 — End: 2023-07-20

## 2023-02-06 NOTE — Addendum Note (Signed)
Addended by: Kathee Delton on: 02/06/2023 11:07 AM   Modules accepted: Orders

## 2023-02-06 NOTE — Progress Notes (Signed)
Patient reports craving starch/dirt for the past month

## 2023-02-06 NOTE — Progress Notes (Signed)
PRENATAL VISIT NOTE  Subjective:  Candice [name] is a 37 y.o. G3P2002 at [redacted]w[redacted]d being seen today for ongoing prenatal care.  She is currently monitored for the following issues for this high-risk pregnancy and has OBESITY, NOS; Genital herpes; Major depressive disorder, recurrent episode, moderate (HCC); HIV-1 (human immunodeficiency virus I) (HCC); MS (multiple sclerosis) (HCC); Depression, major, single episode, mild (HCC); Supervision of high risk pregnancy, antepartum; Alpha thalassemia silent carrier; and Pregnancy with history of cesarean section, antepartum on their problem list.  Patient reports  no OB complaints .  Contractions: Not present. Vag. Bleeding: None.  Movement: Present. Denies leaking of fluid.   The following portions of the patient's history were reviewed and updated as appropriate: allergies, current medications, past family history, past medical history, past social history, past surgical history and problem list.   Objective:   Vitals:   02/06/23 0828  BP: 125/74  Pulse: 95  Weight: 222 lb (100.7 kg)    Fetal Status: Fetal Heart Rate (bpm): 160   Movement: Present     General:  Alert, oriented and cooperative. Patient is in no acute distress.  Skin: Skin is warm and dry. No rash noted.   Cardiovascular: Normal heart rate noted  Respiratory: Normal respiratory effort, no problems with respiration noted  Abdomen: Soft, gravid, appropriate for gestational age.  Pain/Pressure: Present     Pelvic: Cervical exam deferred        Extremities: Normal range of motion.  Edema: Trace  Mental Status: Normal mood and affect. Normal behavior. Normal judgment and thought content.   Assessment and Plan:  Pregnancy: G3P2002 at [redacted]w[redacted]d 1. Supervision of high risk pregnancy, antepartum 28wk labs today - Prenat-Fe Poly-Methfol-FA-DHA (VITAFOL ULTRA) 29-0.6-0.4-200 MG CAPS; Take 1 capsule by mouth daily. (Patient not taking: Reported on 02/06/2023)  Dispense: 30 capsule;  Refill: 11 - Tdap vaccine greater than or equal to 7yo IM - Anemia Profile B  2. Pica F/u panel. D/w her that may need po or iv iron; pt is silent carrier alpha thal - Anemia Profile B  3. [redacted] weeks gestation of pregnancy  4. Pregnancy with history of cesarean section, antepartum Two prior c-sections. Last one in 2013. Pt appears that she had another LTCS after being 9cm and unchange for several hours, with asynclitism noted.  I told her we'll d/w her more at around 28-32w re: delivery mode  5. MS (multiple sclerosis) (HCC) Followed by [facility], last visit 8/12; pt current only no meds. Feels like s/s may be getting worse. Has scheduled lidocaine injections for upper back.   6. HIV-1 (human immunodeficiency virus I) (HCC) VL undectable on 7/26. Followed by ID. Currently on Cabenuva.   7. BMI 40.0-44.9, adult (HCC) Weight stable  8. Obesity in pregnancy  9. History of herpes genitalis  Preterm labor symptoms and general obstetric precautions including but not limited to vaginal bleeding, contractions, leaking of fluid and fetal movement were reviewed in detail with the patient. Please refer to After Visit Summary for other counseling recommendations.   No follow-ups on file.  Future Appointments  Date Time Provider Department Center  02/06/2023  8:50 AM [facility]  02/09/2023  2:15 PM Richardean Sale, DO [facility] None  02/13/2023 10:45 AM Jennette Kettle, RPH-CPP [facility]  02/18/2023 10:45 AM [facility]  02/24/2023  9:15 AM [facility] NURSE [facility]  02/24/2023  9:30 AM [facility] US3 [facility]  02/27/2023  8:00 AM Richardean Sale, DO [facility]  None  02/27/2023  9:45 AM Veryl Speak, FNP [facility]  03/13/2023  9:45 AM Jennette Kettle, RPH-CPP [facility]  03/23/2023  9:15 AM [facility] NURSE [facility]  03/23/2023  9:30 AM [facility] US2 [facility]  04/10/2023 10:45 AM Jennette Kettle, RPH-CPP [facility]   05/11/2023  9:30 AM Jannette Fogo, Cherylann Ratel, RPH-CPP [facility]  07/15/2023 10:50 AM Drema Dallas, DO [facility] None    Jerome Bing, MD

## 2023-02-09 ENCOUNTER — Ambulatory Visit (INDEPENDENT_AMBULATORY_CARE_PROVIDER_SITE_OTHER): Payer: BC Managed Care – PPO | Admitting: Sports Medicine

## 2023-02-09 VITALS — Ht 62.0 in

## 2023-02-09 DIAGNOSIS — M542 Cervicalgia: Secondary | ICD-10-CM

## 2023-02-09 DIAGNOSIS — G8929 Other chronic pain: Secondary | ICD-10-CM

## 2023-02-09 DIAGNOSIS — Z3A26 26 weeks gestation of pregnancy: Secondary | ICD-10-CM | POA: Diagnosis not present

## 2023-02-09 DIAGNOSIS — M25512 Pain in left shoulder: Secondary | ICD-10-CM | POA: Diagnosis not present

## 2023-02-09 NOTE — Patient Instructions (Signed)
Good to see you Trigger point injections today Folow up as scheduled

## 2023-02-10 LAB — GLUCOSE TOLERANCE, 2 HOURS W/ 1HR
Glucose, 1 hour: 163 mg/dL (ref 70–179)
Glucose, 2 hour: 88 mg/dL (ref 70–152)
Glucose, Fasting: 111 mg/dL — ABNORMAL HIGH (ref 70–91)

## 2023-02-10 LAB — CBC
Hematocrit: 31.5 % — ABNORMAL LOW (ref 34.0–46.6)
Hemoglobin: 10.2 g/dL — ABNORMAL LOW (ref 11.1–15.9)
MCH: 26.7 pg (ref 26.6–33.0)
MCHC: 32.4 g/dL (ref 31.5–35.7)
MCV: 83 fL (ref 79–97)
Platelets: 369 10*3/uL (ref 150–450)
RBC: 3.82 x10E6/uL (ref 3.77–5.28)
RDW: 13.8 % (ref 11.7–15.4)
WBC: 15.6 10*3/uL — ABNORMAL HIGH (ref 3.4–10.8)

## 2023-02-10 LAB — HIV ANTIBODY (ROUTINE TESTING W REFLEX): HIV Screen 4th Generation wRfx: REACTIVE

## 2023-02-10 LAB — HIV-1/HIV-2 QUALITATIVE RNA
HIV-1 RNA, Qualitative: NONREACTIVE
HIV-2 RNA, Qualitative: NONREACTIVE

## 2023-02-10 LAB — HIV 1/2 AB DIFFERENTIATION
HIV 1 Ab: UNDETERMINED
HIV 2 Ab: NONREACTIVE

## 2023-02-10 LAB — RPR: RPR Ser Ql: NONREACTIVE

## 2023-02-11 ENCOUNTER — Telehealth: Payer: Self-pay | Admitting: General Practice

## 2023-02-11 ENCOUNTER — Other Ambulatory Visit: Payer: Self-pay | Admitting: Obstetrics and Gynecology

## 2023-02-11 DIAGNOSIS — O24410 Gestational diabetes mellitus in pregnancy, diet controlled: Secondary | ICD-10-CM

## 2023-02-11 DIAGNOSIS — D509 Iron deficiency anemia, unspecified: Secondary | ICD-10-CM | POA: Insufficient documentation

## 2023-02-11 DIAGNOSIS — O24419 Gestational diabetes mellitus in pregnancy, unspecified control: Secondary | ICD-10-CM | POA: Insufficient documentation

## 2023-02-11 HISTORY — DX: Gestational diabetes mellitus in pregnancy, unspecified control: O24.419

## 2023-02-11 MED ORDER — CONTOUR TEST VI STRP: ORAL_STRIP | 12 refills | Status: DC

## 2023-02-11 MED ORDER — ACCU-CHEK SOFTCLIX LANCETS MISC
12 refills | Status: DC
Start: 2023-02-11 — End: 2023-03-31

## 2023-02-11 MED ORDER — BAYER CONTOUR LINK 2.4 W/DEVICE KIT: 1.0000 | PACK | Freq: Once | 0 refills | Status: AC

## 2023-02-11 MED ORDER — FERROUS SULFATE 324 MG PO TBEC: 324.0000 mg | DELAYED_RELEASE_TABLET | ORAL | 1 refills | Status: DC

## 2023-02-11 NOTE — Telephone Encounter (Signed)
-----   Message from Blunt sent at 02/11/2023  1:59 PM EDT ----- She has GDM. Please set her up with the usual classes, supplies, etc.

## 2023-02-11 NOTE — Telephone Encounter (Signed)
Called patient and informed her of results. Discussed testing supplies sent to pharmacy and referral placed to nutrition & diabetes. Patient verbalized understanding.

## 2023-02-13 ENCOUNTER — Other Ambulatory Visit: Payer: Self-pay

## 2023-02-13 ENCOUNTER — Other Ambulatory Visit (HOSPITAL_COMMUNITY): Payer: Self-pay

## 2023-02-13 ENCOUNTER — Ambulatory Visit (INDEPENDENT_AMBULATORY_CARE_PROVIDER_SITE_OTHER): Payer: BC Managed Care – PPO | Admitting: Pharmacist

## 2023-02-13 ENCOUNTER — Encounter: Payer: Self-pay | Admitting: Obstetrics and Gynecology

## 2023-02-13 ENCOUNTER — Telehealth: Payer: Self-pay

## 2023-02-13 DIAGNOSIS — Z21 Asymptomatic human immunodeficiency virus [HIV] infection status: Secondary | ICD-10-CM

## 2023-02-13 DIAGNOSIS — G35 Multiple sclerosis: Secondary | ICD-10-CM

## 2023-02-13 MED ORDER — CABOTEGRAVIR & RILPIVIRINE ER 400 & 600 MG/2ML IM SUER
1.0000 | Freq: Once | INTRAMUSCULAR | Status: AC
Start: 2023-02-13 — End: 2023-02-13
  Administered 2023-02-13: 1 via INTRAMUSCULAR

## 2023-02-13 NOTE — Telephone Encounter (Signed)
-----   Message from Cira Servant sent at 02/13/2023  6:45 AM EDT ----- Vit D is a little low.  I would have her ask her OBGYN if it is okay to increase D3 from 5000 international unit daily to 7000 international units daily.  Repeat level in 6 months (about a week prior to follow up with me)

## 2023-02-13 NOTE — Progress Notes (Signed)
HPI: Candice [name] is a 37 y.o. female who presents to the [facility] pharmacy clinic for [place] administration.  Patient Active Problem List   Diagnosis Date Noted   Iron deficiency anemia during pregnancy 02/11/2023   GDM (gestational diabetes mellitus) 02/11/2023   Pregnancy with history of cesarean section, antepartum 01/21/2023   Alpha thalassemia silent carrier 11/28/2022   Supervision of high risk pregnancy, antepartum 11/10/2022   Depression, major, single episode, mild (HCC) 01/13/2022   MS (multiple sclerosis) (HCC) 05/21/2020   HIV-1 (human immunodeficiency virus I) (HCC) 09/07/2019   Major depressive disorder, recurrent episode, moderate (HCC) 06/08/2013   Genital herpes 02/16/2013   OBESITY, NOS 08/20/2006    Patient's Medications  New Prescriptions   No medications on file  Previous Medications   ACCU-CHEK SOFTCLIX LANCETS LANCETS    Use as instructed QID   ACETAMINOPHEN (TYLENOL) 500 MG TABLET    Take 500 mg by mouth every 6 (six) hours as needed for mild pain.   CABOTEGRAVIR & RILPIVIRINE ER (CABENUVA) 400 & 600 MG/2ML INJECTION    Inject 1 kit into the muscle every 30 (thirty) days.   FERROUS SULFATE 324 MG TBEC    Take 1 tablet (324 mg total) by mouth every other day.   GLUCOSE BLOOD (CONTOUR TEST) TEST STRIP    Use as instructed   PRENATAL VIT-FE FUMARATE-FA (M-NATAL PLUS) 27-1 MG TABS    Take 1 tablet by mouth daily.   PRENATAL VIT-FE FUMARATE-FA (PRENATAL MULTIVITAMIN) TABS TABLET    Take 1 tablet by mouth daily at 12 noon.   VALACYCLOVIR (VALTREX) 1000 MG TABLET    Take 1,000 mg by mouth 2 (two) times daily.  Modified Medications   No medications on file  Discontinued Medications   No medications on file    Allergies: Allergies  Allergen Reactions   Contrast Media [Iodinated Contrast Media] Nausea And Vomiting    Past Medical History: Past Medical History:  Diagnosis Date   Abnormal MRI, spine 04/06/2020   Arthralgia 02/18/2022   Back pain  10/13/2011   Complication of anesthesia    epidural with last pregnancy made entire left side numb, had to d/c   Depression    Frequent urination 09/11/2021   GERD (gastroesophageal reflux disease) 05/21/2020   Headache    HIV-1 (human immunodeficiency virus I) (HCC)    HSV 06/28/2007   Hx of migraines    Multiple sclerosis (HCC)    Right leg numbness 02/07/2020   Vitamin D deficiency 06/20/2022    Social History: Social History   Socioeconomic History   Marital status: Single    Spouse name: Not on file   Number of children: 2   Years of education: 14   Highest education level: Not on file  Occupational History    Comment: Associate Professor  Tobacco Use   Smoking status: Every Day    Current packs/day: 0.25    Types: Cigarettes    Passive exposure: Current   Smokeless tobacco: Never   Tobacco comments:    Trying to quit due to pregnancy   Vaping Use   Vaping status: Never Used  Substance and Sexual Activity   Alcohol use: Not Currently    Comment: occasional   Drug use: Not Currently    Types: Marijuana    Comment: patient reports last THC use beginning of April   Sexual activity: Not Currently    Partners: Male    Birth control/protection: None    Comment: Decline condoms  Other Topics Concern   Not on file  Social History Narrative   Works as Associate Professor.   FOB of both children lives in [place]; will be some what involved but mother is no longer dating him.   Social Determinants of Health   Financial Resource Strain: Not on file  Food Insecurity: Not on file  Transportation Needs: Not on file  Physical Activity: Not on file  Stress: Not on file  Social Connections: Not on file    Labs: Lab Results  Component Value Date   HIV1RNAQUANT Not Detected 01/16/2023   HIV1RNAQUANT Not Detected 12/12/2022   HIV1RNAQUANT Not Detected 09/11/2022   CD4TABS 684 09/11/2022   CD4TABS 1,026 11/22/2020   CD4TABS 1,078 03/29/2020    RPR and STI Lab Results   Component Value Date   LABRPR Non Reactive 02/06/2023   LABRPR Non Reactive 09/17/2022   LABRPR NON-REACTIVE 12/13/2021   LABRPR NON-REACTIVE 06/20/2021   LABRPR Non Reactive 10/25/2020    STI Results GC CT  09/17/2022 10:00 AM Negative  Negative   08/29/2022  3:43 PM Negative  Negative   02/14/2022  9:13 AM Negative    Negative  Negative    Negative   12/13/2021  9:09 AM Negative    Negative  Negative    Negative   07/29/2021  4:28 PM Negative  Negative   06/20/2021  9:20 AM Negative  Negative   12/21/2020  3:45 PM Negative  Negative   11/22/2020  9:58 AM Negative  Negative   10/25/2020  9:52 AM Negative  Negative   09/05/2019  4:31 PM Negative  Negative   10/16/2017 12:00 AM Negative  Negative   06/08/2017 12:00 AM Negative  Negative   10/14/2016 12:00 AM Negative  Negative   02/15/2016 12:00 AM Negative  Negative   07/13/2015 12:00 AM Negative  Negative   04/13/2015 12:00 AM Negative  Negative   09/24/2013 12:00 AM NG: Negative  CT: Negative   02/11/2011  3:36 PM  NEGATIVE   01/29/2010  7:00 PM  NEGATIVE   05/02/2009  8:17 PM  NEGATIVE     Hepatitis B Lab Results  Component Value Date   HEPBSAB REACTIVE (A) 09/08/2019   HEPBSAG Negative 09/17/2022   HEPBCAB NON-REACTIVE 09/08/2019   Hepatitis C Lab Results  Component Value Date   HEPCAB NON-REACTIVE 09/08/2019   Hepatitis A Lab Results  Component Value Date   HAV BORDERLINE (A) 09/08/2019   Lipids: Lab Results  Component Value Date   CHOL 183 11/22/2020   TRIG 80 11/22/2020   HDL 51 11/22/2020   CHOLHDL 3.6 11/22/2020   VLDL 10 02/14/2013   LDLCALC 115 (H) 11/22/2020    TARGET DATE:  The 25th of the month  Current HIV Regimen: Cabenuva  Assessment: [name] presents today for their maintenance Cabenuva injections. Initial/past injections were tolerated well without issues. No problems with systemic effects of injections. She is currently [redacted] weeks pregnant.   Administered cabotegravir 400 mg/72mL  in left upper outer quadrant of the gluteal muscle. Administered rilpivirine 600 mg/35mL in the right upper outer quadrant of the gluteal muscle. Monitored patient for 10 minutes after injection. Injections were tolerated well without issue. Patient will follow up in 2 months for next injection. Will check HIV RNA today. Patient will follow up with Tammy Sours in September given limited availability during her injection windows.   Plan: - Cabenuva injections administered - Check HIV RNA - Follow up with Tammy Sours on 9/6 - Next injections scheduled for  10/18 with me and 11/18 with Cassie  - Call with any issues or questions  Margarite Gouge, PharmD, CPP, BCIDP, AAHIVP Clinical Pharmacist Practitioner Infectious Diseases Clinical Pharmacist [facility]

## 2023-02-13 NOTE — Telephone Encounter (Signed)
Patient advised of results.

## 2023-02-15 LAB — HIV-1 RNA QUANT-NO REFLEX-BLD
HIV 1 RNA Quant: NOT DETECTED {copies}/mL
HIV-1 RNA Quant, Log: NOT DETECTED {Log_copies}/mL

## 2023-02-16 ENCOUNTER — Telehealth: Payer: Self-pay

## 2023-02-16 NOTE — Telephone Encounter (Signed)
[name]  Patient's medication (Cabenuva 400-600mg ) have been couriered to [facility] from [employer] [employer] [facility] and was administered on the patient office visit on 02/13/23.  Clearance Coots , CPhT Specialty Pharmacy Patient [facility] for [facility] Phone: [phone] Fax:  [phone]

## 2023-02-18 ENCOUNTER — Encounter: Payer: BC Managed Care – PPO | Admitting: Dietician

## 2023-02-18 DIAGNOSIS — O24419 Gestational diabetes mellitus in pregnancy, unspecified control: Secondary | ICD-10-CM | POA: Insufficient documentation

## 2023-02-19 ENCOUNTER — Other Ambulatory Visit (HOSPITAL_COMMUNITY): Payer: Self-pay

## 2023-02-23 ENCOUNTER — Encounter: Payer: Self-pay | Admitting: Dietician

## 2023-02-23 NOTE — Progress Notes (Signed)
Patient was seen on 02/18/2023 for Gestational Diabetes self-management class at the [facility]. The following learning objectives were met by the patient during this course:  States the definition of Gestational Diabetes States why dietary management is important in controlling blood glucose Describes the effects each nutrient has on blood glucose levels Demonstrates ability to create a balanced meal plan Demonstrates carbohydrate counting  States when to check blood glucose levels Demonstrates proper blood glucose monitoring techniques States the effect of stress and exercise on blood glucose levels States the importance of limiting caffeine and abstaining from alcohol and smoking  Blood glucose monitor given: AccuChek Guide Me Lot # [id number] Exp: 01/09/2024 Blood glucose reading: 96  Patient instructed to monitor glucose levels: FBS: 60 - <90 1 hour: <140 2 hour: <120  *Patient received handouts: Nutrition Diabetes and Pregnancy Carbohydrate Counting List  Patient will be seen for follow-up as needed.

## 2023-02-24 ENCOUNTER — Other Ambulatory Visit: Payer: Self-pay | Admitting: *Deleted

## 2023-02-24 ENCOUNTER — Ambulatory Visit: Payer: BC Managed Care – PPO | Admitting: *Deleted

## 2023-02-24 ENCOUNTER — Ambulatory Visit: Payer: BC Managed Care – PPO | Attending: Maternal & Fetal Medicine

## 2023-02-24 ENCOUNTER — Other Ambulatory Visit (HOSPITAL_COMMUNITY): Payer: Self-pay

## 2023-02-24 VITALS — BP 122/70 | HR 90

## 2023-02-24 DIAGNOSIS — O09523 Supervision of elderly multigravida, third trimester: Secondary | ICD-10-CM

## 2023-02-24 DIAGNOSIS — O24419 Gestational diabetes mellitus in pregnancy, unspecified control: Secondary | ICD-10-CM

## 2023-02-24 DIAGNOSIS — O09899 Supervision of other high risk pregnancies, unspecified trimester: Secondary | ICD-10-CM | POA: Diagnosis not present

## 2023-02-24 DIAGNOSIS — G35 Multiple sclerosis: Secondary | ICD-10-CM | POA: Diagnosis not present

## 2023-02-24 DIAGNOSIS — O34219 Maternal care for unspecified type scar from previous cesarean delivery: Secondary | ICD-10-CM

## 2023-02-24 DIAGNOSIS — B20 Human immunodeficiency virus [HIV] disease: Secondary | ICD-10-CM

## 2023-02-24 DIAGNOSIS — O99013 Anemia complicating pregnancy, third trimester: Secondary | ICD-10-CM

## 2023-02-24 DIAGNOSIS — O99213 Obesity complicating pregnancy, third trimester: Secondary | ICD-10-CM

## 2023-02-24 DIAGNOSIS — D563 Thalassemia minor: Secondary | ICD-10-CM

## 2023-02-24 DIAGNOSIS — O99212 Obesity complicating pregnancy, second trimester: Secondary | ICD-10-CM

## 2023-02-24 DIAGNOSIS — O0990 Supervision of high risk pregnancy, unspecified, unspecified trimester: Secondary | ICD-10-CM | POA: Insufficient documentation

## 2023-02-24 DIAGNOSIS — Z362 Encounter for other antenatal screening follow-up: Secondary | ICD-10-CM | POA: Insufficient documentation

## 2023-02-24 DIAGNOSIS — O09529 Supervision of elderly multigravida, unspecified trimester: Secondary | ICD-10-CM | POA: Insufficient documentation

## 2023-02-24 DIAGNOSIS — O98712 Human immunodeficiency virus [HIV] disease complicating pregnancy, second trimester: Secondary | ICD-10-CM

## 2023-02-24 DIAGNOSIS — O99353 Diseases of the nervous system complicating pregnancy, third trimester: Secondary | ICD-10-CM | POA: Diagnosis not present

## 2023-02-24 DIAGNOSIS — Z3A28 28 weeks gestation of pregnancy: Secondary | ICD-10-CM

## 2023-02-24 DIAGNOSIS — E669 Obesity, unspecified: Secondary | ICD-10-CM

## 2023-02-26 NOTE — Progress Notes (Signed)
[facility]ice [name] [address] [place] [address] Phone: [phone]   Assessment and Plan:     1. Neck pain 2. Chronic bilateral thoracic back pain 3. Chronic bilateral low back pain without sciatica 4. [redacted] weeks gestation of pregnancy 5. Somatic dysfunction of cervical region 6. Somatic dysfunction of thoracic region 7. Somatic dysfunction of lumbar region 8. Somatic dysfunction of pelvic region 9. Somatic dysfunction of sacral region   - Chronic with exacerbation, subsequent sports medicine visit -Continued multiple musculoskeletal complaints most prominent being in neck rating into trapezius but also including thoracic and lumbar spine - Patient has had mild relief after lidocaine trigger point injections to trapezius which have decreased muscle spasms - Patient has received relief with OMT in the past.  Elects for repeat OMT today.  Tolerated well per note below. - Decision today to treat with OMT was based on Physical Exam   After verbal consent patient was treated with HVLA (high velocity low amplitude), ME (muscle energy), FPR (flex positional release), ST (soft tissue), PC/PD (Pelvic Compression/ Pelvic Decompression) techniques in cervical, sacrum, thoracic, lumbar, and pelvic areas. Patient tolerated the procedure well with improvement in symptoms.  Patient educated on potential side effects of soreness and recommended to rest, hydrate, and use Tylenol as needed for pain control.   Pertinent previous records reviewed include none   Follow Up: 3 weeks for reevaluation.  Could consider repeat OMT   Subjective:   I, Candice [name], am serving as a Neurosurgeon for Candice [name]   Chief Complaint: thoracic back pain    HPI:    02/17/2022 Patient is a 37 year old female complaining of thoracic back pain. Patient states  Beginning around 2018, she began having intermittent episodes of numbness, weakness and dizziness.  In May or June  of that year, she began having a recurrence of symptoms, including right flank pain and numbness radiating down her right leg with some associated right leg weakness.  She felt off balance.  She saw neurology at that time.  MRI of thoracic spine revealed subtle enhancing hyperintense focus at the T8-T9 region.would like to discuss OMT    03/10/2022 Patient states that she is alright, left side back is still tight and shoulders    04/07/2022 Patient states that she is okay left side is still crazy even with PT   05/05/2022 Patient states she is good needs a tune up   06/02/2022 Patient states that she is pretty good    06/30/2022 Patient states left side is a problem , PT told her to re check rotator cuff   07/22/2022 Patient states she is pretty good, CSI was a life saver    09/09/2022 Patient states that she has been having intermittent flares    10/07/2022 Patient states that she would still like to get OMT while pregnant    11/04/2022 Patient states left side is  jacked up again    11/27/2022 Patient states shoulder neck and regular adjustment    01/02/2023 Patient states back is flared , but other than that pretty good    01/30/2023 Patient states left side is starting to act up and spasm    02/09/2023 Patient states that her shoulder pain has started coming back in the last 2-3 weeks and it is burning.  02/27/2023 Patient states that she is decent. Left sided neck tightness    Relevant Historical Information: Multiple sclerosis    Additional pertinent review of systems  negative.  Current Outpatient Medications  Medication Sig Dispense Refill   Accu-Chek Softclix Lancets lancets Use as instructed QID 100 each 12   acetaminophen (TYLENOL) 500 MG tablet Take 500 mg by mouth every 6 (six) hours as needed for mild pain.     cabotegravir & rilpivirine ER (CABENUVA) 400 & 600 MG/2ML injection Inject 1 kit into the muscle every 30 (thirty) days. 4 mL 5   cholecalciferol (VITAMIN  D3) 25 MCG (1000 UNIT) tablet Take 7 Units by mouth daily.     ferrous sulfate 324 MG TBEC Take 1 tablet (324 mg total) by mouth every other day. 40 tablet 1   glucose blood (CONTOUR TEST) test strip Use as instructed 100 each 12   Prenatal Vit-Fe Fumarate-FA (M-NATAL PLUS) 27-1 MG TABS Take 1 tablet by mouth daily. 30 tablet 11   Prenatal Vit-Fe Fumarate-FA (PRENATAL MULTIVITAMIN) TABS tablet Take 1 tablet by mouth daily at 12 noon.     valACYclovir (VALTREX) 1000 MG tablet Take 1,000 mg by mouth 2 (two) times daily.     Current Facility-Administered Medications  Medication Dose Route Frequency Provider Last Rate Last Admin   cabotegravir & rilpivirine ER (CABENUVA) 400 & 600 MG/2ML injection 1 kit  1 kit Intramuscular Once Jeanine Luz D, FNP          Objective:     Vitals:   02/27/23 0757  BP: 122/74  Pulse: (!) 104  SpO2: 98%  Weight: 228 lb (103.4 kg)  Height: 5\' 2"  (1.575 m)      Body mass index is 41.7 kg/m.    Physical Exam:     General: Well-appearing, cooperative, sitting comfortably in no acute distress.   OMT Physical Exam:  ASIS Compression Test: Positive Right Cervical: TTP paraspinal, C3-5 RL SL, C1 RR, C6 RRSL Sacrum: Positive sphinx, TTP bilateral sacral base Thoracic: TTP paraspinal, T3-5 RRSL, T6-8 RLSR Lumbar: TTP paraspinal, L1-3 RRSL Pelvis: Right anterior innominate  Electronically signed by:  Candice [name] [name] 8:14 AM 02/27/23

## 2023-02-27 ENCOUNTER — Encounter: Payer: Self-pay | Admitting: Family

## 2023-02-27 ENCOUNTER — Ambulatory Visit (INDEPENDENT_AMBULATORY_CARE_PROVIDER_SITE_OTHER): Payer: BC Managed Care – PPO | Admitting: Family

## 2023-02-27 ENCOUNTER — Other Ambulatory Visit: Payer: Self-pay

## 2023-02-27 ENCOUNTER — Ambulatory Visit (INDEPENDENT_AMBULATORY_CARE_PROVIDER_SITE_OTHER): Payer: BC Managed Care – PPO | Admitting: Sports Medicine

## 2023-02-27 VITALS — BP 122/74 | HR 104 | Ht 62.0 in | Wt 228.0 lb

## 2023-02-27 VITALS — BP 123/80 | HR 78 | Resp 16 | Ht 62.0 in | Wt 228.0 lb

## 2023-02-27 DIAGNOSIS — O0993 Supervision of high risk pregnancy, unspecified, third trimester: Secondary | ICD-10-CM | POA: Diagnosis not present

## 2023-02-27 DIAGNOSIS — Z3A29 29 weeks gestation of pregnancy: Secondary | ICD-10-CM

## 2023-02-27 DIAGNOSIS — M542 Cervicalgia: Secondary | ICD-10-CM

## 2023-02-27 DIAGNOSIS — G8929 Other chronic pain: Secondary | ICD-10-CM

## 2023-02-27 DIAGNOSIS — M9902 Segmental and somatic dysfunction of thoracic region: Secondary | ICD-10-CM | POA: Diagnosis not present

## 2023-02-27 DIAGNOSIS — M9901 Segmental and somatic dysfunction of cervical region: Secondary | ICD-10-CM

## 2023-02-27 DIAGNOSIS — M546 Pain in thoracic spine: Secondary | ICD-10-CM | POA: Diagnosis not present

## 2023-02-27 DIAGNOSIS — B20 Human immunodeficiency virus [HIV] disease: Secondary | ICD-10-CM | POA: Diagnosis not present

## 2023-02-27 DIAGNOSIS — M9905 Segmental and somatic dysfunction of pelvic region: Secondary | ICD-10-CM

## 2023-02-27 DIAGNOSIS — O0990 Supervision of high risk pregnancy, unspecified, unspecified trimester: Secondary | ICD-10-CM

## 2023-02-27 DIAGNOSIS — Z3A28 28 weeks gestation of pregnancy: Secondary | ICD-10-CM | POA: Diagnosis not present

## 2023-02-27 DIAGNOSIS — M545 Low back pain, unspecified: Secondary | ICD-10-CM | POA: Diagnosis not present

## 2023-02-27 DIAGNOSIS — M9903 Segmental and somatic dysfunction of lumbar region: Secondary | ICD-10-CM

## 2023-02-27 DIAGNOSIS — M9904 Segmental and somatic dysfunction of sacral region: Secondary | ICD-10-CM

## 2023-02-27 NOTE — Patient Instructions (Addendum)
Nice to see you.  Continue to take your medication.   Plan for follow up in 4 months or sooner if needed with lab work on the same day.  Have a great day and stay safe!

## 2023-02-27 NOTE — Assessment & Plan Note (Addendum)
Scarleth continues to have well controlled virus with good adherence and tolerance to monthly Cabenuva and is currently approximately [redacted] weeks pregnant. Reviewed lab work, pregnancy and HIV, and new breast/chest feeding guidelines. With undetectable viral load would be eligible for vaginal delivery and she will discuss plan with Obstetrics. She wishes to return to q 2 month dosing of Cabenuva following pregnancy which is reasonable. For now continue current dose of monthly Cabenuva. Will plan of follow up in 4 months or sooner if needed.

## 2023-02-27 NOTE — Progress Notes (Signed)
Brief Narrative   Patient ID: Candice [name], female    DOB: 08-Jun-1986, 37 y.o.   MRN: [id number]  Ms. Pollard is a 37 y/o female diagnosed with HIV-1 disease diagnosed in March 2021 with risk factor of heterosexual contact. Initial CD4 count was 757 and viral load of 421. Genotype was Subtype B with no significant medication resistant mutations. No history of opportunistic infection. [id number] negative. Entered care at [facility] Stage 1. Previous ART exposure to [place] and currently [place].   Subjective:    Chief Complaint  Patient presents with   Follow-up    HPI:  Candice [name] is a 37 y.o. female with HIV last seen on 09/11/22 with well controlled virus and good adherence and tolerance to [place] with new pregnancy. After discussion decided to continue on Cabenuva and since that time is currently on monthly dosing since entering the second trimester. Most recent lab work completed on 02/13/23 with undetectable viral load. Here today for follow up.   [name] has been doing well since her last office visit and continues to tolerate Cabenuva with no adverse side effects. Has not yet discussed labor/delivery plans with Obstetrics. No new concerns/complaints. Has follow up with Obstetrics planned. Condoms offered.   Denies fevers, chills, night sweats, headaches, changes in vision, neck pain/stiffness, nausea, diarrhea, vomiting, lesions or rashes.    Allergies  Allergen Reactions   Contrast Media [Iodinated Contrast Media] Nausea And Vomiting      Outpatient Medications Prior to Visit  Medication Sig Dispense Refill   Accu-Chek Softclix Lancets lancets Use as instructed QID 100 each 12   acetaminophen (TYLENOL) 500 MG tablet Take 500 mg by mouth every 6 (six) hours as needed for mild pain.     ferrous sulfate 324 MG TBEC Take 1 tablet (324 mg total) by mouth every other day. 40 tablet 1   glucose blood (CONTOUR TEST) test strip Use as instructed 100 each 12   Prenatal  Vit-Fe Fumarate-FA (M-NATAL PLUS) 27-1 MG TABS Take 1 tablet by mouth daily. 30 tablet 11   Prenatal Vit-Fe Fumarate-FA (PRENATAL MULTIVITAMIN) TABS tablet Take 1 tablet by mouth daily at 12 noon.     valACYclovir (VALTREX) 1000 MG tablet Take 1,000 mg by mouth 2 (two) times daily.     cabotegravir & rilpivirine ER (CABENUVA) 400 & 600 MG/2ML injection Inject 1 kit into the muscle every 30 (thirty) days. (Patient not taking: Reported on 02/27/2023) 4 mL 5   cholecalciferol (VITAMIN D3) 25 MCG (1000 UNIT) tablet Take 7 Units by mouth daily. (Patient not taking: Reported on 02/27/2023)     Facility-Administered Medications Prior to Visit  Medication Dose Route Frequency Provider Last Rate Last Admin   cabotegravir & rilpivirine ER (CABENUVA) 400 & 600 MG/2ML injection 1 kit  1 kit Intramuscular Once Veryl Speak, FNP         Past Medical History:  Diagnosis Date   Abnormal MRI, spine 04/06/2020   Arthralgia 02/18/2022   Back pain 10/13/2011   Complication of anesthesia    epidural with last pregnancy made entire left side numb, had to d/c   Depression    Frequent urination 09/11/2021   GDM (gestational diabetes mellitus) 02/11/2023   GERD (gastroesophageal reflux disease) 05/21/2020   Headache    HIV-1 (human immunodeficiency virus I) (HCC)    HSV 06/28/2007   Hx of migraines    Multiple sclerosis (HCC)    Right leg numbness 02/07/2020   Vitamin D deficiency 06/20/2022  Past Surgical History:  Procedure Laterality Date   BREAST SURGERY  2019   reduction   CESAREAN SECTION     CESAREAN SECTION  09/07/2011   Procedure: CESAREAN SECTION;  Surgeon: Lazaro Arms, MD;  Location: [facility];  Service: Gynecology;  Laterality: N/A;  Repeat cesarean section with delivery of baby boy at 48. Apgars 9/9.  Bilateral tubal ligation.   IUD REMOVAL  2005   in cervix   TUBAL LIGATION     tubal reanastomosis  08/13/2016      Review of Systems  Constitutional:  Negative for appetite  change, chills, diaphoresis, fatigue, fever and unexpected weight change.  Eyes:        Negative for acute change in vision  Respiratory:  Negative for chest tightness, shortness of breath and wheezing.   Cardiovascular:  Negative for chest pain.  Gastrointestinal:  Negative for diarrhea, nausea and vomiting.  Genitourinary:  Negative for dysuria, pelvic pain and vaginal discharge.  Musculoskeletal:  Negative for neck pain and neck stiffness.  Skin:  Negative for rash.  Neurological:  Negative for seizures, syncope, weakness and headaches.  Hematological:  Negative for adenopathy. Does not bruise/bleed easily.  Psychiatric/Behavioral:  Negative for hallucinations.       Objective:    BP 123/80   Pulse 78   Resp 16   Ht 5\' 2"  (1.575 m)   Wt 228 lb (103.4 kg)   LMP 08/09/2022   SpO2 98%   BMI 41.70 kg/m  Nursing note and vital signs reviewed.  Physical Exam Constitutional:      General: She is not in acute distress.    Appearance: She is well-developed.  Eyes:     Conjunctiva/sclera: Conjunctivae normal.  Cardiovascular:     Rate and Rhythm: Normal rate and regular rhythm.     Heart sounds: Normal heart sounds. No murmur heard.    No friction rub. No gallop.  Pulmonary:     Effort: Pulmonary effort is normal. No respiratory distress.     Breath sounds: Normal breath sounds. No wheezing or rales.  Chest:     Chest wall: No tenderness.  Abdominal:     General: Bowel sounds are normal.     Palpations: Abdomen is soft.     Tenderness: There is no abdominal tenderness.  Musculoskeletal:     Cervical back: Neck supple.  Lymphadenopathy:     Cervical: No cervical adenopathy.  Skin:    General: Skin is warm and dry.     Findings: No rash.  Neurological:     Mental Status: She is alert and oriented to person, place, and time.  Psychiatric:        Behavior: Behavior normal.        Thought Content: Thought content normal.        Judgment: Judgment normal.          02/23/2023    9:33 AM 11/10/2022    3:46 PM 10/14/2022    3:09 PM 09/17/2022    9:05 AM 09/11/2022    9:22 AM  Depression screen PHQ 2/9  Decreased Interest 0 1 1 0 0  Down, Depressed, Hopeless 0 0 0 0 0  PHQ - 2 Score 0 1 1 0 0  Altered sleeping  1 1 1    Tired, decreased energy  1 2 1    Change in appetite  1 1 1    Feeling bad or failure about yourself   1  0   Trouble concentrating  0  0 0   Moving slowly or fidgety/restless  0 0 0   Suicidal thoughts  0 0 0   PHQ-9 Score  5 5 3         Assessment & Plan:    Patient Active Problem List   Diagnosis Date Noted   Iron deficiency anemia during pregnancy 02/11/2023   GDM (gestational diabetes mellitus) 02/11/2023   Pregnancy with history of cesarean section, antepartum 01/21/2023   Alpha thalassemia silent carrier 11/28/2022   Supervision of high risk pregnancy, antepartum 11/10/2022   Depression, major, single episode, mild (HCC) 01/13/2022   MS (multiple sclerosis) (HCC) 05/21/2020   HIV-1 (human immunodeficiency virus I) (HCC) 09/07/2019   Major depressive disorder, recurrent episode, moderate (HCC) 06/08/2013   Genital herpes 02/16/2013   OBESITY, NOS 08/20/2006     Problem List Items Addressed This Visit       Other   HIV-1 (human immunodeficiency virus I) (HCC) - Primary    Amarionna continues to have well controlled virus with good adherence and tolerance to monthly Cabenuva and is currently approximately [redacted] weeks pregnant. Reviewed lab work, pregnancy and HIV, and new breast/chest feeding guidelines. With undetectable viral load would be eligible for vaginal delivery and she will discuss plan with Obstetrics. She wishes to return to q 2 month dosing of Cabenuva following pregnancy which is reasonable. For now continue current dose of monthly Cabenuva. Will plan of follow up in 4 months or sooner if needed.       Supervision of high risk pregnancy, antepartum     I am having Daissy Christene Lye maintain her prenatal  multivitamin, acetaminophen, Cabenuva, valACYclovir, M-Natal Plus, ferrous sulfate, Contour Test, Accu-Chek Softclix Lancets, and cholecalciferol. We will continue to administer cabotegravir & rilpivirine ER.   No orders of the defined types were placed in this encounter.    Follow-up: No follow-ups on file.   Marcos Eke, MSN, FNP-C Nurse Practitioner [facility] Main number: [phone]

## 2023-03-02 ENCOUNTER — Encounter: Payer: Self-pay | Admitting: Advanced Practice Midwife

## 2023-03-02 ENCOUNTER — Other Ambulatory Visit: Payer: Self-pay

## 2023-03-02 ENCOUNTER — Ambulatory Visit (INDEPENDENT_AMBULATORY_CARE_PROVIDER_SITE_OTHER): Payer: BC Managed Care – PPO | Admitting: Advanced Practice Midwife

## 2023-03-02 ENCOUNTER — Inpatient Hospital Stay (HOSPITAL_COMMUNITY)
Admission: AD | Admit: 2023-03-02 | Discharge: 2023-03-02 | Discharge status: Against Medical Advice | Payer: BC Managed Care – PPO | Attending: Obstetrics and Gynecology | Admitting: Obstetrics and Gynecology

## 2023-03-02 VITALS — BP 144/85 | HR 95 | Wt 231.0 lb

## 2023-03-02 DIAGNOSIS — Z3A29 29 weeks gestation of pregnancy: Secondary | ICD-10-CM

## 2023-03-02 DIAGNOSIS — O99013 Anemia complicating pregnancy, third trimester: Secondary | ICD-10-CM

## 2023-03-02 DIAGNOSIS — O24410 Gestational diabetes mellitus in pregnancy, diet controlled: Secondary | ICD-10-CM

## 2023-03-02 DIAGNOSIS — O0990 Supervision of high risk pregnancy, unspecified, unspecified trimester: Secondary | ICD-10-CM

## 2023-03-02 DIAGNOSIS — B20 Human immunodeficiency virus [HIV] disease: Secondary | ICD-10-CM

## 2023-03-02 DIAGNOSIS — O0993 Supervision of high risk pregnancy, unspecified, third trimester: Secondary | ICD-10-CM

## 2023-03-02 DIAGNOSIS — R030 Elevated blood-pressure reading, without diagnosis of hypertension: Secondary | ICD-10-CM

## 2023-03-02 DIAGNOSIS — Z5321 Procedure and treatment not carried out due to patient leaving prior to being seen by health care provider: Secondary | ICD-10-CM | POA: Insufficient documentation

## 2023-03-02 DIAGNOSIS — F331 Major depressive disorder, recurrent, moderate: Secondary | ICD-10-CM

## 2023-03-02 DIAGNOSIS — G35 Multiple sclerosis: Secondary | ICD-10-CM

## 2023-03-02 DIAGNOSIS — D509 Iron deficiency anemia, unspecified: Secondary | ICD-10-CM

## 2023-03-02 LAB — URINALYSIS, ROUTINE W REFLEX MICROSCOPIC
Bilirubin Urine: NEGATIVE
Glucose, UA: NEGATIVE mg/dL
Hgb urine dipstick: NEGATIVE
Ketones, ur: NEGATIVE mg/dL
Leukocytes,Ua: NEGATIVE
Nitrite: NEGATIVE
Protein, ur: NEGATIVE mg/dL
Specific Gravity, Urine: 1.009 (ref 1.005–1.030)
pH: 7 (ref 5.0–8.0)

## 2023-03-02 MED ORDER — METOCLOPRAMIDE HCL 10 MG PO TABS: 10.0000 mg | ORAL_TABLET | Freq: Three times a day (TID) | ORAL | 2 refills | Status: DC | PRN

## 2023-03-02 NOTE — MAU Note (Addendum)
..  Candice [name] is a 37 y.o. at [redacted]w[redacted]d here in MAU reporting: elevated blood pressure in the office of 140's. Is now since 5pm having a headache, visual changes (floaters and white specs)  Was having abdominal pain but it has improved sitting in the lobby. It was cramping and tightening that is coming every 15-20 minutes. Denies vaginal bleeding or leaking of fluid. +FM Has not taken any medication for pain.   Pain score: headache 8/10 Vitals:   03/02/23 2053  BP: 133/71  Pulse: 96  Resp: 17  Temp: 98.1 F (36.7 C)  SpO2: 98%     FHT:136 Lab orders placed from triage:  UA

## 2023-03-02 NOTE — MAU Note (Signed)
Registration called and stated patient was leaving AMA.  Per Willette Cluster, NT

## 2023-03-02 NOTE — Progress Notes (Signed)
PRENATAL VISIT NOTE  Subjective:  Candice [name] is a 37 y.o. G3P2002 at [redacted]w[redacted]d being seen today for ongoing prenatal care.  She is currently monitored for the following issues for this high-risk pregnancy and has OBESITY, NOS; Genital herpes; Major depressive disorder, recurrent episode, moderate (HCC); HIV-1 (human immunodeficiency virus I) (HCC); MS (multiple sclerosis) (HCC); Depression, major, single episode, mild (HCC); Supervision of high risk pregnancy, antepartum; Alpha thalassemia silent carrier; Pregnancy with history of cesarean section, antepartum; Iron deficiency anemia during pregnancy; and GDM (gestational diabetes mellitus) on their problem list.  Patient reports  nausea making it difficult to eat regularly, she is not eating much and going several hours without food, blood sugars are sometimes low and sometimes high, especially fasting values .  Contractions: Irregular. Vag. Bleeding: None.  Movement: Present. Denies leaking of fluid.   The following portions of the patient's history were reviewed and updated as appropriate: allergies, current medications, past family history, past medical history, past social history, past surgical history and problem list.   Objective:   Vitals:   03/02/23 0829 03/02/23 0848  BP: (!) 143/83 (!) 144/85  Pulse: (!) 102 95  Weight: 231 lb (104.8 kg)     Fetal Status:     Movement: Present     General:  Alert, oriented and cooperative. Patient is in no acute distress.  Skin: Skin is warm and dry. No rash noted.   Cardiovascular: Normal heart rate noted  Respiratory: Normal respiratory effort, no problems with respiration noted  Abdomen: Soft, gravid, appropriate for gestational age.  Pain/Pressure: Present     Pelvic: Cervical exam deferred        Extremities: Normal range of motion.  Edema: Mild pitting, slight indentation  Mental Status: Normal mood and affect. Normal behavior. Normal judgment and thought content.    Assessment and Plan:  Pregnancy: G3P2002 at [redacted]w[redacted]d  1. Supervision of high risk pregnancy, antepartum --Anticipatory guidance about next visits/weeks of pregnancy given.   2. Diet controlled gestational diabetes mellitus (GDM) in third trimester --Reviewed glucose log --Fasting values (6-7 total) are all above 95, highest 120s, PP after lunch values 1 out of 8 out of range and several in the 80s but all after dinner values (6-7 total) elevated, highest 178 --Discussed recommendation to start medication, but pt not eating well, given nausea and low appetite, skipping meals, not tolerating protein, etc --Rx for Reglan sent to pharmacy, pt to use in addition to occasional use of Zofran to tolerate more PO foods, especially high protein foods --Pt to try liquid calories with protein shakes (look for lower carb options), especially for breakfast --F/U in 2 weeks for glucose review, but pt to notify office if values remain grossly abnormal   3. Iron deficiency anemia during pregnancy --Discussed oral iron, pt has not started taking --Take every Other day --Reviewed iron rich foods  4. MS (multiple sclerosis) (HCC)   5. HIV-1 (human immunodeficiency virus I) (HCC) --Viral load undetected, continue f/u with ID  6. Major depressive disorder, recurrent episode, moderate (HCC) --Doing well, stable at this time. Reports life stress, but not depression currently.  7. Elevated blood pressure reading without diagnosis of hypertension --BP elevated, no hx prior to or during pregnancy --Reviewed s/sx of PEC/reasons to seek care --PEC labs today, repeat BP in office in 2-3 days --Pt to obtain home BP cuff if possible and enter into [employer]   Preterm labor symptoms and general obstetric precautions including but not limited  to vaginal bleeding, contractions, leaking of fluid and fetal movement were reviewed in detail with the patient. Please refer to After Visit Summary for other counseling  recommendations.   Return in about 2 weeks (around 03/16/2023) for [facility]OB.  Future Appointments  Date Time Provider Department Center  03/04/2023  2:30 PM [facility] NURSE [facility]  03/13/2023  9:45 AM Jennette Kettle, RPH-CPP [facility]  03/17/2023 10:15 AM Venora Maples, MD [facility]  03/20/2023  8:45 AM Richardean Sale, DO [facility] None  03/23/2023  9:15 AM [facility] NURSE [facility]  03/23/2023  9:30 AM [facility] US2 [facility]  03/31/2023  2:15 PM [facility] NURSE [facility]  03/31/2023  2:30 PM [facility] US3 [facility]  04/07/2023 10:15 AM [facility] NURSE [facility]  04/07/2023 10:30 AM [facility] US2 [facility]  04/10/2023 10:45 AM Jennette Kettle, RPH-CPP [facility]  04/14/2023 10:15 AM [facility] NURSE [facility]  04/14/2023 10:30 AM [facility] US2 [facility]  04/21/2023 12:15 PM [facility] NURSE [facility]  04/21/2023 12:30 PM [facility] US3 [facility]  05/11/2023  9:30 AM Kuppelweiser, Cassie L, RPH-CPP [facility]  07/15/2023 10:50 AM Drema Dallas, DO [facility] None    Sharen Counter, CNM

## 2023-03-03 ENCOUNTER — Other Ambulatory Visit: Payer: Self-pay | Admitting: *Deleted

## 2023-03-03 DIAGNOSIS — O24419 Gestational diabetes mellitus in pregnancy, unspecified control: Secondary | ICD-10-CM

## 2023-03-03 DIAGNOSIS — O99213 Obesity complicating pregnancy, third trimester: Secondary | ICD-10-CM

## 2023-03-03 DIAGNOSIS — O09523 Supervision of elderly multigravida, third trimester: Secondary | ICD-10-CM

## 2023-03-03 LAB — CBC
Hematocrit: 34.7 % (ref 34.0–46.6)
Hemoglobin: 10.7 g/dL — ABNORMAL LOW (ref 11.1–15.9)
MCH: 27 pg (ref 26.6–33.0)
MCHC: 30.8 g/dL — ABNORMAL LOW (ref 31.5–35.7)
MCV: 88 fL (ref 79–97)
Platelets: 387 10*3/uL (ref 150–450)
RBC: 3.96 x10E6/uL (ref 3.77–5.28)
RDW: 15.8 % — ABNORMAL HIGH (ref 11.7–15.4)
WBC: 12.6 10*3/uL — ABNORMAL HIGH (ref 3.4–10.8)

## 2023-03-03 LAB — COMPREHENSIVE METABOLIC PANEL
ALT: 16 IU/L (ref 0–32)
AST: 15 IU/L (ref 0–40)
Albumin: 3.4 g/dL — ABNORMAL LOW (ref 3.9–4.9)
Alkaline Phosphatase: 70 IU/L (ref 44–121)
BUN/Creatinine Ratio: 10 (ref 9–23)
BUN: 4 mg/dL — ABNORMAL LOW (ref 6–20)
Bilirubin Total: <0.2 mg/dL (ref 0.0–1.2)
CO2: 18 mmol/L — ABNORMAL LOW (ref 20–29)
Calcium: 8.9 mg/dL (ref 8.7–10.2)
Chloride: 105 mmol/L (ref 96–106)
Creatinine, Ser: 0.42 mg/dL — ABNORMAL LOW (ref 0.57–1.00)
Globulin, Total: 2.7 g/dL (ref 1.5–4.5)
Glucose: 108 mg/dL — ABNORMAL HIGH (ref 70–99)
Potassium: 3.7 mmol/L (ref 3.5–5.2)
Sodium: 138 mmol/L (ref 134–144)
Total Protein: 6.1 g/dL (ref 6.0–8.5)
eGFR: 128 mL/min/{1.73_m2} (ref >59)

## 2023-03-04 ENCOUNTER — Other Ambulatory Visit: Payer: Self-pay

## 2023-03-04 ENCOUNTER — Encounter: Payer: Self-pay | Admitting: General Practice

## 2023-03-04 ENCOUNTER — Ambulatory Visit (INDEPENDENT_AMBULATORY_CARE_PROVIDER_SITE_OTHER): Payer: BC Managed Care – PPO

## 2023-03-04 VITALS — BP 129/78 | HR 105 | Ht 62.0 in | Wt 233.0 lb

## 2023-03-04 DIAGNOSIS — Z3A29 29 weeks gestation of pregnancy: Secondary | ICD-10-CM

## 2023-03-04 DIAGNOSIS — O0990 Supervision of high risk pregnancy, unspecified, unspecified trimester: Secondary | ICD-10-CM

## 2023-03-04 LAB — PROTEIN / CREATININE RATIO, URINE
Creatinine, Urine: 96.9 mg/dL
Protein, Ur: 35.3 mg/dL
Protein/Creat Ratio: 364 mg/g{creat} — ABNORMAL HIGH (ref 0–200)

## 2023-03-04 NOTE — Progress Notes (Cosign Needed Addendum)
Patient presents to office today for BP check following elevated reading at prenatal visit on 9/9. She reports a headache every afternoon with seeing floaters- unrelieved with Tylenol or rest. States this has been happening for a couple weeks ago- does report significant stress in the past couple of weeks. +1 edema noted in feet/ankles today. FHR 145bpm. She also has a lot of hip/pelvic pain with occasional contractions- she takes 3 extra strength tylenol just to get through the workday. Advised patient to only take 2 extra strength tylenol at a time. Discussed magnesium at nighttime or flexeril. Patient states she has flexeril at home due to her MS so she will try that. Also recommended gentle stretching like yoga, warm heating pad or bath, or muscle rubs. Provided maternity support belt to patient and reviewed use. Discussed she could try Excedrin tension for her headaches but to be aware that it does contain additional tylenol. Patient will try those options to see if pain improves- she will reach out if headaches worsen.  Chase Caller RN BSN 03/04/23

## 2023-03-10 ENCOUNTER — Telehealth: Payer: Self-pay | Admitting: Pharmacy Technician

## 2023-03-10 NOTE — Telephone Encounter (Signed)
[name]  Patient's medication, Cabenuva 400-600, has been delivered to [facility] from [facility] and will be administered at pt's next visit  9/20.

## 2023-03-13 ENCOUNTER — Ambulatory Visit (INDEPENDENT_AMBULATORY_CARE_PROVIDER_SITE_OTHER): Payer: BC Managed Care – PPO | Admitting: Pharmacist

## 2023-03-13 ENCOUNTER — Other Ambulatory Visit: Payer: Self-pay

## 2023-03-13 DIAGNOSIS — B20 Human immunodeficiency virus [HIV] disease: Secondary | ICD-10-CM | POA: Diagnosis not present

## 2023-03-13 MED ORDER — CABOTEGRAVIR & RILPIVIRINE ER 400 & 600 MG/2ML IM SUER
1.0000 | Freq: Once | INTRAMUSCULAR | Status: AC
Start: 2023-03-13 — End: 2023-03-13
  Administered 2023-03-13: 1 via INTRAMUSCULAR

## 2023-03-13 NOTE — Progress Notes (Unsigned)
HPI: Candice [name] is a 37 y.o. female who presents to the [facility] pharmacy clinic for [place] administration.  Patient Active Problem List   Diagnosis Date Noted   Iron deficiency anemia during pregnancy 02/11/2023   GDM (gestational diabetes mellitus) 02/11/2023   Pregnancy with history of cesarean section, antepartum 01/21/2023   Alpha thalassemia silent carrier 11/28/2022   Supervision of high risk pregnancy, antepartum 11/10/2022   Depression, major, single episode, mild (HCC) 01/13/2022   MS (multiple sclerosis) (HCC) 05/21/2020   HIV-1 (human immunodeficiency virus I) (HCC) 09/07/2019   Major depressive disorder, recurrent episode, moderate (HCC) 06/08/2013   Genital herpes 02/16/2013   OBESITY, NOS 08/20/2006    Patient's Medications  New Prescriptions   No medications on file  Previous Medications   ACCU-CHEK SOFTCLIX LANCETS LANCETS    Use as instructed QID   ACETAMINOPHEN (TYLENOL) 500 MG TABLET    Take 500 mg by mouth every 6 (six) hours as needed for mild pain.   CABOTEGRAVIR & RILPIVIRINE ER (CABENUVA) 400 & 600 MG/2ML INJECTION    Inject 1 kit into the muscle every 30 (thirty) days.   CHOLECALCIFEROL (VITAMIN D3) 25 MCG (1000 UNIT) TABLET    Take 7 Units by mouth daily.   FERROUS SULFATE 324 MG TBEC    Take 1 tablet (324 mg total) by mouth every other day.   GLUCOSE BLOOD (CONTOUR TEST) TEST STRIP    Use as instructed   METOCLOPRAMIDE (REGLAN) 10 MG TABLET    Take 1 tablet (10 mg total) by mouth 3 (three) times daily with meals as needed for nausea.   PRENATAL VIT-FE FUMARATE-FA (M-NATAL PLUS) 27-1 MG TABS    Take 1 tablet by mouth daily.   VALACYCLOVIR (VALTREX) 1000 MG TABLET    Take 1,000 mg by mouth 2 (two) times daily.  Modified Medications   No medications on file  Discontinued Medications   No medications on file    Allergies: Allergies  Allergen Reactions   Contrast Media [Iodinated Contrast Media] Nausea And Vomiting    Past Medical  History: Past Medical History:  Diagnosis Date   Abnormal MRI, spine 04/06/2020   Arthralgia 02/18/2022   Back pain 10/13/2011   Complication of anesthesia    epidural with last pregnancy made entire left side numb, had to d/c   Depression    Frequent urination 09/11/2021   GDM (gestational diabetes mellitus) 02/11/2023   GERD (gastroesophageal reflux disease) 05/21/2020   Headache    HIV-1 (human immunodeficiency virus I) (HCC)    HSV 06/28/2007   Hx of migraines    Multiple sclerosis (HCC)    Right leg numbness 02/07/2020   Vitamin D deficiency 06/20/2022    Social History: Social History   Socioeconomic History   Marital status: Single    Spouse name: Not on file   Number of children: 2   Years of education: 14   Highest education level: Not on file  Occupational History    Comment: Associate Professor  Tobacco Use   Smoking status: Every Day    Current packs/day: 0.25    Types: Cigarettes    Passive exposure: Current   Smokeless tobacco: Never   Tobacco comments:    Trying to quit due to pregnancy   Vaping Use   Vaping status: Never Used  Substance and Sexual Activity   Alcohol use: Not Currently    Comment: occasional   Drug use: Not Currently    Types: Marijuana  Comment: patient reports last THC use beginning of April   Sexual activity: Not Currently    Partners: Male    Birth control/protection: None    Comment: Decline condoms  Other Topics Concern   Not on file  Social History Narrative   Works as Associate Professor.   FOB of both children lives in [place]; will be some what involved but mother is no longer dating him.   Social Determinants of Health   Financial Resource Strain: Not on file  Food Insecurity: Food Insecurity Present (02/23/2023)   Hunger Vital Sign    Worried About Running Out of Food in the Last Year: Sometimes true    Ran Out of Food in the Last Year: Sometimes true  Transportation Needs: Not on file  Physical Activity: Not on  file  Stress: Not on file  Social Connections: Not on file    Labs: Lab Results  Component Value Date   HIV1RNAQUANT Not Detected 02/13/2023   HIV1RNAQUANT Not Detected 01/16/2023   HIV1RNAQUANT Not Detected 12/12/2022   CD4TABS 684 09/11/2022   CD4TABS 1,026 11/22/2020   CD4TABS 1,078 03/29/2020    RPR and STI Lab Results  Component Value Date   LABRPR Non Reactive 02/06/2023   LABRPR Non Reactive 09/17/2022   LABRPR NON-REACTIVE 12/13/2021   LABRPR NON-REACTIVE 06/20/2021   LABRPR Non Reactive 10/25/2020    STI Results GC CT  09/17/2022 10:00 AM Negative  Negative   08/29/2022  3:43 PM Negative  Negative   02/14/2022  9:13 AM Negative    Negative  Negative    Negative   12/13/2021  9:09 AM Negative    Negative  Negative    Negative   07/29/2021  4:28 PM Negative  Negative   06/20/2021  9:20 AM Negative  Negative   12/21/2020  3:45 PM Negative  Negative   11/22/2020  9:58 AM Negative  Negative   10/25/2020  9:52 AM Negative  Negative   09/05/2019  4:31 PM Negative  Negative   10/16/2017 12:00 AM Negative  Negative   06/08/2017 12:00 AM Negative  Negative   10/14/2016 12:00 AM Negative  Negative   02/15/2016 12:00 AM Negative  Negative   07/13/2015 12:00 AM Negative  Negative   04/13/2015 12:00 AM Negative  Negative   09/24/2013 12:00 AM NG: Negative  CT: Negative   02/11/2011  3:36 PM  NEGATIVE   01/29/2010  7:00 PM  NEGATIVE   05/02/2009  8:17 PM  NEGATIVE     Hepatitis B Lab Results  Component Value Date   HEPBSAB REACTIVE (A) 09/08/2019   HEPBSAG Negative 09/17/2022   HEPBCAB NON-REACTIVE 09/08/2019   Hepatitis C Lab Results  Component Value Date   HEPCAB NON-REACTIVE 09/08/2019   Hepatitis A Lab Results  Component Value Date   HAV BORDERLINE (A) 09/08/2019   Lipids: Lab Results  Component Value Date   CHOL 183 11/22/2020   TRIG 80 11/22/2020   HDL 51 11/22/2020   CHOLHDL 3.6 11/22/2020   VLDL 10 02/14/2013   LDLCALC 115 (H) 11/22/2020     TARGET DATE:  The 25th of the month  Assessment: Candice [name] presents today for their maintenance Cabenuva injections. Initial/past injections were tolerated well without issues. No problems with systemic effects of injections.   Administered cabotegravir 400mg /18mL in left upper outer quadrant of the gluteal muscle. Administered rilpivirine 600 mg/8mL in the right upper outer quadrant of the gluteal muscle. Monitored patient for 10 minutes after injection. Injections were tolerated well  without issue. Patient will follow up in 2 months for next injection. Will check HIV RNA today.  Plan: - Cabenuva injections administered - Check HIV RNA - Next injections scheduled for 10/18 with me and 11/18 with Candice [name]  - Call with any issues or questions  Margarite Gouge, PharmD, CPP, BCIDP, AAHIVP Clinical Pharmacist Practitioner Infectious Diseases Clinical Pharmacist [facility]

## 2023-03-16 LAB — HIV-1 RNA QUANT-NO REFLEX-BLD
HIV 1 RNA Quant: NOT DETECTED Copies/mL
HIV-1 RNA Quant, Log: NOT DETECTED Log cps/mL

## 2023-03-17 ENCOUNTER — Other Ambulatory Visit: Payer: Self-pay

## 2023-03-17 ENCOUNTER — Encounter: Payer: Self-pay | Admitting: Family Medicine

## 2023-03-17 ENCOUNTER — Ambulatory Visit (INDEPENDENT_AMBULATORY_CARE_PROVIDER_SITE_OTHER): Payer: BC Managed Care – PPO | Admitting: Family Medicine

## 2023-03-17 VITALS — BP 121/83 | HR 99 | Wt 232.6 lb

## 2023-03-17 DIAGNOSIS — O0990 Supervision of high risk pregnancy, unspecified, unspecified trimester: Secondary | ICD-10-CM

## 2023-03-17 DIAGNOSIS — O0993 Supervision of high risk pregnancy, unspecified, third trimester: Secondary | ICD-10-CM

## 2023-03-17 DIAGNOSIS — F331 Major depressive disorder, recurrent, moderate: Secondary | ICD-10-CM

## 2023-03-17 DIAGNOSIS — O24415 Gestational diabetes mellitus in pregnancy, controlled by oral hypoglycemic drugs: Secondary | ICD-10-CM

## 2023-03-17 DIAGNOSIS — G35 Multiple sclerosis: Secondary | ICD-10-CM

## 2023-03-17 DIAGNOSIS — Z3A31 31 weeks gestation of pregnancy: Secondary | ICD-10-CM

## 2023-03-17 DIAGNOSIS — O34219 Maternal care for unspecified type scar from previous cesarean delivery: Secondary | ICD-10-CM

## 2023-03-17 DIAGNOSIS — R030 Elevated blood-pressure reading, without diagnosis of hypertension: Secondary | ICD-10-CM | POA: Insufficient documentation

## 2023-03-17 DIAGNOSIS — Z9889 Other specified postprocedural states: Secondary | ICD-10-CM | POA: Insufficient documentation

## 2023-03-17 DIAGNOSIS — B20 Human immunodeficiency virus [HIV] disease: Secondary | ICD-10-CM

## 2023-03-17 DIAGNOSIS — A6000 Herpesviral infection of urogenital system, unspecified: Secondary | ICD-10-CM

## 2023-03-17 HISTORY — DX: Other specified postprocedural states: Z98.890

## 2023-03-17 MED ORDER — POLYETHYLENE GLYCOL 3350 17 GM/SCOOP PO POWD: 17.0000 g | Freq: Every day | ORAL | 1 refills | Status: DC | PRN

## 2023-03-17 MED ORDER — METFORMIN HCL 1000 MG PO TABS: 1000.0000 mg | ORAL_TABLET | Freq: Two times a day (BID) | ORAL | 2 refills | Status: DC

## 2023-03-17 NOTE — Patient Instructions (Signed)

## 2023-03-17 NOTE — Progress Notes (Signed)
   Subjective:  Candice [name] is a 37 y.o. G3P2002 at [redacted]w[redacted]d being seen today for ongoing prenatal care.  She is currently monitored for the following issues for this high-risk pregnancy and has OBESITY, NOS; Genital herpes; Major depressive disorder, recurrent episode, moderate (HCC); HIV-1 (human immunodeficiency virus I) (HCC); MS (multiple sclerosis) (HCC); Depression, major, single episode, mild (HCC); Supervision of high risk pregnancy, antepartum; Alpha thalassemia silent carrier; Pregnancy with history of cesarean section, antepartum; Iron deficiency anemia during pregnancy; and GDM (gestational diabetes mellitus) on their problem list.  Patient reports no complaints.  Contractions: Irritability. Vag. Bleeding: None.  Movement: Present. Denies leaking of fluid.   The following portions of the patient's history were reviewed and updated as appropriate: allergies, current medications, past family history, past medical history, past social history, past surgical history and problem list. Problem list updated.  Objective:   Vitals:   03/17/23 1055  BP: 121/83  Pulse: 99  Weight: 232 lb 9.6 oz (105.5 kg)    Fetal Status: Fetal Heart Rate (bpm): 140   Movement: Present     General:  Alert, oriented and cooperative. Patient is in no acute distress.  Skin: Skin is warm and dry. No rash noted.   Cardiovascular: Normal heart rate noted  Respiratory: Normal respiratory effort, no problems with respiration noted  Abdomen: Soft, gravid, appropriate for gestational age. Pain/Pressure: Present     Pelvic: Vag. Bleeding: None     Cervical exam deferred        Extremities: Normal range of motion.     Mental Status: Normal mood and affect. Normal behavior. Normal judgment and thought content.   Urinalysis:        Assessment and Plan:  Pregnancy: G3P2002 at [redacted]w[redacted]d  1. Supervision of high risk pregnancy, antepartum BP and FHR normal  2. MS (multiple sclerosis) (HCC) Follows w [place]  [facility], no meds at present  3. HIV-1 (human immunodeficiency virus I) (HCC) Viral load undetectable throughout pregnancy, last done on 03/13/2023 On cabenuva injections q-month Following w MFM  4. Genital herpes simplex, unspecified site No recent outbreaks Start prophylaxis at next visit  5. Gestational diabetes mellitus (GDM) in third trimester controlled on oral hypoglycemic drug Poorly controlled, see photo of log Discussed initiation of metformin 1g BID Following w MFM Last growth [place] 02/24/23, EFW 85%, AC 85%, AFI 22 Antenatal testing to start next week Discussed earlier delivery if poor control continues  6. Major depressive disorder, recurrent episode, moderate (HCC) No meds, doing well  7. Pregnancy with history of cesarean section, antepartum Desires RCS. Had tubal with last surgery and then reversal.  Unsure if she wants more children, declines BTL. Has BCBS, consent not signed today for that reason.  Considering post placental IUD  Preterm labor symptoms and general obstetric precautions including but not limited to vaginal bleeding, contractions, leaking of fluid and fetal movement were reviewed in detail with the patient. Please refer to After Visit Summary for other counseling recommendations.  Return in 2 weeks (on 03/31/2023) for [facility], ob visit, needs MD.   Venora Maples, MD

## 2023-03-20 ENCOUNTER — Ambulatory Visit: Payer: BC Managed Care – PPO | Admitting: Sports Medicine

## 2023-03-23 ENCOUNTER — Ambulatory Visit: Payer: BC Managed Care – PPO | Attending: Obstetrics and Gynecology

## 2023-03-23 ENCOUNTER — Ambulatory Visit: Payer: BC Managed Care – PPO | Admitting: *Deleted

## 2023-03-23 VITALS — BP 132/73 | HR 91

## 2023-03-23 DIAGNOSIS — O09523 Supervision of elderly multigravida, third trimester: Secondary | ICD-10-CM | POA: Diagnosis not present

## 2023-03-23 DIAGNOSIS — Z3A32 32 weeks gestation of pregnancy: Secondary | ICD-10-CM

## 2023-03-23 DIAGNOSIS — O0990 Supervision of high risk pregnancy, unspecified, unspecified trimester: Secondary | ICD-10-CM

## 2023-03-23 DIAGNOSIS — G35 Multiple sclerosis: Secondary | ICD-10-CM | POA: Diagnosis not present

## 2023-03-23 DIAGNOSIS — O24415 Gestational diabetes mellitus in pregnancy, controlled by oral hypoglycemic drugs: Secondary | ICD-10-CM | POA: Diagnosis not present

## 2023-03-23 DIAGNOSIS — O99213 Obesity complicating pregnancy, third trimester: Secondary | ICD-10-CM | POA: Insufficient documentation

## 2023-03-23 DIAGNOSIS — E669 Obesity, unspecified: Secondary | ICD-10-CM

## 2023-03-23 DIAGNOSIS — D563 Thalassemia minor: Secondary | ICD-10-CM

## 2023-03-23 DIAGNOSIS — O24419 Gestational diabetes mellitus in pregnancy, unspecified control: Secondary | ICD-10-CM | POA: Diagnosis not present

## 2023-03-23 DIAGNOSIS — O34219 Maternal care for unspecified type scar from previous cesarean delivery: Secondary | ICD-10-CM

## 2023-03-23 DIAGNOSIS — O99353 Diseases of the nervous system complicating pregnancy, third trimester: Secondary | ICD-10-CM

## 2023-03-23 DIAGNOSIS — O285 Abnormal chromosomal and genetic finding on antenatal screening of mother: Secondary | ICD-10-CM

## 2023-03-23 DIAGNOSIS — O98713 Human immunodeficiency virus [HIV] disease complicating pregnancy, third trimester: Secondary | ICD-10-CM

## 2023-03-23 DIAGNOSIS — B20 Human immunodeficiency virus [HIV] disease: Secondary | ICD-10-CM

## 2023-03-26 ENCOUNTER — Other Ambulatory Visit: Payer: Self-pay

## 2023-03-26 ENCOUNTER — Other Ambulatory Visit: Payer: Self-pay | Admitting: Obstetrics & Gynecology

## 2023-03-26 DIAGNOSIS — O34219 Maternal care for unspecified type scar from previous cesarean delivery: Secondary | ICD-10-CM

## 2023-03-26 NOTE — Progress Notes (Signed)
Orders for cesarean section 

## 2023-03-26 NOTE — Progress Notes (Signed)
Specialty Pharmacy Refill Coordination Note  Candice [name] is a 37 y.o. female assessed today regarding refills of clinic administered specialty medication(s) Cabotegravir & Rilpivirine   Clinic requested Courier to Provider Office   Delivery date: 04/07/23   Verified address: [facility] [address] [place] [address]   Medication will be filled on 04/06/23.

## 2023-03-31 ENCOUNTER — Other Ambulatory Visit: Payer: Self-pay

## 2023-03-31 ENCOUNTER — Ambulatory Visit: Payer: BC Managed Care – PPO | Admitting: *Deleted

## 2023-03-31 ENCOUNTER — Ambulatory Visit: Payer: BC Managed Care – PPO | Attending: Obstetrics and Gynecology

## 2023-03-31 ENCOUNTER — Encounter: Payer: Self-pay | Admitting: *Deleted

## 2023-03-31 ENCOUNTER — Ambulatory Visit (INDEPENDENT_AMBULATORY_CARE_PROVIDER_SITE_OTHER): Payer: BC Managed Care – PPO | Admitting: Family Medicine

## 2023-03-31 VITALS — BP 135/83 | HR 104

## 2023-03-31 VITALS — BP 135/83 | HR 104 | Wt 236.0 lb

## 2023-03-31 DIAGNOSIS — O99353 Diseases of the nervous system complicating pregnancy, third trimester: Secondary | ICD-10-CM

## 2023-03-31 DIAGNOSIS — O24415 Gestational diabetes mellitus in pregnancy, controlled by oral hypoglycemic drugs: Secondary | ICD-10-CM | POA: Diagnosis not present

## 2023-03-31 DIAGNOSIS — D563 Thalassemia minor: Secondary | ICD-10-CM | POA: Diagnosis not present

## 2023-03-31 DIAGNOSIS — O99333 Smoking (tobacco) complicating pregnancy, third trimester: Secondary | ICD-10-CM | POA: Insufficient documentation

## 2023-03-31 DIAGNOSIS — G35 Multiple sclerosis: Secondary | ICD-10-CM

## 2023-03-31 DIAGNOSIS — O0990 Supervision of high risk pregnancy, unspecified, unspecified trimester: Secondary | ICD-10-CM

## 2023-03-31 DIAGNOSIS — O99013 Anemia complicating pregnancy, third trimester: Secondary | ICD-10-CM

## 2023-03-31 DIAGNOSIS — A6000 Herpesviral infection of urogenital system, unspecified: Secondary | ICD-10-CM

## 2023-03-31 DIAGNOSIS — O34219 Maternal care for unspecified type scar from previous cesarean delivery: Secondary | ICD-10-CM

## 2023-03-31 DIAGNOSIS — Z9889 Other specified postprocedural states: Secondary | ICD-10-CM

## 2023-03-31 DIAGNOSIS — O99213 Obesity complicating pregnancy, third trimester: Secondary | ICD-10-CM | POA: Insufficient documentation

## 2023-03-31 DIAGNOSIS — O3410 Maternal care for benign tumor of corpus uteri, unspecified trimester: Secondary | ICD-10-CM

## 2023-03-31 DIAGNOSIS — Z3A33 33 weeks gestation of pregnancy: Secondary | ICD-10-CM

## 2023-03-31 DIAGNOSIS — D509 Iron deficiency anemia, unspecified: Secondary | ICD-10-CM

## 2023-03-31 DIAGNOSIS — E669 Obesity, unspecified: Secondary | ICD-10-CM

## 2023-03-31 DIAGNOSIS — Z21 Asymptomatic human immunodeficiency virus [HIV] infection status: Secondary | ICD-10-CM

## 2023-03-31 DIAGNOSIS — O24419 Gestational diabetes mellitus in pregnancy, unspecified control: Secondary | ICD-10-CM | POA: Insufficient documentation

## 2023-03-31 DIAGNOSIS — O09523 Supervision of elderly multigravida, third trimester: Secondary | ICD-10-CM | POA: Insufficient documentation

## 2023-03-31 DIAGNOSIS — O24410 Gestational diabetes mellitus in pregnancy, diet controlled: Secondary | ICD-10-CM

## 2023-03-31 DIAGNOSIS — O0993 Supervision of high risk pregnancy, unspecified, third trimester: Secondary | ICD-10-CM

## 2023-03-31 DIAGNOSIS — D259 Leiomyoma of uterus, unspecified: Secondary | ICD-10-CM

## 2023-03-31 MED ORDER — INSULIN GLARGINE 100 UNIT/ML SOLOSTAR PEN
10.0000 [IU] | PEN_INJECTOR | Freq: Every day | SUBCUTANEOUS | 11 refills | Status: DC
Start: 2023-03-31 — End: 2023-04-14

## 2023-03-31 MED ORDER — ACCU-CHEK SOFTCLIX LANCETS MISC
Freq: Four times a day (QID) | 12 refills | Status: DC
Start: 2023-03-31 — End: 2023-04-28
  Filled 2023-03-31: qty 100, fill #0

## 2023-03-31 MED ORDER — INSULIN PEN NEEDLE 32G X 4 MM MISC
5 refills | Status: DC
  Filled 2023-03-31: qty 100, 90d supply, fill #0

## 2023-03-31 MED ORDER — CONTOUR TEST VI STRP
ORAL_STRIP | 12 refills | Status: DC
  Filled 2023-03-31: qty 100, fill #0
  Filled 2023-04-02: qty 100, 25d supply, fill #0

## 2023-03-31 NOTE — Progress Notes (Unsigned)
Subjective:  Candice [name] is a 37 y.o. G3P2002 at [redacted]w[redacted]d being seen today for ongoing prenatal care.  She is currently monitored for the following issues for this high-risk pregnancy and has OBESITY, NOS; Genital herpes; Major depressive disorder, recurrent episode, moderate (HCC); HIV-1 (human immunodeficiency virus I) (HCC); MS (multiple sclerosis) (HCC); Depression, major, single episode, mild (HCC); Supervision of high risk pregnancy, antepartum; Alpha thalassemia silent carrier; Pregnancy with history of cesarean section, antepartum; Iron deficiency anemia during pregnancy; GDM (gestational diabetes mellitus); Elevated blood pressure reading in office without diagnosis of hypertension; History of reversal of tubal ligation; and Tobacco use affecting pregnancy in third trimester, antepartum on their problem list.  Patient reports no complaints.  Contractions: Not present. Vag. Bleeding: None.  Movement: Present. Denies leaking of fluid.   The following portions of the patient's history were reviewed and updated as appropriate: allergies, current medications, past family history, past medical history, past social history, past surgical history and problem list. Problem list updated.  Objective:   Vitals:   03/31/23 1551  BP: 135/83  Pulse: (!) 104  Weight: 236 lb (107 kg)    Fetal Status: Fetal Heart Rate (bpm): 131   Movement: Present     General:  Alert, oriented and cooperative. Patient is in no acute distress.  Skin: Skin is warm and dry. No rash noted.   Cardiovascular: Normal heart rate noted  Respiratory: Normal respiratory effort, no problems with respiration noted  Abdomen: Soft, gravid, appropriate for gestational age. Pain/Pressure: Absent     Pelvic: Vag. Bleeding: None     Cervical exam deferred        Extremities: Normal range of motion.  Edema: Trace  Mental Status: Normal mood and affect. Normal behavior. Normal judgment and thought content.   Urinalysis:         Assessment and Plan:  Pregnancy: G3P2002 at [redacted]w[redacted]d  1. Diet controlled gestational diabetes mellitus (GDM) in third trimester See photo of log, generally uncontrolled though not horrendous fasting sugars. Post prandials are all over and likely driven mostly by dietary indiscretion Patient reports she discontinued metformin due to GI side effects She is willing to start insulin, will initiate lantus 10u at bedtime Discussed may need to move up delivery if sugars are not well controlled - insulin glargine (LANTUS) 100 UNIT/ML Solostar Pen; Inject 10 Units into the skin at bedtime.  Dispense: 9 mL; Refill: 11 - Insulin Pen Needle 32G X 4 MM MISC; USE AS DIRECTED WITH INSULIN  Dispense: 100 each; Refill: 5 - Accu-Chek Softclix Lancets lancets; USE AS DIRECTED TO TEST BLOOD SUGARS 4 (FOUR) TIMES A DAY  Dispense: 100 each; Refill: 12 - glucose blood (CONTOUR TEST) test strip; Use as instructed  Dispense: 100 each; Refill: 12  2. Supervision of high risk pregnancy, antepartum BP and FHR normal Still considering IUD, address again at next visit  3. Pregnancy with history of cesarean section, antepartum Scheduled for RCS w Dr. Debroah Loop on 05/09/23 Discussed may need to move date up if sugars are not better controlled  4. MS (multiple sclerosis) (HCC) Follows w [place], no meds at present  5. Iron deficiency anemia during pregnancy Lab Results  Component Value Date   HGB 10.7 (L) 03/02/2023  resolved  6. HIV-1 (human immunodeficiency virus I) (HCC) Viral load undetectable throughout pregnancy, last done on 03/13/2023 On cabenuva injections q-month Following w MFM  7. History of reversal of tubal ligation   8. Genital herpes simplex, unspecified site Hx of,  instructed to start valtrex at the end of the week when she is 34 weeks  Preterm labor symptoms and general obstetric precautions including but not limited to vaginal bleeding, contractions, leaking of fluid and fetal movement  were reviewed in detail with the patient. Please refer to After Visit Summary for other counseling recommendations.  Return in 2 weeks (on 04/14/2023) for [facility], ob visit, needs MD.   Venora Maples, MD

## 2023-04-01 ENCOUNTER — Other Ambulatory Visit: Payer: Self-pay

## 2023-04-01 NOTE — Patient Instructions (Signed)

## 2023-04-02 ENCOUNTER — Encounter: Payer: BC Managed Care – PPO | Attending: Obstetrics and Gynecology | Admitting: Dietician

## 2023-04-02 ENCOUNTER — Other Ambulatory Visit: Payer: Self-pay

## 2023-04-02 ENCOUNTER — Ambulatory Visit (INDEPENDENT_AMBULATORY_CARE_PROVIDER_SITE_OTHER): Payer: BC Managed Care – PPO | Admitting: Dietician

## 2023-04-02 DIAGNOSIS — O24419 Gestational diabetes mellitus in pregnancy, unspecified control: Secondary | ICD-10-CM | POA: Diagnosis not present

## 2023-04-02 DIAGNOSIS — Z3A33 33 weeks gestation of pregnancy: Secondary | ICD-10-CM

## 2023-04-02 NOTE — Progress Notes (Signed)
Patient was seen for Gestational Diabetes self-management on 04/02/2023  Start time 0905 and End time 1010   Estimated due date: 05/16/2023; [redacted]w[redacted]d  Clinical: Medical History: Past Medical History:  Diagnosis Date   Abnormal MRI, spine 04/06/2020   Arthralgia 02/18/2022   Back pain 10/13/2011   Complication of anesthesia    epidural with last pregnancy made entire left side numb, had to d/c   Depression    Frequent urination 09/11/2021   GDM (gestational diabetes mellitus) 02/11/2023   GERD (gastroesophageal reflux disease) 05/21/2020   Headache    HIV-1 (human immunodeficiency virus I) (HCC)    HSV 06/28/2007   Hx of migraines    Multiple sclerosis (HCC)    Right leg numbness 02/07/2020   Vitamin D deficiency 06/20/2022     Medications:   Current Outpatient Medications:    acetaminophen (TYLENOL) 500 MG tablet, Take 500 mg by mouth every 6 (six) hours as needed for mild pain., Disp: , Rfl:    cabotegravir & rilpivirine ER (CABENUVA) 400 & 600 MG/2ML injection, Inject 1 kit into the muscle every 30 (thirty) days., Disp: 4 mL, Rfl: 5   cholecalciferol (VITAMIN D3) 25 MCG (1000 UNIT) tablet, Take 7 Units by mouth daily., Disp: , Rfl:    Prenatal Vit-Fe Fumarate-FA (M-NATAL PLUS) 27-1 MG TABS, Take 1 tablet by mouth daily., Disp: 30 tablet, Rfl: 11   Accu-Chek Softclix Lancets lancets, USE AS DIRECTED TO TEST BLOOD SUGARS 4 (FOUR) TIMES A DAY, Disp: 100 each, Rfl: 12   ferrous sulfate 324 MG TBEC, Take 1 tablet (324 mg total) by mouth every other day. (Patient not taking: Reported on 04/02/2023), Disp: 40 tablet, Rfl: 1   glucose blood (CONTOUR TEST) test strip, Use as instructed, Disp: 100 each, Rfl: 12   insulin glargine (LANTUS) 100 UNIT/ML Solostar Pen, Inject 10 Units into the skin at bedtime., Disp: 9 mL, Rfl: 11   Insulin Pen Needle 32G X 4 MM MISC, USE AS DIRECTED WITH INSULIN, Disp: 100 each, Rfl: 5   polyethylene glycol powder (GLYCOLAX/MIRALAX) 17 GM/SCOOP powder, Take 17  g by mouth daily as needed. (Patient not taking: Reported on 04/02/2023), Disp: 510 g, Rfl: 1   valACYclovir (VALTREX) 1000 MG tablet, Take 1,000 mg by mouth 2 (two) times daily., Disp: , Rfl:   Current Facility-Administered Medications:    cabotegravir & rilpivirine ER (CABENUVA) 400 & 600 MG/2ML injection 1 kit, 1 kit, Intramuscular, Once, Veryl Speak, FNP   Labs: Last vitamin D Lab Results  Component Value Date   VD25OH 26.97 (L) 02/02/2023    No results found for: "IRON", "TIBC", "FERRITIN"  Lab Results  Component Value Date   HGBA1C 5.9 (H) 11/10/2022   Dietary and Lifestyle History: Pt present today with Accu Chek guide me and prodigy strips. Pt reports she has been unable to obtain strips or monitoring blood sugar since 03/29/2023. Pt called the community pharmacy and strips and insulin pen needles ready to pick up today. Pt reports intake of 3 teaspoons daily of corn starch and denies iron supplementations as prescribed. RD encouraged to administer iron and other medications as prescribed.   Explained to patient that patient has been prescribed long acting (brand names: Lantus).  Long acting insulin acts over 24 hours and is dosed once daily (typically at night).  Reviewed with patient how to administer an insulin injection and that pen needles are disposed of in a sharps container. Explained that injection sites include abdomen, outer thighs, back of  arms, lower back/upper buttocks. Advised insulin injections must be rotated to prevent insulin mediated lipohypertrophy. Pt demonstrated her ability to perform a 10 units mock injection during today's encounter.  All Pt's questions were answered during this encounter.r   Physical Activity: walking/on feet at work Stress: some related to finances Sleep: sleep is ok; Pt c/o nocturnal polyuria  24 hr Recall:  First Meal: skips or 1-2 week sausage biscuit and bo rounds, orange juice or sweet tea Snack: none Second meal: skips 1-2  weekly or subway 6 inch sub with veggies, sometime chips Snack: Third meal: brown rice, black beans, steak, chicken, lettuce, salsa, avocado, sour cream, cheese, sprite Snack: water Beverages: lemonade, orange juice, sweet tea, water  NUTRITION INTERVENTION  Nutrition education (E-1) on the following topics:   Initial Follow-up  []  []  Definition of Gestational Diabetes []  [x]  Why dietary management is important in controlling blood glucose []  [x]  Effects each nutrient has on blood glucose levels []  [x]  Simple carbohydrates vs complex carbohydrates []  [x]  Fluid intake []  [x]  Creating a balanced meal plan []  [x]  Carbohydrate counting  []  [x]  When to check blood glucose levels []  []  Proper blood glucose monitoring techniques []  [x]  Effect of stress and stress reduction techniques  []  [x]  Exercise effect on blood glucose levels, appropriate exercise during pregnancy []  [x]  Importance of limiting caffeine and abstaining from alcohol and smoking []  [x]  Medications used for blood sugar control during pregnancy []  [x]  Hypoglycemia and rule of 15 []  []  Postpartum self care   Patient has an Accu Chek meter prior to visit. Patient is testing pre breakfast and 2 hours after each meal. Patient instructed to monitor glucose levels: FBS: 60 - <= 95 mg/dL; 2 hour: <= 161 mg/dL  Patient received handouts:  Carbohydrate Counting List Blood glucose log Quick reference guide Lantus Hypoglycemia Plate planner  Patient will be seen for follow-up as needed.

## 2023-04-06 NOTE — Progress Notes (Addendum)
HPI: Candice [name] is a 37 y.o. female who presents to the [facility] pharmacy clinic for [place] administration.  Patient Active Problem List   Diagnosis Date Noted   Tobacco use affecting pregnancy in third trimester, antepartum 03/31/2023   Elevated blood pressure reading in office without diagnosis of hypertension 03/17/2023   History of reversal of tubal ligation 03/17/2023   Iron deficiency anemia during pregnancy 02/11/2023   GDM (gestational diabetes mellitus) 02/11/2023   Pregnancy with history of cesarean section, antepartum 01/21/2023   Alpha thalassemia silent carrier 11/28/2022   Supervision of high risk pregnancy, antepartum 11/10/2022   Depression, major, single episode, mild (HCC) 01/13/2022   MS (multiple sclerosis) (HCC) 05/21/2020   HIV-1 (human immunodeficiency virus I) (HCC) 09/07/2019   Major depressive disorder, recurrent episode, moderate (HCC) 06/08/2013   Genital herpes 02/16/2013   OBESITY, NOS 08/20/2006    Patient's Medications  New Prescriptions   No medications on file  Previous Medications   ACCU-CHEK SOFTCLIX LANCETS LANCETS    USE AS DIRECTED TO TEST BLOOD SUGARS 4 (FOUR) TIMES A DAY   ACETAMINOPHEN (TYLENOL) 500 MG TABLET    Take 500 mg by mouth every 6 (six) hours as needed for mild pain.   CABOTEGRAVIR & RILPIVIRINE ER (CABENUVA) 400 & 600 MG/2ML INJECTION    Inject 1 kit into the muscle every 30 (thirty) days.   CHOLECALCIFEROL (VITAMIN D3) 25 MCG (1000 UNIT) TABLET    Take 7 Units by mouth daily.   FERROUS SULFATE 324 MG TBEC    Take 1 tablet (324 mg total) by mouth every other day.   GLUCOSE BLOOD (CONTOUR TEST) TEST STRIP    Use as instructed   INSULIN GLARGINE (LANTUS) 100 UNIT/ML SOLOSTAR PEN    Inject 10 Units into the skin at bedtime.   INSULIN PEN NEEDLE 32G X 4 MM MISC    USE AS DIRECTED WITH INSULIN   POLYETHYLENE GLYCOL POWDER (GLYCOLAX/MIRALAX) 17 GM/SCOOP POWDER    Take 17 g by mouth daily as needed.   PRENATAL VIT-FE FUMARATE-FA  (M-NATAL PLUS) 27-1 MG TABS    Take 1 tablet by mouth daily.   VALACYCLOVIR (VALTREX) 1000 MG TABLET    Take 1,000 mg by mouth 2 (two) times daily.  Modified Medications   No medications on file  Discontinued Medications   No medications on file    Allergies: Allergies  Allergen Reactions   Contrast Media [Iodinated Contrast Media] Nausea And Vomiting    Past Medical History: Past Medical History:  Diagnosis Date   Abnormal MRI, spine 04/06/2020   Arthralgia 02/18/2022   Back pain 10/13/2011   Complication of anesthesia    epidural with last pregnancy made entire left side numb, had to d/c   Depression    Frequent urination 09/11/2021   GDM (gestational diabetes mellitus) 02/11/2023   GERD (gastroesophageal reflux disease) 05/21/2020   Headache    HIV-1 (human immunodeficiency virus I) (HCC)    HSV 06/28/2007   Hx of migraines    Multiple sclerosis (HCC)    Right leg numbness 02/07/2020   Vitamin D deficiency 06/20/2022    Social History: Social History   Socioeconomic History   Marital status: Single    Spouse name: Not on file   Number of children: 2   Years of education: 14   Highest education level: Not on file  Occupational History    Comment: Associate Professor  Tobacco Use   Smoking status: Every Day    Current  packs/day: 0.25    Types: Cigarettes    Passive exposure: Current   Smokeless tobacco: Never   Tobacco comments:    Trying to quit due to pregnancy   Vaping Use   Vaping status: Never Used  Substance and Sexual Activity   Alcohol use: Not Currently    Comment: occasional   Drug use: Not Currently    Types: Marijuana    Comment: patient reports last THC use beginning of April   Sexual activity: Not Currently    Partners: Male    Birth control/protection: None    Comment: Decline condoms  Other Topics Concern   Not on file  Social History Narrative   Works as Associate Professor.   FOB of both children lives in [place]; will be some what  involved but mother is no longer dating him.   Social Determinants of Health   Financial Resource Strain: Not on file  Food Insecurity: Food Insecurity Present (02/23/2023)   Hunger Vital Sign    Worried About Running Out of Food in the Last Year: Sometimes true    Ran Out of Food in the Last Year: Sometimes true  Transportation Needs: Not on file  Physical Activity: Not on file  Stress: Not on file  Social Connections: Not on file    Labs: Lab Results  Component Value Date   HIV1RNAQUANT Not Detected 03/13/2023   HIV1RNAQUANT Not Detected 02/13/2023   HIV1RNAQUANT Not Detected 01/16/2023   CD4TABS 684 09/11/2022   CD4TABS 1,026 11/22/2020   CD4TABS 1,078 03/29/2020    RPR and STI Lab Results  Component Value Date   LABRPR Non Reactive 02/06/2023   LABRPR Non Reactive 09/17/2022   LABRPR NON-REACTIVE 12/13/2021   LABRPR NON-REACTIVE 06/20/2021   LABRPR Non Reactive 10/25/2020    STI Results GC CT  09/17/2022 10:00 AM Negative  Negative   08/29/2022  3:43 PM Negative  Negative   02/14/2022  9:13 AM Negative    Negative  Negative    Negative   12/13/2021  9:09 AM Negative    Negative  Negative    Negative   07/29/2021  4:28 PM Negative  Negative   06/20/2021  9:20 AM Negative  Negative   12/21/2020  3:45 PM Negative  Negative   11/22/2020  9:58 AM Negative  Negative   10/25/2020  9:52 AM Negative  Negative   09/05/2019  4:31 PM Negative  Negative   10/16/2017 12:00 AM Negative  Negative   06/08/2017 12:00 AM Negative  Negative   10/14/2016 12:00 AM Negative  Negative   02/15/2016 12:00 AM Negative  Negative   07/13/2015 12:00 AM Negative  Negative   04/13/2015 12:00 AM Negative  Negative   09/24/2013 12:00 AM NG: Negative  CT: Negative   02/11/2011  3:36 PM  NEGATIVE   01/29/2010  7:00 PM  NEGATIVE   05/02/2009  8:17 PM  NEGATIVE     Hepatitis B Lab Results  Component Value Date   HEPBSAB REACTIVE (A) 09/08/2019   HEPBSAG Negative 09/17/2022   HEPBCAB  NON-REACTIVE 09/08/2019   Hepatitis C Lab Results  Component Value Date   HEPCAB NON-REACTIVE 09/08/2019   Hepatitis A Lab Results  Component Value Date   HAV BORDERLINE (A) 09/08/2019   Lipids: Lab Results  Component Value Date   CHOL 183 11/22/2020   TRIG 80 11/22/2020   HDL 51 11/22/2020   CHOLHDL 3.6 11/22/2020   VLDL 10 02/14/2013   LDLCALC 115 (H) 11/22/2020  TARGET DATE:  The 25th of the month  Assessment: Candice [name] presents today for their maintenance Cabenuva injections. Initial/past injections were tolerated well without issues. No problems with systemic effects of injections.   Administered cabotegravir 400mg /76mL in left upper outer quadrant of the gluteal muscle. Administered rilpivirine 600 mg/35mL in the right upper outer quadrant of the gluteal muscle. Monitored patient for 10 minutes after injection. Injections were tolerated well without issue. Patient will follow up in 2 months for next injection. Will check HIV RNA today.   Will be [redacted] weeks pregnant tomorrow. States she now has her C-section scheduled for 05/09/2023; reviewed that we cannot administer Cabenuva before the 18th. Rescheduled her next [facility] appointment with me after her C-section.  Will schedule follow up with Tammy Sours in 2 months following this appointment as she will transition back to every other month dosing after delivery. Requested an iron and vitamin D check today.   Plan: - Cabenuva injections administered - Check HIV RNA, iron, and vitamin D level - Next injections scheduled for 11/21 with me  - Call with any issues or questions  Margarite Gouge, PharmD, CPP, BCIDP, AAHIVP Clinical Pharmacist Practitioner Infectious Diseases Clinical Pharmacist [facility]

## 2023-04-07 ENCOUNTER — Ambulatory Visit: Payer: BC Managed Care – PPO | Admitting: *Deleted

## 2023-04-07 ENCOUNTER — Ambulatory Visit: Payer: BC Managed Care – PPO | Attending: Obstetrics and Gynecology

## 2023-04-07 ENCOUNTER — Other Ambulatory Visit: Payer: Self-pay

## 2023-04-07 VITALS — BP 123/73 | HR 105

## 2023-04-07 DIAGNOSIS — O24415 Gestational diabetes mellitus in pregnancy, controlled by oral hypoglycemic drugs: Secondary | ICD-10-CM | POA: Diagnosis not present

## 2023-04-07 DIAGNOSIS — O24419 Gestational diabetes mellitus in pregnancy, unspecified control: Secondary | ICD-10-CM | POA: Diagnosis not present

## 2023-04-07 DIAGNOSIS — O0990 Supervision of high risk pregnancy, unspecified, unspecified trimester: Secondary | ICD-10-CM

## 2023-04-07 DIAGNOSIS — O98713 Human immunodeficiency virus [HIV] disease complicating pregnancy, third trimester: Secondary | ICD-10-CM

## 2023-04-07 DIAGNOSIS — O99213 Obesity complicating pregnancy, third trimester: Secondary | ICD-10-CM | POA: Insufficient documentation

## 2023-04-07 DIAGNOSIS — O403XX0 Polyhydramnios, third trimester, not applicable or unspecified: Secondary | ICD-10-CM

## 2023-04-07 DIAGNOSIS — B20 Human immunodeficiency virus [HIV] disease: Secondary | ICD-10-CM

## 2023-04-07 DIAGNOSIS — O285 Abnormal chromosomal and genetic finding on antenatal screening of mother: Secondary | ICD-10-CM

## 2023-04-07 DIAGNOSIS — O99353 Diseases of the nervous system complicating pregnancy, third trimester: Secondary | ICD-10-CM

## 2023-04-07 DIAGNOSIS — O09523 Supervision of elderly multigravida, third trimester: Secondary | ICD-10-CM | POA: Insufficient documentation

## 2023-04-07 DIAGNOSIS — G35 Multiple sclerosis: Secondary | ICD-10-CM

## 2023-04-07 DIAGNOSIS — D563 Thalassemia minor: Secondary | ICD-10-CM

## 2023-04-07 DIAGNOSIS — O34219 Maternal care for unspecified type scar from previous cesarean delivery: Secondary | ICD-10-CM

## 2023-04-07 DIAGNOSIS — Z3A34 34 weeks gestation of pregnancy: Secondary | ICD-10-CM

## 2023-04-09 ENCOUNTER — Other Ambulatory Visit: Payer: Self-pay

## 2023-04-09 ENCOUNTER — Other Ambulatory Visit (HOSPITAL_COMMUNITY): Payer: Self-pay

## 2023-04-09 NOTE — Progress Notes (Signed)
Specialty Pharmacy Refill Coordination Note  Candice [name] is a 37 y.o. female assessed today regarding refills of clinic administered specialty medication(s) Cabotegravir & Rilpivirine   Clinic requested Courier to Provider Office   Delivery date: 04/10/23   Verified address: [facility] [address] [place] [address]   Medication will be filled on 04/09/23.

## 2023-04-10 ENCOUNTER — Other Ambulatory Visit: Payer: Self-pay

## 2023-04-10 ENCOUNTER — Telehealth: Payer: Self-pay

## 2023-04-10 ENCOUNTER — Ambulatory Visit (INDEPENDENT_AMBULATORY_CARE_PROVIDER_SITE_OTHER): Payer: BC Managed Care – PPO | Admitting: Pharmacist

## 2023-04-10 DIAGNOSIS — E559 Vitamin D deficiency, unspecified: Secondary | ICD-10-CM

## 2023-04-10 DIAGNOSIS — Z21 Asymptomatic human immunodeficiency virus [HIV] infection status: Secondary | ICD-10-CM | POA: Diagnosis not present

## 2023-04-10 MED ORDER — CABOTEGRAVIR & RILPIVIRINE ER 400 & 600 MG/2ML IM SUER
1.0000 | Freq: Once | INTRAMUSCULAR | Status: AC
Start: 2023-04-10 — End: 2023-04-10
  Administered 2023-04-10: 1 via INTRAMUSCULAR

## 2023-04-10 NOTE — Telephone Encounter (Signed)
[name]  Patient's medication Renaldo Harrison) have been couriered to [facility] from [facility] and will be administered on the patient next office visit on 04/10/23.  Clearance Coots , CPhT Specialty Pharmacy Patient [facility] Phone: [phone] Fax:  [phone]

## 2023-04-12 LAB — IRON,TIBC AND FERRITIN PANEL
%SAT: 47 % — ABNORMAL HIGH (ref 16–45)
Ferritin: 18 ng/mL (ref 16–154)
Iron: 203 ug/dL — ABNORMAL HIGH (ref 40–190)
TIBC: 436 ug/dL (ref 250–450)

## 2023-04-12 LAB — VITAMIN D 25 HYDROXY (VIT D DEFICIENCY, FRACTURES): Vit D, 25-Hydroxy: 48 ng/mL (ref 30–100)

## 2023-04-12 LAB — HIV-1 RNA QUANT-NO REFLEX-BLD
HIV 1 RNA Quant: NOT DETECTED {copies}/mL
HIV-1 RNA Quant, Log: NOT DETECTED {Log}

## 2023-04-14 ENCOUNTER — Other Ambulatory Visit: Payer: Self-pay

## 2023-04-14 ENCOUNTER — Encounter: Payer: Self-pay | Admitting: Family Medicine

## 2023-04-14 ENCOUNTER — Other Ambulatory Visit (HOSPITAL_COMMUNITY)
Admission: RE | Admit: 2023-04-14 | Discharge: 2023-04-14 | Disposition: A | Discharge status: Home / Self Care | Payer: BC Managed Care – PPO | Source: Ambulatory Visit | Attending: Family Medicine | Admitting: Family Medicine

## 2023-04-14 ENCOUNTER — Ambulatory Visit (INDEPENDENT_AMBULATORY_CARE_PROVIDER_SITE_OTHER): Payer: BC Managed Care – PPO | Admitting: Family Medicine

## 2023-04-14 ENCOUNTER — Ambulatory Visit: Payer: BC Managed Care – PPO | Admitting: *Deleted

## 2023-04-14 ENCOUNTER — Ambulatory Visit: Payer: BC Managed Care – PPO

## 2023-04-14 ENCOUNTER — Ambulatory Visit: Payer: BC Managed Care – PPO | Admitting: Family Medicine

## 2023-04-14 VITALS — BP 125/80 | HR 109 | Wt 240.2 lb

## 2023-04-14 VITALS — BP 122/65 | HR 92

## 2023-04-14 DIAGNOSIS — Z21 Asymptomatic human immunodeficiency virus [HIV] infection status: Secondary | ICD-10-CM

## 2023-04-14 DIAGNOSIS — O0990 Supervision of high risk pregnancy, unspecified, unspecified trimester: Secondary | ICD-10-CM

## 2023-04-14 DIAGNOSIS — A6000 Herpesviral infection of urogenital system, unspecified: Secondary | ICD-10-CM

## 2023-04-14 DIAGNOSIS — O0993 Supervision of high risk pregnancy, unspecified, third trimester: Secondary | ICD-10-CM

## 2023-04-14 DIAGNOSIS — O3663X0 Maternal care for excessive fetal growth, third trimester, not applicable or unspecified: Secondary | ICD-10-CM | POA: Diagnosis not present

## 2023-04-14 DIAGNOSIS — O24410 Gestational diabetes mellitus in pregnancy, diet controlled: Secondary | ICD-10-CM

## 2023-04-14 DIAGNOSIS — O99213 Obesity complicating pregnancy, third trimester: Secondary | ICD-10-CM | POA: Insufficient documentation

## 2023-04-14 DIAGNOSIS — O24419 Gestational diabetes mellitus in pregnancy, unspecified control: Secondary | ICD-10-CM | POA: Insufficient documentation

## 2023-04-14 DIAGNOSIS — O98713 Human immunodeficiency virus [HIV] disease complicating pregnancy, third trimester: Secondary | ICD-10-CM

## 2023-04-14 DIAGNOSIS — B20 Human immunodeficiency virus [HIV] disease: Secondary | ICD-10-CM

## 2023-04-14 DIAGNOSIS — Z3A35 35 weeks gestation of pregnancy: Secondary | ICD-10-CM

## 2023-04-14 DIAGNOSIS — O34219 Maternal care for unspecified type scar from previous cesarean delivery: Secondary | ICD-10-CM

## 2023-04-14 DIAGNOSIS — O24414 Gestational diabetes mellitus in pregnancy, insulin controlled: Secondary | ICD-10-CM | POA: Diagnosis not present

## 2023-04-14 DIAGNOSIS — O09523 Supervision of elderly multigravida, third trimester: Secondary | ICD-10-CM

## 2023-04-14 DIAGNOSIS — G35 Multiple sclerosis: Secondary | ICD-10-CM

## 2023-04-14 DIAGNOSIS — O403XX0 Polyhydramnios, third trimester, not applicable or unspecified: Secondary | ICD-10-CM

## 2023-04-14 DIAGNOSIS — O99353 Diseases of the nervous system complicating pregnancy, third trimester: Secondary | ICD-10-CM

## 2023-04-14 DIAGNOSIS — Z9889 Other specified postprocedural states: Secondary | ICD-10-CM

## 2023-04-14 DIAGNOSIS — E669 Obesity, unspecified: Secondary | ICD-10-CM

## 2023-04-14 MED ORDER — INSULIN GLARGINE 100 UNIT/ML SOLOSTAR PEN
12.0000 [IU] | PEN_INJECTOR | Freq: Every day | SUBCUTANEOUS | 11 refills | Status: DC
  Filled 2023-04-14: qty 9, 75d supply, fill #0

## 2023-04-14 MED ORDER — VALACYCLOVIR HCL 1 G PO TABS
1000.0000 mg | ORAL_TABLET | Freq: Two times a day (BID) | ORAL | 2 refills | Status: DC
  Filled 2023-04-14: qty 60, 30d supply, fill #0

## 2023-04-14 NOTE — Progress Notes (Signed)
   Subjective:  Candice [name] is a 37 y.o. G3P2002 at [redacted]w[redacted]d being seen today for ongoing prenatal care.  She is currently monitored for the following issues for this high-risk pregnancy and has OBESITY, NOS; Genital herpes; HIV-1 (human immunodeficiency virus I) (HCC); MS (multiple sclerosis) (HCC); History of depression; Supervision of high risk pregnancy, antepartum; Alpha thalassemia silent carrier; Pregnancy with history of cesarean section, antepartum; GDM (gestational diabetes mellitus); Elevated blood pressure reading in office without diagnosis of hypertension; History of reversal of tubal ligation; and Tobacco use affecting pregnancy in third trimester, antepartum on their problem list.  Patient reports no complaints.  Contractions: Irritability. Vag. Bleeding: None.  Movement: Present. Denies leaking of fluid.   The following portions of the patient's history were reviewed and updated as appropriate: allergies, current medications, past family history, past medical history, past social history, past surgical history and problem list. Problem list updated.  Objective:   Vitals:   04/14/23 1458  BP: 125/80  Pulse: (!) 109  Weight: 240 lb 3.2 oz (109 kg)    Fetal Status: Fetal Heart Rate (bpm): 148   Movement: Present     General:  Alert, oriented and cooperative. Patient is in no acute distress.  Skin: Skin is warm and dry. No rash noted.   Cardiovascular: Normal heart rate noted  Respiratory: Normal respiratory effort, no problems with respiration noted  Abdomen: Soft, gravid, appropriate for gestational age. Pain/Pressure: Present     Pelvic: Vag. Bleeding: None     Cervical exam deferred        Extremities: Normal range of motion.  Edema: Moderate pitting, indentation subsides rapidly  Mental Status: Normal mood and affect. Normal behavior. Normal judgment and thought content.   Urinalysis:        Assessment and Plan:  Pregnancy: G3P2002 at [redacted]w[redacted]d  1. Supervision of  high risk pregnancy, antepartum BP and FHR normal Swabs collected today Offered RSV vaccine, declines  2. Genital herpes simplex, unspecified site Refill sent per request, no outbreaks - valACYclovir (VALTREX) 1000 MG tablet; Take 1 tablet (1,000 mg total) by mouth 2 (two) times daily.  Dispense: 60 tablet; Refill: 2  3. Insulin controlled gestational diabetes mellitus (GDM) in third trimester Started on lantus 10u at bedtime last visit Control improved but still not adequate Increase to 12u at bedtime  Last growth [place] 03/23/23, EFW 91%, AC 97%, AFI 17 BPP earlier today 8/8 but AFI 23 Will move delivery up to 37 wks given poly, macrosomia New order placed  4. Pregnancy with history of cesarean section, antepartum Moved to 37 wks, order placed  5. MS (multiple sclerosis) (HCC) Follows w [place], no meds at present  6. HIV-1 (human immunodeficiency virus I) (HCC) Viral load undetectable throughout pregnancy, last done on 04/10/2023 On cabenuva injections q-month Following w MFM  7. History of reversal of tubal ligation   Preterm labor symptoms and general obstetric precautions including but not limited to vaginal bleeding, contractions, leaking of fluid and fetal movement were reviewed in detail with the patient. Please refer to After Visit Summary for other counseling recommendations.  Return in 1 week (on 04/21/2023) for [facility], ob visit, needs MD.   Venora Maples, MD

## 2023-04-14 NOTE — Patient Instructions (Signed)

## 2023-04-15 ENCOUNTER — Other Ambulatory Visit: Payer: Self-pay

## 2023-04-15 LAB — CERVICOVAGINAL ANCILLARY ONLY
Chlamydia: NEGATIVE
Comment: NEGATIVE
Comment: NORMAL
Neisseria Gonorrhea: NEGATIVE

## 2023-04-15 NOTE — Progress Notes (Signed)
Erroneous encounter

## 2023-04-18 LAB — CULTURE, BETA STREP (GROUP B ONLY): Strep Gp B Culture: NEGATIVE

## 2023-04-20 ENCOUNTER — Telehealth (HOSPITAL_COMMUNITY): Payer: Self-pay | Admitting: *Deleted

## 2023-04-20 ENCOUNTER — Encounter (HOSPITAL_COMMUNITY): Payer: Self-pay

## 2023-04-20 NOTE — Patient Instructions (Signed)
Candice [name]  04/20/2023   Your procedure is scheduled on:  04/25/2023  Arrive at 0730 at [employer] [facility] on [address] at [facility]  and [employer]. You are invited to use the FREE valet parking or use the Visitor's parking deck.  Pick up the phone at the desk and dial [phone].  Call this number if you have problems the morning of surgery: [phone]  Remember:   Do not eat food:(After Midnight) Desps de medianoche.  You may drink clear liquids until arrival at __0730___.  Clear liquids means a liquid you can see thru.  It can have color such as Cola or Kool aid.  Tea is OK and coffee as long as no milk or creamer of any kind.  Take these medicines the morning of surgery with A SIP OF WATER:  Take valtrex as prescribed.  Take half of the prescribed insulin dose the night before surgery.   Do not wear jewelry, make-up or nail polish.  Do not wear lotions, powders, or perfumes. Do not wear deodorant.  Do not shave 48 hours prior to surgery.  Do not bring valuables to the hospital.  [facility] is not   responsible for any belongings or valuables brought to the hospital.  Contacts, dentures or bridgework may not be worn into surgery.  Leave suitcase in the car. After surgery it may be brought to your room.  For patients admitted to the hospital, checkout time is 11:00 AM the day of              discharge.      Please read over the following fact sheets that you were given:     Preparing for Surgery

## 2023-04-20 NOTE — Telephone Encounter (Signed)
Preadmission screen  

## 2023-04-21 ENCOUNTER — Other Ambulatory Visit: Payer: Self-pay

## 2023-04-21 ENCOUNTER — Ambulatory Visit (HOSPITAL_BASED_OUTPATIENT_CLINIC_OR_DEPARTMENT_OTHER): Payer: BC Managed Care – PPO

## 2023-04-21 ENCOUNTER — Encounter: Payer: Self-pay | Admitting: Family Medicine

## 2023-04-21 ENCOUNTER — Ambulatory Visit (INDEPENDENT_AMBULATORY_CARE_PROVIDER_SITE_OTHER): Payer: BC Managed Care – PPO | Admitting: Family Medicine

## 2023-04-21 ENCOUNTER — Encounter (HOSPITAL_COMMUNITY): Payer: Self-pay

## 2023-04-21 ENCOUNTER — Ambulatory Visit: Payer: BC Managed Care – PPO | Attending: Obstetrics and Gynecology | Admitting: *Deleted

## 2023-04-21 VITALS — BP 139/87 | HR 105

## 2023-04-21 VITALS — BP 132/84 | HR 111 | Wt 241.1 lb

## 2023-04-21 DIAGNOSIS — Z3A36 36 weeks gestation of pregnancy: Secondary | ICD-10-CM

## 2023-04-21 DIAGNOSIS — O0990 Supervision of high risk pregnancy, unspecified, unspecified trimester: Secondary | ICD-10-CM

## 2023-04-21 DIAGNOSIS — Z9889 Other specified postprocedural states: Secondary | ICD-10-CM

## 2023-04-21 DIAGNOSIS — O24419 Gestational diabetes mellitus in pregnancy, unspecified control: Secondary | ICD-10-CM

## 2023-04-21 DIAGNOSIS — O99213 Obesity complicating pregnancy, third trimester: Secondary | ICD-10-CM | POA: Diagnosis not present

## 2023-04-21 DIAGNOSIS — O0993 Supervision of high risk pregnancy, unspecified, third trimester: Secondary | ICD-10-CM

## 2023-04-21 DIAGNOSIS — Z21 Asymptomatic human immunodeficiency virus [HIV] infection status: Secondary | ICD-10-CM

## 2023-04-21 DIAGNOSIS — O09523 Supervision of elderly multigravida, third trimester: Secondary | ICD-10-CM

## 2023-04-21 DIAGNOSIS — O34219 Maternal care for unspecified type scar from previous cesarean delivery: Secondary | ICD-10-CM

## 2023-04-21 DIAGNOSIS — O98713 Human immunodeficiency virus [HIV] disease complicating pregnancy, third trimester: Secondary | ICD-10-CM

## 2023-04-21 DIAGNOSIS — G35 Multiple sclerosis: Secondary | ICD-10-CM

## 2023-04-21 DIAGNOSIS — O3663X0 Maternal care for excessive fetal growth, third trimester, not applicable or unspecified: Secondary | ICD-10-CM | POA: Diagnosis not present

## 2023-04-21 DIAGNOSIS — O403XX0 Polyhydramnios, third trimester, not applicable or unspecified: Secondary | ICD-10-CM

## 2023-04-21 DIAGNOSIS — O24414 Gestational diabetes mellitus in pregnancy, insulin controlled: Secondary | ICD-10-CM | POA: Diagnosis not present

## 2023-04-21 DIAGNOSIS — O99013 Anemia complicating pregnancy, third trimester: Secondary | ICD-10-CM

## 2023-04-21 DIAGNOSIS — B20 Human immunodeficiency virus [HIV] disease: Secondary | ICD-10-CM

## 2023-04-21 DIAGNOSIS — A6000 Herpesviral infection of urogenital system, unspecified: Secondary | ICD-10-CM

## 2023-04-21 DIAGNOSIS — O99353 Diseases of the nervous system complicating pregnancy, third trimester: Secondary | ICD-10-CM

## 2023-04-21 DIAGNOSIS — D563 Thalassemia minor: Secondary | ICD-10-CM

## 2023-04-21 NOTE — Patient Instructions (Signed)

## 2023-04-21 NOTE — Progress Notes (Signed)
   Subjective:  Candice [name] is a 37 y.o. G3P2002 at [redacted]w[redacted]d being seen today for ongoing prenatal care.  She is currently monitored for the following issues for this high-risk pregnancy and has OBESITY, NOS; Genital herpes; HIV-1 (human immunodeficiency virus I) (HCC); MS (multiple sclerosis) (HCC); History of depression; Supervision of high risk pregnancy, antepartum; Alpha thalassemia silent carrier; Pregnancy with history of cesarean section, antepartum; GDM (gestational diabetes mellitus); Elevated blood pressure reading in office without diagnosis of hypertension; History of reversal of tubal ligation; and Tobacco use affecting pregnancy in third trimester, antepartum on their problem list.  Patient reports no complaints.  Contractions: Not present. Vag. Bleeding: None.  Movement: Present. Denies leaking of fluid.   The following portions of the patient's history were reviewed and updated as appropriate: allergies, current medications, past family history, past medical history, past social history, past surgical history and problem list. Problem list updated.  Objective:   Vitals:   04/21/23 1341  BP: 132/84  Pulse: (!) 111  Weight: 241 lb 1.6 oz (109.4 kg)    Fetal Status: Fetal Heart Rate (bpm): 148   Movement: Present     General:  Alert, oriented and cooperative. Patient is in no acute distress.  Skin: Skin is warm and dry. No rash noted.   Cardiovascular: Normal heart rate noted  Respiratory: Normal respiratory effort, no problems with respiration noted  Abdomen: Soft, gravid, appropriate for gestational age. Pain/Pressure: Present     Pelvic: Vag. Bleeding: None Vag D/C Character: Thin   Cervical exam deferred        Extremities: Normal range of motion.  Edema: Trace  Mental Status: Normal mood and affect. Normal behavior. Normal judgment and thought content.   Urinalysis:      Assessment and Plan:  Pregnancy: G3P2002 at [redacted]w[redacted]d  1. Supervision of high risk pregnancy,  antepartum BP and FHR normal  2. Genital herpes simplex, unspecified site On valtrex  3. Insulin controlled gestational diabetes mellitus (GDM) in third trimester Currently on lantus 12u at bedtime, log reviewed, still mildly above goal a majority of the time Increase lantus to 15u at bedtime  Last growth [place] today, EFW 3817g >99%, AC >99%, AFI 15 Already scheduled for delivery at 37 weeks in a few days  4. MS (multiple sclerosis) (HCC) Follows w [place], no meds at present   5. HIV-1 (human immunodeficiency virus I) (HCC) Viral load undetectable throughout pregnancy, last done on 04/10/2023 On cabenuva injections q-month Following w MFM   6. History of reversal of tubal ligation Confirmed she does not want repeat BTL or any other form of contraception   Preterm labor symptoms and general obstetric precautions including but not limited to vaginal bleeding, contractions, leaking of fluid and fetal movement were reviewed in detail with the patient. Please refer to After Visit Summary for other counseling recommendations.  Return in 7 weeks (on 06/09/2023) for PP check, needs MD.   Venora Maples, MD

## 2023-04-23 ENCOUNTER — Encounter (HOSPITAL_COMMUNITY)
Admission: RE | Admit: 2023-04-23 | Discharge: 2023-04-23 | Disposition: A | Discharge status: Home / Self Care | Payer: BC Managed Care – PPO | Source: Ambulatory Visit | Attending: Family Medicine | Admitting: Family Medicine

## 2023-04-23 DIAGNOSIS — A6000 Herpesviral infection of urogenital system, unspecified: Secondary | ICD-10-CM | POA: Diagnosis present

## 2023-04-23 DIAGNOSIS — O9872 Human immunodeficiency virus [HIV] disease complicating childbirth: Secondary | ICD-10-CM | POA: Diagnosis not present

## 2023-04-23 DIAGNOSIS — O24424 Gestational diabetes mellitus in childbirth, insulin controlled: Secondary | ICD-10-CM | POA: Diagnosis not present

## 2023-04-23 DIAGNOSIS — O24429 Gestational diabetes mellitus in childbirth, unspecified control: Secondary | ICD-10-CM | POA: Diagnosis not present

## 2023-04-23 DIAGNOSIS — O134 Gestational [pregnancy-induced] hypertension without significant proteinuria, complicating childbirth: Secondary | ICD-10-CM | POA: Diagnosis not present

## 2023-04-23 DIAGNOSIS — O9962 Diseases of the digestive system complicating childbirth: Secondary | ICD-10-CM | POA: Diagnosis not present

## 2023-04-23 DIAGNOSIS — D259 Leiomyoma of uterus, unspecified: Secondary | ICD-10-CM | POA: Diagnosis present

## 2023-04-23 DIAGNOSIS — Z79899 Other long term (current) drug therapy: Secondary | ICD-10-CM | POA: Diagnosis not present

## 2023-04-23 DIAGNOSIS — Z21 Asymptomatic human immunodeficiency virus [HIV] infection status: Secondary | ICD-10-CM | POA: Diagnosis not present

## 2023-04-23 DIAGNOSIS — O34219 Maternal care for unspecified type scar from previous cesarean delivery: Secondary | ICD-10-CM | POA: Insufficient documentation

## 2023-04-23 DIAGNOSIS — Z3A37 37 weeks gestation of pregnancy: Secondary | ICD-10-CM | POA: Diagnosis not present

## 2023-04-23 DIAGNOSIS — Z813 Family history of other psychoactive substance abuse and dependence: Secondary | ICD-10-CM | POA: Diagnosis not present

## 2023-04-23 DIAGNOSIS — D62 Acute posthemorrhagic anemia: Secondary | ICD-10-CM | POA: Diagnosis not present

## 2023-04-23 DIAGNOSIS — O34211 Maternal care for low transverse scar from previous cesarean delivery: Secondary | ICD-10-CM | POA: Diagnosis not present

## 2023-04-23 DIAGNOSIS — O99334 Smoking (tobacco) complicating childbirth: Secondary | ICD-10-CM | POA: Diagnosis not present

## 2023-04-23 DIAGNOSIS — Z148 Genetic carrier of other disease: Secondary | ICD-10-CM | POA: Diagnosis not present

## 2023-04-23 DIAGNOSIS — Z01812 Encounter for preprocedural laboratory examination: Secondary | ICD-10-CM | POA: Insufficient documentation

## 2023-04-23 DIAGNOSIS — O99214 Obesity complicating childbirth: Secondary | ICD-10-CM | POA: Diagnosis not present

## 2023-04-23 DIAGNOSIS — Z811 Family history of alcohol abuse and dependence: Secondary | ICD-10-CM | POA: Diagnosis not present

## 2023-04-23 DIAGNOSIS — O9081 Anemia of the puerperium: Secondary | ICD-10-CM | POA: Diagnosis not present

## 2023-04-23 DIAGNOSIS — G35 Multiple sclerosis: Secondary | ICD-10-CM | POA: Diagnosis not present

## 2023-04-23 DIAGNOSIS — O9832 Other infections with a predominantly sexual mode of transmission complicating childbirth: Secondary | ICD-10-CM | POA: Diagnosis not present

## 2023-04-23 DIAGNOSIS — F17210 Nicotine dependence, cigarettes, uncomplicated: Secondary | ICD-10-CM | POA: Diagnosis present

## 2023-04-23 DIAGNOSIS — O3413 Maternal care for benign tumor of corpus uteri, third trimester: Secondary | ICD-10-CM | POA: Diagnosis not present

## 2023-04-23 DIAGNOSIS — O99354 Diseases of the nervous system complicating childbirth: Secondary | ICD-10-CM | POA: Diagnosis not present

## 2023-04-23 DIAGNOSIS — K219 Gastro-esophageal reflux disease without esophagitis: Secondary | ICD-10-CM | POA: Diagnosis present

## 2023-04-23 LAB — CBC
HCT: 34.5 % — ABNORMAL LOW (ref 36.0–46.0)
Hemoglobin: 11.4 g/dL — ABNORMAL LOW (ref 12.0–15.0)
MCH: 27.8 pg (ref 26.0–34.0)
MCHC: 33 g/dL (ref 30.0–36.0)
MCV: 84.1 fL (ref 80.0–100.0)
Platelets: 383 10*3/uL (ref 150–400)
RBC: 4.1 MIL/uL (ref 3.87–5.11)
RDW: 18.2 % — ABNORMAL HIGH (ref 11.5–15.5)
WBC: 13.1 10*3/uL — ABNORMAL HIGH (ref 4.0–10.5)
nRBC: 0 % (ref 0.0–0.2)

## 2023-04-23 LAB — RPR: RPR Ser Ql: NONREACTIVE

## 2023-04-23 LAB — TYPE AND SCREEN
ABO/RH(D): AB POS
Antibody Screen: NEGATIVE

## 2023-04-24 ENCOUNTER — Encounter (HOSPITAL_COMMUNITY): Payer: Self-pay | Admitting: Obstetrics and Gynecology

## 2023-04-24 NOTE — Anesthesia Preprocedure Evaluation (Signed)
Anesthesia Evaluation  Patient identified by MRN, date of birth, ID band Patient awake    Reviewed: Allergy & Precautions, NPO status , Patient's Chart, lab work & pertinent test results  History of Anesthesia Complications (+) history of anesthetic complications (epidural had to be removed for entire left side being numb)  Airway Mallampati: II       Dental no notable dental hx. (+) Teeth Intact, Dental Advisory Given   Pulmonary Current Smoker   Pulmonary exam normal breath sounds clear to auscultation       Cardiovascular negative cardio ROS Normal cardiovascular exam Rhythm:Regular Rate:Normal     Neuro/Psych  Headaches PSYCHIATRIC DISORDERS  Depression     Neuromuscular disease (multiple sclerosis, right leg numbness)    GI/Hepatic Neg liver ROS,GERD  ,,  Endo/Other  diabetes, Well Controlled, Gestational, Insulin Dependent  Morbid obesity  Renal/GU negative Renal ROS  negative genitourinary   Musculoskeletal negative musculoskeletal ROS (+)    Abdominal  (+) + obese  Peds  Hematology  (+) Blood dyscrasia (alpha thalassemis silent carrier) Lab Results      Component                Value               Date                      WBC                      13.1 (H)            04/23/2023                HGB                      11.4 (L)            04/23/2023                HCT                      34.5 (L)            04/23/2023                MCV                      84.1                04/23/2023                PLT                      383                 04/23/2023              Anesthesia Other Findings HIV - on Cabenuva HSV - on valacyclovir  Previous c-section x2  Reproductive/Obstetrics (+) Pregnancy Previous C/Section x 2                             Anesthesia Physical Anesthesia Plan  ASA: 3  Anesthesia Plan: Epidural   Post-op Pain Management: Minimal or no pain anticipated  and Regional block*   Induction: Intravenous  PONV Risk Score and Plan: 1 and Ondansetron, Dexamethasone and Treatment may vary due to age or  medical condition  Airway Management Planned: Natural Airway  Additional Equipment: None and Fetal Monitoring  Intra-op Plan:   Post-operative Plan:   Informed Consent: I have reviewed the patients History and Physical, chart, labs and discussed the procedure including the risks, benefits and alternatives for the proposed anesthesia with the patient or authorized representative who has indicated his/her understanding and acceptance.     Dental advisory given  Plan Discussed with: CRNA and Anesthesiologist  Anesthesia Plan Comments: (I have discussed risks of neuraxial anesthesia including but not limited to infection, bleeding, nerve injury, back pain, headache, seizures, and failure of block. Patient denies bleeding disorders and is not currently anticoagulated. Labs have been reviewed. Risks and benefits discussed. All patient's questions answered.  )        Anesthesia Quick Evaluation

## 2023-04-25 ENCOUNTER — Encounter (HOSPITAL_COMMUNITY): Payer: Self-pay | Admitting: Obstetrics & Gynecology

## 2023-04-25 ENCOUNTER — Other Ambulatory Visit: Payer: Self-pay

## 2023-04-25 ENCOUNTER — Inpatient Hospital Stay (HOSPITAL_COMMUNITY): Payer: Self-pay | Admitting: Anesthesiology

## 2023-04-25 ENCOUNTER — Inpatient Hospital Stay (HOSPITAL_COMMUNITY): Payer: BC Managed Care – PPO | Admitting: Anesthesiology

## 2023-04-25 ENCOUNTER — Encounter (HOSPITAL_COMMUNITY)
Admission: RE | Disposition: A | Discharge status: Home / Self Care | Payer: Self-pay | Source: Home / Self Care | Attending: Obstetrics and Gynecology

## 2023-04-25 ENCOUNTER — Inpatient Hospital Stay (HOSPITAL_COMMUNITY)
Admission: RE | Admit: 2023-04-25 | Discharge: 2023-04-28 | DRG: 787 | Disposition: A | Discharge status: Home / Self Care | Payer: BC Managed Care – PPO | Attending: Obstetrics and Gynecology | Admitting: Obstetrics and Gynecology

## 2023-04-25 DIAGNOSIS — D62 Acute posthemorrhagic anemia: Secondary | ICD-10-CM | POA: Diagnosis not present

## 2023-04-25 DIAGNOSIS — G35 Multiple sclerosis: Secondary | ICD-10-CM | POA: Diagnosis present

## 2023-04-25 DIAGNOSIS — O3413 Maternal care for benign tumor of corpus uteri, third trimester: Secondary | ICD-10-CM | POA: Diagnosis present

## 2023-04-25 DIAGNOSIS — Z833 Family history of diabetes mellitus: Secondary | ICD-10-CM

## 2023-04-25 DIAGNOSIS — O34211 Maternal care for low transverse scar from previous cesarean delivery: Secondary | ICD-10-CM | POA: Diagnosis not present

## 2023-04-25 DIAGNOSIS — Z8249 Family history of ischemic heart disease and other diseases of the circulatory system: Secondary | ICD-10-CM

## 2023-04-25 DIAGNOSIS — O139 Gestational [pregnancy-induced] hypertension without significant proteinuria, unspecified trimester: Secondary | ICD-10-CM | POA: Insufficient documentation

## 2023-04-25 DIAGNOSIS — O134 Gestational [pregnancy-induced] hypertension without significant proteinuria, complicating childbirth: Secondary | ICD-10-CM | POA: Diagnosis present

## 2023-04-25 DIAGNOSIS — F17210 Nicotine dependence, cigarettes, uncomplicated: Secondary | ICD-10-CM | POA: Diagnosis present

## 2023-04-25 DIAGNOSIS — Z811 Family history of alcohol abuse and dependence: Secondary | ICD-10-CM | POA: Diagnosis not present

## 2023-04-25 DIAGNOSIS — O9962 Diseases of the digestive system complicating childbirth: Secondary | ICD-10-CM | POA: Diagnosis present

## 2023-04-25 DIAGNOSIS — O0990 Supervision of high risk pregnancy, unspecified, unspecified trimester: Secondary | ICD-10-CM

## 2023-04-25 DIAGNOSIS — Z98891 History of uterine scar from previous surgery: Principal | ICD-10-CM

## 2023-04-25 DIAGNOSIS — A6000 Herpesviral infection of urogenital system, unspecified: Secondary | ICD-10-CM | POA: Diagnosis present

## 2023-04-25 DIAGNOSIS — K219 Gastro-esophageal reflux disease without esophagitis: Secondary | ICD-10-CM | POA: Diagnosis present

## 2023-04-25 DIAGNOSIS — O99354 Diseases of the nervous system complicating childbirth: Secondary | ICD-10-CM | POA: Diagnosis present

## 2023-04-25 DIAGNOSIS — Z9889 Other specified postprocedural states: Secondary | ICD-10-CM

## 2023-04-25 DIAGNOSIS — O99214 Obesity complicating childbirth: Secondary | ICD-10-CM | POA: Diagnosis present

## 2023-04-25 DIAGNOSIS — Z818 Family history of other mental and behavioral disorders: Secondary | ICD-10-CM

## 2023-04-25 DIAGNOSIS — O9832 Other infections with a predominantly sexual mode of transmission complicating childbirth: Secondary | ICD-10-CM | POA: Diagnosis present

## 2023-04-25 DIAGNOSIS — O9081 Anemia of the puerperium: Secondary | ICD-10-CM | POA: Diagnosis not present

## 2023-04-25 DIAGNOSIS — O9872 Human immunodeficiency virus [HIV] disease complicating childbirth: Secondary | ICD-10-CM | POA: Diagnosis not present

## 2023-04-25 DIAGNOSIS — D259 Leiomyoma of uterus, unspecified: Secondary | ICD-10-CM | POA: Diagnosis present

## 2023-04-25 DIAGNOSIS — R030 Elevated blood-pressure reading, without diagnosis of hypertension: Secondary | ICD-10-CM | POA: Diagnosis present

## 2023-04-25 DIAGNOSIS — Z3A37 37 weeks gestation of pregnancy: Secondary | ICD-10-CM | POA: Diagnosis not present

## 2023-04-25 DIAGNOSIS — Z79899 Other long term (current) drug therapy: Secondary | ICD-10-CM | POA: Diagnosis not present

## 2023-04-25 DIAGNOSIS — O24424 Gestational diabetes mellitus in childbirth, insulin controlled: Secondary | ICD-10-CM | POA: Diagnosis present

## 2023-04-25 DIAGNOSIS — D563 Thalassemia minor: Secondary | ICD-10-CM | POA: Diagnosis present

## 2023-04-25 DIAGNOSIS — Z21 Asymptomatic human immunodeficiency virus [HIV] infection status: Secondary | ICD-10-CM | POA: Diagnosis present

## 2023-04-25 DIAGNOSIS — O99334 Smoking (tobacco) complicating childbirth: Secondary | ICD-10-CM | POA: Diagnosis not present

## 2023-04-25 DIAGNOSIS — Z148 Genetic carrier of other disease: Secondary | ICD-10-CM | POA: Diagnosis not present

## 2023-04-25 DIAGNOSIS — O99333 Smoking (tobacco) complicating pregnancy, third trimester: Secondary | ICD-10-CM | POA: Diagnosis present

## 2023-04-25 DIAGNOSIS — Z8659 Personal history of other mental and behavioral disorders: Secondary | ICD-10-CM

## 2023-04-25 DIAGNOSIS — O24419 Gestational diabetes mellitus in pregnancy, unspecified control: Secondary | ICD-10-CM | POA: Diagnosis present

## 2023-04-25 DIAGNOSIS — E669 Obesity, unspecified: Secondary | ICD-10-CM | POA: Diagnosis present

## 2023-04-25 DIAGNOSIS — Z813 Family history of other psychoactive substance abuse and dependence: Secondary | ICD-10-CM

## 2023-04-25 DIAGNOSIS — Z820 Family history of epilepsy and other diseases of the nervous system: Secondary | ICD-10-CM

## 2023-04-25 DIAGNOSIS — O24429 Gestational diabetes mellitus in childbirth, unspecified control: Secondary | ICD-10-CM | POA: Diagnosis not present

## 2023-04-25 LAB — GLUCOSE, CAPILLARY
Glucose-Capillary: 116 mg/dL — ABNORMAL HIGH (ref 70–99)
Glucose-Capillary: 112 mg/dL — ABNORMAL HIGH (ref 70–99)

## 2023-04-25 LAB — ABO/RH: ABO/RH(D): AB POS

## 2023-04-25 SURGERY — Surgical Case
Anesthesia: Epidural

## 2023-04-25 MED ORDER — CEFAZOLIN SODIUM-DEXTROSE 2-4 GM/100ML-% IV SOLN
INTRAVENOUS | Status: AC
  Filled 2023-04-25: qty 100

## 2023-04-25 MED ORDER — ACETAMINOPHEN 10 MG/ML IV SOLN
INTRAVENOUS | Status: DC | PRN
  Administered 2023-04-25: 1000 mg via INTRAVENOUS

## 2023-04-25 MED ORDER — LIDOCAINE-EPINEPHRINE (PF) 2 %-1:200000 IJ SOLN
INTRAMUSCULAR | Status: AC
  Filled 2023-04-25: qty 20

## 2023-04-25 MED ORDER — STERILE WATER FOR IRRIGATION IR SOLN
Status: DC | PRN
  Administered 2023-04-25: 1

## 2023-04-25 MED ORDER — SENNOSIDES-DOCUSATE SODIUM 8.6-50 MG PO TABS
2.0000 | ORAL_TABLET | Freq: Every day | ORAL | Status: DC
  Administered 2023-04-26 – 2023-04-28 (×3): 2 via ORAL
  Filled 2023-04-25 (×3): qty 2

## 2023-04-25 MED ORDER — LIDOCAINE-EPINEPHRINE (PF) 1.5 %-1:200000 IJ SOLN
INTRAMUSCULAR | Status: DC | PRN
  Administered 2023-04-25: 3 mL via EPIDURAL

## 2023-04-25 MED ORDER — OXYTOCIN-SODIUM CHLORIDE 30-0.9 UT/500ML-% IV SOLN: 2.5000 [IU]/h | INTRAVENOUS | Status: AC

## 2023-04-25 MED ORDER — COCONUT OIL OIL: 1.0000 | TOPICAL_OIL | Status: DC | PRN

## 2023-04-25 MED ORDER — OXYCODONE HCL 5 MG PO TABS: 5.0000 mg | ORAL_TABLET | Freq: Once | ORAL | Status: DC | PRN

## 2023-04-25 MED ORDER — OXYCODONE HCL 5 MG/5ML PO SOLN: 5.0000 mg | Freq: Once | ORAL | Status: DC | PRN

## 2023-04-25 MED ORDER — MENTHOL 3 MG MT LOZG: 1.0000 | LOZENGE | OROMUCOSAL | Status: DC | PRN

## 2023-04-25 MED ORDER — SIMETHICONE 80 MG PO CHEW
80.0000 mg | CHEWABLE_TABLET | Freq: Three times a day (TID) | ORAL | Status: DC
  Administered 2023-04-25 – 2023-04-28 (×8): 80 mg via ORAL
  Filled 2023-04-25 (×8): qty 1

## 2023-04-25 MED ORDER — KETOROLAC TROMETHAMINE 30 MG/ML IJ SOLN
30.0000 mg | Freq: Once | INTRAMUSCULAR | Status: AC | PRN
  Administered 2023-04-25: 30 mg via INTRAVENOUS

## 2023-04-25 MED ORDER — CEFAZOLIN SODIUM-DEXTROSE 2-4 GM/100ML-% IV SOLN
2.0000 g | INTRAVENOUS | Status: AC
  Administered 2023-04-25: 2 g via INTRAVENOUS

## 2023-04-25 MED ORDER — OXYCODONE HCL 5 MG PO TABS
5.0000 mg | ORAL_TABLET | ORAL | Status: DC | PRN
  Administered 2023-04-26 – 2023-04-27 (×2): 5 mg via ORAL
  Filled 2023-04-25 (×2): qty 1

## 2023-04-25 MED ORDER — ONDANSETRON HCL 4 MG/2ML IJ SOLN
INTRAMUSCULAR | Status: AC
  Filled 2023-04-25: qty 2

## 2023-04-25 MED ORDER — PHENYLEPHRINE 80 MCG/ML (10ML) SYRINGE FOR IV PUSH (FOR BLOOD PRESSURE SUPPORT)
PREFILLED_SYRINGE | INTRAVENOUS | Status: DC | PRN
  Administered 2023-04-25 (×4): 160 ug via INTRAVENOUS

## 2023-04-25 MED ORDER — TRANEXAMIC ACID-NACL 1000-0.7 MG/100ML-% IV SOLN
1000.0000 mg | Freq: Once | INTRAVENOUS | Status: AC
  Administered 2023-04-25: 1000 mg via INTRAVENOUS

## 2023-04-25 MED ORDER — FENTANYL CITRATE (PF) 100 MCG/2ML IJ SOLN
INTRAMUSCULAR | Status: AC
  Filled 2023-04-25: qty 2

## 2023-04-25 MED ORDER — FENTANYL CITRATE (PF) 100 MCG/2ML IJ SOLN: 25.0000 ug | INTRAMUSCULAR | Status: DC | PRN

## 2023-04-25 MED ORDER — ONDANSETRON HCL 4 MG/2ML IJ SOLN
INTRAMUSCULAR | Status: DC | PRN
  Administered 2023-04-25: 4 mg via INTRAVENOUS

## 2023-04-25 MED ORDER — FUROSEMIDE 20 MG PO TABS
40.0000 mg | ORAL_TABLET | Freq: Every day | ORAL | Status: DC
  Administered 2023-04-26 – 2023-04-28 (×3): 40 mg via ORAL
  Filled 2023-04-25 (×4): qty 2

## 2023-04-25 MED ORDER — MORPHINE SULFATE (PF) 0.5 MG/ML IJ SOLN
INTRAMUSCULAR | Status: AC
  Filled 2023-04-25: qty 10

## 2023-04-25 MED ORDER — IBUPROFEN 800 MG PO TABS
800.0000 mg | ORAL_TABLET | Freq: Three times a day (TID) | ORAL | Status: DC
  Administered 2023-04-26 – 2023-04-28 (×6): 800 mg via ORAL
  Filled 2023-04-25 (×6): qty 1

## 2023-04-25 MED ORDER — PRENATAL MULTIVITAMIN CH
1.0000 | ORAL_TABLET | Freq: Every day | ORAL | Status: DC
  Administered 2023-04-26 – 2023-04-28 (×3): 1 via ORAL
  Filled 2023-04-25 (×3): qty 1

## 2023-04-25 MED ORDER — DIBUCAINE (PERIANAL) 1 % EX OINT: 1.0000 | TOPICAL_OINTMENT | CUTANEOUS | Status: DC | PRN

## 2023-04-25 MED ORDER — SODIUM CHLORIDE 0.9 % IV SOLN: 12.5000 mg | INTRAVENOUS | Status: DC | PRN

## 2023-04-25 MED ORDER — DIPHENHYDRAMINE HCL 25 MG PO CAPS: 25.0000 mg | ORAL_CAPSULE | Freq: Four times a day (QID) | ORAL | Status: DC | PRN

## 2023-04-25 MED ORDER — SIMETHICONE 80 MG PO CHEW: 80.0000 mg | CHEWABLE_TABLET | ORAL | Status: DC | PRN

## 2023-04-25 MED ORDER — ENOXAPARIN SODIUM 60 MG/0.6ML IJ SOSY
0.5000 mg/kg | PREFILLED_SYRINGE | INTRAMUSCULAR | Status: DC
  Administered 2023-04-26 – 2023-04-28 (×3): 55 mg via SUBCUTANEOUS
  Filled 2023-04-25 (×3): qty 0.6

## 2023-04-25 MED ORDER — VITAMIN D 25 MCG (1000 UNIT) PO TABS
2000.0000 [IU] | ORAL_TABLET | Freq: Every day | ORAL | Status: DC
  Administered 2023-04-26 – 2023-04-28 (×4): 2000 [IU] via ORAL
  Filled 2023-04-25 (×4): qty 2

## 2023-04-25 MED ORDER — LACTATED RINGERS IV SOLN: INTRAVENOUS | Status: DC

## 2023-04-25 MED ORDER — KETOROLAC TROMETHAMINE 30 MG/ML IJ SOLN
30.0000 mg | Freq: Four times a day (QID) | INTRAMUSCULAR | Status: AC
  Administered 2023-04-25 – 2023-04-26 (×4): 30 mg via INTRAVENOUS
  Filled 2023-04-25 (×4): qty 1

## 2023-04-25 MED ORDER — PHENYLEPHRINE HCL-NACL 20-0.9 MG/250ML-% IV SOLN
INTRAVENOUS | Status: DC | PRN
  Administered 2023-04-25: 60 ug/min via INTRAVENOUS

## 2023-04-25 MED ORDER — DEXAMETHASONE SODIUM PHOSPHATE 10 MG/ML IJ SOLN
INTRAMUSCULAR | Status: DC | PRN
  Administered 2023-04-25: 4 mg via INTRAVENOUS

## 2023-04-25 MED ORDER — KETOROLAC TROMETHAMINE 30 MG/ML IJ SOLN
INTRAMUSCULAR | Status: AC
  Filled 2023-04-25: qty 1

## 2023-04-25 MED ORDER — OXYTOCIN-SODIUM CHLORIDE 30-0.9 UT/500ML-% IV SOLN
INTRAVENOUS | Status: DC | PRN
  Administered 2023-04-25: 300 mL via INTRAVENOUS

## 2023-04-25 MED ORDER — SOD CITRATE-CITRIC ACID 500-334 MG/5ML PO SOLN
30.0000 mL | Freq: Once | ORAL | Status: AC
  Administered 2023-04-25: 30 mL via ORAL

## 2023-04-25 MED ORDER — GABAPENTIN 100 MG PO CAPS
100.0000 mg | ORAL_CAPSULE | Freq: Three times a day (TID) | ORAL | Status: DC
  Administered 2023-04-25 – 2023-04-28 (×7): 100 mg via ORAL
  Filled 2023-04-25 (×8): qty 1

## 2023-04-25 MED ORDER — ACETAMINOPHEN 10 MG/ML IV SOLN: 1000.0000 mg | Freq: Once | INTRAVENOUS | Status: DC | PRN

## 2023-04-25 MED ORDER — ZOLPIDEM TARTRATE 5 MG PO TABS: 5.0000 mg | ORAL_TABLET | Freq: Every evening | ORAL | Status: DC | PRN

## 2023-04-25 MED ORDER — FENTANYL CITRATE (PF) 100 MCG/2ML IJ SOLN
INTRAMUSCULAR | Status: DC | PRN
  Administered 2023-04-25: 100 ug via EPIDURAL

## 2023-04-25 MED ORDER — MORPHINE SULFATE (PF) 0.5 MG/ML IJ SOLN
INTRAMUSCULAR | Status: DC | PRN
  Administered 2023-04-25: 3 mg via EPIDURAL

## 2023-04-25 MED ORDER — POVIDONE-IODINE 10 % EX SWAB
2.0000 | Freq: Once | CUTANEOUS | Status: AC
  Administered 2023-04-25: 2 via TOPICAL

## 2023-04-25 MED ORDER — WITCH HAZEL-GLYCERIN EX PADS: 1.0000 | MEDICATED_PAD | CUTANEOUS | Status: DC | PRN

## 2023-04-25 MED ORDER — SODIUM BICARBONATE 8.4 % IV SOLN
INTRAVENOUS | Status: DC | PRN
  Administered 2023-04-25 (×3): 5 mL via EPIDURAL

## 2023-04-25 MED ORDER — SOD CITRATE-CITRIC ACID 500-334 MG/5ML PO SOLN
ORAL | Status: AC
  Filled 2023-04-25: qty 30

## 2023-04-25 MED ORDER — ACETAMINOPHEN 500 MG PO TABS
1000.0000 mg | ORAL_TABLET | Freq: Three times a day (TID) | ORAL | Status: DC
  Administered 2023-04-25 – 2023-04-28 (×9): 1000 mg via ORAL
  Filled 2023-04-25 (×10): qty 2

## 2023-04-25 MED ORDER — SODIUM BICARBONATE 8.4 % IV SOLN
INTRAVENOUS | Status: AC
  Filled 2023-04-25: qty 50

## 2023-04-25 SURGICAL SUPPLY — 38 items
APL PRP STRL LF DISP 70% ISPRP (MISCELLANEOUS) ×2
APL SKNCLS STERI-STRIP NONHPOA (GAUZE/BANDAGES/DRESSINGS) ×1
BENZOIN TINCTURE PRP APPL 2/3 (GAUZE/BANDAGES/DRESSINGS) IMPLANT
CANISTER PREVENA PLUS 150 (CANNISTER) IMPLANT
CHLORAPREP W/TINT 26 (MISCELLANEOUS) ×4 IMPLANT
CLAMP UMBILICAL CORD (MISCELLANEOUS) ×2 IMPLANT
CLOTH BEACON ORANGE TIMEOUT ST (SAFETY) ×2 IMPLANT
DRESSING PREVENA PLUS CUSTOM (GAUZE/BANDAGES/DRESSINGS) IMPLANT
DRSG OPSITE POSTOP 4X10 (GAUZE/BANDAGES/DRESSINGS) ×2 IMPLANT
DRSG PREVENA PLUS CUSTOM (GAUZE/BANDAGES/DRESSINGS) ×2
ELECT REM PT RETURN 9FT ADLT (ELECTROSURGICAL) ×1
ELECTRODE REM PT RTRN 9FT ADLT (ELECTROSURGICAL) ×2 IMPLANT
EXTRACTOR VACUUM BELL STYLE (SUCTIONS) IMPLANT
GAUZE SPONGE 4X4 12PLY STRL LF (GAUZE/BANDAGES/DRESSINGS) IMPLANT
GLOVE BIOGEL PI IND STRL 6.5 (GLOVE) ×4 IMPLANT
GLOVE ECLIPSE 6.5 STRL STRAW (GLOVE) ×4 IMPLANT
GOWN STRL REUS W/TWL LRG LVL3 (GOWN DISPOSABLE) ×6 IMPLANT
KIT ABG SYR 3ML LUER SLIP (SYRINGE) IMPLANT
MAT PREVALON FULL STRYKER (MISCELLANEOUS) IMPLANT
NDL HYPO 25X1 1.5 SAFETY (NEEDLE) IMPLANT
NEEDLE HYPO 22GX1.5 SAFETY (NEEDLE) ×2 IMPLANT
NEEDLE HYPO 25X1 1.5 SAFETY (NEEDLE) IMPLANT
NS IRRIG 1000ML POUR BTL (IV SOLUTION) ×2 IMPLANT
PACK C SECTION WH (CUSTOM PROCEDURE TRAY) ×2 IMPLANT
PAD OB MATERNITY 4.3X12.25 (PERSONAL CARE ITEMS) ×2 IMPLANT
RETRACTOR TRAXI PANNICULUS (MISCELLANEOUS) IMPLANT
STRIP CLOSURE SKIN 1/2X4 (GAUZE/BANDAGES/DRESSINGS) IMPLANT
SUT MON AB 4-0 PS1 27 (SUTURE) ×2 IMPLANT
SUT PDS AB 0 CTX 60 (SUTURE) IMPLANT
SUT PLAIN 2 0 (SUTURE) ×1
SUT PLAIN ABS 2-0 CT1 27XMFL (SUTURE) ×2 IMPLANT
SUT VIC AB 0 CT1 36 (SUTURE) ×4 IMPLANT
SUT VIC AB 0 CTX 36 (SUTURE) ×1
SUT VIC AB 0 CTX36XBRD ANBCTRL (SUTURE) ×2 IMPLANT
SYR CONTROL 10ML LL (SYRINGE) ×2 IMPLANT
TOWEL OR 17X24 6PK STRL BLUE (TOWEL DISPOSABLE) ×2 IMPLANT
TRAY FOLEY W/BAG SLVR 14FR LF (SET/KITS/TRAYS/PACK) ×2 IMPLANT
WATER STERILE IRR 1000ML POUR (IV SOLUTION) ×2 IMPLANT

## 2023-04-25 NOTE — Transfer of Care (Signed)
Immediate Anesthesia Transfer of Care Note  Patient: Candice [name]  Procedure(s) Performed: CESAREAN SECTION  Patient Location: PACU  Anesthesia Type:Epidural  Level of Consciousness: awake  Airway & Oxygen Therapy: Patient Spontanous Breathing  Post-op Assessment: Report given to RN  Post vital signs: Reviewed  Last Vitals:  Vitals Value Taken Time  BP 119/66 04/25/23 1215  Temp 36.6 C 04/25/23 1130  Pulse 66 04/25/23 1228  Resp 14 04/25/23 1228  SpO2 100 % 04/25/23 1228  Vitals shown include unfiled device data.  Last Pain:  Vitals:   04/25/23 1145  TempSrc:   PainSc: 0-No pain         Complications: No notable events documented.

## 2023-04-25 NOTE — Progress Notes (Signed)
RN notified Dr. Earlene Plater of patient's elevated blood pressures of 138/75 and then orthostatics of 133/85, 141/78 and 141/81.  She stated we would continue to keep an eye on them and gave new parameters to call for severe range 160/110. RN will continue to monitor.  Candice [name], [place]

## 2023-04-25 NOTE — Lactation Note (Signed)
This note was copied from a baby's chart. Lactation Consultation Note  Patient Name: Girl Eleena Grater [id number] Date: 04/25/2023 Age:39 hours Reason for consult: Initial assessment;Early term 21-38.6wks Mom stated the baby BF well for about 7 min. Then a friend gave the baby formula. Mom will be feeding that way until her milk comes in. Mom had breast Augmentation 2019 after she had her other 2 children that she BF.  Mom stated the Lt. Breast has no feeling or sensation to the nipple or areola. The Rt. Nipple and areola mom states are very sensitive. Mom stated she can see colostrum from Rt. Breast but not from Lt. Discussed mom pumping and is in agreement. Mom pumped and got glistening to Rt. Breast. Mom is GDM and giving formula as well as BF. Mom understands she may not have good milk supply in the Lt. Breast as much as in the Rt. Breast. Mom will call for latch assistance. Mom isn't feeling well having blurred vision from medication. Mom encouraged to feed baby 8-12 times/24 hours and with feeding cues.    Maternal Data Has patient been taught Hand Expression?: Yes Does the patient have breastfeeding experience prior to this delivery?: Yes How long did the patient breastfeed?: 3 months each to her now 38 yr old and 39 yr old  Feeding    LATCH Score Latch:  (didn't see latch)  Audible Swallowing: A few with stimulation  Type of Nipple: Everted at rest and after stimulation (very short shaft./compressible)  Comfort (Breast/Nipple): Soft / non-tender  Hold (Positioning): Assistance needed to correctly position infant at breast and maintain latch.  LATCH Score: 7   Lactation Tools Discussed/Used Tools: Pump;Flanges Flange Size: 18 Breast pump type: Double-Electric Breast Pump Pump Education: Setup, frequency, and cleaning Reason for Pumping: breast Augmentation Pumping frequency: q 3hr Pumped volume: 0 mL  Interventions Interventions: Breast feeding basics  reviewed;Hand express;Breast compression;DEBP;Education;LC Services brochure  Discharge    Consult Status Consult Status: Follow-up Date: 04/26/23 Follow-up type: In-patient    Charyl Dancer 04/25/2023, 9:31 PM

## 2023-04-25 NOTE — Anesthesia Postprocedure Evaluation (Signed)
Anesthesia Post Note  Patient: Candice [name]  Procedure(s) Performed: CESAREAN SECTION     Patient location during evaluation: PACU Anesthesia Type: Epidural Level of consciousness: awake Pain management: pain level controlled Vital Signs Assessment: post-procedure vital signs reviewed and stable Respiratory status: spontaneous breathing, nonlabored ventilation and respiratory function stable Cardiovascular status: stable Postop Assessment: no headache, no backache and epidural receding Anesthetic complications: no   No notable events documented.  Last Vitals:  Vitals:   04/25/23 1215 04/25/23 1230  BP: 119/66 129/78  Pulse: 65 64  Resp: 15 15  Temp:    SpO2: 100%     Last Pain:  Vitals:   04/25/23 1230  TempSrc:   PainSc: 0-No pain   Pain Goal:    LLE Motor Response: Purposeful movement (04/25/23 1230) LLE Sensation: Tingling (04/25/23 1230) RLE Motor Response: Purposeful movement (04/25/23 1230) RLE Sensation: Tingling (04/25/23 1230)        Linton Rump

## 2023-04-25 NOTE — Op Note (Signed)
Cesarean Section Operative Note   Patient: Candice [name]  Date of Procedure: 04/25/2023  Procedure: Repeat Low Transverse Cesarean   Indications: patient declines vag del attempt, elective repeat, previous uterine incision: low transverse, A2GDM  Pre-operative Diagnosis: Repeat Cesarean Section.   Post-operative Diagnosis: Same  TOLAC Candidate: No  Surgeon: Surgeons and Role:    * Conan Bowens, MD - Primary    * Joanne Gavel, MD - Assisting  Assistants:   An experienced assistant was required given the standard of surgical care given the complexity of the case.  This assistant was needed for exposure, dissection, suctioning, retraction, instrument exchange, assisting with delivery with administration of fundal pressure, and for overall help during the procedure.   Anesthesia: spinal  Anesthesiologist: Linton Rump, MD   Antibiotics: Cefazolin   Estimated Blood Loss: 1079 ml   Total IV Fluids: 1300 ml  Urine Output:  850 cc OF clear urine  Specimens: none   Complications: no complications   Indications: Candice [name] is a 37 y.o. [id number] with an IUP [redacted]w[redacted]d presenting for scheduled cesarean secondary to the indications listed above. Pregnancy c/b A2GDM, MS, HIV, tobacco use, hx HSV, hx tubal reversal.  The risks of cesarean section discussed with the patient included but were not limited to: bleeding which may require transfusion or reoperation; infection which may require antibiotics; injury to bowel, bladder, ureters or other surrounding organs; injury to the fetus; need for additional procedures including hysterectomy in the event of a life-threatening hemorrhage; placental abnormalities with subsequent pregnancies, incisional problems, thromboembolic phenomenon and other postoperative/anesthesia complications. The patient concurred with the proposed plan, giving informed written consent for the procedure. Patient has been NPO since last night  she will remain NPO for procedure. Anesthesia and OR aware. Preoperative prophylactic antibiotics and SCDs ordered on call to the OR.   Findings: Viable infant in cephalic presentation, no nuchal cord present. Apgars 9, 9, . Weight 3410 g. Clear amniotic fluid. Normal placenta, three vessel cord. Fibroid uterus, L fallopian tube with re-anastomosis visible, R normal, Normal bilateral ovaries. Moderate adhesive disease, consistent with 2 prior C/S deliveries .  Procedure Details: A Time Out was held and the above information confirmed. The patient received intravenous antibiotics and had sequential compression devices applied to her lower extremities preoperatively. The patient was taken back to the operative suite where spinal anesthesia was administered. After induction of anesthesia, the patient was draped and prepped in the usual sterile manner and placed in a dorsal supine position with a leftward tilt. A low transverse skin incision was made with scalpel and carried down through the subcutaneous tissue to the fascia. Fascial incision was made and extended transversely. The fascia was separated from the underlying rectus tissue superiorly and inferiorly. The rectus muscles were separated in the midline bluntly and the peritoneum was entered bluntly. An Alexis retractor was placed to aid in visualization of the uterus. The utero-vesical peritoneal reflection was incised transversely and the bladder flap was bluntly freed from the lower uterine segment. A low transverse uterine incision was made. The infant was successfully delivered from cephalic presentation, the umbilical cord was clamped after 1 minute. Cord ph was not sent, and cord blood was obtained for evaluation. The placenta was removed Intact and appeared normal. The uterine incision was closed with a single layer running locked suture of 0-Monocryl. Due to ongoing bleeding from the hysterotomy figure of eight sutures of 0 Monocryl were placed after  which there was excellent hemostasis.  The abdomen and the pelvis were cleared of all clot and debris and the Candice [name] was removed. Hemostasis was confirmed on all surfaces.  The peritoneum was was not reapproximated. The fascia was then closed using a looped O-PDS in a running fashion. The subcutaneous layer was re-approximated with 2-0 vicryl. The skin was closed with a 4-0 vicryl subcuticular stitch. The patient tolerated the procedure well. Sponge, lap, instrument and needle counts were correct x 2. She was taken to the recovery room in stable condition.  Disposition: PACU - hemodynamically stable.    Signed: Joanne Gavel, MD OB Fellow, [facility] for [facility], [facility]

## 2023-04-25 NOTE — Progress Notes (Addendum)
Dr Donavan Foil called. Discussed if any  antivirals pre-operatively. None needed at this time.

## 2023-04-25 NOTE — Anesthesia Procedure Notes (Signed)
Epidural Patient location during procedure: OB Start time: 04/25/2023 9:36 AM End time: 04/25/2023 9:43 AM  Staffing Anesthesiologist: Linton Rump, MD Performed: anesthesiologist   Preanesthetic Checklist Completed: patient identified, IV checked, site marked, risks and benefits discussed, surgical consent, monitors and equipment checked, pre-op evaluation and timeout performed  Epidural Patient position: sitting Prep: DuraPrep and site prepped and draped Patient monitoring: continuous pulse ox and blood pressure Approach: midline Location: L3-L4 Injection technique: LOR saline  Needle:  Needle type: Tuohy  Needle gauge: 17 G Needle length: 9 cm and 9 Needle insertion depth: 7 cm Catheter type: closed end flexible Catheter size: 19 Gauge Catheter at skin depth: 12 cm Test dose: negative  Assessment Sensory level: T4 Events: blood not aspirated, no cerebrospinal fluid, injection not painful, no injection resistance, no paresthesia and negative IV test  Additional Notes The patient has requested an epidural for labor pain management. Risks and benefits including, but not limited to, infection, bleeding, local anesthetic toxicity, headache, hypotension, back pain, block failure, etc. were discussed with the patient. The patient expressed understanding and consented to the procedure. I confirmed that the patient has no bleeding disorders and is not taking blood thinners. I confirmed the patient's last platelet count with the nurse. A time-out was performed immediately prior to the procedure. Please see nursing documentation for vital signs. Sterile technique was used throughout the whole procedure. Once LOR achieved, the epidural catheter threaded easily without resistance. Aspiration of the catheter was negative for blood and CSF. The epidural was dosed slowly and an infusion was started.  1 attempt(s) with 2 re-directions.Reason for block:procedure for pain

## 2023-04-25 NOTE — Discharge Summary (Signed)
Postpartum Discharge Summary  Date of Service updated***     Patient Name: Candice [name] DOB: Mar 02, 1986 MRN: [id number]  Date of admission: 04/25/2023 Delivery date:04/25/2023 Delivering provider: Conan Bowens Date of discharge: 04/25/2023  Admitting diagnosis: Previous cesarean section [Z98.891] Intrauterine pregnancy: [redacted]w[redacted]d     Secondary diagnosis:  Principal Problem:   Status post repeat low transverse cesarean section Active Problems:   OBESITY, NOS   Genital herpes   HIV-1 (human immunodeficiency virus I) (HCC)   MS (multiple sclerosis) (HCC)   History of depression   Supervision of high risk pregnancy, antepartum   Alpha thalassemia silent carrier   GDM (gestational diabetes mellitus)   Elevated blood pressure reading in office without diagnosis of hypertension   History of reversal of tubal ligation   Tobacco use affecting pregnancy in third trimester, antepartum  Additional problems: ***    Discharge diagnosis: Term Pregnancy Delivered and GDM A2                                              Post partum procedures:*** Augmentation: N/A Complications: None  Hospital course: Sceduled C/S   37 y.o. yo G3P3003 at [redacted]w[redacted]d was admitted to the hospital 04/25/2023 for scheduled cesarean section with the following indication:Elective Repeat.Delivery details are as follows:  Membrane Rupture Time/Date: 10:19 AM,04/25/2023  Delivery Method:C-Section, Low Transverse Operative Delivery:N/A Details of operation can be found in separate operative note.  Patient had a postpartum course complicated by***.  She is ambulating, tolerating a regular diet, passing flatus, and urinating well. Patient is discharged home in stable condition on  04/25/23        Newborn Data: Birth date:04/25/2023 Birth time:10:20 AM Gender:Female Living status:Living Apgars:9 ,9  Weight:3410 g    Magnesium Sulfate received: No BMZ received: No Rhophylac:No MMR:No T-DaP:Given prenatally Flu:  {WUJ:81191} RSV Vaccine received: No Transfusion:{Transfusion received:30440034}  Immunizations received: Immunization History  Administered Date(s) Administered   Hepatitis A, Adult 05/01/2022   Influenza Split 03/18/2011, 07/24/2011, 04/28/2012   Influenza Whole 06/03/2007   Meningococcal Mcv4o 02/10/2020, 08/19/2021   Td 11/22/2002   Tdap 09/25/2014, 02/06/2023    Physical exam  Vitals:   04/25/23 0848 04/25/23 0849 04/25/23 0850 04/25/23 0851  BP:      Pulse: 97 88 91 96  Resp:      Temp:      TempSrc:      SpO2: 97% 98% 98% 98%  Weight:      Height:       General: {Exam; general:21111117} Lochia: {Desc; appropriate/inappropriate:30686::"appropriate"} Uterine Fundus: {Desc; firm/soft:30687} Incision: {Exam; incision:21111123} DVT Evaluation: {Exam; dvt:2111122} Labs: Lab Results  Component Value Date   WBC 13.1 (H) 04/23/2023   HGB 11.4 (L) 04/23/2023   HCT 34.5 (L) 04/23/2023   MCV 84.1 04/23/2023   PLT 383 04/23/2023      Latest Ref Rng & Units 03/02/2023    9:27 AM  CMP  Glucose 70 - 99 mg/dL 478   BUN 6 - 20 mg/dL 4   Creatinine 2.95 - 6.21 mg/dL 3.08   Sodium 657 - 846 mmol/L 138   Potassium 3.5 - 5.2 mmol/L 3.7   Chloride 96 - 106 mmol/L 105   CO2 20 - 29 mmol/L 18   Calcium 8.7 - 10.2 mg/dL 8.9   Total Protein 6.0 - 8.5 g/dL 6.1   Total Bilirubin  0.0 - 1.2 mg/dL <8.2   Alkaline Phos 44 - 121 IU/L 70   AST 0 - 40 IU/L 15   ALT 0 - 32 IU/L 16    Edinburgh Score:     No data to display         No data recorded  After visit meds:  Allergies as of 04/25/2023       Reactions   Contrast Media [iodinated Contrast Media] Nausea And Vomiting     Med Rec must be completed prior to using this [facility]***        Discharge home in stable condition Infant Feeding: {Baby feeding:23562} Infant Disposition:{CHL IP OB HOME WITH NFAOZH:08657} Discharge instruction: per After Visit Summary and Postpartum booklet. Activity: Advance as  tolerated. Pelvic rest for 6 weeks.  Diet: {OB QION:62952841} Future Appointments: Future Appointments  Date Time Provider Department Center  05/14/2023  9:45 AM Jennette Kettle, RPH-CPP [facility]  06/10/2023  1:15 PM Anyanwu, Jethro Bastos, MD [facility]  07/15/2023 10:50 AM Drema Dallas, DO [facility] None   Follow up Visit:  Message sent to [facility] 11/2  Please schedule this patient for a In person postpartum visit in 6 weeks with the following provider: Any provider. Additional Postpartum F/U: Incision check 1 week, GTT 6 weeks High risk pregnancy complicated by: Zollie Beckers, HIV Delivery mode:  C-Section, Low Transverse Anticipated Birth Control:   Declines   04/25/2023 Joanne Gavel, MD

## 2023-04-25 NOTE — Progress Notes (Signed)
When reviewing medication history pt reports painful "boil" on right labia and requests OB provider "take a look" today.

## 2023-04-25 NOTE — H&P (Addendum)
Obstetric Preoperative History and Physical  Candice [name] is a 37 y.o. [id number] with IUP at [redacted]w[redacted]d presenting for scheduled cesarean section.  No acute concerns.   Prenatal Course Source of Care: [facility] with onset of care at 9 weeks Pregnancy complications or risks: Patient Active Problem List   Diagnosis Date Noted   Previous cesarean section 04/25/2023   Tobacco use affecting pregnancy in third trimester, antepartum 03/31/2023   Elevated blood pressure reading in office without diagnosis of hypertension 03/17/2023   History of reversal of tubal ligation 03/17/2023   GDM (gestational diabetes mellitus) 02/11/2023   Pregnancy with history of cesarean section, antepartum 01/21/2023   Alpha thalassemia silent carrier 11/28/2022   Supervision of high risk pregnancy, antepartum 11/10/2022   History of depression 01/13/2022   MS (multiple sclerosis) (HCC) 05/21/2020   HIV-1 (human immunodeficiency virus I) (HCC) 09/07/2019   Genital herpes 02/16/2013   OBESITY, NOS 08/20/2006   She plans to bottle feed She desires no method for postpartum contraception.   Prenatal labs and studies: ABO, Rh: --/--/AB POS Performed at [facility], [address], [place], [place] [address]  (11/02 0500) Antibody: NEG (10/31 0855) Rubella: 8.07 (03/27 1439) RPR: NON REACTIVE (10/31 0855)  HBsAg: Negative (03/27 1439)  HIV: Preliminary Reactive (08/16 5621)  HYQ:MVHQIONG/-- (10/22 0319) 2 hr Glucola  elevated Genetic screening normal Anatomy US normal  Prenatal Transfer Tool  Fetal Ultrasounds or other Referrals:  None Maternal Substance Abuse:  No Significant Maternal Medications:  cabenuva Significant Maternal Lab Results: HIV positive, known prior to pregnancy  Past Medical History:  Diagnosis Date   Abnormal MRI, spine 04/06/2020   Arthralgia 02/18/2022   Back pain 10/13/2011   Complication of anesthesia    epidural with last pregnancy made entire left side numb, had to  d/c   Depression    Frequent urination 09/11/2021   GDM (gestational diabetes mellitus) 02/11/2023   GERD (gastroesophageal reflux disease) 05/21/2020   Headache    HIV-1 (human immunodeficiency virus I) (HCC)    HSV 06/28/2007   Hx of migraines    Major depressive disorder, recurrent episode, moderate (HCC) 06/08/2013   Multiple sclerosis (HCC)    Right leg numbness 02/07/2020   Vitamin D deficiency 06/20/2022    Past Surgical History:  Procedure Laterality Date   BREAST SURGERY  2019   reduction   CESAREAN SECTION     CESAREAN SECTION  09/07/2011   Procedure: CESAREAN SECTION;  Surgeon: Lazaro Arms, MD;  Location: [facility];  Service: Gynecology;  Laterality: N/A;  Repeat cesarean section with delivery of baby boy at 45. Apgars 9/9.  Bilateral tubal ligation.   IUD REMOVAL  2005   in cervix   TUBAL LIGATION     tubal reanastomosis  08/13/2016    OB History  Gravida Para Term Preterm AB Living  3 2 2  0 0 2  SAB IAB Ectopic Multiple Live Births  0 0 0 0 2    # Outcome Date GA Lbr Len/2nd Weight Sex Type Anes PTL Lv  3 Current           2 Term 09/07/11 [redacted]w[redacted]d  3130 g M CS-Vac EPI  LIV     Birth Comments: C/S for failure to descend Mom with h/o THC use until >[redacted] weeks pregnant, continued tobacco use.  1 Term 07/25/07 [redacted]w[redacted]d  3402 g F CS-LTranv EPI N LIV    Social History   Socioeconomic History   Marital  status: Single    Spouse name: Not on file   Number of children: 2   Years of education: 14   Highest education level: Not on file  Occupational History    Comment: grocery manager  Tobacco Use   Smoking status: Every Day    Current packs/day: 0.25    Types: Cigarettes    Passive exposure: Current   Smokeless tobacco: Never   Tobacco comments:    Trying to quit due to pregnancy   Vaping Use   Vaping status: Never Used  Substance and Sexual Activity   Alcohol use: Not Currently    Comment: occasional   Drug use: Not Currently    Types: Marijuana     Comment: patient reports last THC use beginning of April   Sexual activity: Not Currently    Partners: Male    Birth control/protection: None    Comment: Decline condoms  Other Topics Concern   Not on file  Social History Narrative   Works as Associate Professor.   FOB of both children lives in [place]; will be some what involved but mother is no longer dating him.   Social Determinants of Health   Financial Resource Strain: Not on file  Food Insecurity: Food Insecurity Present (02/23/2023)   Hunger Vital Sign    Worried About Running Out of Food in the Last Year: Sometimes true    Ran Out of Food in the Last Year: Sometimes true  Transportation Needs: Not on file  Physical Activity: Not on file  Stress: Not on file  Social Connections: Not on file    Family History  Problem Relation Age of Onset   Alcohol abuse Mother    Drug abuse Mother    Depression Mother    Alcohol abuse Father    Drug abuse Father    Multiple sclerosis Sister    Mental illness Sister        bipolar ptsd   Schizophrenia Sister    Diabetes Maternal Grandmother    Hypertension Maternal Grandmother    Cancer Maternal Grandmother        ovarian, breast and lung   Depression Maternal Grandmother     Facility-Administered Medications Prior to Admission  Medication Dose Route Frequency Provider Last Rate Last Admin   cabotegravir & rilpivirine ER (CABENUVA) 400 & 600 MG/2ML injection 1 kit  1 kit Intramuscular Once Veryl Speak, FNP       Medications Prior to Admission  Medication Sig Dispense Refill Last Dose   acetaminophen (TYLENOL) 500 MG tablet Take 500 mg by mouth every 6 (six) hours as needed for mild pain.      cabotegravir & rilpivirine ER (CABENUVA) 400 & 600 MG/2ML injection Inject 1 kit into the muscle every 30 (thirty) days. 4 mL 5    Cholecalciferol (VITAMIN D) 125 MCG (5000 UT) CAPS Take 5,000 Units by mouth daily.   04/24/2023 at 2000   insulin glargine (LANTUS) 100 UNIT/ML Solostar Pen  Inject 12 Units into the skin at bedtime. 9 mL 11 04/24/2023 at 2000   Prenatal Vit-Fe Fumarate-FA (M-NATAL PLUS) 27-1 MG TABS Take 1 tablet by mouth daily. 30 tablet 11 04/24/2023 at 2200   valACYclovir (VALTREX) 1000 MG tablet Take 1 tablet (1,000 mg total) by mouth 2 (two) times daily. (Patient taking differently: Take 1,000 mg by mouth daily.) 60 tablet 2 04/24/2023 at 2000   Accu-Chek Softclix Lancets lancets USE AS DIRECTED TO TEST BLOOD SUGARS 4 (FOUR) TIMES A DAY 100  each 12    ferrous sulfate 324 MG TBEC Take 1 tablet (324 mg total) by mouth every other day. (Patient not taking: Reported on 04/20/2023) 40 tablet 1 Not Taking   glucose blood (CONTOUR TEST) test strip Use as instructed 100 each 12    Insulin Pen Needle 32G X 4 MM MISC USE AS DIRECTED WITH INSULIN 100 each 5    polyethylene glycol powder (GLYCOLAX/MIRALAX) 17 GM/SCOOP powder Take 17 g by mouth daily as needed. (Patient not taking: Reported on 04/20/2023) 510 g 1 Not Taking    Allergies  Allergen Reactions   Contrast Media [Iodinated Contrast Media] Nausea And Vomiting    Review of Systems: Negative except for what is mentioned in HPI.  Physical Exam: BP 120/84   Pulse 91   Temp 98.3 F (36.8 C) (Oral)   Resp 18   Ht 5\' 2"  (1.575 m)   Wt 108.5 kg   LMP 08/09/2022   SpO2 98%   BMI 43.75 kg/m  FHR by Doppler: 141 bpm CONSTITUTIONAL: Well-developed, well-nourished female in no acute distress.  HENT:  Normocephalic, atraumatic, External right and left ear normal. Oropharynx is clear and moist EYES: Conjunctivae and EOM are normal. Pupils are equal, round, and reactive to light. No scleral icterus.  NECK: Normal range of motion, supple, no masses SKIN: Skin is warm and dry. No rash noted. Not diaphoretic. No erythema. No pallor. NEUROLGIC: Alert and oriented to person, place, and time. Normal reflexes, muscle tone coordination. No cranial nerve deficit noted. PSYCHIATRIC: Normal mood and affect. Normal behavior.  Normal judgment and thought content. CARDIOVASCULAR: Normal heart rate noted, regular rhythm RESPIRATORY: Effort and breath sounds normal, no problems with respiration noted ABDOMEN: Soft, nontender, nondistended, gravid. Well-healed Pfannenstiel incision. PELVIC: Deferred MUSCULOSKELETAL: Normal range of motion. No edema and no tenderness. 2+ distal pulses.   Pertinent Labs/Studies:   Results for orders placed or performed during the hospital encounter of 04/25/23 (from the past 72 hour(s))  ABO/Rh     Status: None   Collection Time: 04/25/23  5:00 AM  Result Value Ref Range   ABO/RH(D)      AB POS Performed at [facility], [address], [place], [place] [address]   Glucose, capillary     Status: Abnormal   Collection Time: 04/25/23  8:02 AM  Result Value Ref Range   Glucose-Capillary 116 (H) 70 - 99 mg/dL    Comment: Glucose reference range applies only to samples taken after fasting for at least 8 hours.    Assessment and Plan :KIYANA VAZGUEZ is a 37 y.o. G3P2002 at [redacted]w[redacted]d being admitted for scheduled repeat cesarean section for suboptimally controlled gestational diabetes mellitus, on insulin. Last growth [place] >99%tile. Recommended for 37 weeks delivery given poorly controlled gDM.  The risks of cesarean section were discussed with the patient; including but not limited to: infection which may require antibiotics; bleeding which may require transfusion or re-operation; injury to bowel, bladder, ureters or other surrounding organs; need for additional procedures including hysterectomy in the event of a life-threatening hemorrhage; placental abnormalities wth subsequent pregnancies,  risk of needing c-sections in future pregnancies, incisional problems, thromboembolic phenomenon and other postoperative/anesthesia complications. Answered all questions. The patient verbalized understanding of the plan, giving informed consent for the procedure. She is agreeable to blood  transfusion in the event of emergency.  Patient has known history of HIV, well controlled on monthly cabenuva injections. VL has been undetectable throughout pregnancy. With undetectable viral load, will  not give pre-op course of zidovudine, per ACOG and UTD recommendations, patient is agreeable to this. She understands transmission risk to baby is low but not zero. Discussed with neonatologist on call and they are in agreement. Also with history of MS, on no meds. H/o HSV on valtrex, reports last outbreak was in 11/2022 and has been compliant with meds.  No other issues today.  Patient has been NPO since 12 am, she will remain NPO for procedure Anesthesia and OR aware Preoperative prophylactic antibiotics and SCDs ordered on call to the OR  To OR when ready   K. Therese Sarah, M.D. Attending Obstetrician & Gynecologist, [facility] for [employer], [facility]

## 2023-04-26 LAB — CBC
HCT: 27.8 % — ABNORMAL LOW (ref 36.0–46.0)
Hemoglobin: 8.9 g/dL — ABNORMAL LOW (ref 12.0–15.0)
MCH: 26.8 pg (ref 26.0–34.0)
MCHC: 32 g/dL (ref 30.0–36.0)
MCV: 83.7 fL (ref 80.0–100.0)
Platelets: 303 10*3/uL (ref 150–400)
RBC: 3.32 MIL/uL — ABNORMAL LOW (ref 3.87–5.11)
RDW: 17.9 % — ABNORMAL HIGH (ref 11.5–15.5)
WBC: 18.3 10*3/uL — ABNORMAL HIGH (ref 4.0–10.5)
nRBC: 0 % (ref 0.0–0.2)

## 2023-04-26 LAB — GLUCOSE, CAPILLARY: Glucose-Capillary: 112 mg/dL — ABNORMAL HIGH (ref 70–99)

## 2023-04-26 NOTE — Lactation Note (Signed)
This note was copied from a baby's chart. Lactation Consultation Note  Patient Name: Candice [name] [id number] Date: 04/26/2023 Age:39 hours  Reason for consult: Follow-up assessment;Early term 37-38.6wks;Breast reduction  P3, [redacted]w[redacted]d, 2% weight loss  Mother states she has been breastfeeding baby for 5 minutes prior to formula feeding. She shares baby has been latching well and she hears her swallow colostrum. Mother states she has not had a chance to pump and to pump later. Infant is formula feeding up to 60 ml.   Discussed the process of milk production, "supply and demand" and the importance of breast stimulation and milk removal in order to make an optimal milk supply.  Discussed mother to breastfeed 8-12 times in 24 hours, skin to skin and breast feed before formula feeding.  If missed feedings at breast or substituting feeding with formula, advised to hand express and/or pump to remove milk from the breast.  Call for assistance with breastfeeding or pumping. .   Feeding Mother's Current Feeding Choice: Breast Milk and Formula Nipple Type: Slow - flow   Lactation Tools Discussed/Used Reason for Pumping: breast reduction Pumping frequency: encouraged to pump every 3 hours for stimulation of milk production Pumped volume: 0 mL  Interventions Interventions: Education  Consult Status Consult Status: Follow-up Date: 04/27/23 Follow-up type: In-patient    Christella Hartigan M 04/26/2023, 6:20 PM

## 2023-04-26 NOTE — Progress Notes (Signed)
POSTPARTUM PROGRESS NOTE  POD #1  Subjective:  Candice [name] is a 37 y.o. G3P3003 s/p rLTCS at [redacted]w[redacted]d. No acute events overnight. She reports she is doing well. She denies any problems with ambulating, voiding or po intake. Denies nausea or vomiting. She has passed flatus. Pain is moderately controlled.  Lochia is appropriate.  Objective: Blood pressure 121/72, pulse 62, temperature (!) 97.4 F (36.3 C), temperature source Oral, resp. rate 18, height 5\' 2"  (1.575 m), weight 108.5 kg, last menstrual period 08/09/2022, SpO2 100%, unknown if currently breastfeeding.  Physical Exam:  General: alert, cooperative and no distress Chest: no respiratory distress Heart: regular rate, distal pulses intact Uterine Fundus: firm, appropriately tender DVT Evaluation: No calf swelling or tenderness Extremities: trace edema Skin: warm, dry; incision clean/dry/intact w/ honeycomb dressing and provina wound vac in place  Recent Labs    04/26/23 0500  HGB 8.9*  HCT 27.8*    Assessment/Plan: Candice [name] is a 37 y.o. [id number] s/p repeat cesarean section for suboptimally controlled gestational diabetes mellitus, on insulin. Last growth [place] >99%tile. Recommended for 37 weeks delivery given poorly controlled gDM.   POD#1 - Doing welll; pain moderately well controlled. H/H appropriate  Routine postpartum care  OOB, ambulated  Lovenox for VTE prophylaxis Acute blood loss anemia: asymptomatic  Start po ferrous sulfate every other day  Contraception: declined Feeding: Formula  Dispo: Plan for discharge 11/4.   LOS: 1 day   Wyn Forster, MD OB Fellow  04/26/2023, 8:45 AM

## 2023-04-27 ENCOUNTER — Encounter (HOSPITAL_COMMUNITY): Payer: Self-pay | Admitting: Obstetrics and Gynecology

## 2023-04-27 MED ORDER — NIFEDIPINE ER OSMOTIC RELEASE 30 MG PO TB24
30.0000 mg | ORAL_TABLET | Freq: Once | ORAL | Status: AC
  Administered 2023-04-27: 30 mg via ORAL
  Filled 2023-04-27: qty 1

## 2023-04-27 MED ORDER — NIFEDIPINE ER OSMOTIC RELEASE 30 MG PO TB24: 60.0000 mg | ORAL_TABLET | Freq: Every day | ORAL | Status: DC

## 2023-04-27 MED ORDER — NIFEDIPINE ER OSMOTIC RELEASE 30 MG PO TB24
30.0000 mg | ORAL_TABLET | Freq: Every day | ORAL | Status: DC
  Administered 2023-04-27: 30 mg via ORAL
  Filled 2023-04-27: qty 1

## 2023-04-27 NOTE — Clinical Social Work Maternal (Signed)
CLINICAL SOCIAL WORK MATERNAL/CHILD NOTE  Patient Details  Name: Candice [name] MRN: [id number] Date of Birth: 05/17/1986  Date:  04/27/2023  Clinical Social Worker Initiating Note:  Enos Fling Date/Time: Initiated:  04/27/23/1521     Child's Name:  Candice [name]   Biological Parents:  Mother Candice [name] 10-Mar-1986)   Need for Interpreter:  None   Reason for Referral:  Newly Diagnosed HIV, Behavioral Health Concerns, Current Substance Use/Substance Use During Pregnancy     Address:  [address] [place] [address]    Phone number:  [phone] (home)     Additional phone number:   Household Members/Support Persons (HM/SP):   Household Member/Support Person 1, Household Member/Support Person 2   HM/SP Name Relationship DOB or Age  HM/SP -1 Si'Mya Teague-Derk MOB's daughter 07-25-2007  HM/SP -2 Zylin Teague-Wessler MOB' son 09-07-2011  HM/SP -3        HM/SP -4        HM/SP -5        HM/SP -6        HM/SP -7        HM/SP -8          Natural Supports (not living in the home):  Immediate Family, Children   Professional Supports: None   Employment: Environmental education officer   Type of Work: Landscape architect- Librarian, academic:  Engineer, maintenance (IT)   Homebound arranged:    Surveyor, quantity Resources:  Media planner    Other Resources:  [employer], Sales executive     Cultural/Religious Considerations Which May Impact Care:    Strengths:  Ability to meet basic needs  , Merchandiser, retail, Home prepared for child  , Understanding of illness   Psychotropic Medications:         Pediatrician:    Armed forces operational officer area  Optometrist List:   Proofreader)  [place]      Pediatrician Fax Number:    Risk Factors/Current Problems:  Mental Health Concerns  , Substance Use   (HIV exposed newborn)   Cognitive State:  Able to Concentrate  , Alert  , Goal Oriented  , Insightful  , Linear Thinking      Mood/Affect:  Interested  , Comfortable  , Calm  , Relaxed     CSW Assessment: CSW received a consult for HIV exposed newborn, Edinburgh score of 11, THC use, anxiety and depression. CSW met MOB at bedside to complete a full psychosocial assessment and offer support. CSW entered the room, introduced herself and acknowledged that her children were present. CSW asked MOB for privacy could the children step out for the assessment; MOB was agreeable and the children stepped out. CSW explained her role and the reason for the assessment. MOB presented holding/bonding with the infant; and was polite, easy to engage, receptive to meeting with CSW, and appeared forthcoming.  CSW acknowledged Edinburgh score of 11 and listened to MOB explore her feelings about transitioning into motherhood. Patient declines a referral to [facility]. Patient verbalizes understanding that the appointment will be virtual. CSW collected MOB's demographic information and inquired about her mental health history. MOB reported being diagnosed with depression in 2010 due to being removed from her parents and placed with family. MOB reported about every 10 years, she would experience a depression episode and in 2015 she was hospitalized for support. MOB reported since her hospitalization she understands her  triggers and is able to cope. MOB denied participating in therapy and being prescribed medication for support. MOB denied needing therapy resources at this time and reported her mental health as stable. CSW provided education regarding the baby blues period vs. perinatal mood disorders, discussed treatment and gave resources for mental health follow up if concerns arise.  CSW recommends self-evaluation during the postpartum time period using the New Mom Checklist from Postpartum Progress and encouraged MOB to contact a medical professional if symptoms are noted at any time.  CSW assessed for safety with MOB SI/HI/DV;MOB  denied all.  CSW informed MOB due to [facility] during her pregnancy; the hospital will perform a UDS and CDS on the infant. If the screenings return with positive results a report to CPS will be made; MOB was understanding.  MOB reported her last use was in April 2024 and prior to pregnancy she used [facility] for a long time, about 3-4 times a week. MOB reported she used THC due to being sick during pregnancy, depression and anxiety support.  CSW asked MOB about her HIV diagnoses. MOB reported being diagnosed in 2021. MOB reported FOB is aware of her diagnoses and possibly the reason for his behavior during pregnancy and currently. MOB reported wanting a follow up visit with [facility]; CSW has completed the follow up appointment which will be December 13th 2024 at 3:40pm with Dr.Shetty. MOB reported currently taking Cabenuva injectable once a month and plans to continue to be followed by her physicians with [facility].  CSW asked MOB does she receive support resources; MOB said yes to receiving [facility] and will reapply for foodstamps once discharge.  MOB reported having all essential items for the infant including a carseat, bassinet and crib for safe sleeping. CSW provided review of Sudden Infant Death Syndrome (SIDS) precautions.    CSW Plan/Description:     No Further Intervention Required/No Barriers to Discharge, Sudden Infant Death Syndrome (SIDS) Education, Perinatal Mood and Anxiety Disorder (PMADs) Education, Hospital Drug Screen Policy Information, Other Information/Referral to [facility], CSW Will Continue to Monitor Umbilical Cord Tissue Drug Screen Results and Make Report if Joanie Coddington, LCSW 04/27/2023, 3:25 PM

## 2023-04-27 NOTE — Progress Notes (Signed)
Notified about consistently elevated blood pressures, asymptotic.  On procardia XL 30mg  and Lasix 40mg  already.  One dose Procardia 30 ER now then increased procardia XL to 60mg  tomorrow. Nurse notified and agreeable with plan.   Dr. Judd Lien

## 2023-04-27 NOTE — Progress Notes (Addendum)
POSTPARTUM PROGRESS NOTE  Subjective: Candice [name] is a 37 y.o. G3P3003 s/p rLTCS at [redacted]w[redacted]d.  She reports she is doing well. No acute events overnight. She denies any problems with ambulating, voiding or po intake. Denies nausea or vomiting. She has passed flatus. She has had bowel movement. Pain is moderately controlled. Lochia is Small.  No headache, vision changes, and RUQ pain. No dizziness, dyspnea, and chest palpitations.  Objective: BP (!) 142/80 (BP Location: Left Arm)   Pulse 88   Temp 98.1 F (36.7 C) (Oral)   Resp 15   Ht 5\' 2"  (1.575 m)   Wt 108.5 kg   LMP 08/09/2022   SpO2 98%   Breastfeeding Unknown   BMI 43.75 kg/m   Physical Exam:  General: alert, cooperative and no distress Chest: no respiratory distress Abdomen: soft, non-tender  incision dry, wound vac well-applied  Uterine Fundus: firm, appropriately tender Extremities: No calf swelling or tenderness  no peripheral edema  Recent Labs    04/26/23 0500  HGB 8.9*  HCT 27.8*    Assessment/Plan: Candice [name] is a 37 y.o. G3P3003 s/p rLTCS at [redacted]w[redacted]d for poorly controlled gDM and LGA.  POD#2: Doing well, pain well-controlled. H/H appropriate.  -- Routine postpartum care, lactation support -- Encouraged up OOB -- Lovenox for VTE prophylaxis -- Contraception: no method -- Feeding: breast feeding and bottle feeding -- Circumcision: N/A  PP HTN: Already on Lasix 40 mg daily, BPs still mild range.  Asymptomatic.  Added nifedipine 30 mg daily. Acute blood loss anemia: Hgb 8.9 yesterday, clinically significant but remains asymptomatic.  Continuing PO ferrous sulfate.  Dispo: Plan for discharge tomorrow.  Candice Sheerin Sharion Dove, MD Tania Ade PGY-1 [facility] 04/27/23 7:26 AM

## 2023-04-27 NOTE — Lactation Note (Addendum)
This note was copied from a baby's chart. Lactation Consultation Note  Patient Name: Candice [name] Date: 04/27/2023 Age:39 hours Reason for consult: Follow-up assessment;Maternal endocrine disorder;Breast reduction;Early term 37-38.6wks  P3, Baby 37 weeks.  Mother HIV+ with undetectable viral load.  Mother has chosen to breastfeed, pump and supplement with formula.  Baby recently breastfed for 5 min and was supplemented with formula. Mother states she plans to pump later.  Recommend pumping q 3 hours to help mother establish her milk supply.   Maternal Data Has patient been taught Hand Expression?: Yes  Feeding Mother's Current Feeding Choice: Breast Milk and Formula Nipple Type: Slow - flow  Lactation Tools Discussed/Used Tools: Pump;Flanges Flange Size: 18 Breast pump type: Double-Electric Breast Pump Pumping frequency: q 3 hours for 15 min  Interventions Interventions: Education Consult Status Consult Status: Follow-up Date: 04/28/23 Follow-up type: In-patient    Dahlia Byes [facility] 04/27/2023, 11:55 AM

## 2023-04-28 ENCOUNTER — Other Ambulatory Visit (HOSPITAL_COMMUNITY): Payer: Self-pay

## 2023-04-28 DIAGNOSIS — O139 Gestational [pregnancy-induced] hypertension without significant proteinuria, unspecified trimester: Secondary | ICD-10-CM | POA: Insufficient documentation

## 2023-04-28 MED ORDER — SENNOSIDES-DOCUSATE SODIUM 8.6-50 MG PO TABS
2.0000 | ORAL_TABLET | Freq: Two times a day (BID) | ORAL | 0 refills | Status: DC | PRN
  Filled 2023-04-28: qty 30, 8d supply, fill #0

## 2023-04-28 MED ORDER — OXYCODONE HCL 5 MG PO TABS
5.0000 mg | ORAL_TABLET | ORAL | 0 refills | Status: AC | PRN
  Filled 2023-04-28: qty 10, 2d supply, fill #0

## 2023-04-28 MED ORDER — NIFEDIPINE ER OSMOTIC RELEASE 30 MG PO TB24
90.0000 mg | ORAL_TABLET | Freq: Every day | ORAL | Status: DC
  Administered 2023-04-28: 90 mg via ORAL
  Filled 2023-04-28: qty 3

## 2023-04-28 MED ORDER — FUROSEMIDE 40 MG PO TABS
40.0000 mg | ORAL_TABLET | Freq: Every day | ORAL | 0 refills | Status: DC
  Filled 2023-04-28: qty 5, 5d supply, fill #0

## 2023-04-28 MED ORDER — NIFEDIPINE ER 90 MG PO TB24
90.0000 mg | ORAL_TABLET | Freq: Every day | ORAL | 0 refills | Status: DC
  Filled 2023-04-28: qty 60, 60d supply, fill #0

## 2023-04-28 NOTE — Lactation Note (Signed)
This note was copied from a baby's chart. Lactation Consultation Note  Patient Name: Candice [name] RUEAV'W Date: 04/28/2023 Age:39 hours Reason for consult: Follow-up assessment;Infant weight loss;Early term 37-38.6wks;Breast reduction (5 % weight loss. LC reviewed and updated the doc flow sheets per mom) Per mom feels like her milk is coming in , hearing more swallows and could hand express drops after the baby released.  Has pumped with the DEBP x 2 in the last 24 hours.  LC reviewed BF D/C teaching and the [facility] resources.  Due to moms hx of a breast reduction in 2019 - LC recommended breast feeding 1st and the supplementing, post pump both breast for the extra stimulation and save the milk for the next feeding.  Next feeding latch on the other breast and do the same.  LC offered to request and LC O/P appt and mom receptive.  Mom aware she will receive a call from [facility] at [facility].  Maternal Data Has patient been taught Hand Expression?: Yes Does the patient have breastfeeding experience prior to this delivery?: Yes How long did the patient breastfeed?: per mom 3 months  Feeding Mother's Current Feeding Choice: Breast Milk and Formula Nipple Type: Slow - flow  LATCH Score - baby sound asleep. Has fed in the last 2 hours   Lactation Tools Discussed/Used Tools: Pump;Flanges Flange Size: 18 Breast pump type: Double-Electric Breast Pump;Manual;Other (comment) (LC reviewed the hand pump for D/C) Pumping frequency: per mom x 2 in the last 24 hours  Interventions Interventions: Breast feeding basics reviewed;DEBP;Hand pump;Education;LC Services brochure  Discharge Discharge Education: Engorgement and breast care Pump: Hands Free;Personal;Manual  Consult Status Consult Status: Complete Date: 04/28/23    [name] 04/28/2023, 8:23 AM

## 2023-04-29 ENCOUNTER — Other Ambulatory Visit: Payer: Self-pay

## 2023-05-05 ENCOUNTER — Other Ambulatory Visit: Payer: Self-pay

## 2023-05-05 ENCOUNTER — Ambulatory Visit (INDEPENDENT_AMBULATORY_CARE_PROVIDER_SITE_OTHER): Payer: BC Managed Care – PPO

## 2023-05-05 VITALS — BP 126/83 | HR 87 | Wt 216.8 lb

## 2023-05-05 DIAGNOSIS — Z0130 Encounter for examination of blood pressure without abnormal findings: Secondary | ICD-10-CM

## 2023-05-05 DIAGNOSIS — Z5189 Encounter for other specified aftercare: Secondary | ICD-10-CM

## 2023-05-05 NOTE — Progress Notes (Signed)
Incision and Blood Pressure Check Visit  Candice [name] is here for incision check following repeat c-section on 04/25/23. Provena removed. Incision appears clean and dry. Open areas of incision appear pink and well healing. Pain is well controlled. Reviewed good wound care and s/s of infection with patient.  Diagnosis of gestational hypertension. Reports completing Lasix 20 mg daily x 5 day course. Taking nifedipine 90 mg daily as prescribed; last taken this AM. BP today is 126/83. Will continue meds as prescribed and follow up at [facility] visit on 06/10/23.  Marjo Bicker, RN 05/05/2023  2:35 PM

## 2023-05-07 ENCOUNTER — Other Ambulatory Visit (HOSPITAL_COMMUNITY): Payer: Self-pay

## 2023-05-07 ENCOUNTER — Other Ambulatory Visit: Payer: Self-pay

## 2023-05-07 ENCOUNTER — Other Ambulatory Visit: Payer: Self-pay | Admitting: Pharmacist

## 2023-05-07 DIAGNOSIS — Z21 Asymptomatic human immunodeficiency virus [HIV] infection status: Secondary | ICD-10-CM

## 2023-05-07 MED ORDER — CABOTEGRAVIR & RILPIVIRINE ER 600 & 900 MG/3ML IM SUER
1.0000 | INTRAMUSCULAR | 5 refills | Status: DC
  Filled 2023-05-07: qty 6, 60d supply, fill #0
  Filled 2023-07-13: qty 6, 60d supply, fill #1
  Filled 2023-08-31: qty 6, 60d supply, fill #2
  Filled 2023-10-22: qty 6, 60d supply, fill #3
  Filled 2023-12-28: qty 6, 60d supply, fill #4
  Filled 2024-03-07: qty 6, 60d supply, fill #5

## 2023-05-07 NOTE — Progress Notes (Signed)
Specialty Pharmacy Refill Coordination Note  Candice [name] is a 37 y.o. female assessed today regarding refills of clinic administered specialty medication(s) Cabotegravir & Rilpivirine   Clinic requested Courier to Provider Office   Delivery date: 05/12/23   Verified address: [facility] [address] [place] [address]   Medication will be filled on 05/11/23.

## 2023-05-11 ENCOUNTER — Encounter: Payer: Self-pay | Admitting: Pharmacist

## 2023-05-11 ENCOUNTER — Other Ambulatory Visit (HOSPITAL_COMMUNITY): Payer: Self-pay

## 2023-05-12 ENCOUNTER — Telehealth: Payer: Self-pay

## 2023-05-12 NOTE — Telephone Encounter (Signed)
[name]  Patient's medications CABENUVA have been couriered to [facility] from [facility] pharmacy and will be administered at the patients appointment on 05/14/23.  Kae Heller, CPhT Specialty Pharmacy Patient [facility] for Infectious Disease Phone: [phone] Fax:  [phone]

## 2023-05-13 NOTE — Progress Notes (Unsigned)
HPI: Candice [name] is a 37 y.o. female who presents to the [facility] pharmacy clinic for [place] administration.  Patient Active Problem List   Diagnosis Date Noted   Gestational hypertension 04/28/2023   Status post repeat low transverse cesarean section 04/25/2023   Tobacco use affecting pregnancy in third trimester, antepartum 03/31/2023   Elevated blood pressure reading in office without diagnosis of hypertension 03/17/2023   History of reversal of tubal ligation 03/17/2023   GDM (gestational diabetes mellitus) 02/11/2023   Pregnancy with history of cesarean section, antepartum 01/21/2023   Alpha thalassemia silent carrier 11/28/2022   Supervision of high risk pregnancy, antepartum 11/10/2022   History of depression 01/13/2022   MS (multiple sclerosis) (HCC) 05/21/2020   HIV-1 (human immunodeficiency virus I) (HCC) 09/07/2019   Genital herpes 02/16/2013   OBESITY, NOS 08/20/2006    Patient's Medications  New Prescriptions   No medications on file  Previous Medications   ACETAMINOPHEN (TYLENOL) 500 MG TABLET    Take 500 mg by mouth every 6 (six) hours as needed for mild pain.   CABOTEGRAVIR & RILPIVIRINE ER (CABENUVA) 600 & 900 MG/3ML INJECTION    Inject 1 kit into the muscle every 2 (two) months.   CHOLECALCIFEROL (VITAMIN D) 125 MCG (5000 UT) CAPS    Take 5,000 Units by mouth daily.   FERROUS SULFATE 324 MG TBEC    Take 1 tablet (324 mg total) by mouth every other day.   NIFEDIPINE (ADALAT CC) 90 MG 24 HR TABLET    Take 1 tablet (90 mg total) by mouth daily.   PRENATAL VIT-FE FUMARATE-FA (M-NATAL PLUS) 27-1 MG TABS    Take 1 tablet by mouth daily.   SENNA-DOCUSATE (SENOKOT-S) 8.6-50 MG TABLET    Take 2 tablets by mouth 2 (two) times daily as needed for mild constipation.  Modified Medications   No medications on file  Discontinued Medications   No medications on file    Allergies: Allergies  Allergen Reactions   Contrast Media [Iodinated Contrast Media] Nausea And  Vomiting    Labs: Lab Results  Component Value Date   HIV1RNAQUANT Not Detected 04/10/2023   HIV1RNAQUANT Not Detected 03/13/2023   HIV1RNAQUANT Not Detected 02/13/2023   CD4TABS 684 09/11/2022   CD4TABS 1,026 11/22/2020   CD4TABS 1,078 03/29/2020    RPR and STI Lab Results  Component Value Date   LABRPR NON REACTIVE 04/23/2023   LABRPR Non Reactive 02/06/2023   LABRPR Non Reactive 09/17/2022   LABRPR NON-REACTIVE 12/13/2021   LABRPR NON-REACTIVE 06/20/2021    STI Results GC CT  04/14/2023  3:14 PM Negative  Negative   09/17/2022 10:00 AM Negative  Negative   08/29/2022  3:43 PM Negative  Negative   02/14/2022  9:13 AM Negative    Negative  Negative    Negative   12/13/2021  9:09 AM Negative    Negative  Negative    Negative   07/29/2021  4:28 PM Negative  Negative   06/20/2021  9:20 AM Negative  Negative   12/21/2020  3:45 PM Negative  Negative   11/22/2020  9:58 AM Negative  Negative   10/25/2020  9:52 AM Negative  Negative   09/05/2019  4:31 PM Negative  Negative   10/16/2017 12:00 AM Negative  Negative   06/08/2017 12:00 AM Negative  Negative   10/14/2016 12:00 AM Negative  Negative   02/15/2016 12:00 AM Negative  Negative   07/13/2015 12:00 AM Negative  Negative   04/13/2015 12:00 AM Negative  Negative   09/24/2013 12:00 AM NG: Negative  CT: Negative   02/11/2011  3:36 PM  NEGATIVE   01/29/2010  7:00 PM  NEGATIVE     Hepatitis B Lab Results  Component Value Date   HEPBSAB REACTIVE (A) 09/08/2019   HEPBSAG Negative 09/17/2022   HEPBCAB NON-REACTIVE 09/08/2019   Hepatitis C Lab Results  Component Value Date   HEPCAB NON-REACTIVE 09/08/2019   Hepatitis A Lab Results  Component Value Date   HAV BORDERLINE (A) 09/08/2019   Lipids: Lab Results  Component Value Date   CHOL 183 11/22/2020   TRIG 80 11/22/2020   HDL 51 11/22/2020   CHOLHDL 3.6 11/22/2020   VLDL 10 02/14/2013   LDLCALC 115 (H) 11/22/2020    TARGET DATE: The 25th of the  month  Assessment: [name] presents today for their maintenance Cabenuva injections. Past injections were tolerated well without issues. She had her C-section on 11/02, and remains on every month Cabenuva dosing. Will plan to follow up with Tammy Sours in 2 months to transition back to every other month dosing.  Immunizations: Due for influenza, COVID, HPV 1/3 today. She *** these today.  Administered cabotegravir 600mg /66mL in left upper outer quadrant of the gluteal muscle. Administered rilpivirine 900 mg/40mL in the right upper outer quadrant of the gluteal muscle. No issues with injections. *** will follow up in 2 months for next set of injections.  Plan: - Cabenuva injections administered - Next injections scheduled for *** - See Greg in 2 months - Call with any issues or questions  Lora Paula, PharmD PGY-2 Infectious Diseases Pharmacy Resident [facility] for Infectious Disease

## 2023-05-14 ENCOUNTER — Ambulatory Visit (INDEPENDENT_AMBULATORY_CARE_PROVIDER_SITE_OTHER): Payer: BC Managed Care – PPO | Admitting: Pharmacist

## 2023-05-14 ENCOUNTER — Other Ambulatory Visit: Payer: Self-pay

## 2023-05-14 DIAGNOSIS — Z23 Encounter for immunization: Secondary | ICD-10-CM | POA: Diagnosis not present

## 2023-05-14 DIAGNOSIS — B20 Human immunodeficiency virus [HIV] disease: Secondary | ICD-10-CM | POA: Diagnosis not present

## 2023-05-14 MED ORDER — CABOTEGRAVIR & RILPIVIRINE ER 600 & 900 MG/3ML IM SUER
1.0000 | Freq: Once | INTRAMUSCULAR | Status: AC
  Administered 2023-05-14: 1 via INTRAMUSCULAR

## 2023-05-18 LAB — HIV-1 RNA QUANT-NO REFLEX-BLD
HIV 1 RNA Quant: NOT DETECTED {copies}/mL
HIV-1 RNA Quant, Log: NOT DETECTED {Log}

## 2023-06-10 ENCOUNTER — Other Ambulatory Visit: Payer: Self-pay

## 2023-06-10 ENCOUNTER — Ambulatory Visit (INDEPENDENT_AMBULATORY_CARE_PROVIDER_SITE_OTHER): Payer: BC Managed Care – PPO | Admitting: Obstetrics & Gynecology

## 2023-06-10 ENCOUNTER — Encounter: Payer: Self-pay | Admitting: Obstetrics & Gynecology

## 2023-06-10 DIAGNOSIS — Z21 Asymptomatic human immunodeficiency virus [HIV] infection status: Secondary | ICD-10-CM

## 2023-06-10 DIAGNOSIS — Z8632 Personal history of gestational diabetes: Secondary | ICD-10-CM

## 2023-06-10 DIAGNOSIS — G35 Multiple sclerosis: Secondary | ICD-10-CM

## 2023-06-10 DIAGNOSIS — G35D Multiple sclerosis, unspecified: Secondary | ICD-10-CM

## 2023-06-10 DIAGNOSIS — Z8759 Personal history of other complications of pregnancy, childbirth and the puerperium: Secondary | ICD-10-CM

## 2023-06-10 NOTE — Progress Notes (Signed)
Post Partum Visit Note  Candice [name] is a 37 y.o. [id number] female who presents for a postpartum visit. She is 6.4 weeks postpartum following a repeat cesarean section.  I have fully reviewed the prenatal and intrapartum course; patient had insulin controlled A2GDM, GHTN.   Known history of multiple sclerosis and HIV. The delivery was at 37 gestational weeks.  Anesthesia: epidural. Postpartum course has been decent. Baby is doing well. Baby is feeding by bottle - Similac Advance. Bleeding no bleeding. Bowel function is normal. Bladder function is normal. Patient is not sexually active. Contraception method is none. Postpartum depression screening: negative.   Upstream - 06/10/23 1331       Pregnancy Intention Screening   Does the patient want to become pregnant in the next year? No    Does the patient's partner want to become pregnant in the next year? N/A    Would the patient like to discuss contraceptive options today? No      Contraception Wrap Up   Current Method No Contraceptive Precautions           The pregnancy intention screening data noted above was reviewed. Potential methods of contraception were discussed. The patient elected to proceed with No data recorded.  Edinburgh Postnatal Depression Scale - 06/10/23 1330       Edinburgh Postnatal Depression Scale:  In the Past 7 Days   I have been able to laugh and see the funny side of things. 0    I have looked forward with enjoyment to things. 0    I have blamed myself unnecessarily when things went wrong. 2    I have been anxious or worried for no good reason. 0    I have felt scared or panicky for no good reason. 1    Things have been getting on top of me. 1    I have been so unhappy that I have had difficulty sleeping. 0    I have felt sad or miserable. 0    I have been so unhappy that I have been crying. 0    The thought of harming myself has occurred to me. 0    Edinburgh Postnatal Depression Scale Total 4              Health Maintenance Due  Topic Date Due   COVID-19 Vaccine (1) Never done   The following portions of the patient's history were reviewed and updated as appropriate: allergies, current medications, past family history, past medical history, past social history, past surgical history, and problem list.  Review of Systems Pertinent items noted in HPI and remainder of comprehensive ROS otherwise negative.  Objective:  BP 115/80   Pulse 86   Wt 215 lb (97.5 kg)   LMP 06/04/2023 (Approximate)   Breastfeeding No   BMI 39.32 kg/m    General:  alert and no distress   Breasts:  not indicated  Lungs: clear to auscultation bilaterally  Heart:  regular rate and rhythm  Abdomen: soft, non-tender; bowel sounds normal; no masses,  no organomegaly   Wound well approximated incision, well-healed  GU exam:  not indicated       Assessment:   Postpartum care following cesarean delivery  Plan:   Essential components of care per ACOG recommendations:  1.  Mood and well being: Patient with negative depression screening today. Reviewed local resources for support.  - Patient tobacco use? No.   - hx of drug use? No.    2.  Infant care and feeding:  -Patient currently breastmilk feeding? No.  -Social determinants of health (SDOH) reviewed in EPIC. No concerns.  3. Sexuality, contraception and birth spacing - Patient does not want a pregnancy in the next year.  Desired family size is 3 children.  - Reviewed reproductive life planning. Reviewed contraceptive methods based on pt preferences and effectiveness.  Patient desired No Method - No Contraceptive Precautions today.   - Discussed birth spacing of 18 months  4. Sleep and fatigue -Encouraged family/partner/community support of 4 hrs of uninterrupted sleep to help with mood and fatigue  5. Physical Recovery  - Discussed patients delivery and complications.  - Patient had a C-section repeat; no problems after delivery. Wound  healing reviewed. Patient expressed understanding - Patient has urinary incontinence? No. - Patient is safe to resume physical and sexual activity  6.  Health Maintenance - HM due items addressed Yes - Last pap smear  Diagnosis  Date Value Ref Range Status  10/25/2020   Final   - Negative for intraepithelial lesion or malignancy (NILM)   Pap smear not done at today's visit.  -Breast Cancer screening indicated? No.   7. Chronic Disease/Pregnancy Condition follow up:  #History of insulin controlled gestational diabetes mellitus: Postpartum 2 hr GTT to be scheduled # History of gestational hypertension: Stable BP off meds, follow up with PCP #HIV-1 (human immunodeficiency virus I) (HCC): Already being seen by ID #MS (multiple sclerosis) [facility]): Upcoming appointment with Neurologist next month   Jaynie Collins, MD [facility] for [employer], [facility]

## 2023-06-19 ENCOUNTER — Other Ambulatory Visit: Payer: Self-pay

## 2023-06-19 ENCOUNTER — Other Ambulatory Visit: Payer: BC Managed Care – PPO

## 2023-06-19 DIAGNOSIS — O24429 Gestational diabetes mellitus in childbirth, unspecified control: Secondary | ICD-10-CM

## 2023-06-20 LAB — GLUCOSE TOLERANCE, 2 HOURS
Glucose, 2 hour: 98 mg/dL (ref 70–139)
Glucose, GTT - Fasting: 91 mg/dL (ref 70–99)

## 2023-06-25 ENCOUNTER — Encounter: Payer: Self-pay | Admitting: *Deleted

## 2023-06-25 ENCOUNTER — Encounter: Payer: Self-pay | Admitting: Obstetrics and Gynecology

## 2023-07-06 ENCOUNTER — Encounter: Payer: Self-pay | Admitting: Neurology

## 2023-07-07 ENCOUNTER — Encounter: Payer: Self-pay | Admitting: Neurology

## 2023-07-13 ENCOUNTER — Other Ambulatory Visit (HOSPITAL_COMMUNITY): Payer: Self-pay

## 2023-07-13 ENCOUNTER — Other Ambulatory Visit: Payer: Self-pay

## 2023-07-13 NOTE — Progress Notes (Signed)
Specialty Pharmacy Refill Coordination Note  Candice [name] is a 38 y.o. female assessed today regarding refills of clinic administered specialty medication(s) Cabotegravir & Rilpivirine [facility])   Clinic requested Courier to Provider Office   Delivery date: 07/16/23   Verified address: [address] [place] [address]   Medication will be filled on 07/15/23.

## 2023-07-14 ENCOUNTER — Other Ambulatory Visit (HOSPITAL_COMMUNITY): Payer: Self-pay

## 2023-07-14 NOTE — Progress Notes (Deleted)
NEUROLOGY FOLLOW UP OFFICE NOTE  Candice [name] [id number]  Assessment/Plan:   Relapsing-remitting multiple sclerosis Asymptomatic HIV, well-controlled    Although her HIV is well-controlled, it would still be best to avoid anti-CD20 monoclonal antibodies.   DMT:  *** start Rebif *** Continue D3 5000 IU daily.  Check vit D level today. Follow up about one week after repeat MRIs.       Subjective:  Candice [name] is a 38 year old right-handed female at [redacted] weeks gestation with HIV and migraines who follows up regarding multiple sclerosis.     UPDATE: Current DMT:  none Current medications:  Cabenuva (HIV), acetaminophen, D3 5000 IU daily  She gave birth to *** on 11/2 via Cesarean section.  ***  Overall doing well Vision:  No issues Motor:  No weakness Sensory:  none.   Pain:  As per motor, some back pain and left sided arm muscle spasms.  Underwent PT and OMT which helped with back pain.  She saw Sports Medicine and found to have rotator cuff problem.   Gait:  okay Bowel/Bladder:  constipation.   Fatigue:  Improved. Cognition: Improved Mood:  good. Intermittent left-sided head pressure along w             ith intermittent blurred vision. Sleep:  Poor.  Trouble staying asleep.  Goes to bed at 9-10 PM.  Wakes up at 3 AM and cannot fall back asleep.     HISTORY: Beginning around 2018, she began having intermittent episodes of numbness, weakness and dizziness.  She was diagnosed with HIV in March 2021 and started on Calipatria.  In May or June of that year, she began having a recurrence of symptoms, including right flank pain and numbness radiating down her right leg with some associated right leg weakness.  She felt off balance.  She saw neurology at that time.  MRI of thoracic spine revealed subtle enhancing hyperintense focus at the T8-T9 region.  Follow up MRI of brain revealed multiple scattered patchy nonenhancing hyperintense lesions within the cerebral white  matter.She underwent lumbar puncture in November 2021 which revealed cell count 0, protein 23, glucose 59, negative culture, negative VDRL but >5 oligoclonal bands not in corresponding serum.  Formally diagnosed with MS.  Neurology suspected that as viral load became controlled with antiviral therapy with increased CD4 counts, she had an exacerbation.  As case reports found that treatment for HIV may provide a protective benefit on MS progression, neurology considered monitoring vs changing antiviral therpay to Atripla vs starting a low-dose interferon beta-1a. She missed her follow up appointment with neurology.  Symptoms improved once HIV became controlled.  Since early 2023, notices gradual progression of symptoms   Currently taking Cabenuva.  Viral load undetectable.  CD4 count 898.    Past DMT:  Aubagio (headaches, rash)  Past medications:  Robaxin, sertraline, tramadol, Ambien, Flexeril (helpful)   Imaging: 01/21/2022 MRI BRAIN W WO:  Largely stable T2 and FLAIR signal affecting the cerebral hemispheric white matter with minimal involvement of the middle cerebellar peduncle on the left consistent with the clinical diagnosis of multiple sclerosis. There are a few small foci of newly seen signal in the hemispheric white matter as marked by arrows, representing a minimal change. None of these show restricted diffusion or contrast enhancement. 01/18/2022 MRI C-SPINE W WO:  1. Stable and normal MRI appearance of the cervical spinal cord with no evidence for demyelinating disease or abnormal enhancement.  2. Mild cervical spondylosis at  C5-6 with resultant mild to moderate bilateral C6 foraminal stenosis. 3. Additional mild spondylosis elsewhere within the cervical spine without significant stenosis or impingement. 01/18/2022 MRI T-SPINE W WO:  1. Stable demyelinating lesion involving the right dorsolateral cord at T8-9. No associated enhancement to suggest active demyelination.  No new lesions to  suggest interval disease progression. 2. Otherwise normal MRI of the thoracic spine. 04/30/2020 MRI C-SPINE W WO:  Unremarkable MRI cervical spine (with and without).  No intrinsic, compressive or abnormal enhancing spinal cord lesions. 04/22/2020 MRI BRAIN W WO:  Multiple scattered patchy T2/FLAIR hyperintensities involving the supratentorial cerebral white matter, nonspecific, but most suspicious for possible demyelinating disease/multiple sclerosis. No evidence for active demyelination. 02/25/2020 MRI T-SPINE W WO:  1.   There is a T2 hyperintense focus with subtle enhancement adjacent to T8-T9 in the right posterolateral spinal cord. Though nonspecific, this is most likely to represent a focus of demyelination or inflammation.  Consider MRI of the brain to determine if there is evidence of demyelination elsewhere.  2.   No spinal degenerative changes. 02/11/2020 MRI L-SPINE W WO:  Central disc protrusion at L5-S1, contacting the thecal sac and S1 root sleeves as they bud from the thecal sac. Nerve compression is not demonstrated. This abnormality could result in nerve irritation.  Mild facet osteoarthritis at L4-5 and L5-S1 that could contribute to low back pain.   JCV ab positive with index of 1.59; Quantiferon-TB Gold Plus negative   Family history:  Sister has MS.  Maternal grandmother may have had it (based on symptoms but patient unsure if she was evaluated or diagnosed)  PAST MEDICAL HISTORY: Past Medical History:  Diagnosis Date   Abnormal MRI, spine 04/06/2020   Alpha thalassemia silent carrier 11/28/2022   No show genetic counseling virtual visit 6/3 - reschedule if desired     FOB declined testing.      Arthralgia 02/18/2022   Back pain 10/13/2011   Complication of anesthesia    epidural with last pregnancy made entire left side numb, had to d/c   Depression    GDM (gestational diabetes mellitus) 02/11/2023   GERD (gastroesophageal reflux disease) 05/21/2020   Gestational  hypertension    History of reversal of tubal ligation 03/17/2023   HIV-1 (human immunodeficiency virus I) (HCC)    HSV 06/28/2007   Major depressive disorder, recurrent episode, moderate (HCC) 06/08/2013   Migraines    Multiple sclerosis (HCC)    Right leg numbness 02/07/2020   Vitamin D deficiency 06/20/2022    MEDICATIONS: Current Outpatient Medications on File Prior to Visit  Medication Sig Dispense Refill   acetaminophen (TYLENOL) 500 MG tablet Take 500 mg by mouth every 6 (six) hours as needed for mild pain.     cabotegravir & rilpivirine ER (CABENUVA) 600 & 900 MG/3ML injection Inject 1 kit into the muscle every 2 (two) months. (Patient not taking: Reported on 06/10/2023) 6 mL 5   Cholecalciferol (VITAMIN D) 125 MCG (5000 UT) CAPS Take 5,000 Units by mouth daily.     Prenatal Vit-Fe Fumarate-FA (M-NATAL PLUS) 27-1 MG TABS Take 1 tablet by mouth daily. 30 tablet 11   Current Facility-Administered Medications on File Prior to Visit  Medication Dose Route Frequency Provider Last Rate Last Admin   cabotegravir & rilpivirine ER (CABENUVA) 400 & 600 MG/2ML injection 1 kit  1 kit Intramuscular Once Veryl Speak, FNP        ALLERGIES: Allergies  Allergen Reactions   Contrast Media [Iodinated Contrast Media]  Nausea And Vomiting    FAMILY HISTORY: Family History  Problem Relation Age of Onset   Alcohol abuse Mother    Drug abuse Mother    Depression Mother    Alcohol abuse Father    Drug abuse Father    Multiple sclerosis Sister    Mental illness Sister        bipolar ptsd   Schizophrenia Sister    Diabetes Maternal Grandmother    Hypertension Maternal Grandmother    Cancer Maternal Grandmother        ovarian, breast and lung   Depression Maternal Grandmother       Objective:  *** General: No acute distress.  Patient appears well-groomed.   Head:  Normocephalic/atraumatic Eyes:  Fundi examined but not visualized Neck: supple, no paraspinal tenderness, full  range of motion Heart:  Regular rate and rhythm Neurological Exam: alert and oriented.  Speech fluent and not dysarthric, language intact.  CN II-XII intact. Bulk and tone normal, muscle strength 5/5 throughout.  Sensation to light touch intact.  Deep tendon reflexes 2+ throughout.  Finger to nose testing intact.  Gait normal, Romberg negative.   Shon Millet, DO  CC: Erick Alley, DO

## 2023-07-15 ENCOUNTER — Ambulatory Visit: Payer: Self-pay | Admitting: Neurology

## 2023-07-15 ENCOUNTER — Encounter: Payer: Self-pay | Admitting: Neurology

## 2023-07-16 ENCOUNTER — Other Ambulatory Visit: Payer: BC Managed Care – PPO

## 2023-07-16 ENCOUNTER — Telehealth: Payer: Self-pay

## 2023-07-16 NOTE — Telephone Encounter (Signed)
[name]  Patient's medications CABENUVA have been couriered to [facility] from [facility] and will be administered at the patients appointment on 07/20/23.  Kae Heller, CPhT Specialty Pharmacy Patient [facility] for Infectious Disease Phone: [phone] Fax:  [phone]

## 2023-07-20 ENCOUNTER — Other Ambulatory Visit: Payer: Self-pay

## 2023-07-20 ENCOUNTER — Encounter: Payer: Self-pay | Admitting: Family

## 2023-07-20 ENCOUNTER — Ambulatory Visit (INDEPENDENT_AMBULATORY_CARE_PROVIDER_SITE_OTHER): Payer: BC Managed Care – PPO | Admitting: Family

## 2023-07-20 ENCOUNTER — Ambulatory Visit
Admission: RE | Admit: 2023-07-20 | Discharge: 2023-07-20 | Disposition: A | Discharge status: Home / Self Care | Payer: BC Managed Care – PPO | Source: Ambulatory Visit | Attending: Neurology | Admitting: Neurology

## 2023-07-20 VITALS — BP 121/77 | HR 104 | Temp 98.4°F | Ht 62.0 in | Wt 218.0 lb

## 2023-07-20 DIAGNOSIS — B20 Human immunodeficiency virus [HIV] disease: Secondary | ICD-10-CM | POA: Diagnosis not present

## 2023-07-20 DIAGNOSIS — G35 Multiple sclerosis: Secondary | ICD-10-CM | POA: Diagnosis not present

## 2023-07-20 DIAGNOSIS — Z21 Asymptomatic human immunodeficiency virus [HIV] infection status: Secondary | ICD-10-CM

## 2023-07-20 DIAGNOSIS — Z0000 Encounter for general adult medical examination without abnormal findings: Secondary | ICD-10-CM

## 2023-07-20 MED ORDER — CABOTEGRAVIR & RILPIVIRINE ER 600 & 900 MG/3ML IM SUER
1.0000 | Freq: Once | INTRAMUSCULAR | Status: AC
  Administered 2023-07-20: 1 via INTRAMUSCULAR

## 2023-07-20 MED ORDER — GADOPICLENOL 0.5 MMOL/ML IV SOLN
9.0000 mL | Freq: Once | INTRAVENOUS | Status: AC | PRN
  Administered 2023-07-20: 9 mL via INTRAVENOUS

## 2023-07-20 NOTE — Assessment & Plan Note (Signed)
Ms. Petzold continues to have well controlled virus with good adherence and tolerance to Cabenuva .  Reviewed lab work and discussed plan of care, U equals U, and family planning. Check lab work. Continue current dose of Cabenuva . Plan for follow up in  6 months or sooner if needed with lab work on the same day. Follow up with pharmacy providers q 2 months in between.

## 2023-07-20 NOTE — Progress Notes (Signed)
Brief Narrative   Patient ID: Candice [name], female    DOB: [birth year], 38 y.o.   MRN: [id number]  [name] is a 38 y/o female diagnosed with HIV-1 disease diagnosed in March 2021 with risk factor of heterosexual contact. Initial CD4 count was 757 and viral load of 421. Genotype was Subtype B with no significant medication resistant mutations. No history of opportunistic infection. [id number] negative. Entered care at [facility] Stage 1. Previous ART exposure to [place] and currently [place].   Subjective:    Chief Complaint  Patient presents with   Follow-up    HPI:  Candice [name] is a 38 y.o. female with HIV disease last seen on 02/27/2023 with well-controlled virus and good adherence and tolerance to [place].  She received her last injection on 05/14/2023 by Margarite Gouge, Pharm.D., CPP.  Viral load is undetectable with CD4 count 684.  Here today for routine follow-up and next injection.  Ms. Castello continues to receive Cabenuva with no adverse side effects.  She is 3 months postpartum with mother and baby doing well.  Has concerns for possible flares of her MS and is working with neurology for MRI.  No new concerns/complaints.  Condoms and site-specific STD testing offered.  Healthcare maintenance reviewed and due for routine dental care.  She is back at work and doing well.  Denies fevers, chills, night sweats, headaches, changes in vision, neck pain/stiffness, nausea, diarrhea, vomiting, lesions or rashes.  Lab Results  Component Value Date   CD4TCELL 43 09/11/2022   CD4TABS 684 09/11/2022   Lab Results  Component Value Date   HIV1RNAQUANT Not Detected 05/14/2023     Allergies  Allergen Reactions   Contrast Media [Iodinated Contrast Media] Nausea And Vomiting      Outpatient Medications Prior to Visit  Medication Sig Dispense Refill   acetaminophen (TYLENOL) 500 MG tablet Take 500 mg by mouth every 6 (six) hours as needed for mild pain.     cabotegravir &  rilpivirine ER (CABENUVA) 600 & 900 MG/3ML injection Inject 1 kit into the muscle every 2 (two) months. 6 mL 5   Cholecalciferol (VITAMIN D) 125 MCG (5000 UT) CAPS Take 5,000 Units by mouth daily.     Prenatal Vit-Fe Fumarate-FA (M-NATAL PLUS) 27-1 MG TABS Take 1 tablet by mouth daily. (Patient not taking: Reported on 07/20/2023) 30 tablet 11   cabotegravir & rilpivirine ER (CABENUVA) 400 & 600 MG/2ML injection 1 kit      No facility-administered medications prior to visit.     Past Medical History:  Diagnosis Date   Abnormal MRI, spine 04/06/2020   Alpha thalassemia silent carrier 11/28/2022   No show genetic counseling virtual visit 6/3 - reschedule if desired     FOB declined testing.      Arthralgia 02/18/2022   Back pain 10/13/2011   Complication of anesthesia    epidural with last pregnancy made entire left side numb, had to d/c   Depression    GDM (gestational diabetes mellitus) 02/11/2023   GERD (gastroesophageal reflux disease) 05/21/2020   Gestational hypertension    History of reversal of tubal ligation 03/17/2023   HIV-1 (human immunodeficiency virus I) (HCC)    HSV 06/28/2007   Major depressive disorder, recurrent episode, moderate (HCC) 06/08/2013   Migraines    Multiple sclerosis (HCC)    Right leg numbness 02/07/2020   Vitamin D deficiency 06/20/2022     Past Surgical History:  Procedure Laterality Date   BREAST SURGERY  2019  reduction   CESAREAN SECTION     CESAREAN SECTION  09/07/2011   Procedure: CESAREAN SECTION;  Surgeon: Lazaro Arms, MD;  Location: [facility]S;  Service: Gynecology;  Laterality: N/A;  Repeat cesarean section with delivery of baby boy at 30. Apgars 9/9.  Bilateral tubal ligation.   CESAREAN SECTION N/A 04/25/2023   Procedure: CESAREAN SECTION;  Surgeon: Conan Bowens, MD;  Location: [facility] ORS;  Service: Obstetrics;  Laterality: N/A;   IUD REMOVAL  2005   in cervix   TUBAL LIGATION     tubal reanastomosis  08/13/2016      Review of  Systems  Constitutional:  Negative for appetite change, chills, diaphoresis, fatigue, fever and unexpected weight change.  Eyes:        Negative for acute change in vision  Respiratory:  Negative for chest tightness, shortness of breath and wheezing.   Cardiovascular:  Negative for chest pain.  Gastrointestinal:  Negative for diarrhea, nausea and vomiting.  Genitourinary:  Negative for dysuria, pelvic pain and vaginal discharge.  Musculoskeletal:  Negative for neck pain and neck stiffness.  Skin:  Negative for rash.  Neurological:  Negative for seizures, syncope, weakness and headaches.  Hematological:  Negative for adenopathy. Does not bruise/bleed easily.  Psychiatric/Behavioral:  Negative for hallucinations.       Objective:    BP 121/77   Pulse (!) 104   Temp 98.4 F (36.9 C) (Temporal)   Ht 5\' 2"  (1.575 m)   Wt 218 lb (98.9 kg)   SpO2 98%   BMI 39.87 kg/m  Nursing note and vital signs reviewed.  Physical Exam Constitutional:      General: She is not in acute distress.    Appearance: She is well-developed.  Eyes:     Conjunctiva/sclera: Conjunctivae normal.  Cardiovascular:     Rate and Rhythm: Normal rate and regular rhythm.     Heart sounds: Normal heart sounds. No murmur heard.    No friction rub. No gallop.  Pulmonary:     Effort: Pulmonary effort is normal. No respiratory distress.     Breath sounds: Normal breath sounds. No wheezing or rales.  Chest:     Chest wall: No tenderness.  Abdominal:     General: Bowel sounds are normal.     Palpations: Abdomen is soft.     Tenderness: There is no abdominal tenderness.  Musculoskeletal:     Cervical back: Neck supple.  Lymphadenopathy:     Cervical: No cervical adenopathy.  Skin:    General: Skin is warm and dry.     Findings: No rash.  Neurological:     Mental Status: She is alert and oriented to person, place, and time.  Psychiatric:        Behavior: Behavior normal.        Thought Content: Thought  content normal.        Judgment: Judgment normal.         07/20/2023    9:51 AM 02/23/2023    9:33 AM 11/10/2022    3:46 PM 10/14/2022    3:09 PM 09/17/2022    9:05 AM  Depression screen PHQ 2/9  Decreased Interest 0 0 1 1 0  Down, Depressed, Hopeless 0 0 0 0 0  PHQ - 2 Score 0 0 1 1 0  Altered sleeping   1 1 1   Tired, decreased energy   1 2 1   Change in appetite   1 1 1   Feeling bad or  failure about yourself    1  0  Trouble concentrating   0 0 0  Moving slowly or fidgety/restless   0 0 0  Suicidal thoughts   0 0 0  PHQ-9 Score   5 5 3        Assessment & Plan:    Patient Active Problem List   Diagnosis Date Noted   History of depression 01/13/2022   MS (multiple sclerosis) (HCC) 05/21/2020   Healthcare maintenance 02/10/2020   HIV-1 (human immunodeficiency virus I) (HCC) 09/07/2019   Genital herpes 02/16/2013     Problem List Items Addressed This Visit       Other   HIV-1 (human immunodeficiency virus I) (HCC) - Primary   Ms. Obando continues to have well controlled virus with good adherence and tolerance to [place] .  Reviewed lab work and discussed plan of care, U equals U, and family planning. Check lab work. Continue current dose of Cabenuva . Plan for follow up in  6 months or sooner if needed with lab work on the same day. Follow up with pharmacy providers q 2 months in between.        Relevant Orders   COMPLETE METABOLIC PANEL WITH GFR   HIV-1 RNA quant-no reflex-bld   T-helper cell (CD4)- ([facility] clinic only)   AMB REFERRAL TO COMMUNITY SERVICE AGENCY   Healthcare maintenance   Discussed importance of safe sexual practice and condom use. Condoms and site specific STD testing offered.  Due for routine dental care with referral to [facility] placed.  Vaccinations reviewed and declined today.         I am having Joyice Christene Lye maintain her acetaminophen, M-Natal Plus, Vitamin D, and cabotegravir & rilpivirine ER. We administered cabotegravir & rilpivirine  ER.   Meds ordered this encounter  Medications   cabotegravir & rilpivirine ER (CABENUVA) 600 & 900 MG/3ML injection 1 kit     Follow-up: Return in about 6 months (around 01/17/2024). or sooner if needed.    Marcos Eke, MSN, FNP-C Nurse Practitioner [facility] for Infectious Disease [facility] Main number: [phone]

## 2023-07-20 NOTE — Patient Instructions (Addendum)
Nice to see you.  Continue to take your medication daily as prescribed.  Please call [facility]) to schedule/follow up on your dental care at [phone]  Plan for follow up in 6 months or sooner if needed with lab work on the same day and pharmacy providers in between.   Have a great day and stay safe!

## 2023-07-20 NOTE — Assessment & Plan Note (Signed)
Discussed importance of safe sexual practice and condom use. Condoms and site specific STD testing offered.  Due for routine dental care with referral to [facility] placed.  Vaccinations reviewed and declined today.

## 2023-07-21 LAB — T-HELPER CELL (CD4) - (RCID CLINIC ONLY)
CD4 % Helper T Cell: 55 % (ref 33–65)
CD4 T Cell Abs: 630 /uL (ref 400–1790)

## 2023-07-22 LAB — COMPLETE METABOLIC PANEL WITH GFR
AG Ratio: 1.6 (calc) (ref 1.0–2.5)
ALT: 26 U/L (ref 6–29)
AST: 18 U/L (ref 10–30)
Albumin: 4.4 g/dL (ref 3.6–5.1)
Alkaline phosphatase (APISO): 65 U/L (ref 31–125)
BUN: 8 mg/dL (ref 7–25)
CO2: 28 mmol/L (ref 20–32)
Calcium: 9.7 mg/dL (ref 8.6–10.2)
Chloride: 100 mmol/L (ref 98–110)
Creat: 0.74 mg/dL (ref 0.50–0.97)
Globulin: 2.8 g/dL (ref 1.9–3.7)
Glucose, Bld: 89 mg/dL (ref 65–99)
Potassium: 4.1 mmol/L (ref 3.5–5.3)
Sodium: 135 mmol/L (ref 135–146)
Total Bilirubin: 0.3 mg/dL (ref 0.2–1.2)
Total Protein: 7.2 g/dL (ref 6.1–8.1)
eGFR: 105 mL/min/{1.73_m2} (ref >=60)

## 2023-07-22 LAB — HIV-1 RNA QUANT-NO REFLEX-BLD
HIV 1 RNA Quant: NOT DETECTED {copies}/mL
HIV-1 RNA Quant, Log: NOT DETECTED {Log}

## 2023-08-31 ENCOUNTER — Other Ambulatory Visit (HOSPITAL_COMMUNITY): Payer: Self-pay

## 2023-08-31 ENCOUNTER — Other Ambulatory Visit: Payer: Self-pay

## 2023-08-31 NOTE — Progress Notes (Signed)
 Specialty Pharmacy Refill Coordination Note  Candice [name] is a 38 y.o. female assessed today regarding refills of clinic administered specialty medication(s) Cabotegravir & Rilpivirine [facility])   Clinic requested Courier to Provider Office   Delivery date: 09/07/23   Verified address: [address] [place] [address]   Medication will be filled on 09/04/23.

## 2023-09-03 DIAGNOSIS — G35 Multiple sclerosis: Secondary | ICD-10-CM | POA: Diagnosis not present

## 2023-09-07 ENCOUNTER — Telehealth: Payer: Self-pay

## 2023-09-07 NOTE — Progress Notes (Unsigned)
 HPI: Candice [name] is a 38 y.o. female who presents to the [facility] pharmacy clinic for [place] administration.  Patient Active Problem List   Diagnosis Date Noted   History of depression 01/13/2022   MS (multiple sclerosis) (HCC) 05/21/2020   Healthcare maintenance 02/10/2020   HIV-1 (human immunodeficiency virus I) (HCC) 09/07/2019   Genital herpes 02/16/2013    Patient's Medications  New Prescriptions   No medications on file  Previous Medications   ACETAMINOPHEN (TYLENOL) 500 MG TABLET    Take 500 mg by mouth every 6 (six) hours as needed for mild pain.   CABOTEGRAVIR & RILPIVIRINE ER (CABENUVA) 600 & 900 MG/3ML INJECTION    Inject 1 kit into the muscle every 2 (two) months.   CHOLECALCIFEROL (VITAMIN D) 125 MCG (5000 UT) CAPS    Take 5,000 Units by mouth daily.  Modified Medications   No medications on file  Discontinued Medications   No medications on file    Allergies: Allergies  Allergen Reactions   Contrast Media [Iodinated Contrast Media] Nausea And Vomiting    Past Medical History: Past Medical History:  Diagnosis Date   Abnormal MRI, spine 04/06/2020   Alpha thalassemia silent carrier 11/28/2022   No show genetic counseling virtual visit 6/3 - reschedule if desired     FOB declined testing.      Arthralgia 02/18/2022   Back pain 10/13/2011   Complication of anesthesia    epidural with last pregnancy made entire left side numb, had to d/c   Depression    GDM (gestational diabetes mellitus) 02/11/2023   GERD (gastroesophageal reflux disease) 05/21/2020   Gestational hypertension    History of reversal of tubal ligation 03/17/2023   HIV-1 (human immunodeficiency virus I) (HCC)    HSV 06/28/2007   Major depressive disorder, recurrent episode, moderate (HCC) 06/08/2013   Migraines    Multiple sclerosis (HCC)    Right leg numbness 02/07/2020   Vitamin D deficiency 06/20/2022    Social History: Social History   Socioeconomic History   Marital  status: Single    Spouse name: Not on file   Number of children: 2   Years of education: 14   Highest education level: Not on file  Occupational History    Comment: Associate Professor  Tobacco Use   Smoking status: Every Day    Current packs/day: 0.30    Types: Cigarettes    Passive exposure: Current   Smokeless tobacco: Never   Tobacco comments:    Trying to quit due to pregnancy   Vaping Use   Vaping status: Never Used  Substance and Sexual Activity   Alcohol use: Not Currently    Comment: occasional   Drug use: Not Currently    Types: Marijuana    Comment: patient reports last THC use beginning of April   Sexual activity: Not Currently    Partners: Male    Birth control/protection: None  Other Topics Concern   Not on file  Social History Narrative   Works as Associate Professor.   FOB of both children lives in [place]; will be some what involved but mother is no longer dating him.   Social Drivers of Corporate investment banker Strain: Not on file  Food Insecurity: Food Insecurity Present (02/23/2023)   Hunger Vital Sign    Worried About Running Out of Food in the Last Year: Sometimes true    Ran Out of Food in the Last Year: Sometimes true  Transportation Needs: Not on  file  Physical Activity: Not on file  Stress: Not on file  Social Connections: Not on file    Labs: Lab Results  Component Value Date   HIV1RNAQUANT Not Detected 07/20/2023   HIV1RNAQUANT Not Detected 05/14/2023   HIV1RNAQUANT Not Detected 04/10/2023   CD4TABS 630 07/20/2023   CD4TABS 684 09/11/2022   CD4TABS 1,026 11/22/2020    RPR and STI Lab Results  Component Value Date   LABRPR NON REACTIVE 04/23/2023   LABRPR Non Reactive 02/06/2023   LABRPR Non Reactive 09/17/2022   LABRPR NON-REACTIVE 12/13/2021   LABRPR NON-REACTIVE 06/20/2021    STI Results GC CT  04/14/2023  3:14 PM Negative  Negative   09/17/2022 10:00 AM Negative  Negative   08/29/2022  3:43 PM Negative  Negative    02/14/2022  9:13 AM Negative    Negative  Negative    Negative   12/13/2021  9:09 AM Negative    Negative  Negative    Negative   07/29/2021  4:28 PM Negative  Negative   06/20/2021  9:20 AM Negative  Negative   12/21/2020  3:45 PM Negative  Negative   11/22/2020  9:58 AM Negative  Negative   10/25/2020  9:52 AM Negative  Negative   09/05/2019  4:31 PM Negative  Negative   10/16/2017 12:00 AM Negative  Negative   06/08/2017 12:00 AM Negative  Negative   10/14/2016 12:00 AM Negative  Negative   02/15/2016 12:00 AM Negative  Negative   07/13/2015 12:00 AM Negative  Negative   04/13/2015 12:00 AM Negative  Negative   09/24/2013 12:00 AM NG: Negative  CT: Negative   02/11/2011  3:36 PM  NEGATIVE   01/29/2010  7:00 PM  NEGATIVE     Hepatitis B Lab Results  Component Value Date   HEPBSAB REACTIVE (A) 09/08/2019   HEPBSAG Negative 09/17/2022   HEPBCAB NON-REACTIVE 09/08/2019   Hepatitis C Lab Results  Component Value Date   HEPCAB NON-REACTIVE 09/08/2019   Hepatitis A Lab Results  Component Value Date   HAV BORDERLINE (A) 09/08/2019   Lipids: Lab Results  Component Value Date   CHOL 183 11/22/2020   TRIG 80 11/22/2020   HDL 51 11/22/2020   CHOLHDL 3.6 11/22/2020   VLDL 10 02/14/2013   LDLCALC 115 (H) 11/22/2020    TARGET DATE:  The 25th of the month  Assessment: [name] presents today for their maintenance Cabenuva injections. Initial/past injections were tolerated well without issues. No problems with systemic effects of injections.   Administered cabotegravir 600mg /27mL in left upper outer quadrant of the gluteal muscle. Administered rilpivirine 900 mg/44mL in the right upper outer quadrant of the gluteal muscle. Monitored patient for 10 minutes after injection. Injections were tolerated well without issue. Patient will follow up in 2 months for next injection. Will defer HIV RNA as it was recently assessed in January.  Eligible for PCV20, flu, COVID, and Shingles  which she politely declines.   Plan: - Cabenuva injections administered - Next injections scheduled for *** with me and *** with Tammy Sours - Call with any issues or questions  Margarite Gouge, PharmD, CPP, BCIDP, AAHIVP Clinical Pharmacist Practitioner Infectious Diseases Clinical Pharmacist [facility]

## 2023-09-07 NOTE — Telephone Encounter (Signed)
 [name]  Patient's medications Candice [name] have been couriered to [facility] from [facility] pharmacy and will be administered at the patients appointment on 09/11/23.  Clearance Coots, CPhT Specialty Pharmacy Patient [facility] for Infectious Disease Phone: [phone] Fax:  [phone]

## 2023-09-11 ENCOUNTER — Other Ambulatory Visit: Payer: Self-pay

## 2023-09-11 ENCOUNTER — Ambulatory Visit: Payer: BC Managed Care – PPO | Admitting: Pharmacist

## 2023-09-11 ENCOUNTER — Encounter: Payer: Self-pay | Admitting: Sports Medicine

## 2023-09-11 DIAGNOSIS — Z21 Asymptomatic human immunodeficiency virus [HIV] infection status: Secondary | ICD-10-CM | POA: Diagnosis not present

## 2023-09-11 MED ORDER — CABOTEGRAVIR & RILPIVIRINE ER 600 & 900 MG/3ML IM SUER
1.0000 | Freq: Once | INTRAMUSCULAR | Status: AC
  Administered 2023-09-11: 1 via INTRAMUSCULAR

## 2023-09-15 NOTE — Progress Notes (Unsigned)
 Candice [name] D.[name] [address] [place] [address] Phone: [phone]   Assessment and Plan:     There are no diagnoses linked to this encounter.  *** - Patient has received relief with OMT in the past.  Elects for repeat OMT today.  Tolerated well per note below. - Decision today to treat with OMT was based on Physical Exam   After verbal consent patient was treated with HVLA (high velocity low amplitude), ME (muscle energy), FPR (flex positional release), ST (soft tissue), PC/PD (Pelvic Compression/ Pelvic Decompression) techniques in cervical, rib, thoracic, lumbar, and pelvic areas. Patient tolerated the procedure well with improvement in symptoms.  Patient educated on potential side effects of soreness and recommended to rest, hydrate, and use Tylenol as needed for pain control.   Pertinent previous records reviewed include ***    Follow Up: ***     Subjective:   I, Candice [name], am serving as a Neurosurgeon for Doctor Richardean Sale   Chief Complaint: thoracic back pain    HPI:    02/17/2022 Patient is a 38 year old female complaining of thoracic back pain. Patient states  Beginning around 2018, she began having intermittent episodes of numbness, weakness and dizziness.  In May or June of that year, she began having a recurrence of symptoms, including right flank pain and numbness radiating down her right leg with some associated right leg weakness.  She felt off balance.  She saw neurology at that time.  MRI of thoracic spine revealed subtle enhancing hyperintense focus at the T8-T9 region.would like to discuss OMT    03/10/2022 Patient states that she is alright, left side back is still tight and shoulders    04/07/2022 Patient states that she is okay left side is still crazy even with PT   05/05/2022 Patient states she is good needs a tune up   06/02/2022 Patient states that she is pretty good    06/30/2022 Patient states left side  is a problem , PT told her to re check rotator cuff   07/22/2022 Patient states she is pretty good, CSI was a life saver    09/09/2022 Patient states that she has been having intermittent flares    10/07/2022 Patient states that she would still like to get OMT while pregnant    11/04/2022 Patient states left side is  jacked up again    11/27/2022 Patient states shoulder neck and regular adjustment    01/02/2023 Patient states back is flared , but other than that pretty good    01/30/2023 Patient states left side is starting to act up and spasm    02/09/2023 Patient states that her shoulder pain has started coming back in the last 2-3 weeks and it is burning.   02/27/2023 Patient states that she is decent. Left sided neck tightness    09/16/2023 Patient states  Relevant Historical Information: Multiple sclerosis  Additional pertinent review of systems negative.  Current Outpatient Medications  Medication Sig Dispense Refill   acetaminophen (TYLENOL) 500 MG tablet Take 500 mg by mouth every 6 (six) hours as needed for mild pain.     cabotegravir & rilpivirine ER (CABENUVA) 600 & 900 MG/3ML injection Inject 1 kit into the muscle every 2 (two) months. 6 mL 5   Cholecalciferol (VITAMIN D) 125 MCG (5000 UT) CAPS Take 5,000 Units by mouth daily.     No current facility-administered medications for this visit.      Objective:  There were no vitals filed for this visit.    There is no height or weight on file to calculate BMI.    Physical Exam:     General: Well-appearing, cooperative, sitting comfortably in no acute distress.   OMT Physical Exam:  ASIS Compression Test: Positive Right Cervical: TTP paraspinal, *** Rib: Bilateral elevated first rib with TTP Thoracic: TTP paraspinal,*** Lumbar: TTP paraspinal,*** Pelvis: Right anterior innominate  Electronically signed by:  Candice [name] D.[name] 7:44 AM 09/15/23

## 2023-09-16 ENCOUNTER — Ambulatory Visit: Admitting: Sports Medicine

## 2023-09-16 VITALS — HR 93 | Ht 62.0 in | Wt 218.0 lb

## 2023-09-16 DIAGNOSIS — M546 Pain in thoracic spine: Secondary | ICD-10-CM | POA: Diagnosis not present

## 2023-09-16 DIAGNOSIS — M9902 Segmental and somatic dysfunction of thoracic region: Secondary | ICD-10-CM | POA: Diagnosis not present

## 2023-09-16 DIAGNOSIS — M542 Cervicalgia: Secondary | ICD-10-CM | POA: Diagnosis not present

## 2023-09-16 DIAGNOSIS — M9901 Segmental and somatic dysfunction of cervical region: Secondary | ICD-10-CM | POA: Diagnosis not present

## 2023-09-16 DIAGNOSIS — M9908 Segmental and somatic dysfunction of rib cage: Secondary | ICD-10-CM

## 2023-09-16 DIAGNOSIS — G8929 Other chronic pain: Secondary | ICD-10-CM

## 2023-09-16 NOTE — Patient Instructions (Signed)
 Follow-up in 4 weeks

## 2023-10-05 ENCOUNTER — Other Ambulatory Visit (HOSPITAL_COMMUNITY): Payer: Self-pay

## 2023-10-12 ENCOUNTER — Other Ambulatory Visit (HOSPITAL_COMMUNITY): Payer: Self-pay

## 2023-10-13 NOTE — Progress Notes (Deleted)
 Candice [name] D.[facility] [address] [place] [address] Phone: [phone]   Assessment and Plan:     There are no diagnoses linked to this encounter.  *** - Patient has received relief with OMT in the past.  Elects for repeat OMT today.  Tolerated well per note below. - Decision today to treat with OMT was based on Physical Exam   After verbal consent patient was treated with HVLA (high velocity low amplitude), ME (muscle energy), FPR (flex positional release), ST (soft tissue), PC/PD (Pelvic Compression/ Pelvic Decompression) techniques in cervical, rib, thoracic, lumbar, and pelvic areas. Patient tolerated the procedure well with improvement in symptoms.  Patient educated on potential side effects of soreness and recommended to rest, hydrate, and use Tylenol  as needed for pain control.   Pertinent previous records reviewed include ***    Follow Up: ***     Subjective:   I, Candice [name], am serving as a Neurosurgeon for Doctor Ulysees Gander   Chief Complaint: thoracic back pain    HPI:    02/17/2022 Patient is a 38 year old female complaining of thoracic back pain. Patient states  Beginning around 2018, she began having intermittent episodes of numbness, weakness and dizziness.  In May or June of that year, she began having a recurrence of symptoms, including right flank pain and numbness radiating down her right leg with some associated right leg weakness.  She felt off balance.  She saw neurology at that time.  MRI of thoracic spine revealed subtle enhancing hyperintense focus at the T8-T9 region.would like to discuss OMT    03/10/2022 Patient states that she is alright, left side back is still tight and shoulders    04/07/2022 Patient states that she is okay left side is still crazy even with PT   05/05/2022 Patient states she is good needs a tune up   06/02/2022 Patient states that she is pretty good    06/30/2022 Patient states left side  is a problem , PT told her to re check rotator cuff   07/22/2022 Patient states she is pretty good, CSI was a life saver    09/09/2022 Patient states that she has been having intermittent flares    10/07/2022 Patient states that she would still like to get OMT while pregnant    11/04/2022 Patient states left side is  jacked up again    11/27/2022 Patient states shoulder neck and regular adjustment    01/02/2023 Patient states back is flared , but other than that pretty good    01/30/2023 Patient states left side is starting to act up and spasm    02/09/2023 Patient states that her shoulder pain has started coming back in the last 2-3 weeks and it is burning.   02/27/2023 Patient states that she is decent. Left sided neck tightness    09/16/2023 Patient states wants to discuss CSI options. And ready for an adjustment    10/14/2023 Patient states  Relevant Historical Information: Multiple sclerosis  Additional pertinent review of systems negative.  Current Outpatient Medications  Medication Sig Dispense Refill   acetaminophen  (TYLENOL ) 500 MG tablet Take 500 mg by mouth every 6 (six) hours as needed for mild pain.     cabotegravir  & rilpivirine  ER (CABENUVA ) 600 & 900 MG/3ML injection Inject 1 kit into the muscle every 2 (two) months. 6 mL 5   Cholecalciferol  (VITAMIN D ) 125 MCG (5000 UT) CAPS Take 5,000 Units by mouth daily.  No current facility-administered medications for this visit.      Objective:     There were no vitals filed for this visit.    There is no height or weight on file to calculate BMI.    Physical Exam:     General: Well-appearing, cooperative, sitting comfortably in no acute distress.   OMT Physical Exam:  ASIS Compression Test: Positive Right Cervical: TTP paraspinal, *** Rib: Bilateral elevated first rib with TTP Thoracic: TTP paraspinal,*** Lumbar: TTP paraspinal,*** Pelvis: Right anterior innominate  Electronically signed by:  Marshall Skeeter D.[facility] 7:37 AM 10/13/23

## 2023-10-14 ENCOUNTER — Encounter: Payer: Self-pay | Admitting: Pharmacist

## 2023-10-14 ENCOUNTER — Ambulatory Visit: Admitting: Sports Medicine

## 2023-10-21 NOTE — Progress Notes (Signed)
 Ben Chistian Kasler D.[facility] [address] [place] [address] Phone: [phone]   Assessment and Plan:     1. Neck pain 2. Chronic bilateral low back pain without sciatica 3. Somatic dysfunction of cervical region 4. Somatic dysfunction of thoracic region 5. Somatic dysfunction of lumbar region 6. Somatic dysfunction of pelvic region 7. Somatic dysfunction of rib region  -Chronic with exacerbation, subsequent visit - Overall significant improvement in neck and upper back pain after OMT and trigger point injections at previous office visit.  Patient has recurrent neck tightness and recurrent lower back pain - Patient has received relief with OMT in the past.  Elects for repeat OMT today.  Tolerated well per note below. - Decision today to treat with OMT was based on Physical Exam   After verbal consent patient was treated with HVLA (high velocity low amplitude), ME (muscle energy), FPR (flex positional release), ST (soft tissue), PC/PD (Pelvic Compression/ Pelvic Decompression) techniques in cervical, rib, thoracic, lumbar, and pelvic areas. Patient tolerated the procedure well with improvement in symptoms.  Patient educated on potential side effects of soreness and recommended to rest, hydrate, and use Tylenol  as needed for pain control.   Pertinent previous records reviewed include none  Follow Up: 4 weeks for reevaluation.  Could consider repeat OMT   Subjective:   I, Candice [name], am serving as a Neurosurgeon for Doctor Ulysees Gander   Chief Complaint: thoracic back pain    HPI:    02/17/2022 Patient is a 38 year old female complaining of thoracic back pain. Patient states  Beginning around 2018, she began having intermittent episodes of numbness, weakness and dizziness.  In May or June of that year, she began having a recurrence of symptoms, including right flank pain and numbness radiating down her right leg with some associated right leg weakness.   She felt off balance.  She saw neurology at that time.  MRI of thoracic spine revealed subtle enhancing hyperintense focus at the T8-T9 region.would like to discuss OMT    03/10/2022 Patient states that she is alright, left side back is still tight and shoulders    04/07/2022 Patient states that she is okay left side is still crazy even with PT   05/05/2022 Patient states she is good needs a tune up   06/02/2022 Patient states that she is pretty good    06/30/2022 Patient states left side is a problem , PT told her to re check rotator cuff   07/22/2022 Patient states she is pretty good, CSI was a life saver    09/09/2022 Patient states that she has been having intermittent flares    10/07/2022 Patient states that she would still like to get OMT while pregnant    11/04/2022 Patient states left side is  jacked up again    11/27/2022 Patient states shoulder neck and regular adjustment    01/02/2023 Patient states back is flared , but other than that pretty good    01/30/2023 Patient states left side is starting to act up and spasm    02/09/2023 Patient states that her shoulder pain has started coming back in the last 2-3 weeks and it is burning.   02/27/2023 Patient states that she is decent. Left sided neck tightness    09/16/2023 Patient states wants to discuss CSI options. And ready for an adjustment    10/22/2023 Patient states shots helped. Low back and neck are tight   Relevant Historical Information: Multiple sclerosis  Additional pertinent review of systems negative.  Current Outpatient Medications  Medication Sig Dispense Refill   acetaminophen  (TYLENOL ) 500 MG tablet Take 500 mg by mouth every 6 (six) hours as needed for mild pain.     cabotegravir  & rilpivirine  ER (CABENUVA ) 600 & 900 MG/3ML injection Inject 1 kit into the muscle every 2 (two) months. 6 mL 5   Cholecalciferol  (VITAMIN D ) 125 MCG (5000 UT) CAPS Take 5,000 Units by mouth daily.     No current  facility-administered medications for this visit.      Objective:     Vitals:   10/22/23 1538  BP: 132/78  Weight: 218 lb (98.9 kg)  Height: 5\' 2"  (1.575 m)      Body mass index is 39.87 kg/m.    Physical Exam:     General: Well-appearing, cooperative, sitting comfortably in no acute distress.   OMT Physical Exam:  ASIS Compression Test: Positive left Cervical: TTP paraspinal, C3 RLSR Rib: Bilateral elevated first rib with TTP on the left Thoracic: TTP paraspinal, T6 RRSR Lumbar: TTP paraspinal, L2 RRSR Pelvis: Left anterior innominate  Electronically signed by:  Marshall Skeeter D.[facility] 3:55 PM 10/22/23

## 2023-10-22 ENCOUNTER — Other Ambulatory Visit (HOSPITAL_COMMUNITY): Payer: Self-pay

## 2023-10-22 ENCOUNTER — Other Ambulatory Visit: Payer: Self-pay

## 2023-10-22 ENCOUNTER — Ambulatory Visit: Admitting: Sports Medicine

## 2023-10-22 VITALS — BP 132/78 | Ht 62.0 in | Wt 218.0 lb

## 2023-10-22 DIAGNOSIS — M9901 Segmental and somatic dysfunction of cervical region: Secondary | ICD-10-CM

## 2023-10-22 DIAGNOSIS — G8929 Other chronic pain: Secondary | ICD-10-CM

## 2023-10-22 DIAGNOSIS — M9905 Segmental and somatic dysfunction of pelvic region: Secondary | ICD-10-CM

## 2023-10-22 DIAGNOSIS — M9902 Segmental and somatic dysfunction of thoracic region: Secondary | ICD-10-CM | POA: Diagnosis not present

## 2023-10-22 DIAGNOSIS — M542 Cervicalgia: Secondary | ICD-10-CM

## 2023-10-22 DIAGNOSIS — M9908 Segmental and somatic dysfunction of rib cage: Secondary | ICD-10-CM

## 2023-10-22 DIAGNOSIS — M9903 Segmental and somatic dysfunction of lumbar region: Secondary | ICD-10-CM | POA: Diagnosis not present

## 2023-10-22 DIAGNOSIS — M545 Low back pain, unspecified: Secondary | ICD-10-CM

## 2023-10-22 NOTE — Progress Notes (Signed)
 Specialty Pharmacy Refill Coordination Note  Candice [name] is a 38 y.o. female assessed today regarding refills of clinic administered specialty medication(s) Cabotegravir  & Rilpivirine  (CABENUVA )   Clinic requested Courier to Provider Office   Delivery date: 11/04/23   Verified address: [address] [place] [address]   Medication will be filled on 11/03/23.

## 2023-11-04 ENCOUNTER — Telehealth: Payer: Self-pay

## 2023-11-04 NOTE — Telephone Encounter (Signed)
 [name]  Patient's medications Cabenuva  have been couriered to [facility] from [facility] and will be administered at the patients appointment on 11/13/23.  Roylene Corn, CPhT Specialty Pharmacy Patient [facility] for [facility] Phone: [phone] Fax:  [phone]

## 2023-11-10 ENCOUNTER — Encounter: Admitting: Pharmacist

## 2023-11-12 NOTE — Progress Notes (Signed)
 HPI: Candice [name] is a 38 y.o. female who presents to the [facility] pharmacy clinic for Cabenuva  administration.  Patient Active Problem List   Diagnosis Date Noted   History of depression 01/13/2022   MS (multiple sclerosis) (HCC) 05/21/2020   Healthcare maintenance 02/10/2020   HIV-1 (human immunodeficiency virus I) (HCC) 09/07/2019   Genital herpes 02/16/2013    Patient's Medications  New Prescriptions   No medications on file  Previous Medications   ACETAMINOPHEN  (TYLENOL ) 500 MG TABLET    Take 500 mg by mouth every 6 (six) hours as needed for mild pain.   CABOTEGRAVIR  & RILPIVIRINE  ER (CABENUVA ) 600 & 900 MG/3ML INJECTION    Inject 1 kit into the muscle every 2 (two) months.   CHOLECALCIFEROL  (VITAMIN D ) 125 MCG (5000 UT) CAPS    Take 5,000 Units by mouth daily.  Modified Medications   No medications on file  Discontinued Medications   No medications on file    Allergies: Allergies  Allergen Reactions   Contrast Media [Iodinated Contrast Media] Nausea And Vomiting    Labs: Lab Results  Component Value Date   HIV1RNAQUANT Not Detected 07/20/2023   HIV1RNAQUANT Not Detected 05/14/2023   HIV1RNAQUANT Not Detected 04/10/2023   CD4TABS 630 07/20/2023   CD4TABS 684 09/11/2022   CD4TABS 1,026 11/22/2020    RPR and STI Lab Results  Component Value Date   LABRPR NON REACTIVE 04/23/2023   LABRPR Non Reactive 02/06/2023   LABRPR Non Reactive 09/17/2022   LABRPR NON-REACTIVE 12/13/2021   LABRPR NON-REACTIVE 06/20/2021    STI Results GC CT  04/14/2023  3:14 PM Negative  Negative   09/17/2022 10:00 AM Negative  Negative   08/29/2022  3:43 PM Negative  Negative   02/14/2022  9:13 AM Negative    Negative  Negative    Negative   12/13/2021  9:09 AM Negative    Negative  Negative    Negative   07/29/2021  4:28 PM Negative  Negative   06/20/2021  9:20 AM Negative  Negative   12/21/2020  3:45 PM Negative  Negative   11/22/2020  9:58 AM Negative  Negative    10/25/2020  9:52 AM Negative  Negative   09/05/2019  4:31 PM Negative  Negative   10/16/2017 12:00 AM Negative  Negative   06/08/2017 12:00 AM Negative  Negative   10/14/2016 12:00 AM Negative  Negative   02/15/2016 12:00 AM Negative  Negative   07/13/2015 12:00 AM Negative  Negative   04/13/2015 12:00 AM Negative  Negative   09/24/2013 12:00 AM NG: Negative  CT: Negative   02/11/2011  3:36 PM  NEGATIVE   01/29/2010  7:00 PM  NEGATIVE     Hepatitis B Lab Results  Component Value Date   HEPBSAB REACTIVE (A) 09/08/2019   HEPBSAG Negative 09/17/2022   HEPBCAB NON-REACTIVE 09/08/2019   Hepatitis C Lab Results  Component Value Date   HEPCAB NON-REACTIVE 09/08/2019   Hepatitis A Lab Results  Component Value Date   HAV BORDERLINE (A) 09/08/2019   Lipids: Lab Results  Component Value Date   CHOL 183 11/22/2020   TRIG 80 11/22/2020   HDL 51 11/22/2020   CHOLHDL 3.6 11/22/2020   VLDL 10 02/14/2013   LDLCALC 115 (H) 11/22/2020    TARGET DATE: The 25th  Assessment: Candice [name] presents today for her maintenance Cabenuva  injections. Past injections were tolerated well without issues. Last HIV RNA was undetectable in January. Last CD4 count was 630 in January. Doing well  with no issues today. She is due for a lipid panel and Hep A antibody as she recently completed the vaccines series. She prefers to have these done at her next visit with Erla Haw along with her other maintenance labs.  Administered cabotegravir  600mg /77mL in left upper outer quadrant of the gluteal muscle. Administered rilpivirine  900 mg/3mL in the right upper outer quadrant of the gluteal muscle. No issues with injections. She will follow up in 2 months for next set of injections.  Eligible for Prevnar 20, Shingrix, and HPV vaccines today. She declines to receive these today, but says she is going to think about the Shingrix vaccine for next time.  Plan: - Cabenuva  injections administered - Next injections scheduled  for 01/08/24 with Erla Haw and 03/10/24 with Mylinda Asa - Due for lipid panel and Hep A antibody to be checked at next visit with Erla Haw. Orders placed today. - Call with any issues or questions  Georga Killings, PharmD PGY-1 Pharmacy Resident

## 2023-11-13 ENCOUNTER — Ambulatory Visit (INDEPENDENT_AMBULATORY_CARE_PROVIDER_SITE_OTHER): Admitting: Pharmacist

## 2023-11-13 ENCOUNTER — Other Ambulatory Visit: Payer: Self-pay

## 2023-11-13 DIAGNOSIS — Z21 Asymptomatic human immunodeficiency virus [HIV] infection status: Secondary | ICD-10-CM | POA: Diagnosis not present

## 2023-11-13 DIAGNOSIS — Z0000 Encounter for general adult medical examination without abnormal findings: Secondary | ICD-10-CM

## 2023-11-13 MED ORDER — CABOTEGRAVIR & RILPIVIRINE ER 600 & 900 MG/3ML IM SUER
1.0000 | Freq: Once | INTRAMUSCULAR | Status: AC
  Administered 2023-11-13: 1 via INTRAMUSCULAR

## 2023-11-18 NOTE — Progress Notes (Unsigned)
 Ben Jackson D.[facility] [address] [place] [address] Phone: [phone]   Assessment and Plan:     There are no diagnoses linked to this encounter.  *** - Patient has received relief with OMT in the past.  Elects for repeat OMT today.  Tolerated well per note below. - Decision today to treat with OMT was based on Physical Exam   After verbal consent patient was treated with HVLA (high velocity low amplitude), ME (muscle energy), FPR (flex positional release), ST (soft tissue), PC/PD (Pelvic Compression/ Pelvic Decompression) techniques in cervical, rib, thoracic, lumbar, and pelvic areas. Patient tolerated the procedure well with improvement in symptoms.  Patient educated on potential side effects of soreness and recommended to rest, hydrate, and use Tylenol  as needed for pain control.   Pertinent previous records reviewed include ***    Follow Up: ***     Subjective:   I, Candice [name], am serving as a Neurosurgeon for Doctor Candice [name]   Chief Complaint: thoracic back pain    HPI:    02/17/2022 Patient is a 38 year old female complaining of thoracic back pain. Patient states  Beginning around 2018, she began having intermittent episodes of numbness, weakness and dizziness.  In May or June of that year, she began having a recurrence of symptoms, including right flank pain and numbness radiating down her right leg with some associated right leg weakness.  She felt off balance.  She saw neurology at that time.  MRI of thoracic spine revealed subtle enhancing hyperintense focus at the T8-T9 region.would like to discuss OMT    03/10/2022 Patient states that she is alright, left side back is still tight and shoulders    04/07/2022 Patient states that she is okay left side is still crazy even with PT   05/05/2022 Patient states she is good needs a tune up   06/02/2022 Patient states that she is pretty good    06/30/2022 Patient states left side  is a problem , PT told her to re check rotator cuff   07/22/2022 Patient states she is pretty good, CSI was a life saver    09/09/2022 Patient states that she has been having intermittent flares    10/07/2022 Patient states that she would still like to get OMT while pregnant    11/04/2022 Patient states left side is  jacked up again    11/27/2022 Patient states shoulder neck and regular adjustment    01/02/2023 Patient states back is flared , but other than that pretty good    01/30/2023 Patient states left side is starting to act up and spasm    02/09/2023 Patient states that her shoulder pain has started coming back in the last 2-3 weeks and it is burning.   02/27/2023 Patient states that she is decent. Left sided neck tightness    09/16/2023 Patient states wants to discuss CSI options. And ready for an adjustment    10/22/2023 Patient states shots helped. Low back and neck are tight   11/19/2023 Patient states   Relevant Historical Information: Multiple sclerosis    Additional pertinent review of systems negative.  Current Outpatient Medications  Medication Sig Dispense Refill   acetaminophen  (TYLENOL ) 500 MG tablet Take 500 mg by mouth every 6 (six) hours as needed for mild pain.     cabotegravir  & rilpivirine  ER (CABENUVA ) 600 & 900 MG/3ML injection Inject 1 kit into the muscle every 2 (two) months. 6 mL 5  Cholecalciferol  (VITAMIN D ) 125 MCG (5000 UT) CAPS Take 5,000 Units by mouth daily.     No current facility-administered medications for this visit.      Objective:     There were no vitals filed for this visit.    There is no height or weight on file to calculate BMI.    Physical Exam:     General: Well-appearing, cooperative, sitting comfortably in no acute distress.   OMT Physical Exam:  ASIS Compression Test: Positive Right Cervical: TTP paraspinal, *** Rib: Bilateral elevated first rib with TTP Thoracic: TTP paraspinal,*** Lumbar: TTP  paraspinal,*** Pelvis: Right anterior innominate  Electronically signed by:  Marshall Skeeter D.[facility] 7:47 AM 11/18/23

## 2023-11-19 ENCOUNTER — Other Ambulatory Visit: Payer: Self-pay

## 2023-11-19 ENCOUNTER — Ambulatory Visit: Admitting: Sports Medicine

## 2023-11-19 VITALS — HR 87 | Ht 62.0 in | Wt 220.0 lb

## 2023-11-19 DIAGNOSIS — M545 Low back pain, unspecified: Secondary | ICD-10-CM | POA: Diagnosis not present

## 2023-11-19 DIAGNOSIS — G8929 Other chronic pain: Secondary | ICD-10-CM | POA: Diagnosis not present

## 2023-11-19 DIAGNOSIS — M542 Cervicalgia: Secondary | ICD-10-CM

## 2023-11-19 MED ORDER — MELOXICAM 15 MG PO TABS
15.0000 mg | ORAL_TABLET | Freq: Every day | ORAL | 0 refills | Status: DC
  Filled 2023-11-19: qty 30, 30d supply, fill #0

## 2023-11-19 NOTE — Patient Instructions (Signed)
-   Start meloxicam  15 mg daily x2 weeks.  If still having pain after 2 weeks, complete 3rd-week of NSAID. May use remaining NSAID as needed once daily for pain control.  Do not to use additional over-the-counter NSAIDs (ibuprofen , naproxen , Advil , Aleve , etc.) while taking prescription NSAIDs.  May use Tylenol  [phone] mg 2 to 3 times a day for breakthrough pain. 5 week follow up

## 2023-11-20 ENCOUNTER — Other Ambulatory Visit: Payer: Self-pay

## 2023-12-01 ENCOUNTER — Encounter: Payer: Self-pay | Admitting: *Deleted

## 2023-12-15 NOTE — Progress Notes (Signed)
 [name] [address] [place] [address] Phone: [phone]   Assessment and Plan:     1. Neck pain 2. Chronic bilateral low back pain without sciatica 3. Somatic dysfunction of cervical region 4. Somatic dysfunction of thoracic region 5. Somatic dysfunction of lumbar region 6. Somatic dysfunction of pelvic region 7. Somatic dysfunction of rib region -Chronic with exacerbation, subsequent visit - Recurrence of musculoskeletal pain, predominantly in neck, upper back, lower back.  Patient has had benefit with OMT, trigger point injections, meloxicam  course - Use meloxicam  15 mg daily as needed for pain.  Recommend limiting chronic NSAIDs to 1-2 doses per week to prevent long-term side effects. - Use Tylenol  500 to 1000 mg tablets 2-3 times a day for day-to-day pain relief  - Patient has received relief with OMT in the past.  Elects for repeat OMT today.  Tolerated well per note below. - Decision today to treat with OMT was based on Physical Exam   After verbal consent patient was treated with HVLA (high velocity low amplitude), ME (muscle energy), FPR (flex positional release), ST (soft tissue), PC/PD (Pelvic Compression/ Pelvic Decompression) techniques in cervical, rib, thoracic, lumbar, and pelvic areas. Patient tolerated the procedure well with improvement in symptoms.  Patient educated on potential side effects of soreness and recommended to rest, hydrate, and use Tylenol  as needed for pain control.    8. Right lateral epicondylitis -Chronic with exacerbation, initial visit - Consistent with right lateral epicondylitis likely from increased, repetitive physical activity -Start HEP for lateral epicondylitis - May use wrist brace for the next 2 weeks to decrease strain over wrist and elbow - Use meloxicam  15 mg daily as needed for pain.  Recommend limiting chronic NSAIDs to 1-2 doses per week to prevent long-term side effects. - Use  Tylenol  500 to 1000 mg tablets 2-3 times a day for day-to-day pain relief  15 additional minutes spent for educating Therapeutic Home Exercise Program.  This included exercises focusing on stretching, strengthening, with focus on eccentric aspects.   Long term goals include an improvement in range of motion, strength, endurance as well as avoiding reinjury. Patient's frequency would include in 1-2 times a day, 3-5 times a week for a duration of 6-12 weeks. Proper technique shown and discussed handout in great detail with ATC.  All questions were discussed and answered.    Pertinent previous records reviewed include none  Follow Up: 4 weeks for reevaluation.  Could consider repeat OMT versus trigger point injections.  If no improvement in right arm, elbow pain, could consider physical therapy   Subjective:   I, Candice [name] am a scribe for Dr. Leonce.     Chief Complaint: thoracic back pain    HPI:    02/17/2022 Patient is a 38 year old female complaining of thoracic back pain. Patient states  Beginning around 2018, she began having intermittent episodes of numbness, weakness and dizziness.  In May or June of that year, she began having a recurrence of symptoms, including right flank pain and numbness radiating down her right leg with some associated right leg weakness.  She felt off balance.  She saw neurology at that time.  MRI of thoracic spine revealed subtle enhancing hyperintense focus at the T8-T9 region.would like to discuss OMT    03/10/2022 Patient states that she is alright, left side back is still tight and shoulders    04/07/2022 Patient states that she is okay left side is still  crazy even with PT   05/05/2022 Patient states she is good needs a tune up   06/02/2022 Patient states that she is pretty good    06/30/2022 Patient states left side is a problem , PT told her to re check rotator cuff   07/22/2022 Patient states she is pretty good, CSI was a life saver     09/09/2022 Patient states that she has been having intermittent flares    10/07/2022 Patient states that she would still like to get OMT while pregnant    11/04/2022 Patient states left side is  jacked up again    11/27/2022 Patient states shoulder neck and regular adjustment    01/02/2023 Patient states back is flared , but other than that pretty good    01/30/2023 Patient states left side is starting to act up and spasm    02/09/2023 Patient states that her shoulder pain has started coming back in the last 2-3 weeks and it is burning.   02/27/2023 Patient states that she is decent. Left sided neck tightness    09/16/2023 Patient states wants to discuss CSI options. And ready for an adjustment    10/22/2023 Patient states shots helped. Low back and neck are tight    11/19/2023 Patient states left side low back is flared   12/24/2023 Patient states ok but hurting a little bit. The whole back and the right arm. Right arm has been swollen, elbow has been hurting, and she has been wearing a brace. Some swelling in the right hand as well. No injury to the arm. 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    Relevant Historical Information: Multiple sclerosis  Additional pertinent review of systems negative.  Current Outpatient Medications  Medication Sig Dispense Refill   acetaminophen  (TYLENOL ) 500 MG tablet Take 500 mg by mouth every 6 (six) hours as needed for mild pain.     cabotegravir  & rilpivirine  ER (CABENUVA ) 600 & 900 MG/3ML injection Inject 1 kit into the muscle every 2 (two) months. 6 mL 5   Cholecalciferol  (VITAMIN D ) 125 MCG (5000 UT) CAPS Take 5,000 Units by mouth daily.     meloxicam  (MOBIC ) 15 MG tablet Take 1 tablet (15 mg total) by mouth as needed for pain. 30 tablet 0   No current facility-administered medications for this visit.      Objective:     Vitals:   12/24/23 0934  BP: 110/60  Pulse: 75  SpO2: 97%   Weight: 222 lb (100.7 kg)  Height: 5' 2 (1.575 m)      Body mass index is 40.6 kg/m.    Physical Exam:     General: Well-appearing, cooperative, sitting comfortably in no acute distress.   OMT Physical Exam:  ASIS Compression Test: Positive Right Cervical: TTP paraspinal, C3-5 RLSL Rib: Bilateral elevated first rib with TTP on left Thoracic: TTP paraspinal, T5-8 RRSL Lumbar: TTP paraspinal, L1-3 RRSL Pelvis: Right anterior innominate  Electronically signed by:  Odis Mace D.[facility] 10:04 AM 12/24/23

## 2023-12-24 ENCOUNTER — Other Ambulatory Visit: Payer: Self-pay

## 2023-12-24 ENCOUNTER — Ambulatory Visit: Admitting: Sports Medicine

## 2023-12-24 VITALS — BP 110/60 | HR 75 | Ht 62.0 in | Wt 222.0 lb

## 2023-12-24 DIAGNOSIS — M9902 Segmental and somatic dysfunction of thoracic region: Secondary | ICD-10-CM | POA: Diagnosis not present

## 2023-12-24 DIAGNOSIS — G8929 Other chronic pain: Secondary | ICD-10-CM

## 2023-12-24 DIAGNOSIS — M542 Cervicalgia: Secondary | ICD-10-CM

## 2023-12-24 DIAGNOSIS — M9901 Segmental and somatic dysfunction of cervical region: Secondary | ICD-10-CM

## 2023-12-24 DIAGNOSIS — M9905 Segmental and somatic dysfunction of pelvic region: Secondary | ICD-10-CM

## 2023-12-24 DIAGNOSIS — M7711 Lateral epicondylitis, right elbow: Secondary | ICD-10-CM | POA: Diagnosis not present

## 2023-12-24 DIAGNOSIS — M9908 Segmental and somatic dysfunction of rib cage: Secondary | ICD-10-CM

## 2023-12-24 DIAGNOSIS — M9903 Segmental and somatic dysfunction of lumbar region: Secondary | ICD-10-CM

## 2023-12-24 DIAGNOSIS — M545 Low back pain, unspecified: Secondary | ICD-10-CM | POA: Diagnosis not present

## 2023-12-24 MED ORDER — MELOXICAM 15 MG PO TABS
15.0000 mg | ORAL_TABLET | ORAL | 0 refills | Status: DC | PRN
  Filled 2023-12-24: qty 30, 30d supply, fill #0

## 2023-12-24 NOTE — Patient Instructions (Addendum)
-   Use meloxicam  15 mg daily as needed for pain.  Recommend limiting chronic NSAIDs to 1-2 doses per week to prevent long-term side effects.  New HEP for tennis elbow - Use Tylenol  500 to 1000 mg tablets 2-3 times a day for day-to-day pain relief Follow up in 4 weeks.

## 2023-12-28 ENCOUNTER — Other Ambulatory Visit (HOSPITAL_COMMUNITY): Payer: Self-pay

## 2023-12-28 ENCOUNTER — Other Ambulatory Visit: Payer: Self-pay

## 2023-12-28 NOTE — Progress Notes (Signed)
 Specialty Pharmacy Refill Coordination Note  Candice [name] is a 38 y.o. female assessed today regarding refills of clinic administered specialty medication(s) Cabotegravir  & Rilpivirine  (CABENUVA )   Clinic requested Courier to Provider Office   Delivery date: 01/04/24   Verified address: [address] [place] [address]   Medication will be filled on 01/01/24.

## 2024-01-01 ENCOUNTER — Other Ambulatory Visit: Payer: Self-pay

## 2024-01-04 ENCOUNTER — Telehealth: Payer: Self-pay

## 2024-01-04 NOTE — Telephone Encounter (Signed)
 [name]  Patient's medications Cabenuva  have been couriered to [facility] from [facility] and will be administered at the patients appointment on 01/08/24.  Arland Hutchinson, CPhT Specialty Pharmacy Patient [facility] for Infectious Disease Phone: [phone] Fax:  [phone]

## 2024-01-08 ENCOUNTER — Encounter: Payer: Self-pay | Admitting: Family

## 2024-01-08 ENCOUNTER — Ambulatory Visit: Payer: Self-pay | Admitting: Family

## 2024-01-08 ENCOUNTER — Other Ambulatory Visit: Payer: Self-pay

## 2024-01-08 VITALS — BP 127/86 | HR 82 | Temp 98.2°F | Wt 223.0 lb

## 2024-01-08 DIAGNOSIS — Z8659 Personal history of other mental and behavioral disorders: Secondary | ICD-10-CM | POA: Diagnosis not present

## 2024-01-08 DIAGNOSIS — Z720 Tobacco use: Secondary | ICD-10-CM

## 2024-01-08 DIAGNOSIS — B20 Human immunodeficiency virus [HIV] disease: Secondary | ICD-10-CM

## 2024-01-08 DIAGNOSIS — Z21 Asymptomatic human immunodeficiency virus [HIV] infection status: Secondary | ICD-10-CM

## 2024-01-08 DIAGNOSIS — Z0000 Encounter for general adult medical examination without abnormal findings: Secondary | ICD-10-CM

## 2024-01-08 MED ORDER — SERTRALINE HCL 25 MG PO TABS
25.0000 mg | ORAL_TABLET | Freq: Every day | ORAL | 2 refills | Status: DC
  Filled 2024-01-08: qty 30, 30d supply, fill #0
  Filled 2024-02-18 – 2024-03-01 (×2): qty 30, 30d supply, fill #1
  Filled 2024-04-13: qty 30, 30d supply, fill #2

## 2024-01-08 MED ORDER — CABOTEGRAVIR & RILPIVIRINE ER 600 & 900 MG/3ML IM SUER
1.0000 | Freq: Once | INTRAMUSCULAR | Status: AC
  Administered 2024-01-08: 1 via INTRAMUSCULAR

## 2024-01-08 NOTE — Assessment & Plan Note (Signed)
 Continues to smoke tobacco. Counseled on the dangers of tobacco not ready to quit at this time.  Reviewed strategies to maximize success, including removing cigarettes and smoking materials from environment, stress management, substitution of other forms of reinforcement, support of family/friends, and written materials.

## 2024-01-08 NOTE — Assessment & Plan Note (Signed)
 Ms. Impastato continues to have well-controlled virus with good adherence and tolerance to Cabenuva .  Reviewed previous lab work and discussed plan of care and U equals U.  Social determinants of health reviewed with no interventions indicated.  Check blood work.  Continue current dose of Cabenuva  with next injection provided without complication.  Plan for follow-up in 6 months or sooner if needed and with pharmacy providers in between.

## 2024-01-08 NOTE — Assessment & Plan Note (Signed)
 Increased levels of PHQ-9 and GAD-7 related to increased stressors.  Discussed counseling and supportive resources.  Information provided in after visit summary.  Following discussion we will start sertraline  which she has used in the past at 25 mg daily.  Discussed that this may take 4 to 6 weeks to start working and we will start low and go slow.  No suicidal ideations or signs of psychosis.  She will follow-up via MyChart and next office visit.

## 2024-01-08 NOTE — Assessment & Plan Note (Signed)
 Not currently sexually active. Awaiting appointment with [facility] for routine dental care. Vaccinations reviewed and following counseling declined. Cervical cancer screening up-to-date.

## 2024-01-08 NOTE — Patient Instructions (Addendum)
 Nice to see you.  We will check your lab work today.  Plan for follow up in 6 months or sooner if needed with lab work on the same day and pharmacy provider in between.   Have a great day and stay safe!   Mental Health Resources  988: can call or text 24/7   [facility]: Address: [address], [place], [place] [address] Open 24 hours Phone: [phone]  [facility] [place]: Address: [address] [place], [place] [address] Phone: [phone] Appointments: [email or web address]  Smoking Cessation: Quitline[place] 1-800-QUIT-NOW (336)646-2454); Espaol: 1-855-Djelo-Ya (1-305 153 4092) [email or web address]

## 2024-01-08 NOTE — Progress Notes (Signed)
 Brief Narrative   Patient ID: Candice [name], female    DOB: 1985/12/13, 38 y.o.   MRN: [id number]  Candice [name] is a 38 y/o female diagnosed with HIV-1 disease diagnosed in March 2021 with risk factor of heterosexual contact. Initial CD4 count was 757 and viral load of 421. Genotype was Subtype B with no significant medication resistant mutations. No history of opportunistic infection. [id number] negative. Entered care at [facility] Stage 1. Previous ART exposure to Elfrida and currently Cabenuva .   Subjective:   Chief Complaint  Patient presents with   Follow-up    B20    HPI:  Candice [name] is a 38 y.o. female with HIV disease last seen on 11/13/2023 with well-controlled virus and good adherence and tolerance to Cabenuva .  Viral load was undetectable with CD4 count 630.  Here today for routine follow-up.  Candice [name] has been doing okay since her last office visit and continues to receive Cabenuva  with no adverse side effects.  Covered by [employer].  Has increased levels of stress/anxiety recently.  Elevated PHQ-9 and GAD-7 scores.  No suicidal ideations.  Has previously been on sertraline  which has helped.  Interested in counseling.  Housing, transportation, and access to food are stable.  Healthcare maintenance reviewed.  Not currently sexually active.  Continues to smoke tobacco daily.   Denies fevers, chills, night sweats, headaches, changes in vision, neck pain/stiffness, nausea, diarrhea, vomiting, lesions or rashes.  Lab Results  Component Value Date   CD4TCELL 55 07/20/2023   CD4TABS 630 07/20/2023   Lab Results  Component Value Date   HIV1RNAQUANT Not Detected 07/20/2023     Allergies  Allergen Reactions   Contrast Media [Iodinated Contrast Media] Nausea And Vomiting      Outpatient Medications Prior to Visit  Medication Sig Dispense Refill   acetaminophen  (TYLENOL ) 500 MG tablet Take 500 mg by mouth every 6 (six) hours as needed for mild pain.      cabotegravir  & rilpivirine  ER (CABENUVA ) 600 & 900 MG/3ML injection Inject 1 kit into the muscle every 2 (two) months. 6 mL 5   Cholecalciferol  (VITAMIN D ) 125 MCG (5000 UT) CAPS Take 5,000 Units by mouth daily.     meloxicam  (MOBIC ) 15 MG tablet Take 1 tablet (15 mg total) by mouth as needed for pain. 30 tablet 0   No facility-administered medications prior to visit.     Past Medical History:  Diagnosis Date   Abnormal MRI, spine 04/06/2020   Alpha thalassemia silent carrier 11/28/2022   No show genetic counseling virtual visit 6/3 - reschedule if desired     FOB declined testing.      Arthralgia 02/18/2022   Back pain 10/13/2011   Complication of anesthesia    epidural with last pregnancy made entire left side numb, had to d/c   Depression    GDM (gestational diabetes mellitus) 02/11/2023   GERD (gastroesophageal reflux disease) 05/21/2020   Gestational hypertension    History of reversal of tubal ligation 03/17/2023   HIV-1 (human immunodeficiency virus I) (HCC)    HSV 06/28/2007   Major depressive disorder, recurrent episode, moderate (HCC) 06/08/2013   Migraines    Multiple sclerosis (HCC)    Right leg numbness 02/07/2020   Vitamin D  deficiency 06/20/2022     Past Surgical History:  Procedure Laterality Date   BREAST SURGERY  2019   reduction   CESAREAN SECTION     CESAREAN SECTION  09/07/2011   Procedure: CESAREAN  SECTION;  Surgeon: Vonn VEAR Inch, MD;  Location: [facility];  Service: Gynecology;  Laterality: N/A;  Repeat cesarean section with delivery of baby boy at 25. Apgars 9/9.  Bilateral tubal ligation.   CESAREAN SECTION N/A 04/25/2023   Procedure: CESAREAN SECTION;  Surgeon: Nicholaus Burnard HERO, MD;  Location: [facility];  Service: Obstetrics;  Laterality: N/A;   IUD REMOVAL  2005   in cervix   TUBAL LIGATION     tubal reanastomosis  08/13/2016        Review of Systems  Constitutional:  Negative for appetite change, chills, diaphoresis, fatigue, fever and  unexpected weight change.  Eyes:        Negative for acute change in vision  Respiratory:  Negative for chest tightness, shortness of breath and wheezing.   Cardiovascular:  Negative for chest pain.  Gastrointestinal:  Negative for diarrhea, nausea and vomiting.  Genitourinary:  Negative for dysuria, pelvic pain and vaginal discharge.  Musculoskeletal:  Negative for neck pain and neck stiffness.  Skin:  Negative for rash.  Neurological:  Negative for seizures, syncope, weakness and headaches.  Hematological:  Negative for adenopathy. Does not bruise/bleed easily.  Psychiatric/Behavioral:  Negative for hallucinations.      Objective:   BP 127/86   Pulse 82   Temp 98.2 F (36.8 C) (Oral)   Wt 223 lb (101.2 kg)   LMP 12/24/2023 (Exact Date)   SpO2 98%   BMI 40.79 kg/m  Nursing note and vital signs reviewed.  Physical Exam Constitutional:      General: She is not in acute distress.    Appearance: She is well-developed.  Eyes:     Conjunctiva/sclera: Conjunctivae normal.  Cardiovascular:     Rate and Rhythm: Normal rate and regular rhythm.     Heart sounds: Normal heart sounds. No murmur heard.    No friction rub. No gallop.  Pulmonary:     Effort: Pulmonary effort is normal. No respiratory distress.     Breath sounds: Normal breath sounds. No wheezing or rales.  Chest:     Chest wall: No tenderness.  Abdominal:     General: Bowel sounds are normal.     Palpations: Abdomen is soft.     Tenderness: There is no abdominal tenderness.  Musculoskeletal:     Cervical back: Neck supple.  Lymphadenopathy:     Cervical: No cervical adenopathy.  Skin:    General: Skin is warm and dry.     Findings: No rash.  Neurological:     Mental Status: She is alert and oriented to person, place, and time.  Psychiatric:        Behavior: Behavior normal.        Thought Content: Thought content normal.        Judgment: Judgment normal.          01/08/2024   11:26 AM 07/20/2023     9:51 AM 02/23/2023    9:33 AM 11/10/2022    3:46 PM 10/14/2022    3:09 PM  Depression screen PHQ 2/9  Decreased Interest 1 0 0 1 1  Down, Depressed, Hopeless 1 0 0 0 0  PHQ - 2 Score 2 0 0 1 1  Altered sleeping 0   1 1  Tired, decreased energy 3   1 2   Change in appetite 3   1 1   Feeling bad or failure about yourself  1   1   Trouble concentrating 0   0 0  Moving  slowly or fidgety/restless 0   0 0  Suicidal thoughts 0   0 0  PHQ-9 Score 9   5 5   Difficult doing work/chores Not difficult at all            01/08/2024   11:27 AM 11/10/2022    3:50 PM 06/10/2021    2:30 PM 04/28/2016   10:03 AM  GAD 7 : Generalized Anxiety Score  Nervous, Anxious, on Edge 1 1 3 1   Control/stop worrying 1 2 3 1   Worry too much - different things 1 3 3 2   Trouble relaxing 2 2 3 2   Restless 0 0 2 1  Easily annoyed or irritable 3 1 3 2   Afraid - awful might happen 3 0 2 0  Total GAD 7 Score 11 9 19 9   Anxiety Difficulty Somewhat difficult  Very difficult Somewhat difficult       Assessment & Plan:    Patient Active Problem List   Diagnosis Date Noted   History of depression 01/13/2022   MS (multiple sclerosis) (HCC) 05/21/2020   Healthcare maintenance 02/10/2020   HIV-1 (human immunodeficiency virus I) (HCC) 09/07/2019   Genital herpes 02/16/2013     Problem List Items Addressed This Visit       Other   HIV-1 (human immunodeficiency virus I) (HCC) - Primary   Candice [name] continues to have well-controlled virus with good adherence and tolerance to Cabenuva .  Reviewed previous lab work and discussed plan of care and U equals U.  Social determinants of health reviewed with no interventions indicated.  Check blood work.  Continue current dose of Cabenuva  with next injection provided without complication.  Plan for follow-up in 6 months or sooner if needed and with pharmacy providers in between.      Relevant Orders   Comprehensive metabolic panel with GFR   HIV-1 RNA quant-no reflex-bld    T-helper cells (CD4) count (not at [facility])   Healthcare maintenance   Not currently sexually active. Awaiting appointment with [facility] for routine [facility] care. Vaccinations reviewed and following counseling declined. Cervical cancer screening up-to-date.      History of depression   Increased levels of PHQ-9 and GAD-7 related to increased stressors.  Discussed counseling and supportive resources.  Information provided in after visit summary.  Following discussion we will start sertraline  which she has used in the past at 25 mg daily.  Discussed that this may take 4 to 6 weeks to start working and we will start low and go slow.  No suicidal ideations or signs of psychosis.  She will follow-up via MyChart and next office visit.        I am having Candice [name] start on sertraline . I am also having her maintain her acetaminophen , Vitamin D , cabotegravir  & rilpivirine  ER, and meloxicam . We administered cabotegravir  & rilpivirine  ER.   Meds ordered this encounter  Medications   sertraline  (ZOLOFT ) 25 MG tablet    Sig: Take 1 tablet (25 mg total) by mouth daily.    Dispense:  30 tablet    Refill:  2    Supervising Provider:   SNIDER, CYNTHIA [[id number]]   cabotegravir  & rilpivirine  ER (CABENUVA ) 600 & 900 MG/3ML injection 1 kit     Follow-up: Return in about 6 months (around 07/10/2024). or sooner if needed.    Cathlyn July, MSN, FNP-C Nurse Practitioner [facility] Main number: [phone]

## 2024-01-13 ENCOUNTER — Ambulatory Visit: Payer: Self-pay | Admitting: Family

## 2024-01-13 LAB — COMPREHENSIVE METABOLIC PANEL WITH GFR
AG Ratio: 1.4 (calc) (ref 1.0–2.5)
ALT: 13 U/L (ref 6–29)
AST: 12 U/L (ref 10–30)
Albumin: 3.9 g/dL (ref 3.6–5.1)
Alkaline phosphatase (APISO): 52 U/L (ref 31–125)
BUN: 12 mg/dL (ref 7–25)
CO2: 26 mmol/L (ref 20–32)
Calcium: 9.1 mg/dL (ref 8.6–10.2)
Chloride: 106 mmol/L (ref 98–110)
Creat: 0.7 mg/dL (ref 0.50–0.97)
Globulin: 2.7 g/dL (ref 1.9–3.7)
Glucose, Bld: 85 mg/dL (ref 65–99)
Potassium: 4 mmol/L (ref 3.5–5.3)
Sodium: 139 mmol/L (ref 135–146)
Total Bilirubin: 0.3 mg/dL (ref 0.2–1.2)
Total Protein: 6.6 g/dL (ref 6.1–8.1)
eGFR: 113 mL/min/1.73m2 (ref >=60)

## 2024-01-13 LAB — HIV-1 RNA QUANT-NO REFLEX-BLD
HIV 1 RNA Quant: <20 {copies}/mL — AB
HIV-1 RNA Quant, Log: <1.3 {Log_copies}/mL — AB

## 2024-01-13 LAB — T-HELPER CELLS (CD4) COUNT (NOT AT ARMC)
Absolute CD4: 1521 {cells}/uL (ref 490–1740)
CD4 T Helper %: 49 % (ref 30–61)
Total lymphocyte count: 3086 {cells}/uL (ref 850–3900)

## 2024-01-18 ENCOUNTER — Other Ambulatory Visit: Payer: Self-pay

## 2024-01-19 ENCOUNTER — Other Ambulatory Visit: Payer: Self-pay

## 2024-01-20 NOTE — Progress Notes (Unsigned)
 Ben Jackson D.[facility] [address] [place] [address] Phone: [phone]   Assessment and Plan:     There are no diagnoses linked to this encounter.  *** - Patient has received relief with OMT in the past.  Elects for repeat OMT today.  Tolerated well per note below. - Decision today to treat with OMT was based on Physical Exam   After verbal consent patient was treated with HVLA (high velocity low amplitude), ME (muscle energy), FPR (flex positional release), ST (soft tissue), PC/PD (Pelvic Compression/ Pelvic Decompression) techniques in cervical, rib, thoracic, lumbar, and pelvic areas. Patient tolerated the procedure well with improvement in symptoms.  Patient educated on potential side effects of soreness and recommended to rest, hydrate, and use Tylenol  as needed for pain control.   Pertinent previous records reviewed include ***    Follow Up: ***     Subjective:   I, Jeven Topper, am serving as a Neurosurgeon for Doctor Morene Mace  Chief Complaint: thoracic back pain    HPI:    02/17/2022 Patient is a 38 year old female complaining of thoracic back pain. Patient states  Beginning around 2018, she began having intermittent episodes of numbness, weakness and dizziness.  In May or June of that year, she began having a recurrence of symptoms, including right flank pain and numbness radiating down her right leg with some associated right leg weakness.  She felt off balance.  She saw neurology at that time.  MRI of thoracic spine revealed subtle enhancing hyperintense focus at the T8-T9 region.would like to discuss OMT    03/10/2022 Patient states that she is alright, left side back is still tight and shoulders    04/07/2022 Patient states that she is okay left side is still crazy even with PT   05/05/2022 Patient states she is good needs a tune up   06/02/2022 Patient states that she is pretty good    06/30/2022 Patient states left side  is a problem , PT told her to re check rotator cuff   07/22/2022 Patient states she is pretty good, CSI was a life saver    09/09/2022 Patient states that she has been having intermittent flares    10/07/2022 Patient states that she would still like to get OMT while pregnant    11/04/2022 Patient states left side is  jacked up again    11/27/2022 Patient states shoulder neck and regular adjustment    01/02/2023 Patient states back is flared , but other than that pretty good    01/30/2023 Patient states left side is starting to act up and spasm    02/09/2023 Patient states that her shoulder pain has started coming back in the last 2-3 weeks and it is burning.   02/27/2023 Patient states that she is decent. Left sided neck tightness    09/16/2023 Patient states wants to discuss CSI options. And ready for an adjustment    10/22/2023 Patient states shots helped. Low back and neck are tight    11/19/2023 Patient states left side low back is flared    12/24/2023 Patient states ok but hurting a little bit. The whole back and the right arm. Right arm has been swollen, elbow has been hurting, and she has been wearing a brace. Some swelling in the right hand as well. No injury to the arm. 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   01/21/2024 Patient states  Relevant Historical Information: Multiple sclerosis  Additional pertinent review of systems negative.  Current Outpatient Medications  Medication Sig Dispense Refill   acetaminophen  (TYLENOL ) 500 MG tablet Take 500 mg by mouth every 6 (six) hours as needed for mild pain.     cabotegravir  & rilpivirine  ER (CABENUVA ) 600 & 900 MG/3ML injection Inject 1 kit into the muscle every 2 (two) months. 6 mL 5   Cholecalciferol  (VITAMIN D ) 125 MCG (5000 UT) CAPS Take 5,000 Units by mouth daily.     meloxicam  (MOBIC ) 15 MG tablet Take 1 tablet (15 mg total) by mouth as needed for pain. 30 tablet 0    sertraline  (ZOLOFT ) 25 MG tablet Take 1 tablet (25 mg total) by mouth daily. 30 tablet 2   No current facility-administered medications for this visit.      Objective:     There were no vitals filed for this visit.    There is no height or weight on file to calculate BMI.    Physical Exam:     General: Well-appearing, cooperative, sitting comfortably in no acute distress.   OMT Physical Exam:  ASIS Compression Test: Positive Right Cervical: TTP paraspinal, *** Rib: Bilateral elevated first rib with TTP Thoracic: TTP paraspinal,*** Lumbar: TTP paraspinal,*** Pelvis: Right anterior innominate  Electronically signed by:  Odis Mace D.[facility] 7:41 AM 01/20/24

## 2024-01-21 ENCOUNTER — Ambulatory Visit: Admitting: Sports Medicine

## 2024-01-21 VITALS — BP 132/82 | HR 82 | Ht 62.0 in | Wt 223.0 lb

## 2024-01-21 DIAGNOSIS — G8929 Other chronic pain: Secondary | ICD-10-CM | POA: Diagnosis not present

## 2024-01-21 DIAGNOSIS — M7711 Lateral epicondylitis, right elbow: Secondary | ICD-10-CM

## 2024-01-21 DIAGNOSIS — M545 Low back pain, unspecified: Secondary | ICD-10-CM

## 2024-01-21 DIAGNOSIS — M9903 Segmental and somatic dysfunction of lumbar region: Secondary | ICD-10-CM

## 2024-01-21 DIAGNOSIS — M9902 Segmental and somatic dysfunction of thoracic region: Secondary | ICD-10-CM

## 2024-01-21 DIAGNOSIS — M9901 Segmental and somatic dysfunction of cervical region: Secondary | ICD-10-CM

## 2024-01-21 DIAGNOSIS — M542 Cervicalgia: Secondary | ICD-10-CM | POA: Diagnosis not present

## 2024-01-21 DIAGNOSIS — M9905 Segmental and somatic dysfunction of pelvic region: Secondary | ICD-10-CM

## 2024-01-21 DIAGNOSIS — M9908 Segmental and somatic dysfunction of rib cage: Secondary | ICD-10-CM

## 2024-01-21 NOTE — Patient Instructions (Addendum)
 PT referral  - Use meloxicam  15 mg daily as needed for pain.  Recommend limiting chronic NSAIDs to 1-2 doses per week to prevent long-term side effects.  4 week follow up

## 2024-02-06 ENCOUNTER — Ambulatory Visit: Attending: Sports Medicine

## 2024-02-06 ENCOUNTER — Other Ambulatory Visit: Payer: Self-pay

## 2024-02-06 DIAGNOSIS — M9908 Segmental and somatic dysfunction of rib cage: Secondary | ICD-10-CM | POA: Insufficient documentation

## 2024-02-06 DIAGNOSIS — M7711 Lateral epicondylitis, right elbow: Secondary | ICD-10-CM | POA: Diagnosis not present

## 2024-02-06 DIAGNOSIS — M546 Pain in thoracic spine: Secondary | ICD-10-CM | POA: Insufficient documentation

## 2024-02-06 DIAGNOSIS — G8929 Other chronic pain: Secondary | ICD-10-CM | POA: Diagnosis not present

## 2024-02-06 DIAGNOSIS — M9901 Segmental and somatic dysfunction of cervical region: Secondary | ICD-10-CM | POA: Diagnosis not present

## 2024-02-06 DIAGNOSIS — M542 Cervicalgia: Secondary | ICD-10-CM | POA: Insufficient documentation

## 2024-02-06 DIAGNOSIS — M79605 Pain in left leg: Secondary | ICD-10-CM | POA: Diagnosis not present

## 2024-02-06 DIAGNOSIS — M9902 Segmental and somatic dysfunction of thoracic region: Secondary | ICD-10-CM | POA: Insufficient documentation

## 2024-02-06 DIAGNOSIS — M79604 Pain in right leg: Secondary | ICD-10-CM | POA: Insufficient documentation

## 2024-02-06 DIAGNOSIS — M9903 Segmental and somatic dysfunction of lumbar region: Secondary | ICD-10-CM | POA: Insufficient documentation

## 2024-02-06 DIAGNOSIS — R293 Abnormal posture: Secondary | ICD-10-CM | POA: Diagnosis not present

## 2024-02-06 DIAGNOSIS — M545 Low back pain, unspecified: Secondary | ICD-10-CM | POA: Diagnosis not present

## 2024-02-06 DIAGNOSIS — M6281 Muscle weakness (generalized): Secondary | ICD-10-CM | POA: Diagnosis not present

## 2024-02-06 DIAGNOSIS — M9905 Segmental and somatic dysfunction of pelvic region: Secondary | ICD-10-CM | POA: Insufficient documentation

## 2024-02-06 NOTE — Therapy (Signed)
 OUTPATIENT PHYSICAL THERAPY THORACOLUMBAR EVALUATION   Patient Name: Candice [name] MRN: [id number] DOB:[birth year], 38 y.o., female Today's Date: 02/06/2024  END OF SESSION:  PT End of Session - 02/06/24 0813     Visit Number 1    Date for PT Re-Evaluation 04/09/24    Authorization Type [employer], MCR    Progress Note Due on Visit 10    PT Start Time 0814    PT Stop Time 0906    PT Time Calculation (min) 52 min    Activity Tolerance Patient tolerated treatment well    Behavior During Therapy [facility] for tasks assessed/performed          Past Medical History:  Diagnosis Date   Abnormal MRI, spine 04/06/2020   Alpha thalassemia silent carrier 11/28/2022   No show genetic counseling virtual visit 6/3 - reschedule if desired     FOB declined testing.      Arthralgia 02/18/2022   Back pain 10/13/2011   Complication of anesthesia    epidural with last pregnancy made entire left side numb, had to d/c   Depression    GDM (gestational diabetes mellitus) 02/11/2023   GERD (gastroesophageal reflux disease) 05/21/2020   Gestational hypertension    History of reversal of tubal ligation 03/17/2023   HIV-1 (human immunodeficiency virus I) (HCC)    HSV 06/28/2007   Major depressive disorder, recurrent episode, moderate (HCC) 06/08/2013   Migraines    Multiple sclerosis (HCC)    Right leg numbness 02/07/2020   Vitamin D  deficiency 06/20/2022   Past Surgical History:  Procedure Laterality Date   BREAST SURGERY  2019   reduction   CESAREAN SECTION     CESAREAN SECTION  09/07/2011   Procedure: CESAREAN SECTION;  Surgeon: Vonn VEAR Inch, MD;  Location: [facility];  Service: Gynecology;  Laterality: N/A;  Repeat cesarean section with delivery of baby boy at 66. Apgars 9/9.  Bilateral tubal ligation.   CESAREAN SECTION N/A 04/25/2023   Procedure: CESAREAN SECTION;  Surgeon: Nicholaus Burnard HERO, MD;  Location: [facility];  Service: Obstetrics;  Laterality: N/A;   IUD REMOVAL  2005   in cervix    TUBAL LIGATION     tubal reanastomosis  08/13/2016   Patient Active Problem List   Diagnosis Date Noted   History of depression 01/13/2022   MS (multiple sclerosis) (HCC) 05/21/2020   Healthcare maintenance 02/10/2020   HIV-1 (human immunodeficiency virus I) (HCC) 09/07/2019   Genital herpes 02/16/2013   Tobacco use 03/31/2009    PCP: Kathrine Melena, DO  REFERRING PROVIDER: Morene Mace, DO  REFERRING DIAG: Cervical, thoracic, lumbar pain  Rationale for Evaluation and Treatment: Rehabilitation  THERAPY DIAG:  Cervical pain (neck)  Lumbar pain with radiation down both legs  Muscle weakness (generalized)  Abnormal posture  Pain in thoracic spine  ONSET DATE: 04/25/2023 after birth of child  SUBJECTIVE:  SUBJECTIVE STATEMENT: Patient reports that her pain tends be in her left cervical and thoracic region. But patient reports that she can have pain in her whole body from her neck down to her lumbar spine. Also reports multiple instances per week when pain radiates from lumbar spine to bilateral LE with right LE being worse down to right foot. Reports radiating symptoms only down to left mid calf. Patient reports pain is worse with increased activity. Patient reports regular course of steroid injections with Dr. Leonce. States most recent injections were in left cervical and thoracic region. Patient reports that her left cervical and thoracic region are worst areas.   PERTINENT HISTORY:  HIV, MS, Depression  PAIN:  Are you having pain? Yes: NPRS scale: 2/10 currently. 10/10 at worst after activity Pain location: At eval addressing left cervical and thoracic Pain description: deep throbbing/ache, numbness/tingling in right foot/leg, occasionally has sharp/stabbing pains Aggravating factors:  lifting, carrying child, standing more than 20 minutes, squatting, floor transitions Relieving factors: ibuprofen , stretches, heat/hot showers  PRECAUTIONS: None  RED FLAGS: None   WEIGHT BEARING RESTRICTIONS: No  FALLS:  Has patient fallen in last 6 months? No  LIVING ENVIRONMENT: Lives with: lives with their family Lives in: House/apartment Stairs: Yes: External: 8 steps; on right going up Has following equipment at home: None  OCCUPATION: Production designer, theatre/television/film at [employer]  PLOF: Independent  PATIENT GOALS: Be able to walk and hike without pain. Get through a work day without pain  NEXT MD VISIT: Patient unsure of date  OBJECTIVE:  Note: Objective measures were completed at Evaluation unless otherwise noted.  DIAGNOSTIC FINDINGS:  Significant point tenderness of left upper trap, rhomboids, cervical paraspinals. Significant hypomobility with pain centrally C4-T5. Hypomobility with pain centrally L1-4. Point tenderness of lumbar paraspinals and right hamstring  PATIENT SURVEYS:  Modified Oswestry:  MODIFIED OSWESTRY DISABILITY SCALE  Date: 02/06/2024 Score  Pain intensity 3 =  Pain medication provides me with moderate relief from pain.  2. Personal care (washing, dressing, etc.) 1 =  I can take care of myself normally, but it increases my pain.  3. Lifting 2 = Pain prevents me from lifting heavy weights off the floor, activities (eg. sports, dancing). but I can manage if the weights are conveniently positioned (3) Pain prevents me from going out very often. (eg, on a table).  4. Walking 2 =  Pain prevents me from walking more than  mile.  5. Sitting 2 =  Pain prevents me from sitting more than 1 hour.  6. Standing 1 =  I can stand as long as I want but, it increases my pain.  7. Sleeping 0 = Pain does not prevent me from sleeping well.  8. Social Life 2 = Pain prevents me from participating in more energetic activities (eg. sports, dancing).  9. Traveling 0 =  I can travel  anywhere without increased pain.  10. Employment/ Homemaking 2 = I can perform most of my homemaking/job duties, but pain prevents me from performing more physically stressful activities (eg, lifting, vacuuming).  Total 15/50   Interpretation of scores: Score Category Description  0-20% Minimal Disability The patient can cope with most living activities. Usually no treatment is indicated apart from advice on lifting, sitting and exercise  21-40% Moderate Disability The patient experiences more pain and difficulty with sitting, lifting and standing. Travel and social life are more difficult and they may be disabled from work. Personal care, sexual activity and sleeping are not grossly  affected, and the patient can usually be managed by conservative means  41-60% Severe Disability Pain remains the main problem in this group, but activities of daily living are affected. These patients require a detailed investigation  61-80% Crippled Back pain impinges on all aspects of the patient's life. Positive intervention is required  81-100% Bed-bound  These patients are either bed-bound or exaggerating their symptoms  Bluford FORBES Zoe DELENA Karon DELENA, et al. Surgery versus conservative management of stable thoracolumbar fracture: the PRESTO feasibility RCT. [place]): [employer]; 2021 Nov. [facility] Technology Assessment, No. 25.62.) Appendix 3, Oswestry Disability Index category descriptors. Available from: FindJewel[employer].cz  Minimally Clinically Important Difference (MCID) = 12.8% NDI:  NECK DISABILITY INDEX  Date: 02/06/2024 Score  Pain intensity 2 = The pain is moderate at the moment  2. Personal care (washing, dressing, etc.) 1 =  I can look after myself normally but it causes extra pain  3. Lifting 2 = Pain prevents me lifting heavy weights off the floor, but I can manage if they are  conveniently placed, for example on a table  4. Reading 1 = I can read as much  as I want to with slight pain in my neck  5. Headaches 4 = I have severe headaches, which come frequently   6. Concentration 2 = I have a fair degree of difficulty in concentrating when I want to  7. Work 3 =  I cannot do my usual work  8. Driving 1 =  I can drive my car as long as I want with slight pain in my neck  9. Sleeping 0 = I have no trouble sleeping  10. Recreation 3 = I am able to engage in a few of my usual recreation activities because of pain in   my neck  Total 19/50   Minimum Detectable Change (90% confidence): 5 points or 10% points  COGNITION: Overall cognitive status: Within functional limits for tasks assessed     SENSATION: [facility] patient has no complaints of numbness/tingling at this moment   POSTURE: rounded shoulders, forward head, and decreased lumbar lordosis  PALPATION: Significant point tenderness of left upper trap, rhomboids, cervical paraspinals. Significant hypomobility with pain centrally C4-T5. Hypomobility with pain centrally L1-4. Point tenderness of lumbar paraspinals and right hamstring  CERVICAL ROM:   Active ROM A/PROM (deg) eval  Flexion 31 P!  Extension 40 P!  Right lateral flexion 20 P!  Left lateral flexion 14  Right rotation 51  Left rotation 30 P!   (Blank rows = not tested)   LUMBAR ROM:   AROM eval  Flexion 50% P!  Extension 75% stretch  Right lateral flexion 50%  Left lateral flexion 25% P!  Right rotation WNL  Left rotation WNL   (Blank rows = not tested)  LOWER EXTREMITY MMT:    MMT Right eval Left eval  Hip flexion 4+ 4 P!  Hip extension    Hip abduction    Hip adduction    Hip internal rotation    Hip external rotation    Knee flexion 5 5  Knee extension 4+ 4  Ankle dorsiflexion    Ankle plantarflexion    Ankle inversion    Ankle eversion     (Blank rows = not tested)  LUMBAR SPECIAL TESTS:  Straight leg raise test: Positive  FUNCTIONAL TESTS:  Squat to floor. Increased base of support, hip ER  compensation. Heels off ground. Returns to standing with use of UE on thigh  GAIT: Distance walked: 100 Assistive device utilized: None Level of assistance: Complete Independence Comments: Increased lateral trunk sway. Decreased hip extension at pre swing/terminal stance phasd  TREATMENT DATE: 02/06/2024                                                                                                                                 PATIENT EDUCATION:  Education details: Discussed objective findings with patient. Discussed POC including HEP, frequency, and anatomy/physiology of present condition Person educated: Patient Education method: Explanation, Demonstration, and Handouts Education comprehension: verbalized understanding, returned demonstration, and needs further education  HOME EXERCISE PROGRAM: Access Code: [email or web address] URL: [email or web address] Date: 02/06/2024 Prepared by: Alfonse Cords Jareli Highland  Exercises - Bilateral Shoulder Flexion Wall Slide with Towel  - 2 x daily - 7 x weekly - 2 sets - 10-15 reps - Cervical Extension AROM with Strap  - 2 x daily - 7 x weekly - 2 sets - 10 reps - Cervical Retraction at Wall  - 2 x daily - 7 x weekly - 2 sets - 10 reps - Seated Neck Sidebending Stretch  - 2 x daily - 7 x weekly - 2 sets - 15-30 seconds hold  ASSESSMENT:  CLINICAL IMPRESSION: Patient is a 38 y.o. female who was seen today for physical therapy evaluation and treatment for cervical, thoracic, and lumbar pain with bilateral LE symptoms. Area of focus for eval was cervical and left UE due to most intense/limiting pain in these areas. She presents with significant palpable trigger points and soft tissue restrictions of left upper trap, cervical paraspinals, rhomboids, and thoracic paraspinals. Also demonstrates significant hypomobility with CPAs C4-T5. Patient with poor lifting tolerance and difficulty performing work and household chores. Patient requires skilled PT  services to address deficits and return to prior level of function.   OBJECTIVE IMPAIRMENTS: decreased endurance, difficulty walking, decreased ROM, and decreased strength.   ACTIVITY LIMITATIONS: carrying, lifting, bending, standing, squatting, and caring for others  PARTICIPATION LIMITATIONS: meal prep, driving, shopping, community activity, and occupation  PERSONAL FACTORS: Age, Past/current experiences, and Time since onset of injury/illness/exacerbation are also affecting patient's functional outcome.   REHAB POTENTIAL: Good  CLINICAL DECISION MAKING: Evolving/moderate complexity  EVALUATION COMPLEXITY: Moderate   GOALS: Goals reviewed with patient? No  SHORT TERM GOALS: Target date: 03/05/2024    Patient will be independent with HEP to improve carryover of symptoms Baseline: See above Goal status: INITIAL  2.  Patient NDI score of 14/50 to show improved tolerance to activity Baseline: 19/50 Goal status: INITIAL  3.  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  Patient will be able walk greater than 3 hours without pain to be able to perform work and recreational tasks Baseline: Pain after 20 minutes Goal status: INITIAL  3.  Patient will be able to transition from floor to standing without pain to be able to perform child care tasks Baseline: Significant pain with transitions  Goal status: INITIAL PLAN:  PT FREQUENCY: 2x/week  PT DURATION: 10 weeks  PLANNED INTERVENTIONS: 97164- PT Re-evaluation, 97750- Physical Performance Testing, 97110-Therapeutic exercises, 97530- Therapeutic activity, 97112-  Neuromuscular re-education, 97535- Self Care, [id number]- Manual therapy, [phone]- Gait training, [phone]- Aquatic Therapy, Patient/Family education, Balance training, Stair training, Taping, Joint mobilization, Joint manipulation, Spinal manipulation, Spinal mobilization, Cryotherapy, and Moist heat.  PLAN FOR NEXT SESSION: Assess lumbar spine and radicular symptoms   Alfonse Nadine PARAS Paola Flynt, PT, DPT 02/06/2024, 9:27 AM

## 2024-02-09 ENCOUNTER — Ambulatory Visit

## 2024-02-09 DIAGNOSIS — M79605 Pain in left leg: Secondary | ICD-10-CM | POA: Diagnosis not present

## 2024-02-09 DIAGNOSIS — M9901 Segmental and somatic dysfunction of cervical region: Secondary | ICD-10-CM | POA: Diagnosis not present

## 2024-02-09 DIAGNOSIS — M542 Cervicalgia: Secondary | ICD-10-CM | POA: Diagnosis not present

## 2024-02-09 DIAGNOSIS — M79604 Pain in right leg: Secondary | ICD-10-CM | POA: Diagnosis not present

## 2024-02-09 DIAGNOSIS — M9908 Segmental and somatic dysfunction of rib cage: Secondary | ICD-10-CM | POA: Diagnosis not present

## 2024-02-09 DIAGNOSIS — R293 Abnormal posture: Secondary | ICD-10-CM | POA: Diagnosis not present

## 2024-02-09 DIAGNOSIS — M9903 Segmental and somatic dysfunction of lumbar region: Secondary | ICD-10-CM | POA: Diagnosis not present

## 2024-02-09 DIAGNOSIS — M6281 Muscle weakness (generalized): Secondary | ICD-10-CM

## 2024-02-09 DIAGNOSIS — M546 Pain in thoracic spine: Secondary | ICD-10-CM | POA: Diagnosis not present

## 2024-02-09 DIAGNOSIS — M545 Low back pain, unspecified: Secondary | ICD-10-CM | POA: Diagnosis not present

## 2024-02-09 DIAGNOSIS — M9905 Segmental and somatic dysfunction of pelvic region: Secondary | ICD-10-CM | POA: Diagnosis not present

## 2024-02-09 DIAGNOSIS — G8929 Other chronic pain: Secondary | ICD-10-CM | POA: Diagnosis not present

## 2024-02-09 DIAGNOSIS — M7711 Lateral epicondylitis, right elbow: Secondary | ICD-10-CM | POA: Diagnosis not present

## 2024-02-09 DIAGNOSIS — M9902 Segmental and somatic dysfunction of thoracic region: Secondary | ICD-10-CM | POA: Diagnosis not present

## 2024-02-09 NOTE — Therapy (Signed)
 OUTPATIENT PHYSICAL THERAPY TREATMENT NOTE   Patient Name: Candice [name] MRN: [id number] DOB:1985-08-07, 38 y.o., female Today's Date: 02/09/2024  END OF SESSION:  PT End of Session - 02/09/24 1612     Visit Number 2    Date for PT Re-Evaluation 04/09/24    Authorization Type BCBS, MCR    Progress Note Due on Visit 10    PT Start Time 1615    PT Stop Time 1653    PT Time Calculation (min) 38 min    Activity Tolerance Patient tolerated treatment well    Behavior During Therapy WFL for tasks assessed/performed          Past Medical History:  Diagnosis Date   Abnormal MRI, spine 04/06/2020   Alpha thalassemia silent carrier 11/28/2022   No show genetic counseling virtual visit 6/3 - reschedule if desired     FOB declined testing.      Arthralgia 02/18/2022   Back pain 10/13/2011   Complication of anesthesia    epidural with last pregnancy made entire left side numb, had to d/c   Depression    GDM (gestational diabetes mellitus) 02/11/2023   GERD (gastroesophageal reflux disease) 05/21/2020   Gestational hypertension    History of reversal of tubal ligation 03/17/2023   HIV-1 (human immunodeficiency virus I) (HCC)    HSV 06/28/2007   Major depressive disorder, recurrent episode, moderate (HCC) 06/08/2013   Migraines    Multiple sclerosis (HCC)    Right leg numbness 02/07/2020   Vitamin D  deficiency 06/20/2022   Past Surgical History:  Procedure Laterality Date   BREAST SURGERY  2019   reduction   CESAREAN SECTION     CESAREAN SECTION  09/07/2011   Procedure: CESAREAN SECTION;  Surgeon: Vonn VEAR Inch, MD;  Location: [facility];  Service: Gynecology;  Laterality: N/A;  Repeat cesarean section with delivery of baby boy at 68. Apgars 9/9.  Bilateral tubal ligation.   CESAREAN SECTION N/A 04/25/2023   Procedure: CESAREAN SECTION;  Surgeon: Nicholaus Burnard HERO, MD;  Location: [facility];  Service: Obstetrics;  Laterality: N/A;   IUD REMOVAL  2005   in cervix   TUBAL LIGATION      tubal reanastomosis  08/13/2016   Patient Active Problem List   Diagnosis Date Noted   History of depression 01/13/2022   MS (multiple sclerosis) (HCC) 05/21/2020   Healthcare maintenance 02/10/2020   HIV-1 (human immunodeficiency virus I) (HCC) 09/07/2019   Genital herpes 02/16/2013   Tobacco use 03/31/2009    PCP: Kathrine Melena, DO  REFERRING PROVIDER: Morene Mace, DO  REFERRING DIAG: Cervical, thoracic, lumbar pain  Rationale for Evaluation and Treatment: Rehabilitation  THERAPY DIAG:  Cervical pain (neck)  Lumbar pain with radiation down both legs  Muscle weakness (generalized)  Abnormal posture  ONSET DATE: 04/25/2023 after birth of child  SUBJECTIVE:  SUBJECTIVE STATEMENT: Patient reports pain in her RLE and upper back and Lt side of her neck today.  EVAL: Patient reports that her pain tends be in her left cervical and thoracic region. But patient reports that she can have pain in her whole body from her neck down to her lumbar spine. Also reports multiple instances per week when pain radiates from lumbar spine to bilateral LE with right LE being worse down to right foot. Reports radiating symptoms only down to left mid calf. Patient reports pain is worse with increased activity. Patient reports regular course of steroid injections with Dr. Leonce. States most recent injections were in left cervical and thoracic region. Patient reports that her left cervical and thoracic region are worst areas.   PERTINENT HISTORY:  HIV, MS, Depression  PAIN:  Are you having pain? Yes: NPRS scale: 2/10 currently. 10/10 at worst after activity Pain location: At eval addressing left cervical and thoracic Pain description: deep throbbing/ache, numbness/tingling in right foot/leg, occasionally  has sharp/stabbing pains Aggravating factors: lifting, carrying child, standing more than 20 minutes, squatting, floor transitions Relieving factors: ibuprofen , stretches, heat/hot showers  PRECAUTIONS: None  RED FLAGS: None   WEIGHT BEARING RESTRICTIONS: No  FALLS:  Has patient fallen in last 6 months? No  LIVING ENVIRONMENT: Lives with: lives with their family Lives in: House/apartment Stairs: Yes: External: 8 steps; on right going up Has following equipment at home: None  OCCUPATION: Production designer, theatre/television/film at [employer]  PLOF: Independent  PATIENT GOALS: Be able to walk and hike without pain. Get through a work day without pain  NEXT MD VISIT: Patient unsure of date  OBJECTIVE:  Note: Objective measures were completed at Evaluation unless otherwise noted.  DIAGNOSTIC FINDINGS:  Significant point tenderness of left upper trap, rhomboids, cervical paraspinals. Significant hypomobility with pain centrally C4-T5. Hypomobility with pain centrally L1-4. Point tenderness of lumbar paraspinals and right hamstring  PATIENT SURVEYS:  Modified Oswestry:  MODIFIED OSWESTRY DISABILITY SCALE  Date: 02/06/2024 Score  Pain intensity 3 =  Pain medication provides me with moderate relief from pain.  2. Personal care (washing, dressing, etc.) 1 =  I can take care of myself normally, but it increases my pain.  3. Lifting 2 = Pain prevents me from lifting heavy weights off the floor, activities (eg. sports, dancing). but I can manage if the weights are conveniently positioned (3) Pain prevents me from going out very often. (eg, on a table).  4. Walking 2 =  Pain prevents me from walking more than  mile.  5. Sitting 2 =  Pain prevents me from sitting more than 1 hour.  6. Standing 1 =  I can stand as long as I want but, it increases my pain.  7. Sleeping 0 = Pain does not prevent me from sleeping well.  8. Social Life 2 = Pain prevents me from participating in more energetic activities (eg.  sports, dancing).  9. Traveling 0 =  I can travel anywhere without increased pain.  10. Employment/ Homemaking 2 = I can perform most of my homemaking/job duties, but pain prevents me from performing more physically stressful activities (eg, lifting, vacuuming).  Total 15/50   Interpretation of scores: Score Category Description  0-20% Minimal Disability The patient can cope with most living activities. Usually no treatment is indicated apart from advice on lifting, sitting and exercise  21-40% Moderate Disability The patient experiences more pain and difficulty with sitting, lifting and standing. Travel and social life are  more difficult and they may be disabled from work. Personal care, sexual activity and sleeping are not grossly affected, and the patient can usually be managed by conservative means  41-60% Severe Disability Pain remains the main problem in this group, but activities of daily living are affected. These patients require a detailed investigation  61-80% Crippled Back pain impinges on all aspects of the patient's life. Positive intervention is required  81-100% Bed-bound  These patients are either bed-bound or exaggerating their symptoms  Bluford FORBES Zoe DELENA Karon DELENA, et al. Surgery versus conservative management of stable thoracolumbar fracture: the PRESTO feasibility RCT. [place] ([place]): [employer]; 2021 Nov. [facility] Technology Assessment, No. 25.62.) Appendix 3, Oswestry Disability Index category descriptors. Available from: FindJewel[employer].cz  Minimally Clinically Important Difference (MCID) = 12.8% NDI:  NECK DISABILITY INDEX  Date: 02/06/2024 Score  Pain intensity 2 = The pain is moderate at the moment  2. Personal care (washing, dressing, etc.) 1 =  I can look after myself normally but it causes extra pain  3. Lifting 2 = Pain prevents me lifting heavy weights off the floor, but I can manage if they are  conveniently placed, for  example on a table  4. Reading 1 = I can read as much as I want to with slight pain in my neck  5. Headaches 4 = I have severe headaches, which come frequently   6. Concentration 2 = I have a fair degree of difficulty in concentrating when I want to  7. Work 3 =  I cannot do my usual work  8. Driving 1 =  I can drive my car as long as I want with slight pain in my neck  9. Sleeping 0 = I have no trouble sleeping  10. Recreation 3 = I am able to engage in a few of my usual recreation activities because of pain in   my neck  Total 19/50   Minimum Detectable Change (90% confidence): 5 points or 10% points  COGNITION: Overall cognitive status: Within functional limits for tasks assessed     SENSATION: [facility] patient has no complaints of numbness/tingling at this moment   POSTURE: rounded shoulders, forward head, and decreased lumbar lordosis  PALPATION: Significant point tenderness of left upper trap, rhomboids, cervical paraspinals. Significant hypomobility with pain centrally C4-T5. Hypomobility with pain centrally L1-4. Point tenderness of lumbar paraspinals and right hamstring  CERVICAL ROM:   Active ROM A/PROM (deg) eval  Flexion 31 P!  Extension 40 P!  Right lateral flexion 20 P!  Left lateral flexion 14  Right rotation 51  Left rotation 30 P!   (Blank rows = not tested)   LUMBAR ROM:   AROM eval  Flexion 50% P!  Extension 75% stretch  Right lateral flexion 50%  Left lateral flexion 25% P!  Right rotation WNL  Left rotation WNL   (Blank rows = not tested)  LOWER EXTREMITY MMT:    MMT Right eval Left eval  Hip flexion 4+ 4 P!  Hip extension    Hip abduction    Hip adduction    Hip internal rotation    Hip external rotation    Knee flexion 5 5  Knee extension 4+ 4  Ankle dorsiflexion    Ankle plantarflexion    Ankle inversion    Ankle eversion     (Blank rows = not tested)  LUMBAR SPECIAL TESTS:  Straight leg raise test: Positive  FUNCTIONAL TESTS:   Squat to floor. Increased base  of support, hip ER compensation. Heels off ground. Returns to standing with use of UE on thigh  GAIT: Distance walked: 100 Assistive device utilized: None Level of assistance: Complete Independence Comments: Increased lateral trunk sway. Decreased hip extension at pre swing/terminal stance phasd  TREATMENT DATE:  [facility] Adult PT Treatment:                                                DATE: 02/09/24 Therapeutic Exercise: Nustep level 3 x 8 mins while gathering subjective and planning session with patient (LE only, painful on LUE) Neuromuscular re-ed: Palloff press GTB 2x10 BIL Rows GTB 2x10 - cues for posture Shoulder extension GTB 2x10 - cues for posture Seated BIL ER with scap retraction RTB 2x10 - cues for chin tuck Seated horizontal abduction RTB 2x10 - cues for chin tuck Bridges 2x10 LTR x2' BIL Sidelying open books x10 BIL - cues for smaller ROM to decrease pain   02/06/2024                                                                                                                                PATIENT EDUCATION:  Education details: Discussed objective findings with patient. Discussed POC including HEP, frequency, and anatomy/physiology of present condition Person educated: Patient Education method: Explanation, Demonstration, and Handouts Education comprehension: verbalized understanding, returned demonstration, and needs further education  HOME EXERCISE PROGRAM: Access Code: [email or web address] URL: [email or web address] Date: 02/06/2024 Prepared by: Alfonse Cords Diy  Exercises - Bilateral Shoulder Flexion Wall Slide with Towel  - 2 x daily - 7 x weekly - 2 sets - 10-15 reps - Cervical Extension AROM with Strap  - 2 x daily - 7 x weekly - 2 sets - 10 reps - Cervical Retraction at Wall  - 2 x daily - 7 x weekly - 2 sets - 10 reps - Seated Neck Sidebending Stretch  - 2 x daily - 7 x weekly - 2 sets - 15-30 seconds  hold  ASSESSMENT:  CLINICAL IMPRESSION: Patient presents to first follow up PT session reporting pain in the Lt side of her neck and Rt side of her lower back and RLE. Session today focused on periscapular strengthening with focus on postural cues to decrease rounding of shoulders. Also incorporated proximal hip strengthening to decrease lower back tension. Patient was able to tolerate all prescribed exercises with no adverse effects. Patient continues to benefit from skilled PT services and should be progressed as able to improve functional independence.   EVAL: Patient is a 38 y.o. female who was seen today for physical therapy evaluation and treatment for cervical, thoracic, and lumbar pain with bilateral LE symptoms. Area of focus for eval was cervical and left UE due to most intense/limiting pain in these areas. She presents with significant palpable trigger points and  soft tissue restrictions of left upper trap, cervical paraspinals, rhomboids, and thoracic paraspinals. Also demonstrates significant hypomobility with CPAs C4-T5. Patient with poor lifting tolerance and difficulty performing work and household chores. Patient requires skilled PT services to address deficits and return to prior level of function.   OBJECTIVE IMPAIRMENTS: decreased endurance, difficulty walking, decreased ROM, and decreased strength.   ACTIVITY LIMITATIONS: carrying, lifting, bending, standing, squatting, and caring for others  PARTICIPATION LIMITATIONS: meal prep, driving, shopping, community activity, and occupation  PERSONAL FACTORS: Age, Past/current experiences, and Time since onset of injury/illness/exacerbation are also affecting patient's functional outcome.   REHAB POTENTIAL: Good  CLINICAL DECISION MAKING: Evolving/moderate complexity  EVALUATION COMPLEXITY: Moderate   GOALS: Goals reviewed with patient? No  SHORT TERM GOALS: Target date: 03/05/2024   Patient will be independent with HEP to  improve carryover of symptoms Baseline: See above Goal status: INITIAL  2.  Patient NDI score of 14/50 to show improved tolerance to activity Baseline: 19/50 Goal status: INITIAL  3.  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  Patient will be able walk greater than 3 hours without pain to be able to perform work and recreational tasks Baseline: Pain after 20 minutes Goal status: INITIAL  3.  Patient will be able to transition from floor to standing without pain to be able to perform child care tasks Baseline: Significant pain with transitions  Goal status: INITIAL PLAN:  PT FREQUENCY: 2x/week  PT DURATION: 10 weeks  PLANNED INTERVENTIONS: 97164- PT Re-evaluation, 97750- Physical Performance Testing, 97110-Therapeutic exercises, 97530- Therapeutic activity, 97112- Neuromuscular re-education, 97535- Self Care, [id number]- Manual therapy, [phone]- Gait training, [phone]- Aquatic Therapy, Patient/Family education, Balance training, Stair training, Taping, Joint mobilization, Joint manipulation, Spinal manipulation, Spinal mobilization, Cryotherapy, and Moist heat.  PLAN FOR NEXT SESSION: Assess lumbar spine and radicular symptoms   Corean Pouch, PTA 02/09/2024, 4:53 PM

## 2024-02-10 ENCOUNTER — Ambulatory Visit

## 2024-02-10 DIAGNOSIS — M9908 Segmental and somatic dysfunction of rib cage: Secondary | ICD-10-CM | POA: Diagnosis not present

## 2024-02-10 DIAGNOSIS — M542 Cervicalgia: Secondary | ICD-10-CM

## 2024-02-10 DIAGNOSIS — M9905 Segmental and somatic dysfunction of pelvic region: Secondary | ICD-10-CM | POA: Diagnosis not present

## 2024-02-10 DIAGNOSIS — G8929 Other chronic pain: Secondary | ICD-10-CM | POA: Diagnosis not present

## 2024-02-10 DIAGNOSIS — M546 Pain in thoracic spine: Secondary | ICD-10-CM | POA: Diagnosis not present

## 2024-02-10 DIAGNOSIS — M6281 Muscle weakness (generalized): Secondary | ICD-10-CM | POA: Diagnosis not present

## 2024-02-10 DIAGNOSIS — M79604 Pain in right leg: Secondary | ICD-10-CM

## 2024-02-10 DIAGNOSIS — M9902 Segmental and somatic dysfunction of thoracic region: Secondary | ICD-10-CM | POA: Diagnosis not present

## 2024-02-10 DIAGNOSIS — M7711 Lateral epicondylitis, right elbow: Secondary | ICD-10-CM | POA: Diagnosis not present

## 2024-02-10 DIAGNOSIS — M9903 Segmental and somatic dysfunction of lumbar region: Secondary | ICD-10-CM | POA: Diagnosis not present

## 2024-02-10 DIAGNOSIS — M9901 Segmental and somatic dysfunction of cervical region: Secondary | ICD-10-CM | POA: Diagnosis not present

## 2024-02-10 DIAGNOSIS — R293 Abnormal posture: Secondary | ICD-10-CM | POA: Diagnosis not present

## 2024-02-10 DIAGNOSIS — M545 Low back pain, unspecified: Secondary | ICD-10-CM | POA: Diagnosis not present

## 2024-02-10 DIAGNOSIS — M79605 Pain in left leg: Secondary | ICD-10-CM | POA: Diagnosis not present

## 2024-02-10 NOTE — Therapy (Signed)
 OUTPATIENT PHYSICAL THERAPY TREATMENT NOTE   Patient Name: Candice [name] MRN: [id number] DOB:01/04/1986, 38 y.o., female Today's Date: 02/13/2024  END OF SESSION:     Past Medical History:  Diagnosis Date   Abnormal MRI, spine 04/06/2020   Alpha thalassemia silent carrier 11/28/2022   No show genetic counseling virtual visit 6/3 - reschedule if desired     FOB declined testing.      Arthralgia 02/18/2022   Back pain 10/13/2011   Complication of anesthesia    epidural with last pregnancy made entire left side numb, had to d/c   Depression    GDM (gestational diabetes mellitus) 02/11/2023   GERD (gastroesophageal reflux disease) 05/21/2020   Gestational hypertension    History of reversal of tubal ligation 03/17/2023   HIV-1 (human immunodeficiency virus I) (HCC)    HSV 06/28/2007   Major depressive disorder, recurrent episode, moderate (HCC) 06/08/2013   Migraines    Multiple sclerosis (HCC)    Right leg numbness 02/07/2020   Vitamin D  deficiency 06/20/2022   Past Surgical History:  Procedure Laterality Date   BREAST SURGERY  2019   reduction   CESAREAN SECTION     CESAREAN SECTION  09/07/2011   Procedure: CESAREAN SECTION;  Surgeon: Vonn VEAR Inch, MD;  Location: [facility];  Service: Gynecology;  Laterality: N/A;  Repeat cesarean section with delivery of baby boy at 52. Apgars 9/9.  Bilateral tubal ligation.   CESAREAN SECTION N/A 04/25/2023   Procedure: CESAREAN SECTION;  Surgeon: Nicholaus Burnard HERO, MD;  Location: [facility];  Service: Obstetrics;  Laterality: N/A;   IUD REMOVAL  2005   in cervix   TUBAL LIGATION     tubal reanastomosis  08/13/2016   Patient Active Problem List   Diagnosis Date Noted   History of depression 01/13/2022   MS (multiple sclerosis) (HCC) 05/21/2020   Healthcare maintenance 02/10/2020   HIV-1 (human immunodeficiency virus I) (HCC) 09/07/2019   Genital herpes 02/16/2013   Tobacco use 03/31/2009    PCP: Kathrine Melena, DO  REFERRING  PROVIDER: Morene Mace, DO  REFERRING DIAG: Cervical, thoracic, lumbar pain  Rationale for Evaluation and Treatment: Rehabilitation  THERAPY DIAG:  Cervical pain (neck)  Lumbar pain with radiation down both legs  Muscle weakness (generalized)  ONSET DATE: 04/25/2023 after birth of child  SUBJECTIVE:                                                                                                                                                                                           SUBJECTIVE STATEMENT: Patient reports some pain in her Left shoulder.  EVAL: Patient reports  that her pain tends be in her left cervical and thoracic region. But patient reports that she can have pain in her whole body from her neck down to her lumbar spine. Also reports multiple instances per week when pain radiates from lumbar spine to bilateral LE with right LE being worse down to right foot. Reports radiating symptoms only down to left mid calf. Patient reports pain is worse with increased activity. Patient reports regular course of steroid injections with Dr. Leonce. States most recent injections were in left cervical and thoracic region. Patient reports that her left cervical and thoracic region are worst areas.   PERTINENT HISTORY:  HIV, MS, Depression  PAIN:  Are you having pain? Yes: NPRS scale: 2/10 currently. 10/10 at worst after activity Pain location: At eval addressing left cervical and thoracic Pain description: deep throbbing/ache, numbness/tingling in right foot/leg, occasionally has sharp/stabbing pains Aggravating factors: lifting, carrying child, standing more than 20 minutes, squatting, floor transitions Relieving factors: ibuprofen , stretches, heat/hot showers  PRECAUTIONS: None  RED FLAGS: None   WEIGHT BEARING RESTRICTIONS: No  FALLS:  Has patient fallen in last 6 months? No  LIVING ENVIRONMENT: Lives with: lives with their family Lives in: House/apartment Stairs:  Yes: External: 8 steps; on right going up Has following equipment at home: None  OCCUPATION: Production designer, theatre/television/film at [employer]  PLOF: Independent  PATIENT GOALS: Be able to walk and hike without pain. Get through a work day without pain  NEXT MD VISIT: Patient unsure of date  OBJECTIVE:  Note: Objective measures were completed at Evaluation unless otherwise noted.  DIAGNOSTIC FINDINGS:  Significant point tenderness of left upper trap, rhomboids, cervical paraspinals. Significant hypomobility with pain centrally C4-T5. Hypomobility with pain centrally L1-4. Point tenderness of lumbar paraspinals and right hamstring  PATIENT SURVEYS:  Modified Oswestry:  MODIFIED OSWESTRY DISABILITY SCALE  Date: 02/06/2024 Score  Pain intensity 3 =  Pain medication provides me with moderate relief from pain.  2. Personal care (washing, dressing, etc.) 1 =  I can take care of myself normally, but it increases my pain.  3. Lifting 2 = Pain prevents me from lifting heavy weights off the floor, activities (eg. sports, dancing). but I can manage if the weights are conveniently positioned (3) Pain prevents me from going out very often. (eg, on a table).  4. Walking 2 =  Pain prevents me from walking more than  mile.  5. Sitting 2 =  Pain prevents me from sitting more than 1 hour.  6. Standing 1 =  I can stand as long as I want but, it increases my pain.  7. Sleeping 0 = Pain does not prevent me from sleeping well.  8. Social Life 2 = Pain prevents me from participating in more energetic activities (eg. sports, dancing).  9. Traveling 0 =  I can travel anywhere without increased pain.  10. Employment/ Homemaking 2 = I can perform most of my homemaking/job duties, but pain prevents me from performing more physically stressful activities (eg, lifting, vacuuming).  Total 15/50   Interpretation of scores: Score Category Description  0-20% Minimal Disability The patient can cope with most living activities.  Usually no treatment is indicated apart from advice on lifting, sitting and exercise  21-40% Moderate Disability The patient experiences more pain and difficulty with sitting, lifting and standing. Travel and social life are more difficult and they may be disabled from work. Personal care, sexual activity and sleeping are not grossly affected, and the patient  can usually be managed by conservative means  41-60% Severe Disability Pain remains the main problem in this group, but activities of daily living are affected. These patients require a detailed investigation  61-80% Crippled Back pain impinges on all aspects of the patient's life. Positive intervention is required  81-100% Bed-bound  These patients are either bed-bound or exaggerating their symptoms  Bluford FORBES Zoe DELENA Karon DELENA, et al. Surgery versus conservative management of stable thoracolumbar fracture: the PRESTO feasibility RCT. [place] ([place]): [employer]; 2021 Nov. [facility] Technology Assessment, No. 25.62.) Appendix 3, Oswestry Disability Index category descriptors. Available from: FindJewelers.cz  Minimally Clinically Important Difference (MCID) = 12.8% NDI:  NECK DISABILITY INDEX  Date: 02/06/2024 Score  Pain intensity 2 = The pain is moderate at the moment  2. Personal care (washing, dressing, etc.) 1 =  I can look after myself normally but it causes extra pain  3. Lifting 2 = Pain prevents me lifting heavy weights off the floor, but I can manage if they are  conveniently placed, for example on a table  4. Reading 1 = I can read as much as I want to with slight pain in my neck  5. Headaches 4 = I have severe headaches, which come frequently   6. Concentration 2 = I have a fair degree of difficulty in concentrating when I want to  7. Work 3 =  I cannot do my usual work  8. Driving 1 =  I can drive my car as long as I want with slight pain in my neck  9. Sleeping 0 = I have no trouble  sleeping  10. Recreation 3 = I am able to engage in a few of my usual recreation activities because of pain in   my neck  Total 19/50   Minimum Detectable Change (90% confidence): 5 points or 10% points  COGNITION: Overall cognitive status: Within functional limits for tasks assessed     SENSATION: [facility] patient has no complaints of numbness/tingling at this moment   POSTURE: rounded shoulders, forward head, and decreased lumbar lordosis  PALPATION: Significant point tenderness of left upper trap, rhomboids, cervical paraspinals. Significant hypomobility with pain centrally C4-T5. Hypomobility with pain centrally L1-4. Point tenderness of lumbar paraspinals and right hamstring  CERVICAL ROM:   Active ROM A/PROM (deg) eval  Flexion 31 P!  Extension 40 P!  Right lateral flexion 20 P!  Left lateral flexion 14  Right rotation 51  Left rotation 30 P!   (Blank rows = not tested)   LUMBAR ROM:   AROM eval  Flexion 50% P!  Extension 75% stretch  Right lateral flexion 50%  Left lateral flexion 25% P!  Right rotation WNL  Left rotation WNL   (Blank rows = not tested)  LOWER EXTREMITY MMT:    MMT Right eval Left eval  Hip flexion 4+ 4 P!  Hip extension    Hip abduction    Hip adduction    Hip internal rotation    Hip external rotation    Knee flexion 5 5  Knee extension 4+ 4  Ankle dorsiflexion    Ankle plantarflexion    Ankle inversion    Ankle eversion     (Blank rows = not tested)  LUMBAR SPECIAL TESTS:  Straight leg raise test: Positive  FUNCTIONAL TESTS:  Squat to floor. Increased base of support, hip ER compensation. Heels off ground. Returns to standing with use of UE on thigh  GAIT: Distance walked: 100 Assistive  device utilized: None Level of assistance: Complete Independence Comments: Increased lateral trunk sway. Decreased hip extension at pre swing/terminal stance phasd  TREATMENT DATE:  [facility] Adult PT Treatment:                                                 DATE: 02/10/24 Therapeutic Exercise: Nustep level 3 x 10 mins while gathering subjective and planning session with patient and working on endurance (LE only, painful on LUE) QL stretch 2x30 BIL Neuromuscular re-ed: Palloff press GTB x10 BIL PPT 5 hold 2x10  Rows GTB 2x10 - cues for posture PPT with alternating marching 2x30 LTR x2' BIL Lat pull down 25# 2x10 Therapeutic Activity: Hip hinge education and early deadlifting technique/form with hand slide down LE Manual Therapy: LAD on RLE   [facility] Adult PT Treatment:                                                DATE: 02/09/24 Therapeutic Exercise: Nustep level 3 x 8 mins while gathering subjective and planning session with patient (LE only, painful on LUE) Neuromuscular re-ed: Palloff press GTB 2x10 BIL Rows GTB 2x10 - cues for posture Shoulder extension GTB 2x10 - cues for posture Seated BIL ER with scap retraction RTB 2x10 - cues for chin tuck Seated horizontal abduction RTB 2x10 - cues for chin tuck Bridges 2x10 LTR x2' BIL Sidelying open books x10 BIL - cues for smaller ROM to decrease pain   02/06/2024                                                                                                                                PATIENT EDUCATION:  Education details: Discussed objective findings with patient. Discussed POC including HEP, frequency, and anatomy/physiology of present condition Person educated: Patient Education method: Explanation, Demonstration, and Handouts Education comprehension: verbalized understanding, returned demonstration, and needs further education  HOME EXERCISE PROGRAM: Access Code: [email or web address] URL: [email or web address] Date: 02/06/2024 Prepared by: Alfonse Cords Diy  Exercises - Bilateral Shoulder Flexion Wall Slide with Towel  - 2 x daily - 7 x weekly - 2 sets - 10-15 reps - Cervical Extension AROM with Strap  - 2 x daily - 7 x weekly - 2 sets - 10 reps -  Cervical Retraction at Wall  - 2 x daily - 7 x weekly - 2 sets - 10 reps - Seated Neck Sidebending Stretch  - 2 x daily - 7 x weekly - 2 sets - 15-30 seconds hold  ASSESSMENT:  CLINICAL IMPRESSION: Patient presents to PT reporting continued Lt shoulder pain, not particularly sore from previous session. Session today continued to focus on periscapular strengthening as  well as introduction of lifting techniques. Patient was able to tolerate all prescribed exercises with no adverse effects. Patient continues to benefit from skilled PT services and should be progressed as able to improve functional independence.   EVAL: Patient is a 38 y.o. female who was seen today for physical therapy evaluation and treatment for cervical, thoracic, and lumbar pain with bilateral LE symptoms. Area of focus for eval was cervical and left UE due to most intense/limiting pain in these areas. She presents with significant palpable trigger points and soft tissue restrictions of left upper trap, cervical paraspinals, rhomboids, and thoracic paraspinals. Also demonstrates significant hypomobility with CPAs C4-T5. Patient with poor lifting tolerance and difficulty performing work and household chores. Patient requires skilled PT services to address deficits and return to prior level of function.   OBJECTIVE IMPAIRMENTS: decreased endurance, difficulty walking, decreased ROM, and decreased strength.   ACTIVITY LIMITATIONS: carrying, lifting, bending, standing, squatting, and caring for others  PARTICIPATION LIMITATIONS: meal prep, driving, shopping, community activity, and occupation  PERSONAL FACTORS: Age, Past/current experiences, and Time since onset of injury/illness/exacerbation are also affecting patient's functional outcome.   REHAB POTENTIAL: Good  CLINICAL DECISION MAKING: Evolving/moderate complexity  EVALUATION COMPLEXITY: Moderate   GOALS: Goals reviewed with patient? No  SHORT TERM GOALS: Target date:  03/05/2024   Patient will be independent with HEP to improve carryover of symptoms Baseline: See above Goal status: INITIAL  2.  Patient NDI score of 14/50 to show improved tolerance to activity Baseline: 19/50 Goal status: INITIAL  3.  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  Patient will be able walk greater than 3 hours without pain to be able to perform work and recreational tasks Baseline: Pain after 20 minutes Goal status: INITIAL  3.  Patient will be able to transition from floor to standing without pain to be able to perform child care tasks Baseline: Significant pain with transitions  Goal status: INITIAL PLAN:  PT FREQUENCY: 2x/week  PT DURATION: 10 weeks  PLANNED INTERVENTIONS: 97164- PT Re-evaluation, 97750- Physical Performance Testing, 97110-Therapeutic exercises, 97530- Therapeutic activity, 97112- Neuromuscular re-education, 97535- Self Care, [id number]- Manual therapy, [phone]- Gait training, [phone]- Aquatic Therapy, Patient/Family education, Balance training, Stair training, Taping, Joint mobilization, Joint manipulation, Spinal manipulation, Spinal mobilization, Cryotherapy, and Moist heat.  PLAN FOR NEXT SESSION: Assess lumbar spine and radicular symptoms   Corean Pouch, PTA 02/13/2024, 7:46 AM

## 2024-02-17 ENCOUNTER — Telehealth: Payer: Self-pay | Admitting: Obstetrics & Gynecology

## 2024-02-17 ENCOUNTER — Ambulatory Visit

## 2024-02-17 NOTE — Progress Notes (Unsigned)
 [name] [address] [place] [address] Phone: [phone]   Assessment and Plan:     1. Neck pain (Primary) 2. Chronic bilateral low back pain without sciatica 3. Somatic dysfunction of cervical region 4. Somatic dysfunction of thoracic region 5. Somatic dysfunction of lumbar region 6. Somatic dysfunction of rib region 7. Somatic dysfunction of pelvic region -Chronic with exacerbation, subsequent visit - Overall patient has felt improvements in multiple areas of musculoskeletal pain with trigger point injections, starting physical therapy.  In general, patient has found trigger point injections to be more effective than OMT - Patient elected for repeat trigger point injections today.  Tolerated well per note below - Use meloxicam  15 mg daily as needed for pain.  Recommend limiting chronic NSAIDs to 1-2 doses per week to prevent long-term side effects. -Continue physical therapy  Trigger Point Injection: After informed consent was obtained, skin cleaned with alcohol  prep.  A total of 5 trigger points identified along left trapezius and periscapular musculature.  Injections given over area of pain for total injection of 5 ml lidocaine  1% w/o epi.  Patient had relief after the injection without side effects.  Pt given signs of infection to watch for.   Pertinent previous records reviewed include none  Follow Up: 2 weeks for reevaluation.  Could consider repeat OMT versus lidocaine  trigger point injections   Subjective:   I, Mickey Esguerra, am serving as a Neurosurgeon for Doctor Morene Mace  Chief Complaint: thoracic back pain    HPI:    02/17/2022 Patient is a 38 year old female complaining of thoracic back pain. Patient states  Beginning around 2018, she began having intermittent episodes of numbness, weakness and dizziness.  In May or June of that year, she began having a recurrence of symptoms, including right flank pain and  numbness radiating down her right leg with some associated right leg weakness.  She felt off balance.  She saw neurology at that time.  MRI of thoracic spine revealed subtle enhancing hyperintense focus at the T8-T9 region.would like to discuss OMT    03/10/2022 Patient states that she is alright, left side back is still tight and shoulders    04/07/2022 Patient states that she is okay left side is still crazy even with PT   05/05/2022 Patient states she is good needs a tune up   06/02/2022 Patient states that she is pretty good    06/30/2022 Patient states left side is a problem , PT told her to re check rotator cuff   07/22/2022 Patient states she is pretty good, CSI was a life saver    09/09/2022 Patient states that she has been having intermittent flares    10/07/2022 Patient states that she would still like to get OMT while pregnant    11/04/2022 Patient states left side is  jacked up again    11/27/2022 Patient states shoulder neck and regular adjustment    01/02/2023 Patient states back is flared , but other than that pretty good    01/30/2023 Patient states left side is starting to act up and spasm    02/09/2023 Patient states that her shoulder pain has started coming back in the last 2-3 weeks and it is burning.   02/27/2023 Patient states that she is decent. Left sided neck tightness    09/16/2023 Patient states wants to discuss CSI options. And ready for an adjustment    10/22/2023 Patient states shots helped. Low back  and neck are tight    11/19/2023 Patient states left side low back is flared    12/24/2023 Patient states ok but hurting a little bit. The whole back and the right arm. Right arm has been swollen, elbow has been hurting, and she has been wearing a brace. Some swelling in the right hand as well. No injury to the arm. 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    01/21/2024 Patient states she is feeling some  better   02/18/2024 Patient states she is decent. Left side is flared    Relevant Historical Information: Multiple sclerosis  Additional pertinent review of systems negative.  Current Outpatient Medications  Medication Sig Dispense Refill   acetaminophen  (TYLENOL ) 500 MG tablet Take 500 mg by mouth every 6 (six) hours as needed for mild pain.     cabotegravir  & rilpivirine  ER (CABENUVA ) 600 & 900 MG/3ML injection Inject 1 kit into the muscle every 2 (two) months. 6 mL 5   Cholecalciferol  (VITAMIN D ) 125 MCG (5000 UT) CAPS Take 5,000 Units by mouth daily.     meloxicam  (MOBIC ) 15 MG tablet Take 1 tablet (15 mg total) by mouth as needed for pain. 30 tablet 0   sertraline  (ZOLOFT ) 25 MG tablet Take 1 tablet (25 mg total) by mouth daily. 30 tablet 2   No current facility-administered medications for this visit.      Objective:     Vitals:   02/18/24 1121  Pulse: 84  SpO2: 96%  Weight: 216 lb (98 kg)  Height: 5' 2 (1.575 m)      Body mass index is 39.51 kg/m.    Physical Exam:     Gen: Appears well, nad, nontoxic and pleasant Psych: Alert and oriented, appropriate mood and affect Neuro: sensation intact, strength is 5/5 in upper and lower extremities, muscle tone wnl Skin: no susupicious lesions or rashes   Back - Normal skin, Spine with normal alignment and no deformity.   No tenderness to vertebral process palpation.   Paraspinous muscles are not tender and without spasm TTP bilateral trapezius, left thoracic and lumbar paraspinal muscles, and left-sided inferior scapular musculature NTTP gluteal musculature  Gait normal   Electronically signed by:  Odis Mace [name] 11:34 AM 02/18/24

## 2024-02-17 NOTE — Telephone Encounter (Signed)
 Patient called to say she has ben going through something's. She thinks it could be menopause. She had been bleeding for a month, and gets weak. She has been scheduled for October, and wanted to know if there was something she needed to do. Maybe get some lab work done to make sure her hemoglobin isn't low.

## 2024-02-17 NOTE — Therapy (Unsigned)
 OUTPATIENT PHYSICAL THERAPY TREATMENT NOTE   Patient Name: Candice [name] MRN: [id number] DOB:04/15/1986, 38 y.o., female Today's Date: 02/18/2024  END OF SESSION:  PT End of Session - 02/18/24 1748     Visit Number 4    Date for PT Re-Evaluation 04/09/24    Authorization Type BCBS, MCR    Progress Note Due on Visit 10    PT Start Time 1745    PT Stop Time 1825    PT Time Calculation (min) 40 min    Activity Tolerance Patient tolerated treatment well    Behavior During Therapy [facility] for tasks assessed/performed            Past Medical History:  Diagnosis Date   Abnormal MRI, spine 04/06/2020   Alpha thalassemia silent carrier 11/28/2022   No show genetic counseling virtual visit 6/3 - reschedule if desired     FOB declined testing.      Arthralgia 02/18/2022   Back pain 10/13/2011   Complication of anesthesia    epidural with last pregnancy made entire left side numb, had to d/c   Depression    GDM (gestational diabetes mellitus) 02/11/2023   GERD (gastroesophageal reflux disease) 05/21/2020   Gestational hypertension    History of reversal of tubal ligation 03/17/2023   HIV-1 (human immunodeficiency virus I) (HCC)    HSV 06/28/2007   Major depressive disorder, recurrent episode, moderate (HCC) 06/08/2013   Migraines    Multiple sclerosis (HCC)    Right leg numbness 02/07/2020   Vitamin D  deficiency 06/20/2022   Past Surgical History:  Procedure Laterality Date   BREAST SURGERY  2019   reduction   CESAREAN SECTION     CESAREAN SECTION  09/07/2011   Procedure: CESAREAN SECTION;  Surgeon: Vonn VEAR Inch, MD;  Location: [facility] ORS;  Service: Gynecology;  Laterality: N/A;  Repeat cesarean section with delivery of baby boy at 4. Apgars 9/9.  Bilateral tubal ligation.   CESAREAN SECTION N/A 04/25/2023   Procedure: CESAREAN SECTION;  Surgeon: Nicholaus Burnard HERO, MD;  Location: [facility] ORS;  Service: Obstetrics;  Laterality: N/A;   IUD REMOVAL  2005   in cervix   TUBAL  LIGATION     tubal reanastomosis  08/13/2016   Patient Active Problem List   Diagnosis Date Noted   History of depression 01/13/2022   MS (multiple sclerosis) (HCC) 05/21/2020   Healthcare maintenance 02/10/2020   HIV-1 (human immunodeficiency virus I) (HCC) 09/07/2019   Genital herpes 02/16/2013   Tobacco use 03/31/2009    PCP: Kathrine Melena, DO  REFERRING PROVIDER: Morene Mace, DO  REFERRING DIAG: Cervical, thoracic, lumbar pain  Rationale for Evaluation and Treatment: Rehabilitation  THERAPY DIAG:  Cervical pain (neck)  Lumbar pain with radiation down both legs  Muscle weakness (generalized)  ONSET DATE: 04/25/2023 after birth of child  SUBJECTIVE:  SUBJECTIVE STATEMENT:  Mild soreness reported today in L shoulder region.  Underwent multiple TP injections today by Dr. Leonce  EVAL: Patient reports that her pain tends be in her left cervical and thoracic region. But patient reports that she can have pain in her whole body from her neck down to her lumbar spine. Also reports multiple instances per week when pain radiates from lumbar spine to bilateral LE with right LE being worse down to right foot. Reports radiating symptoms only down to left mid calf. Patient reports pain is worse with increased activity. Patient reports regular course of steroid injections with Dr. Leonce. States most recent injections were in left cervical and thoracic region. Patient reports that her left cervical and thoracic region are worst areas.   PERTINENT HISTORY:  HIV, MS, Depression  PAIN:  Are you having pain? Yes: NPRS scale: 2/10 currently. 10/10 at worst after activity Pain location: At eval addressing left cervical and thoracic Pain description: deep throbbing/ache, numbness/tingling in right  foot/leg, occasionally has sharp/stabbing pains Aggravating factors: lifting, carrying child, standing more than 20 minutes, squatting, floor transitions Relieving factors: ibuprofen , stretches, heat/hot showers  PRECAUTIONS: None  RED FLAGS: None   WEIGHT BEARING RESTRICTIONS: No  FALLS:  Has patient fallen in last 6 months? No  LIVING ENVIRONMENT: Lives with: lives with their family Lives in: House/apartment Stairs: Yes: External: 8 steps; on right going up Has following equipment at home: None  OCCUPATION: Production designer, theatre/television/film at [employer]  PLOF: Independent  PATIENT GOALS: Be able to walk and hike without pain. Get through a work day without pain  NEXT MD VISIT: Patient unsure of date  OBJECTIVE:  Note: Objective measures were completed at Evaluation unless otherwise noted.  DIAGNOSTIC FINDINGS:  Significant point tenderness of left upper trap, rhomboids, cervical paraspinals. Significant hypomobility with pain centrally C4-T5. Hypomobility with pain centrally L1-4. Point tenderness of lumbar paraspinals and right hamstring  PATIENT SURVEYS:  Modified Oswestry:  MODIFIED OSWESTRY DISABILITY SCALE  Date: 02/06/2024 Score  Pain intensity 3 =  Pain medication provides me with moderate relief from pain.  2. Personal care (washing, dressing, etc.) 1 =  I can take care of myself normally, but it increases my pain.  3. Lifting 2 = Pain prevents me from lifting heavy weights off the floor, activities (eg. sports, dancing). but I can manage if the weights are conveniently positioned (3) Pain prevents me from going out very often. (eg, on a table).  4. Walking 2 =  Pain prevents me from walking more than  mile.  5. Sitting 2 =  Pain prevents me from sitting more than 1 hour.  6. Standing 1 =  I can stand as long as I want but, it increases my pain.  7. Sleeping 0 = Pain does not prevent me from sleeping well.  8. Social Life 2 = Pain prevents me from participating in more  energetic activities (eg. sports, dancing).  9. Traveling 0 =  I can travel anywhere without increased pain.  10. Employment/ Homemaking 2 = I can perform most of my homemaking/job duties, but pain prevents me from performing more physically stressful activities (eg, lifting, vacuuming).  Total 15/50   Interpretation of scores: Score Category Description  0-20% Minimal Disability The patient can cope with most living activities. Usually no treatment is indicated apart from advice on lifting, sitting and exercise  21-40% Moderate Disability The patient experiences more pain and difficulty with sitting, lifting and standing. Travel and social  life are more difficult and they may be disabled from work. Personal care, sexual activity and sleeping are not grossly affected, and the patient can usually be managed by conservative means  41-60% Severe Disability Pain remains the main problem in this group, but activities of daily living are affected. These patients require a detailed investigation  61-80% Crippled Back pain impinges on all aspects of the patient's life. Positive intervention is required  81-100% Bed-bound  These patients are either bed-bound or exaggerating their symptoms  Bluford FORBES Zoe DELENA Karon DELENA, et al. Surgery versus conservative management of stable thoracolumbar fracture: the PRESTO feasibility RCT. [place] ([place]): [employer]; 2021 Nov. [facility] Technology Assessment, No. 25.62.) Appendix 3, Oswestry Disability Index category descriptors. Available from: FindJewel[employer].cz  Minimally Clinically Important Difference (MCID) = 12.8% NDI:  NECK DISABILITY INDEX  Date: 02/06/2024 Score  Pain intensity 2 = The pain is moderate at the moment  2. Personal care (washing, dressing, etc.) 1 =  I can look after myself normally but it causes extra pain  3. Lifting 2 = Pain prevents me lifting heavy weights off the floor, but I can manage if they are   conveniently placed, for example on a table  4. Reading 1 = I can read as much as I want to with slight pain in my neck  5. Headaches 4 = I have severe headaches, which come frequently   6. Concentration 2 = I have a fair degree of difficulty in concentrating when I want to  7. Work 3 =  I cannot do my usual work  8. Driving 1 =  I can drive my car as long as I want with slight pain in my neck  9. Sleeping 0 = I have no trouble sleeping  10. Recreation 3 = I am able to engage in a few of my usual recreation activities because of pain in   my neck  Total 19/50   Minimum Detectable Change (90% confidence): 5 points or 10% points  COGNITION: Overall cognitive status: Within functional limits for tasks assessed     SENSATION: [facility] patient has no complaints of numbness/tingling at this moment   POSTURE: rounded shoulders, forward head, and decreased lumbar lordosis  PALPATION: Significant point tenderness of left upper trap, rhomboids, cervical paraspinals. Significant hypomobility with pain centrally C4-T5. Hypomobility with pain centrally L1-4. Point tenderness of lumbar paraspinals and right hamstring  CERVICAL ROM:   Active ROM A/PROM (deg) eval  Flexion 31 P!  Extension 40 P!  Right lateral flexion 20 P!  Left lateral flexion 14  Right rotation 51  Left rotation 30 P!   (Blank rows = not tested)   LUMBAR ROM:   AROM eval  Flexion 50% P!  Extension 75% stretch  Right lateral flexion 50%  Left lateral flexion 25% P!  Right rotation WNL  Left rotation WNL   (Blank rows = not tested)  LOWER EXTREMITY MMT:    MMT Right eval Left eval  Hip flexion 4+ 4 P!  Hip extension    Hip abduction    Hip adduction    Hip internal rotation    Hip external rotation    Knee flexion 5 5  Knee extension 4+ 4  Ankle dorsiflexion    Ankle plantarflexion    Ankle inversion    Ankle eversion     (Blank rows = not tested)  LUMBAR SPECIAL TESTS:  Straight leg raise test:  Positive  FUNCTIONAL TESTS:  Squat to floor.  Increased base of support, hip ER compensation. Heels off ground. Returns to standing with use of UE on thigh  GAIT: Distance walked: 100 Assistive device utilized: None Level of assistance: Complete Independence Comments: Increased lateral trunk sway. Decreased hip extension at pre swing/terminal stance phasd  TREATMENT DATE:  [facility] Adult PT Treatment:                                                DATE: 02/18/24 Therapeutic Exercise: Nustep L2 8 min Neuromuscular re-ed: Open book 10/10 with breathing patterns Curl up with p-ball 10x B, 10/10 single arm  Supine hip fallouts RTB 10x B, 10/10 single leg Therapeutic Activity: Seated hamstring stretch 60s B Supine QL stretch 60s B Supine hor abd YTB 10x B, 10/10 single arm  [facility] Adult PT Treatment:                                                DATE: 02/10/24 Therapeutic Exercise: Nustep level 3 x 10 mins while gathering subjective and planning session with patient and working on endurance (LE only, painful on LUE) QL stretch 2x30 BIL Neuromuscular re-ed: Palloff press GTB x10 BIL PPT 5 hold 2x10  Rows GTB 2x10 - cues for posture PPT with alternating marching 2x30 LTR x2' BIL Lat pull down 25# 2x10 Therapeutic Activity: Hip hinge education and early deadlifting technique/form with hand slide down LE Manual Therapy: LAD on RLE   [facility] Adult PT Treatment:                                                DATE: 02/09/24 Therapeutic Exercise: Nustep level 3 x 8 mins while gathering subjective and planning session with patient (LE only, painful on LUE) Neuromuscular re-ed: Palloff press GTB 2x10 BIL Rows GTB 2x10 - cues for posture Shoulder extension GTB 2x10 - cues for posture Seated BIL ER with scap retraction RTB 2x10 - cues for chin tuck Seated horizontal abduction RTB 2x10 - cues for chin tuck Bridges 2x10 LTR x2' BIL Sidelying open books x10 BIL - cues for smaller ROM to  decrease pain   02/06/2024                                                                                                                                PATIENT EDUCATION:  Education details: Discussed objective findings with patient. Discussed POC including HEP, frequency, and anatomy/physiology of present condition Person educated: Patient Education method: Explanation, Demonstration, and Handouts Education comprehension: verbalized understanding, returned demonstration, and needs further education  HOME EXERCISE PROGRAM: Access Code: [email or web address] URL: [email or web address] Date: 02/06/2024 Prepared by: Alfonse Cords Diy  Exercises - Bilateral Shoulder Flexion Wall Slide with Towel  - 2 x daily - 7 x weekly - 2 sets - 10-15 reps - Cervical Extension AROM with Strap  - 2 x daily - 7 x weekly - 2 sets - 10 reps - Cervical Retraction at Wall  - 2 x daily - 7 x weekly - 2 sets - 10 reps - Seated Neck Sidebending Stretch  - 2 x daily - 7 x weekly - 2 sets - 15-30 seconds hold  ASSESSMENT:  CLINICAL IMPRESSION:  Focus of today's session was gentle stretch and postural corrections exercises following multiple TP injections earlier today.  Patient able to participate in all requested tasks w/o any aggravation of symptoms.    EVAL: Patient is a 38 y.o. female who was seen today for physical therapy evaluation and treatment for cervical, thoracic, and lumbar pain with bilateral LE symptoms. Area of focus for eval was cervical and left UE due to most intense/limiting pain in these areas. She presents with significant palpable trigger points and soft tissue restrictions of left upper trap, cervical paraspinals, rhomboids, and thoracic paraspinals. Also demonstrates significant hypomobility with CPAs C4-T5. Patient with poor lifting tolerance and difficulty performing work and household chores. Patient requires skilled PT services to address deficits and return to prior level of  function.   OBJECTIVE IMPAIRMENTS: decreased endurance, difficulty walking, decreased ROM, and decreased strength.   ACTIVITY LIMITATIONS: carrying, lifting, bending, standing, squatting, and caring for others  PARTICIPATION LIMITATIONS: meal prep, driving, shopping, community activity, and occupation  PERSONAL FACTORS: Age, Past/current experiences, and Time since onset of injury/illness/exacerbation are also affecting patient's functional outcome.   REHAB POTENTIAL: Good  CLINICAL DECISION MAKING: Evolving/moderate complexity  EVALUATION COMPLEXITY: Moderate   GOALS: Goals reviewed with patient? No  SHORT TERM GOALS: Target date: 03/05/2024   Patient will be independent with HEP to improve carryover of symptoms Baseline: See above Goal status: INITIAL  2.  Patient NDI score of 14/50 to show improved tolerance to activity Baseline: 19/50 Goal status: INITIAL  3.  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  Patient will be able walk greater than 3 hours without pain to be able to perform work and recreational tasks Baseline: Pain after 20 minutes Goal status: INITIAL  3.  Patient will be able to transition from floor to standing without pain to be able to perform child care tasks Baseline: Significant pain with transitions  Goal status: INITIAL PLAN:  PT FREQUENCY: 2x/week  PT DURATION: 10 weeks  PLANNED INTERVENTIONS: 97164- PT Re-evaluation, 97750- Physical Performance Testing, 97110-Therapeutic exercises, 97530- Therapeutic activity, 97112- Neuromuscular re-education, 97535- Self Care, [id number]- Manual  therapy, [phone]- Gait training, [phone]- Aquatic Therapy, Patient/Family education, Balance training, Stair training, Taping, Joint mobilization, Joint manipulation, Spinal manipulation, Spinal mobilization, Cryotherapy, and Moist heat.  PLAN FOR NEXT SESSION: Assess lumbar spine and radicular symptoms   Charlcie Prisco M Jaspreet Hollings, PT 02/18/2024, 6:25 PM

## 2024-02-18 ENCOUNTER — Ambulatory Visit: Admitting: Sports Medicine

## 2024-02-18 ENCOUNTER — Other Ambulatory Visit: Payer: Self-pay

## 2024-02-18 ENCOUNTER — Ambulatory Visit

## 2024-02-18 VITALS — HR 84 | Ht 62.0 in | Wt 216.0 lb

## 2024-02-18 DIAGNOSIS — M9903 Segmental and somatic dysfunction of lumbar region: Secondary | ICD-10-CM

## 2024-02-18 DIAGNOSIS — M79604 Pain in right leg: Secondary | ICD-10-CM | POA: Diagnosis not present

## 2024-02-18 DIAGNOSIS — M9908 Segmental and somatic dysfunction of rib cage: Secondary | ICD-10-CM

## 2024-02-18 DIAGNOSIS — R293 Abnormal posture: Secondary | ICD-10-CM | POA: Diagnosis not present

## 2024-02-18 DIAGNOSIS — M79605 Pain in left leg: Secondary | ICD-10-CM | POA: Diagnosis not present

## 2024-02-18 DIAGNOSIS — G8929 Other chronic pain: Secondary | ICD-10-CM | POA: Diagnosis not present

## 2024-02-18 DIAGNOSIS — M9902 Segmental and somatic dysfunction of thoracic region: Secondary | ICD-10-CM | POA: Diagnosis not present

## 2024-02-18 DIAGNOSIS — M9901 Segmental and somatic dysfunction of cervical region: Secondary | ICD-10-CM | POA: Diagnosis not present

## 2024-02-18 DIAGNOSIS — M542 Cervicalgia: Secondary | ICD-10-CM

## 2024-02-18 DIAGNOSIS — M7711 Lateral epicondylitis, right elbow: Secondary | ICD-10-CM | POA: Diagnosis not present

## 2024-02-18 DIAGNOSIS — M9905 Segmental and somatic dysfunction of pelvic region: Secondary | ICD-10-CM | POA: Diagnosis not present

## 2024-02-18 DIAGNOSIS — M545 Low back pain, unspecified: Secondary | ICD-10-CM | POA: Diagnosis not present

## 2024-02-18 DIAGNOSIS — M546 Pain in thoracic spine: Secondary | ICD-10-CM | POA: Diagnosis not present

## 2024-02-18 DIAGNOSIS — M6281 Muscle weakness (generalized): Secondary | ICD-10-CM | POA: Diagnosis not present

## 2024-02-18 NOTE — Patient Instructions (Signed)
 4 week follow up for MSK or trigger point

## 2024-02-19 NOTE — Telephone Encounter (Signed)
 Called pt and discussed her concern. She stated that she began a period on 8/11 and had normal amount of bleeding for 5-7 days. The bleeding has continued however is not as heavy - now saturating one pad/Sharron Simpson. She also feels dizzy @ times. Pt is wondering of she is going through peri-menopause. I advised that is possible although not likely due to her age. She should monitor her bleeding and let us  know if it becomes heavy again for a prolonged period of time. Pt may begin taking OTC iron tablets if desired and report results to Dr. Herchel @ her next visit on 10/16. She voiced understanding.

## 2024-02-24 ENCOUNTER — Ambulatory Visit: Attending: Sports Medicine

## 2024-02-24 DIAGNOSIS — M542 Cervicalgia: Secondary | ICD-10-CM | POA: Insufficient documentation

## 2024-02-24 DIAGNOSIS — M6281 Muscle weakness (generalized): Secondary | ICD-10-CM | POA: Insufficient documentation

## 2024-02-24 DIAGNOSIS — M79604 Pain in right leg: Secondary | ICD-10-CM | POA: Diagnosis not present

## 2024-02-24 DIAGNOSIS — R293 Abnormal posture: Secondary | ICD-10-CM | POA: Diagnosis not present

## 2024-02-24 DIAGNOSIS — M545 Low back pain, unspecified: Secondary | ICD-10-CM | POA: Diagnosis not present

## 2024-02-24 DIAGNOSIS — M79605 Pain in left leg: Secondary | ICD-10-CM | POA: Insufficient documentation

## 2024-02-24 DIAGNOSIS — M546 Pain in thoracic spine: Secondary | ICD-10-CM | POA: Diagnosis not present

## 2024-02-24 NOTE — Therapy (Signed)
 OUTPATIENT PHYSICAL THERAPY TREATMENT NOTE   Patient Name: Candice [name] MRN: [id number] DOB:1985-09-10, 37 y.o., female Today's Date: 02/24/2024  END OF SESSION:  PT End of Session - 02/24/24 1702     Visit Number 5    Date for PT Re-Evaluation 04/09/24    Authorization Type BCBS, MCR    PT Start Time 1700    PT Stop Time 1740    PT Time Calculation (min) 40 min    Activity Tolerance Patient tolerated treatment well    Behavior During Therapy [facility] for tasks assessed/performed             Past Medical History:  Diagnosis Date   Abnormal MRI, spine 04/06/2020   Alpha thalassemia silent carrier 11/28/2022   No show genetic counseling virtual visit 6/3 - reschedule if desired     FOB declined testing.      Arthralgia 02/18/2022   Back pain 10/13/2011   Complication of anesthesia    epidural with last pregnancy made entire left side numb, had to d/c   Depression    GDM (gestational diabetes mellitus) 02/11/2023   GERD (gastroesophageal reflux disease) 05/21/2020   Gestational hypertension    History of reversal of tubal ligation 03/17/2023   HIV-1 (human immunodeficiency virus I) (HCC)    HSV 06/28/2007   Major depressive disorder, recurrent episode, moderate (HCC) 06/08/2013   Migraines    Multiple sclerosis (HCC)    Right leg numbness 02/07/2020   Vitamin D  deficiency 06/20/2022   Past Surgical History:  Procedure Laterality Date   BREAST SURGERY  2019   reduction   CESAREAN SECTION     CESAREAN SECTION  09/07/2011   Procedure: CESAREAN SECTION;  Surgeon: Vonn VEAR Inch, MD;  Location: [facility] ORS;  Service: Gynecology;  Laterality: N/A;  Repeat cesarean section with delivery of baby boy at 44. Apgars 9/9.  Bilateral tubal ligation.   CESAREAN SECTION N/A 04/25/2023   Procedure: CESAREAN SECTION;  Surgeon: Nicholaus Burnard HERO, MD;  Location: [facility] LD ORS;  Service: Obstetrics;  Laterality: N/A;   IUD REMOVAL  2005   in cervix   TUBAL LIGATION     tubal reanastomosis   08/13/2016   Patient Active Problem List   Diagnosis Date Noted   History of depression 01/13/2022   MS (multiple sclerosis) (HCC) 05/21/2020   Healthcare maintenance 02/10/2020   HIV-1 (human immunodeficiency virus I) (HCC) 09/07/2019   Genital herpes 02/16/2013   Tobacco use 03/31/2009    PCP: Kathrine Melena, DO  REFERRING PROVIDER: Morene Mace, DO  REFERRING DIAG: Cervical, thoracic, lumbar pain  Rationale for Evaluation and Treatment: Rehabilitation  THERAPY DIAG:  Cervical pain (neck)  Lumbar pain with radiation down both legs  Muscle weakness (generalized)  Abnormal posture  ONSET DATE: 04/25/2023 after birth of child  SUBJECTIVE:  SUBJECTIVE STATEMENT:  Patient reporting some pain today. No noticeable changes following her TP injection last week.   EVAL: Patient reports that her pain tends be in her left cervical and thoracic region. But patient reports that she can have pain in her whole body from her neck down to her lumbar spine. Also reports multiple instances per week when pain radiates from lumbar spine to bilateral LE with right LE being worse down to right foot. Reports radiating symptoms only down to left mid calf. Patient reports pain is worse with increased activity. Patient reports regular course of steroid injections with Dr. Leonce. States most recent injections were in left cervical and thoracic region. Patient reports that her left cervical and thoracic region are worst areas.   PERTINENT HISTORY:  HIV, MS, Depression  PAIN:  Are you having pain? Yes: NPRS scale: 2/10 currently. 10/10 at worst after activity Pain location: At eval addressing left cervical and thoracic Pain description: deep throbbing/ache, numbness/tingling in right foot/leg, occasionally has  sharp/stabbing pains Aggravating factors: lifting, carrying child, standing more than 20 minutes, squatting, floor transitions Relieving factors: ibuprofen , stretches, heat/hot showers  PRECAUTIONS: None  RED FLAGS: None   WEIGHT BEARING RESTRICTIONS: No  FALLS:  Has patient fallen in last 6 months? No  LIVING ENVIRONMENT: Lives with: lives with their family Lives in: House/apartment Stairs: Yes: External: 8 steps; on right going up Has following equipment at home: None  OCCUPATION: Production designer, theatre/television/film at [employer]  PLOF: Independent  PATIENT GOALS: Be able to walk and hike without pain. Get through a work day without pain  NEXT MD VISIT: Patient unsure of date  OBJECTIVE:  Note: Objective measures were completed at Evaluation unless otherwise noted.  DIAGNOSTIC FINDINGS:  Significant point tenderness of left upper trap, rhomboids, cervical paraspinals. Significant hypomobility with pain centrally C4-T5. Hypomobility with pain centrally L1-4. Point tenderness of lumbar paraspinals and right hamstring  PATIENT SURVEYS:  Modified Oswestry:  MODIFIED OSWESTRY DISABILITY SCALE  Date: 02/06/2024 Score  Pain intensity 3 =  Pain medication provides me with moderate relief from pain.  2. Personal care (washing, dressing, etc.) 1 =  I can take care of myself normally, but it increases my pain.  3. Lifting 2 = Pain prevents me from lifting heavy weights off the floor, activities (eg. sports, dancing). but I can manage if the weights are conveniently positioned (3) Pain prevents me from going out very often. (eg, on a table).  4. Walking 2 =  Pain prevents me from walking more than  mile.  5. Sitting 2 =  Pain prevents me from sitting more than 1 hour.  6. Standing 1 =  I can stand as long as I want but, it increases my pain.  7. Sleeping 0 = Pain does not prevent me from sleeping well.  8. Social Life 2 = Pain prevents me from participating in more energetic activities (eg. sports,  dancing).  9. Traveling 0 =  I can travel anywhere without increased pain.  10. Employment/ Homemaking 2 = I can perform most of my homemaking/job duties, but pain prevents me from performing more physically stressful activities (eg, lifting, vacuuming).  Total 15/50   Interpretation of scores: Score Category Description  0-20% Minimal Disability The patient can cope with most living activities. Usually no treatment is indicated apart from advice on lifting, sitting and exercise  21-40% Moderate Disability The patient experiences more pain and difficulty with sitting, lifting and standing. Travel and social life are  more difficult and they may be disabled from work. Personal care, sexual activity and sleeping are not grossly affected, and the patient can usually be managed by conservative means  41-60% Severe Disability Pain remains the main problem in this group, but activities of daily living are affected. These patients require a detailed investigation  61-80% Crippled Back pain impinges on all aspects of the patient's life. Positive intervention is required  81-100% Bed-bound  These patients are either bed-bound or exaggerating their symptoms  Bluford FORBES Zoe DELENA Karon DELENA, et al. Surgery versus conservative management of stable thoracolumbar fracture: the PRESTO feasibility RCT. [place]): [employer]; 2021 Nov. [facility] [employer] Assessment, No. 25.62.) Appendix 3, Oswestry Disability Index category descriptors. Available from: FindJewelers.cz  Minimally Clinically Important Difference (MCID) = 12.8% NDI:  NECK DISABILITY INDEX  Date: 02/06/2024 Score  Pain intensity 2 = The pain is moderate at the moment  2. Personal care (washing, dressing, etc.) 1 =  I can look after myself normally but it causes extra pain  3. Lifting 2 = Pain prevents me lifting heavy weights off the floor, but I can manage if they are  conveniently placed, for example on  a table  4. Reading 1 = I can read as much as I want to with slight pain in my neck  5. Headaches 4 = I have severe headaches, which come frequently   6. Concentration 2 = I have a fair degree of difficulty in concentrating when I want to  7. Work 3 =  I cannot do my usual work  8. Driving 1 =  I can drive my car as long as I want with slight pain in my neck  9. Sleeping 0 = I have no trouble sleeping  10. Recreation 3 = I am able to engage in a few of my usual recreation activities because of pain in   my neck  Total 19/50   Minimum Detectable Change (90% confidence): 5 points or 10% points  COGNITION: Overall cognitive status: Within functional limits for tasks assessed     SENSATION: [facility] patient has no complaints of numbness/tingling at this moment   POSTURE: rounded shoulders, forward head, and decreased lumbar lordosis  PALPATION: Significant point tenderness of left upper trap, rhomboids, cervical paraspinals. Significant hypomobility with pain centrally C4-T5. Hypomobility with pain centrally L1-4. Point tenderness of lumbar paraspinals and right hamstring  CERVICAL ROM:   Active ROM A/PROM (deg) eval  Flexion 31 P!  Extension 40 P!  Right lateral flexion 20 P!  Left lateral flexion 14  Right rotation 51  Left rotation 30 P!   (Blank rows = not tested)   LUMBAR ROM:   AROM eval  Flexion 50% P!  Extension 75% stretch  Right lateral flexion 50%  Left lateral flexion 25% P!  Right rotation WNL  Left rotation WNL   (Blank rows = not tested)  LOWER EXTREMITY MMT:    MMT Right eval Left eval  Hip flexion 4+ 4 P!  Hip extension    Hip abduction    Hip adduction    Hip internal rotation    Hip external rotation    Knee flexion 5 5  Knee extension 4+ 4  Ankle dorsiflexion    Ankle plantarflexion    Ankle inversion    Ankle eversion     (Blank rows = not tested)  LUMBAR SPECIAL TESTS:  Straight leg raise test: Positive  FUNCTIONAL TESTS:  Squat to  floor. Increased base  of support, hip ER compensation. Heels off ground. Returns to standing with use of UE on thigh  GAIT: Distance walked: 100 Assistive device utilized: None Level of assistance: Complete Independence Comments: Increased lateral trunk sway. Decreased hip extension at pre swing/terminal stance phasd  TREATMENT DATE:   [facility] Adult PT Treatment:                                                DATE: 02/24/2024  Therapeutic Exercise: Nustep L2 8 min Open book 10/10 with breathing patterns LTR x2' BIL PPT with alternating marching 2x10 Supine hor abd RTB 10x B, 10/10 single arm  Neuromuscular re-ed: Supine hip fallouts RTB 10x B, 10/10 single leg Rows GTB 2x10 - cues for posture Lat pull down 25# 2x10    [facility] Adult PT Treatment:                                                DATE: 02/18/24 Therapeutic Exercise: Nustep L2 8 min Neuromuscular re-ed: Open book 10/10 with breathing patterns Curl up with p-ball 10x B, 10/10 single arm  Supine hip fallouts RTB 10x B, 10/10 single leg Therapeutic Activity: Seated hamstring stretch 60s B Supine QL stretch 60s B Supine hor abd YTB 10x B, 10/10 single arm  [facility] Adult PT Treatment:                                                DATE: 02/10/24 Therapeutic Exercise: Nustep level 3 x 10 mins while gathering subjective and planning session with patient and working on endurance (LE only, painful on LUE) QL stretch 2x30 BIL  Neuromuscular re-ed: Palloff press GTB x10 BIL PPT 5 hold 2x10  Rows GTB 2x10 - cues for posture PPT with alternating marching 2x30 LTR x2' BIL Lat pull down 25# 2x10  Therapeutic Activity: Hip hinge education and early deadlifting technique/form with hand slide down LE  Manual Therapy: LAD on RLE   [facility] Adult PT Treatment:                                                DATE: 02/09/24 Therapeutic Exercise: Nustep level 3 x 8 mins while gathering subjective and planning session with patient (LE  only, painful on LUE) Neuromuscular re-ed: Palloff press GTB 2x10 BIL Rows GTB 2x10 - cues for posture Shoulder extension GTB 2x10 - cues for posture Seated BIL ER with scap retraction RTB 2x10 - cues for chin tuck Seated horizontal abduction RTB 2x10 - cues for chin tuck Bridges 2x10 LTR x2' BIL Sidelying open books x10 BIL - cues for smaller ROM to decrease pain     PATIENT EDUCATION:  Education details: Discussed objective findings with patient. Discussed POC including HEP, frequency, and anatomy/physiology of present condition Person educated: Patient Education method: Explanation, Demonstration, and Handouts Education comprehension: verbalized understanding, returned demonstration, and needs further education  HOME EXERCISE PROGRAM: Access Code: [email or web address] URL: [email or web address] Date: 02/06/2024 Prepared by: Alfonse  Nicanor Diy  Exercises - Bilateral Shoulder Flexion Wall Slide with Towel  - 2 x daily - 7 x weekly - 2 sets - 10-15 reps - Cervical Extension AROM with Strap  - 2 x daily - 7 x weekly - 2 sets - 10 reps - Cervical Retraction at Wall  - 2 x daily - 7 x weekly - 2 sets - 10 reps - Seated Neck Sidebending Stretch  - 2 x daily - 7 x weekly - 2 sets - 15-30 seconds hold  ASSESSMENT:  CLINICAL IMPRESSION:    Leonard had good tolerance of today's treatment session, without exacerbation of symptoms. We will continue to progress per POC as tolerated, in order to reach established rehab goals.    EVAL: Patient is a 38 y.o. female who was seen today for physical therapy evaluation and treatment for cervical, thoracic, and lumbar pain with bilateral LE symptoms. Area of focus for eval was cervical and left UE due to most intense/limiting pain in these areas. She presents with significant palpable trigger points and soft tissue restrictions of left upper trap, cervical paraspinals, rhomboids, and thoracic paraspinals. Also demonstrates significant  hypomobility with CPAs C4-T5. Patient with poor lifting tolerance and difficulty performing work and household chores. Patient requires skilled PT services to address deficits and return to prior level of function.   OBJECTIVE IMPAIRMENTS: decreased endurance, difficulty walking, decreased ROM, and decreased strength.   ACTIVITY LIMITATIONS: carrying, lifting, bending, standing, squatting, and caring for others  PARTICIPATION LIMITATIONS: meal prep, driving, shopping, community activity, and occupation  PERSONAL FACTORS: Age, Past/current experiences, and Time since onset of injury/illness/exacerbation are also affecting patient's functional outcome.   REHAB POTENTIAL: Good  CLINICAL DECISION MAKING: Evolving/moderate complexity  EVALUATION COMPLEXITY: Moderate   GOALS: Goals reviewed with patient? No  SHORT TERM GOALS: Target date: 03/05/2024   Patient will be independent with HEP to improve carryover of symptoms Baseline: See above Goal status: INITIAL  2.  Patient NDI score of 14/50 to show improved tolerance to activity Baseline: 19/50 Goal status: INITIAL  3.  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  Patient will be able walk greater than 3 hours without pain to be able to perform work and recreational tasks Baseline: Pain after 20 minutes Goal status: INITIAL  3.  Patient will be able to transition from floor to standing without pain to be able to perform child care tasks Baseline: Significant pain with transitions  Goal status: INITIAL PLAN:  PT FREQUENCY: 2x/week  PT DURATION: 10 weeks  PLANNED  INTERVENTIONS: 97164- PT Re-evaluation, 97750- Physical Performance Testing, 97110-Therapeutic exercises, 97530- Therapeutic activity, 97112- Neuromuscular re-education, 97535- Self Care, [id number]- Manual therapy, [phone]- Gait training, [phone]- Aquatic Therapy, Patient/Family education, Balance training, Stair training, Taping, Joint mobilization, Joint manipulation, Spinal manipulation, Spinal mobilization, Cryotherapy, and Moist heat.  PLAN FOR NEXT SESSION: Assess lumbar spine and radicular symptoms   Marko Molt, PT, DPT  02/24/2024 6:50 PM

## 2024-02-27 ENCOUNTER — Ambulatory Visit

## 2024-02-27 DIAGNOSIS — M79604 Pain in right leg: Secondary | ICD-10-CM | POA: Diagnosis not present

## 2024-02-27 DIAGNOSIS — M546 Pain in thoracic spine: Secondary | ICD-10-CM

## 2024-02-27 DIAGNOSIS — M6281 Muscle weakness (generalized): Secondary | ICD-10-CM | POA: Diagnosis not present

## 2024-02-27 DIAGNOSIS — R293 Abnormal posture: Secondary | ICD-10-CM

## 2024-02-27 DIAGNOSIS — M545 Low back pain, unspecified: Secondary | ICD-10-CM

## 2024-02-27 DIAGNOSIS — M542 Cervicalgia: Secondary | ICD-10-CM | POA: Diagnosis not present

## 2024-02-27 DIAGNOSIS — M79605 Pain in left leg: Secondary | ICD-10-CM | POA: Diagnosis not present

## 2024-02-27 NOTE — Therapy (Signed)
 OUTPATIENT PHYSICAL THERAPY TREATMENT NOTE   Patient Name: Candice [name] MRN: [id number] DOB:[birth year], 38 y.o., female Today's Date: 02/27/2024  END OF SESSION:  PT End of Session - 02/27/24 0857     Visit Number 6    Date for PT Re-Evaluation 04/09/24    Authorization Type BCBS, MCR    Progress Note Due on Visit 10    PT Start Time 0900    PT Stop Time 0938    PT Time Calculation (min) 38 min    Activity Tolerance Patient tolerated treatment well    Behavior During Therapy [facility] for tasks assessed/performed          Past Medical History:  Diagnosis Date   Abnormal MRI, spine 04/06/2020   Alpha thalassemia silent carrier 11/28/2022   No show genetic counseling virtual visit 6/3 - reschedule if desired     FOB declined testing.      Arthralgia 02/18/2022   Back pain 10/13/2011   Complication of anesthesia    epidural with last pregnancy made entire left side numb, had to d/c   Depression    GDM (gestational diabetes mellitus) 02/11/2023   GERD (gastroesophageal reflux disease) 05/21/2020   Gestational hypertension    History of reversal of tubal ligation 03/17/2023   HIV-1 (human immunodeficiency virus I) (HCC)    HSV 06/28/2007   Major depressive disorder, recurrent episode, moderate (HCC) 06/08/2013   Migraines    Multiple sclerosis (HCC)    Right leg numbness 02/07/2020   Vitamin D  deficiency 06/20/2022   Past Surgical History:  Procedure Laterality Date   BREAST SURGERY  2019   reduction   CESAREAN SECTION     CESAREAN SECTION  09/07/2011   Procedure: CESAREAN SECTION;  Surgeon: Vonn VEAR Inch, MD;  Location: [facility];  Service: Gynecology;  Laterality: N/A;  Repeat cesarean section with delivery of baby boy at 50. Apgars 9/9.  Bilateral tubal ligation.   CESAREAN SECTION N/A 04/25/2023   Procedure: CESAREAN SECTION;  Surgeon: Nicholaus Burnard HERO, MD;  Location: [facility] ORS;  Service: Obstetrics;  Laterality: N/A;   IUD REMOVAL  2005   in cervix   TUBAL LIGATION      tubal reanastomosis  08/13/2016   Patient Active Problem List   Diagnosis Date Noted   History of depression 01/13/2022   MS (multiple sclerosis) (HCC) 05/21/2020   Healthcare maintenance 02/10/2020   HIV-1 (human immunodeficiency virus I) (HCC) 09/07/2019   Genital herpes 02/16/2013   Tobacco use 03/31/2009    PCP: Kathrine Melena, DO  REFERRING PROVIDER: Morene Mace, DO  REFERRING DIAG: Cervical, thoracic, lumbar pain  Rationale for Evaluation and Treatment: Rehabilitation  THERAPY DIAG:  Cervical pain (neck)  Lumbar pain with radiation down both legs  Muscle weakness (generalized)  Abnormal posture  Pain in thoracic spine  ONSET DATE: 04/25/2023 after birth of child  SUBJECTIVE:  SUBJECTIVE STATEMENT:   Patient reports that she is feeling better today than she did the other day, having some pain in her left shoulder.   EVAL: Patient reports that her pain tends be in her left cervical and thoracic region. But patient reports that she can have pain in her whole body from her neck down to her lumbar spine. Also reports multiple instances per week when pain radiates from lumbar spine to bilateral LE with right LE being worse down to right foot. Reports radiating symptoms only down to left mid calf. Patient reports pain is worse with increased activity. Patient reports regular course of steroid injections with Dr. Leonce. States most recent injections were in left cervical and thoracic region. Patient reports that her left cervical and thoracic region are worst areas.   PERTINENT HISTORY:  HIV, MS, Depression  PAIN:  Are you having pain? Yes: NPRS scale: 2/10 currently. 10/10 at worst after activity Pain location: At eval addressing left cervical and thoracic Pain description: deep  throbbing/ache, numbness/tingling in right foot/leg, occasionally has sharp/stabbing pains Aggravating factors: lifting, carrying child, standing more than 20 minutes, squatting, floor transitions Relieving factors: ibuprofen , stretches, heat/hot showers  PRECAUTIONS: None  RED FLAGS: None   WEIGHT BEARING RESTRICTIONS: No  FALLS:  Has patient fallen in last 6 months? No  LIVING ENVIRONMENT: Lives with: lives with their family Lives in: House/apartment Stairs: Yes: External: 8 steps; on right going up Has following equipment at home: None  OCCUPATION: Production designer, theatre/television/film at [employer]  PLOF: Independent  PATIENT GOALS: Be able to walk and hike without pain. Get through a work day without pain  NEXT MD VISIT: Patient unsure of date  OBJECTIVE:  Note: Objective measures were completed at Evaluation unless otherwise noted.  DIAGNOSTIC FINDINGS:  Significant point tenderness of left upper trap, rhomboids, cervical paraspinals. Significant hypomobility with pain centrally C4-T5. Hypomobility with pain centrally L1-4. Point tenderness of lumbar paraspinals and right hamstring  PATIENT SURVEYS:  Modified Oswestry:  MODIFIED OSWESTRY DISABILITY SCALE  Date: 02/06/2024 Score  Pain intensity 3 =  Pain medication provides me with moderate relief from pain.  2. Personal care (washing, dressing, etc.) 1 =  I can take care of myself normally, but it increases my pain.  3. Lifting 2 = Pain prevents me from lifting heavy weights off the floor, activities (eg. sports, dancing). but I can manage if the weights are conveniently positioned (3) Pain prevents me from going out very often. (eg, on a table).  4. Walking 2 =  Pain prevents me from walking more than  mile.  5. Sitting 2 =  Pain prevents me from sitting more than 1 hour.  6. Standing 1 =  I can stand as long as I want but, it increases my pain.  7. Sleeping 0 = Pain does not prevent me from sleeping well.  8. Social Life 2 = Pain  prevents me from participating in more energetic activities (eg. sports, dancing).  9. Traveling 0 =  I can travel anywhere without increased pain.  10. Employment/ Homemaking 2 = I can perform most of my homemaking/job duties, but pain prevents me from performing more physically stressful activities (eg, lifting, vacuuming).  Total 15/50   Interpretation of scores: Score Category Description  0-20% Minimal Disability The patient can cope with most living activities. Usually no treatment is indicated apart from advice on lifting, sitting and exercise  21-40% Moderate Disability The patient experiences more pain and difficulty with sitting,  lifting and standing. Travel and social life are more difficult and they may be disabled from work. Personal care, sexual activity and sleeping are not grossly affected, and the patient can usually be managed by conservative means  41-60% Severe Disability Pain remains the main problem in this group, but activities of daily living are affected. These patients require a detailed investigation  61-80% Crippled Back pain impinges on all aspects of the patient's life. Positive intervention is required  81-100% Bed-bound  These patients are either bed-bound or exaggerating their symptoms  Bluford FORBES Zoe DELENA Karon DELENA, et al. Surgery versus conservative management of stable thoracolumbar fracture: the PRESTO feasibility RCT. [place]): [employer]; 2021 Nov. [facility] Technology Assessment, No. 25.62.) Appendix 3, Oswestry Disability Index category descriptors. Available from: FindJewel[employer].cz  Minimally Clinically Important Difference (MCID) = 12.8% NDI:  NECK DISABILITY INDEX  Date: 02/06/2024 Score  Pain intensity 2 = The pain is moderate at the moment  2. Personal care (washing, dressing, etc.) 1 =  I can look after myself normally but it causes extra pain  3. Lifting 2 = Pain prevents me lifting heavy weights off the  floor, but I can manage if they are  conveniently placed, for example on a table  4. Reading 1 = I can read as much as I want to with slight pain in my neck  5. Headaches 4 = I have severe headaches, which come frequently   6. Concentration 2 = I have a fair degree of difficulty in concentrating when I want to  7. Work 3 =  I cannot do my usual work  8. Driving 1 =  I can drive my car as long as I want with slight pain in my neck  9. Sleeping 0 = I have no trouble sleeping  10. Recreation 3 = I am able to engage in a few of my usual recreation activities because of pain in   my neck  Total 19/50   Minimum Detectable Change (90% confidence): 5 points or 10% points  COGNITION: Overall cognitive status: Within functional limits for tasks assessed     SENSATION: [facility] patient has no complaints of numbness/tingling at this moment   POSTURE: rounded shoulders, forward head, and decreased lumbar lordosis  PALPATION: Significant point tenderness of left upper trap, rhomboids, cervical paraspinals. Significant hypomobility with pain centrally C4-T5. Hypomobility with pain centrally L1-4. Point tenderness of lumbar paraspinals and right hamstring  CERVICAL ROM:   Active ROM A/PROM (deg) eval  Flexion 31 P!  Extension 40 P!  Right lateral flexion 20 P!  Left lateral flexion 14  Right rotation 51  Left rotation 30 P!   (Blank rows = not tested)   LUMBAR ROM:   AROM eval  Flexion 50% P!  Extension 75% stretch  Right lateral flexion 50%  Left lateral flexion 25% P!  Right rotation WNL  Left rotation WNL   (Blank rows = not tested)  LOWER EXTREMITY MMT:    MMT Right eval Left eval  Hip flexion 4+ 4 P!  Hip extension    Hip abduction    Hip adduction    Hip internal rotation    Hip external rotation    Knee flexion 5 5  Knee extension 4+ 4  Ankle dorsiflexion    Ankle plantarflexion    Ankle inversion    Ankle eversion     (Blank rows = not tested)  LUMBAR SPECIAL  TESTS:  Straight leg raise test: Positive  FUNCTIONAL TESTS:  Squat to floor. Increased base of support, hip ER compensation. Heels off ground. Returns to standing with use of UE on thigh  GAIT: Distance walked: 100 Assistive device utilized: None Level of assistance: Complete Independence Comments: Increased lateral trunk sway. Decreased hip extension at pre swing/terminal stance phasd  TREATMENT DATE:  [facility] Adult PT Treatment:                                                DATE: 02/27/24 Therapeutic Exercise: Nustep L4 8 min Seated pball roll outs fwd/lat x10 ea Neuromuscular re-ed: Lat pull down 25# 2x10 PPT with alternating marching x30 High/low rows 25# 2x10 ea Seated horizontal abduction RTB 2x10 Therapeutic Activity: STS with 10# KB 2x10  [facility] Adult PT Treatment:                                                DATE: 02/24/2024  Therapeutic Exercise: Nustep L2 8 min Open book 10/10 with breathing patterns LTR x2' BIL PPT with alternating marching 2x10 Supine hor abd RTB 10x B, 10/10 single arm  Neuromuscular re-ed: Supine hip fallouts RTB 10x B, 10/10 single leg Rows GTB 2x10 - cues for posture Lat pull down 25# 2x10    [facility] Adult PT Treatment:                                                DATE: 02/18/24 Therapeutic Exercise: Nustep L2 8 min Neuromuscular re-ed: Open book 10/10 with breathing patterns Curl up with p-ball 10x B, 10/10 single arm  Supine hip fallouts RTB 10x B, 10/10 single leg Therapeutic Activity: Seated hamstring stretch 60s B Supine QL stretch 60s B Supine hor abd YTB 10x B, 10/10 single arm    PATIENT EDUCATION:  Education details: Discussed objective findings with patient. Discussed POC including HEP, frequency, and anatomy/physiology of present condition Person educated: Patient Education method: Explanation, Demonstration, and Handouts Education comprehension: verbalized understanding, returned demonstration, and needs further  education  HOME EXERCISE PROGRAM: Access Code: [email or web address] URL: [email or web address] Date: 02/06/2024 Prepared by: Alfonse Cords Diy  Exercises - Bilateral Shoulder Flexion Wall Slide with Towel  - 2 x daily - 7 x weekly - 2 sets - 10-15 reps - Cervical Extension AROM with Strap  - 2 x daily - 7 x weekly - 2 sets - 10 reps - Cervical Retraction at Wall  - 2 x daily - 7 x weekly - 2 sets - 10 reps - Seated Neck Sidebending Stretch  - 2 x daily - 7 x weekly - 2 sets - 15-30 seconds hold  ASSESSMENT:  CLINICAL IMPRESSION:   Patient presents to PT reporting improved pain from last session, has some pain in her left shoulder today. Session today focused on periscapular and core strengthening as well as lumbar mobility. Patient was able to tolerate all prescribed exercises with no adverse effects. Patient continues to benefit from skilled PT services and should be progressed as able to improve functional independence.    EVAL: Patient is a 38 y.o. female who was seen today for physical therapy  evaluation and treatment for cervical, thoracic, and lumbar pain with bilateral LE symptoms. Area of focus for eval was cervical and left UE due to most intense/limiting pain in these areas. She presents with significant palpable trigger points and soft tissue restrictions of left upper trap, cervical paraspinals, rhomboids, and thoracic paraspinals. Also demonstrates significant hypomobility with CPAs C4-T5. Patient with poor lifting tolerance and difficulty performing work and household chores. Patient requires skilled PT services to address deficits and return to prior level of function.   OBJECTIVE IMPAIRMENTS: decreased endurance, difficulty walking, decreased ROM, and decreased strength.   ACTIVITY LIMITATIONS: carrying, lifting, bending, standing, squatting, and caring for others  PARTICIPATION LIMITATIONS: meal prep, driving, shopping, community activity, and occupation  PERSONAL  FACTORS: Age, Past/current experiences, and Time since onset of injury/illness/exacerbation are also affecting patient's functional outcome.   REHAB POTENTIAL: Good  CLINICAL DECISION MAKING: Evolving/moderate complexity  EVALUATION COMPLEXITY: Moderate   GOALS: Goals reviewed with patient? No  SHORT TERM GOALS: Target date: 03/05/2024   Patient will be independent with HEP to improve carryover of symptoms Baseline: See above Goal status: INITIAL  2.  Patient NDI score of 14/50 to show improved tolerance to activity Baseline: 19/50 Goal status: INITIAL  3.  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  Patient will be able walk greater than 3 hours without pain to be able to perform work and recreational tasks Baseline: Pain after 20 minutes Goal status: INITIAL  3.  Patient will be able to transition from floor to standing without pain to be able to perform child care tasks Baseline: Significant pain with transitions  Goal status: INITIAL PLAN:  PT FREQUENCY: 2x/week  PT DURATION: 10 weeks  PLANNED INTERVENTIONS: 97164- PT Re-evaluation, 97750- Physical Performance Testing, 97110-Therapeutic exercises, 97530- Therapeutic activity, 97112- Neuromuscular re-education, 97535- Self Care, [id number]- Manual therapy, [phone]- Gait training, [phone]- Aquatic Therapy, Patient/Family education, Balance training, Stair training, Taping, Joint mobilization, Joint manipulation, Spinal manipulation, Spinal mobilization, Cryotherapy, and Moist heat.  PLAN FOR NEXT SESSION: Assess lumbar spine and radicular symptoms   Corean Pouch PTA  02/27/2024 9:41 AM

## 2024-02-29 ENCOUNTER — Other Ambulatory Visit: Payer: Self-pay

## 2024-03-01 ENCOUNTER — Other Ambulatory Visit: Payer: Self-pay

## 2024-03-01 ENCOUNTER — Ambulatory Visit

## 2024-03-01 DIAGNOSIS — M79605 Pain in left leg: Secondary | ICD-10-CM | POA: Diagnosis not present

## 2024-03-01 DIAGNOSIS — R293 Abnormal posture: Secondary | ICD-10-CM | POA: Diagnosis not present

## 2024-03-01 DIAGNOSIS — M542 Cervicalgia: Secondary | ICD-10-CM | POA: Diagnosis not present

## 2024-03-01 DIAGNOSIS — M546 Pain in thoracic spine: Secondary | ICD-10-CM | POA: Diagnosis not present

## 2024-03-01 DIAGNOSIS — M545 Low back pain, unspecified: Secondary | ICD-10-CM | POA: Diagnosis not present

## 2024-03-01 DIAGNOSIS — M6281 Muscle weakness (generalized): Secondary | ICD-10-CM

## 2024-03-01 DIAGNOSIS — M79604 Pain in right leg: Secondary | ICD-10-CM | POA: Diagnosis not present

## 2024-03-01 NOTE — Therapy (Signed)
 OUTPATIENT PHYSICAL THERAPY TREATMENT NOTE   Patient Name: Candice [name] MRN: [id number] DOB:1985-11-02, 38 y.o., female Today's Date: 03/01/2024  END OF SESSION:  PT End of Session - 03/01/24 1701     Visit Number 7    Date for PT Re-Evaluation 04/09/24    Authorization Type BCBS, MCR    Progress Note Due on Visit 10    PT Start Time 1700    PT Stop Time 1740    PT Time Calculation (min) 40 min    Activity Tolerance Patient tolerated treatment well    Behavior During Therapy [facility] for tasks assessed/performed           Past Medical History:  Diagnosis Date   Abnormal MRI, spine 04/06/2020   Alpha thalassemia silent carrier 11/28/2022   No show genetic counseling virtual visit 6/3 - reschedule if desired     FOB declined testing.      Arthralgia 02/18/2022   Back pain 10/13/2011   Complication of anesthesia    epidural with last pregnancy made entire left side numb, had to d/c   Depression    GDM (gestational diabetes mellitus) 02/11/2023   GERD (gastroesophageal reflux disease) 05/21/2020   Gestational hypertension    History of reversal of tubal ligation 03/17/2023   HIV-1 (human immunodeficiency virus I) (HCC)    HSV 06/28/2007   Major depressive disorder, recurrent episode, moderate (HCC) 06/08/2013   Migraines    Multiple sclerosis (HCC)    Right leg numbness 02/07/2020   Vitamin D  deficiency 06/20/2022   Past Surgical History:  Procedure Laterality Date   BREAST SURGERY  2019   reduction   CESAREAN SECTION     CESAREAN SECTION  09/07/2011   Procedure: CESAREAN SECTION;  Surgeon: Vonn VEAR Inch, MD;  Location: [facility] ORS;  Service: Gynecology;  Laterality: N/A;  Repeat cesarean section with delivery of baby boy at 98. Apgars 9/9.  Bilateral tubal ligation.   CESAREAN SECTION N/A 04/25/2023   Procedure: CESAREAN SECTION;  Surgeon: Nicholaus Burnard HERO, MD;  Location: [facility] ORS;  Service: Obstetrics;  Laterality: N/A;   IUD REMOVAL  2005   in cervix   TUBAL  LIGATION     tubal reanastomosis  08/13/2016   Patient Active Problem List   Diagnosis Date Noted   History of depression 01/13/2022   MS (multiple sclerosis) (HCC) 05/21/2020   Healthcare maintenance 02/10/2020   HIV-1 (human immunodeficiency virus I) (HCC) 09/07/2019   Genital herpes 02/16/2013   Tobacco use 03/31/2009    PCP: Kathrine Melena, DO  REFERRING PROVIDER: Morene Mace, DO  REFERRING DIAG: Cervical, thoracic, lumbar pain  Rationale for Evaluation and Treatment: Rehabilitation  THERAPY DIAG:  Cervical pain (neck)  Lumbar pain with radiation down both legs  Muscle weakness (generalized)  Abnormal posture  Pain in thoracic spine  ONSET DATE: 04/25/2023 after birth of child  SUBJECTIVE:  SUBJECTIVE STATEMENT:   Patient reports some upper back pain today, has been taking some managerial classes so has been sitting for a few days.   EVAL: Patient reports that her pain tends be in her left cervical and thoracic region. But patient reports that she can have pain in her whole body from her neck down to her lumbar spine. Also reports multiple instances per week when pain radiates from lumbar spine to bilateral LE with right LE being worse down to right foot. Reports radiating symptoms only down to left mid calf. Patient reports pain is worse with increased activity. Patient reports regular course of steroid injections with Dr. Leonce. States most recent injections were in left cervical and thoracic region. Patient reports that her left cervical and thoracic region are worst areas.   PERTINENT HISTORY:  HIV, MS, Depression  PAIN:  Are you having pain? Yes: NPRS scale: 2/10 currently. 10/10 at worst after activity Pain location: At eval addressing left cervical and thoracic Pain  description: deep throbbing/ache, numbness/tingling in right foot/leg, occasionally has sharp/stabbing pains Aggravating factors: lifting, carrying child, standing more than 20 minutes, squatting, floor transitions Relieving factors: ibuprofen , stretches, heat/hot showers  PRECAUTIONS: None  RED FLAGS: None   WEIGHT BEARING RESTRICTIONS: No  FALLS:  Has patient fallen in last 6 months? No  LIVING ENVIRONMENT: Lives with: lives with their family Lives in: House/apartment Stairs: Yes: External: 8 steps; on right going up Has following equipment at home: None  OCCUPATION: Production designer, theatre/television/film at [employer]  PLOF: Independent  PATIENT GOALS: Be able to walk and hike without pain. Get through a work day without pain  NEXT MD VISIT: Patient unsure of date  OBJECTIVE:  Note: Objective measures were completed at Evaluation unless otherwise noted.  DIAGNOSTIC FINDINGS:  Significant point tenderness of left upper trap, rhomboids, cervical paraspinals. Significant hypomobility with pain centrally C4-T5. Hypomobility with pain centrally L1-4. Point tenderness of lumbar paraspinals and right hamstring  PATIENT SURVEYS:  Modified Oswestry:  MODIFIED OSWESTRY DISABILITY SCALE  Date: 02/06/2024 Score  Pain intensity 3 =  Pain medication provides me with moderate relief from pain.  2. Personal care (washing, dressing, etc.) 1 =  I can take care of myself normally, but it increases my pain.  3. Lifting 2 = Pain prevents me from lifting heavy weights off the floor, activities (eg. sports, dancing). but I can manage if the weights are conveniently positioned (3) Pain prevents me from going out very often. (eg, on a table).  4. Walking 2 =  Pain prevents me from walking more than  mile.  5. Sitting 2 =  Pain prevents me from sitting more than 1 hour.  6. Standing 1 =  I can stand as long as I want but, it increases my pain.  7. Sleeping 0 = Pain does not prevent me from sleeping well.  8.  Social Life 2 = Pain prevents me from participating in more energetic activities (eg. sports, dancing).  9. Traveling 0 =  I can travel anywhere without increased pain.  10. Employment/ Homemaking 2 = I can perform most of my homemaking/job duties, but pain prevents me from performing more physically stressful activities (eg, lifting, vacuuming).  Total 15/50   Interpretation of scores: Score Category Description  0-20% Minimal Disability The patient can cope with most living activities. Usually no treatment is indicated apart from advice on lifting, sitting and exercise  21-40% Moderate Disability The patient experiences more pain and difficulty with sitting,  lifting and standing. Travel and social life are more difficult and they may be disabled from work. Personal care, sexual activity and sleeping are not grossly affected, and the patient can usually be managed by conservative means  41-60% Severe Disability Pain remains the main problem in this group, but activities of daily living are affected. These patients require a detailed investigation  61-80% Crippled Back pain impinges on all aspects of the patient's life. Positive intervention is required  81-100% Bed-bound  These patients are either bed-bound or exaggerating their symptoms  Bluford FORBES Zoe DELENA Karon DELENA, et al. Surgery versus conservative management of stable thoracolumbar fracture: the PRESTO feasibility RCT. [place] ([place]): [employer]; 2021 Nov. [facility] Technology Assessment, No. 25.62.) Appendix 3, Oswestry Disability Index category descriptors. Available from: FindJewel[employer].cz  Minimally Clinically Important Difference (MCID) = 12.8% NDI:  NECK DISABILITY INDEX  Date: 02/06/2024 Score  Pain intensity 2 = The pain is moderate at the moment  2. Personal care (washing, dressing, etc.) 1 =  I can look after myself normally but it causes extra pain  3. Lifting 2 = Pain prevents me lifting  heavy weights off the floor, but I can manage if they are  conveniently placed, for example on a table  4. Reading 1 = I can read as much as I want to with slight pain in my neck  5. Headaches 4 = I have severe headaches, which come frequently   6. Concentration 2 = I have a fair degree of difficulty in concentrating when I want to  7. Work 3 =  I cannot do my usual work  8. Driving 1 =  I can drive my car as long as I want with slight pain in my neck  9. Sleeping 0 = I have no trouble sleeping  10. Recreation 3 = I am able to engage in a few of my usual recreation activities because of pain in   my neck  Total 19/50   Minimum Detectable Change (90% confidence): 5 points or 10% points  COGNITION: Overall cognitive status: Within functional limits for tasks assessed     SENSATION: [facility] patient has no complaints of numbness/tingling at this moment   POSTURE: rounded shoulders, forward head, and decreased lumbar lordosis  PALPATION: Significant point tenderness of left upper trap, rhomboids, cervical paraspinals. Significant hypomobility with pain centrally C4-T5. Hypomobility with pain centrally L1-4. Point tenderness of lumbar paraspinals and right hamstring  CERVICAL ROM:   Active ROM A/PROM (deg) eval  Flexion 31 P!  Extension 40 P!  Right lateral flexion 20 P!  Left lateral flexion 14  Right rotation 51  Left rotation 30 P!   (Blank rows = not tested)   LUMBAR ROM:   AROM eval  Flexion 50% P!  Extension 75% stretch  Right lateral flexion 50%  Left lateral flexion 25% P!  Right rotation WNL  Left rotation WNL   (Blank rows = not tested)  LOWER EXTREMITY MMT:    MMT Right eval Left eval  Hip flexion 4+ 4 P!  Hip extension    Hip abduction    Hip adduction    Hip internal rotation    Hip external rotation    Knee flexion 5 5  Knee extension 4+ 4  Ankle dorsiflexion    Ankle plantarflexion    Ankle inversion    Ankle eversion     (Blank rows = not  tested)  LUMBAR SPECIAL TESTS:  Straight leg raise test: Positive  FUNCTIONAL TESTS:  Squat to floor. Increased base of support, hip ER compensation. Heels off ground. Returns to standing with use of UE on thigh  GAIT: Distance walked: 100 Assistive device utilized: None Level of assistance: Complete Independence Comments: Increased lateral trunk sway. Decreased hip extension at pre swing/terminal stance phasd  TREATMENT DATE:  [facility] Adult PT Treatment:                                                DATE: 03/01/24 Therapeutic Exercise: Nustep L5 8 min Seated pball roll outs fwd/lat x10 ea Cybex knee extension 5# 2x10 Cybex knee flexion 20# 2x10 Neuromuscular re-ed: Lat pull down 35# x10 High/low rows 25# 2x10 ea Therapeutic Activity: Lunging to BOSU x10 LLE, x6 RLE (pain/fatigue in quads on right) Floor to stand transfer training on gym mat - use of UE to no UE, extended time required  [facility] Adult PT Treatment:                                                DATE: 02/27/24 Therapeutic Exercise: Nustep L4 8 min Seated pball roll outs fwd/lat x10 ea Neuromuscular re-ed: Lat pull down 25# 2x10 PPT with alternating marching x30 High/low rows 25# 2x10 ea Seated horizontal abduction RTB 2x10 Therapeutic Activity: STS with 10# KB 2x10  [facility] Adult PT Treatment:                                                DATE: 02/24/2024  Therapeutic Exercise: Nustep L2 8 min Open book 10/10 with breathing patterns LTR x2' BIL PPT with alternating marching 2x10 Supine hor abd RTB 10x B, 10/10 single arm  Neuromuscular re-ed: Supine hip fallouts RTB 10x B, 10/10 single leg Rows GTB 2x10 - cues for posture Lat pull down 25# 2x10    PATIENT EDUCATION:  Education details: Discussed objective findings with patient. Discussed POC including HEP, frequency, and anatomy/physiology of present condition Person educated: Patient Education method: Explanation, Demonstration, and Handouts Education  comprehension: verbalized understanding, returned demonstration, and needs further education  HOME EXERCISE PROGRAM: Access Code: [email or web address] URL: [email or web address] Date: 02/06/2024 Prepared by: Alfonse Cords Diy  Exercises - Bilateral Shoulder Flexion Wall Slide with Towel  - 2 x daily - 7 x weekly - 2 sets - 10-15 reps - Cervical Extension AROM with Strap  - 2 x daily - 7 x weekly - 2 sets - 10 reps - Cervical Retraction at Wall  - 2 x daily - 7 x weekly - 2 sets - 10 reps - Seated Neck Sidebending Stretch  - 2 x daily - 7 x weekly - 2 sets - 15-30 seconds hold  ASSESSMENT:  CLINICAL IMPRESSION:   Patient presents to PT reporting some pain in her upper back and that she has been off of work for a few days due to participating in managerial classes. Session today focused on functional strengthening and transfer from floor to standing. She is able to do so without UE support with extended time. She has some pain/muscular fatigue in quads on RLE with lunges, focused on quad  strengthening. Patient was able to tolerate all prescribed exercises with no adverse effects. Patient continues to benefit from skilled PT services and should be progressed as able to improve functional independence.   EVAL: Patient is a 38 y.o. female who was seen today for physical therapy evaluation and treatment for cervical, thoracic, and lumbar pain with bilateral LE symptoms. Area of focus for eval was cervical and left UE due to most intense/limiting pain in these areas. She presents with significant palpable trigger points and soft tissue restrictions of left upper trap, cervical paraspinals, rhomboids, and thoracic paraspinals. Also demonstrates significant hypomobility with CPAs C4-T5. Patient with poor lifting tolerance and difficulty performing work and household chores. Patient requires skilled PT services to address deficits and return to prior level of function.   OBJECTIVE IMPAIRMENTS:  decreased endurance, difficulty walking, decreased ROM, and decreased strength.   ACTIVITY LIMITATIONS: carrying, lifting, bending, standing, squatting, and caring for others  PARTICIPATION LIMITATIONS: meal prep, driving, shopping, community activity, and occupation  PERSONAL FACTORS: Age, Past/current experiences, and Time since onset of injury/illness/exacerbation are also affecting patient's functional outcome.   REHAB POTENTIAL: Good  CLINICAL DECISION MAKING: Evolving/moderate complexity  EVALUATION COMPLEXITY: Moderate   GOALS: Goals reviewed with patient? No  SHORT TERM GOALS: Target date: 03/05/2024   Patient will be independent with HEP to improve carryover of symptoms Baseline: See above Goal status: INITIAL  2.  Patient NDI score of 14/50 to show improved tolerance to activity Baseline: 19/50 Goal status: INITIAL  3.  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  Patient will be able walk greater than 3 hours without pain to be able to perform work and recreational tasks Baseline: Pain after 20 minutes Goal status: INITIAL  3.  Patient will be able to transition from floor to standing without pain to be able to perform child care tasks Baseline: Significant pain with transitions  Goal status: INITIAL PLAN:  PT FREQUENCY: 2x/week  PT DURATION: 10 weeks  PLANNED INTERVENTIONS: 97164- PT Re-evaluation, 97750- Physical Performance Testing, 97110-Therapeutic exercises, 97530- Therapeutic activity, 97112- Neuromuscular re-education, 97535- Self Care, [id number]- Manual therapy, [phone]- Gait training, [phone]-  Aquatic Therapy, Patient/Family education, Balance training, Stair training, Taping, Joint mobilization, Joint manipulation, Spinal manipulation, Spinal mobilization, Cryotherapy, and Moist heat.  PLAN FOR NEXT SESSION: Assess lumbar spine and radicular symptoms   Corean Pouch PTA  03/01/2024 5:42 PM

## 2024-03-03 ENCOUNTER — Ambulatory Visit: Admitting: Physical Therapy

## 2024-03-03 ENCOUNTER — Encounter: Payer: Self-pay | Admitting: Physical Therapy

## 2024-03-03 DIAGNOSIS — M546 Pain in thoracic spine: Secondary | ICD-10-CM | POA: Diagnosis not present

## 2024-03-03 DIAGNOSIS — M79605 Pain in left leg: Secondary | ICD-10-CM | POA: Diagnosis not present

## 2024-03-03 DIAGNOSIS — R293 Abnormal posture: Secondary | ICD-10-CM | POA: Diagnosis not present

## 2024-03-03 DIAGNOSIS — M545 Low back pain, unspecified: Secondary | ICD-10-CM | POA: Diagnosis not present

## 2024-03-03 DIAGNOSIS — M79604 Pain in right leg: Secondary | ICD-10-CM | POA: Diagnosis not present

## 2024-03-03 DIAGNOSIS — M542 Cervicalgia: Secondary | ICD-10-CM

## 2024-03-03 DIAGNOSIS — M6281 Muscle weakness (generalized): Secondary | ICD-10-CM | POA: Diagnosis not present

## 2024-03-03 NOTE — Therapy (Signed)
 OUTPATIENT PHYSICAL THERAPY TREATMENT NOTE   Patient Name: Candice [name] MRN: [id number] DOB:11-13-1985, 38 y.o., female Today's Date: 03/03/2024  END OF SESSION:  PT End of Session - 03/03/24 1658     Visit Number 8    Date for PT Re-Evaluation 04/09/24    Progress Note Due on Visit 10    PT Start Time 1700    PT Stop Time 1740    PT Time Calculation (min) 40 min    Activity Tolerance Patient tolerated treatment well    Behavior During Therapy [facility] for tasks assessed/performed           Past Medical History:  Diagnosis Date   Abnormal MRI, spine 04/06/2020   Alpha thalassemia silent carrier 11/28/2022   No show genetic counseling virtual visit 6/3 - reschedule if desired     FOB declined testing.      Arthralgia 02/18/2022   Back pain 10/13/2011   Complication of anesthesia    epidural with last pregnancy made entire left side numb, had to d/c   Depression    GDM (gestational diabetes mellitus) 02/11/2023   GERD (gastroesophageal reflux disease) 05/21/2020   Gestational hypertension    History of reversal of tubal ligation 03/17/2023   HIV-1 (human immunodeficiency virus I) (HCC)    HSV 06/28/2007   Major depressive disorder, recurrent episode, moderate (HCC) 06/08/2013   Migraines    Multiple sclerosis (HCC)    Right leg numbness 02/07/2020   Vitamin D  deficiency 06/20/2022   Past Surgical History:  Procedure Laterality Date   BREAST SURGERY  2019   reduction   CESAREAN SECTION     CESAREAN SECTION  09/07/2011   Procedure: CESAREAN SECTION;  Surgeon: Vonn VEAR Inch, MD;  Location: [facility];  Service: Gynecology;  Laterality: N/A;  Repeat cesarean section with delivery of baby boy at 7. Apgars 9/9.  Bilateral tubal ligation.   CESAREAN SECTION N/A 04/25/2023   Procedure: CESAREAN SECTION;  Surgeon: Nicholaus Burnard HERO, MD;  Location: [facility] LD ORS;  Service: Obstetrics;  Laterality: N/A;   IUD REMOVAL  2005   in cervix   TUBAL LIGATION     tubal reanastomosis   08/13/2016   Patient Active Problem List   Diagnosis Date Noted   History of depression 01/13/2022   MS (multiple sclerosis) (HCC) 05/21/2020   Healthcare maintenance 02/10/2020   HIV-1 (human immunodeficiency virus I) (HCC) 09/07/2019   Genital herpes 02/16/2013   Tobacco use 03/31/2009    PCP: Kathrine Melena, DO  REFERRING PROVIDER: Morene Mace, DO  REFERRING DIAG: Cervical, thoracic, lumbar pain  Rationale for Evaluation and Treatment: Rehabilitation  THERAPY DIAG:  Cervical pain (neck)  Lumbar pain with radiation down both legs  Muscle weakness (generalized)  ONSET DATE: 04/25/2023 after birth of child  SUBJECTIVE:  SUBJECTIVE STATEMENT:   Patient reports some upper back pain today, has been taking some managerial classes so has been sitting for a few days.   EVAL: Patient reports that her pain tends be in her left cervical and thoracic region. But patient reports that she can have pain in her whole body from her neck down to her lumbar spine. Also reports multiple instances per week when pain radiates from lumbar spine to bilateral LE with right LE being worse down to right foot. Reports radiating symptoms only down to left mid calf. Patient reports pain is worse with increased activity. Patient reports regular course of steroid injections with Dr. Leonce. States most recent injections were in left cervical and thoracic region. Patient reports that her left cervical and thoracic region are worst areas.   PERTINENT HISTORY:  HIV, MS, Depression  PAIN:  Are you having pain? Yes: NPRS scale: 2/10 currently. 10/10 at worst after activity Pain location: At eval addressing left cervical and thoracic Pain description: deep throbbing/ache, numbness/tingling in right foot/leg, occasionally has  sharp/stabbing pains Aggravating factors: lifting, carrying child, standing more than 20 minutes, squatting, floor transitions Relieving factors: ibuprofen , stretches, heat/hot showers  PRECAUTIONS: None  RED FLAGS: None   WEIGHT BEARING RESTRICTIONS: No  FALLS:  Has patient fallen in last 6 months? No  LIVING ENVIRONMENT: Lives with: lives with their family Lives in: House/apartment Stairs: Yes: External: 8 steps; on right going up Has following equipment at home: None  OCCUPATION: Production designer, theatre/television/film at [employer]  PLOF: Independent  PATIENT GOALS: Be able to walk and hike without pain. Get through a work day without pain  NEXT MD VISIT: Patient unsure of date  OBJECTIVE:  Note: Objective measures were completed at Evaluation unless otherwise noted.  DIAGNOSTIC FINDINGS:  Significant point tenderness of left upper trap, rhomboids, cervical paraspinals. Significant hypomobility with pain centrally C4-T5. Hypomobility with pain centrally L1-4. Point tenderness of lumbar paraspinals and right hamstring  PATIENT SURVEYS:  Modified Oswestry:  MODIFIED OSWESTRY DISABILITY SCALE  Date: 02/06/2024 Score  Pain intensity 3 =  Pain medication provides me with moderate relief from pain.  2. Personal care (washing, dressing, etc.) 1 =  I can take care of myself normally, but it increases my pain.  3. Lifting 2 = Pain prevents me from lifting heavy weights off the floor, activities (eg. sports, dancing). but I can manage if the weights are conveniently positioned (3) Pain prevents me from going out very often. (eg, on a table).  4. Walking 2 =  Pain prevents me from walking more than  mile.  5. Sitting 2 =  Pain prevents me from sitting more than 1 hour.  6. Standing 1 =  I can stand as long as I want but, it increases my pain.  7. Sleeping 0 = Pain does not prevent me from sleeping well.  8. Social Life 2 = Pain prevents me from participating in more energetic activities (eg. sports,  dancing).  9. Traveling 0 =  I can travel anywhere without increased pain.  10. Employment/ Homemaking 2 = I can perform most of my homemaking/job duties, but pain prevents me from performing more physically stressful activities (eg, lifting, vacuuming).  Total 15/50   Interpretation of scores: Score Category Description  0-20% Minimal Disability The patient can cope with most living activities. Usually no treatment is indicated apart from advice on lifting, sitting and exercise  21-40% Moderate Disability The patient experiences more pain and difficulty with sitting,  lifting and standing. Travel and social life are more difficult and they may be disabled from work. Personal care, sexual activity and sleeping are not grossly affected, and the patient can usually be managed by conservative means  41-60% Severe Disability Pain remains the main problem in this group, but activities of daily living are affected. These patients require a detailed investigation  61-80% Crippled Back pain impinges on all aspects of the patient's life. Positive intervention is required  81-100% Bed-bound  These patients are either bed-bound or exaggerating their symptoms  Bluford FORBES Zoe DELENA Karon DELENA, et al. Surgery versus conservative management of stable thoracolumbar fracture: the PRESTO feasibility RCT. [place]): [employer]; 2021 Nov. [facility] Technology Assessment, No. 25.62.) Appendix 3, Oswestry Disability Index category descriptors. Available from: FindJewelers.cz  Minimally Clinically Important Difference (MCID) = 12.8% NDI:  NECK DISABILITY INDEX  Date: 02/06/2024 Score  Pain intensity 2 = The pain is moderate at the moment  2. Personal care (washing, dressing, etc.) 1 =  I can look after myself normally but it causes extra pain  3. Lifting 2 = Pain prevents me lifting heavy weights off the floor, but I can manage if they are  conveniently placed, for example on  a table  4. Reading 1 = I can read as much as I want to with slight pain in my neck  5. Headaches 4 = I have severe headaches, which come frequently   6. Concentration 2 = I have a fair degree of difficulty in concentrating when I want to  7. Work 3 =  I cannot do my usual work  8. Driving 1 =  I can drive my car as long as I want with slight pain in my neck  9. Sleeping 0 = I have no trouble sleeping  10. Recreation 3 = I am able to engage in a few of my usual recreation activities because of pain in   my neck  Total 19/50   Minimum Detectable Change (90% confidence): 5 points or 10% points  COGNITION: Overall cognitive status: Within functional limits for tasks assessed     SENSATION: [facility] patient has no complaints of numbness/tingling at this moment   POSTURE: rounded shoulders, forward head, and decreased lumbar lordosis  PALPATION: Significant point tenderness of left upper trap, rhomboids, cervical paraspinals. Significant hypomobility with pain centrally C4-T5. Hypomobility with pain centrally L1-4. Point tenderness of lumbar paraspinals and right hamstring  CERVICAL ROM:   Active ROM A/PROM (deg) eval  Flexion 31 P!  Extension 40 P!  Right lateral flexion 20 P!  Left lateral flexion 14  Right rotation 51  Left rotation 30 P!   (Blank rows = not tested)   LUMBAR ROM:   AROM eval  Flexion 50% P!  Extension 75% stretch  Right lateral flexion 50%  Left lateral flexion 25% P!  Right rotation WNL  Left rotation WNL   (Blank rows = not tested)  LOWER EXTREMITY MMT:    MMT Right eval Left eval  Hip flexion 4+ 4 P!  Hip extension    Hip abduction    Hip adduction    Hip internal rotation    Hip external rotation    Knee flexion 5 5  Knee extension 4+ 4  Ankle dorsiflexion    Ankle plantarflexion    Ankle inversion    Ankle eversion     (Blank rows = not tested)  LUMBAR SPECIAL TESTS:  Straight leg raise test: Positive  FUNCTIONAL  TESTS:  Squat to  floor. Increased base of support, hip ER compensation. Heels off ground. Returns to standing with use of UE on thigh  GAIT: Distance walked: 100 Assistive device utilized: None Level of assistance: Complete Independence Comments: Increased lateral trunk sway. Decreased hip extension at pre swing/terminal stance phasd  TREATMENT DATE:  [facility] Adult PT Treatment:                                                DATE: 03/03/2024  Therapeutic Exercise: NuStep 8' Supine EOB KTC hip flexor stretch 2x1' Neuromuscular re-ed: Supine Pball roll up  2x12, hold 2s Supine lumbar rotation on pball 2x12B Supine bridge, feet flat on pball 2x10, hold 3s   [facility] Adult PT Treatment:                                                DATE: 03/01/24 Therapeutic Exercise: Nustep L5 8 min Seated pball roll outs fwd/lat x10 ea Cybex knee extension 5# 2x10 Cybex knee flexion 20# 2x10 Neuromuscular re-ed: Lat pull down 35# x10 High/low rows 25# 2x10 ea Therapeutic Activity: Lunging to BOSU x10 LLE, x6 RLE (pain/fatigue in quads on right) Floor to stand transfer training on gym mat - use of UE to no UE, extended time required  [facility] Adult PT Treatment:                                                DATE: 02/27/24 Therapeutic Exercise: Nustep L4 8 min Seated pball roll outs fwd/lat x10 ea Neuromuscular re-ed: Lat pull down 25# 2x10 PPT with alternating marching x30 High/low rows 25# 2x10 ea Seated horizontal abduction RTB 2x10 Therapeutic Activity: STS with 10# KB 2x10  [facility] Adult PT Treatment:                                                DATE: 02/24/2024  Therapeutic Exercise: Nustep L2 8 min Open book 10/10 with breathing patterns LTR x2' BIL PPT with alternating marching 2x10 Supine hor abd RTB 10x B, 10/10 single arm  Neuromuscular re-ed: Supine hip fallouts RTB 10x B, 10/10 single leg Rows GTB 2x10 - cues for posture Lat pull down 25# 2x10    PATIENT EDUCATION:  Education details:  Discussed objective findings with patient. Discussed POC including HEP, frequency, and anatomy/physiology of present condition Person educated: Patient Education method: Explanation, Demonstration, and Handouts Education comprehension: verbalized understanding, returned demonstration, and needs further education  HOME EXERCISE PROGRAM: Access Code: [email or web address] URL: [email or web address] Date: 02/06/2024 Prepared by: Alfonse Cords Diy  Exercises - Bilateral Shoulder Flexion Wall Slide with Towel  - 2 x daily - 7 x weekly - 2 sets - 10-15 reps - Cervical Extension AROM with Strap  - 2 x daily - 7 x weekly - 2 sets - 10 reps - Cervical Retraction at Wall  - 2 x daily - 7 x weekly - 2 sets - 10 reps - Seated Neck  Sidebending Stretch  - 2 x daily - 7 x weekly - 2 sets - 15-30 seconds hold  ASSESSMENT:  CLINICAL IMPRESSION:   Pt attended physical therapy session for continuation of treatment regarding back pain. Pt attended with primary reports of limiting mid-LBP more midline compared to previous left sided back pain. Today's treatment focused on improvement of  lumbar stability, posterior  chain strength/motility, and proximal hip strength. Pt showed great tolerance to administered treatment with no adverse effects by the end of session. Skilled intervention was utilized via activity modification for pt tolerance with task completion, functional progression/regression promoting best outcomes inline with current rehab goals, as well as minimal verbal/tactile cuing alongside no physical assistance for safe and appropriate performance of today's activities. Continue to progress in current POC focus as tolerated.   EVAL: Patient is a 38 y.o. female who was seen today for physical therapy evaluation and treatment for cervical, thoracic, and lumbar pain with bilateral LE symptoms. Area of focus for eval was cervical and left UE due to most intense/limiting pain in these areas. She presents  with significant palpable trigger points and soft tissue restrictions of left upper trap, cervical paraspinals, rhomboids, and thoracic paraspinals. Also demonstrates significant hypomobility with CPAs C4-T5. Patient with poor lifting tolerance and difficulty performing work and household chores. Patient requires skilled PT services to address deficits and return to prior level of function.   OBJECTIVE IMPAIRMENTS: decreased endurance, difficulty walking, decreased ROM, and decreased strength.   ACTIVITY LIMITATIONS: carrying, lifting, bending, standing, squatting, and caring for others  PARTICIPATION LIMITATIONS: meal prep, driving, shopping, community activity, and occupation  PERSONAL FACTORS: Age, Past/current experiences, and Time since onset of injury/illness/exacerbation are also affecting patient's functional outcome.   REHAB POTENTIAL: Good  CLINICAL DECISION MAKING: Evolving/moderate complexity  EVALUATION COMPLEXITY: Moderate   GOALS: Goals reviewed with patient? No  SHORT TERM GOALS: Target date: 03/05/2024   Patient will be independent with HEP to improve carryover of symptoms Baseline: See above Goal status: INITIAL  2.  Patient NDI score of 14/50 to show improved tolerance to activity Baseline: 19/50 Goal status: INITIAL  3.  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  Patient will be able walk greater than 3 hours without pain to be able to perform work and recreational tasks Baseline: Pain after 20 minutes Goal status: INITIAL  3.  Patient will be able to transition from floor to standing without pain  to be able to perform child care tasks Baseline: Significant pain with transitions  Goal status: INITIAL PLAN:  PT FREQUENCY: 2x/week  PT DURATION: 10 weeks  PLANNED INTERVENTIONS: 97164- PT Re-evaluation, 97750- Physical Performance Testing, 97110-Therapeutic exercises, 97530- Therapeutic activity, 97112- Neuromuscular re-education, 97535- Self Care, 02859- Manual therapy, [phone]- Gait training, [phone]- Aquatic Therapy, Patient/Family education, Balance training, Stair training, Taping, Joint mobilization, Joint manipulation, Spinal manipulation, Spinal mobilization, Cryotherapy, and Moist heat.  PLAN FOR NEXT SESSION:    Mabel Kiang, PT, DPT 03/03/2024, 5:40 PM

## 2024-03-05 ENCOUNTER — Ambulatory Visit

## 2024-03-07 ENCOUNTER — Other Ambulatory Visit: Payer: Self-pay

## 2024-03-07 ENCOUNTER — Other Ambulatory Visit (HOSPITAL_COMMUNITY): Payer: Self-pay

## 2024-03-07 NOTE — Progress Notes (Signed)
 Specialty Pharmacy Refill Coordination Note  [name] is a 38 y.o. female assessed today regarding refills of clinic administered specialty medication(s) Cabotegravir  & Rilpivirine  (CABENUVA )   Clinic requested Courier to Provider Office   Delivery date: 03/09/24   Verified address: [address] [place] [address]   Medication will be filled on 03/08/24.

## 2024-03-08 ENCOUNTER — Other Ambulatory Visit: Payer: Self-pay

## 2024-03-09 ENCOUNTER — Ambulatory Visit

## 2024-03-09 ENCOUNTER — Telehealth: Payer: Self-pay

## 2024-03-09 DIAGNOSIS — M542 Cervicalgia: Secondary | ICD-10-CM | POA: Diagnosis not present

## 2024-03-09 DIAGNOSIS — M546 Pain in thoracic spine: Secondary | ICD-10-CM | POA: Diagnosis not present

## 2024-03-09 DIAGNOSIS — M6281 Muscle weakness (generalized): Secondary | ICD-10-CM

## 2024-03-09 DIAGNOSIS — M79604 Pain in right leg: Secondary | ICD-10-CM | POA: Diagnosis not present

## 2024-03-09 DIAGNOSIS — M545 Low back pain, unspecified: Secondary | ICD-10-CM | POA: Diagnosis not present

## 2024-03-09 DIAGNOSIS — M79605 Pain in left leg: Secondary | ICD-10-CM | POA: Diagnosis not present

## 2024-03-09 DIAGNOSIS — R293 Abnormal posture: Secondary | ICD-10-CM | POA: Diagnosis not present

## 2024-03-09 NOTE — Progress Notes (Unsigned)
 HPI: Candice [name] is a 38 y.o. female who presents to the [facility] pharmacy clinic for Cabenuva  administration.  Patient Active Problem List   Diagnosis Date Noted   History of depression 01/13/2022   MS (multiple sclerosis) (HCC) 05/21/2020   Healthcare maintenance 02/10/2020   HIV-1 (human immunodeficiency virus I) (HCC) 09/07/2019   Genital herpes 02/16/2013   Tobacco use 03/31/2009    Patient's Medications  New Prescriptions   No medications on file  Previous Medications   ACETAMINOPHEN  (TYLENOL ) 500 MG TABLET    Take 500 mg by mouth every 6 (six) hours as needed for mild pain.   CABOTEGRAVIR  & RILPIVIRINE  ER (CABENUVA ) 600 & 900 MG/3ML INJECTION    Inject 1 kit into the muscle every 2 (two) months.   CHOLECALCIFEROL  (VITAMIN D ) 125 MCG (5000 UT) CAPS    Take 5,000 Units by mouth daily.   MELOXICAM  (MOBIC ) 15 MG TABLET    Take 1 tablet (15 mg total) by mouth as needed for pain.   SERTRALINE  (ZOLOFT ) 25 MG TABLET    Take 1 tablet (25 mg total) by mouth daily.  Modified Medications   No medications on file  Discontinued Medications   No medications on file    Allergies: Allergies  Allergen Reactions   Contrast Media [Iodinated Contrast Media] Nausea And Vomiting    Labs: Lab Results  Component Value Date   HIV1RNAQUANT <20 DETECTED (A) 01/08/2024   HIV1RNAQUANT Not Detected 07/20/2023   HIV1RNAQUANT Not Detected 05/14/2023   CD4TABS 630 07/20/2023   CD4TABS 684 09/11/2022   CD4TABS 1,026 11/22/2020    RPR and STI Lab Results  Component Value Date   LABRPR NON REACTIVE 04/23/2023   LABRPR Non Reactive 02/06/2023   LABRPR Non Reactive 09/17/2022   LABRPR NON-REACTIVE 12/13/2021   LABRPR NON-REACTIVE 06/20/2021    STI Results GC CT  04/14/2023  3:14 PM Negative  Negative   09/17/2022 10:00 AM Negative  Negative   08/29/2022  3:43 PM Negative  Negative   02/14/2022  9:13 AM Negative    Negative  Negative    Negative   12/13/2021  9:09 AM Negative     Negative  Negative    Negative   07/29/2021  4:28 PM Negative  Negative   06/20/2021  9:20 AM Negative  Negative   12/21/2020  3:45 PM Negative  Negative   11/22/2020  9:58 AM Negative  Negative   10/25/2020  9:52 AM Negative  Negative   09/05/2019  4:31 PM Negative  Negative   10/16/2017 12:00 AM Negative  Negative   06/08/2017 12:00 AM Negative  Negative   10/14/2016 12:00 AM Negative  Negative   02/15/2016 12:00 AM Negative  Negative   07/13/2015 12:00 AM Negative  Negative   04/13/2015 12:00 AM Negative  Negative   09/24/2013 12:00 AM NG: Negative  CT: Negative   02/11/2011  3:36 PM  NEGATIVE   01/29/2010  7:00 PM  NEGATIVE     Hepatitis B Lab Results  Component Value Date   HEPBSAB REACTIVE (A) 09/08/2019   HEPBSAG Negative 09/17/2022   HEPBCAB NON-REACTIVE 09/08/2019   Hepatitis C Lab Results  Component Value Date   HEPCAB NON-REACTIVE 09/08/2019   Hepatitis A Lab Results  Component Value Date   HAV BORDERLINE (A) 09/08/2019   Lipids: Lab Results  Component Value Date   CHOL 183 11/22/2020   TRIG 80 11/22/2020   HDL 51 11/22/2020   CHOLHDL 3.6 11/22/2020   VLDL 10  02/14/2013   LDLCALC 115 (H) 11/22/2020    TARGET DATE: The 25th  Assessment: Leannah presents today for her maintenance Cabenuva  injections. Past injections were tolerated well without issues. She does report more frequent nodule development in her right gluteal muscle. Last HIV RNA was undetectable in July. Will obtain HIV RNA, lipid panel and HAV antibody today. Doing well with no issues today.  Administered cabotegravir  600mg /63mL in left upper outer quadrant of the gluteal muscle. Administered rilpivirine  900 mg/3mL in the right upper outer quadrant of the gluteal muscle. No issues with injections. She will follow up in 2 months for next set of injections.  She reports she has been having fainting spells recently. She believes this is secondary to excess menstrual bleeding. She visited her  OB/GYN who prescribed iron every other day approximately one week ago. She reports a significant improvement in her fainting spells since starting iron supplementation. She requested updated iron studies today. Informed her these labs would not show improvement this early on into iron supplementation. She has follow-up scheduled with her OB/GYN on 04/07/24. Informed her they will likely get those labs at that time.  Discussed eligibility for the updated flu, Shingrix, PCV20 and HPV vaccines. She politely declines today.   Plan: - Cabenuva  injections administered - HIV RNA, lipid panel and HAV antibody collected - Next injections scheduled for 05/12/24 with Alan and 07/12/24 with Cathlyn - Call with any issues or questions  Izetta Carl, PharmD PGY1 Pharmacy Resident

## 2024-03-09 NOTE — Telephone Encounter (Signed)
 [name]  Patient's medications CABENUVA  have been couriered to [facility] from [facility] and will be administered at the patients appointment on 03/10/24.  Charmaine Sharps, CPhT Specialty Pharmacy Patient [facility] Phone: [phone] Fax:  [phone]

## 2024-03-09 NOTE — Therapy (Signed)
 OUTPATIENT PHYSICAL THERAPY TREATMENT NOTE   Patient Name: Candice [name] MRN: [id number] DOB:1985/09/21, 37 y.o., female Today's Date: 03/09/2024  END OF SESSION:  PT End of Session - 03/09/24 1623     Visit Number 9    Date for PT Re-Evaluation 04/09/24    Authorization Type BCBS, MCR    Progress Note Due on Visit 10    PT Start Time 1621    PT Stop Time 1659    PT Time Calculation (min) 38 min    Activity Tolerance Patient tolerated treatment well    Behavior During Therapy WFL for tasks assessed/performed            Past Medical History:  Diagnosis Date   Abnormal MRI, spine 04/06/2020   Alpha thalassemia silent carrier 11/28/2022   No show genetic counseling virtual visit 6/3 - reschedule if desired     FOB declined testing.      Arthralgia 02/18/2022   Back pain 10/13/2011   Complication of anesthesia    epidural with last pregnancy made entire left side numb, had to d/c   Depression    GDM (gestational diabetes mellitus) 02/11/2023   GERD (gastroesophageal reflux disease) 05/21/2020   Gestational hypertension    History of reversal of tubal ligation 03/17/2023   HIV-1 (human immunodeficiency virus I) (HCC)    HSV 06/28/2007   Major depressive disorder, recurrent episode, moderate (HCC) 06/08/2013   Migraines    Multiple sclerosis (HCC)    Right leg numbness 02/07/2020   Vitamin D  deficiency 06/20/2022   Past Surgical History:  Procedure Laterality Date   BREAST SURGERY  2019   reduction   CESAREAN SECTION     CESAREAN SECTION  09/07/2011   Procedure: CESAREAN SECTION;  Surgeon: Vonn VEAR Inch, MD;  Location: [facility];  Service: Gynecology;  Laterality: N/A;  Repeat cesarean section with delivery of baby boy at 72. Apgars 9/9.  Bilateral tubal ligation.   CESAREAN SECTION N/A 04/25/2023   Procedure: CESAREAN SECTION;  Surgeon: Nicholaus Burnard HERO, MD;  Location: [facility] ORS;  Service: Obstetrics;  Laterality: N/A;   IUD REMOVAL  2005   in cervix   TUBAL  LIGATION     tubal reanastomosis  08/13/2016   Patient Active Problem List   Diagnosis Date Noted   History of depression 01/13/2022   MS (multiple sclerosis) (HCC) 05/21/2020   Healthcare maintenance 02/10/2020   HIV-1 (human immunodeficiency virus I) (HCC) 09/07/2019   Genital herpes 02/16/2013   Tobacco use 03/31/2009    PCP: Kathrine Melena, DO  REFERRING PROVIDER: Morene Mace, DO  REFERRING DIAG: Cervical, thoracic, lumbar pain  Rationale for Evaluation and Treatment: Rehabilitation  THERAPY DIAG:  Cervical pain (neck)  Lumbar pain with radiation down both legs  Muscle weakness (generalized)  ONSET DATE: 04/25/2023 after birth of child  SUBJECTIVE:  SUBJECTIVE STATEMENT:   Patient reports that she has 1/10 pain at start of session. She states that her greatest challenge at this time is after overdoing it at work on the days that she feels good.   EVAL: Patient reports that her pain tends be in her left cervical and thoracic region. But patient reports that she can have pain in her whole body from her neck down to her lumbar spine. Also reports multiple instances per week when pain radiates from lumbar spine to bilateral LE with right LE being worse down to right foot. Reports radiating symptoms only down to left mid calf. Patient reports pain is worse with increased activity. Patient reports regular course of steroid injections with Dr. Leonce. States most recent injections were in left cervical and thoracic region. Patient reports that her left cervical and thoracic region are worst areas.   PERTINENT HISTORY:  HIV, MS, Depression  PAIN:  Are you having pain? Yes: NPRS scale: 2/10 currently. 10/10 at worst after activity Pain location: At eval addressing left cervical and  thoracic Pain description: deep throbbing/ache, numbness/tingling in right foot/leg, occasionally has sharp/stabbing pains Aggravating factors: lifting, carrying child, standing more than 20 minutes, squatting, floor transitions Relieving factors: ibuprofen , stretches, heat/hot showers  PRECAUTIONS: None  RED FLAGS: None   WEIGHT BEARING RESTRICTIONS: No  FALLS:  Has patient fallen in last 6 months? No  LIVING ENVIRONMENT: Lives with: lives with their family Lives in: House/apartment Stairs: Yes: External: 8 steps; on right going up Has following equipment at home: None  OCCUPATION: Production designer, theatre/television/film at [employer]  PLOF: Independent  PATIENT GOALS: Be able to walk and hike without pain. Get through a work day without pain  NEXT MD VISIT: Patient unsure of date  OBJECTIVE:  Note: Objective measures were completed at Evaluation unless otherwise noted.  DIAGNOSTIC FINDINGS:  Significant point tenderness of left upper trap, rhomboids, cervical paraspinals. Significant hypomobility with pain centrally C4-T5. Hypomobility with pain centrally L1-4. Point tenderness of lumbar paraspinals and right hamstring  PATIENT SURVEYS:  Modified Oswestry:  MODIFIED OSWESTRY DISABILITY SCALE  Date: 02/06/2024 Score  Pain intensity 3 =  Pain medication provides me with moderate relief from pain.  2. Personal care (washing, dressing, etc.) 1 =  I can take care of myself normally, but it increases my pain.  3. Lifting 2 = Pain prevents me from lifting heavy weights off the floor, activities (eg. sports, dancing). but I can manage if the weights are conveniently positioned (3) Pain prevents me from going out very often. (eg, on a table).  4. Walking 2 =  Pain prevents me from walking more than  mile.  5. Sitting 2 =  Pain prevents me from sitting more than 1 hour.  6. Standing 1 =  I can stand as long as I want but, it increases my pain.  7. Sleeping 0 = Pain does not prevent me from sleeping  well.  8. Social Life 2 = Pain prevents me from participating in more energetic activities (eg. sports, dancing).  9. Traveling 0 =  I can travel anywhere without increased pain.  10. Employment/ Homemaking 2 = I can perform most of my homemaking/job duties, but pain prevents me from performing more physically stressful activities (eg, lifting, vacuuming).  Total 15/50   Interpretation of scores: Score Category Description  0-20% Minimal Disability The patient can cope with most living activities. Usually no treatment is indicated apart from advice on lifting, sitting and exercise  21-40% Moderate Disability The patient experiences more pain and difficulty with sitting, lifting and standing. Travel and social life are more difficult and they may be disabled from work. Personal care, sexual activity and sleeping are not grossly affected, and the patient can usually be managed by conservative means  41-60% Severe Disability Pain remains the main problem in this group, but activities of daily living are affected. These patients require a detailed investigation  61-80% Crippled Back pain impinges on all aspects of the patient's life. Positive intervention is required  81-100% Bed-bound  These patients are either bed-bound or exaggerating their symptoms  Bluford FORBES Zoe DELENA Karon DELENA, et al. Surgery versus conservative management of stable thoracolumbar fracture: the PRESTO feasibility RCT. [place] ([place]): [employer]; 2021 Nov. [facility] Technology Assessment, No. 25.62.) Appendix 3, Oswestry Disability Index category descriptors. Available from: FindJewel[employer].cz  Minimally Clinically Important Difference (MCID) = 12.8% NDI:  NECK DISABILITY INDEX  Date: 02/06/2024 Score  Pain intensity 2 = The pain is moderate at the moment  2. Personal care (washing, dressing, etc.) 1 =  I can look after myself normally but it causes extra pain  3. Lifting 2 = Pain prevents  me lifting heavy weights off the floor, but I can manage if they are  conveniently placed, for example on a table  4. Reading 1 = I can read as much as I want to with slight pain in my neck  5. Headaches 4 = I have severe headaches, which come frequently   6. Concentration 2 = I have a fair degree of difficulty in concentrating when I want to  7. Work 3 =  I cannot do my usual work  8. Driving 1 =  I can drive my car as long as I want with slight pain in my neck  9. Sleeping 0 = I have no trouble sleeping  10. Recreation 3 = I am able to engage in a few of my usual recreation activities because of pain in   my neck  Total 19/50   Minimum Detectable Change (90% confidence): 5 points or 10% points  COGNITION: Overall cognitive status: Within functional limits for tasks assessed     SENSATION: [facility] patient has no complaints of numbness/tingling at this moment   POSTURE: rounded shoulders, forward head, and decreased lumbar lordosis  PALPATION: Significant point tenderness of left upper trap, rhomboids, cervical paraspinals. Significant hypomobility with pain centrally C4-T5. Hypomobility with pain centrally L1-4. Point tenderness of lumbar paraspinals and right hamstring  CERVICAL ROM:   Active ROM A/PROM (deg) eval  Flexion 31 P!  Extension 40 P!  Right lateral flexion 20 P!  Left lateral flexion 14  Right rotation 51  Left rotation 30 P!   (Blank rows = not tested)   LUMBAR ROM:   AROM eval  Flexion 50% P!  Extension 75% stretch  Right lateral flexion 50%  Left lateral flexion 25% P!  Right rotation WNL  Left rotation WNL   (Blank rows = not tested)  LOWER EXTREMITY MMT:    MMT Right eval Left eval  Hip flexion 4+ 4 P!  Hip extension    Hip abduction    Hip adduction    Hip internal rotation    Hip external rotation    Knee flexion 5 5  Knee extension 4+ 4  Ankle dorsiflexion    Ankle plantarflexion    Ankle inversion    Ankle eversion     (Blank rows =  not  tested)  LUMBAR SPECIAL TESTS:  Straight leg raise test: Positive  FUNCTIONAL TESTS:  Squat to floor. Increased base of support, hip ER compensation. Heels off ground. Returns to standing with use of UE on thigh  GAIT: Distance walked: 100 Assistive device utilized: None Level of assistance: Complete Independence Comments: Increased lateral trunk sway. Decreased hip extension at pre swing/terminal stance phasd  TREATMENT DATE:   [facility] Adult PT Treatment:                                                DATE: 03/09/2024   Therapeutic Exercise: Recumbent Bike - level 1 x 5 minutes  Supine EOB KTC hip flexor stretch 2x1'  Neuromuscular re-ed: Supine Pball roll up  2x10, hold 2s Supine lumbar rotation on pball 2x12 BIL  Supine bridge, calf on pball 2 x 8, hold 3s  [facility] Adult PT Treatment:                                                DATE: 03/03/2024  Therapeutic Exercise: NuStep 8' Supine EOB KTC hip flexor stretch 2x1' Neuromuscular re-ed: Supine Pball roll up  2x12, hold 2s Supine lumbar rotation on pball 2x12B Supine bridge, feet flat on pball 2x10, hold 3s   [facility] Adult PT Treatment:                                                DATE: 03/01/24 Therapeutic Exercise: Nustep L5 8 min Seated pball roll outs fwd/lat x10 ea Cybex knee extension 5# 2x10 Cybex knee flexion 20# 2x10 Neuromuscular re-ed: Lat pull down 35# x10 High/low rows 25# 2x10 ea Therapeutic Activity: Lunging to BOSU x10 LLE, x6 RLE (pain/fatigue in quads on right) Floor to stand transfer training on gym mat - use of UE to no UE, extended time required  [facility] Adult PT Treatment:                                                DATE: 02/27/24 Therapeutic Exercise: Nustep L4 8 min Seated pball roll outs fwd/lat x10 ea Neuromuscular re-ed: Lat pull down 25# 2x10 PPT with alternating marching x30 High/low rows 25# 2x10 ea Seated horizontal abduction RTB 2x10 Therapeutic Activity: STS with 10#  KB 2x10      PATIENT EDUCATION:  Education details: Discussed objective findings with patient. Discussed POC including HEP, frequency, and anatomy/physiology of present condition Person educated: Patient Education method: Explanation, Demonstration, and Handouts Education comprehension: verbalized understanding, returned demonstration, and needs further education  HOME EXERCISE PROGRAM: Access Code: [email or web address] URL: [email or web address] Date: 02/06/2024 Prepared by: Alfonse Cords Diy  Exercises - Bilateral Shoulder Flexion Wall Slide with Towel  - 2 x daily - 7 x weekly - 2 sets - 10-15 reps - Cervical Extension AROM with Strap  - 2 x daily - 7 x weekly - 2 sets - 10 reps - Cervical Retraction at Wall  - 2 x daily - 7 x  weekly - 2 sets - 10 reps - Seated Neck Sidebending Stretch  - 2 x daily - 7 x weekly - 2 sets - 15-30 seconds hold  ASSESSMENT:  CLINICAL IMPRESSION:   03/09/2024 Patient demonstrated good tolerance of today's treatment session. We will continue to progress per POC in order to reach established rehab goals.      EVAL: Patient is a 38 y.o. female who was seen today for physical therapy evaluation and treatment for cervical, thoracic, and lumbar pain with bilateral LE symptoms. Area of focus for eval was cervical and left UE due to most intense/limiting pain in these areas. She presents with significant palpable trigger points and soft tissue restrictions of left upper trap, cervical paraspinals, rhomboids, and thoracic paraspinals. Also demonstrates significant hypomobility with CPAs C4-T5. Patient with poor lifting tolerance and difficulty performing work and household chores. Patient requires skilled PT services to address deficits and return to prior level of function.   OBJECTIVE IMPAIRMENTS: decreased endurance, difficulty walking, decreased ROM, and decreased strength.   ACTIVITY LIMITATIONS: carrying, lifting, bending, standing, squatting, and  caring for others  PARTICIPATION LIMITATIONS: meal prep, driving, shopping, community activity, and occupation  PERSONAL FACTORS: Age, Past/current experiences, and Time since onset of injury/illness/exacerbation are also affecting patient's functional outcome.   REHAB POTENTIAL: Good  CLINICAL DECISION MAKING: Evolving/moderate complexity  EVALUATION COMPLEXITY: Moderate   GOALS: Goals reviewed with patient? No  SHORT TERM GOALS: Target date: 03/05/2024   Patient will be independent with HEP to improve carryover of symptoms Baseline: See above Goal status: INITIAL  2.  Patient NDI score of 14/50 to show improved tolerance to activity Baseline: 19/50 Goal status: INITIAL  3.  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  Patient will be able walk greater than 3 hours without pain to be able to perform work and recreational tasks Baseline: Pain after 20 minutes Goal status: INITIAL  3.  Patient will be able to transition from floor to standing without pain to be able to perform child care tasks Baseline: Significant pain with transitions  Goal status: INITIAL PLAN:  PT FREQUENCY: 2x/week  PT DURATION: 10 weeks  PLANNED INTERVENTIONS: 97164- PT Re-evaluation, 97750- Physical Performance Testing, 97110-Therapeutic exercises, 97530- Therapeutic activity, 97112- Neuromuscular re-education, 97535- Self Care, [id number]- Manual therapy, [phone]- Gait training, [phone]- Aquatic Therapy, Patient/Family education, Balance training, Stair training, Taping, Joint mobilization, Joint manipulation, Spinal manipulation, Spinal  mobilization, Cryotherapy, and Moist heat.  PLAN FOR NEXT SESSION:    Marko Molt, PT, DPT  03/12/2024 6:41 AM

## 2024-03-10 ENCOUNTER — Ambulatory Visit: Payer: Self-pay | Admitting: Pharmacist

## 2024-03-10 ENCOUNTER — Other Ambulatory Visit: Payer: Self-pay

## 2024-03-10 DIAGNOSIS — Z0184 Encounter for antibody response examination: Secondary | ICD-10-CM | POA: Diagnosis not present

## 2024-03-10 DIAGNOSIS — B20 Human immunodeficiency virus [HIV] disease: Secondary | ICD-10-CM

## 2024-03-10 DIAGNOSIS — Z0000 Encounter for general adult medical examination without abnormal findings: Secondary | ICD-10-CM

## 2024-03-10 MED ORDER — CABOTEGRAVIR & RILPIVIRINE ER 600 & 900 MG/3ML IM SUER
1.0000 | Freq: Once | INTRAMUSCULAR | Status: AC
  Administered 2024-03-10: 1 via INTRAMUSCULAR

## 2024-03-12 LAB — HIV-1 RNA QUANT-NO REFLEX-BLD
HIV 1 RNA Quant: NOT DETECTED {copies}/mL
HIV-1 RNA Quant, Log: NOT DETECTED {Log_copies}/mL

## 2024-03-12 LAB — LIPID PANEL
Cholesterol: 201 mg/dL — ABNORMAL HIGH (ref <200)
HDL: 52 mg/dL (ref >=50)
LDL Cholesterol (Calc): 134 mg/dL — ABNORMAL HIGH
Non-HDL Cholesterol (Calc): 149 mg/dL — ABNORMAL HIGH (ref <130)
Total CHOL/HDL Ratio: 3.9 (calc) (ref <5.0)
Triglycerides: 60 mg/dL (ref <150)

## 2024-03-12 LAB — HEPATITIS A ANTIBODY, TOTAL: Hepatitis A AB,Total: REACTIVE — AB

## 2024-03-16 ENCOUNTER — Ambulatory Visit

## 2024-03-16 NOTE — Progress Notes (Unsigned)
 [name] [address] [place] [address] Phone: [phone]   Assessment and Plan:     1. Neck pain (Primary) 2. Chronic bilateral low back pain without sciatica 3. Somatic dysfunction of cervical region 4. Somatic dysfunction of thoracic region 5. Somatic dysfunction of lumbar region 6. Somatic dysfunction of pelvic region 7. Somatic dysfunction of rib region  -Chronic with exacerbation, subsequent visit -Overall improvement in flare of pain after trigger point injections at previous office visit.  Continued tightness in low back and upper back most consistent with muscular dysfunction. - Use meloxicam  15 mg daily as needed for breakthrough pain.  Recommend limiting chronic NSAIDs to 1-2 doses per week to prevent long-term side effects. Use Tylenol  500 to 1000 mg tablets 2-3 times a day as needed for day-to-day pain relief.    - Continue physical therapy - Patient has received relief with OMT in the past.  Elects for repeat OMT today.  Tolerated well per note below. - Decision today to treat with OMT was based on Physical Exam   After verbal consent patient was treated with HVLA (high velocity low amplitude), ME (muscle energy), FPR (flex positional release), ST (soft tissue), PC/PD (Pelvic Compression/ Pelvic Decompression) techniques in cervical, rib, thoracic, lumbar, and pelvic areas. Patient tolerated the procedure well with improvement in symptoms.  Patient educated on potential side effects of soreness and recommended to rest, hydrate, and use Tylenol  as needed for pain control.   Pertinent previous records reviewed include none  Follow Up: 4 weeks for reevaluation.  Could consider repeat OMT versus trigger point injections.  Last cortisone trigger point injections performed on 01/21/2024   Subjective:   I, Candice [name], am serving as a Neurosurgeon for Doctor Candice [name]  Chief Complaint: thoracic back pain    HPI:     02/17/2022 Patient is a 38 year old female complaining of thoracic back pain. Patient states  Beginning around 2018, she began having intermittent episodes of numbness, weakness and dizziness.  In May or June of that year, she began having a recurrence of symptoms, including right flank pain and numbness radiating down her right leg with some associated right leg weakness.  She felt off balance.  She saw neurology at that time.  MRI of thoracic spine revealed subtle enhancing hyperintense focus at the T8-T9 region.would like to discuss OMT    03/10/2022 Patient states that she is alright, left side back is still tight and shoulders    04/07/2022 Patient states that she is okay left side is still crazy even with PT   05/05/2022 Patient states she is good needs a tune up   06/02/2022 Patient states that she is pretty good    06/30/2022 Patient states left side is a problem , PT told her to re check rotator cuff   07/22/2022 Patient states she is pretty good, CSI was a life saver    09/09/2022 Patient states that she has been having intermittent flares    10/07/2022 Patient states that she would still like to get OMT while pregnant    11/04/2022 Patient states left side is  jacked up again    11/27/2022 Patient states shoulder neck and regular adjustment    01/02/2023 Patient states back is flared , but other than that pretty good    01/30/2023 Patient states left side is starting to act up and spasm    02/09/2023 Patient states that her shoulder pain has started coming back  in the last 2-3 weeks and it is burning.   02/27/2023 Patient states that she is decent. Left sided neck tightness    09/16/2023 Patient states wants to discuss CSI options. And ready for an adjustment    10/22/2023 Patient states shots helped. Low back and neck are tight    11/19/2023 Patient states left side low back is flared    12/24/2023 Patient states ok but hurting a little bit. The whole back and the right  arm. Right arm has been swollen, elbow has been hurting, and she has been wearing a brace. Some swelling in the right hand as well. No injury to the arm. 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    01/21/2024 Patient states she is feeling some better    02/18/2024 Patient states she is decent. Left side is flared   03/17/2024 Patient states  she a little tight right now    Relevant Historical Information: Multiple sclerosis  Additional pertinent review of systems negative.  Current Outpatient Medications  Medication Sig Dispense Refill   acetaminophen  (TYLENOL ) 500 MG tablet Take 500 mg by mouth every 6 (six) hours as needed for mild pain.     cabotegravir  & rilpivirine  ER (CABENUVA ) 600 & 900 MG/3ML injection Inject 1 kit into the muscle every 2 (two) months. 6 mL 5   Cholecalciferol  (VITAMIN D ) 125 MCG (5000 UT) CAPS Take 5,000 Units by mouth daily.     meloxicam  (MOBIC ) 15 MG tablet Take 1 tablet (15 mg total) by mouth as needed for pain. 30 tablet 0   sertraline  (ZOLOFT ) 25 MG tablet Take 1 tablet (25 mg total) by mouth daily. 30 tablet 2   No current facility-administered medications for this visit.      Objective:     Vitals:   03/17/24 1017  BP: 126/78  Weight: 216 lb (98 kg)  Height: 5' 2 (1.575 m)      Body mass index is 39.51 kg/m.    Physical Exam:     General: Well-appearing, cooperative, sitting comfortably in no acute distress.   OMT Physical Exam:  ASIS Compression Test: Positive Right Cervical: TTP paraspinal, C3 RL SR Rib: Bilateral elevated first rib with NTTP Thoracic: TTP paraspinal, T6 RRSR Lumbar: TTP paraspinal, L2 RLSL Pelvis: Right anterior innominate  Electronically signed by:  Candice [name] 11:23 AM 03/17/24

## 2024-03-17 ENCOUNTER — Ambulatory Visit: Admitting: Sports Medicine

## 2024-03-17 VITALS — BP 126/78 | Ht 62.0 in | Wt 216.0 lb

## 2024-03-17 DIAGNOSIS — M545 Low back pain, unspecified: Secondary | ICD-10-CM | POA: Diagnosis not present

## 2024-03-17 DIAGNOSIS — M9901 Segmental and somatic dysfunction of cervical region: Secondary | ICD-10-CM

## 2024-03-17 DIAGNOSIS — M542 Cervicalgia: Secondary | ICD-10-CM

## 2024-03-17 DIAGNOSIS — G8929 Other chronic pain: Secondary | ICD-10-CM

## 2024-03-17 DIAGNOSIS — M9908 Segmental and somatic dysfunction of rib cage: Secondary | ICD-10-CM

## 2024-03-17 DIAGNOSIS — M9903 Segmental and somatic dysfunction of lumbar region: Secondary | ICD-10-CM

## 2024-03-17 DIAGNOSIS — M9902 Segmental and somatic dysfunction of thoracic region: Secondary | ICD-10-CM

## 2024-03-17 DIAGNOSIS — M9905 Segmental and somatic dysfunction of pelvic region: Secondary | ICD-10-CM | POA: Diagnosis not present

## 2024-03-22 ENCOUNTER — Encounter

## 2024-03-24 ENCOUNTER — Ambulatory Visit: Attending: Sports Medicine

## 2024-03-24 DIAGNOSIS — M542 Cervicalgia: Secondary | ICD-10-CM | POA: Insufficient documentation

## 2024-03-24 DIAGNOSIS — M79605 Pain in left leg: Secondary | ICD-10-CM | POA: Insufficient documentation

## 2024-03-24 DIAGNOSIS — M545 Low back pain, unspecified: Secondary | ICD-10-CM | POA: Diagnosis not present

## 2024-03-24 DIAGNOSIS — M79604 Pain in right leg: Secondary | ICD-10-CM | POA: Diagnosis not present

## 2024-03-24 DIAGNOSIS — M6281 Muscle weakness (generalized): Secondary | ICD-10-CM | POA: Insufficient documentation

## 2024-03-24 NOTE — Therapy (Signed)
 OUTPATIENT PHYSICAL THERAPY TREATMENT NOTE   Patient Name: Candice [name] MRN: [id number] DOB:[birth year], 38 y.o., female Today's Date: 03/24/2024  END OF SESSION:  PT End of Session - 03/24/24 1619     Visit Number 10    Date for Recertification  04/09/24    Authorization Type BCBS, [facility]    PT Start Time 1619    PT Stop Time 1657    PT Time Calculation (min) 38 min    Activity Tolerance Patient tolerated treatment well    Behavior During Therapy [facility] for tasks assessed/performed             Past Medical History:  Diagnosis Date   Abnormal MRI, spine 04/06/2020   Alpha thalassemia silent carrier 11/28/2022   No show genetic counseling virtual visit 6/3 - reschedule if desired     FOB declined testing.      Arthralgia 02/18/2022   Back pain 10/13/2011   Complication of anesthesia    epidural with last pregnancy made entire left side numb, had to d/c   Depression    GDM (gestational diabetes mellitus) 02/11/2023   GERD (gastroesophageal reflux disease) 05/21/2020   Gestational hypertension    History of reversal of tubal ligation 03/17/2023   HIV-1 (human immunodeficiency virus I) (HCC)    HSV 06/28/2007   Major depressive disorder, recurrent episode, moderate (HCC) 06/08/2013   Migraines    Multiple sclerosis    Right leg numbness 02/07/2020   Vitamin D  deficiency 06/20/2022   Past Surgical History:  Procedure Laterality Date   BREAST SURGERY  2019   reduction   CESAREAN SECTION     CESAREAN SECTION  09/07/2011   Procedure: CESAREAN SECTION;  Surgeon: Vonn VEAR Inch, MD;  Location: [facility] ORS;  Service: Gynecology;  Laterality: N/A;  Repeat cesarean section with delivery of baby boy at 66. Apgars 9/9.  Bilateral tubal ligation.   CESAREAN SECTION N/A 04/25/2023   Procedure: CESAREAN SECTION;  Surgeon: Nicholaus Burnard HERO, MD;  Location: [facility] ORS;  Service: Obstetrics;  Laterality: N/A;   IUD REMOVAL  2005   in cervix   TUBAL LIGATION     tubal reanastomosis   08/13/2016   Patient Active Problem List   Diagnosis Date Noted   History of depression 01/13/2022   MS (multiple sclerosis) 05/21/2020   Healthcare maintenance 02/10/2020   HIV-1 (human immunodeficiency virus I) (HCC) 09/07/2019   Genital herpes 02/16/2013   Tobacco use 03/31/2009    PCP: Kathrine Melena, DO  REFERRING PROVIDER: Morene Mace, DO  REFERRING DIAG: Cervical, thoracic, lumbar pain  Rationale for Evaluation and Treatment: Rehabilitation  THERAPY DIAG:  Cervical pain (neck)  Lumbar pain with radiation down both legs  Muscle weakness (generalized)  ONSET DATE: 04/25/2023 after birth of child  SUBJECTIVE:  SUBJECTIVE STATEMENT:   Patient reports to PT with 3/10 pain after her workday. \   EVAL: Patient reports that her pain tends be in her left cervical and thoracic region. But patient reports that she can have pain in her whole body from her neck down to her lumbar spine. Also reports multiple instances per week when pain radiates from lumbar spine to bilateral LE with right LE being worse down to right foot. Reports radiating symptoms only down to left mid calf. Patient reports pain is worse with increased activity. Patient reports regular course of steroid injections with Dr. Leonce. States most recent injections were in left cervical and thoracic region. Patient reports that her left cervical and thoracic region are worst areas.   PERTINENT HISTORY:  HIV, MS, Depression  PAIN:  Are you having pain? Yes: NPRS scale: 2/10 currently. 10/10 at worst after activity Pain location: At eval addressing left cervical and thoracic Pain description: deep throbbing/ache, numbness/tingling in right foot/leg, occasionally has sharp/stabbing pains Aggravating factors: lifting, carrying child,  standing more than 20 minutes, squatting, floor transitions Relieving factors: ibuprofen , stretches, heat/hot showers  PRECAUTIONS: None  RED FLAGS: None   WEIGHT BEARING RESTRICTIONS: No  FALLS:  Has patient fallen in last 6 months? No  LIVING ENVIRONMENT: Lives with: lives with their family Lives in: House/apartment Stairs: Yes: External: 8 steps; on right going up Has following equipment at home: None  OCCUPATION: Production designer, theatre/television/film at [employer]  PLOF: Independent  PATIENT GOALS: Be able to walk and hike without pain. Get through a work day without pain  NEXT MD VISIT: Patient unsure of date  OBJECTIVE:  Note: Objective measures were completed at Evaluation unless otherwise noted.  DIAGNOSTIC FINDINGS:  Significant point tenderness of left upper trap, rhomboids, cervical paraspinals. Significant hypomobility with pain centrally C4-T5. Hypomobility with pain centrally L1-4. Point tenderness of lumbar paraspinals and right hamstring  PATIENT SURVEYS:  Modified Oswestry:  MODIFIED OSWESTRY DISABILITY SCALE  Date: 02/06/2024 Score  Pain intensity 3 =  Pain medication provides me with moderate relief from pain.  2. Personal care (washing, dressing, etc.) 1 =  I can take care of myself normally, but it increases my pain.  3. Lifting 2 = Pain prevents me from lifting heavy weights off the floor, activities (eg. sports, dancing). but I can manage if the weights are conveniently positioned (3) Pain prevents me from going out very often. (eg, on a table).  4. Walking 2 =  Pain prevents me from walking more than  mile.  5. Sitting 2 =  Pain prevents me from sitting more than 1 hour.  6. Standing 1 =  I can stand as long as I want but, it increases my pain.  7. Sleeping 0 = Pain does not prevent me from sleeping well.  8. Social Life 2 = Pain prevents me from participating in more energetic activities (eg. sports, dancing).  9. Traveling 0 =  I can travel anywhere without  increased pain.  10. Employment/ Homemaking 2 = I can perform most of my homemaking/job duties, but pain prevents me from performing more physically stressful activities (eg, lifting, vacuuming).  Total 15/50   Interpretation of scores: Score Category Description  0-20% Minimal Disability The patient can cope with most living activities. Usually no treatment is indicated apart from advice on lifting, sitting and exercise  21-40% Moderate Disability The patient experiences more pain and difficulty with sitting, lifting and standing. Travel and social life are more difficult  and they may be disabled from work. Personal care, sexual activity and sleeping are not grossly affected, and the patient can usually be managed by conservative means  41-60% Severe Disability Pain remains the main problem in this group, but activities of daily living are affected. These patients require a detailed investigation  61-80% Crippled Back pain impinges on all aspects of the patient's life. Positive intervention is required  81-100% Bed-bound  These patients are either bed-bound or exaggerating their symptoms  Bluford FORBES Zoe DELENA Karon DELENA, et al. Surgery versus conservative management of stable thoracolumbar fracture: the PRESTO feasibility RCT. [place]): [employer]; 2021 Nov. [facility] Technology Assessment, No. 25.62.) Appendix 3, Oswestry Disability Index category descriptors. Available from: FindJewel[employer].cz  Minimally Clinically Important Difference (MCID) = 12.8% NDI:  NECK DISABILITY INDEX  Date: 02/06/2024 Score  Pain intensity 2 = The pain is moderate at the moment  2. Personal care (washing, dressing, etc.) 1 =  I can look after myself normally but it causes extra pain  3. Lifting 2 = Pain prevents me lifting heavy weights off the floor, but I can manage if they are  conveniently placed, for example on a table  4. Reading 1 = I can read as much as I want to  with slight pain in my neck  5. Headaches 4 = I have severe headaches, which come frequently   6. Concentration 2 = I have a fair degree of difficulty in concentrating when I want to  7. Work 3 =  I cannot do my usual work  8. Driving 1 =  I can drive my car as long as I want with slight pain in my neck  9. Sleeping 0 = I have no trouble sleeping  10. Recreation 3 = I am able to engage in a few of my usual recreation activities because of pain in   my neck  Total 19/50   Minimum Detectable Change (90% confidence): 5 points or 10% points  COGNITION: Overall cognitive status: Within functional limits for tasks assessed     SENSATION: [facility] patient has no complaints of numbness/tingling at this moment   POSTURE: rounded shoulders, forward head, and decreased lumbar lordosis  PALPATION: Significant point tenderness of left upper trap, rhomboids, cervical paraspinals. Significant hypomobility with pain centrally C4-T5. Hypomobility with pain centrally L1-4. Point tenderness of lumbar paraspinals and right hamstring  CERVICAL ROM:   Active ROM A/PROM (deg) eval  Flexion 31 P!  Extension 40 P!  Right lateral flexion 20 P!  Left lateral flexion 14  Right rotation 51  Left rotation 30 P!   (Blank rows = not tested)   LUMBAR ROM:   AROM eval  Flexion 50% P!  Extension 75% stretch  Right lateral flexion 50%  Left lateral flexion 25% P!  Right rotation WNL  Left rotation WNL   (Blank rows = not tested)  LOWER EXTREMITY MMT:    MMT Right eval Left eval  Hip flexion 4+ 4 P!  Hip extension    Hip abduction    Hip adduction    Hip internal rotation    Hip external rotation    Knee flexion 5 5  Knee extension 4+ 4  Ankle dorsiflexion    Ankle plantarflexion    Ankle inversion    Ankle eversion     (Blank rows = not tested)  LUMBAR SPECIAL TESTS:  Straight leg raise test: Positive  FUNCTIONAL TESTS:  Squat to floor. Increased base of support, hip  ER compensation.  Heels off ground. Returns to standing with use of UE on thigh  GAIT: Distance walked: 100 Assistive device utilized: None Level of assistance: Complete Independence Comments: Increased lateral trunk sway. Decreased hip extension at pre swing/terminal stance phasd  TREATMENT DATE:   [facility] Adult PT Treatment:                                                DATE: 03/24/2024  Therapeutic Exercise: Recumbent Bike - level 1 x 5 minutes  Cybex knee extension 5# 2x10 Cybex knee flexion 25# 2x15  Neuromuscular re-ed: Lat pull down 35# x15 High/low rows 25# 2x10 ea Supine Pball roll up -- skip d/t reported dizziness Supine bridge, calf on pball -- 2 x 8, hold 3s  Therapeutic Activity:  Reviewed STG, LTG and discussion plan for remaining visit     PATIENT EDUCATION:  Education details: Discussed objective findings with patient. Discussed POC including HEP, frequency, and anatomy/physiology of present condition Person educated: Patient Education method: Explanation, Demonstration, and Handouts Education comprehension: verbalized understanding, returned demonstration, and needs further education  HOME EXERCISE PROGRAM: Access Code: [email or web address] URL: [email or web address] Date: 02/06/2024 Prepared by: Alfonse Cords Diy  Exercises - Bilateral Shoulder Flexion Wall Slide with Towel  - 2 x daily - 7 x weekly - 2 sets - 10-15 reps - Cervical Extension AROM with Strap  - 2 x daily - 7 x weekly - 2 sets - 10 reps - Cervical Retraction at Wall  - 2 x daily - 7 x weekly - 2 sets - 10 reps - Seated Neck Sidebending Stretch  - 2 x daily - 7 x weekly - 2 sets - 15-30 seconds hold  ASSESSMENT:  CLINICAL IMPRESSION:   03/24/2024 Despite general fatigue at start of visit, patient demonstrated good tolerance of today's treatment session. We will continue to progress per POC in order to reach established rehab goals.  Plan is to address remaining goals at next visit and updated HEP as  indicated to improve maintenance following d/c.    EVAL: Patient is a 38 y.o. female who was seen today for physical therapy evaluation and treatment for cervical, thoracic, and lumbar pain with bilateral LE symptoms. Area of focus for eval was cervical and left UE due to most intense/limiting pain in these areas. She presents with significant palpable trigger points and soft tissue restrictions of left upper trap, cervical paraspinals, rhomboids, and thoracic paraspinals. Also demonstrates significant hypomobility with CPAs C4-T5. Patient with poor lifting tolerance and difficulty performing work and household chores. Patient requires skilled PT services to address deficits and return to prior level of function.   OBJECTIVE IMPAIRMENTS: decreased endurance, difficulty walking, decreased ROM, and decreased strength.   ACTIVITY LIMITATIONS: carrying, lifting, bending, standing, squatting, and caring for others  PARTICIPATION LIMITATIONS: meal prep, driving, shopping, community activity, and occupation  PERSONAL FACTORS: Age, Past/current experiences, and Time since onset of injury/illness/exacerbation are also affecting patient's functional outcome.   REHAB POTENTIAL: Good  CLINICAL DECISION MAKING: Evolving/moderate complexity  EVALUATION COMPLEXITY: Moderate   GOALS: Goals reviewed with patient? No  SHORT TERM GOALS: Target date: 03/05/2024   Patient will be independent with HEP to improve carryover of symptoms Baseline: See above Goal status: MET  2.  Patient NDI score of 14/50 to show improved tolerance to activity Baseline: 19/50 03/24/24: 13/50 Goal status: INITIAL  3.  Patient will be able to walk and stand at least 45 minutes without pain to perform work and recreational tasks Baseline: Limited to 20 minutes before increase in pain Goal status: MET per patient report 03/24/24   LONG TERM GOALS: Target date: 04/02/2024   Patient will be able to lift 30 lbs from floor to  be able to perform work tasks at [employer] Baseline: Pain with lifting and poor squatting mechanics 03/24/2024: able to lift 20lb comfortably  Goal status: PROGRESSING  2.  Patient will be able walk greater than 3 hours without pain to be able to perform work and recreational tasks Baseline: Pain after 20 minutes 03/25/2023: 2-2.5 hours comfortable  Goal status: PROGRESSING  3.  Patient will be able to transition from floor to standing without pain to be able to perform child care tasks Baseline: Significant pain with transitions  03/24/2024: able  Goal status: MET  PLAN:  PT FREQUENCY: 2x/week  PT DURATION: 10 weeks  PLANNED INTERVENTIONS: 97164- PT Re-evaluation, 97750- Physical Performance Testing, 97110-Therapeutic exercises, 97530- Therapeutic activity, 97112- Neuromuscular re-education, 97535- Self Care, [id number]- Manual therapy, [id number]- Gait training, [phone]- Aquatic Therapy, Patient/Family education, Balance training, Stair training, Taping, Joint mobilization, Joint manipulation, Spinal manipulation, Spinal mobilization, Cryotherapy, and Moist heat.  PLAN FOR NEXT SESSION:    Marko Molt, PT, DPT  03/24/2024 5:01 PM

## 2024-03-30 ENCOUNTER — Ambulatory Visit

## 2024-03-30 DIAGNOSIS — M6281 Muscle weakness (generalized): Secondary | ICD-10-CM | POA: Diagnosis not present

## 2024-03-30 DIAGNOSIS — M79605 Pain in left leg: Secondary | ICD-10-CM

## 2024-03-30 DIAGNOSIS — M545 Low back pain, unspecified: Secondary | ICD-10-CM | POA: Diagnosis not present

## 2024-03-30 DIAGNOSIS — M79604 Pain in right leg: Secondary | ICD-10-CM | POA: Diagnosis not present

## 2024-03-30 DIAGNOSIS — M542 Cervicalgia: Secondary | ICD-10-CM

## 2024-03-30 NOTE — Therapy (Signed)
 OUTPATIENT PHYSICAL THERAPY NOTE DISCHARGE SUMMARY   Patient Name: Candice [name] MRN: [id number] DOB:02-22-1986, 38 y.o., female Today's Date: 03/30/2024  PHYSICAL THERAPY DISCHARGE SUMMARY  Visits from Start of Care: 11   Current functional level related to goals / functional outcomes: See objective findings/assessment    Remaining deficits: See objective findings/assessment    Education / Equipment: See today's treatment/assessment      Patient agrees to discharge. Patient goals were met. Patient is being discharged due to meeting the stated rehab goals.    END OF SESSION:  PT End of Session - 03/30/24 1458     Visit Number 11    Date for Recertification  04/09/24    Authorization Type BCBS, MCR    Progress Note Due on Visit 10    PT Start Time 1450    PT Stop Time 1503    PT Time Calculation (min) 13 min    Activity Tolerance Patient tolerated treatment well    Behavior During Therapy WFL for tasks assessed/performed              Past Medical History:  Diagnosis Date   Abnormal MRI, spine 04/06/2020   Alpha thalassemia silent carrier 11/28/2022   No show genetic counseling virtual visit 6/3 - reschedule if desired     FOB declined testing.      Arthralgia 02/18/2022   Back pain 10/13/2011   Complication of anesthesia    epidural with last pregnancy made entire left side numb, had to d/c   Depression    GDM (gestational diabetes mellitus) 02/11/2023   GERD (gastroesophageal reflux disease) 05/21/2020   Gestational hypertension    History of reversal of tubal ligation 03/17/2023   HIV-1 (human immunodeficiency virus I) (HCC)    HSV 06/28/2007   Major depressive disorder, recurrent episode, moderate (HCC) 06/08/2013   Migraines    Multiple sclerosis    Right leg numbness 02/07/2020   Vitamin D  deficiency 06/20/2022   Past Surgical History:  Procedure Laterality Date   BREAST SURGERY  2019   reduction   CESAREAN SECTION     CESAREAN  SECTION  09/07/2011   Procedure: CESAREAN SECTION;  Surgeon: Vonn VEAR Inch, MD;  Location: [facility];  Service: Gynecology;  Laterality: N/A;  Repeat cesarean section with delivery of baby boy at 38. Apgars 9/9.  Bilateral tubal ligation.   CESAREAN SECTION N/A 04/25/2023   Procedure: CESAREAN SECTION;  Surgeon: Nicholaus Burnard HERO, MD;  Location: [facility] LD ORS;  Service: Obstetrics;  Laterality: N/A;   IUD REMOVAL  2005   in cervix   TUBAL LIGATION     tubal reanastomosis  08/13/2016   Patient Active Problem List   Diagnosis Date Noted   History of depression 01/13/2022   MS (multiple sclerosis) 05/21/2020   Healthcare maintenance 02/10/2020   HIV-1 (human immunodeficiency virus I) (HCC) 09/07/2019   Genital herpes 02/16/2013   Tobacco use 03/31/2009    PCP: Kathrine Melena, DO  REFERRING PROVIDER: Morene Mace, DO  REFERRING DIAG: Cervical, thoracic, lumbar pain  Rationale for Evaluation and Treatment: Rehabilitation  THERAPY DIAG:  Cervical pain (neck)  Lumbar pain with radiation down both legs  Muscle weakness (generalized)  ONSET DATE: 04/25/2023 after birth of child  SUBJECTIVE:  SUBJECTIVE STATEMENT:   Patient reporting significant improvement with skilled PT. She reports that her LBP has improved from 8-9/10 at eval to 2-3/10 with daily tasks. She reports that she is able to perform the majority of her work activities without a lot of pain. She feels comfortable with d/c from PT today. She will continue with HEP.   EVAL: Patient reports that her pain tends be in her left cervical and thoracic region. But patient reports that she can have pain in her whole body from her neck down to her lumbar spine. Also reports multiple instances per week when pain radiates from lumbar spine to bilateral LE with  right LE being worse down to right foot. Reports radiating symptoms only down to left mid calf. Patient reports pain is worse with increased activity. Patient reports regular course of steroid injections with Dr. Leonce. States most recent injections were in left cervical and thoracic region. Patient reports that her left cervical and thoracic region are worst areas.   PERTINENT HISTORY:  HIV, MS, Depression  PAIN:  Are you having pain? Yes: NPRS scale: 2/10 currently. 10/10 at worst after activity Pain location: At eval addressing left cervical and thoracic Pain description: deep throbbing/ache, numbness/tingling in right foot/leg, occasionally has sharp/stabbing pains Aggravating factors: lifting, carrying child, standing more than 20 minutes, squatting, floor transitions Relieving factors: ibuprofen , stretches, heat/hot showers  PRECAUTIONS: None  RED FLAGS: None   WEIGHT BEARING RESTRICTIONS: No  FALLS:  Has patient fallen in last 6 months? No  LIVING ENVIRONMENT: Lives with: lives with their family Lives in: House/apartment Stairs: Yes: External: 8 steps; on right going up Has following equipment at home: None  OCCUPATION: Production designer, theatre/television/film at [employer]  PLOF: Independent  PATIENT GOALS: Be able to walk and hike without pain. Get through a work day without pain  NEXT MD VISIT: Patient unsure of date  OBJECTIVE:  Note: Objective measures were completed at Evaluation unless otherwise noted.  DIAGNOSTIC FINDINGS:  Significant point tenderness of left upper trap, rhomboids, cervical paraspinals. Significant hypomobility with pain centrally C4-T5. Hypomobility with pain centrally L1-4. Point tenderness of lumbar paraspinals and right hamstring  PATIENT SURVEYS:  Modified Oswestry:  MODIFIED OSWESTRY DISABILITY SCALE  Date: 02/06/2024 Score  Pain intensity 3 =  Pain medication provides me with moderate relief from pain.  2. Personal care (washing, dressing, etc.) 1 =   I can take care of myself normally, but it increases my pain.  3. Lifting 2 = Pain prevents me from lifting heavy weights off the floor, activities (eg. sports, dancing). but I can manage if the weights are conveniently positioned (3) Pain prevents me from going out very often. (eg, on a table).  4. Walking 2 =  Pain prevents me from walking more than  mile.  5. Sitting 2 =  Pain prevents me from sitting more than 1 hour.  6. Standing 1 =  I can stand as long as I want but, it increases my pain.  7. Sleeping 0 = Pain does not prevent me from sleeping well.  8. Social Life 2 = Pain prevents me from participating in more energetic activities (eg. sports, dancing).  9. Traveling 0 =  I can travel anywhere without increased pain.  10. Employment/ Homemaking 2 = I can perform most of my homemaking/job duties, but pain prevents me from performing more physically stressful activities (eg, lifting, vacuuming).  Total 15/50   Interpretation of scores: Score Category Description  0-20% Minimal Disability  The patient can cope with most living activities. Usually no treatment is indicated apart from advice on lifting, sitting and exercise  21-40% Moderate Disability The patient experiences more pain and difficulty with sitting, lifting and standing. Travel and social life are more difficult and they may be disabled from work. Personal care, sexual activity and sleeping are not grossly affected, and the patient can usually be managed by conservative means  41-60% Severe Disability Pain remains the main problem in this group, but activities of daily living are affected. These patients require a detailed investigation  61-80% Crippled Back pain impinges on all aspects of the patient's life. Positive intervention is required  81-100% Bed-bound  These patients are either bed-bound or exaggerating their symptoms  Bluford FORBES Zoe DELENA Karon DELENA, et al. Surgery versus conservative management of stable thoracolumbar  fracture: the PRESTO feasibility RCT. [place]): [employer]; 2021 Nov. [facility] Technology Assessment, No. 25.62.) Appendix 3, Oswestry Disability Index category descriptors. Available from: FindJewelers.cz  Minimally Clinically Important Difference (MCID) = 12.8% NDI:  NECK DISABILITY INDEX  Date: 02/06/2024 Score  Pain intensity 2 = The pain is moderate at the moment  2. Personal care (washing, dressing, etc.) 1 =  I can look after myself normally but it causes extra pain  3. Lifting 2 = Pain prevents me lifting heavy weights off the floor, but I can manage if they are  conveniently placed, for example on a table  4. Reading 1 = I can read as much as I want to with slight pain in my neck  5. Headaches 4 = I have severe headaches, which come frequently   6. Concentration 2 = I have a fair degree of difficulty in concentrating when I want to  7. Work 3 =  I cannot do my usual work  8. Driving 1 =  I can drive my car as long as I want with slight pain in my neck  9. Sleeping 0 = I have no trouble sleeping  10. Recreation 3 = I am able to engage in a few of my usual recreation activities because of pain in   my neck  Total 19/50   Minimum Detectable Change (90% confidence): 5 points or 10% points  COGNITION: Overall cognitive status: Within functional limits for tasks assessed     SENSATION: [facility] patient has no complaints of numbness/tingling at this moment   POSTURE: rounded shoulders, forward head, and decreased lumbar lordosis  PALPATION: Significant point tenderness of left upper trap, rhomboids, cervical paraspinals. Significant hypomobility with pain centrally C4-T5. Hypomobility with pain centrally L1-4. Point tenderness of lumbar paraspinals and right hamstring  CERVICAL ROM:   Active ROM A/PROM (deg) eval  Flexion 31 P!  Extension 40 P!  Right lateral flexion 20 P!  Left lateral flexion 14  Right rotation 51  Left rotation  30 P!   (Blank rows = not tested)   LUMBAR ROM:   AROM eval  Flexion 50% P!  Extension 75% stretch  Right lateral flexion 50%  Left lateral flexion 25% P!  Right rotation WNL  Left rotation WNL   (Blank rows = not tested)  LOWER EXTREMITY MMT:    MMT Right eval Left eval  Hip flexion 4+ 4 P!  Hip extension    Hip abduction    Hip adduction    Hip internal rotation    Hip external rotation    Knee flexion 5 5  Knee extension 4+ 4  Ankle dorsiflexion  Ankle plantarflexion    Ankle inversion    Ankle eversion     (Blank rows = not tested)  LUMBAR SPECIAL TESTS:  Straight leg raise test: Positive  FUNCTIONAL TESTS:  Squat to floor. Increased base of support, hip ER compensation. Heels off ground. Returns to standing with use of UE on thigh  GAIT: Distance walked: 100 Assistive device utilized: None Level of assistance: Complete Independence Comments: Increased lateral trunk sway. Decreased hip extension at pre swing/terminal stance phasd  TREATMENT DATE:   [facility] Adult PT Treatment:                                                DATE: 03/29/2024  Therapeutic Activity:  Reassessment of objective measures and subjective assessment regarding progress towards established goals and plan for independence with prescribed home program following discharged from PT      PATIENT EDUCATION:  Education details: Discussed objective findings with patient. Discussed POC including HEP, frequency, and anatomy/physiology of present condition Person educated: Patient Education method: Explanation, Demonstration, and Handouts Education comprehension: verbalized understanding, returned demonstration, and needs further education  HOME EXERCISE PROGRAM: Access Code: [email or web address] URL: [email or web address] Date: 02/06/2024 Prepared by: Alfonse Cords Diy  Exercises - Bilateral Shoulder Flexion Wall Slide with Towel  - 2 x daily - 7 x weekly - 2 sets - 10-15 reps -  Cervical Extension AROM with Strap  - 2 x daily - 7 x weekly - 2 sets - 10 reps - Cervical Retraction at Wall  - 2 x daily - 7 x weekly - 2 sets - 10 reps - Seated Neck Sidebending Stretch  - 2 x daily - 7 x weekly - 2 sets - 15-30 seconds hold  ASSESSMENT:  CLINICAL IMPRESSION:   03/30/2024 Candice [name] has attended 10 visits since initial evaluation. Patient has made good progress with standing tolerance, lifting mechanics and tolerance, and performing floor transfers.She has met all rehab goals and agrees to discharge from skilled PT at this time. She should continue with prescribed home program. Patient will be discharged from skilled PT at this time and should follow up with referring provider as needed.     EVAL: Patient is a 38 y.o. female who was seen today for physical therapy evaluation and treatment for cervical, thoracic, and lumbar pain with bilateral LE symptoms. Area of focus for eval was cervical and left UE due to most intense/limiting pain in these areas. She presents with significant palpable trigger points and soft tissue restrictions of left upper trap, cervical paraspinals, rhomboids, and thoracic paraspinals. Also demonstrates significant hypomobility with CPAs C4-T5. Patient with poor lifting tolerance and difficulty performing work and household chores. Patient requires skilled PT services to address deficits and return to prior level of function.   OBJECTIVE IMPAIRMENTS: decreased endurance, difficulty walking, decreased ROM, and decreased strength.   ACTIVITY LIMITATIONS: carrying, lifting, bending, standing, squatting, and caring for others  PARTICIPATION LIMITATIONS: meal prep, driving, shopping, community activity, and occupation  PERSONAL FACTORS: Age, Past/current experiences, and Time since onset of injury/illness/exacerbation are also affecting patient's functional outcome.   REHAB POTENTIAL: Good  CLINICAL DECISION MAKING: Evolving/moderate  complexity  EVALUATION COMPLEXITY: Moderate   GOALS: Goals reviewed with patient? No  SHORT TERM GOALS: Target date: 03/05/2024   Patient will be independent with HEP to improve carryover of symptoms Baseline: See above Goal status: MET  2.  Patient NDI score of 14/50 to show improved tolerance to activity Baseline: 19/50 03/24/24: 13/50 Goal status: MET  3.  Patient will be able to walk and stand at least 45 minutes without pain to perform work and recreational tasks Baseline: Limited to 20 minutes before increase in pain Goal status: MET per patient report 03/24/24   LONG TERM GOALS: Target date: 04/02/2024   Patient will be able to lift 30 lbs from floor to be able to perform work tasks at [employer] Baseline: Pain with lifting and poor squatting mechanics 03/24/2024: able to lift 20lb comfortably  03/30/2024: able to lift and carry 30lb KB about 4 feet from floor to waist height x 3 Goal status: MET  2.  Patient will be able walk greater than 3 hours without pain to be able to perform work and recreational tasks Baseline: Pain after 20 minutes 03/25/2023: 2-2.5 hours comfortable  Goal status: nearly MET   3.  Patient will be able to transition from floor to standing without pain to be able to perform child care tasks Baseline: Significant pain with transitions  03/24/2024: able  Goal status: MET  PLAN:  PT FREQUENCY: 2x/week  PT DURATION: 10 weeks  PLANNED INTERVENTIONS: 97164- PT Re-evaluation, 97750- Physical Performance Testing, 97110-Therapeutic exercises, 97530- Therapeutic activity, 97112- Neuromuscular re-education, 97535- Self Care, [id number]- Manual therapy, [phone]- Gait training, [phone]- Aquatic Therapy, Patient/Family education, Balance training, Stair training, Taping, Joint mobilization, Joint manipulation, Spinal manipulation, Spinal mobilization, Cryotherapy, and Moist heat.    Marko Molt, PT, DPT  04/04/2024 9:22 AM

## 2024-04-07 ENCOUNTER — Encounter: Payer: Self-pay | Admitting: Obstetrics & Gynecology

## 2024-04-07 ENCOUNTER — Other Ambulatory Visit: Payer: Self-pay

## 2024-04-07 ENCOUNTER — Ambulatory Visit (INDEPENDENT_AMBULATORY_CARE_PROVIDER_SITE_OTHER): Admitting: Obstetrics & Gynecology

## 2024-04-07 ENCOUNTER — Other Ambulatory Visit (HOSPITAL_COMMUNITY)
Admission: RE | Admit: 2024-04-07 | Discharge: 2024-04-07 | Disposition: A | Discharge status: Home / Self Care | Source: Ambulatory Visit | Attending: Obstetrics & Gynecology | Admitting: Obstetrics & Gynecology

## 2024-04-07 VITALS — BP 139/97 | HR 80 | Wt 206.0 lb

## 2024-04-07 DIAGNOSIS — N939 Abnormal uterine and vaginal bleeding, unspecified: Secondary | ICD-10-CM | POA: Insufficient documentation

## 2024-04-07 DIAGNOSIS — Z1231 Encounter for screening mammogram for malignant neoplasm of breast: Secondary | ICD-10-CM | POA: Diagnosis not present

## 2024-04-07 LAB — CERVICOVAGINAL ANCILLARY ONLY
Bacterial Vaginitis (gardnerella): NEGATIVE
Candida Glabrata: NEGATIVE
Candida Vaginitis: NEGATIVE
Chlamydia: NEGATIVE
Comment: NEGATIVE
Comment: NEGATIVE
Comment: NEGATIVE
Comment: NEGATIVE
Comment: NEGATIVE
Comment: NORMAL
Neisseria Gonorrhea: NEGATIVE
Trichomonas: NEGATIVE

## 2024-04-07 MED ORDER — MEGESTROL ACETATE 40 MG PO TABS
40.0000 mg | ORAL_TABLET | Freq: Every day | ORAL | 5 refills | Status: DC
  Filled 2024-04-07: qty 30, 30d supply, fill #0

## 2024-04-07 NOTE — Progress Notes (Signed)
 GYNECOLOGY OFFICE VISIT NOTE  History:  Candice [name] is a 38 y.o. [phone] here today for evaluation and management of abnormal uterine bleeding over the last two months.  Had bleeding that lasted for one month, then it came back again and lasted for over a week. It stopped for a few days, and came back again on 03/31/24, she has been having bleeding since.  Has spotting currently. Feels dizzy and tired, worried about anemia. She denies any abnormal vaginal discharge, pelvic pain or other concerns.  Past Medical History:  Diagnosis Date   Abnormal MRI, spine 04/06/2020   Alpha thalassemia silent carrier 11/28/2022   No show genetic counseling virtual visit 6/3 - reschedule if desired     FOB declined testing.      Arthralgia 02/18/2022   Back pain 10/13/2011   Complication of anesthesia    epidural with last pregnancy made entire left side numb, had to d/c   Depression    GDM (gestational diabetes mellitus) 02/11/2023   GERD (gastroesophageal reflux disease) 05/21/2020   Gestational hypertension    History of reversal of tubal ligation 03/17/2023   HIV-1 (human immunodeficiency virus I) (HCC)    HSV 06/28/2007   Major depressive disorder, recurrent episode, moderate (HCC) 06/08/2013   Migraines    Multiple sclerosis    Right leg numbness 02/07/2020   Vitamin D  deficiency 06/20/2022    Past Surgical History:  Procedure Laterality Date   BREAST SURGERY  2019   reduction   CESAREAN SECTION     CESAREAN SECTION  09/07/2011   Procedure: CESAREAN SECTION;  Surgeon: Vonn VEAR Inch, MD;  Location: [facility];  Service: Gynecology;  Laterality: N/A;  Repeat cesarean section with delivery of baby boy at 71. Apgars 9/9.  Bilateral tubal ligation.   CESAREAN SECTION N/A 04/25/2023   Procedure: CESAREAN SECTION;  Surgeon: Nicholaus Burnard HERO, MD;  Location: [facility] LD ORS;  Service: Obstetrics;  Laterality: N/A;   IUD REMOVAL  2005   in cervix   TUBAL LIGATION     tubal reanastomosis  08/13/2016     The following portions of the patient's history were reviewed and updated as appropriate: allergies, current medications, past family history, past medical history, past social history, past surgical history and problem list.   Health Maintenance:  Normal pap and negative HRHPV on 10/25/2020.    Review of Systems:  Pertinent items noted in HPI and remainder of comprehensive ROS otherwise negative.  Physical Exam:  BP (!) 139/97   Pulse 80   Wt 206 lb (93.4 kg)   LMP 03/31/2024   Breastfeeding No   BMI 37.68 kg/m  CONSTITUTIONAL: Well-developed, well-nourished female in no acute distress.  HEENT:  Normocephalic, atraumatic. External right and left ear normal. No scleral icterus.  NECK: Normal range of motion, supple, no masses noted on observation SKIN: No rash noted. Not diaphoretic. No erythema. No pallor. MUSCULOSKELETAL: Normal range of motion. No edema noted. NEUROLOGIC: Alert and oriented to person, place, and time. Normal muscle tone coordination. No cranial nerve deficit noted on observation. PSYCHIATRIC: Normal mood and affect. Normal behavior. Normal judgment and thought content. CARDIOVASCULAR: Normal heart rate noted RESPIRATORY: Effort and breath sounds normal, no problems with respiration noted ABDOMEN: No masses or other overt distention noted on observation. No tenderness.   PELVIC: Normal appearing external genitalia; normal urethral meatus; normal appearing vaginal mucosa and cervix.   Scant brown discharge noted, testing sample obtained.  Pap smear done. Normal uterine size, no  other palpable masses, no uterine or adnexal tenderness. Performed in the presence of a chaperone.   Assessment and Plan:     1. Breast cancer screening by mammogram Mammogram scheduled for patient. - MM 3D SCREENING MAMMOGRAM BILATERAL BREAST; Future  2. Abnormal uterine bleeding (AUB) (Primary) Patient has abnormal uterine bleeding . She has a normal exam, no evidence of lesions.  Will  order abnormal uterine bleeding evaluation labs and pelvic ultrasound to evaluate for any structural gynecologic abnormalities.  Will contact patient with these results and plans for further evaluation/management.  For now, discussed management of her AUB with progesterone.  Risks and benefits discussed, she agreed to this.  Megace ordered for patient, bleeding precautions emphasized. - CBC - Iron, TIBC and Ferritin Panel - TSH Rfx on Abnormal to Free T4 - Beta hCG quant (ref lab) - Cytology - PAP - Cervicovaginal ancillary only - US  PELVIC COMPLETE WITH TRANSVAGINAL; Future - megestrol (MEGACE) 40 MG tablet; Take 1 tablet (40 mg total) by mouth daily. Can increase to two tablets daily in the event of heavy bleeding  Dispense: 30 tablet; Refill: 5   Routine preventative health maintenance measures emphasized. Please refer to After Visit Summary for other counseling recommendations.   Return in about 4 weeks (around 05/05/2024) for Follow up AUB.    I spent 45 minutes dedicated to the care of this patient including pre-visit review of records, face to face time with the patient discussing her conditions and treatments, post visit ordering of medications and appropriate tests or procedures, coordinating care and documenting this visit encounter.    GLORIS HUGGER, MD, FACOG Obstetrician & Gynecologist, [facility] for [employer], [facility]

## 2024-04-08 ENCOUNTER — Other Ambulatory Visit: Payer: Self-pay

## 2024-04-08 ENCOUNTER — Ambulatory Visit: Payer: Self-pay | Admitting: Obstetrics & Gynecology

## 2024-04-08 DIAGNOSIS — N939 Abnormal uterine and vaginal bleeding, unspecified: Secondary | ICD-10-CM

## 2024-04-08 DIAGNOSIS — E611 Iron deficiency: Secondary | ICD-10-CM

## 2024-04-08 LAB — CYTOLOGY - PAP
Comment: NEGATIVE
Diagnosis: NEGATIVE
High risk HPV: NEGATIVE

## 2024-04-08 LAB — IRON,TIBC AND FERRITIN PANEL
Ferritin: 30 ng/mL (ref 15–150)
Iron Saturation: 10 % — ABNORMAL LOW (ref 15–55)
Iron: 31 ug/dL (ref 27–159)
Total Iron Binding Capacity: 296 ug/dL (ref 250–450)
UIBC: 265 ug/dL (ref 131–425)

## 2024-04-08 LAB — CBC
Hematocrit: 42.4 % (ref 34.0–46.6)
Hemoglobin: 13.2 g/dL (ref 11.1–15.9)
MCH: 26.4 pg — ABNORMAL LOW (ref 26.6–33.0)
MCHC: 31.1 g/dL — ABNORMAL LOW (ref 31.5–35.7)
MCV: 85 fL (ref 79–97)
Platelets: 401 x10E3/uL (ref 150–450)
RBC: 5 x10E6/uL (ref 3.77–5.28)
RDW: 15.9 % — ABNORMAL HIGH (ref 11.7–15.4)
WBC: 10.6 x10E3/uL (ref 3.4–10.8)

## 2024-04-08 LAB — TSH RFX ON ABNORMAL TO FREE T4: TSH: 0.639 u[IU]/mL (ref 0.450–4.500)

## 2024-04-08 LAB — BETA HCG QUANT (REF LAB): hCG Quant: <1 m[IU]/mL

## 2024-04-08 MED ORDER — FERROUS SULFATE 325 (65 FE) MG PO TABS
325.0000 mg | ORAL_TABLET | ORAL | 3 refills | Status: DC
  Filled 2024-04-08: qty 45, 90d supply, fill #0

## 2024-04-13 ENCOUNTER — Other Ambulatory Visit: Payer: Self-pay

## 2024-04-13 NOTE — Progress Notes (Unsigned)
 Ben Jackson D.[facility] [address] [place] [address] Phone: [phone]   Assessment and Plan:     1. Neck pain (Primary) 2. Chronic bilateral low back pain without sciatica 3. Strain of right trapezius muscle, initial encounter -Chronic with exacerbation, subsequent visit - Overall improvement in flare of left-sided back and trapezius strain, however patient experiencing right trapezius strain since previous office visit.  Most consistent with muscular dysfunction - Start meloxicam  15 mg daily x2 weeks.  If still having pain after 2 weeks, complete 3rd-week of NSAID. May use remaining NSAID as needed once daily for pain control.  Do not to use additional over-the-counter NSAIDs (ibuprofen , naproxen , Advil , Aleve , etc.) while taking prescription NSAIDs.  May use Tylenol  [phone] mg 2 to 3 times a day for breakthrough pain.  -Continue physical therapy and HEP - Patient has had benefit from trigger point injections in the past and elected for repeat trigger point injections today.  Tolerated well per note below  Trigger Point Injection: After informed consent was obtained, skin cleaned with alcohol  prep.  A total of 5 trigger points identified along right trapezius, right paraspinal, right rhomboid, left rhomboid.  Injections given over area of pain for total injection of 1 mg methylprednisone 40 mg/ML and 4 ml lidocaine  1% w/o epi.  Patient had relief after the injection without side effects.  Pt given signs of infection to watch for.    Pertinent previous records reviewed include none  Follow Up: 4 to 6 weeks for reevaluation.  Could consider repeat OMT versus lidocaine  trigger point injections   Subjective:   I, Danira Nylander, am serving as a Neurosurgeon for Doctor Morene Mace  Chief Complaint: thoracic back pain    HPI:    02/17/2022 Patient is a 38 year old female complaining of thoracic back pain. Patient states  Beginning around 2018, she  began having intermittent episodes of numbness, weakness and dizziness.  In May or June of that year, she began having a recurrence of symptoms, including right flank pain and numbness radiating down her right leg with some associated right leg weakness.  She felt off balance.  She saw neurology at that time.  MRI of thoracic spine revealed subtle enhancing hyperintense focus at the T8-T9 region.would like to discuss OMT    03/10/2022 Patient states that she is alright, left side back is still tight and shoulders    04/07/2022 Patient states that she is okay left side is still crazy even with PT   05/05/2022 Patient states she is good needs a tune up   06/02/2022 Patient states that she is pretty good    06/30/2022 Patient states left side is a problem , PT told her to re check rotator cuff   07/22/2022 Patient states she is pretty good, CSI was a life saver    09/09/2022 Patient states that she has been having intermittent flares    10/07/2022 Patient states that she would still like to get OMT while pregnant    11/04/2022 Patient states left side is  jacked up again    11/27/2022 Patient states shoulder neck and regular adjustment    01/02/2023 Patient states back is flared , but other than that pretty good    01/30/2023 Patient states left side is starting to act up and spasm    02/09/2023 Patient states that her shoulder pain has started coming back in the last 2-3 weeks and it is burning.   02/27/2023 Patient  states that she is decent. Left sided neck tightness    09/16/2023 Patient states wants to discuss CSI options. And ready for an adjustment    10/22/2023 Patient states shots helped. Low back and neck are tight    11/19/2023 Patient states left side low back is flared    12/24/2023 Patient states ok but hurting a little bit. The whole back and the right arm. Right arm has been swollen, elbow has been hurting, and she has been wearing a brace. Some swelling in the right hand  as well. No injury to the arm. 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    01/21/2024 Patient states she is feeling some better    02/18/2024 Patient states she is decent. Left side is flared    03/17/2024 Patient states  she a little tight right now   04/14/2024 Patient states she is doing alright . Right side tightness upper trap    Relevant Historical Information: Multiple sclerosis  Additional pertinent review of systems negative.  Current Outpatient Medications  Medication Sig Dispense Refill   acetaminophen  (TYLENOL ) 500 MG tablet Take 500 mg by mouth every 6 (six) hours as needed for mild pain. (Patient not taking: Reported on 04/07/2024)     cabotegravir  & rilpivirine  ER (CABENUVA ) 600 & 900 MG/3ML injection Inject 1 kit into the muscle every 2 (two) months. 6 mL 5   Cholecalciferol  (VITAMIN D ) 125 MCG (5000 UT) CAPS Take 5,000 Units by mouth daily. (Patient not taking: Reported on 04/07/2024)     ferrous sulfate  325 (65 FE) MG tablet Take 1 tablet (325 mg total) by mouth every other day. 30 tablet 3   megestrol (MEGACE) 40 MG tablet Take 1 tablet (40 mg total) by mouth daily. Can increase to two tablets daily in the event of heavy bleeding 30 tablet 5   meloxicam  (MOBIC ) 15 MG tablet Take 1 tablet (15 mg total) by mouth as needed for pain. (Patient not taking: Reported on 04/07/2024) 30 tablet 0   sertraline  (ZOLOFT ) 25 MG tablet Take 1 tablet (25 mg total) by mouth daily. (Patient not taking: Reported on 04/07/2024) 30 tablet 2   No current facility-administered medications for this visit.      Objective:     Vitals:   04/14/24 0824  BP: 122/68  Weight: 205 lb (93 kg)  Height: 5' 2 (1.575 m)      Body mass index is 37.49 kg/m.    Physical Exam:      Gen: Appears well, nad, nontoxic and pleasant Psych: Alert and oriented, appropriate mood and affect Neuro: sensation intact, strength is 5/5 in upper and lower  extremities, muscle tone wnl Skin: no susupicious lesions or rashes   Back - Normal skin, Spine with normal alignment and no deformity.   No tenderness to vertebral process palpation.   Paraspinous muscles are not tender and without spasm TTP right trapezius, bilateral thoracic and lumbar paraspinal muscles, and left-sided inferior scapular musculature NTTP gluteal musculature  Gait normal    Electronically signed by:  Odis Mace D[facility] 8:42 AM 04/14/24

## 2024-04-14 ENCOUNTER — Ambulatory Visit (HOSPITAL_COMMUNITY)
Admission: RE | Admit: 2024-04-14 | Discharge: 2024-04-14 | Disposition: A | Discharge status: Home / Self Care | Source: Ambulatory Visit | Attending: Obstetrics & Gynecology | Admitting: Obstetrics & Gynecology

## 2024-04-14 ENCOUNTER — Ambulatory Visit: Admitting: Sports Medicine

## 2024-04-14 VITALS — BP 122/68 | Ht 62.0 in | Wt 205.0 lb

## 2024-04-14 DIAGNOSIS — M542 Cervicalgia: Secondary | ICD-10-CM

## 2024-04-14 DIAGNOSIS — S46811A Strain of other muscles, fascia and tendons at shoulder and upper arm level, right arm, initial encounter: Secondary | ICD-10-CM

## 2024-04-14 DIAGNOSIS — M545 Low back pain, unspecified: Secondary | ICD-10-CM

## 2024-04-14 DIAGNOSIS — N939 Abnormal uterine and vaginal bleeding, unspecified: Secondary | ICD-10-CM | POA: Diagnosis not present

## 2024-04-14 DIAGNOSIS — G8929 Other chronic pain: Secondary | ICD-10-CM

## 2024-04-14 NOTE — Patient Instructions (Signed)
4-6 week follow up

## 2024-04-15 DIAGNOSIS — N939 Abnormal uterine and vaginal bleeding, unspecified: Secondary | ICD-10-CM | POA: Insufficient documentation

## 2024-04-20 ENCOUNTER — Ambulatory Visit
Admission: RE | Admit: 2024-04-20 | Discharge: 2024-04-20 | Disposition: A | Source: Ambulatory Visit | Attending: Obstetrics & Gynecology | Admitting: Obstetrics & Gynecology

## 2024-04-20 DIAGNOSIS — Z1231 Encounter for screening mammogram for malignant neoplasm of breast: Secondary | ICD-10-CM

## 2024-04-25 ENCOUNTER — Other Ambulatory Visit: Payer: Self-pay | Admitting: Obstetrics & Gynecology

## 2024-04-25 DIAGNOSIS — R928 Other abnormal and inconclusive findings on diagnostic imaging of breast: Secondary | ICD-10-CM

## 2024-04-28 ENCOUNTER — Other Ambulatory Visit (HOSPITAL_COMMUNITY): Payer: Self-pay

## 2024-04-28 ENCOUNTER — Other Ambulatory Visit: Payer: Self-pay

## 2024-04-28 ENCOUNTER — Other Ambulatory Visit: Payer: Self-pay | Admitting: Pharmacist

## 2024-04-28 DIAGNOSIS — Z21 Asymptomatic human immunodeficiency virus [HIV] infection status: Secondary | ICD-10-CM

## 2024-04-28 MED ORDER — CABOTEGRAVIR & RILPIVIRINE ER 600 & 900 MG/3ML IM SUER
1.0000 | INTRAMUSCULAR | 5 refills | Status: AC
  Filled 2024-04-28: qty 6, 60d supply, fill #0
  Filled 2024-07-04: qty 6, 60d supply, fill #1

## 2024-04-28 NOTE — Progress Notes (Signed)
 Specialty Pharmacy Refill Coordination Note  VARSHA KNOCK is a 38 y.o. female assessed today regarding refills of clinic administered specialty medication(s) Cabotegravir  & Rilpivirine  (CABENUVA )   Clinic requested Courier to Provider Office   Delivery date: 05/09/24   Verified address: [address] [place] [address]   Medication will be filled on 05/06/24.

## 2024-05-04 ENCOUNTER — Other Ambulatory Visit: Payer: Self-pay | Admitting: Obstetrics & Gynecology

## 2024-05-04 ENCOUNTER — Ambulatory Visit
Admission: RE | Admit: 2024-05-04 | Discharge: 2024-05-04 | Disposition: A | Discharge status: Home / Self Care | Source: Ambulatory Visit | Attending: Obstetrics & Gynecology | Admitting: Obstetrics & Gynecology

## 2024-05-04 DIAGNOSIS — N6315 Unspecified lump in the right breast, overlapping quadrants: Secondary | ICD-10-CM | POA: Diagnosis not present

## 2024-05-04 DIAGNOSIS — R928 Other abnormal and inconclusive findings on diagnostic imaging of breast: Secondary | ICD-10-CM

## 2024-05-04 DIAGNOSIS — N6311 Unspecified lump in the right breast, upper outer quadrant: Secondary | ICD-10-CM

## 2024-05-05 ENCOUNTER — Encounter: Payer: Self-pay | Admitting: Obstetrics and Gynecology

## 2024-05-05 ENCOUNTER — Ambulatory Visit (INDEPENDENT_AMBULATORY_CARE_PROVIDER_SITE_OTHER): Admitting: Obstetrics and Gynecology

## 2024-05-05 ENCOUNTER — Other Ambulatory Visit: Payer: Self-pay

## 2024-05-05 VITALS — BP 118/83 | HR 74 | Wt 199.4 lb

## 2024-05-05 DIAGNOSIS — Z1331 Encounter for screening for depression: Secondary | ICD-10-CM | POA: Diagnosis not present

## 2024-05-05 DIAGNOSIS — N939 Abnormal uterine and vaginal bleeding, unspecified: Secondary | ICD-10-CM

## 2024-05-05 NOTE — Progress Notes (Signed)
 GYNECOLOGY VISIT  Patient name: Candice [name] MRN [id number]  Date of birth: [birth year] Chief Complaint:   Follow-up  History:  Baldo LITTIE Hill did not take the megace due to concerns for weight gain but last 2 menses were about 5-6 days and were close to her baseline menses. Received message regarding results.    The following portions of the patient's history were reviewed and updated as appropriate: allergies, current medications, past family history, past medical history, past social history, past surgical history and problem list.   Health Maintenance:   Last pap     Component Value Date/Time   DIAGPAP  04/07/2024 0835    - Negative for intraepithelial lesion or malignancy (NILM)   DIAGPAP  10/25/2020 0952    - Negative for intraepithelial lesion or malignancy (NILM)   DIAGPAP  10/16/2017 0000    NEGATIVE FOR INTRAEPITHELIAL LESIONS OR MALIGNANCY.   HPVHIGH Negative 04/07/2024 0835   HPVHIGH Negative 10/25/2020 0952   ADEQPAP  04/07/2024 0835    Satisfactory for evaluation; transformation zone component PRESENT.   ADEQPAP  10/25/2020 0952    Satisfactory for evaluation; transformation zone component PRESENT.   ADEQPAP  10/16/2017 0000    Satisfactory for evaluation  endocervical/transformation zone component PRESENT.    Health Maintenance  Topic Date Due   COVID-19 Vaccine (1) Never done   Pneumococcal Vaccine (1 of 2 - PCV) Never done   Hepatitis B Vaccine (1 of 3 - 19+ 3-dose series) Never done   HPV Vaccine (1 - Risk 3-dose SCDM series) Never done   Flu Shot  01/22/2024   Breast Cancer Screening  04/20/2026   Pap with HPV screening  04/07/2029   DTaP/Tdap/Td vaccine (4 - Td or Tdap) 02/05/2033   Hepatitis C Screening  Completed   HIV Screening  Completed   Meningitis B Vaccine  Aged Out      Review of Systems:  Pertinent items are noted in HPI. Comprehensive review of systems was otherwise negative.   Objective:  Physical Exam BP 118/83    Pulse 74   Wt 199 lb 6.4 oz (90.4 kg)   LMP 04/27/2024   BMI 36.47 kg/m    Physical Exam Vitals and nursing note reviewed.  Constitutional:      Appearance: Normal appearance.  HENT:     Head: Normocephalic and atraumatic.  Pulmonary:     Effort: Pulmonary effort is normal.  Skin:    General: Skin is warm and dry.  Neurological:     General: No focal deficit present.     Mental Status: She is alert.  Psychiatric:        Mood and Affect: Mood normal.        Behavior: Behavior normal.        Thought Content: Thought content normal.        Judgment: Judgment normal.      Labs and Imaging IMPRESSION: No significant sonographic abnormality of the uterus or ovaries. If bleeding remains unresponsive to hormonal or medical therapy, sonohysterogram should be considered for focal lesion work-up. (Ref: Radiological Reasoning: Algorithmic Workup of Abnormal Vaginal Bleeding with Endovaginal Sonography and Sonohysterography. AJR 2008; [id number]:D31-26)     Electronically Signed   By: Aliene Lloyd M.D.   On: 04/14/2024 16:32     Assessment & Plan:  1. Abnormal uterine bleeding (AUB) (Primary) Menses have returned to baseline without intervention. Discussed that if bleeding heavy and prolonged again, recommend endometrial biopsy at that time. Discussed EMB  and how it's done and options for in office vs OR sampling. Continue observation for now since menses have returned to baseline. Unclear etiology for menstrual change at this time as work up negative.     Carter Quarry, MD [facility], [facility]

## 2024-05-06 ENCOUNTER — Ambulatory Visit
Admission: RE | Admit: 2024-05-06 | Discharge: 2024-05-06 | Disposition: A | Discharge status: Home / Self Care | Source: Ambulatory Visit | Attending: Obstetrics & Gynecology | Admitting: Obstetrics & Gynecology

## 2024-05-06 ENCOUNTER — Other Ambulatory Visit: Payer: Self-pay | Admitting: Family

## 2024-05-06 ENCOUNTER — Other Ambulatory Visit: Payer: Self-pay

## 2024-05-06 DIAGNOSIS — R928 Other abnormal and inconclusive findings on diagnostic imaging of breast: Secondary | ICD-10-CM

## 2024-05-06 DIAGNOSIS — D241 Benign neoplasm of right breast: Secondary | ICD-10-CM | POA: Diagnosis not present

## 2024-05-06 DIAGNOSIS — N6311 Unspecified lump in the right breast, upper outer quadrant: Secondary | ICD-10-CM

## 2024-05-06 DIAGNOSIS — N6315 Unspecified lump in the right breast, overlapping quadrants: Secondary | ICD-10-CM | POA: Diagnosis not present

## 2024-05-09 ENCOUNTER — Telehealth: Payer: Self-pay

## 2024-05-09 ENCOUNTER — Other Ambulatory Visit: Payer: Self-pay

## 2024-05-09 LAB — SURGICAL PATHOLOGY

## 2024-05-09 MED ORDER — SERTRALINE HCL 25 MG PO TABS
25.0000 mg | ORAL_TABLET | Freq: Every day | ORAL | 2 refills | Status: AC
  Filled 2024-05-09: qty 30, 30d supply, fill #0
  Filled 2024-06-27: qty 30, 30d supply, fill #1

## 2024-05-09 NOTE — Telephone Encounter (Signed)
 Patient restarted on sertraline  in July, okay to refill or does she need appointment?

## 2024-05-09 NOTE — Telephone Encounter (Signed)
 [name]  Patient's medications Cabenuva  have been couriered to [facility] from [facility] and will be administered at the patients appointment on 05/12/24.  Candice [name], CPhT Specialty Pharmacy Patient [facility] Phone: [phone] Fax:  [phone]

## 2024-05-09 NOTE — Telephone Encounter (Signed)
 DDI with meloxicam , patient reported not taking meloxicam  03/2024 and Rx had zero refills.   Shasha Buchbinder, BSN, RN

## 2024-05-11 NOTE — Progress Notes (Unsigned)
 HPI: Candice [name] is a 38 y.o. female who presents to the [facility] pharmacy clinic for Cabenuva  administration.  Referring ID Provider: Cathlyn July, NP  Patient Active Problem List   Diagnosis Date Noted   Abnormal uterine bleeding (AUB) 04/15/2024   History of depression 01/13/2022   MS (multiple sclerosis) 05/21/2020   HIV-1 (human immunodeficiency virus I) (HCC) 09/07/2019   Genital herpes 02/16/2013   Tobacco use 03/31/2009    Patient's Medications  New Prescriptions   No medications on file  Previous Medications   ACETAMINOPHEN  (TYLENOL ) 500 MG TABLET    Take 500 mg by mouth every 6 (six) hours as needed for mild pain.   CABOTEGRAVIR  & RILPIVIRINE  ER (CABENUVA ) 600 & 900 MG/3ML INJECTION    Inject 1 kit into the muscle every 2 (two) months.   CHOLECALCIFEROL  (VITAMIN D ) 125 MCG (5000 UT) CAPS    Take 5,000 Units by mouth daily.   FERROUS SULFATE  325 (65 FE) MG TABLET    Take 1 tablet (325 mg total) by mouth every other day.   MEGESTROL  (MEGACE ) 40 MG TABLET    Take 1 tablet (40 mg total) by mouth daily. Can increase to two tablets daily in the event of heavy bleeding   MELOXICAM  (MOBIC ) 15 MG TABLET    Take 1 tablet (15 mg total) by mouth as needed for pain.   SERTRALINE  (ZOLOFT ) 25 MG TABLET    Take 1 tablet (25 mg total) by mouth daily.  Modified Medications   No medications on file  Discontinued Medications   No medications on file    Allergies: Allergies  Allergen Reactions   Contrast Media [Iodinated Contrast Media] Nausea And Vomiting    Labs: Lab Results  Component Value Date   HIV1RNAQUANT NOT DETECTED 03/10/2024   HIV1RNAQUANT <20 DETECTED (A) 01/08/2024   HIV1RNAQUANT Not Detected 07/20/2023   CD4TABS 630 07/20/2023   CD4TABS 684 09/11/2022   CD4TABS 1,026 11/22/2020    RPR and STI Lab Results  Component Value Date   LABRPR NON REACTIVE 04/23/2023   LABRPR Non Reactive 02/06/2023   LABRPR Non Reactive 09/17/2022   LABRPR NON-REACTIVE  12/13/2021   LABRPR NON-REACTIVE 06/20/2021    STI Results GC CT  04/07/2024  8:35 AM Negative  Negative   04/14/2023  3:14 PM Negative  Negative   09/17/2022 10:00 AM Negative  Negative   08/29/2022  3:43 PM Negative  Negative   02/14/2022  9:13 AM Negative    Negative  Negative    Negative   12/13/2021  9:09 AM Negative    Negative  Negative    Negative   07/29/2021  4:28 PM Negative  Negative   06/20/2021  9:20 AM Negative  Negative   12/21/2020  3:45 PM Negative  Negative   11/22/2020  9:58 AM Negative  Negative   10/25/2020  9:52 AM Negative  Negative   09/05/2019  4:31 PM Negative  Negative   10/16/2017 12:00 AM Negative  Negative   06/08/2017 12:00 AM Negative  Negative   10/14/2016 12:00 AM Negative  Negative   02/15/2016 12:00 AM Negative  Negative   07/13/2015 12:00 AM Negative  Negative   04/13/2015 12:00 AM Negative  Negative   09/24/2013 12:00 AM NG: Negative  CT: Negative   02/11/2011  3:36 PM  NEGATIVE     Hepatitis B Lab Results  Component Value Date   HEPBSAB REACTIVE (A) 09/08/2019   HEPBSAG Negative 09/17/2022   HEPBCAB NON-REACTIVE 09/08/2019  Hepatitis C Lab Results  Component Value Date   HEPCAB NON-REACTIVE 09/08/2019   Hepatitis A Lab Results  Component Value Date   HAV REACTIVE (A) 03/10/2024   Lipids: Lab Results  Component Value Date   CHOL 201 (H) 03/10/2024   TRIG 60 03/10/2024   HDL 52 03/10/2024   CHOLHDL 3.9 03/10/2024   VLDL 10 02/14/2013   LDLCALC 134 (H) 03/10/2024    Target Date: 25th  Assessment: Amyah presents today for her maintenance Cabenuva  injections. Past injections were tolerated well without issues. Last HIV RNA was *** in ***. Doing well with no issues today.  Lab work:  ***oral/urine/rectal cytologies for GC/Chlamydia, RPR ***   Eligible vaccinations: Influenza, Covid, PCV20, Shingrix 1/2, HPV 1/3 ***  Cabenuva : Administered cabotegravir  600mg /72mL in left upper outer quadrant of the gluteal  muscle. Administered rilpivirine  900 mg/3mL in the right upper outer quadrant of the gluteal muscle. No issues with injections. Tata will follow up in 2 months for next set of injections.  Plan: - Cabenuva  injections administered - Next injections scheduled for ***1/19/-1/30 (greg), then 3/18-4/1 - Call with any issues or questions  Feliciano Close, PharmD PGY2 Infectious Diseases Pharmacy Resident

## 2024-05-12 ENCOUNTER — Encounter: Admitting: Pharmacist

## 2024-05-12 DIAGNOSIS — Z113 Encounter for screening for infections with a predominantly sexual mode of transmission: Secondary | ICD-10-CM

## 2024-05-13 ENCOUNTER — Ambulatory Visit: Admitting: Pharmacist

## 2024-05-13 ENCOUNTER — Encounter: Admitting: Pharmacist

## 2024-05-13 ENCOUNTER — Other Ambulatory Visit: Payer: Self-pay

## 2024-05-13 ENCOUNTER — Ambulatory Visit: Admitting: Sports Medicine

## 2024-05-13 DIAGNOSIS — Z21 Asymptomatic human immunodeficiency virus [HIV] infection status: Secondary | ICD-10-CM

## 2024-05-13 MED ORDER — CABOTEGRAVIR & RILPIVIRINE ER 600 & 900 MG/3ML IM SUER
1.0000 | Freq: Once | INTRAMUSCULAR | Status: AC
  Administered 2024-05-13: 1 via INTRAMUSCULAR

## 2024-05-13 NOTE — Progress Notes (Signed)
 HPI: Candice [name] is a 38 y.o. female who presents to the [facility] pharmacy clinic for Cabenuva  administration.  Referring ID Physician: Cathlyn July, NP  Patient Active Problem List   Diagnosis Date Noted   Abnormal uterine bleeding (AUB) 04/15/2024   History of depression 01/13/2022   MS (multiple sclerosis) 05/21/2020   HIV-1 (human immunodeficiency virus I) (HCC) 09/07/2019   Genital herpes 02/16/2013   Tobacco use 03/31/2009    Patient's Medications  New Prescriptions   No medications on file  Previous Medications   ACETAMINOPHEN  (TYLENOL ) 500 MG TABLET    Take 500 mg by mouth every 6 (six) hours as needed for mild pain.   CABOTEGRAVIR  & RILPIVIRINE  ER (CABENUVA ) 600 & 900 MG/3ML INJECTION    Inject 1 kit into the muscle every 2 (two) months.   CHOLECALCIFEROL  (VITAMIN D ) 125 MCG (5000 UT) CAPS    Take 5,000 Units by mouth daily.   FERROUS SULFATE  325 (65 FE) MG TABLET    Take 1 tablet (325 mg total) by mouth every other day.   MEGESTROL  (MEGACE ) 40 MG TABLET    Take 1 tablet (40 mg total) by mouth daily. Can increase to two tablets daily in the event of heavy bleeding   MELOXICAM  (MOBIC ) 15 MG TABLET    Take 1 tablet (15 mg total) by mouth as needed for pain.   SERTRALINE  (ZOLOFT ) 25 MG TABLET    Take 1 tablet (25 mg total) by mouth daily.  Modified Medications   No medications on file  Discontinued Medications   No medications on file    Allergies: Allergies  Allergen Reactions   Contrast Media [Iodinated Contrast Media] Nausea And Vomiting    Past Medical History: Past Medical History:  Diagnosis Date   Abnormal MRI, spine 04/06/2020   Alpha thalassemia silent carrier 11/28/2022   No show genetic counseling virtual visit 6/3 - reschedule if desired     FOB declined testing.      Arthralgia 02/18/2022   Back pain 10/13/2011   Complication of anesthesia    epidural with last pregnancy made entire left side numb, had to d/c   Depression    GDM (gestational  diabetes mellitus) 02/11/2023   GERD (gastroesophageal reflux disease) 05/21/2020   Gestational hypertension    History of reversal of tubal ligation 03/17/2023   HIV-1 (human immunodeficiency virus I) (HCC)    HSV 06/28/2007   Major depressive disorder, recurrent episode, moderate (HCC) 06/08/2013   Migraines    Multiple sclerosis    Right leg numbness 02/07/2020   Vitamin D  deficiency 06/20/2022    Social History: Social History   Socioeconomic History   Marital status: Single    Spouse name: Not on file   Number of children: 2   Years of education: 14   Highest education level: Not on file  Occupational History    Comment: associate professor  Tobacco Use   Smoking status: Every Day    Current packs/day: 0.30    Types: Cigarettes    Passive exposure: Current   Smokeless tobacco: Never   Tobacco comments:    Trying to quit due to pregnancy   Vaping Use   Vaping status: Never Used  Substance and Sexual Activity   Alcohol use: Not Currently    Comment: occasional   Drug use: Not Currently    Types: Marijuana    Comment: patient reports last THC use beginning of April   Sexual activity: Not Currently  Partners: Male    Birth control/protection: None  Other Topics Concern   Not on file  Social History Narrative   Works as Associate Professor.   FOB of both children lives in [place] ; will be some what involved but mother is no longer dating him.   Social Drivers of Corporate Investment Banker Strain: Not on file  Food Insecurity: No Food Insecurity (05/05/2024)   Hunger Vital Sign    Worried About Running Out of Food in the Last Year: Never true    Ran Out of Food in the Last Year: Never true  Transportation Needs: No Transportation Needs (05/05/2024)   PRAPARE - Administrator, Civil Service (Medical): No    Lack of Transportation (Non-Medical): No  Physical Activity: Not on file  Stress: Not on file  Social Connections: Not on file    Labs: Lab  Results  Component Value Date   HIV1RNAQUANT NOT DETECTED 03/10/2024   HIV1RNAQUANT <20 DETECTED (A) 01/08/2024   HIV1RNAQUANT Not Detected 07/20/2023   CD4TABS 630 07/20/2023   CD4TABS 684 09/11/2022   CD4TABS 1,026 11/22/2020    RPR and STI Lab Results  Component Value Date   LABRPR NON REACTIVE 04/23/2023   LABRPR Non Reactive 02/06/2023   LABRPR Non Reactive 09/17/2022   LABRPR NON-REACTIVE 12/13/2021   LABRPR NON-REACTIVE 06/20/2021    STI Results GC CT  04/07/2024  8:35 AM Negative  Negative   04/14/2023  3:14 PM Negative  Negative   09/17/2022 10:00 AM Negative  Negative   08/29/2022  3:43 PM Negative  Negative   02/14/2022  9:13 AM Negative    Negative  Negative    Negative   12/13/2021  9:09 AM Negative    Negative  Negative    Negative   07/29/2021  4:28 PM Negative  Negative   06/20/2021  9:20 AM Negative  Negative   12/21/2020  3:45 PM Negative  Negative   11/22/2020  9:58 AM Negative  Negative   10/25/2020  9:52 AM Negative  Negative   09/05/2019  4:31 PM Negative  Negative   10/16/2017 12:00 AM Negative  Negative   06/08/2017 12:00 AM Negative  Negative   10/14/2016 12:00 AM Negative  Negative   02/15/2016 12:00 AM Negative  Negative   07/13/2015 12:00 AM Negative  Negative   04/13/2015 12:00 AM Negative  Negative   09/24/2013 12:00 AM NG: Negative  CT: Negative   02/11/2011  3:36 PM  NEGATIVE     Hepatitis B Lab Results  Component Value Date   HEPBSAB REACTIVE (A) 09/08/2019   HEPBSAG Negative 09/17/2022   HEPBCAB NON-REACTIVE 09/08/2019   Hepatitis C Lab Results  Component Value Date   HEPCAB NON-REACTIVE 09/08/2019   Hepatitis A Lab Results  Component Value Date   HAV REACTIVE (A) 03/10/2024   Lipids: Lab Results  Component Value Date   CHOL 201 (H) 03/10/2024   TRIG 60 03/10/2024   HDL 52 03/10/2024   CHOLHDL 3.9 03/10/2024   VLDL 10 02/14/2013   LDLCALC 134 (H) 03/10/2024    TARGET DATE:  The 25th of the  month  Assessment: Candice [name] presents today for their maintenance Cabenuva  injections. Initial/past injections were tolerated well without issues. No problems with systemic effects of injections.   Administered cabotegravir  600mg /75mL in left upper outer quadrant of the gluteal muscle. Administered rilpivirine  900 mg/3mL in the right upper outer quadrant of the gluteal muscle. Monitored patient for 10 minutes after injection. Injections were  tolerated well without issue. Patient will follow up in 2 months for next injection.  Eligible for PCV20, Shingles, HPV, and COVID which she continues to decline. Politely declines STI testing today.   Plan: - Administer Cabenuva  injections  - Next injections scheduled for 1/23 with Cathlyn and 3/27 with me  - Call with any issues or questions  Alan Geralds, PharmD, CPP, BCIDP, AAHIVP Clinical Pharmacist Practitioner Infectious Diseases Clinical Pharmacist [facility]

## 2024-05-13 NOTE — Progress Notes (Unsigned)
 [name] [address] [place] [address] Phone: [phone]   Assessment and Plan:     1. Neck pain (Primary) 2. Chronic bilateral low back pain without sciatica 3. Somatic dysfunction of cervical region 4. Somatic dysfunction of thoracic region 5. Somatic dysfunction of lumbar region 6. Somatic dysfunction of rib region 7. Somatic dysfunction of pelvic region -Chronic with exacerbation, subsequent visit - Overall improvement in trapezius pain after trigger point injections, meloxicam  course, restarting vitamin D .  Patient having recurrent pain in neck and low back - Use meloxicam  15 mg daily as needed for breakthrough pain.  Recommend limiting chronic NSAIDs to 1-2 doses per week to prevent long-term side effects. Use Tylenol  500 to 1000 mg tablets 2-3 times a day as needed for day-to-day pain relief.    - Continue physical therapy and HEP - Patient has received relief with OMT in the past.  Elects for repeat OMT today.  Tolerated well per note below. - Decision today to treat with OMT was based on Physical Exam   After verbal consent patient was treated with HVLA (high velocity low amplitude), ME (muscle energy), FPR (flex positional release), ST (soft tissue), PC/PD (Pelvic Compression/ Pelvic Decompression) techniques in cervical, rib, thoracic, lumbar, and pelvic areas. Patient tolerated the procedure well with improvement in symptoms.  Patient educated on potential side effects of soreness and recommended to rest, hydrate, and use Tylenol  as needed for pain control.   Pertinent previous records reviewed include none  Follow Up: 4 to 6 weeks for reevaluation.  Could consider repeat trigger point injections versus OMT   Subjective:   I, Candice [name] am a scribe for Dr. Leonce.    Chief Complaint: thoracic back pain    HPI:    02/17/2022 Patient is a 38 year old female complaining of thoracic back pain. Patient states   Beginning around 2018, she began having intermittent episodes of numbness, weakness and dizziness.  In May or June of that year, she began having a recurrence of symptoms, including right flank pain and numbness radiating down her right leg with some associated right leg weakness.  She felt off balance.  She saw neurology at that time.  MRI of thoracic spine revealed subtle enhancing hyperintense focus at the T8-T9 region.would like to discuss OMT    03/10/2022 Patient states that she is alright, left side back is still tight and shoulders    04/07/2022 Patient states that she is okay left side is still crazy even with PT   05/05/2022 Patient states she is good needs a tune up   06/02/2022 Patient states that she is pretty good    06/30/2022 Patient states left side is a problem , PT told her to re check rotator cuff   07/22/2022 Patient states she is pretty good, CSI was a life saver    09/09/2022 Patient states that she has been having intermittent flares    10/07/2022 Patient states that she would still like to get OMT while pregnant    11/04/2022 Patient states left side is  jacked up again    11/27/2022 Patient states shoulder neck and regular adjustment    01/02/2023 Patient states back is flared , but other than that pretty good    01/30/2023 Patient states left side is starting to act up and spasm    02/09/2023 Patient states that her shoulder pain has started coming back in the last 2-3 weeks and it is burning.  02/27/2023 Patient states that she is decent. Left sided neck tightness    09/16/2023 Patient states wants to discuss CSI options. And ready for an adjustment    10/22/2023 Patient states shots helped. Low back and neck are tight    11/19/2023 Patient states left side low back is flared    12/24/2023 Patient states ok but hurting a little bit. The whole back and the right arm. Right arm has been swollen, elbow has been hurting, and she has been wearing a brace. Some  swelling in the right hand as well. No injury to the arm. 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    01/21/2024 Patient states she is feeling some better    02/18/2024 Patient states she is decent. Left side is flared    03/17/2024 Patient states  she a little tight right now    04/14/2024 Patient states she is doing alright . Right side tightness upper trap   05/17/2024 Patient states that she is decent today. Her shoulders have been giving her issues lately. Started taking her Vitamin D  again and she noticed that things have started to loosen up.    Relevant Historical Information: Multiple sclerosis  Additional pertinent review of systems negative.  Current Outpatient Medications  Medication Sig Dispense Refill   acetaminophen  (TYLENOL ) 500 MG tablet Take 500 mg by mouth every 6 (six) hours as needed for mild pain. (Patient not taking: Reported on 04/07/2024)     cabotegravir  & rilpivirine  ER (CABENUVA ) 600 & 900 MG/3ML injection Inject 1 kit into the muscle every 2 (two) months. 6 mL 5   Cholecalciferol  (VITAMIN D ) 125 MCG (5000 UT) CAPS Take 5,000 Units by mouth daily. (Patient not taking: Reported on 04/07/2024)     ferrous sulfate  325 (65 FE) MG tablet Take 1 tablet (325 mg total) by mouth every other day. 30 tablet 3   megestrol  (MEGACE ) 40 MG tablet Take 1 tablet (40 mg total) by mouth daily. Can increase to two tablets daily in the event of heavy bleeding (Patient not taking: Reported on 05/05/2024) 30 tablet 5   meloxicam  (MOBIC ) 15 MG tablet Take 1 tablet (15 mg total) by mouth as needed for pain. (Patient not taking: Reported on 04/07/2024) 30 tablet 0   sertraline  (ZOLOFT ) 25 MG tablet Take 1 tablet (25 mg total) by mouth daily. 30 tablet 2   No current facility-administered medications for this visit.      Objective:     Vitals:   05/17/24 0839  BP: 110/60  Pulse: 90  Weight: 203 lb (92.1 kg)  Height: 5' 2  (1.575 m)      Body mass index is 37.13 kg/m.    Physical Exam:     General: Well-appearing, cooperative, sitting comfortably in no acute distress.   OMT Physical Exam:  ASIS Compression Test: Positive Right Cervical: TTP paraspinal, C3 RLSR Rib: Bilateral elevated first rib with TTP Thoracic: TTP paraspinal, T6 RRSR Lumbar: TTP paraspinal, L2 RLSL Pelvis: Right anterior innominate  Electronically signed by:  Odis Mace D.[name] 9:23 AM 05/17/24

## 2024-05-17 ENCOUNTER — Ambulatory Visit: Admitting: Sports Medicine

## 2024-05-17 VITALS — BP 110/60 | HR 90 | Ht 62.0 in | Wt 203.0 lb

## 2024-05-17 DIAGNOSIS — M542 Cervicalgia: Secondary | ICD-10-CM

## 2024-05-17 DIAGNOSIS — M9903 Segmental and somatic dysfunction of lumbar region: Secondary | ICD-10-CM | POA: Diagnosis not present

## 2024-05-17 DIAGNOSIS — G8929 Other chronic pain: Secondary | ICD-10-CM | POA: Diagnosis not present

## 2024-05-17 DIAGNOSIS — M545 Low back pain, unspecified: Secondary | ICD-10-CM

## 2024-05-17 DIAGNOSIS — M9901 Segmental and somatic dysfunction of cervical region: Secondary | ICD-10-CM

## 2024-05-17 DIAGNOSIS — M9908 Segmental and somatic dysfunction of rib cage: Secondary | ICD-10-CM

## 2024-05-17 DIAGNOSIS — M9905 Segmental and somatic dysfunction of pelvic region: Secondary | ICD-10-CM

## 2024-05-17 DIAGNOSIS — M9902 Segmental and somatic dysfunction of thoracic region: Secondary | ICD-10-CM

## 2024-06-27 ENCOUNTER — Other Ambulatory Visit: Payer: Self-pay

## 2024-06-27 ENCOUNTER — Other Ambulatory Visit: Payer: Self-pay | Admitting: Sports Medicine

## 2024-06-27 MED ORDER — MELOXICAM 15 MG PO TABS
15.0000 mg | ORAL_TABLET | ORAL | 0 refills | Status: AC | PRN
  Filled 2024-06-27: qty 30, 30d supply, fill #0

## 2024-06-27 MED ORDER — VALACYCLOVIR HCL 1 G PO TABS
1000.0000 mg | ORAL_TABLET | Freq: Two times a day (BID) | ORAL | 2 refills | Status: AC
  Filled 2024-06-27: qty 10, 5d supply, fill #0

## 2024-06-27 NOTE — Progress Notes (Signed)
 48 [name].  He is due after 4 to 6-week  [name] [address] [place] [address] Phone: [phone]   Assessment and Plan:      1. Neck pain (Primary) 2. Chronic bilateral low back pain without sciatica 3. Strain of right trapezius muscle, subsequent encounter 4. Strain of left trapezius muscle, subsequent encounter -Chronic with exacerbation, subsequent visit - Recurrent flare of pain in multiple areas with most prominent through bilateral trapezius, bilateral thoracolumbar pain, worse on left - Use meloxicam  15 mg daily as needed for breakthrough pain.  Recommend limiting chronic NSAIDs to 1-2 doses per week to prevent long-term side effects. Use Tylenol  500 to 1000 mg tablets 2-3 times a day as needed for day-to-day pain relief.    - Patient has benefited from trigger point injections in the past.  Elected for repoint trigger point injections today.  Tolerated well per note below - Continue HEP and physical therapy  Trigger Point Injection: After informed consent was obtained, skin cleaned with alcohol  prep.  A total of 7 trigger points identified along bilateral trapezius, right rhomboid, left rhomboid, left thoracolumbar paraspinal musculature.  Injections given over area of pain for total injection of 1 mL Kenalog 40 mg/ML and 4 ml lidocaine  1% w/o epi.  Patient had relief after the injection without side effects.  Pt given signs of infection to watch for.     Pertinent previous records reviewed include none  Follow Up: 4 to 6 weeks for reevaluation.  Could consider repeat OMT versus lidocaine  trigger point injections   Subjective:   I, Candice [name], am serving as a neurosurgeon for Doctor Morene Mace  Chief Complaint: thoracic back pain    HPI:    02/17/2022 Patient is a 39 year old female complaining of thoracic back pain. Patient states  Beginning around 2018, she began having intermittent episodes of numbness, weakness and  dizziness.  In May or June of that year, she began having a recurrence of symptoms, including right flank pain and numbness radiating down her right leg with some associated right leg weakness.  She felt off balance.  She saw neurology at that time.  MRI of thoracic spine revealed subtle enhancing hyperintense focus at the T8-T9 region.would like to discuss OMT    03/10/2022 Patient states that she is alright, left side back is still tight and shoulders    04/07/2022 Patient states that she is okay left side is still crazy even with PT   05/05/2022 Patient states she is good needs a tune up   06/02/2022 Patient states that she is pretty good    06/30/2022 Patient states left side is a problem , PT told her to re check rotator cuff   07/22/2022 Patient states she is pretty good, CSI was a life saver    09/09/2022 Patient states that she has been having intermittent flares    10/07/2022 Patient states that she would still like to get OMT while pregnant    11/04/2022 Patient states left side is  jacked up again    11/27/2022 Patient states shoulder neck and regular adjustment    01/02/2023 Patient states back is flared , but other than that pretty good    01/30/2023 Patient states left side is starting to act up and spasm    02/09/2023 Patient states that her shoulder pain has started coming back in the last 2-3 weeks and it is burning.   02/27/2023 Patient states that she  is decent. Left sided neck tightness    09/16/2023 Patient states wants to discuss CSI options. And ready for an adjustment    10/22/2023 Patient states shots helped. Low back and neck are tight    11/19/2023 Patient states left side low back is flared    12/24/2023 Patient states ok but hurting a little bit. The whole back and the right arm. Right arm has been swollen, elbow has been hurting, and she has been wearing a brace. Some swelling in the right hand as well. No injury to the arm. 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    01/21/2024 Patient states she is feeling some better    02/18/2024 Patient states she is decent. Left side is flared    03/17/2024 Patient states  she a little tight right now    04/14/2024 Patient states she is doing alright . Right side tightness upper trap    05/17/2024 Patient states that she is decent today. Her shoulders have been giving her issues lately. Started taking her Vitamin D  again and she noticed that things have started to loosen up.   06/28/2024  She is ready for some injections.  Relevant Historical Information: Multiple sclerosis  Additional pertinent review of systems negative.  Current Outpatient Medications  Medication Sig Dispense Refill   acetaminophen  (TYLENOL ) 500 MG tablet Take 500 mg by mouth every 6 (six) hours as needed for mild pain. (Patient not taking: Reported on 04/07/2024)     cabotegravir  & rilpivirine  ER (CABENUVA ) 600 & 900 MG/3ML injection Inject 1 kit into the muscle every 2 (two) months. 6 mL 5   Cholecalciferol  (VITAMIN D ) 125 MCG (5000 UT) CAPS Take 5,000 Units by mouth daily. (Patient not taking: Reported on 04/07/2024)     ferrous sulfate  325 (65 FE) MG tablet Take 1 tablet (325 mg total) by mouth every other day. 30 tablet 3   megestrol  (MEGACE ) 40 MG tablet Take 1 tablet (40 mg total) by mouth daily. Can increase to two tablets daily in the event of heavy bleeding (Patient not taking: Reported on 05/05/2024) 30 tablet 5   meloxicam  (MOBIC ) 15 MG tablet Take 1 tablet (15 mg total) by mouth as needed for pain. 30 tablet 0   sertraline  (ZOLOFT ) 25 MG tablet Take 1 tablet (25 mg total) by mouth daily. 30 tablet 2   valACYclovir  (VALTREX ) 1000 MG tablet Take 1 tablet (1,000 mg total) by mouth 2 (two) times daily. 10 tablet 2   No current facility-administered medications for this visit.      Objective:     Vitals:   06/28/24 0844  BP: 134/86  Weight: 203 lb (92.1 kg)   Height: 5' 2 (1.575 m)      Body mass index is 37.13 kg/m.    Physical Exam:     Gen: Appears well, nad, nontoxic and pleasant Psych: Alert and oriented, appropriate mood and affect Neuro: sensation intact, strength is 5/5 in upper and lower extremities, muscle tone wnl Skin: no susupicious lesions or rashes   Back - Normal skin, Spine with normal alignment and no deformity.   No tenderness to vertebral process palpation.   Bilateral thoracolumbar paraspinal muscles are tender, worse on left compared to right TTP right trapezius,   and left-sided inferior scapular musculature NTTP gluteal musculature  Gait normal    Electronically signed by:  [name] 9:19 AM 06/28/2024

## 2024-06-27 NOTE — Telephone Encounter (Signed)
 Spoke with pt who asked refills be called into [facility] pharmacy.

## 2024-06-28 ENCOUNTER — Ambulatory Visit: Admitting: Sports Medicine

## 2024-06-28 VITALS — BP 134/86 | Ht 62.0 in | Wt 203.0 lb

## 2024-06-28 DIAGNOSIS — M542 Cervicalgia: Secondary | ICD-10-CM

## 2024-06-28 DIAGNOSIS — G8929 Other chronic pain: Secondary | ICD-10-CM

## 2024-06-28 DIAGNOSIS — S46812D Strain of other muscles, fascia and tendons at shoulder and upper arm level, left arm, subsequent encounter: Secondary | ICD-10-CM | POA: Diagnosis not present

## 2024-06-28 DIAGNOSIS — M545 Low back pain, unspecified: Secondary | ICD-10-CM | POA: Diagnosis not present

## 2024-06-28 DIAGNOSIS — S46811D Strain of other muscles, fascia and tendons at shoulder and upper arm level, right arm, subsequent encounter: Secondary | ICD-10-CM

## 2024-06-28 NOTE — Patient Instructions (Signed)
4-6 week follow up

## 2024-06-28 NOTE — Progress Notes (Signed)
 The 10-year ASCVD risk score (Arnett DK, et al., 2019) is: 1.7%   Values used to calculate the score:     Age: 39 years     Clinically relevant sex: Female     Is Non-Hispanic African American: Yes     Diabetic: No     Tobacco smoker: Yes     Systolic Blood Pressure: 134 mmHg     Is BP treated: No     HDL Cholesterol: 52 mg/dL     Total Cholesterol: 201 mg/dL  Duwaine Lowe, BSN, RN

## 2024-07-04 ENCOUNTER — Other Ambulatory Visit: Payer: Self-pay

## 2024-07-04 ENCOUNTER — Other Ambulatory Visit (HOSPITAL_COMMUNITY): Payer: Self-pay

## 2024-07-04 NOTE — Progress Notes (Signed)
 Specialty Pharmacy Refill Coordination Note  Candice [name] is a 39 y.o. female assessed today regarding refills of clinic administered specialty medication(s) Cabotegravir  & Rilpivirine  (CABENUVA )   Clinic requested Courier to Provider Office   Delivery date: 07/11/24   Verified address: [address] [place] [address]   Medication will be filled on 07/08/24.

## 2024-07-08 ENCOUNTER — Other Ambulatory Visit: Payer: Self-pay

## 2024-07-11 ENCOUNTER — Telehealth: Payer: Self-pay

## 2024-07-11 NOTE — Telephone Encounter (Signed)
 [name]  Patient's medications Cabenuva  have been couriered to [facility] from [facility] pharmacy and will be administered at the patients appointment on 07/15/24.  Arland Hutchinson, CPhT Specialty Pharmacy Patient [facility] Phone: [phone] Fax:  [phone]

## 2024-07-12 ENCOUNTER — Ambulatory Visit: Admitting: Family

## 2024-07-15 ENCOUNTER — Encounter: Payer: Self-pay | Admitting: Family

## 2024-07-15 ENCOUNTER — Other Ambulatory Visit: Payer: Self-pay

## 2024-07-15 ENCOUNTER — Ambulatory Visit: Admitting: Family

## 2024-07-15 VITALS — HR 64 | Temp 98.4°F | Resp 16 | Wt 194.2 lb

## 2024-07-15 DIAGNOSIS — E611 Iron deficiency: Secondary | ICD-10-CM

## 2024-07-15 DIAGNOSIS — Z720 Tobacco use: Secondary | ICD-10-CM

## 2024-07-15 DIAGNOSIS — Z79899 Other long term (current) drug therapy: Secondary | ICD-10-CM | POA: Diagnosis not present

## 2024-07-15 DIAGNOSIS — Z0000 Encounter for general adult medical examination without abnormal findings: Secondary | ICD-10-CM

## 2024-07-15 DIAGNOSIS — Z21 Asymptomatic human immunodeficiency virus [HIV] infection status: Secondary | ICD-10-CM | POA: Diagnosis not present

## 2024-07-15 DIAGNOSIS — F17210 Nicotine dependence, cigarettes, uncomplicated: Secondary | ICD-10-CM | POA: Diagnosis not present

## 2024-07-15 MED ORDER — CABOTEGRAVIR & RILPIVIRINE ER 600 & 900 MG/3ML IM SUER
1.0000 | Freq: Once | INTRAMUSCULAR | Status: AC
  Administered 2024-07-15: 1 via INTRAMUSCULAR

## 2024-07-15 NOTE — Assessment & Plan Note (Signed)
 Candice [name] continues to smoke approximately 1/3 pack of cigarettes per day on average and not ready to quit at this time. Counseled on the dangers of tobacco not ready to quit at this time.  Reviewed strategies to maximize success, including removing cigarettes and smoking materials from environment, stress management, substitution of other forms of reinforcement, support of family/friends, and written materials.

## 2024-07-15 NOTE — Progress Notes (Signed)
 "   Brief Narrative   Patient ID: Candice [name], female    DOB: 1985-07-29, 39 y.o.   MRN: [id number]  Candice [name] is a 39 y/o female diagnosed with HIV-1 disease diagnosed in March 2021 with risk factor of heterosexual contact. Initial CD4 count was 757 and viral load of 421. Genotype was Subtype B with no significant medication resistant mutations. No history of opportunistic infection. [id number] negative. Entered care at [facility] Stage 1. Previous ART exposure to Curtis and currently Cabenuva  with target date of the 25th of the month.    Subjective:   Chief Complaint  Patient presents with   Follow-up    B20    HPI:  Candice [name] is a 39 y.o. female with HIV disease last seen on 05/13/2024 by Alan Geralds, PharmD, CPP with well-controlled virus and good adherence and tolerance to Cabenuva .  Last blood work with viral load that was undetectable and CD4 count 1521.  Kidney function, liver function, electrolytes within normal ranges.  Here today for routine follow-up and next injection.  Candice [name] is a doing well since her last office visit and continues to receive Cabenuva  with no adverse side effects.  Covered by [employer].  No new concerns/complaints.  Housing, transportation, and access to food are stable.  Healthcare maintenance reviewed.  Due for routine dental care.  Continues to smoke approximately 1/3 pack of cigarettes per day.  Condoms and site-specific STD testing offered.  Denies fevers, chills, night sweats, headaches, changes in vision, neck pain/stiffness, nausea, diarrhea, vomiting, lesions or rashes.  Lab Results  Component Value Date   CD4TCELL 49 01/08/2024   CD4TABS 630 07/20/2023   Lab Results  Component Value Date   HIV1RNAQUANT NOT DETECTED 03/10/2024     Allergies[1]    Outpatient Medications Prior to Visit  Medication Sig Dispense Refill   acetaminophen  (TYLENOL ) 500 MG tablet Take 500 mg by mouth every 6 (six) hours as needed for  mild pain.     cabotegravir  & rilpivirine  ER (CABENUVA ) 600 & 900 MG/3ML injection Inject 1 kit into the muscle every 2 (two) months. 6 mL 5   meloxicam  (MOBIC ) 15 MG tablet Take 1 tablet (15 mg total) by mouth as needed for pain. 30 tablet 0   sertraline  (ZOLOFT ) 25 MG tablet Take 1 tablet (25 mg total) by mouth daily. 30 tablet 2   valACYclovir  (VALTREX ) 1000 MG tablet Take 1 tablet (1,000 mg total) by mouth 2 (two) times daily. 10 tablet 2   ferrous sulfate  325 (65 FE) MG tablet Take 1 tablet (325 mg total) by mouth every other day. 30 tablet 3   Cholecalciferol  (VITAMIN D ) 125 MCG (5000 UT) CAPS Take 5,000 Units by mouth daily. (Patient not taking: Reported on 07/15/2024)     megestrol  (MEGACE ) 40 MG tablet Take 1 tablet (40 mg total) by mouth daily. Can increase to two tablets daily in the event of heavy bleeding (Patient not taking: Reported on 07/15/2024) 30 tablet 5   No facility-administered medications prior to visit.     Past Medical History:  Diagnosis Date   Abnormal MRI, spine 04/06/2020   Alpha thalassemia silent carrier 11/28/2022   No show genetic counseling virtual visit 6/3 - reschedule if desired     FOB declined testing.      Arthralgia 02/18/2022   Back pain 10/13/2011   Complication of anesthesia    epidural with last pregnancy made entire left side numb, had to d/c  Depression    GDM (gestational diabetes mellitus) 02/11/2023   GERD (gastroesophageal reflux disease) 05/21/2020   Gestational hypertension    History of reversal of tubal ligation 03/17/2023   HIV-1 (human immunodeficiency virus I) (HCC)    HSV 06/28/2007   Major depressive disorder, recurrent episode, moderate (HCC) 06/08/2013   Migraines    Multiple sclerosis    Right leg numbness 02/07/2020   Vitamin D  deficiency 06/20/2022     Past Surgical History:  Procedure Laterality Date   BREAST BIOPSY Right 05/06/2024   US  RT BREAST BX W LOC DEV 1ST LESION IMG BX SPEC US  GUIDE 05/06/2024 [facility]  MAMMOGRAPHY   BREAST SURGERY  2019   reduction   CESAREAN SECTION     CESAREAN SECTION  09/07/2011   Procedure: CESAREAN SECTION;  Surgeon: Vonn VEAR Inch, MD;  Location: [facility];  Service: Gynecology;  Laterality: N/A;  Repeat cesarean section with delivery of baby boy at 55. Apgars 9/9.  Bilateral tubal ligation.   CESAREAN SECTION N/A 04/25/2023   Procedure: CESAREAN SECTION;  Surgeon: Nicholaus Burnard HERO, MD;  Location: [facility];  Service: Obstetrics;  Laterality: N/A;   IUD REMOVAL  2005   in cervix   REDUCTION MAMMAPLASTY     TUBAL LIGATION     tubal reanastomosis  08/13/2016    Review of Systems  Constitutional:  Negative for appetite change, chills, diaphoresis, fatigue, fever and unexpected weight change.  Eyes:        Negative for acute change in vision  Respiratory:  Negative for chest tightness, shortness of breath and wheezing.   Cardiovascular:  Negative for chest pain.  Gastrointestinal:  Negative for diarrhea, nausea and vomiting.  Genitourinary:  Negative for dysuria, pelvic pain and vaginal discharge.  Musculoskeletal:  Negative for neck pain and neck stiffness.  Skin:  Negative for rash.  Neurological:  Negative for seizures, syncope, weakness and headaches.  Hematological:  Negative for adenopathy. Does not bruise/bleed easily.  Psychiatric/Behavioral:  Negative for hallucinations.      Objective:   Pulse 64   Temp 98.4 F (36.9 C) (Oral)   Resp 16   Wt 194 lb 3.2 oz (88.1 kg)   LMP 06/21/2024   SpO2 97%   BMI 35.52 kg/m  Nursing note and vital signs reviewed.  Physical Exam Constitutional:      General: She is not in acute distress.    Appearance: She is well-developed.  Eyes:     Conjunctiva/sclera: Conjunctivae normal.  Cardiovascular:     Rate and Rhythm: Normal rate and regular rhythm.     Heart sounds: Normal heart sounds. No murmur heard.    No friction rub. No gallop.  Pulmonary:     Effort: Pulmonary effort is normal. No respiratory  distress.     Breath sounds: Normal breath sounds. No wheezing or rales.  Chest:     Chest wall: No tenderness.  Musculoskeletal:     Cervical back: Neck supple.  Lymphadenopathy:     Cervical: No cervical adenopathy.  Skin:    General: Skin is warm and dry.     Findings: No rash.  Neurological:     Mental Status: She is alert.          05/05/2024    5:05 PM 04/07/2024    8:32 AM 01/08/2024   11:26 AM 07/20/2023    9:51 AM 02/23/2023    9:33 AM  Depression screen PHQ 2/9  Decreased Interest 0 0 1 0 0  Down, Depressed, Hopeless 0 0 1 0 0  PHQ - 2 Score 0 0 2 0 0  Altered sleeping 1 1 0    Tired, decreased energy 1 1 3     Change in appetite 2 1 3     Feeling bad or failure about yourself  0 0 1    Trouble concentrating 0 0 0    Moving slowly or fidgety/restless 0 0 0    Suicidal thoughts 0 0 0    PHQ-9 Score 4 3  9      Difficult doing work/chores   Not difficult at all       Data saved with a previous flowsheet row definition        05/05/2024    5:06 PM 04/07/2024    8:34 AM 01/08/2024   11:27 AM 11/10/2022    3:50 PM  GAD 7 : Generalized Anxiety Score  Nervous, Anxious, on Edge 1  1  1  1    Control/stop worrying 1  1  1  2    Worry too much - different things 1  1  1  3    Trouble relaxing 0  1  2  2    Restless 0  0  0  0   Easily annoyed or irritable 0  0  3  1   Afraid - awful might happen 0  0  3  0   Total GAD 7 Score 3 4 11 9   Anxiety Difficulty   Somewhat difficult      Data saved with a previous flowsheet row definition     The 10-year ASCVD risk score (Arnett DK, et al., 2019) is: 1.7%   Values used to calculate the score:     Age: 7 years     Clinically relevant sex: Female     Is Non-Hispanic African American: Yes     Diabetic: No     Tobacco smoker: Yes     Systolic Blood Pressure: 134 mmHg     Is BP treated: No     HDL Cholesterol: 52 mg/dL     Total Cholesterol: 201 mg/dL      Assessment & Plan:    Patient Active Problem List    Diagnosis Date Noted   Abnormal uterine bleeding (AUB) 04/15/2024   History of depression 01/13/2022   MS (multiple sclerosis) 05/21/2020   Healthcare maintenance 02/10/2020   HIV-1 (human immunodeficiency virus I) (HCC) 09/07/2019   Genital herpes 02/16/2013   Tobacco use 03/31/2009     Problem List Items Addressed This Visit       Other   Tobacco use   Candice [name] continues to smoke approximately 1/3 pack of cigarettes per day on average and not ready to quit at this time. Counseled on the dangers of tobacco not ready to quit at this time.  Reviewed strategies to maximize success, including removing cigarettes and smoking materials from environment, stress management, substitution of other forms of reinforcement, support of family/friends, and written materials.      HIV-1 (human immunodeficiency virus I) (HCC) - Primary   Candice [name] continues to have well-controlled virus with good adherence and tolerance to Cabenuva .  Reviewed previous lab work and discussed plan of care and U equals U.  Covered by [employer].  Social determinants of health reviewed with no interventions indicated.  Check blood work.  Continue current dose of Cabenuva  which was administered without complication.  Plan for follow-up in 6 months or sooner if needed  with pharmacy providers in between.      Relevant Orders   Comprehensive metabolic panel with GFR   HIV-1 RNA quant-no reflex-bld   T-helper cells (CD4) count (not at [facility])   Healthcare maintenance   Discussed importance of safe sexual practice and condom use. Condoms and site specific STD testing offered.  Vaccinations reviewed and declined following counseling. Due for routine dental care which she will schedule.  Breast cancer and cervical cancer screening up to date.       Other Visit Diagnoses       Iron deficiency       Relevant Orders   Iron, TIBC and Ferritin Panel   CBC        I have discontinued Candice [name]'s  megestrol  and ferrous sulfate . I am also having her maintain her acetaminophen , Vitamin D , cabotegravir  & rilpivirine  ER, sertraline , meloxicam , and valACYclovir . We administered cabotegravir  & rilpivirine  ER.   Meds ordered this encounter  Medications   cabotegravir  & rilpivirine  ER (CABENUVA ) 600 & 900 MG/3ML injection 1 kit     Follow-up: Return in about 6 months (around 01/12/2025). or sooner if needed.    Cathlyn July, MSN, FNP-C Nurse Practitioner [facility] for Infectious Disease [facility] Main number: [phone]      [1]  Allergies Allergen Reactions   Contrast Media [Iodinated Contrast Media] Nausea And Vomiting   "

## 2024-07-15 NOTE — Patient Instructions (Addendum)
 Nice to see you.  We will check your lab work today.  Continue to take your medication daily as prescribed.  Refills have been sent to the pharmacy.  Plan for follow up in 6 months or sooner if needed with lab work on the same day with pharmacy team in between.   Have a great day and stay safe!

## 2024-07-15 NOTE — Assessment & Plan Note (Signed)
 Candice [name] continues to have well-controlled virus with good adherence and tolerance to Cabenuva .  Reviewed previous lab work and discussed plan of care and U equals U.  Covered by [employer].  Social determinants of health reviewed with no interventions indicated.  Check blood work.  Continue current dose of Cabenuva  which was administered without complication.  Plan for follow-up in 6 months or sooner if needed with pharmacy providers in between.

## 2024-07-15 NOTE — Assessment & Plan Note (Signed)
 Discussed importance of safe sexual practice and condom use. Condoms and site specific STD testing offered.  Vaccinations reviewed and declined following counseling. Due for routine dental care which she will schedule.  Breast cancer and cervical cancer screening up to date.

## 2024-07-19 ENCOUNTER — Ambulatory Visit: Payer: Self-pay | Admitting: Family

## 2024-07-19 LAB — COMPREHENSIVE METABOLIC PANEL WITH GFR
AG Ratio: 1.4 (calc) (ref 1.0–2.5)
ALT: 9 U/L (ref 6–29)
AST: 9 U/L — ABNORMAL LOW (ref 10–30)
Albumin: 3.9 g/dL (ref 3.6–5.1)
Alkaline phosphatase (APISO): 50 U/L (ref 31–125)
BUN: 11 mg/dL (ref 7–25)
CO2: 28 mmol/L (ref 20–32)
Calcium: 9.1 mg/dL (ref 8.6–10.2)
Chloride: 101 mmol/L (ref 98–110)
Creat: 0.61 mg/dL (ref 0.50–0.99)
Globulin: 2.8 g/dL (ref 1.9–3.7)
Glucose, Bld: 88 mg/dL (ref 65–99)
Potassium: 3.7 mmol/L (ref 3.5–5.3)
Sodium: 137 mmol/L (ref 135–146)
Total Bilirubin: 0.3 mg/dL (ref 0.2–1.2)
Total Protein: 6.7 g/dL (ref 6.1–8.1)
eGFR: 116 mL/min/{1.73_m2}

## 2024-07-19 LAB — CBC
HCT: 38.2 % (ref 35.9–46.0)
Hemoglobin: 12.3 g/dL (ref 11.7–15.5)
MCH: 27 pg (ref 27.0–33.0)
MCHC: 32.2 g/dL (ref 31.6–35.4)
MCV: 84 fL (ref 81.4–101.7)
MPV: 9.6 fL (ref 7.5–12.5)
Platelets: 353 10*3/uL (ref 140–400)
RBC: 4.55 Million/uL (ref 3.80–5.10)
RDW: 15.8 % — ABNORMAL HIGH (ref 11.0–15.0)
WBC: 12.4 10*3/uL — ABNORMAL HIGH (ref 3.8–10.8)

## 2024-07-19 LAB — IRON,TIBC AND FERRITIN PANEL
%SAT: 14 % — ABNORMAL LOW (ref 16–45)
Ferritin: 12 ng/mL — ABNORMAL LOW (ref 16–154)
Iron: 41 ug/dL (ref 40–190)
TIBC: 302 ug/dL (ref 250–450)

## 2024-07-19 LAB — HIV-1 RNA QUANT-NO REFLEX-BLD
HIV 1 RNA Quant: <20 {copies}/mL — AB
HIV-1 RNA Quant, Log: <1.3 {Log_copies}/mL — AB

## 2024-07-19 LAB — T-HELPER CELLS (CD4) COUNT (NOT AT ARMC)
Absolute CD4: 972 {cells}/uL (ref 490–1740)
CD4 T Helper %: 47 % (ref 30–61)
Total lymphocyte count: 2079 {cells}/uL (ref 850–3900)

## 2024-07-26 ENCOUNTER — Ambulatory Visit: Admitting: Sports Medicine

## 2024-09-16 ENCOUNTER — Encounter: Payer: Self-pay | Admitting: Pharmacist
